# Patient Record
Sex: Male | Born: 1955 | Race: White | Hispanic: No | Marital: Single | Smoking: Former smoker
Health system: Southern US, Community
[De-identification: ages and names within clinical notes are randomized; demographics above are authoritative.]

## PROBLEM LIST (undated history)

## (undated) DIAGNOSIS — E1122 Type 2 diabetes mellitus with diabetic chronic kidney disease: Secondary | ICD-10-CM

## (undated) DIAGNOSIS — C801 Malignant (primary) neoplasm, unspecified: Secondary | ICD-10-CM

## (undated) DIAGNOSIS — C61 Malignant neoplasm of prostate: Secondary | ICD-10-CM

## (undated) DIAGNOSIS — Z993 Dependence on wheelchair: Secondary | ICD-10-CM

## (undated) DIAGNOSIS — G4733 Obstructive sleep apnea (adult) (pediatric): Secondary | ICD-10-CM

## (undated) DIAGNOSIS — I10 Essential (primary) hypertension: Secondary | ICD-10-CM

## (undated) DIAGNOSIS — E039 Hypothyroidism, unspecified: Secondary | ICD-10-CM

## (undated) DIAGNOSIS — N186 End stage renal disease: Secondary | ICD-10-CM

## (undated) DIAGNOSIS — E1140 Type 2 diabetes mellitus with diabetic neuropathy, unspecified: Secondary | ICD-10-CM

## (undated) DIAGNOSIS — I639 Cerebral infarction, unspecified: Secondary | ICD-10-CM

## (undated) DIAGNOSIS — M1990 Unspecified osteoarthritis, unspecified site: Secondary | ICD-10-CM

## (undated) DIAGNOSIS — I499 Cardiac arrhythmia, unspecified: Secondary | ICD-10-CM

## (undated) DIAGNOSIS — E785 Hyperlipidemia, unspecified: Secondary | ICD-10-CM

## (undated) DIAGNOSIS — Z9289 Personal history of other medical treatment: Secondary | ICD-10-CM

## (undated) DIAGNOSIS — D649 Anemia, unspecified: Secondary | ICD-10-CM

## (undated) DIAGNOSIS — K759 Inflammatory liver disease, unspecified: Secondary | ICD-10-CM

## (undated) DIAGNOSIS — I509 Heart failure, unspecified: Secondary | ICD-10-CM

## (undated) DIAGNOSIS — I770 Arteriovenous fistula, acquired: Secondary | ICD-10-CM

## (undated) HISTORY — PX: COLONOSCOPY: SHX174

## (undated) HISTORY — PX: APPENDECTOMY: SHX54

---
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
## 2008-06-02 DIAGNOSIS — I639 Cerebral infarction, unspecified: Secondary | ICD-10-CM

## 2008-06-02 HISTORY — DX: Cerebral infarction, unspecified: I63.9

## 2011-05-19 DIAGNOSIS — Z8673 Personal history of transient ischemic attack (TIA), and cerebral infarction without residual deficits: Secondary | ICD-10-CM | POA: Insufficient documentation

## 2012-06-07 DIAGNOSIS — H532 Diplopia: Secondary | ICD-10-CM | POA: Insufficient documentation

## 2013-04-04 DIAGNOSIS — I6789 Other cerebrovascular disease: Secondary | ICD-10-CM | POA: Insufficient documentation

## 2013-04-04 DIAGNOSIS — G4700 Insomnia, unspecified: Secondary | ICD-10-CM | POA: Insufficient documentation

## 2013-04-04 DIAGNOSIS — E7800 Pure hypercholesterolemia, unspecified: Secondary | ICD-10-CM | POA: Insufficient documentation

## 2013-04-04 DIAGNOSIS — G4761 Periodic limb movement disorder: Secondary | ICD-10-CM | POA: Insufficient documentation

## 2013-04-04 DIAGNOSIS — R0902 Hypoxemia: Secondary | ICD-10-CM | POA: Insufficient documentation

## 2013-04-04 DIAGNOSIS — R42 Dizziness and giddiness: Secondary | ICD-10-CM | POA: Insufficient documentation

## 2013-07-22 DIAGNOSIS — N183 Chronic kidney disease, stage 3 unspecified: Secondary | ICD-10-CM | POA: Insufficient documentation

## 2013-07-22 DIAGNOSIS — Z992 Dependence on renal dialysis: Secondary | ICD-10-CM | POA: Insufficient documentation

## 2013-07-22 DIAGNOSIS — N186 End stage renal disease: Secondary | ICD-10-CM | POA: Insufficient documentation

## 2014-01-09 DIAGNOSIS — E1129 Type 2 diabetes mellitus with other diabetic kidney complication: Secondary | ICD-10-CM | POA: Insufficient documentation

## 2015-04-30 DIAGNOSIS — J40 Bronchitis, not specified as acute or chronic: Secondary | ICD-10-CM | POA: Insufficient documentation

## 2015-04-30 DIAGNOSIS — T7840XA Allergy, unspecified, initial encounter: Secondary | ICD-10-CM | POA: Insufficient documentation

## 2015-04-30 DIAGNOSIS — M1990 Unspecified osteoarthritis, unspecified site: Secondary | ICD-10-CM | POA: Insufficient documentation

## 2015-04-30 DIAGNOSIS — N529 Male erectile dysfunction, unspecified: Secondary | ICD-10-CM | POA: Insufficient documentation

## 2015-04-30 DIAGNOSIS — D171 Benign lipomatous neoplasm of skin and subcutaneous tissue of trunk: Secondary | ICD-10-CM | POA: Insufficient documentation

## 2015-04-30 DIAGNOSIS — R809 Proteinuria, unspecified: Secondary | ICD-10-CM | POA: Insufficient documentation

## 2015-10-10 DIAGNOSIS — E1142 Type 2 diabetes mellitus with diabetic polyneuropathy: Secondary | ICD-10-CM | POA: Insufficient documentation

## 2016-07-14 DIAGNOSIS — Z6841 Body Mass Index (BMI) 40.0 and over, adult: Secondary | ICD-10-CM | POA: Insufficient documentation

## 2017-09-23 DIAGNOSIS — I4891 Unspecified atrial fibrillation: Secondary | ICD-10-CM | POA: Diagnosis not present

## 2017-09-23 DIAGNOSIS — R748 Abnormal levels of other serum enzymes: Secondary | ICD-10-CM | POA: Diagnosis not present

## 2017-09-23 DIAGNOSIS — J189 Pneumonia, unspecified organism: Secondary | ICD-10-CM | POA: Diagnosis not present

## 2017-09-23 DIAGNOSIS — J9601 Acute respiratory failure with hypoxia: Secondary | ICD-10-CM | POA: Diagnosis not present

## 2017-09-23 DIAGNOSIS — N289 Disorder of kidney and ureter, unspecified: Secondary | ICD-10-CM | POA: Diagnosis not present

## 2017-09-24 DIAGNOSIS — N289 Disorder of kidney and ureter, unspecified: Secondary | ICD-10-CM | POA: Diagnosis not present

## 2017-09-24 DIAGNOSIS — R748 Abnormal levels of other serum enzymes: Secondary | ICD-10-CM | POA: Diagnosis not present

## 2017-09-24 DIAGNOSIS — R0602 Shortness of breath: Secondary | ICD-10-CM | POA: Diagnosis not present

## 2017-09-24 DIAGNOSIS — J9601 Acute respiratory failure with hypoxia: Secondary | ICD-10-CM | POA: Diagnosis not present

## 2017-10-07 DIAGNOSIS — Z8679 Personal history of other diseases of the circulatory system: Secondary | ICD-10-CM | POA: Insufficient documentation

## 2017-10-07 DIAGNOSIS — I4891 Unspecified atrial fibrillation: Secondary | ICD-10-CM | POA: Insufficient documentation

## 2017-10-07 DIAGNOSIS — I482 Chronic atrial fibrillation, unspecified: Secondary | ICD-10-CM | POA: Insufficient documentation

## 2017-10-28 DIAGNOSIS — D509 Iron deficiency anemia, unspecified: Secondary | ICD-10-CM | POA: Insufficient documentation

## 2017-11-09 DIAGNOSIS — D631 Anemia in chronic kidney disease: Secondary | ICD-10-CM | POA: Insufficient documentation

## 2017-11-09 DIAGNOSIS — J9611 Chronic respiratory failure with hypoxia: Secondary | ICD-10-CM | POA: Insufficient documentation

## 2017-11-09 DIAGNOSIS — N184 Chronic kidney disease, stage 4 (severe): Secondary | ICD-10-CM | POA: Insufficient documentation

## 2018-02-09 DIAGNOSIS — I5032 Chronic diastolic (congestive) heart failure: Secondary | ICD-10-CM | POA: Insufficient documentation

## 2018-06-07 DIAGNOSIS — N186 End stage renal disease: Secondary | ICD-10-CM | POA: Insufficient documentation

## 2018-08-13 DIAGNOSIS — N186 End stage renal disease: Secondary | ICD-10-CM | POA: Insufficient documentation

## 2019-01-05 ENCOUNTER — Emergency Department (HOSPITAL_COMMUNITY): Payer: Medicare Other

## 2019-01-05 ENCOUNTER — Encounter (HOSPITAL_COMMUNITY): Payer: Self-pay | Admitting: Internal Medicine

## 2019-01-05 ENCOUNTER — Observation Stay (HOSPITAL_COMMUNITY): Payer: Medicare Other

## 2019-01-05 ENCOUNTER — Inpatient Hospital Stay (HOSPITAL_COMMUNITY)
Admission: EM | Admit: 2019-01-05 | Discharge: 2019-01-12 | DRG: 698 | Disposition: A | Discharge status: Home Health | Payer: Medicare Other | Attending: Internal Medicine | Admitting: Internal Medicine

## 2019-01-05 ENCOUNTER — Other Ambulatory Visit: Payer: Self-pay

## 2019-01-05 DIAGNOSIS — D638 Anemia in other chronic diseases classified elsewhere: Secondary | ICD-10-CM | POA: Diagnosis present

## 2019-01-05 DIAGNOSIS — D62 Acute posthemorrhagic anemia: Secondary | ICD-10-CM | POA: Diagnosis not present

## 2019-01-05 DIAGNOSIS — I480 Paroxysmal atrial fibrillation: Secondary | ICD-10-CM | POA: Diagnosis present

## 2019-01-05 DIAGNOSIS — Z79899 Other long term (current) drug therapy: Secondary | ICD-10-CM

## 2019-01-05 DIAGNOSIS — Z9989 Dependence on other enabling machines and devices: Secondary | ICD-10-CM

## 2019-01-05 DIAGNOSIS — Z87891 Personal history of nicotine dependence: Secondary | ICD-10-CM

## 2019-01-05 DIAGNOSIS — G4733 Obstructive sleep apnea (adult) (pediatric): Secondary | ICD-10-CM | POA: Diagnosis present

## 2019-01-05 DIAGNOSIS — S42402A Unspecified fracture of lower end of left humerus, initial encounter for closed fracture: Secondary | ICD-10-CM

## 2019-01-05 DIAGNOSIS — N329 Bladder disorder, unspecified: Secondary | ICD-10-CM | POA: Diagnosis not present

## 2019-01-05 DIAGNOSIS — N029 Recurrent and persistent hematuria with unspecified morphologic changes: Secondary | ICD-10-CM | POA: Diagnosis present

## 2019-01-05 DIAGNOSIS — R319 Hematuria, unspecified: Secondary | ICD-10-CM

## 2019-01-05 DIAGNOSIS — W06XXXA Fall from bed, initial encounter: Secondary | ICD-10-CM | POA: Diagnosis present

## 2019-01-05 DIAGNOSIS — R310 Gross hematuria: Secondary | ICD-10-CM | POA: Diagnosis present

## 2019-01-05 DIAGNOSIS — Z79890 Hormone replacement therapy (postmenopausal): Secondary | ICD-10-CM

## 2019-01-05 DIAGNOSIS — N1330 Unspecified hydronephrosis: Secondary | ICD-10-CM | POA: Diagnosis present

## 2019-01-05 DIAGNOSIS — Z833 Family history of diabetes mellitus: Secondary | ICD-10-CM

## 2019-01-05 DIAGNOSIS — I509 Heart failure, unspecified: Secondary | ICD-10-CM | POA: Diagnosis present

## 2019-01-05 DIAGNOSIS — I10 Essential (primary) hypertension: Secondary | ICD-10-CM | POA: Diagnosis present

## 2019-01-05 DIAGNOSIS — E7800 Pure hypercholesterolemia, unspecified: Secondary | ICD-10-CM | POA: Diagnosis present

## 2019-01-05 DIAGNOSIS — N3289 Other specified disorders of bladder: Secondary | ICD-10-CM | POA: Diagnosis present

## 2019-01-05 DIAGNOSIS — C7951 Secondary malignant neoplasm of bone: Secondary | ICD-10-CM | POA: Diagnosis present

## 2019-01-05 DIAGNOSIS — N400 Enlarged prostate without lower urinary tract symptoms: Secondary | ICD-10-CM | POA: Diagnosis present

## 2019-01-05 DIAGNOSIS — E039 Hypothyroidism, unspecified: Secondary | ICD-10-CM | POA: Diagnosis present

## 2019-01-05 DIAGNOSIS — Z992 Dependence on renal dialysis: Secondary | ICD-10-CM

## 2019-01-05 DIAGNOSIS — D649 Anemia, unspecified: Secondary | ICD-10-CM | POA: Diagnosis not present

## 2019-01-05 DIAGNOSIS — Z09 Encounter for follow-up examination after completed treatment for conditions other than malignant neoplasm: Secondary | ICD-10-CM

## 2019-01-05 DIAGNOSIS — N3944 Nocturnal enuresis: Secondary | ICD-10-CM | POA: Diagnosis present

## 2019-01-05 DIAGNOSIS — E8889 Other specified metabolic disorders: Secondary | ICD-10-CM | POA: Diagnosis present

## 2019-01-05 DIAGNOSIS — S50312A Abrasion of left elbow, initial encounter: Secondary | ICD-10-CM | POA: Diagnosis present

## 2019-01-05 DIAGNOSIS — E1122 Type 2 diabetes mellitus with diabetic chronic kidney disease: Secondary | ICD-10-CM | POA: Diagnosis present

## 2019-01-05 DIAGNOSIS — K5909 Other constipation: Secondary | ICD-10-CM | POA: Diagnosis present

## 2019-01-05 DIAGNOSIS — N186 End stage renal disease: Secondary | ICD-10-CM | POA: Diagnosis present

## 2019-01-05 DIAGNOSIS — Z8673 Personal history of transient ischemic attack (TIA), and cerebral infarction without residual deficits: Secondary | ICD-10-CM

## 2019-01-05 DIAGNOSIS — R42 Dizziness and giddiness: Secondary | ICD-10-CM | POA: Diagnosis not present

## 2019-01-05 DIAGNOSIS — Z20828 Contact with and (suspected) exposure to other viral communicable diseases: Secondary | ICD-10-CM | POA: Diagnosis present

## 2019-01-05 DIAGNOSIS — Z823 Family history of stroke: Secondary | ICD-10-CM

## 2019-01-05 DIAGNOSIS — K921 Melena: Secondary | ICD-10-CM | POA: Diagnosis present

## 2019-01-05 DIAGNOSIS — Z794 Long term (current) use of insulin: Secondary | ICD-10-CM

## 2019-01-05 DIAGNOSIS — Z6836 Body mass index (BMI) 36.0-36.9, adult: Secondary | ICD-10-CM

## 2019-01-05 DIAGNOSIS — K2970 Gastritis, unspecified, without bleeding: Secondary | ICD-10-CM | POA: Diagnosis present

## 2019-01-05 DIAGNOSIS — I959 Hypotension, unspecified: Secondary | ICD-10-CM | POA: Diagnosis present

## 2019-01-05 DIAGNOSIS — R160 Hepatomegaly, not elsewhere classified: Secondary | ICD-10-CM | POA: Diagnosis present

## 2019-01-05 DIAGNOSIS — I132 Hypertensive heart and chronic kidney disease with heart failure and with stage 5 chronic kidney disease, or end stage renal disease: Secondary | ICD-10-CM | POA: Diagnosis present

## 2019-01-05 DIAGNOSIS — N2581 Secondary hyperparathyroidism of renal origin: Secondary | ICD-10-CM | POA: Diagnosis present

## 2019-01-05 DIAGNOSIS — E785 Hyperlipidemia, unspecified: Secondary | ICD-10-CM | POA: Diagnosis present

## 2019-01-05 HISTORY — DX: Hyperlipidemia, unspecified: E78.5

## 2019-01-05 HISTORY — DX: Essential (primary) hypertension: I10

## 2019-01-05 HISTORY — DX: Type 2 diabetes mellitus with diabetic chronic kidney disease: E11.22

## 2019-01-05 HISTORY — DX: Obstructive sleep apnea (adult) (pediatric): G47.33

## 2019-01-05 HISTORY — DX: Hypothyroidism, unspecified: E03.9

## 2019-01-05 HISTORY — DX: End stage renal disease: N18.6

## 2019-01-05 LAB — PREPARE RBC (CROSSMATCH)

## 2019-01-05 LAB — BASIC METABOLIC PANEL
Anion gap: 17 — ABNORMAL HIGH (ref 5–15)
BUN: 43 mg/dL — ABNORMAL HIGH (ref 8–23)
CO2: 18 mmol/L — ABNORMAL LOW (ref 22–32)
Calcium: 8 mg/dL — ABNORMAL LOW (ref 8.9–10.3)
Chloride: 98 mmol/L (ref 98–111)
Creatinine, Ser: 6.98 mg/dL — ABNORMAL HIGH (ref 0.61–1.24)
GFR calc Af Amer: 9 mL/min — ABNORMAL LOW (ref >60)
GFR calc non Af Amer: 8 mL/min — ABNORMAL LOW (ref >60)
Glucose, Bld: 255 mg/dL — ABNORMAL HIGH (ref 70–99)
Potassium: 4.9 mmol/L (ref 3.5–5.1)
Sodium: 133 mmol/L — ABNORMAL LOW (ref 135–145)

## 2019-01-05 LAB — GLUCOSE, CAPILLARY: Glucose-Capillary: 187 mg/dL — ABNORMAL HIGH (ref 70–99)

## 2019-01-05 LAB — CBC WITH DIFFERENTIAL/PLATELET
Abs Immature Granulocytes: 0.24 10*3/uL — ABNORMAL HIGH (ref 0.00–0.07)
Basophils Absolute: 0 10*3/uL (ref 0.0–0.1)
Basophils Relative: 0 %
Eosinophils Absolute: 0 10*3/uL (ref 0.0–0.5)
Eosinophils Relative: 0 %
HCT: 22.3 % — ABNORMAL LOW (ref 39.0–52.0)
Hemoglobin: 6.8 g/dL — CL (ref 13.0–17.0)
Immature Granulocytes: 1 %
Lymphocytes Relative: 6 %
Lymphs Abs: 1 10*3/uL (ref 0.7–4.0)
MCH: 31.1 pg (ref 26.0–34.0)
MCHC: 30.5 g/dL (ref 30.0–36.0)
MCV: 101.8 fL — ABNORMAL HIGH (ref 80.0–100.0)
Monocytes Absolute: 0.9 10*3/uL (ref 0.1–1.0)
Monocytes Relative: 6 %
Neutro Abs: 14.4 10*3/uL — ABNORMAL HIGH (ref 1.7–7.7)
Neutrophils Relative %: 87 %
Platelets: 223 10*3/uL (ref 150–400)
RBC: 2.19 MIL/uL — ABNORMAL LOW (ref 4.22–5.81)
RDW: 15.5 % (ref 11.5–15.5)
WBC: 16.6 10*3/uL — ABNORMAL HIGH (ref 4.0–10.5)
nRBC: 0 % (ref 0.0–0.2)

## 2019-01-05 LAB — TROPONIN I (HIGH SENSITIVITY): Troponin I (High Sensitivity): 22 ng/L — ABNORMAL HIGH (ref <18)

## 2019-01-05 LAB — CBG MONITORING, ED: Glucose-Capillary: 143 mg/dL — ABNORMAL HIGH (ref 70–99)

## 2019-01-05 LAB — POC OCCULT BLOOD, ED: Fecal Occult Bld: POSITIVE — AB

## 2019-01-05 LAB — HEPATIC FUNCTION PANEL
ALT: <5 U/L (ref 0–44)
AST: 25 U/L (ref 15–41)
Albumin: 2.7 g/dL — ABNORMAL LOW (ref 3.5–5.0)
Alkaline Phosphatase: 114 U/L (ref 38–126)
Bilirubin, Direct: 0.4 mg/dL — ABNORMAL HIGH (ref 0.0–0.2)
Indirect Bilirubin: 0.7 mg/dL (ref 0.3–0.9)
Total Bilirubin: 1.1 mg/dL (ref 0.3–1.2)
Total Protein: 5.9 g/dL — ABNORMAL LOW (ref 6.5–8.1)

## 2019-01-05 LAB — SARS CORONAVIRUS 2 BY RT PCR (HOSPITAL ORDER, PERFORMED IN ~~LOC~~ HOSPITAL LAB): SARS Coronavirus 2: NEGATIVE

## 2019-01-05 LAB — ABO/RH: ABO/RH(D): A POS

## 2019-01-05 MED ORDER — INSULIN ASPART 100 UNIT/ML ~~LOC~~ SOLN
0.0000 [IU] | Freq: Three times a day (TID) | SUBCUTANEOUS | Status: DC
  Administered 2019-01-06: 1 [IU] via SUBCUTANEOUS
  Administered 2019-01-06 – 2019-01-08 (×2): 2 [IU] via SUBCUTANEOUS
  Administered 2019-01-09: 1 [IU] via SUBCUTANEOUS
  Administered 2019-01-09: 2 [IU] via SUBCUTANEOUS
  Administered 2019-01-10: 1 [IU] via SUBCUTANEOUS
  Administered 2019-01-10: 3 [IU] via SUBCUTANEOUS
  Administered 2019-01-10 – 2019-01-11 (×2): 1 [IU] via SUBCUTANEOUS
  Administered 2019-01-11: 3 [IU] via SUBCUTANEOUS
  Administered 2019-01-12: 1 [IU] via SUBCUTANEOUS

## 2019-01-05 MED ORDER — HYDROXYZINE HCL 25 MG PO TABS: 25.0000 mg | ORAL_TABLET | Freq: Three times a day (TID) | ORAL | Status: DC | PRN

## 2019-01-05 MED ORDER — SODIUM CHLORIDE 0.9% FLUSH
3.0000 mL | Freq: Two times a day (BID) | INTRAVENOUS | Status: DC
  Administered 2019-01-05 – 2019-01-11 (×9): 3 mL via INTRAVENOUS

## 2019-01-05 MED ORDER — CALCIUM CARBONATE ANTACID 1250 MG/5ML PO SUSP
500.0000 mg | Freq: Four times a day (QID) | ORAL | Status: DC | PRN
  Filled 2019-01-05: qty 5

## 2019-01-05 MED ORDER — ZOLPIDEM TARTRATE 5 MG PO TABS
5.0000 mg | ORAL_TABLET | Freq: Every evening | ORAL | Status: DC | PRN
  Administered 2019-01-07 – 2019-01-12 (×5): 5 mg via ORAL
  Filled 2019-01-05 (×6): qty 1

## 2019-01-05 MED ORDER — SODIUM CHLORIDE 0.9% IV SOLUTION: Freq: Once | INTRAVENOUS | Status: DC

## 2019-01-05 MED ORDER — SORBITOL 70 % SOLN: 30.0000 mL | Status: DC | PRN

## 2019-01-05 MED ORDER — ACETAMINOPHEN 325 MG PO TABS
650.0000 mg | ORAL_TABLET | Freq: Four times a day (QID) | ORAL | Status: DC | PRN
  Administered 2019-01-06 – 2019-01-11 (×5): 650 mg via ORAL
  Filled 2019-01-05 (×4): qty 2

## 2019-01-05 MED ORDER — HYDRALAZINE HCL 25 MG PO TABS
50.0000 mg | ORAL_TABLET | Freq: Three times a day (TID) | ORAL | Status: DC
  Administered 2019-01-05 – 2019-01-12 (×18): 50 mg via ORAL
  Filled 2019-01-05 (×18): qty 2

## 2019-01-05 MED ORDER — ONDANSETRON HCL 4 MG PO TABS: 4.0000 mg | ORAL_TABLET | Freq: Four times a day (QID) | ORAL | Status: DC | PRN

## 2019-01-05 MED ORDER — NEPRO/CARBSTEADY PO LIQD: 237.0000 mL | Freq: Three times a day (TID) | ORAL | Status: DC | PRN

## 2019-01-05 MED ORDER — ISOSORBIDE MONONITRATE ER 60 MG PO TB24
60.0000 mg | ORAL_TABLET | Freq: Every day | ORAL | Status: DC
  Administered 2019-01-06 – 2019-01-12 (×6): 60 mg via ORAL
  Filled 2019-01-05 (×2): qty 1
  Filled 2019-01-05: qty 2
  Filled 2019-01-05 (×4): qty 1

## 2019-01-05 MED ORDER — AMITRIPTYLINE HCL 50 MG PO TABS
100.0000 mg | ORAL_TABLET | Freq: Every day | ORAL | Status: DC
  Administered 2019-01-05 – 2019-01-11 (×7): 100 mg via ORAL
  Filled 2019-01-05 (×2): qty 2
  Filled 2019-01-05 (×2): qty 4
  Filled 2019-01-05 (×3): qty 2

## 2019-01-05 MED ORDER — LEVOTHYROXINE SODIUM 50 MCG PO TABS
50.0000 ug | ORAL_TABLET | Freq: Every day | ORAL | Status: DC
  Administered 2019-01-06 – 2019-01-12 (×5): 50 ug via ORAL
  Filled 2019-01-05 (×6): qty 1

## 2019-01-05 MED ORDER — INSULIN ASPART 100 UNIT/ML ~~LOC~~ SOLN
0.0000 [IU] | Freq: Every day | SUBCUTANEOUS | Status: DC
  Administered 2019-01-06 – 2019-01-08 (×2): 2 [IU] via SUBCUTANEOUS

## 2019-01-05 MED ORDER — ONDANSETRON HCL 4 MG/2ML IJ SOLN: 4.0000 mg | Freq: Four times a day (QID) | INTRAMUSCULAR | Status: DC | PRN

## 2019-01-05 MED ORDER — SODIUM CHLORIDE 0.9 % IV SOLN: 10.0000 mL/h | Freq: Once | INTRAVENOUS | Status: DC

## 2019-01-05 MED ORDER — ACETAMINOPHEN 650 MG RE SUPP: 650.0000 mg | Freq: Four times a day (QID) | RECTAL | Status: DC | PRN

## 2019-01-05 MED ORDER — PANTOPRAZOLE SODIUM 40 MG IV SOLR
40.0000 mg | Freq: Once | INTRAVENOUS | Status: AC
  Administered 2019-01-05: 40 mg via INTRAVENOUS
  Filled 2019-01-05: qty 40

## 2019-01-05 MED ORDER — ATORVASTATIN CALCIUM 40 MG PO TABS
40.0000 mg | ORAL_TABLET | Freq: Every day | ORAL | Status: DC
  Administered 2019-01-06 – 2019-01-12 (×6): 40 mg via ORAL
  Filled 2019-01-05 (×7): qty 1

## 2019-01-05 MED ORDER — DOCUSATE SODIUM 283 MG RE ENEM
1.0000 | ENEMA | RECTAL | Status: DC | PRN
  Filled 2019-01-05: qty 1

## 2019-01-05 MED ORDER — INSULIN GLARGINE 100 UNIT/ML ~~LOC~~ SOLN
20.0000 [IU] | Freq: Every day | SUBCUTANEOUS | Status: DC
  Administered 2019-01-05 – 2019-01-11 (×7): 20 [IU] via SUBCUTANEOUS
  Filled 2019-01-05 (×8): qty 0.2

## 2019-01-05 MED ORDER — CAMPHOR-MENTHOL 0.5-0.5 % EX LOTN
1.0000 "application " | TOPICAL_LOTION | Freq: Three times a day (TID) | CUTANEOUS | Status: DC | PRN
  Filled 2019-01-05: qty 222

## 2019-01-05 MED ORDER — DULOXETINE HCL 30 MG PO CPEP
30.0000 mg | ORAL_CAPSULE | Freq: Every day | ORAL | Status: DC
  Administered 2019-01-06 – 2019-01-12 (×6): 30 mg via ORAL
  Filled 2019-01-05 (×7): qty 1

## 2019-01-05 NOTE — Consult Note (Addendum)
[facility] Consult: 3:21 PM 01/05/2019  LOS: 0 days    Referring Provider: Dr Ronnald Nian in ED  Primary Care Physician:  [name], MD  In [facility] Primary Gastroenterologist:  Althia Forts.   Joylene Draft, PA-C of [facility].  Janus Molder, MD Hematologist:  Dorann Lodge, MD in [place].    Reason for Consultation:  Anemia.  Black, stool.    HPI: Richard Hall is a 63 y.o. male.  Gets his usual care in [place].  ESRD on HD. Anemia, multifactorial including kidney disease/anemia of chronic disease per hematology.  CHF.  Atrial fibrillation.  Hypothyroidism.  Morbid obesity.  DM 2.  OSA Has been getting treatment with parenteral iron through his renal physicians.  Chronic constipation, Linzess trial in notes of 05/16/2018.  10/27/2017 EGD: Minimal gastritis 10/29/2017 colonoscopy normal.  Note this was a repeat studies 2 days after colonoscopy attempt at which time MD was unable to reach the cecum due to an adequate prep. 01/2018 VCE.  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  Hepatitis B surface g, hepatitis B surface Ab, hepatitis B core Ab, HCV Ab were all negative in 06/2018  For at least 2 weeks having issues with urinary retention.  Last Thursday he underwent an ultrasound and "4 L" of urine was noted.  He did not have a Foley catheter placed.  Yesterday after dialysis he was sent to the [facility] ED where Foley catheter was placed.  It filled "6 urinals" full of yellow urine.  Catheter left in place.  Once he got home he started noticing frank blood draining into the ambulatory urinal collection system.  Blood-filled the bag  (unknown volume) 3 times, as it was draining for the fourth time, it stopped draining, seemed like clots were obstructing drainage of the urine.  Today he became dizzy, near syncopal and fell from the bed to the floor with minor abrasion to his left elbow.  Per EMS systolic BPs in the 62M-35D.  Received 500 cc fluid bolus.  Hgb 6.8, it was 11.7 on 08/10/2018 and runs anywhere from 9.5 to 11.5.  MCV is 101.  He gets fairly regular doses of intravenous iron at dialysis and says his stools have been greenish-black since he started dialysis in January.  In care everywhere there are several iron/anemia profiles, the latest was 08/10/2019 all iron parameters were normal and his ferritin was 538.  He has had previous low iron saturations.  Has not needed to use Linzess in over a month, he uses it as needed.  X-ray of his left elbow is showing nondisplaced, small fracture of the coronoid process    His appetite is good.  No nausea, vomiting, dysphagia, indigestion, abd nominal pain.  No EtOH or illicit drugs. Family history of "cancer" in his mother.  Diabetes, stroke, heart disease in siblings.     Prior to Admission medications   Not on File  Patient does not have a list of his meds with him. Copying a list from a [facility] visit of 11/24/2018 as follows  ACCU-CHEK FASTCLIX LANCETS Misc USE 1 TO CHECK BLOOD SUGAR TWICE DAILY  amitriptyline HCl 100 mg tablet Commonly known as: ELAVIL TAKE 1 TABLET BY MOUTH AT BEDTIME  atorvastatin 40 mg tablet Commonly known as: LIPITOR 40 mg, Oral, Daily  BD PEN NEEDLE MINI U/F 31G X 5 MM Misc Generic drug: Insulin Pen Needle USE AS DIRECTED PER DOCTOR TWICE DAILY  DULoxetine HCl 30 mg capsule Commonly known as: CYMBALTA Take 1 capsule by mouth once daily  enalapril maleate 20 mg tablet Commonly known as: VASOTEC 20 mg, Oral, Daily  fluticasone propionate 50 mcg/actuation nasal spray Commonly known as: FLONASE 1 spray, Nasal, Every 24 hours  as needed  gemfibrozil 600 MG tablet Commonly known as: LOPID Take 1 tablet by mouth twice daily  glipiZIDE 10 mg tablet Commonly known as: GLUCOTROL 10 mg, Oral, Daily  glucose blood test strip Commonly known as: ACCU-CHEK AVIVA PLUS USE 1 STRIP TO CHECK GLUCOSE THREE TIMES DAILY AS DIRECTED  hydrALAZINE HCl 100 mg tablet Commonly known as: APRESOLINE TAKE 1/2 (ONE-HALF) TABLET BY MOUTH THREE TIMES DAILY  iron polysaccharides 150 MG capsule Commonly known as: HYTINIC,FERREX,IFEREX,POLY-IRON 150 mg, Oral, Daily  isosorbide mononitrate 60 mg 24 hr tablet Commonly known as: IMDUR Take 1 tablet by mouth once daily  LANTUS SOLOSTAR 100 units/mL pen Generic drug: insulin glargine 40 Units, Subcutaneous, Daily  linaclotide 72 mcg capsule Commonly known as: LINZESS 72 mcg, Oral, Daily  lubiprostone 24 mcg capsule Commonly known as: AMITIZA 24 mcg, Oral, As needed  metolazone 5 mg tablet Commonly known as: ZAROXOLYN 5 mg, Oral, Daily, Takes only five days a week Monday thru Friday only  metoprolol tartrate 25 mg tablet Commonly known as: LOPRESSOR 25 mg, Oral, Daily  NIFEdipine 90 mg 24 hr tablet Commonly known as: ADALAT CC/PROCARDIA XL 90 mg, Oral, 30 minutes before breakfast  OXYGEN 2 L, Inhalation, As needed  Respiratory Therapy Supplies Devi CPAP at bedtime  sodium bicarbonate 650 mg tablet 650 mg, Oral, 2 times a day   Modified Medications  Instructions  darbepoetin 40 mcg/mL injection Commonly known as: ARANESP What changed: when to take this 40 mcg, Subcutaneous, Every 7 days  furosemide 80 mg tablet Commonly known as: LASIX What changed:   how much to take  additional instructions 80 mg, Oral, Daily  levothyroxine sodium 50 mcg tablet Commonly known as: SYNTHROID,LEVOTHROID,LOVOXYL What changed:   how much to take  how to take this  when to take this  additional instructions TAKE 1 TABLET BY MOUTH ONCE DAILY  Scheduled Meds: .  sodium chloride   Intravenous Once  . pantoprazole (PROTONIX) IV  40 mg Intravenous Once   Infusions: . sodium chloride     PRN Meds:    Allergies as of 01/05/2019  . (Not on File)    No family history on file.  Social History   Socioeconomic History  . Marital status: Single    Spouse name: Not on file  . Number of children: Not on file  . Years of education: Not on file  . Highest education level: Not on file  Occupational History  . Not on file  Social Needs  . Financial resource strain: Not on file  . Food insecurity    Worry: Not on file    Inability: Not on file  . Transportation needs    Medical:  Not on file    Non-medical: Not on file  Tobacco Use  . Smoking status: Not on file  Substance and Sexual Activity  . Alcohol use: Not on file  . Drug use: Not on file  . Sexual activity: Not on file  Lifestyle  . Physical activity    Days per week: Not on file    Minutes per session: Not on file  . Stress: Not on file  Relationships  . Social Herbalist on phone: Not on file    Gets together: Not on file    Attends religious service: Not on file    Active member of club or organization: Not on file    Attends meetings of clubs or organizations: Not on file    Relationship status: Not on file  . Intimate partner violence    Fear of current or ex partner: Not on file    Emotionally abused: Not on file    Physically abused: Not on file    Forced sexual activity: Not on file  Other Topics Concern  . Not on file  Social History Narrative  . Not on file    REVIEW OF SYSTEMS: Constitutional: Generally not lacking energy.  Today he feels weak. ENT:  No nose bleeds Pulm: No shortness of breath.  No cough. CV:  No palpitations, no LE edema.  No chest pain. GU: Abundant gross hematuria as per HPI.   GI: See HPI Heme: Other than the urinary bleeding, has not had any unusual or excessive bleeding/bruising. Transfusions: None. Neuro:  No headaches,  no peripheral tingling or numbness.  Syncope as per HPI. Derm:  No itching, no rash or sores.  Endocrine:  No sweats or chills.  No polyuria or dysuria Immunization: Reviewed records from [facility] everywhere notes. Travel:  None beyond local counties in last few months.    PHYSICAL EXAM: Vital signs in last 24 hours: Vitals:   01/05/19 1503 01/05/19 1518  BP: (!) 87/50 (!) 109/54  Pulse: 78 78  Resp: 20 (!) 22  Temp: 97.7 F (36.5 C) 98.1 F (36.7 C)  SpO2:     Wt Readings from Last 3 Encounters:  No data found for Wt    General: Obese, chronically unwell looking pleasant, comfortable, alert. Head: No facial asymmetry or swelling.  No signs of head trauma. Eyes: No conjunctival pallor.  EOMI. Ears: Not hard of hearing Nose: No discharge or congestion Mouth: Mucous membranes moist, pink, clear.  No upper teeth and just a few lower teeth remain.  His full upper denture and lower partial denture are at home. Neck: No JVD, no masses, no thyromegaly. Lungs: Clear bilaterally.  No labored breathing or cough Heart: RRR.  No MRG.  S1, S2 present. Abdomen: Obese.  Soft.  Active bowel sounds.  No HSM, masses, bruits, hernias.  No tenderness..   GU: Grossly bloody urine in Foley catheter bag Rectal: Greenish, black, soft stool.  FOBT negative. Musc/Skeltl: No joint redness, swelling, significant deformities. Extremities: Slight, nonpitting pedal edema. Neurologic: Alert.  Oriented x3.  Is all 4 limbs, strength not tested.  No tremor or gross weakness/deficits. Skin: No rashes, no sores, no telangiectasia. Tattoos: None observed Nodes: No cervical adenopathy. Psych: Pleasant, calm, fluid speech.  Intake/Output from previous day: No intake/output data recorded. Intake/Output this shift: Total I/O In: 315 [Blood:315] Out: -   LAB RESULTS: Recent Labs    01/05/19 1340  WBC 16.6*  HGB 6.8*  HCT 22.3*  PLT 223   BMET Lab Results  Component Value Date   NA 133 (L)  01/05/2019   K 4.9 01/05/2019   CL 98 01/05/2019   CO2 18 (L) 01/05/2019   GLUCOSE 255 (H) 01/05/2019   BUN 43 (H) 01/05/2019   CREATININE 6.98 (H) 01/05/2019   CALCIUM 8.0 (L) 01/05/2019   LFT Recent Labs    01/05/19 1340  PROT 5.9*  ALBUMIN 2.7*  AST 25  ALT <5  ALKPHOS 114  BILITOT 1.1  BILIDIR 0.4*  IBILI 0.7   PT/INR No results found for: INR, PROTIME    RADIOLOGY STUDIES: Dg Chest 2 View  Result Date: 01/05/2019 CLINICAL DATA:  Weakness, pain, fall EXAM: CHEST - 2 VIEW COMPARISON:  Chest radiograph 08/30/2018, chest CT 09/23/2017 FINDINGS: Accounting for body habitus, the lungs are clear. No consolidation, features of edema, pneumothorax, or effusion. Pulmonary vascularity is normally distributed. The cardiomediastinal contours are stable from prior studies. No acute osseous or soft tissue abnormality. Degenerative changes are present in the and imaged spine and shoulders. IMPRESSION: No active cardiopulmonary disease or acute traumatic abnormality. Electronically Signed   By: Lovena Le M.D.   On: 01/05/2019 14:33   Dg Elbow Complete Left  Result Date: 01/05/2019 CLINICAL DATA:  Fall, weakness, left elbow abrasion EXAM: LEFT ELBOW - COMPLETE 3+ VIEW COMPARISON:  None. FINDINGS: Small nondisplaced fracture of the coronoid process. Small left elbow joint effusion is present. There is circumferential soft tissue swelling of the elbow. No additional fracture or traumatic malalignment. Vascular calcium is seen in the soft tissues. IMPRESSION: Small nondisplaced fracture of the coronoid process. Electronically Signed   By: Lovena Le M.D.   On: 01/05/2019 14:37   Dg Knee Complete 4 Views Left  Result Date: 01/05/2019 CLINICAL DATA:  Weakness, pain, fall. Left knee pain. EXAM: LEFT KNEE - COMPLETE 4+ VIEW COMPARISON:  None. FINDINGS: Severe tricompartmental osteoarthrosis most pronounced in the medial femorotibial compartment where there is bone-on-bone contact though  evaluation of the alignment is limited on nonweightbearing views. No acute fracture or traumatic malalignment. The lateral radiograph is suboptimally projected limiting evaluation for small effusion. No large effusion is seen. Minimal soft tissue thickening anterior to the knee. Vascular calcium seen posteriorly. IMPRESSION: Severe tricompartmental osteoarthrosis. No definite acute osseous abnormality. Electronically Signed   By: Lovena Le M.D.   On: 01/05/2019 14:35      IMPRESSION:   *    Acute blood loss anemia.  Suspect the blood loss is not from the stools but from his urologic source.  *    Obstructive uropathy with gross hematuria following initially nonbloody urine output after placement of Foley catheter at [facility] ED yesterday.  Needs urologic consult and I do not think it can wait until his pending appointment next Wednesday.  *     ESRD.  On dialysis since 06/2018.  *    Normal colonoscopy, minor gastritis on EGD, colonoscopy in late May 2019. 1 SB AVM on capsule endoscopy 01/2018.  Has subsequently been evaluated by GI in the office and pursuing enteroscopy never mentioned in the notes Stool is currently greenish, dark and FOBT negative for me on DRE today.  Since the dark stools started when he started dialysis, ? Is he taking Auryxia (ferrous citrate) that may be cause of dark stool?   Given the degree of dark color in the stool, would expect the stool to be very quickly FOBT positive which it is not.   PLAN:     *  No plans for any endoscopic or colonoscopic work-up.  Does not need IV PPI. Given history of gastritis, would add Protonix 40 mg p.o./day  *     Needs urologic input to locate and treat the source of his gross hematuria and urinary obstruction   Azucena Freed  01/05/2019, 3:21 PM Phone [phone]  Addendum: I have taken an interval history, reviewed the chart, and examined the patient. I agree with the Advanced Practitioner's note and impression.  63 yo  with acute anemia due to blood loss. Hx ESRD on HD, CHF, a fib, diabetes.  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   --Had extensive GI workup/endoscopies in May 2019 and Sept 2019. --Stool heme negative when tested today by Azucena Freed, PA-C.  With gross hematuria, recent GI eval and heme neg stools, no plans for repeat endoscopy. Urology has seen patient and will be evaluating and treating the hematuria.  GI consult team will sign off, call with questions.

## 2019-01-05 NOTE — Consult Note (Signed)
Urology Consult  Referring physician: Elpidio Anis Reason for referral: Hematuria  Chief Complaint: Hematuria  History of Present Illness: End stage renal; on dialysis; Er with urology with foley yesterday [place]; no blood thinner but now gross hematuria  Hb 6.8 recently re 10.6; Cr 6.98; has dark stool chronic and GI saw pt  Reports slower flow and incomplete emptying chrnoically and make a lot of urine daily; two week hx of bedwetting; said they got 4 liters with foley  Modifying factors: There are no other modifying factors  Associated signs and symptoms: There are no other associated signs and symptoms Aggravating and relieving factors: There are no other aggravating or relieving factors Severity: Moderate Duration: Persistent      Past Medical History:  Diagnosis Date  . CVA (cerebral vascular accident) ([place]) 2010  . Diabetes mellitus with end-stage renal disease ([place])   . Dyslipidemia   . Essential hypertension   . Hypothyroidism (acquired)   . OSA on CPAP    Past Surgical History:  Procedure Laterality Date  . APPENDECTOMY      Medications: I have reviewed the patient's current medications. Allergies: Not on File  History reviewed. No pertinent family history. Social History:  reports that he quit smoking about 23 years ago. He has never used smokeless tobacco. He reports that he does not drink alcohol or use drugs.  ROS: All systems are reviewed and negative except as noted. Rest negative  Physical Exam:  Vital signs in last 24 hours: Temp:  [97.7 F (36.5 C)-98.1 F (36.7 C)] 98.1 F (36.7 C) (08/05 1518) Pulse Rate:  [73-78] 77 (08/05 1530) Resp:  [18-22] 21 (08/05 1530) BP: (87-109)/(50-67) 106/67 (08/05 1530) SpO2:  [99 %] 99 % (08/05 1530)  Cardiovascular: Skin warm; not flushed Respiratory: Breaths quiet; no shortness of breath Abdomen: No masses Neurological: Normal sensation to touch Musculoskeletal: Normal motor function arms and  legs Lymphatics: No inguinal adenopathy Skin: No rashes Genitourinary:obese; nos/p tender; bag draining bloody urine with few clots  Laboratory Data:  Results for orders placed or performed during the hospital encounter of 01/05/19 (from the past 72 hour(s))  Basic metabolic panel     Status: Abnormal   Collection Time: 01/05/19  1:40 PM  Result Value Ref Range   Sodium 133 (L) 135 - 145 mmol/L   Potassium 4.9 3.5 - 5.1 mmol/L    Comment: SLIGHT HEMOLYSIS   Chloride 98 98 - 111 mmol/L   CO2 18 (L) 22 - 32 mmol/L   Glucose, Bld 255 (H) 70 - 99 mg/dL   BUN 43 (H) 8 - 23 mg/dL   Creatinine, Ser 6.98 (H) 0.61 - 1.24 mg/dL   Calcium 8.0 (L) 8.9 - 10.3 mg/dL   GFR calc non Af Amer 8 (L) >60 mL/min   GFR calc Af Amer 9 (L) >60 mL/min   Anion gap 17 (H) 5 - 15    Comment: Performed at [facility], [address] [place], [place] [address]  CBC WITH DIFFERENTIAL     Status: Abnormal   Collection Time: 01/05/19  1:40 PM  Result Value Ref Range   WBC 16.6 (H) 4.0 - 10.5 K/uL   RBC 2.19 (L) 4.22 - 5.81 MIL/uL   Hemoglobin 6.8 (LL) 13.0 - 17.0 g/dL    Comment: REPEATED TO VERIFY THIS CRITICAL RESULT HAS VERIFIED AND BEEN CALLED TO MIKE PETERS RN BY SHANNON FLEMING ON 08 05 2020 AT 0100, AND HAS BEEN READ BACK.  HCT 22.3 (L) 39.0 - 52.0 %   MCV 101.8 (H) 80.0 - 100.0 fL   MCH 31.1 26.0 - 34.0 pg   MCHC 30.5 30.0 - 36.0 g/dL   RDW 15.5 11.5 - 15.5 %   Platelets 223 150 - 400 K/uL   nRBC 0.0 0.0 - 0.2 %   Neutrophils Relative % 87 %   Neutro Abs 14.4 (H) 1.7 - 7.7 K/uL   Lymphocytes Relative 6 %   Lymphs Abs 1.0 0.7 - 4.0 K/uL   Monocytes Relative 6 %   Monocytes Absolute 0.9 0.1 - 1.0 K/uL   Eosinophils Relative 0 %   Eosinophils Absolute 0.0 0.0 - 0.5 K/uL   Basophils Relative 0 %   Basophils Absolute 0.0 0.0 - 0.1 K/uL   Immature Granulocytes 1 %   Abs Immature Granulocytes 0.24 (H) 0.00 - 0.07 K/uL    Comment: Performed at [facility] [address]  [place], [place] [address]  Type and screen [place]     Status: None (Preliminary result)   Collection Time: 01/05/19  1:40 PM  Result Value Ref Range   ABO/RH(D) A POS    Antibody Screen NEG    Sample Expiration 01/08/2019,2359    Unit Number [id number]    Blood Component Type RED CELLS,LR    Unit division 00    Status of Unit ISSUED    Transfusion Status OK TO TRANSFUSE    Crossmatch Result      Compatible Performed at [facility], [place] [address], [place], [place] [address]   Troponin I (High Sensitivity)     Status: Abnormal   Collection Time: 01/05/19  1:40 PM  Result Value Ref Range   Troponin I (High Sensitivity) 22 (H) <18 ng/L    Comment: (NOTE) Elevated high sensitivity troponin I (hsTnI) values and significant  changes across serial measurements may suggest ACS but many other  chronic and acute conditions are known to elevate hsTnI results.  Refer to the Links section for chest pain algorithms and additional  guidance. Performed at [facility], [place] [address] [place], [place] [address]   Hepatic function panel     Status: Abnormal   Collection Time: 01/05/19  1:40 PM  Result Value Ref Range   Total Protein 5.9 (L) 6.5 - 8.1 g/dL   Albumin 2.7 (L) 3.5 - 5.0 g/dL   AST 25 15 - 41 U/L   ALT <5 0 - 44 U/L   Alkaline Phosphatase 114 38 - 126 U/L   Total Bilirubin 1.1 0.3 - 1.2 mg/dL   Bilirubin, Direct 0.4 (H) 0.0 - 0.2 mg/dL   Indirect Bilirubin 0.7 0.3 - 0.9 mg/dL    Comment: Performed at [facility] [address] [place], [place] [address]  ABO/Rh     Status: None   Collection Time: 01/05/19  1:40 PM  Result Value Ref Range   ABO/RH(D)      A POS Performed at [facility] [address], [place], [place] [address]   Prepare RBC     Status: None   Collection Time: 01/05/19  2:32 PM  Result Value Ref Range   Order Confirmation      ORDER PROCESSED BY BLOOD BANK Performed at [facility], [place]  [address], [place], [place] [address]   POC occult blood, ED     Status: Abnormal   Collection Time: 01/05/19  2:34 PM  Result Value Ref Range   Fecal Occult Bld POSITIVE (A) NEGATIVE  Prepare RBC     Status: None   Collection Time: 01/05/19  3:00 PM  Result Value Ref Range   Order Confirmation      ORDER PROCESSED BY BLOOD BANK Performed at [facility], [place] [address], [place], [place] [address]    No results found for this or any previous visit (from the past 240 hour(s)). Creatinine: Recent Labs    01/05/19 1340  CREATININE 6.98*    Xrays: See report/chart   Impression/Assessment:  Left 16 Fr foley in place since draining and I irrigated bladder with one liter of saline; mild clots; cool aid colored  Plan:  Leave foley in place  Get CT stone protocol to assess kidneys and hydro Send urine for c/s Irrigate catheter PRN with sterile water or saline Will follow Outpatient trial of voiding and cysto Send home with foley  Nery Kalisz A Ohana Birdwell 01/05/2019, 5:06 PM

## 2019-01-05 NOTE — H&P (Signed)
History and Physical    Richard Hall JME:[id number] DOB: Oct 01, 1955 DOA: 01/05/2019  PCP: [name], MD Consultants:  Posey Pronto - endocrinology; Clydene Laming - GI; nephrology Patient coming from:  Home - lives alone; [place]: Son, [phone]  Chief Complaint:  Dizziness  HPI: Richard Hall is a 63 y.o. male with medical history significant of constipation; HLD; depression; DM;afib (patient denies, not on [facility]); hypothyroidism; OSA on CPAP; HTN; and ESRD on TTS HD since January presenting with dizziness.  He went to [facility] yesterday for urinary retention.  They put a foley in and his urine has been increasingly bloody.  He became dizzy today with standing and walking.  He got so dizzy while walking that he collapsed to the floor.  He was too weak to walk but did not lose consciousness.  No SOB.  No fever.  He has not noticed any blood in his stools.  The urine is "all blood", clotting the bag.  He pees daily and was peeing less than usual yesterday.  He was seen yesterday in the [facility] ER.  He c/o difficulty urinating since 7/29.  A prior bladder scan on 7/20 showed 4129 cc of urine in his bladder.  Renal US from 7/30 showed moderate R and mild L hydronephrosis with evidence of chronic medical renal disease.  He "recently" had normal EGD and colonoscopy and video capsule endoscopy showed 1 AVM.  Last visit was in 12/19.   ED Course:   Near syncope today at home with change of position.  Had HD yesterday.  Had urinary retention yesterday and so had foley placed at [facility] yesterday.  Hypotensive with EMS, given 500 cc IVF.  Mentating well despite low BP.  Hgb 6.8, looks pale; prior Hgb 10.  Stool dark black.  Could be GI bleed, ESRD.  Giving 1 unit emergency release blood.  Denies h/o GI bleed.  No NSAIDs.  Suggested to call GI, will give Protonix.  COVID test pending.  Review of Systems: As per HPI; otherwise review of systems reviewed and negative.   Ambulatory Status:  Ambulates with a cane  Past Medical History:   Diagnosis Date  . CVA (cerebral vascular accident) ([place]) 2010  . Diabetes mellitus with end-stage renal disease ([place])   . Dyslipidemia   . Essential hypertension   . Hypothyroidism (acquired)   . OSA on CPAP     Past Surgical History:  Procedure Laterality Date  . APPENDECTOMY      Social History   Socioeconomic History  . Marital status: Single    Spouse name: Not on file  . Number of children: Not on file  . Years of education: Not on file  . Highest education level: Not on file  Occupational History  . Occupation: retired  Scientific laboratory technician  . Financial resource strain: Not on file  . Food insecurity    Worry: Not on file    Inability: Not on file  . Transportation needs    Medical: Not on file    Non-medical: Not on file  Tobacco Use  . Smoking status: Former Smoker    Quit date: 1997    Years since quitting: 23.6  . Smokeless tobacco: Never Used  Substance and Sexual Activity  . Alcohol use: Never    Frequency: Never  . Drug use: Never  . Sexual activity: Not on file  Lifestyle  . Physical activity    Days per week: Not on file    Minutes per session: Not on file  .  Stress: Not on file  Relationships  . Social Herbalist on phone: Not on file    Gets together: Not on file    Attends religious service: Not on file    Active member of club or organization: Not on file    Attends meetings of clubs or organizations: Not on file    Relationship status: Not on file  . Intimate partner violence    Fear of current or ex partner: Not on file    Emotionally abused: Not on file    Physically abused: Not on file    Forced sexual activity: Not on file  Other Topics Concern  . Not on file  Social History Narrative  . Not on file    Not on File  History reviewed. No pertinent family history.  Prior to Admission medications   Not on File    Physical Exam: Vitals:   01/05/19 1345 01/05/19 1503 01/05/19 1518 01/05/19 1530  BP: (!) 91/51 (!) 87/50  (!) 109/54 106/67  Pulse: 73 78 78 77  Resp: 19 20 (!) 22 (!) 21  Temp:  97.7 F (36.5 C) 98.1 F (36.7 C)   TempSrc:  Oral Oral   SpO2: 99%   99%     . General:  Appears calm and comfortable and is NAD . Eyes:  PERRL, EOMI, normal lids, iris . ENT:  grossly normal hearing, lips & tongue, mmm . Neck:  no LAD, masses or thyromegaly . Cardiovascular:  RRR, no m/r/g. No LE edema.  Marland Kitchen Respiratory:   CTA bilaterally with no wheezes/rales/rhonchi.  Normal respiratory effort. . Abdomen:  soft, NT, ND, NABS . Skin:  no rash or induration seen on limited exam . Musculoskeletal:  grossly normal tone BUE/BLE, good ROM, no bony abnormality . Psychiatric:  grossly normal mood and affect, speech fluent and appropriate, AOx3 . Neurologic:  CN 2-12 grossly intact, moves all extremities in coordinated fashion, sensation intact    Radiological Exams on Admission: Dg Chest 2 View  Result Date: 01/05/2019 CLINICAL DATA:  Weakness, pain, fall EXAM: CHEST - 2 VIEW COMPARISON:  Chest radiograph 08/30/2018, chest CT 09/23/2017 FINDINGS: Accounting for body habitus, the lungs are clear. No consolidation, features of edema, pneumothorax, or effusion. Pulmonary vascularity is normally distributed. The cardiomediastinal contours are stable from prior studies. No acute osseous or soft tissue abnormality. Degenerative changes are present in the and imaged spine and shoulders. IMPRESSION: No active cardiopulmonary disease or acute traumatic abnormality. Electronically Signed   By: Lovena Le M.D.   On: 01/05/2019 14:33   Dg Elbow Complete Left  Result Date: 01/05/2019 CLINICAL DATA:  Fall, weakness, left elbow abrasion EXAM: LEFT ELBOW - COMPLETE 3+ VIEW COMPARISON:  None. FINDINGS: Small nondisplaced fracture of the coronoid process. Small left elbow joint effusion is present. There is circumferential soft tissue swelling of the elbow. No additional fracture or traumatic malalignment. Vascular calcium is seen in  the soft tissues. IMPRESSION: Small nondisplaced fracture of the coronoid process. Electronically Signed   By: Lovena Le M.D.   On: 01/05/2019 14:37   Dg Knee Complete 4 Views Left  Result Date: 01/05/2019 CLINICAL DATA:  Weakness, pain, fall. Left knee pain. EXAM: LEFT KNEE - COMPLETE 4+ VIEW COMPARISON:  None. FINDINGS: Severe tricompartmental osteoarthrosis most pronounced in the medial femorotibial compartment where there is bone-on-bone contact though evaluation of the alignment is limited on nonweightbearing views. No acute fracture or traumatic malalignment. The lateral radiograph is suboptimally projected limiting  evaluation for small effusion. No large effusion is seen. Minimal soft tissue thickening anterior to the knee. Vascular calcium seen posteriorly. IMPRESSION: Severe tricompartmental osteoarthrosis. No definite acute osseous abnormality. Electronically Signed   By: Lovena Le M.D.   On: 01/05/2019 14:35    EKG: Independently reviewed.  NSR with rate 75; no evidence of acute ischemia   Labs on Admission: I have personally reviewed the available labs and imaging studies at the time of the admission.  Pertinent labs:   Na++ 133 CO2 18 Glucose 255 BUN 43/Creatinine 6.98/GFR 9 Anion gap 17 Albumin 2.7 HS troponin 22 WBC 16.6 Hgb 6.8; baseline 10-11, last 10.5 on 10/18/18 Heme positive  Assessment/Plan Principal Problem:   Symptomatic anemia Active Problems:   OSA on CPAP   Hypothyroidism (acquired)   Essential hypertension   Dyslipidemia   Diabetes mellitus with end-stage renal disease (HCC)   Gross hematuria   Gastrointestinal hemorrhage with melena   Symptomatic anemia -Patient with frank hematuria and also melanotic stools presenting today with dizziness -Baseline Hgb appears to be 10-11, currently 6.8; it was 11.0 yesterday at [facility] -Dizziness is likely associated with symptomatic anemia -Heme testing was positive in the ER, see below. -Patient also with  gross hematuria and reports clots (see below) -Will observe on telemetry -Transfuse 1 unit PRBC to start and recheck Hgb afterwards.  -Near syncope was likely associated with this issue.    Gross hematuria -Dr. Matilde Sprang was consulted and has seen the patient  -Foley was irrigated and is to remain in place -CT stone protocol ordered; contrast can be given since he is an HD patient  -Urine culture is pending -Will need outpatient f/u with urology -Hgb decrease of 4 grams would indicate a massive amount of urologic blood loss and is likely not exclusively related to this issue  GI bleeding -Prior c-scope, EGD, and capsule endo in 2019 generally unremarkable with 1 AVM appreciated on capsule -Heme positive stools in ER -Protonix added -GI consult for possible further evaluation -While GI blood loss is less obvious, urologic blood loss generally leads to less -He had a 1.8 cm echogenic liver mass on 7/30 renal US and so will ask CT to evaluate this issue, as well  ESRD on HD -Patient on chronic TTS HD, last HD yesterday -Nephrology prn order set utilized -He does not appear to be volume overloaded or otherwise in need of acute HD -I sent a text message to Dr. Johnney Ou that patient will need HD tomorrow  DM -Will check A1c -Hold Glucotrol -Continue Lantus -Cover with sensitive-scale SSI  HTN -Continue hydralazine, Imdur  Hypothyroidism -Continue Synthroid  OSA on CPAP -Continue CPAP  HLD -Continue Lipitor -Hold Lopid while inpatient   Note: This patient has been tested and is pending for the novel coronavirus COVID-19.  DVT prophylaxis:  SCDs Code Status:   Full - confirmed with patient Family Communication: None present; patient declined having me call his son Disposition Plan:  Home once clinically improved Consults called: Urology; GI Admission status: It is my clinical opinion that referral for OBSERVATION is reasonable and necessary in this patient based on the  above information provided. The aforementioned taken together are felt to place the patient at high risk for further clinical deterioration. However it is anticipated that the patient may be medically stable for discharge from the hospital within 24 to 48 hours.    Karmen Bongo MD [facility]   How to contact the [facility] Attending or Consulting provider 7A -  7P or covering provider during after hours [place], for this patient?  1. Check the care team in [facility] and look for a) attending/consulting TRH provider listed and b) the [facility] team listed 2. Log into [email or web address] and use [facility]'s universal password to access. If you do not have the password, please contact the hospital operator. 3. Locate the [facility] provider you are looking for under [facility] and page to a number that you can be directly reached. 4. If you still have difficulty reaching the provider, please page the [facility] (Director on Call) for the Hospitalists listed on amion for assistance.   01/05/2019, 5:30 PM

## 2019-01-05 NOTE — Progress Notes (Signed)
Asked patient about using CPAP while here at the hospital.  He stated that as long as the bed is able to elevate his head, he does not need it.  He stated that he has purchased a sleep number bed at home that is able to elevate his head, so he has not been using at home either.  Patient stated that he cannot sleep with one of those machines.  No distress noted, will continue to monitor.

## 2019-01-05 NOTE — ED Provider Notes (Addendum)
[facility] EMERGENCY DEPARTMENT Provider Note   CSN: [id number] Arrival date & time: 01/05/19  1330    History   Chief Complaint Chief Complaint  Patient presents with   Dizziness    HPI Richard Hall is a 63 y.o. male.     Patient with history of end-stage renal disease on hemodialysis who presents the ED after near syncopal event.  Patient was changing positions in his house and got lightheaded and fell to the floor.  Hit his left elbow and left knee.  Did not lose consciousness.  Patient had dialysis yesterday.  He suffered with some urinary retention and had a Foley placed in the emergency department and ran off yesterday.  He has had some blood-tinged urine since then.  He is not on a blood thinner.  He thinks that Foley may be clotted off.  Patient denies any abdominal pain, chest pain, shortness of breath.  Patient with low blood pressure with EMS and was given 500 cc of fluid.  The history is provided by the patient.  Near Syncope This is a new problem. The current episode started 1 to 2 hours ago. The problem has been resolved. Pertinent negatives include no chest pain, no abdominal pain, no headaches and no shortness of breath. The symptoms are aggravated by standing. The symptoms are relieved by rest. Treatments tried: fluids. The treatment provided mild relief.    No past medical history on file.  There are no active problems to display for this patient.   End-stage renal disease on hemodialysis  High cholesterol  Diabetes, heart failure, hypertension    Home Medications    Prior to Admission medications   Not on File    Family History No family history on file.  Social History Social History   Tobacco Use   Smoking status: Not on file  Substance Use Topics   Alcohol use: Not on file   Drug use: Not on file     Allergies   Patient has no allergy information on record.   Review of Systems Review of Systems   Constitutional: Negative for chills and fever.  HENT: Negative for ear pain and sore throat.   Eyes: Negative for pain and visual disturbance.  Respiratory: Negative for cough and shortness of breath.   Cardiovascular: Positive for near-syncope. Negative for chest pain and palpitations.  Gastrointestinal: Negative for abdominal pain and vomiting.  Genitourinary: Positive for hematuria. Negative for dysuria.  Musculoskeletal: Negative for arthralgias and back pain.  Skin: Negative for color change and rash.  Neurological: Positive for dizziness and light-headedness. Negative for seizures, syncope and headaches.  All other systems reviewed and are negative.    Physical Exam Updated Vital Signs  ED Triage Vitals  Enc Vitals Group     BP 01/05/19 1343 (!) 94/51     Pulse Rate 01/05/19 1343 73     Resp 01/05/19 1343 18     Temp 01/05/19 1343 97.7 F (36.5 C)     Temp Source 01/05/19 1343 Oral     SpO2 01/05/19 1343 99 %     Weight --      Height --      Head Circumference --      Peak Flow --      Pain Score 01/05/19 1335 0     Pain Loc --      Pain Edu? --      Excl. in [place]? --     Physical Exam Vitals signs  and nursing note reviewed.  Constitutional:      General: He is not in acute distress.    Appearance: He is well-developed. He is not ill-appearing.  HENT:     Head: Normocephalic and atraumatic.     Nose: Nose normal.     Mouth/Throat:     Mouth: Mucous membranes are moist.  Eyes:     Extraocular Movements: Extraocular movements intact.     Conjunctiva/sclera: Conjunctivae normal.     Pupils: Pupils are equal, round, and reactive to light.  Neck:     Musculoskeletal: Normal range of motion and neck supple.  Cardiovascular:     Rate and Rhythm: Normal rate and regular rhythm.     Pulses: Normal pulses.     Heart sounds: Normal heart sounds. No murmur.  Pulmonary:     Effort: Pulmonary effort is normal. No respiratory distress.     Breath sounds: Normal  breath sounds.  Abdominal:     General: There is no distension.     Palpations: Abdomen is soft.     Tenderness: There is no abdominal tenderness.  Genitourinary:    Comments: Foley has blood-tinged urine in it Musculoskeletal: Normal range of motion.        General: No tenderness.  Skin:    General: Skin is warm and dry.     Capillary Refill: Capillary refill takes less than 2 seconds.     Comments: Abrasion to left knee and left elbow  Neurological:     General: No focal deficit present.     Mental Status: He is alert and oriented to person, place, and time.     Cranial Nerves: No cranial nerve deficit.     Sensory: No sensory deficit.     Motor: No weakness.     Coordination: Coordination normal.     Comments: 5+ out of 5 strength throughout, normal sensation, no drift, normal finger-to-nose finger      ED Treatments / Results  Labs (all labs ordered are listed, but only abnormal results are displayed) Labs Reviewed  BASIC METABOLIC PANEL - Abnormal; Notable for the following components:      Result Value   Sodium 133 (*)    CO2 18 (*)    Glucose, Bld 255 (*)    BUN 43 (*)    Creatinine, Ser 6.98 (*)    Calcium 8.0 (*)    GFR calc non Af Amer 8 (*)    GFR calc Af Amer 9 (*)    Anion gap 17 (*)    All other components within normal limits  CBC WITH DIFFERENTIAL/PLATELET - Abnormal; Notable for the following components:   WBC 16.6 (*)    RBC 2.19 (*)    Hemoglobin 6.8 (*)    HCT 22.3 (*)    MCV 101.8 (*)    Neutro Abs 14.4 (*)    Abs Immature Granulocytes 0.24 (*)    All other components within normal limits  HEPATIC FUNCTION PANEL - Abnormal; Notable for the following components:   Total Protein 5.9 (*)    Albumin 2.7 (*)    Bilirubin, Direct 0.4 (*)    All other components within normal limits  POC OCCULT BLOOD, ED - Abnormal; Notable for the following components:   Fecal Occult Bld POSITIVE (*)    All other components within normal limits  TROPONIN I  (HIGH SENSITIVITY) - Abnormal; Notable for the following components:   Troponin I (High Sensitivity) 22 (*)    All  other components within normal limits  URINE CULTURE  SARS CORONAVIRUS 2 (HOSPITAL ORDER, [facility])  URINALYSIS, ROUTINE W REFLEX MICROSCOPIC  CBG MONITORING, ED  TYPE AND SCREEN  ABO/RH  PREPARE RBC (CROSSMATCH)  PREPARE RBC (CROSSMATCH)    EKG EKG Interpretation  Date/Time:  Wednesday January 05 2019 14:52:57 EDT Ventricular Rate:  75 PR Interval:    QRS Duration: 92 QT Interval:  409 QTC Calculation: 457 R Axis:   -176 Text Interpretation:  Sinus rhythm Prolonged PR interval Right axis deviation Confirmed by Lennice Sites [phone]) on 01/05/2019 3:12:12 PM   Radiology Dg Chest 2 View  Result Date: 01/05/2019 CLINICAL DATA:  Weakness, pain, fall EXAM: CHEST - 2 VIEW COMPARISON:  Chest radiograph 08/30/2018, chest CT 09/23/2017 FINDINGS: Accounting for body habitus, the lungs are clear. No consolidation, features of edema, pneumothorax, or effusion. Pulmonary vascularity is normally distributed. The cardiomediastinal contours are stable from prior studies. No acute osseous or soft tissue abnormality. Degenerative changes are present in the and imaged spine and shoulders. IMPRESSION: No active cardiopulmonary disease or acute traumatic abnormality. Electronically Signed   By: Lovena Le M.D.   On: 01/05/2019 14:33   Dg Elbow Complete Left  Result Date: 01/05/2019 CLINICAL DATA:  Fall, weakness, left elbow abrasion EXAM: LEFT ELBOW - COMPLETE 3+ VIEW COMPARISON:  None. FINDINGS: Small nondisplaced fracture of the coronoid process. Small left elbow joint effusion is present. There is circumferential soft tissue swelling of the elbow. No additional fracture or traumatic malalignment. Vascular calcium is seen in the soft tissues. IMPRESSION: Small nondisplaced fracture of the coronoid process. Electronically Signed   By: Lovena Le M.D.   On:  01/05/2019 14:37   Dg Knee Complete 4 Views Left  Result Date: 01/05/2019 CLINICAL DATA:  Weakness, pain, fall. Left knee pain. EXAM: LEFT KNEE - COMPLETE 4+ VIEW COMPARISON:  None. FINDINGS: Severe tricompartmental osteoarthrosis most pronounced in the medial femorotibial compartment where there is bone-on-bone contact though evaluation of the alignment is limited on nonweightbearing views. No acute fracture or traumatic malalignment. The lateral radiograph is suboptimally projected limiting evaluation for small effusion. No large effusion is seen. Minimal soft tissue thickening anterior to the knee. Vascular calcium seen posteriorly. IMPRESSION: Severe tricompartmental osteoarthrosis. No definite acute osseous abnormality. Electronically Signed   By: Lovena Le M.D.   On: 01/05/2019 14:35    Procedures .Critical Care Performed by: Lennice Sites, DO Authorized by: Lennice Sites, DO   Critical care provider statement:    Critical care time (minutes):  45   Critical care was necessary to treat or prevent imminent or life-threatening deterioration of the following conditions:  Circulatory failure   Critical care was time spent personally by me on the following activities:  Blood draw for specimens, development of treatment plan with patient or surrogate, discussions with consultants, discussions with primary provider, evaluation of patient's response to treatment, examination of patient, obtaining history from patient or surrogate, ordering and performing treatments and interventions, ordering and review of laboratory studies, ordering and review of radiographic studies, re-evaluation of patient's condition, pulse oximetry and review of old charts   I assumed direction of critical care for this patient from another provider in my specialty: no     (including critical care time)  Medications Ordered in ED Medications  0.9 %  sodium chloride infusion (Manually program via Guardrails IV Fluids)  (has no administration in time range)  0.9 %  sodium chloride infusion (has no administration in  time range)  pantoprazole (PROTONIX) injection 40 mg (has no administration in time range)     Initial Impression / Assessment and Plan / ED Course  I have reviewed the triage vital signs and the nursing notes.  Pertinent labs & imaging results that were available during my care of the patient were reviewed by me and considered in my medical decision making (see chart for details).     Sly Parlee is a 63 year old male with history of end-stage renal disease on hemodialysis, diabetes, high cholesterol, heart failure, hypertension who presents to the ED after near syncopal episode.  Patient with normal vitals upon arrival.  No fever.  Got dizzy today while trying to stand up and change positions.  Had a controlled fall to the floor where he did not lose consciousness but has a skin tear to his left elbow and left knee.  Patient states that he was at [facility] yesterday for urinary retention and had a Foley placed.  He has had blood-tinged urine ever since.  Patient is not on a blood thinner.  Had full dialysis yesterday.  Overall is neurologically intact.  Denies any abdominal discomfort.  Continues to have urine come from his Foley bag.  Denies any chest pain, shortness of breath.  No headache.  Will evaluate with lab work, urinalysis, head CT, x-rays of his left elbow and left knee.  Patient already given 500 cc of fluid by EMS.  Had low blood pressure with EMS initially but has now improved.   Hemoglobin is 6.8.  Blood pressure dropped down to 87/50.  Patient given 1 unit of emergency release blood.  Will type and screen for an additional unit of blood.  Patient states he chronically has dark black stools due to being on iron infusions at dialysis.  Last hemoglobin that I can see is in March and was around 10.  Occult test is positive.  Overall this could be from a GI bleed and will give IV  Protonix.  Will let GI know about patient's admission to medicine service.  He has had some hematuria since Foley placement yesterday and this is less likely as source.  This could be acute on chronic anemia as well.  Troponin is elevated likely in the setting of severe anemia.  Does not have any chest pain.  EKG shows sinus rhythm.  No ischemic changes.  Patient hemodynamically improved following fluids with EMS and blood products.  Admitted to medicine.  Will test for coronavirus in case he needs procedure.  Also patient had nondisplaced fracture of the coronoid of the left elbow.  Hilbert Odor with orthopedics was consulted and he recommends sling for comfort.  We will leave further recommendations as needed.  This chart was dictated using voice recognition software.  Despite best efforts to proofread,  errors can occur which can change the documentation meaning.   Final Clinical Impressions(s) / ED Diagnoses   Final diagnoses:  Symptomatic anemia  Dizziness  Acute blood loss anemia  Closed fracture of left elbow, initial encounter    ED Discharge Orders    None       Lennice Sites, DO 01/05/19 [place], [place], DO 01/05/19 [place], [place], DO 01/05/19 1544

## 2019-01-05 NOTE — ED Notes (Signed)
EDP notified of critical lab 

## 2019-01-05 NOTE — ED Notes (Signed)
283 mL on bladder scan

## 2019-01-05 NOTE — ED Triage Notes (Addendum)
Pt arrive4d via [facility] from home c/o dizziness. Pt states he became dizzy today and had a controlled fall to the floor. Small skin tear noted to left elbow and left knee. Pt was seen at [facility] ED yesterday for urinary retention. Foley placed at RED. Leg bag contents are dark red. Pt not c/o distress at this time. EMS reported systolic pressure to be <829/F. Reported Other to be VSS. 18g IV in right FA, NS infusion started PTA. Pt is a Dialysis pt.

## 2019-01-05 NOTE — ED Notes (Signed)
Dinner tray ordered.

## 2019-01-05 NOTE — Progress Notes (Signed)
Received report

## 2019-01-06 DIAGNOSIS — N3289 Other specified disorders of bladder: Secondary | ICD-10-CM | POA: Diagnosis present

## 2019-01-06 DIAGNOSIS — D649 Anemia, unspecified: Secondary | ICD-10-CM | POA: Diagnosis not present

## 2019-01-06 DIAGNOSIS — I959 Hypotension, unspecified: Secondary | ICD-10-CM | POA: Diagnosis present

## 2019-01-06 DIAGNOSIS — N029 Recurrent and persistent hematuria with unspecified morphologic changes: Secondary | ICD-10-CM | POA: Diagnosis present

## 2019-01-06 DIAGNOSIS — Z6836 Body mass index (BMI) 36.0-36.9, adult: Secondary | ICD-10-CM | POA: Diagnosis not present

## 2019-01-06 DIAGNOSIS — E1122 Type 2 diabetes mellitus with diabetic chronic kidney disease: Secondary | ICD-10-CM

## 2019-01-06 DIAGNOSIS — I132 Hypertensive heart and chronic kidney disease with heart failure and with stage 5 chronic kidney disease, or end stage renal disease: Secondary | ICD-10-CM | POA: Diagnosis present

## 2019-01-06 DIAGNOSIS — D62 Acute posthemorrhagic anemia: Secondary | ICD-10-CM | POA: Diagnosis present

## 2019-01-06 DIAGNOSIS — I10 Essential (primary) hypertension: Secondary | ICD-10-CM

## 2019-01-06 DIAGNOSIS — E8889 Other specified metabolic disorders: Secondary | ICD-10-CM | POA: Diagnosis present

## 2019-01-06 DIAGNOSIS — D638 Anemia in other chronic diseases classified elsewhere: Secondary | ICD-10-CM | POA: Diagnosis present

## 2019-01-06 DIAGNOSIS — I480 Paroxysmal atrial fibrillation: Secondary | ICD-10-CM | POA: Diagnosis present

## 2019-01-06 DIAGNOSIS — N1330 Unspecified hydronephrosis: Secondary | ICD-10-CM | POA: Diagnosis present

## 2019-01-06 DIAGNOSIS — N186 End stage renal disease: Secondary | ICD-10-CM | POA: Diagnosis present

## 2019-01-06 DIAGNOSIS — E785 Hyperlipidemia, unspecified: Secondary | ICD-10-CM

## 2019-01-06 DIAGNOSIS — I509 Heart failure, unspecified: Secondary | ICD-10-CM | POA: Diagnosis present

## 2019-01-06 DIAGNOSIS — S42402A Unspecified fracture of lower end of left humerus, initial encounter for closed fracture: Secondary | ICD-10-CM | POA: Diagnosis present

## 2019-01-06 DIAGNOSIS — G4733 Obstructive sleep apnea (adult) (pediatric): Secondary | ICD-10-CM | POA: Diagnosis present

## 2019-01-06 DIAGNOSIS — Z20828 Contact with and (suspected) exposure to other viral communicable diseases: Secondary | ICD-10-CM | POA: Diagnosis present

## 2019-01-06 DIAGNOSIS — N329 Bladder disorder, unspecified: Secondary | ICD-10-CM | POA: Diagnosis present

## 2019-01-06 DIAGNOSIS — E039 Hypothyroidism, unspecified: Secondary | ICD-10-CM | POA: Diagnosis present

## 2019-01-06 DIAGNOSIS — R42 Dizziness and giddiness: Secondary | ICD-10-CM | POA: Diagnosis present

## 2019-01-06 DIAGNOSIS — C7951 Secondary malignant neoplasm of bone: Secondary | ICD-10-CM | POA: Diagnosis present

## 2019-01-06 DIAGNOSIS — W06XXXA Fall from bed, initial encounter: Secondary | ICD-10-CM | POA: Diagnosis present

## 2019-01-06 DIAGNOSIS — R310 Gross hematuria: Secondary | ICD-10-CM | POA: Diagnosis not present

## 2019-01-06 DIAGNOSIS — K921 Melena: Secondary | ICD-10-CM | POA: Diagnosis present

## 2019-01-06 DIAGNOSIS — R160 Hepatomegaly, not elsewhere classified: Secondary | ICD-10-CM | POA: Diagnosis present

## 2019-01-06 DIAGNOSIS — N2581 Secondary hyperparathyroidism of renal origin: Secondary | ICD-10-CM | POA: Diagnosis present

## 2019-01-06 LAB — URINALYSIS, ROUTINE W REFLEX MICROSCOPIC
Bacteria, UA: NONE SEEN
RBC / HPF: >50 RBC/hpf — ABNORMAL HIGH (ref 0–5)

## 2019-01-06 LAB — CBC
HCT: 20.9 % — ABNORMAL LOW (ref 39.0–52.0)
Hemoglobin: 6.7 g/dL — CL (ref 13.0–17.0)
MCH: 31.3 pg (ref 26.0–34.0)
MCHC: 32.1 g/dL (ref 30.0–36.0)
MCV: 97.7 fL (ref 80.0–100.0)
Platelets: 202 10*3/uL (ref 150–400)
RBC: 2.14 MIL/uL — ABNORMAL LOW (ref 4.22–5.81)
RDW: 15.6 % — ABNORMAL HIGH (ref 11.5–15.5)
WBC: 13.7 10*3/uL — ABNORMAL HIGH (ref 4.0–10.5)
nRBC: 0.1 % (ref 0.0–0.2)

## 2019-01-06 LAB — BASIC METABOLIC PANEL
Anion gap: 15 (ref 5–15)
BUN: 53 mg/dL — ABNORMAL HIGH (ref 8–23)
CO2: 20 mmol/L — ABNORMAL LOW (ref 22–32)
Calcium: 7.2 mg/dL — ABNORMAL LOW (ref 8.9–10.3)
Chloride: 99 mmol/L (ref 98–111)
Creatinine, Ser: 8.8 mg/dL — ABNORMAL HIGH (ref 0.61–1.24)
GFR calc Af Amer: 7 mL/min — ABNORMAL LOW (ref >60)
GFR calc non Af Amer: 6 mL/min — ABNORMAL LOW (ref >60)
Glucose, Bld: 108 mg/dL — ABNORMAL HIGH (ref 70–99)
Potassium: 4.3 mmol/L (ref 3.5–5.1)
Sodium: 134 mmol/L — ABNORMAL LOW (ref 135–145)

## 2019-01-06 LAB — GLUCOSE, CAPILLARY
Glucose-Capillary: 159 mg/dL — ABNORMAL HIGH (ref 70–99)
Glucose-Capillary: 143 mg/dL — ABNORMAL HIGH (ref 70–99)
Glucose-Capillary: 192 mg/dL — ABNORMAL HIGH (ref 70–99)
Glucose-Capillary: 209 mg/dL — ABNORMAL HIGH (ref 70–99)

## 2019-01-06 LAB — PSA: Prostatic Specific Antigen: 79.65 ng/mL — ABNORMAL HIGH (ref 0.00–4.00)

## 2019-01-06 LAB — PREPARE RBC (CROSSMATCH)

## 2019-01-06 LAB — MRSA PCR SCREENING: MRSA by PCR: NEGATIVE

## 2019-01-06 LAB — HIV ANTIBODY (ROUTINE TESTING W REFLEX): HIV Screen 4th Generation wRfx: NONREACTIVE

## 2019-01-06 LAB — HEMOGLOBIN A1C
Hgb A1c MFr Bld: 5.9 % — ABNORMAL HIGH (ref 4.8–5.6)
Mean Plasma Glucose: 122.63 mg/dL

## 2019-01-06 MED ORDER — FERRIC CITRATE 1 GM 210 MG(FE) PO TABS
420.0000 mg | ORAL_TABLET | Freq: Three times a day (TID) | ORAL | Status: DC
  Administered 2019-01-06 – 2019-01-12 (×14): 420 mg via ORAL
  Filled 2019-01-06 (×14): qty 2

## 2019-01-06 MED ORDER — SODIUM CHLORIDE 0.9% IV SOLUTION
Freq: Once | INTRAVENOUS | Status: AC
  Administered 2019-01-06: 15:00:00 via INTRAVENOUS

## 2019-01-06 MED ORDER — CALCITRIOL 0.5 MCG PO CAPS
1.0000 ug | ORAL_CAPSULE | ORAL | Status: DC
  Administered 2019-01-08 – 2019-01-11 (×3): 1 ug via ORAL
  Filled 2019-01-06 (×3): qty 2

## 2019-01-06 MED ORDER — CALCIUM CARBONATE ANTACID 500 MG PO CHEW
800.0000 mg | CHEWABLE_TABLET | Freq: Every day | ORAL | Status: DC
  Administered 2019-01-06 – 2019-01-10 (×4): 800 mg via ORAL
  Filled 2019-01-06 (×5): qty 4

## 2019-01-06 MED ORDER — CHLORHEXIDINE GLUCONATE CLOTH 2 % EX PADS: 6.0000 | MEDICATED_PAD | Freq: Every day | CUTANEOUS | Status: DC

## 2019-01-06 MED ORDER — DARBEPOETIN ALFA 100 MCG/0.5ML IJ SOSY
100.0000 ug | PREFILLED_SYRINGE | INTRAMUSCULAR | Status: DC
  Filled 2019-01-06: qty 0.5

## 2019-01-06 NOTE — Progress Notes (Signed)
HD orders modified by Dr Jonnie Finner for 01/07/2019,  Satira Sark, RN at [phone]

## 2019-01-06 NOTE — H&P (View-Only) (Signed)
Catheter draining dark blood Transfused blood for anemia CT: consistent with huge clot in bladder and ? [place] to T10/11 possibly from [place] Documented 4 liter bladder prior DRE bit limited due to body habitus but firm/harder   Replaced foley with 22FR 3 way Irrigated with 3 liters of saline much clot Now clear CBI running  Irrigate cath prn\ Ordered PSA (could have false pos)  Will follow THE BLADDER COULD BE SOURCE OF ANEMIA DUE TO LARGE CLOT FOUND

## 2019-01-06 NOTE — Plan of Care (Signed)
  Problem: Education: Goal: Knowledge of General Education information will improve Description Including pain rating scale, medication(s)/side effects and non-pharmacologic comfort measures Outcome: Progressing   Problem: Health Behavior/Discharge Planning: Goal: Ability to manage health-related needs will improve Outcome: Progressing   

## 2019-01-06 NOTE — Progress Notes (Signed)
New Admission Note:  Arrival Method: Via stretcher from ED Mental Orientation: Alert & Oriented x4 Telemetry: CCMD verifed Assessment: Completed Skin: Refer to flowsheet IV: Right FA x2 Pain: 0/10 Tubes: Safety Measures: Safety Fall Prevention Plan discussed with patient. Admission: Completed [facility] [place] Orientation: Patient has been orientated to the room, unit and the staff.  Orders have been reviewed and are being implemented. Will continue to monitor the patient. Call light has been placed within reach and bed alarm has been activated.   Vassie Moselle, RN  Phone Number: [phone]

## 2019-01-06 NOTE — Progress Notes (Signed)
Catheter draining dark blood Transfused blood for anemia CT: consistent with huge clot in bladder and ? Mets to T10/11 possibly from [place] Documented 4 liter bladder prior DRE bit limited due to body habitus but firm/harder   Replaced foley with 22FR 3 way Irrigated with 3 liters of saline much clot Now clear CBI running  Irrigate cath prn\ Ordered PSA (could have false pos)  Will follow THE BLADDER COULD BE SOURCE OF ANEMIA DUE TO LARGE CLOT FOUND

## 2019-01-06 NOTE — Progress Notes (Signed)
Patient refused CPAP.

## 2019-01-06 NOTE — Progress Notes (Signed)
PROGRESS NOTE  Richard Hall NLZ:[id number] DOB: 1956/04/21 DOA: 01/05/2019 PCP: [name], MD  HPI/Recap of past 24 hours: HPI from Dr [name] is a 63 y.o. male with medical history significant of constipation; HLD; depression; DM;afib (patient denies, not on [facility]); hypothyroidism; OSA on CPAP; HTN; and ESRD on TTS HD since January presenting with dizziness.  He went to [facility] yesterday for urinary retention.  They put a foley in and his urine has been increasingly bloody.  He became dizzy with standing and walking.  He got so dizzy while walking that he collapsed to the floor.  He was too weak to walk but did not lose consciousness.  No SOB.  No fever.  He has not noticed any blood in his stools.  The urine is "all blood", clotting the bag.  He pees daily and was peeing less than usual yesterday. Pt was seen yesterday in the [facility] ER. A prior bladder scan on 7/20 showed 4129 cc of urine in his bladder.  Renal US from 7/30 showed moderate R and mild L hydronephrosis with evidence of chronic medical renal disease. Pt had normal EGD and colonoscopy and video capsule endoscopy showed 1 AVM.  Last visit was in 12/19. In the ED, pt hypotensive with EMS, received 500 cc IVF.  Mentating well despite low BP.  Hgb 6.8, looks pale; prior Hgb 10.  Stool dark black, received 1 unit emergency release blood.  Denies h/o GI bleed.  No NSAIDs.  Urology consulted.  Patient admitted for further management.    Today, patient denies any new complaints, except for chronic back pain, denies any pain, chest pain, abdominal pain, dysuria, fever/chills.  Foley bag with dark-colored urine.      Assessment/Plan: Principal Problem:   Symptomatic anemia Active Problems:   OSA on CPAP   Hypothyroidism (acquired)   Essential hypertension   Dyslipidemia   Diabetes mellitus with end-stage renal disease (HCC)   Gross hematuria   Gastrointestinal hemorrhage with melena   Acute on chronic symptomatic blood loss  anemia likely 2/2 gross hematuria Still ongoing, Foley bag with dark-colored urine Baseline hemoglobin around 10-11 Received 2 units of P RBC on 8/5, will receive another 2 units of PRBC as hemoglobin is still below 7 UA/UC pending CT stone showed massively distended bladder with a huge mass or clot within the bladder, slight prominence of the renal collecting systems bilaterally including the ureters due to compression of the ureters by the markedly distended urinary bladder.  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 cancer Urology on board, placed Foley, irrigated bladder.  Plan for outpatient trial of voiding and cystoscopy Monitor closely Daily CBC  Leukocytosis Afebrile Resolving, likely reactive Checks x-ray unremarkable UA/UC pending Daily CBC  Near syncope Likely secondary to acute on chronic blood loss as mentioned above CT head unremarkable Monitor closely  Unlikely GI bleeding Had extensive GI work-up in May and September 2019 FOBT repeated by GI negative Dark stools likely due to iron supplementation GI consulted, no further work-up from the standpoint  ESRD on HD T/TH/S Nephrology on board  Diabetes mellitus type 2 A1c 5.9 SSI, Lantus, hypoglycemic protocol, Accu-Cheks  Hypertension Continue hydralazine, imdur  Hypothyroidism Continue Synthroid  Obesity Lifestyle modification advised        Malnutrition Type:      Malnutrition Characteristics:      Nutrition Interventions:       Estimated body mass index  is 39.51 kg/m as calculated from the following:   Height as of this encounter: 6' (1.829 m).   Weight as of this encounter: 132.1 kg.     Code Status: Full  Family Communication: None at bedside  Disposition Plan: Home once significant improvement   Consultants:  Urology  Nephrology  GI  Procedures:  None  Antimicrobials:  None  DVT  prophylaxis: SCDs   Objective: Vitals:   01/05/19 2145 01/05/19 2250 01/06/19 0358 01/06/19 0933  BP:  (!) 150/82 (!) 142/71 121/75  Pulse:  96 93 95  Resp:  20 20 18   Temp:  99.2 F (37.3 C)  98 F (36.7 C)  TempSrc:  Oral  Oral  SpO2:  100% 95% 97%  Weight:      Height: 6' (1.829 m)       Intake/Output Summary (Last 24 hours) at 01/06/2019 1139 Last data filed at 01/06/2019 0906 Gross per 24 hour  Intake 1150 ml  Output 1750 ml  Net -600 ml   Filed Weights   01/05/19 2128  Weight: 132.1 kg    Exam:  General: NAD   Cardiovascular: S1, S2 present  Respiratory: CTAB  Abdomen: Soft, nontender, nondistended, bowel sounds present  GU: Foley draining dark/tea colored urine  Musculoskeletal: No bilateral pedal edema noted  Skin: Normal  Psychiatry: Normal mood    Data Reviewed: CBC: Recent Labs  Lab 01/05/19 1340 01/06/19 0639  WBC 16.6* 13.7*  NEUTROABS 14.4*  --   HGB 6.8* 6.7*  HCT 22.3* 20.9*  MCV 101.8* 97.7  PLT 223 254   Basic Metabolic Panel: Recent Labs  Lab 01/05/19 1340 01/06/19 0639  NA 133* 134*  K 4.9 4.3  CL 98 99  CO2 18* 20*  GLUCOSE 255* 108*  BUN 43* 53*  CREATININE 6.98* 8.80*  CALCIUM 8.0* 7.2*   GFR: Estimated Creatinine Clearance: 12.1 mL/min (A) (by C-G formula based on SCr of 8.8 mg/dL (H)). Liver Function Tests: Recent Labs  Lab 01/05/19 1340  AST 25  ALT <5  ALKPHOS 114  BILITOT 1.1  PROT 5.9*  ALBUMIN 2.7*   No results for input(s): LIPASE, AMYLASE in the last 168 hours. No results for input(s): AMMONIA in the last 168 hours. Coagulation Profile: No results for input(s): INR, PROTIME in the last 168 hours. Cardiac Enzymes: No results for input(s): CKTOTAL, CKMB, CKMBINDEX, TROPONINI in the last 168 hours. BNP (last 3 results) No results for input(s): PROBNP in the last 8760 hours. HbA1C: Recent Labs    01/06/19 0639  HGBA1C 5.9*   CBG: Recent Labs  Lab 01/05/19 1919 01/05/19 2129 01/06/19  0744 01/06/19 1126  GLUCAP 143* 187* 159* 143*   Lipid Profile: No results for input(s): CHOL, HDL, LDLCALC, TRIG, CHOLHDL, LDLDIRECT in the last 72 hours. Thyroid Function Tests: No results for input(s): TSH, T4TOTAL, FREET4, T3FREE, THYROIDAB in the last 72 hours. Anemia Panel: No results for input(s): VITAMINB12, FOLATE, FERRITIN, TIBC, IRON, RETICCTPCT in the last 72 hours. Urine analysis: No results found for: COLORURINE, APPEARANCEUR, LABSPEC, PHURINE, GLUCOSEU, HGBUR, BILIRUBINUR, KETONESUR, PROTEINUR, UROBILINOGEN, NITRITE, LEUKOCYTESUR Sepsis Labs: @LABRCNTIP (procalcitonin:4,lacticidven:4)  ) Recent Results (from the past 240 hour(s))  SARS Coronavirus 2 [facility] order, Performed in [facility] hospital lab) Nasopharyngeal Nasopharyngeal Swab     Status: None   Collection Time: 01/05/19  5:50 PM   Specimen: Nasopharyngeal Swab  Result Value Ref Range Status   SARS Coronavirus 2 NEGATIVE NEGATIVE Final    Comment: (NOTE) If result is NEGATIVE  SARS-CoV-2 target nucleic acids are NOT DETECTED. The SARS-CoV-2 RNA is generally detectable in upper and lower  respiratory specimens during the acute phase of infection. The lowest  concentration of SARS-CoV-2 viral copies this assay can detect is 250  copies / mL. A negative result does not preclude SARS-CoV-2 infection  and should not be used as the sole basis for treatment or other  patient management decisions.  A negative result may occur with  improper specimen collection / handling, submission of specimen other  than nasopharyngeal swab, presence of viral mutation(s) within the  areas targeted by this assay, and inadequate number of viral copies  (<250 copies / mL). A negative result must be combined with clinical  observations, patient history, and epidemiological information. If result is POSITIVE SARS-CoV-2 target nucleic acids are DETECTED. The SARS-CoV-2 RNA is generally detectable in upper and lower  respiratory  specimens dur ing the acute phase of infection.  Positive  results are indicative of active infection with SARS-CoV-2.  Clinical  correlation with patient history and other diagnostic information is  necessary to determine patient infection status.  Positive results do  not rule out bacterial infection or co-infection with other viruses. If result is PRESUMPTIVE POSTIVE SARS-CoV-2 nucleic acids MAY BE PRESENT.   A presumptive positive result was obtained on the submitted specimen  and confirmed on repeat testing.  While 2019 novel coronavirus  (SARS-CoV-2) nucleic acids may be present in the submitted sample  additional confirmatory testing may be necessary for epidemiological  and / or clinical management purposes  to differentiate between  SARS-CoV-2 and other Sarbecovirus currently known to infect humans.  If clinically indicated additional testing with an alternate test  methodology [phone]) is advised. The SARS-CoV-2 RNA is generally  detectable in upper and lower respiratory sp ecimens during the acute  phase of infection. The expected result is Negative. Fact Sheet for Patients:  StrictlyIdeas.no Fact Sheet for Healthcare Providers: BankingDealers.co.za This test is not yet approved or cleared by the [place] FDA and has been authorized for detection and/or diagnosis of SARS-CoV-2 by FDA under an Emergency Use Authorization (EUA).  This EUA will remain in effect (meaning this test can be used) for the duration of the COVID-19 declaration under Section 564(b)(1) of the Act, 21 U.S.C. section 360bbb-3(b)(1), unless the authorization is terminated or revoked sooner. Performed at [facility], [place] [address], [place], [place] [address]   MRSA PCR Screening     Status: None   Collection Time: 01/05/19 10:48 PM   Specimen: Nasopharyngeal  Result Value Ref Range Status   MRSA by PCR NEGATIVE NEGATIVE Final    Comment:         The GeneXpert MRSA Assay (FDA approved for NASAL specimens only), is one component of a comprehensive MRSA colonization surveillance program. It is not intended to diagnose MRSA infection nor to guide or monitor treatment for MRSA infections. Performed at [facility], [place] [address] [place], [place] [address]       Studies: Dg Chest 2 View  Result Date: 01/05/2019 CLINICAL DATA:  Weakness, pain, fall EXAM: CHEST - 2 VIEW COMPARISON:  Chest radiograph 08/30/2018, chest CT 09/23/2017 FINDINGS: Accounting for body habitus, the lungs are clear. No consolidation, features of edema, pneumothorax, or effusion. Pulmonary vascularity is normally distributed. The cardiomediastinal contours are stable from prior studies. No acute osseous or soft tissue abnormality. Degenerative changes are present in the and imaged spine and shoulders. IMPRESSION: No active cardiopulmonary disease  or acute traumatic abnormality. Electronically Signed   By: Lovena Le M.D.   On: 01/05/2019 14:33   Dg Elbow Complete Left  Result Date: 01/05/2019 CLINICAL DATA:  Fall, weakness, left elbow abrasion EXAM: LEFT ELBOW - COMPLETE 3+ VIEW COMPARISON:  None. FINDINGS: Small nondisplaced fracture of the coronoid process. Small left elbow joint effusion is present. There is circumferential soft tissue swelling of the elbow. No additional fracture or traumatic malalignment. Vascular calcium is seen in the soft tissues. IMPRESSION: Small nondisplaced fracture of the coronoid process. Electronically Signed   By: Lovena Le M.D.   On: 01/05/2019 14:37   Ct Head Wo Contrast  Result Date: 01/05/2019 CLINICAL DATA:  Syncope EXAM: CT HEAD WITHOUT CONTRAST TECHNIQUE: Contiguous axial images were obtained from the base of the skull through the vertex without intravenous contrast. COMPARISON:  CT head 08/20/2018 FINDINGS: Brain: Mild atrophy. Negative for acute infarct, hemorrhage, mass. No midline shift. Mild hypodensity in the  periventricular white matter left frontal lobe. 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 Mild mucosal edema paranasal sinuses. Other: None IMPRESSION: No acute intracranial abnormality and no change from the prior study. Electronically Signed   By: Franchot Gallo M.D.   On: 01/05/2019 19:13   Dg Knee Complete 4 Views Left  Result Date: 01/05/2019 CLINICAL DATA:  Weakness, pain, fall. Left knee pain. EXAM: LEFT KNEE - COMPLETE 4+ VIEW COMPARISON:  None. FINDINGS: Severe tricompartmental osteoarthrosis most pronounced in the medial femorotibial compartment where there is bone-on-bone contact though evaluation of the alignment is limited on nonweightbearing views. No acute fracture or traumatic malalignment. The lateral radiograph is suboptimally projected limiting evaluation for small effusion. No large effusion is seen. Minimal soft tissue thickening anterior to the knee. Vascular calcium seen posteriorly. IMPRESSION: Severe tricompartmental osteoarthrosis. No definite acute osseous abnormality. Electronically Signed   By: Lovena Le M.D.   On: 01/05/2019 14:35   Ct Renal Stone Study  Result Date: 01/05/2019 CLINICAL DATA:  Hematuria. EXAM: CT ABDOMEN AND PELVIS WITHOUT CONTRAST TECHNIQUE: Multidetector CT imaging of the abdomen and pelvis was performed following the standard protocol without IV contrast. COMPARISON:  None. FINDINGS: Lower chest: No acute abnormality. Coronary artery calcification. Heart size is normal. No pericardial effusion. Lung bases are clear. Hepatobiliary: There is a vague low-density lesion in the inferior aspect of the right lobe of the liver measuring approximately 2 cm in diameter. This correlates with the hyperechoic lesion on ultrasound and probably represents a benign hemangioma. Liver parenchyma is otherwise normal. Biliary tree is normal. Pancreas: Normal. Spleen: Normal. Adrenals/Urinary  Tract: Adrenal glands are normal. 2.6 cm exophytic cyst on the lateral aspect of the mid left kidney. 3.2 cm exophytic cyst on the lower pole the right kidney. Slight prominence of the renal collecting systems bilaterally including dilatation of the ureters in the pelvis. The bladder is markedly distended with a huge mass or clot within the bladder. The mass measures 16 cm in diameter. Foley catheter is in place at the bladder base. Stomach/Bowel: The bowel is normal. Appendix has been removed. Vascular/Lymphatic: Aortic atherosclerosis. No enlarged abdominal or pelvic lymph nodes. Reproductive: Prostate is unremarkable. Other: No abdominal wall hernia or abnormality. No abdominopelvic ascites. Musculoskeletal: The entire body of the T11 vertebra is sclerotic. There is also sclerosis of the anterior aspect of T10. There are several tiny sclerotic lesions in the pelvic bones. IMPRESSION: 1. Massively distended bladder with a huge mass or clot within the  bladder. 2. Sclerotic lesions in the T11 and T10 vertebral bodies consistent with metastatic disease. Tiny sclerotic lesions in the pelvic bones may also represent metastatic disease. This is most commonly associated with prostate cancer. 3. Slight prominence of the renal collecting systems bilaterally including the ureters in the pelvis this is felt to be due to compression of the ureters by the markedly distended urinary bladder. Aortic Atherosclerosis (ICD10-I70.0). Electronically Signed   By: Lorriane Shire M.D.   On: 01/05/2019 19:22    Scheduled Meds: . sodium chloride   Intravenous Once  . sodium chloride   Intravenous Once  . amitriptyline  100 mg Oral QHS  . atorvastatin  40 mg Oral Daily  . DULoxetine  30 mg Oral Daily  . hydrALAZINE  50 mg Oral TID  . insulin aspart  0-5 Units Subcutaneous QHS  . insulin aspart  0-9 Units Subcutaneous TID WC  . insulin glargine  20 Units Subcutaneous Q2200  . isosorbide mononitrate  60 mg Oral Daily  .  levothyroxine  50 mcg Oral Q0600  . sodium chloride flush  3 mL Intravenous Q12H    Continuous Infusions:   LOS: 0 days     Alma Friendly, MD [facility]  If 7PM-7AM, please contact night-coverage [email or web address] 01/06/2019, 11:39 AM

## 2019-01-06 NOTE — Consult Note (Addendum)
[facility] Renal Consultation Note    Indication for Consultation:  Management of ESRD/hemodialysis, anemia, hypertension/volume, and secondary hyperparathyroidism. PCP:  HPI: Richard Hall is a 63 y.o. male with a history of ESRD on dialysis, DM, HTN, and CVA who presented to the ED yesterday with dizziness. Patient had a recent diagnosis of urinary retention and bladder scan showed 4129 cc of urine on 7/20. A renal ultrasound on 7/30 showed moderate R and mild L hydronephrosis. A foley catheter was placed yesterday and he noticed his urine became increasingly bloody throughout the day. He reported he became very dizzy and weak, and eventually collapsed prompting ED visit. He also reports dark stool, but reports it is always dark since he started dialysis. He does take [place] which contains iron, and had a recent course of IV iron, which he believes is the cause of the dark stool. He does have a history of AVM and was seen by GI, who reports FOBT was not positive. No current plan for endoscopic or colonoscopic work up but did add protonix. Also seen by urology who ordered CT to assess kidneys, urine culture, and catheter irrigation. CT showed massively dilated bladder with huge mass or clot within the bladder, sclerotic lesions in the vertebral bodies consistent with metastatic disease, and slight prominence of renal collecting systems felt to be due to compression of the ureters by bladder distention.  He was hypotensive on admission and given 500 cc IVF by EMS and given 1 unit emergency release blood. Hemglobin is still low today at 6.7 and 2 units of blood have been ordered. BP is stable/moderately elevated today. K+ 4.3, BUN 53, Calcium 7.2, WBC 13.7. Patient did have recent hypocalcemia at dialysis and was taking 4 tums QHS.   Today, patient reports he is feeling weak. He denies SOB, dyspnea, cough, CP, palpitations, abdominal pain, N/V/D. Ongoing blood in urine. He does report that his  back hurts from laying in bed. Denies any recent issues with dialysis.   Past Medical History:  Diagnosis Date  . CVA (cerebral vascular accident) ([place]) 2010  . Diabetes mellitus with end-stage renal disease ([place])   . Dyslipidemia   . Essential hypertension   . Hypothyroidism (acquired)   . OSA on CPAP    Past Surgical History:  Procedure Laterality Date  . APPENDECTOMY     History reviewed. No pertinent family history. Social History:  reports that he quit smoking about 23 years ago. He has never used smokeless tobacco. He reports that he does not drink alcohol or use drugs.  ROS: As per HPI otherwise negative.   Physical Exam: Vitals:   01/05/19 2145 01/05/19 2250 01/06/19 0358 01/06/19 0933  BP:  (!) 150/82 (!) 142/71 121/75  Pulse:  96 93 95  Resp:  20 20 18   Temp:  99.2 F (37.3 C)  98 F (36.7 C)  TempSrc:  Oral  Oral  SpO2:  100% 95% 97%  Weight:      Height: 6' (1.829 m)        General: Well developed, well nourished, in no acute distress.  Head: Normocephalic, atraumatic, sclera non-icteric, mucus membranes are moist. Neck: JVD not elevated. Lungs: Clear bilaterally to auscultation without wheezes, rales, or rhonchi. Breathing is unlabored. Heart: RRR with normal S1, S2. No murmurs, rubs, or gallops appreciated. Abdomen: Soft, non-tender, non-distended with normoactive bowel sounds. No rebound/guarding. No obvious abdominal masses.  Urology: No CVA tenderness. Foley draining dark red urin Musculoskeletal:  Strength and tone appear  normal for age. Lower extremities: No peripheral edema. Neuro: Alert and oriented X 3. Moves all extremities spontaneously. Psych:  Responds to questions appropriately with a normal affect. Dialysis Access: LUE AVF + bruit  Allergies  Allergen Reactions  . Gabapentin Swelling   Prior to Admission medications   Medication Sig Start Date End Date Taking? Authorizing Provider  amitriptyline (ELAVIL) 100 MG tablet Take 100 mg by  mouth at bedtime. 08/22/16  Yes [provider]  atorvastatin (LIPITOR) 40 MG tablet Take 40 mg by mouth daily. 11/02/17  Yes [provider]  Calcium Carbonate Antacid (TUMS PO) Take 4 tablets by mouth at bedtime. chewables   Yes [provider]  Darbepoetin Alfa 40 MCG/ML SOLN Inject 1 mL into the skin every 7 (seven) days. 12/02/17  Yes [provider]  DULoxetine (CYMBALTA) 30 MG capsule Take 30 mg by mouth daily. 12/13/18  Yes [provider]  ferric citrate (AURYXIA) 1 GM 210 MG(Fe) tablet Take 420 mg by mouth 3 (three) times daily with meals. 12/22/18  Yes [provider]  furosemide (LASIX) 80 MG tablet Take 80 mg by mouth 4 (four) times a week. Mondays, wednesdays, fridays, and sundays 12/30/18  Yes [provider]  gemfibrozil (LOPID) 600 MG tablet Take 600 mg by mouth 2 (two) times daily. 01/05/19  Yes [provider]  glipiZIDE (GLUCOTROL) 10 MG tablet Take 10 mg by mouth daily. 11/30/18  Yes [provider]  hydrALAZINE (APRESOLINE) 100 MG tablet Take 50 mg by mouth 3 (three) times daily. 11/15/18  Yes [provider]  Insulin Glargine (LANTUS SOLOSTAR) 100 UNIT/ML Solostar Pen Inject 40 Units into the skin daily as needed. 04/13/14  Yes [provider]  isosorbide mononitrate (IMDUR) 60 MG 24 hr tablet Take 60 mg by mouth daily. 12/13/18  Yes [provider]  levothyroxine (SYNTHROID) 50 MCG tablet Take 50 mcg by mouth daily. 07/10/17  Yes [provider]  lidocaine-prilocaine (EMLA) cream Apply 1 application topically See admin instructions. Apply small amount to access site 1-2 hours before dialysis, cover with occlusive dressing (saran wrap) 12/15/18  Yes [provider]  LINZESS 72 MCG capsule Take 72 mcg by mouth daily. 12/30/18  Yes [provider]  metoprolol tartrate (LOPRESSOR) 25 MG tablet Take 25 mg by mouth daily. 11/25/18  Yes [provider]   NIFEdipine (ADALAT CC) 90 MG 24 hr tablet Take 90 mg by mouth daily before breakfast. 30 minutes before breakfast 10/08/18  Yes [provider]  rosuvastatin (CRESTOR) 40 MG tablet Take 40 mg by mouth daily. 08/22/16  Yes [provider]   Current Facility-Administered Medications  Medication Dose Route Frequency Provider Last Rate Last Dose  . 0.9 %  sodium chloride infusion (Manually program via Guardrails IV Fluids)   Intravenous Once Karmen Bongo, MD      . 0.9 %  sodium chloride infusion (Manually program via Guardrails IV Fluids)   Intravenous Once Alma Friendly, MD      . acetaminophen (TYLENOL) tablet 650 mg  650 mg Oral Q6H PRN Karmen Bongo, MD   650 mg at 01/06/19 1058   Or  . acetaminophen (TYLENOL) suppository 650 mg  650 mg Rectal Q6H PRN Karmen Bongo, MD      . amitriptyline (ELAVIL) tablet 100 mg  100 mg Oral Ivery Quale, MD   100 mg at 01/05/19 2248  . atorvastatin (LIPITOR) tablet 40 mg  40 mg Oral Daily Karmen Bongo, MD  40 mg at 01/06/19 0834  . calcium carbonate (dosed in mg elemental calcium) suspension 500 mg of elemental calcium  500 mg of elemental calcium Oral Q6H PRN Karmen Bongo, MD      . camphor-menthol [facility]) lotion 1 application  1 application Topical D6U PRN Karmen Bongo, MD       And  . hydrOXYzine (ATARAX/VISTARIL) tablet 25 mg  25 mg Oral Q8H PRN Karmen Bongo, MD      . docusate sodium [facility]) enema 283 mg  1 enema Rectal PRN Karmen Bongo, MD      . DULoxetine (CYMBALTA) DR capsule 30 mg  30 mg Oral Daily Karmen Bongo, MD   30 mg at 01/06/19 0835  . feeding supplement (NEPRO CARB STEADY) liquid 237 mL  237 mL Oral TID PRN Karmen Bongo, MD      . hydrALAZINE (APRESOLINE) tablet 50 mg  50 mg Oral TID Karmen Bongo, MD   50 mg at 01/05/19 2248  . insulin aspart (novoLOG) injection 0-5 Units  0-5 Units Subcutaneous QHS Karmen Bongo, MD      . insulin aspart (novoLOG) injection 0-9 Units  0-9  Units Subcutaneous TID WC Karmen Bongo, MD   2 Units at 01/06/19 [phone]  . insulin glargine (LANTUS) injection 20 Units  20 Units Subcutaneous Q2200 Karmen Bongo, MD   20 Units at 01/05/19 2248  . isosorbide mononitrate (IMDUR) 24 hr tablet 60 mg  60 mg Oral Daily Karmen Bongo, MD      . levothyroxine (SYNTHROID) tablet 50 mcg  50 mcg Oral Q0600 Karmen Bongo, MD   50 mcg at 01/06/19 0605  . ondansetron (ZOFRAN) tablet 4 mg  4 mg Oral Q6H PRN Karmen Bongo, MD       Or  . ondansetron [facility]) injection 4 mg  4 mg Intravenous Q6H PRN Karmen Bongo, MD      . sodium chloride flush (NS) 0.9 % injection 3 mL  3 mL Intravenous Q12H Karmen Bongo, MD   3 mL at 01/06/19 0835  . sorbitol 70 % solution 30 mL  30 mL Oral PRN Karmen Bongo, MD      . zolpidem [facility]) tablet 5 mg  5 mg Oral QHS PRN Karmen Bongo, MD       Labs: Basic Metabolic Panel: Recent Labs  Lab 01/05/19 1340 01/06/19 0639  NA 133* 134*  K 4.9 4.3  CL 98 99  CO2 18* 20*  GLUCOSE 255* 108*  BUN 43* 53*  CREATININE 6.98* 8.80*  CALCIUM 8.0* 7.2*   Liver Function Tests: Recent Labs  Lab 01/05/19 1340  AST 25  ALT <5  ALKPHOS 114  BILITOT 1.1  PROT 5.9*  ALBUMIN 2.7*   CBC: Recent Labs  Lab 01/05/19 1340 01/06/19 0639  WBC 16.6* 13.7*  NEUTROABS 14.4*  --   HGB 6.8* 6.7*  HCT 22.3* 20.9*  MCV 101.8* 97.7  PLT 223 202   CBG: Recent Labs  Lab 01/05/19 1919 01/05/19 2129 01/06/19 0744  GLUCAP 143* 187* 159*   Studies/Results: Dg Chest 2 View  Result Date: 01/05/2019 CLINICAL DATA:  Weakness, pain, fall EXAM: CHEST - 2 VIEW COMPARISON:  Chest radiograph 08/30/2018, chest CT 09/23/2017 FINDINGS: Accounting for body habitus, the lungs are clear. No consolidation, features of edema, pneumothorax, or effusion. Pulmonary vascularity is normally distributed. The cardiomediastinal contours are stable from prior studies. No acute osseous or soft tissue abnormality. Degenerative changes are  present in the and imaged spine and shoulders. IMPRESSION: No active  cardiopulmonary disease or acute traumatic abnormality. Electronically Signed   By: Lovena Le M.D.   On: 01/05/2019 14:33   Dg Elbow Complete Left  Result Date: 01/05/2019 CLINICAL DATA:  Fall, weakness, left elbow abrasion EXAM: LEFT ELBOW - COMPLETE 3+ VIEW COMPARISON:  None. FINDINGS: Small nondisplaced fracture of the coronoid process. Small left elbow joint effusion is present. There is circumferential soft tissue swelling of the elbow. No additional fracture or traumatic malalignment. Vascular calcium is seen in the soft tissues. IMPRESSION: Small nondisplaced fracture of the coronoid process. Electronically Signed   By: Lovena Le M.D.   On: 01/05/2019 14:37   Ct Head Wo Contrast  Result Date: 01/05/2019 CLINICAL DATA:  Syncope EXAM: CT HEAD WITHOUT CONTRAST TECHNIQUE: Contiguous axial images were obtained from the base of the skull through the vertex without intravenous contrast. COMPARISON:  CT head 08/20/2018 FINDINGS: Brain: Mild atrophy. Negative for acute infarct, hemorrhage, mass. No midline shift. Mild hypodensity in the periventricular white matter left frontal lobe. 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 Mild mucosal edema paranasal sinuses. Other: None IMPRESSION: No acute intracranial abnormality and no change from the prior study. Electronically Signed   By: Franchot Gallo M.D.   On: 01/05/2019 19:13   Dg Knee Complete 4 Views Left  Result Date: 01/05/2019 CLINICAL DATA:  Weakness, pain, fall. Left knee pain. EXAM: LEFT KNEE - COMPLETE 4+ VIEW COMPARISON:  None. FINDINGS: Severe tricompartmental osteoarthrosis most pronounced in the medial femorotibial compartment where there is bone-on-bone contact though evaluation of the alignment is limited on nonweightbearing views. No acute fracture or traumatic malalignment. The lateral  radiograph is suboptimally projected limiting evaluation for small effusion. No large effusion is seen. Minimal soft tissue thickening anterior to the knee. Vascular calcium seen posteriorly. IMPRESSION: Severe tricompartmental osteoarthrosis. No definite acute osseous abnormality. Electronically Signed   By: Lovena Le M.D.   On: 01/05/2019 14:35   Ct Renal Stone Study  Result Date: 01/05/2019 CLINICAL DATA:  Hematuria. EXAM: CT ABDOMEN AND PELVIS WITHOUT CONTRAST TECHNIQUE: Multidetector CT imaging of the abdomen and pelvis was performed following the standard protocol without IV contrast. COMPARISON:  None. FINDINGS: Lower chest: No acute abnormality. Coronary artery calcification. Heart size is normal. No pericardial effusion. Lung bases are clear. Hepatobiliary: There is a vague low-density lesion in the inferior aspect of the right lobe of the liver measuring approximately 2 cm in diameter. This correlates with the hyperechoic lesion on ultrasound and probably represents a benign hemangioma. Liver parenchyma is otherwise normal. Biliary tree is normal. Pancreas: Normal. Spleen: Normal. Adrenals/Urinary Tract: Adrenal glands are normal. 2.6 cm exophytic cyst on the lateral aspect of the mid left kidney. 3.2 cm exophytic cyst on the lower pole the right kidney. Slight prominence of the renal collecting systems bilaterally including dilatation of the ureters in the pelvis. The bladder is markedly distended with a huge mass or clot within the bladder. The mass measures 16 cm in diameter. Foley catheter is in place at the bladder base. Stomach/Bowel: The bowel is normal. Appendix has been removed. Vascular/Lymphatic: Aortic atherosclerosis. No enlarged abdominal or pelvic lymph nodes. Reproductive: Prostate is unremarkable. Other: No abdominal wall hernia or abnormality. No abdominopelvic ascites. Musculoskeletal: The entire body of the T11 vertebra is sclerotic. There is also sclerosis of the anterior aspect  of T10. There are several tiny sclerotic lesions in the pelvic bones. IMPRESSION: 1. Massively distended bladder with a huge mass or clot  within the bladder. 2. Sclerotic lesions in the T11 and T10 vertebral bodies consistent with metastatic disease. Tiny sclerotic lesions in the pelvic bones may also represent metastatic disease. This is most commonly associated with prostate cancer. 3. Slight prominence of the renal collecting systems bilaterally including the ureters in the pelvis this is felt to be due to compression of the ureters by the markedly distended urinary bladder. Aortic Atherosclerosis (ICD10-I70.0). Electronically Signed   By: Lorriane Shire M.D.   On: 01/05/2019 19:22    Dialysis Orders: Center: [facility]  on TTS. Time:4h 73min; 180NRe; BFR 400/DFR 700; EDW 136kg; 2K/3Ca; heparin 4450 unit bolus Mircera 100 mcg IV q 2 weeks- last 12/14/2018 Calcitriol 6mcg PO TIW Venofer 100mg  IV q HD(last dose 8/4) Tums 400mg  2QAC BID, 1 c snack, 4 QHS Auryxia 210mg  2tabs AC   Assessment/Plan: 1.  Hematuria/Anemia: Urology following. CT showed massively dilated bladder with huge mass or clot within the bladder, sclerotic lesions in the vertebral bodies consistent with metastatic disease. FOBT negative, hematuria as above. Hemoglobin 6.7 this AM. 2 units PRBC already ordered. Due for ESA, will order aranesp 100 mg Q Thursday. Recent course of  venofer at outpatient HD center.  2. Hydronephrosis: CT revealed slight prominence of renal collecting systems felt to be due to compression of the ureters by bladder distention. 3.  ESRD:  TTS- next dialysis today. K+ 4.3. Reports good compliance with dialysis, no uremic symptoms. No heparin with dialysis.  4.  Hypertension/volume: Does not appear volume overloaded on exam, currently 4kg below EDW by weights here. Will plan for HD today per regular schedule. UFG 1-2L, will keep systolic BP >706 due to anemia.  5.  Metabolic bone disease: Calcium  7.2, corrected 8.2. Was running in the 6's at outpatient unit and was taking Tums 4 QHS per most recent note- will order here and continue high Ca bath. Continue auryxia.  6.  Nutrition:  Renal diet with fluid restrictions  Anice Paganini, PA-C 01/06/2019, 11:14 AM  [facility] Pager: [phone]  Pt seen, examined and agree w A/P as above.  Kelly Splinter  MD 01/06/2019, 1:21 PM

## 2019-01-07 ENCOUNTER — Inpatient Hospital Stay (HOSPITAL_COMMUNITY): Payer: Medicare Other | Admitting: Anesthesiology

## 2019-01-07 ENCOUNTER — Encounter (HOSPITAL_COMMUNITY)
Admission: EM | Disposition: A | Discharge status: Home Health | Payer: Self-pay | Source: Home / Self Care | Attending: Internal Medicine

## 2019-01-07 HISTORY — PX: TRANSURETHRAL RESECTION OF BLADDER TUMOR: SHX2575

## 2019-01-07 LAB — POCT I-STAT EG7
Bicarbonate: 24.5 mmol/L (ref 20.0–28.0)
Calcium, Ion: 0.98 mmol/L — ABNORMAL LOW (ref 1.15–1.40)
HCT: 24 % — ABNORMAL LOW (ref 39.0–52.0)
Hemoglobin: 8.2 g/dL — ABNORMAL LOW (ref 13.0–17.0)
O2 Saturation: 81 %
Potassium: 4.6 mmol/L (ref 3.5–5.1)
Sodium: 134 mmol/L — ABNORMAL LOW (ref 135–145)
TCO2: 26 mmol/L (ref 22–32)
pCO2, Ven: 37.5 mmHg — ABNORMAL LOW (ref 44.0–60.0)
pH, Ven: 7.423 (ref 7.250–7.430)
pO2, Ven: 44 mmHg (ref 32.0–45.0)

## 2019-01-07 LAB — CBC WITH DIFFERENTIAL/PLATELET
Abs Immature Granulocytes: 0.35 10*3/uL — ABNORMAL HIGH (ref 0.00–0.07)
Basophils Absolute: 0 10*3/uL (ref 0.0–0.1)
Basophils Relative: 0 %
Eosinophils Absolute: 0 10*3/uL (ref 0.0–0.5)
Eosinophils Relative: 0 %
HCT: 23.6 % — ABNORMAL LOW (ref 39.0–52.0)
Hemoglobin: 7.6 g/dL — ABNORMAL LOW (ref 13.0–17.0)
Immature Granulocytes: 2 %
Lymphocytes Relative: 6 %
Lymphs Abs: 1.1 10*3/uL (ref 0.7–4.0)
MCH: 31 pg (ref 26.0–34.0)
MCHC: 32.2 g/dL (ref 30.0–36.0)
MCV: 96.3 fL (ref 80.0–100.0)
Monocytes Absolute: 1.3 10*3/uL — ABNORMAL HIGH (ref 0.1–1.0)
Monocytes Relative: 8 %
Neutro Abs: 13.8 10*3/uL — ABNORMAL HIGH (ref 1.7–7.7)
Neutrophils Relative %: 84 %
Platelets: 213 10*3/uL (ref 150–400)
RBC: 2.45 MIL/uL — ABNORMAL LOW (ref 4.22–5.81)
RDW: 15.9 % — ABNORMAL HIGH (ref 11.5–15.5)
WBC: 16.5 10*3/uL — ABNORMAL HIGH (ref 4.0–10.5)
nRBC: 0.2 % (ref 0.0–0.2)

## 2019-01-07 LAB — URINE CULTURE: Culture: NO GROWTH

## 2019-01-07 LAB — BASIC METABOLIC PANEL
Anion gap: 17 — ABNORMAL HIGH (ref 5–15)
BUN: 72 mg/dL — ABNORMAL HIGH (ref 8–23)
CO2: 18 mmol/L — ABNORMAL LOW (ref 22–32)
Calcium: 7.3 mg/dL — ABNORMAL LOW (ref 8.9–10.3)
Chloride: 96 mmol/L — ABNORMAL LOW (ref 98–111)
Creatinine, Ser: 10.38 mg/dL — ABNORMAL HIGH (ref 0.61–1.24)
GFR calc Af Amer: 5 mL/min — ABNORMAL LOW (ref >60)
GFR calc non Af Amer: 5 mL/min — ABNORMAL LOW (ref >60)
Glucose, Bld: 182 mg/dL — ABNORMAL HIGH (ref 70–99)
Potassium: 5.2 mmol/L — ABNORMAL HIGH (ref 3.5–5.1)
Sodium: 131 mmol/L — ABNORMAL LOW (ref 135–145)

## 2019-01-07 LAB — GLUCOSE, CAPILLARY
Glucose-Capillary: 179 mg/dL — ABNORMAL HIGH (ref 70–99)
Glucose-Capillary: 133 mg/dL — ABNORMAL HIGH (ref 70–99)
Glucose-Capillary: 140 mg/dL — ABNORMAL HIGH (ref 70–99)
Glucose-Capillary: 152 mg/dL — ABNORMAL HIGH (ref 70–99)
Glucose-Capillary: 146 mg/dL — ABNORMAL HIGH (ref 70–99)

## 2019-01-07 LAB — PREPARE RBC (CROSSMATCH)

## 2019-01-07 SURGERY — TURBT (TRANSURETHRAL RESECTION OF BLADDER TUMOR)
Anesthesia: General | Site: Bladder

## 2019-01-07 MED ORDER — FENTANYL CITRATE (PF) 250 MCG/5ML IJ SOLN
INTRAMUSCULAR | Status: AC
  Filled 2019-01-07: qty 5

## 2019-01-07 MED ORDER — DARBEPOETIN ALFA 200 MCG/0.4ML IJ SOSY: 200.0000 ug | PREFILLED_SYRINGE | INTRAMUSCULAR | Status: DC

## 2019-01-07 MED ORDER — LIDOCAINE HCL URETHRAL/MUCOSAL 2 % EX GEL
CUTANEOUS | Status: AC
  Filled 2019-01-07: qty 5

## 2019-01-07 MED ORDER — DARBEPOETIN ALFA 100 MCG/0.5ML IJ SOSY
PREFILLED_SYRINGE | INTRAMUSCULAR | Status: AC
  Filled 2019-01-07: qty 0.5

## 2019-01-07 MED ORDER — FLEET ENEMA 7-19 GM/118ML RE ENEM
1.0000 | ENEMA | Freq: Once | RECTAL | Status: AC
  Administered 2019-01-07: 1 via RECTAL
  Filled 2019-01-07 (×2): qty 1

## 2019-01-07 MED ORDER — LABETALOL HCL 5 MG/ML IV SOLN: 5.0000 mg | Freq: Once | INTRAVENOUS | Status: DC

## 2019-01-07 MED ORDER — DARBEPOETIN ALFA 100 MCG/0.5ML IJ SOSY
100.0000 ug | PREFILLED_SYRINGE | Freq: Once | INTRAMUSCULAR | Status: AC
  Administered 2019-01-07: 100 ug via INTRAVENOUS
  Filled 2019-01-07: qty 0.5

## 2019-01-07 MED ORDER — DARBEPOETIN ALFA 100 MCG/0.5ML IJ SOSY
100.0000 ug | PREFILLED_SYRINGE | INTRAMUSCULAR | Status: AC
  Administered 2019-01-07: 100 ug via INTRAVENOUS
  Filled 2019-01-07: qty 0.5

## 2019-01-07 MED ORDER — LIDOCAINE HCL URETHRAL/MUCOSAL 2 % EX GEL
CUTANEOUS | Status: DC | PRN
  Administered 2019-01-07: 1 via URETHRAL

## 2019-01-07 MED ORDER — SODIUM CHLORIDE 0.9 % IR SOLN
Status: DC | PRN
  Administered 2019-01-07: 12000 mL via INTRAVESICAL

## 2019-01-07 MED ORDER — LABETALOL HCL 5 MG/ML IV SOLN
5.0000 mg | Freq: Once | INTRAVENOUS | Status: AC
  Administered 2019-01-07: 5 mg via INTRAVENOUS
  Filled 2019-01-07: qty 4

## 2019-01-07 MED ORDER — FENTANYL CITRATE (PF) 100 MCG/2ML IJ SOLN: 25.0000 ug | INTRAMUSCULAR | Status: DC | PRN

## 2019-01-07 MED ORDER — 0.9 % SODIUM CHLORIDE (POUR BTL) OPTIME
TOPICAL | Status: DC | PRN
  Administered 2019-01-07: 1000 mL

## 2019-01-07 MED ORDER — PHENYLEPHRINE HCL (PRESSORS) 10 MG/ML IV SOLN
INTRAVENOUS | Status: DC | PRN
  Administered 2019-01-07: 80 ug via INTRAVENOUS

## 2019-01-07 MED ORDER — DARBEPOETIN ALFA 100 MCG/0.5ML IJ SOSY: 100.0000 ug | PREFILLED_SYRINGE | INTRAMUSCULAR | Status: DC

## 2019-01-07 MED ORDER — ONDANSETRON HCL 4 MG/2ML IJ SOLN: 4.0000 mg | Freq: Once | INTRAMUSCULAR | Status: DC | PRN

## 2019-01-07 MED ORDER — DEXAMETHASONE SODIUM PHOSPHATE 10 MG/ML IJ SOLN
INTRAMUSCULAR | Status: AC
  Filled 2019-01-07: qty 1

## 2019-01-07 MED ORDER — SUCCINYLCHOLINE CHLORIDE 200 MG/10ML IV SOSY
PREFILLED_SYRINGE | INTRAVENOUS | Status: AC
  Filled 2019-01-07: qty 10

## 2019-01-07 MED ORDER — CIPROFLOXACIN IN D5W 400 MG/200ML IV SOLN
400.0000 mg | INTRAVENOUS | Status: AC
  Administered 2019-01-07: 400 mg via INTRAVENOUS
  Filled 2019-01-07: qty 200

## 2019-01-07 MED ORDER — CISATRACURIUM BESYLATE 20 MG/10ML IV SOLN
INTRAVENOUS | Status: AC
  Filled 2019-01-07: qty 10

## 2019-01-07 MED ORDER — SUCCINYLCHOLINE CHLORIDE 20 MG/ML IJ SOLN
INTRAMUSCULAR | Status: DC | PRN
  Administered 2019-01-07: 120 mg via INTRAVENOUS

## 2019-01-07 MED ORDER — PROPOFOL 10 MG/ML IV BOLUS
INTRAVENOUS | Status: DC | PRN
  Administered 2019-01-07: 120 mg via INTRAVENOUS
  Administered 2019-01-07: 30 mg via INTRAVENOUS

## 2019-01-07 MED ORDER — LABETALOL HCL 5 MG/ML IV SOLN
INTRAVENOUS | Status: AC
  Filled 2019-01-07: qty 4

## 2019-01-07 MED ORDER — ACETAMINOPHEN 325 MG PO TABS
650.0000 mg | ORAL_TABLET | Freq: Once | ORAL | Status: AC
  Administered 2019-01-07: 650 mg via ORAL
  Filled 2019-01-07: qty 2

## 2019-01-07 MED ORDER — SODIUM CHLORIDE 0.9 % IV SOLN: 1.0000 g | INTRAVENOUS | Status: DC

## 2019-01-07 MED ORDER — CEFAZOLIN SODIUM-DEXTROSE 2-4 GM/100ML-% IV SOLN: 2.0000 g | INTRAVENOUS | Status: DC

## 2019-01-07 MED ORDER — METOPROLOL TARTRATE 25 MG PO TABS
25.0000 mg | ORAL_TABLET | Freq: Once | ORAL | Status: AC
  Administered 2019-01-07: 25 mg via ORAL
  Filled 2019-01-07: qty 1

## 2019-01-07 MED ORDER — SODIUM CHLORIDE 0.9% IV SOLUTION: Freq: Once | INTRAVENOUS | Status: DC

## 2019-01-07 MED ORDER — DARBEPOETIN ALFA 100 MCG/0.5ML IJ SOSY
PREFILLED_SYRINGE | INTRAMUSCULAR | Status: AC
  Administered 2019-01-07: 100 ug via INTRAVENOUS
  Filled 2019-01-07: qty 0.5

## 2019-01-07 MED ORDER — LIDOCAINE HCL (CARDIAC) PF 100 MG/5ML IV SOSY
PREFILLED_SYRINGE | INTRAVENOUS | Status: DC | PRN
  Administered 2019-01-07: 80 mg via INTRAVENOUS

## 2019-01-07 MED ORDER — ONDANSETRON HCL 4 MG/2ML IJ SOLN
INTRAMUSCULAR | Status: AC
  Filled 2019-01-07: qty 2

## 2019-01-07 MED ORDER — DEXAMETHASONE SODIUM PHOSPHATE 10 MG/ML IJ SOLN
INTRAMUSCULAR | Status: DC | PRN
  Administered 2019-01-07: 10 mg via INTRAVENOUS

## 2019-01-07 MED ORDER — SODIUM CHLORIDE 0.9 % IV SOLN
INTRAVENOUS | Status: DC
  Administered 2019-01-07 – 2019-01-10 (×2): via INTRAVENOUS

## 2019-01-07 MED ORDER — ACETAMINOPHEN 325 MG PO TABS
650.0000 mg | ORAL_TABLET | Freq: Once | ORAL | Status: DC
  Filled 2019-01-07 (×3): qty 2

## 2019-01-07 MED ORDER — FENTANYL CITRATE (PF) 100 MCG/2ML IJ SOLN
INTRAMUSCULAR | Status: DC | PRN
  Administered 2019-01-07 (×2): 50 ug via INTRAVENOUS
  Administered 2019-01-07 (×2): 25 ug via INTRAVENOUS

## 2019-01-07 MED ORDER — MIDAZOLAM HCL 2 MG/2ML IJ SOLN
INTRAMUSCULAR | Status: AC
  Filled 2019-01-07: qty 2

## 2019-01-07 MED ORDER — PROPOFOL 10 MG/ML IV BOLUS
INTRAVENOUS | Status: AC
  Filled 2019-01-07: qty 20

## 2019-01-07 SURGICAL SUPPLY — 21 items
BAG URINE DRAINAGE (UROLOGICAL SUPPLIES) ×1 IMPLANT
BAG URO CATCHER STRL LF (MISCELLANEOUS) ×2 IMPLANT
CATH AINSWORTH 30CC 24FR (CATHETERS) IMPLANT
CATH HEMA 3WAY 30CC 24FR COUDE (CATHETERS) ×1 IMPLANT
COVER WAND RF STERILE (DRAPES) IMPLANT
ELECT REM PT RETURN 15FT ADLT (MISCELLANEOUS) ×1 IMPLANT
EVACUATOR ELLICK (MISCELLANEOUS) ×1 IMPLANT
GLOVE BIOGEL M STRL SZ7.5 (GLOVE) ×2 IMPLANT
GOWN STRL REUS W/TWL XL LVL3 (GOWN DISPOSABLE) ×2 IMPLANT
HOLDER FOLEY CATH W/STRAP (MISCELLANEOUS) ×1 IMPLANT
KIT TURNOVER KIT A (KITS) ×1 IMPLANT
LOOP CUT BIPOLAR 24F LRG (ELECTROSURGICAL) ×1 IMPLANT
MANIFOLD NEPTUNE II (INSTRUMENTS) ×2 IMPLANT
NS IRRIG 1000ML POUR BTL (IV SOLUTION) ×1 IMPLANT
PACK CYSTO (CUSTOM PROCEDURE TRAY) ×2 IMPLANT
SET ASPIRATION TUBING (TUBING) ×1 IMPLANT
SYR 30ML LL (SYRINGE) ×1 IMPLANT
SYRINGE IRR TOOMEY STRL 70CC (SYRINGE) ×2 IMPLANT
TUBING CONNECTING 10 (TUBING) ×2 IMPLANT
TUBING UROLOGY SET (TUBING) ×2 IMPLANT
WATER STERILE IRR 500ML POUR (IV SOLUTION) ×1 IMPLANT

## 2019-01-07 NOTE — Progress Notes (Signed)
Pt refusing CPAP at this time. I told him to call RT if he changes his mind

## 2019-01-07 NOTE — Anesthesia Procedure Notes (Signed)
Procedure Name: Intubation Date/Time: 01/07/2019 3:37 PM Performed by: Glory Buff, CRNA Pre-anesthesia Checklist: Patient identified, Emergency Drugs available, Suction available and Patient being monitored Patient Re-evaluated:Patient Re-evaluated prior to induction Oxygen Delivery Method: Circle system utilized Preoxygenation: Pre-oxygenation with 100% oxygen Induction Type: IV induction Laryngoscope Size: Miller and 3 Grade View: Grade I Tube type: Oral Tube size: 7.5 mm Number of attempts: 1 Airway Equipment and Method: Stylet and Oral airway Placement Confirmation: ETT inserted through vocal cords under direct vision,  positive ETCO2 and breath sounds checked- equal and bilateral Secured at: 21 cm Tube secured with: Tape Dental Injury: Teeth and Oropharynx as per pre-operative assessment

## 2019-01-07 NOTE — Anesthesia Postprocedure Evaluation (Signed)
Anesthesia Post Note  Patient: Richard Hall  Procedure(s) Performed: CYSTOSCOPY/ EVEACUATION CLOT (N/A Bladder)     Patient location during evaluation: PACU Anesthesia Type: General Level of consciousness: awake and alert Pain management: pain level controlled Vital Signs Assessment: post-procedure vital signs reviewed and stable Respiratory status: spontaneous breathing, nonlabored ventilation, respiratory function stable and patient connected to nasal cannula oxygen Cardiovascular status: blood pressure returned to baseline and stable Postop Assessment: no apparent nausea or vomiting Anesthetic complications: no    Last Vitals:  Vitals:   01/07/19 1812 01/07/19 1815  BP: (!) 143/73 (!) 147/68  Pulse: 77 77  Resp: 18 20  Temp: 36.6 C   SpO2: 99% 100%    Last Pain:  Vitals:   01/07/19 1815  TempSrc:   PainSc: 0-No pain                 Barnet Glasgow

## 2019-01-07 NOTE — Progress Notes (Signed)
[facility] at bedside to transport pt. Burman Riis, RN given report on pt's care in PACU.

## 2019-01-07 NOTE — Progress Notes (Signed)
PROGRESS NOTE  Richard Hall SWN:[id number] DOB: Apr 28, 1956 DOA: 01/05/2019 PCP: [name], MD  HPI/Recap of past 24 hours: HPI from Dr Richard Hall is a 63 y.o. male with medical history significant of constipation; HLD; depression; DM;afib (patient denies, not on [facility]); hypothyroidism; OSA on CPAP; HTN; and ESRD on TTS HD since January presenting with dizziness.  He went to [facility] yesterday for urinary retention.  They put a foley in and his urine has been increasingly bloody.  He became dizzy with standing and walking.  He got so dizzy while walking that he collapsed to the floor.  He was too weak to walk but did not lose consciousness.  No SOB.  No fever.  He has not noticed any blood in his stools.  The urine is "all blood", clotting the bag.  He pees daily and was peeing less than usual yesterday. Pt was seen yesterday in the [facility] ER. A prior bladder scan on 7/20 showed 4129 cc of urine in his bladder.  Renal US from 7/30 showed moderate R and mild L hydronephrosis with evidence of chronic medical renal disease. Pt had normal EGD and colonoscopy and video capsule endoscopy showed 1 AVM.  Last visit was in 12/19. In the ED, pt hypotensive with EMS, received 500 cc IVF.  Mentating well despite low BP.  Hgb 6.8, looks pale; prior Hgb 10.  Stool dark black, received 1 unit emergency release blood.  Denies h/o GI bleed.  No NSAIDs.  Urology consulted.  Patient admitted for further management.    Foley stopped draining overnight, noted significant clot burden with significant bladder spasms and leaking around Foley.  Urology exchanged Foley without any success due to large clots.  Patient transferred to [facility] long for cystoscopy, clot evacuation and subsequent replacement of Foley catheter.  Patient to be transferred back to [facility], patient will require dialysis on 01/08/2019.   Assessment/Plan: Principal Problem:   Symptomatic anemia Active Problems:   OSA on CPAP   Hypothyroidism  (acquired)   Essential hypertension   Dyslipidemia   Diabetes mellitus with end-stage renal disease (HCC)   Gross hematuria   Gastrointestinal hemorrhage with melena   Acute on chronic symptomatic blood loss anemia likely 2/2 gross hematuria s/p cystoscopy, clot evacuation on 01/07/2019 Overnight, Foley stopped draining due to significant amount of huge clots Baseline hemoglobin around 10-11 So far has received 4 units of P RBC UA was turbid and significantly red, UC with no growth CT stone showed massively distended bladder with a huge mass or clot within the bladder, slight prominence of the renal collecting systems bilaterally including the ureters due to compression of the ureters by the markedly distended urinary bladder.  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 cancer Urology on board, transferred to [name] long for cystoscopy, clot evacuation and subsequent replacement of Foley catheter in the OR Noted to have intra-operative hemoglobin of 6.2, plan to transfuse 1 units of packed RBC prior to transfer to [name] very closely Daily CBC  Leukocytosis Afebrile, ongoing, likely reactive Checks x-ray unremarkable UC no growth Daily CBC  Near syncope Likely secondary to acute on chronic blood loss as mentioned above CT head unremarkable Monitor closely  Unlikely GI bleeding Had extensive GI work-up in May and September 2019 FOBT repeated by GI negative Dark stools likely due to iron supplementation GI consulted, no further work-up from the standpoint  ESRD on HD  T/TH/S Nephrology on board  Diabetes mellitus type 2 A1c 5.9 SSI, Lantus, hypoglycemic protocol, Accu-Cheks  Hypertension Continue hydralazine, imdur  Hypothyroidism Continue Synthroid  Obesity Lifestyle modification advised        Malnutrition Type:      Malnutrition Characteristics:      Nutrition  Interventions:       Estimated body mass index is 39.53 kg/m as calculated from the following:   Height as of this encounter: 6' (1.829 m).   Weight as of this encounter: 132.2 kg.     Code Status: Full  Family Communication: None at bedside  Disposition Plan: TBD   Consultants:  Urology  Nephrology  GI  Procedures:  Cystoscopy, clot evacuation on 01/07/2019  Antimicrobials:  None  DVT prophylaxis: SCDs   Objective: Vitals:   01/07/19 1100 01/07/19 1115 01/07/19 1318 01/07/19 1648  BP: 128/67 (!) 145/77 (!) 158/82 (!) 145/74  Pulse: 98 96 96 80  Resp: 20 (!) 25 20 (!) 25  Temp:  98.1 F (36.7 C) 97.8 F (36.6 C) 98.3 F (36.8 C)  TempSrc:  Oral Oral   SpO2:  99% 98% 100%  Weight:  132.2 kg    Height:        Intake/Output Summary (Last 24 hours) at 01/07/2019 1709 Last data filed at 01/07/2019 1651 Gross per 24 hour  Intake 4370 ml  Output 18100 ml  Net -13730 ml   Filed Weights   01/07/19 0453 01/07/19 0745 01/07/19 1115  Weight: 133.7 kg 133.7 kg 132.2 kg    Exam:  General: NAD   Cardiovascular: S1, S2 present  Respiratory: CTAB  Abdomen: Soft, nontender, nondistended, bowel sounds present  Musculoskeletal: No bilateral pedal edema noted  Skin: Normal  Psychiatry: Normal mood    Data Reviewed: CBC: Recent Labs  Lab 01/05/19 1340 01/06/19 0639 01/07/19 0327 01/07/19 1423  WBC 16.6* 13.7* 16.5*  --   NEUTROABS 14.4*  --  13.8*  --   HGB 6.8* 6.7* 7.6* 8.2*  HCT 22.3* 20.9* 23.6* 24.0*  MCV 101.8* 97.7 96.3  --   PLT 223 202 213  --    Basic Metabolic Panel: Recent Labs  Lab 01/05/19 1340 01/06/19 0639 01/07/19 0327 01/07/19 1423  NA 133* 134* 131* 134*  K 4.9 4.3 5.2* 4.6  CL 98 99 96*  --   CO2 18* 20* 18*  --   GLUCOSE 255* 108* 182*  --   BUN 43* 53* 72*  --   CREATININE 6.98* 8.80* 10.38*  --   CALCIUM 8.0* 7.2* 7.3*  --    GFR: Estimated Creatinine Clearance: 10.2 mL/min (A) (by C-G formula based on SCr  of 10.38 mg/dL (H)). Liver Function Tests: Recent Labs  Lab 01/05/19 1340  AST 25  ALT <5  ALKPHOS 114  BILITOT 1.1  PROT 5.9*  ALBUMIN 2.7*   No results for input(s): LIPASE, AMYLASE in the last 168 hours. No results for input(s): AMMONIA in the last 168 hours. Coagulation Profile: No results for input(s): INR, PROTIME in the last 168 hours. Cardiac Enzymes: No results for input(s): CKTOTAL, CKMB, CKMBINDEX, TROPONINI in the last 168 hours. BNP (last 3 results) No results for input(s): PROBNP in the last 8760 hours. HbA1C: Recent Labs    01/06/19 0639  HGBA1C 5.9*   CBG: Recent Labs  Lab 01/06/19 2100 01/07/19 0718 01/07/19 1307 01/07/19 1439 01/07/19 1655  GLUCAP 209* 179* 133* 140* 152*   Lipid Profile: No results for input(s): CHOL, HDL, LDLCALC,  TRIG, CHOLHDL, LDLDIRECT in the last 72 hours. Thyroid Function Tests: No results for input(s): TSH, T4TOTAL, FREET4, T3FREE, THYROIDAB in the last 72 hours. Anemia Panel: No results for input(s): VITAMINB12, FOLATE, FERRITIN, TIBC, IRON, RETICCTPCT in the last 72 hours. Urine analysis:    Component Value Date/Time   COLORURINE RED (A) 01/06/2019 1118   APPEARANCEUR TURBID (A) 01/06/2019 1118   LABSPEC  01/06/2019 1118    TEST NOT REPORTED DUE TO COLOR INTERFERENCE OF URINE PIGMENT   PHURINE  01/06/2019 1118    TEST NOT REPORTED DUE TO COLOR INTERFERENCE OF URINE PIGMENT   GLUCOSEU (A) 01/06/2019 1118    TEST NOT REPORTED DUE TO COLOR INTERFERENCE OF URINE PIGMENT   HGBUR (A) 01/06/2019 1118    TEST NOT REPORTED DUE TO COLOR INTERFERENCE OF URINE PIGMENT   BILIRUBINUR (A) 01/06/2019 1118    TEST NOT REPORTED DUE TO COLOR INTERFERENCE OF URINE PIGMENT   KETONESUR (A) 01/06/2019 1118    TEST NOT REPORTED DUE TO COLOR INTERFERENCE OF URINE PIGMENT   PROTEINUR (A) 01/06/2019 1118    TEST NOT REPORTED DUE TO COLOR INTERFERENCE OF URINE PIGMENT   NITRITE (A) 01/06/2019 1118    TEST NOT REPORTED DUE TO COLOR  INTERFERENCE OF URINE PIGMENT   LEUKOCYTESUR (A) 01/06/2019 1118    TEST NOT REPORTED DUE TO COLOR INTERFERENCE OF URINE PIGMENT   Sepsis Labs: @LABRCNTIP (procalcitonin:4,lacticidven:4)  ) Recent Results (from the past 240 hour(s))  SARS Coronavirus 2 [facility] order, Performed in [facility] hospital lab) Nasopharyngeal Nasopharyngeal Swab     Status: None   Collection Time: 01/05/19  5:50 PM   Specimen: Nasopharyngeal Swab  Result Value Ref Range Status   SARS Coronavirus 2 NEGATIVE NEGATIVE Final    Comment: (NOTE) If result is NEGATIVE SARS-CoV-2 target nucleic acids are NOT DETECTED. The SARS-CoV-2 RNA is generally detectable in upper and lower  respiratory specimens during the acute phase of infection. The lowest  concentration of SARS-CoV-2 viral copies this assay can detect is 250  copies / mL. A negative result does not preclude SARS-CoV-2 infection  and should not be used as the sole basis for treatment or other  patient management decisions.  A negative result may occur with  improper specimen collection / handling, submission of specimen other  than nasopharyngeal swab, presence of viral mutation(s) within the  areas targeted by this assay, and inadequate number of viral copies  (<250 copies / mL). A negative result must be combined with clinical  observations, patient history, and epidemiological information. If result is POSITIVE SARS-CoV-2 target nucleic acids are DETECTED. The SARS-CoV-2 RNA is generally detectable in upper and lower  respiratory specimens dur ing the acute phase of infection.  Positive  results are indicative of active infection with SARS-CoV-2.  Clinical  correlation with patient history and other diagnostic information is  necessary to determine patient infection status.  Positive results do  not rule out bacterial infection or co-infection with other viruses. If result is PRESUMPTIVE POSTIVE SARS-CoV-2 nucleic acids MAY BE PRESENT.   A  presumptive positive result was obtained on the submitted specimen  and confirmed on repeat testing.  While 2019 novel coronavirus  (SARS-CoV-2) nucleic acids may be present in the submitted sample  additional confirmatory testing may be necessary for epidemiological  and / or clinical management purposes  to differentiate between  SARS-CoV-2 and other Sarbecovirus currently known to infect humans.  If clinically indicated additional testing with an alternate test  methodology [phone])  is advised. The SARS-CoV-2 RNA is generally  detectable in upper and lower respiratory sp ecimens during the acute  phase of infection. The expected result is Negative. Fact Sheet for Patients:  StrictlyIdeas.no Fact Sheet for Healthcare Providers: BankingDealers.co.za This test is not yet approved or cleared by the [place] FDA and has been authorized for detection and/or diagnosis of SARS-CoV-2 by FDA under an Emergency Use Authorization (EUA).  This EUA will remain in effect (meaning this test can be used) for the duration of the COVID-19 declaration under Section 564(b)(1) of the Act, 21 U.S.C. section 360bbb-3(b)(1), unless the authorization is terminated or revoked sooner. Performed at [facility], [place] [address], [place], [place] [address]   MRSA PCR Screening     Status: None   Collection Time: 01/05/19 10:48 PM   Specimen: Nasopharyngeal  Result Value Ref Range Status   MRSA by PCR NEGATIVE NEGATIVE Final    Comment:        The GeneXpert MRSA Assay (FDA approved for NASAL specimens only), is one component of a comprehensive MRSA colonization surveillance program. It is not intended to diagnose MRSA infection nor to guide or monitor treatment for MRSA infections. Performed at [facility], [place] [address] [place], [place] [address]   Urine culture     Status: None   Collection Time: 01/06/19 11:34 AM   Specimen: Urine,  Random  Result Value Ref Range Status   Specimen Description URINE, RANDOM  Final   Special Requests NONE  Final   Culture   Final    NO GROWTH Performed at [facility], [place] [address], [place], [place] [address]    Report Status 01/07/2019 FINAL  Final      Studies: No results found.  Scheduled Meds: . [MAR Hold] sodium chloride   Intravenous Once  . sodium chloride   Intravenous Once  . acetaminophen  650 mg Oral Once  . [MAR Hold] amitriptyline  100 mg Oral QHS  . [MAR Hold] atorvastatin  40 mg Oral Daily  . [MAR Hold] calcitRIOL  1 mcg Oral Q T,Th,Sa-HD  . [MAR Hold] calcium carbonate  800 mg of elemental calcium Oral Daily  . [MAR Hold] Chlorhexidine Gluconate Cloth  6 each Topical Q0600  . Darbepoetin Alfa      . [MAR Hold] darbepoetin (ARANESP) injection - DIALYSIS  200 mcg Intravenous Q Sat-HD  . [MAR Hold] DULoxetine  30 mg Oral Daily  . [MAR Hold] ferric citrate  420 mg Oral TID WC  . [MAR Hold] hydrALAZINE  50 mg Oral TID  . [MAR Hold] insulin aspart  0-5 Units Subcutaneous QHS  . [MAR Hold] insulin aspart  0-9 Units Subcutaneous TID WC  . [MAR Hold] insulin glargine  20 Units Subcutaneous Q2200  . [MAR Hold] isosorbide mononitrate  60 mg Oral Daily  . [MAR Hold] levothyroxine  50 mcg Oral Q0600  . [MAR Hold] sodium chloride flush  3 mL Intravenous Q12H    Continuous Infusions: . sodium chloride 10 mL/hr at 01/07/19 1321     LOS: 1 day     Alma Friendly, MD [facility]  If 7PM-7AM, please contact night-coverage [email or web address] 01/07/2019, 5:09 PM

## 2019-01-07 NOTE — Interval H&P Note (Signed)
History and Physical Interval Note:  01/07/2019 3:14 PM  Richard Hall  has presented today for surgery, with the diagnosis of hematoma bladder.  The various methods of treatment have been discussed with the patient and family. After consideration of risks, benefits and other options for treatment, the patient has consented to  Procedure(s): EVEACUATION CLOT, POSSIBLE TRANSURETHRAL RESECTION OF BLADDER TUMOR (TURBT) (N/A) PROSTATE BIOPSY as a surgical intervention.  The patient's history has been reviewed, patient examined, no change in status, stable for surgery.  I have reviewed the patient's chart and labs.  Questions were answered to the patient's satisfaction.     [name] A Cobin Cadavid

## 2019-01-07 NOTE — Transfer of Care (Signed)
Immediate Anesthesia Transfer of Care Note  Patient: Richard Hall  Procedure(s) Performed: CYSTOSCOPY/ EVEACUATION CLOT (N/A Bladder)  Patient Location: PACU  Anesthesia Type:General  Level of Consciousness: drowsy  Airway & Oxygen Therapy: Patient Spontanous Breathing and Patient connected to face mask oxygen  Post-op Assessment: Report given to RN and Post -op Vital signs reviewed and stable  Post vital signs: Reviewed and stable  Last Vitals:  Vitals Value Taken Time  BP 145/74 01/07/19 1648  Temp    Pulse 80 01/07/19 1650  Resp 22 01/07/19 1650  SpO2 100 % 01/07/19 1650  Vitals shown include unvalidated device data.  Last Pain:  Vitals:   01/07/19 1318  TempSrc: Oral  PainSc:          Complications: No apparent anesthesia complications

## 2019-01-07 NOTE — Progress Notes (Addendum)
[facility] Progress Note   Subjective: Very pleasant male seen on HD. Has 3-way foley with gross hematuria present. No C/Os, tolerating HD well.     Objective Vitals:   01/07/19 1000 01/07/19 1015 01/07/19 1030 01/07/19 1045  BP: 132/64 133/74 132/78 (!) 170/95  Pulse: 94 90 96 96  Resp: 18 (!) 22 20 (!) 22  Temp:      TempSrc:      SpO2:      Weight:      Height:       Physical Exam General: Obese male in NAD Heart: HS distant, S1,S2 RRR Lungs: CTAB Abdomen: Abdomin distended, active BS. Foley patent with gross hematuria present.  Extremities: No LE edema Dialysis Access: L AVF cannulated at present   Additional Objective Labs: Basic Metabolic Panel: Recent Labs  Lab 01/05/19 1340 01/06/19 0639 01/07/19 0327  NA 133* 134* 131*  K 4.9 4.3 5.2*  CL 98 99 96*  CO2 18* 20* 18*  GLUCOSE 255* 108* 182*  BUN 43* 53* 72*  CREATININE 6.98* 8.80* 10.38*  CALCIUM 8.0* 7.2* 7.3*   Liver Function Tests: Recent Labs  Lab 01/05/19 1340  AST 25  ALT <5  ALKPHOS 114  BILITOT 1.1  PROT 5.9*  ALBUMIN 2.7*   No results for input(s): LIPASE, AMYLASE in the last 168 hours. CBC: Recent Labs  Lab 01/05/19 1340 01/06/19 0639 01/07/19 0327  WBC 16.6* 13.7* 16.5*  NEUTROABS 14.4*  --  13.8*  HGB 6.8* 6.7* 7.6*  HCT 22.3* 20.9* 23.6*  MCV 101.8* 97.7 96.3  PLT 223 202 213   Blood Culture    Component Value Date/Time   SDES URINE, RANDOM 01/06/2019 1134   SPECREQUEST NONE 01/06/2019 1134   CULT  01/06/2019 1134    NO GROWTH Performed at [facility], [place] [address], [place], [place] [address]    REPTSTATUS 01/07/2019 FINAL 01/06/2019 1134    Cardiac Enzymes: No results for input(s): CKTOTAL, CKMB, CKMBINDEX, TROPONINI in the last 168 hours. CBG: Recent Labs  Lab 01/06/19 0744 01/06/19 1126 01/06/19 1621 01/06/19 2100 01/07/19 0718  GLUCAP 159* 143* 192* 209* 179*   Iron Studies: No results for input(s): IRON, TIBC, TRANSFERRIN,  FERRITIN in the last 72 hours. @lablastinr3 @ Studies/Results: Dg Chest 2 View  Result Date: 01/05/2019 CLINICAL DATA:  Weakness, pain, fall EXAM: CHEST - 2 VIEW COMPARISON:  Chest radiograph 08/30/2018, chest CT 09/23/2017 FINDINGS: Accounting for body habitus, the lungs are clear. No consolidation, features of edema, pneumothorax, or effusion. Pulmonary vascularity is normally distributed. The cardiomediastinal contours are stable from prior studies. No acute osseous or soft tissue abnormality. Degenerative changes are present in the and imaged spine and shoulders. IMPRESSION: No active cardiopulmonary disease or acute traumatic abnormality. Electronically Signed   By: Lovena Le M.D.   On: 01/05/2019 14:33   Dg Elbow Complete Left  Result Date: 01/05/2019 CLINICAL DATA:  Fall, weakness, left elbow abrasion EXAM: LEFT ELBOW - COMPLETE 3+ VIEW COMPARISON:  None. FINDINGS: Small nondisplaced fracture of the coronoid process. Small left elbow joint effusion is present. There is circumferential soft tissue swelling of the elbow. No additional fracture or traumatic malalignment. Vascular calcium is seen in the soft tissues. IMPRESSION: Small nondisplaced fracture of the coronoid process. Electronically Signed   By: Lovena Le M.D.   On: 01/05/2019 14:37   Ct Head Wo Contrast  Result Date: 01/05/2019 CLINICAL DATA:  Syncope EXAM: CT HEAD WITHOUT CONTRAST TECHNIQUE: Contiguous axial images were  obtained from the base of the skull through the vertex without intravenous contrast. COMPARISON:  CT head 08/20/2018 FINDINGS: Brain: Mild atrophy. Negative for acute infarct, hemorrhage, mass. No midline shift. Mild hypodensity in the periventricular white matter left frontal lobe. 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 Mild mucosal edema paranasal sinuses. Other: None IMPRESSION: No acute intracranial abnormality and no change from  the prior study. Electronically Signed   By: Franchot Gallo M.D.   On: 01/05/2019 19:13   Dg Knee Complete 4 Views Left  Result Date: 01/05/2019 CLINICAL DATA:  Weakness, pain, fall. Left knee pain. EXAM: LEFT KNEE - COMPLETE 4+ VIEW COMPARISON:  None. FINDINGS: Severe tricompartmental osteoarthrosis most pronounced in the medial femorotibial compartment where there is bone-on-bone contact though evaluation of the alignment is limited on nonweightbearing views. No acute fracture or traumatic malalignment. The lateral radiograph is suboptimally projected limiting evaluation for small effusion. No large effusion is seen. Minimal soft tissue thickening anterior to the knee. Vascular calcium seen posteriorly. IMPRESSION: Severe tricompartmental osteoarthrosis. No definite acute osseous abnormality. Electronically Signed   By: Lovena Le M.D.   On: 01/05/2019 14:35   Ct Renal Stone Study  Result Date: 01/05/2019 CLINICAL DATA:  Hematuria. EXAM: CT ABDOMEN AND PELVIS WITHOUT CONTRAST TECHNIQUE: Multidetector CT imaging of the abdomen and pelvis was performed following the standard protocol without IV contrast. COMPARISON:  None. FINDINGS: Lower chest: No acute abnormality. Coronary artery calcification. Heart size is normal. No pericardial effusion. Lung bases are clear. Hepatobiliary: There is a vague low-density lesion in the inferior aspect of the right lobe of the liver measuring approximately 2 cm in diameter. This correlates with the hyperechoic lesion on ultrasound and probably represents a benign hemangioma. Liver parenchyma is otherwise normal. Biliary tree is normal. Pancreas: Normal. Spleen: Normal. Adrenals/Urinary Tract: Adrenal glands are normal. 2.6 cm exophytic cyst on the lateral aspect of the mid left kidney. 3.2 cm exophytic cyst on the lower pole the right kidney. Slight prominence of the renal collecting systems bilaterally including dilatation of the ureters in the pelvis. The bladder is  markedly distended with a huge mass or clot within the bladder. The mass measures 16 cm in diameter. Foley catheter is in place at the bladder base. Stomach/Bowel: The bowel is normal. Appendix has been removed. Vascular/Lymphatic: Aortic atherosclerosis. No enlarged abdominal or pelvic lymph nodes. Reproductive: Prostate is unremarkable. Other: No abdominal wall hernia or abnormality. No abdominopelvic ascites. Musculoskeletal: The entire body of the T11 vertebra is sclerotic. There is also sclerosis of the anterior aspect of T10. There are several tiny sclerotic lesions in the pelvic bones. IMPRESSION: 1. Massively distended bladder with a huge mass or clot within the bladder. 2. Sclerotic lesions in the T11 and T10 vertebral bodies consistent with metastatic disease. Tiny sclerotic lesions in the pelvic bones may also represent metastatic disease. This is most commonly associated with prostate cancer. 3. Slight prominence of the renal collecting systems bilaterally including the ureters in the pelvis this is felt to be due to compression of the ureters by the markedly distended urinary bladder. Aortic Atherosclerosis (ICD10-I70.0). Electronically Signed   By: Lorriane Shire M.D.   On: 01/05/2019 19:22   Medications: .  ceFAZolin (ANCEF) IV     . sodium chloride   Intravenous Once  . amitriptyline  100 mg Oral QHS  . atorvastatin  40 mg Oral Daily  . calcitRIOL  1 mcg Oral Q T,Th,Sa-HD  .  calcium carbonate  800 mg of elemental calcium Oral Daily  . Chlorhexidine Gluconate Cloth  6 each Topical Q0600  . darbepoetin (ARANESP) injection - DIALYSIS  100 mcg Intravenous Q Fri-HD  . [START ON 01/15/2019] darbepoetin (ARANESP) injection - DIALYSIS  100 mcg Intravenous Q Sat-HD  . DULoxetine  30 mg Oral Daily  . ferric citrate  420 mg Oral TID WC  . hydrALAZINE  50 mg Oral TID  . insulin aspart  0-5 Units Subcutaneous QHS  . insulin aspart  0-9 Units Subcutaneous TID WC  . insulin glargine  20 Units  Subcutaneous Q2200  . isosorbide mononitrate  60 mg Oral Daily  . levothyroxine  50 mcg Oral Q0600  . sodium chloride flush  3 mL Intravenous Q12H     Dialysis Orders: Center: [facility]  on TTS. Time:4h 84min; 180NRe; BFR 400/DFR 700; EDW 136kg; 2K/3Ca; heparin 4450 unit bolus Mircera 100 mcg IV q 2 weeks- last 12/14/2018 Calcitriol 71mcg PO TIW Venofer 100mg  IV q HD(last dose 8/4) Tums 400mg  2QAC BID, 1 c snack, 4 QHS Auryxia 210mg  2tabs AC   Assessment/Plan: 1.  Hematuria/Anemia: Urology following. CT showed massively dilated bladder with huge mass or clot within the bladder, sclerotic lesions in the vertebral bodies consistent with metastatic disease. FOBT negative, hematuria as above. Hemoglobin 7.6 this AM S/P 2 units PRBC . Give Aranesp 200 mcg IV today. Follow HGB. 2. Hydronephrosis: CT revealed slight prominence of renal collecting systems felt to be due to compression of the ureters by bladder distention. 3.  ESRD:  TTS- HD today off schedule D/T scheduling.  K+ 4.3 No heparin with dialysis. DC linear sodium. HD 01/08/19 to get back on TTS schedule. Morbidly obese, Needs at least 4 hour treatment.  4.  Hypertension/volume: Does not appear volume overloaded on exam or CXR. Pre wt 133.7 Attempting UFG 2.5. Lower EDW on DC.  5.  Metabolic bone disease: Calcium 7.3, corrected 8.2. Was running in the 6's at outpatient unit and was taking Tums 4 QHS per most recent note- will order here and continue high Ca bath. Continue auryxia. RFP with tomorrows labs.  6.  Nutrition:  Renal diet with fluid restrictions  Rita H. Brown NP-C 01/07/2019, 10:48 AM  [facility] [phone]  Pt seen, examined and agree w A/P as above.  Kelly Splinter  MD 01/07/2019, 2:38 PM

## 2019-01-07 NOTE — Op Note (Signed)
Preoperative diagnosis: 1. Hematuria 2. Urinary retention  Postoperative diagnosis:  1. Same  Procedure:  1. Cystoscopy 2. Clot evacuation 3. Foley catheter placement  Surgeon: Dr. Matilde Sprang, Dr. Anthony Sar   Anesthesia: General  Complications: None  Intraoperative findings:  1. Normal urethroscopy. Long distance from meatus to bladder neck requiring hubbing of cystoscope to get into bladder 2. High riding bladder neck with enlarged prostate 3. Capacious bladder with approximately 1.5 L of old clot.  Completley evacuated and able to visualize normal bladder mucosa.  4. No specific source of bleeding was found and no fulguration was performed. 5. 24 French three-way hematuria catheter with 30 cc in the balloon 6. Unable to complete TRUS (no equipment available)   EBL: Unable to determine, blood removed seemed old/not active   Specimens: 1. None  Disposition of specimens: Pathology  Indication: Richard Hall is a patient who was found to have a a large clot burden and distended bladder on CT.  Failed bedside irrigation and decision was made to bring the patient to the operating room for clot evacuation.  Risk and benefits of the procedure were discussed and had like to proceed.  Informed consent was obtained.   Description of procedure:  The patient was taken to the operating room and general anesthesia was induced.  The patient was placed in the dorsal lithotomy position, prepped and draped in the usual sterile fashion, and preoperative antibiotics were administered. A preoperative time-out was performed.   Cystourethroscopy was performed.  The patient's urethra was examined and was normal/ demonstrated bilobar prostatic hypertrophy with a high riding bladder neck.  Distance between meatus and bladder neck was longer than usual and required cystoscope to be hubbed in order to enter the bladder.  Upon entry into the bladder there was a large amount of old coagulated clot.  42  French cystoscope was switched for 26 [place] resectoscope with visual obturator.  After entering the bladder under direct visualization a [place] syringe was used to irrigate and evacuate old clot.  Approximately 1.5 liters of old clot was removed along with a significant amount of prior irrigation/blood that was stuck inside the bladder.  After evacuating all the clots we able to clearly visualize the bladder mucosa.  This demonstrated no discrete source of bleeding.  A 24 French hematuria catheter was placed with 30 cc of sterile water within the balloon.  Catheter was placed on traction and CBI was initiated.  Patient was awoken from anesthesia and taken to the recovery area.  Plan: -Intraoperative hemoglobin 6.2.  Will receive 1 unit of PRBC prior to returning to [name] on catheter traction overnight -Keep patient on CBI overnight, can titrate down in flow rate to light pink in the tubing -Urologic follow-up:  Clinic visit to discuss hematuria, foley management, and prostate biopsy -Call patient's family, no answer, left voicemail -Plan communicated with hospitalist -Plan communicated with [facility] revascularization back to [facility] after completion of transfusion -Urology will continue to follow along.

## 2019-01-07 NOTE — Anesthesia Preprocedure Evaluation (Addendum)
Anesthesia Evaluation  Patient identified by MRN, date of birth, ID band Patient awake    Reviewed: Allergy & Precautions, NPO status , Patient's Chart, lab work & pertinent test results  Airway Mallampati: III  TM Distance: >3 FB Neck ROM: Full    Dental  (+) Dental Advisory Given, Upper Dentures, Partial Lower   Pulmonary sleep apnea and Continuous Positive Airway Pressure Ventilation , former smoker,    Pulmonary exam normal breath sounds clear to auscultation       Cardiovascular hypertension, Pt. on medications and Pt. 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  Reproductive/Obstetrics                          Anesthesia Physical Anesthesia Plan  ASA: IV  Anesthesia Plan: General   Post-op Pain Management:    Induction: Intravenous  PONV Risk Score and Plan: 3 and Ondansetron, Dexamethasone and Treatment may vary due to age or medical condition  Airway Management Planned: Oral ETT  Additional Equipment:   Intra-op Plan:   Post-operative Plan: Extubation in OR  Informed Consent: I have reviewed the patients History and Physical, chart, labs and discussed the procedure including the risks, benefits and alternatives for the proposed anesthesia with the patient or authorized representative who has indicated his/her  understanding and acceptance.     Dental advisory given  Plan Discussed with: CRNA  Anesthesia Plan Comments:         Anesthesia Quick Evaluation

## 2019-01-07 NOTE — Progress Notes (Signed)
Urology Progress Note   63 y.o. male with ESRD on dialysis who developed gross hematuria after foley placement.  Irrigated with copious clot removed through 88fr foley on 8/6. CT shows significant clot burden after irrigation  Interval 8/7: Foley stopped draining overnight. Nursing able to irrigate some clots out but patient having significant bladder spasms and leaking around foley.  Urology exchanged foley for 2fr hematuria catheter, still unable to flush and get clot out. Clots are too big to come through foley.   Objective: Vital signs in last 24 hours: Temp:  [98 F (36.7 C)-98.7 F (37.1 C)] 98 F (36.7 C) (08/07 0453) Pulse Rate:  [73-103] 73 (08/07 0453) Resp:  [18-22] 18 (08/07 0453) BP: (121-154)/(69-82) 154/82 (08/07 0453) SpO2:  [96 %-100 %] 96 % (08/07 0453) Weight:  [133.7 kg] 133.7 kg (08/07 0453)  Intake/Output from previous day: 08/06 0701 - 08/07 0700 In: 4550 [P.O.:940; I.V.:305; Blood:305] Out: 46503 [Urine:13825] Intake/Output this shift: Total I/O In: 360 [P.O.:360] Out: 10900 [Urine:10900]  Physical Exam:  General: Obese male, uncomfortable but not in distress GU: 24 fr foley, 20cc in balloon, CBI inflow stopped, catheter not draining.   Lab Results: Recent Labs    01/05/19 1340 01/06/19 0639 01/07/19 0327  HGB 6.8* 6.7* 7.6*  HCT 22.3* 20.9* 23.6*   BMET Recent Labs    01/06/19 0639 01/07/19 0327  NA 134* 131*  K 4.3 5.2*  CL 99 96*  CO2 20* 18*  GLUCOSE 108* 182*  BUN 53* 72*  CREATININE 8.80* 10.38*  CALCIUM 7.2* 7.3*     Studies/Results: Dg Chest 2 View  Result Date: 01/05/2019 CLINICAL DATA:  Weakness, pain, fall EXAM: CHEST - 2 VIEW COMPARISON:  Chest radiograph 08/30/2018, chest CT 09/23/2017 FINDINGS: Accounting for body habitus, the lungs are clear. No consolidation, features of edema, pneumothorax, or effusion. Pulmonary vascularity is normally distributed. The cardiomediastinal contours are stable from prior studies. No  acute osseous or soft tissue abnormality. Degenerative changes are present in the and imaged spine and shoulders. IMPRESSION: No active cardiopulmonary disease or acute traumatic abnormality. Electronically Signed   By: Lovena Le M.D.   On: 01/05/2019 14:33   Dg Elbow Complete Left  Result Date: 01/05/2019 CLINICAL DATA:  Fall, weakness, left elbow abrasion EXAM: LEFT ELBOW - COMPLETE 3+ VIEW COMPARISON:  None. FINDINGS: Small nondisplaced fracture of the coronoid process. Small left elbow joint effusion is present. There is circumferential soft tissue swelling of the elbow. No additional fracture or traumatic malalignment. Vascular calcium is seen in the soft tissues. IMPRESSION: Small nondisplaced fracture of the coronoid process. Electronically Signed   By: Lovena Le M.D.   On: 01/05/2019 14:37   Ct Head Wo Contrast  Result Date: 01/05/2019 CLINICAL DATA:  Syncope EXAM: CT HEAD WITHOUT CONTRAST TECHNIQUE: Contiguous axial images were obtained from the base of the skull through the vertex without intravenous contrast. COMPARISON:  CT head 08/20/2018 FINDINGS: Brain: Mild atrophy. Negative for acute infarct, hemorrhage, mass. No midline shift. Mild hypodensity in the periventricular white matter left frontal lobe. 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 Mild mucosal edema paranasal sinuses. Other: None IMPRESSION: No acute intracranial abnormality and no change from the prior study. Electronically Signed   By: Franchot Gallo M.D.   On: 01/05/2019 19:13   Dg Knee Complete 4 Views Left  Result Date: 01/05/2019 CLINICAL DATA:  Weakness, pain, fall. Left knee pain. EXAM: LEFT KNEE - COMPLETE 4+ VIEW  COMPARISON:  None. FINDINGS: Severe tricompartmental osteoarthrosis most pronounced in the medial femorotibial compartment where there is bone-on-bone contact though evaluation of the alignment is limited on nonweightbearing  views. No acute fracture or traumatic malalignment. The lateral radiograph is suboptimally projected limiting evaluation for small effusion. No large effusion is seen. Minimal soft tissue thickening anterior to the knee. Vascular calcium seen posteriorly. IMPRESSION: Severe tricompartmental osteoarthrosis. No definite acute osseous abnormality. Electronically Signed   By: Lovena Le M.D.   On: 01/05/2019 14:35   Ct Renal Stone Study  Result Date: 01/05/2019 CLINICAL DATA:  Hematuria. EXAM: CT ABDOMEN AND PELVIS WITHOUT CONTRAST TECHNIQUE: Multidetector CT imaging of the abdomen and pelvis was performed following the standard protocol without IV contrast. COMPARISON:  None. FINDINGS: Lower chest: No acute abnormality. Coronary artery calcification. Heart size is normal. No pericardial effusion. Lung bases are clear. Hepatobiliary: There is a vague low-density lesion in the inferior aspect of the right lobe of the liver measuring approximately 2 cm in diameter. This correlates with the hyperechoic lesion on ultrasound and probably represents a benign hemangioma. Liver parenchyma is otherwise normal. Biliary tree is normal. Pancreas: Normal. Spleen: Normal. Adrenals/Urinary Tract: Adrenal glands are normal. 2.6 cm exophytic cyst on the lateral aspect of the mid left kidney. 3.2 cm exophytic cyst on the lower pole the right kidney. Slight prominence of the renal collecting systems bilaterally including dilatation of the ureters in the pelvis. The bladder is markedly distended with a huge mass or clot within the bladder. The mass measures 16 cm in diameter. Foley catheter is in place at the bladder base. Stomach/Bowel: The bowel is normal. Appendix has been removed. Vascular/Lymphatic: Aortic atherosclerosis. No enlarged abdominal or pelvic lymph nodes. Reproductive: Prostate is unremarkable. Other: No abdominal wall hernia or abnormality. No abdominopelvic ascites. Musculoskeletal: The entire body of the T11  vertebra is sclerotic. There is also sclerosis of the anterior aspect of T10. There are several tiny sclerotic lesions in the pelvic bones. IMPRESSION: 1. Massively distended bladder with a huge mass or clot within the bladder. 2. Sclerotic lesions in the T11 and T10 vertebral bodies consistent with metastatic disease. Tiny sclerotic lesions in the pelvic bones may also represent metastatic disease. This is most commonly associated with prostate cancer. 3. Slight prominence of the renal collecting systems bilaterally including the ureters in the pelvis this is felt to be due to compression of the ureters by the markedly distended urinary bladder. Aortic Atherosclerosis (ICD10-I70.0). Electronically Signed   By: Lorriane Shire M.D.   On: 01/05/2019 19:22   Assessment/Plan:  63 y.o. male with large clot burden in bladder despite maximum size hematuria catheter.   - Keep NPO - Tentative plan for clot evacuation in the OR this morning. We will work on logisitics      LOS: 1 day   Tharon Aquas 01/07/2019, 6:58 AM

## 2019-01-08 ENCOUNTER — Encounter (HOSPITAL_COMMUNITY): Payer: Self-pay | Admitting: Urology

## 2019-01-08 LAB — TYPE AND SCREEN
ABO/RH(D): A POS
ABO/RH(D): A POS
Antibody Screen: NEGATIVE
Antibody Screen: NEGATIVE
Unit division: 0
Unit division: 0
Unit division: 0
Unit division: 0
Unit division: 0
Unit division: 0
Unit division: 0

## 2019-01-08 LAB — BPAM RBC
Blood Product Expiration Date: 202008242359
Blood Product Expiration Date: 202008242359
Blood Product Expiration Date: 202008272359
Blood Product Expiration Date: 202008282359
Blood Product Expiration Date: 202009042359
Blood Product Expiration Date: 202009042359
Blood Product Expiration Date: 202008272359
ISSUE DATE / TIME: 202008051447
ISSUE DATE / TIME: 202008051943
ISSUE DATE / TIME: 202008061454
ISSUE DATE / TIME: 202008062214
ISSUE DATE / TIME: 202008081554
ISSUE DATE / TIME: 202008081554
ISSUE DATE / TIME: 202008071743
Unit Type and Rh: 6200
Unit Type and Rh: 6200
Unit Type and Rh: 6200
Unit Type and Rh: 6200
Unit Type and Rh: 6200
Unit Type and Rh: 6200
Unit Type and Rh: 6200

## 2019-01-08 LAB — RENAL FUNCTION PANEL
Albumin: 2.5 g/dL — ABNORMAL LOW (ref 3.5–5.0)
Anion gap: 16 — ABNORMAL HIGH (ref 5–15)
BUN: 68 mg/dL — ABNORMAL HIGH (ref 8–23)
CO2: 20 mmol/L — ABNORMAL LOW (ref 22–32)
Calcium: 6.6 mg/dL — ABNORMAL LOW (ref 8.9–10.3)
Chloride: 95 mmol/L — ABNORMAL LOW (ref 98–111)
Creatinine, Ser: 9.1 mg/dL — ABNORMAL HIGH (ref 0.61–1.24)
GFR calc Af Amer: 6 mL/min — ABNORMAL LOW (ref >60)
GFR calc non Af Amer: 6 mL/min — ABNORMAL LOW (ref >60)
Glucose, Bld: 233 mg/dL — ABNORMAL HIGH (ref 70–99)
Phosphorus: 9.8 mg/dL — ABNORMAL HIGH (ref 2.5–4.6)
Potassium: 5.3 mmol/L — ABNORMAL HIGH (ref 3.5–5.1)
Sodium: 131 mmol/L — ABNORMAL LOW (ref 135–145)

## 2019-01-08 LAB — ABO/RH: ABO/RH(D): A POS

## 2019-01-08 LAB — CBC
HCT: 18.9 % — ABNORMAL LOW (ref 39.0–52.0)
Hemoglobin: 6 g/dL — CL (ref 13.0–17.0)
MCH: 30.9 pg (ref 26.0–34.0)
MCHC: 31.7 g/dL (ref 30.0–36.0)
MCV: 97.4 fL (ref 80.0–100.0)
Platelets: 215 10*3/uL (ref 150–400)
RBC: 1.94 MIL/uL — ABNORMAL LOW (ref 4.22–5.81)
RDW: 17.2 % — ABNORMAL HIGH (ref 11.5–15.5)
WBC: 15.4 10*3/uL — ABNORMAL HIGH (ref 4.0–10.5)
nRBC: 1.5 % — ABNORMAL HIGH (ref 0.0–0.2)

## 2019-01-08 LAB — GLUCOSE, CAPILLARY
Glucose-Capillary: 112 mg/dL — ABNORMAL HIGH (ref 70–99)
Glucose-Capillary: 177 mg/dL — ABNORMAL HIGH (ref 70–99)
Glucose-Capillary: 151 mg/dL — ABNORMAL HIGH (ref 70–99)
Glucose-Capillary: 246 mg/dL — ABNORMAL HIGH (ref 70–99)

## 2019-01-08 LAB — PREPARE RBC (CROSSMATCH)

## 2019-01-08 MED ORDER — LIDOCAINE HCL (PF) 1 % IJ SOLN: 5.0000 mL | INTRAMUSCULAR | Status: DC | PRN

## 2019-01-08 MED ORDER — HYDROCORTISONE (PERIANAL) 2.5 % EX CREA
TOPICAL_CREAM | Freq: Two times a day (BID) | CUTANEOUS | Status: DC
  Administered 2019-01-08 – 2019-01-11 (×6): via TOPICAL
  Filled 2019-01-08: qty 28.35

## 2019-01-08 MED ORDER — PENTAFLUOROPROP-TETRAFLUOROETH EX AERO: 1.0000 "application " | INHALATION_SPRAY | CUTANEOUS | Status: DC | PRN

## 2019-01-08 MED ORDER — SODIUM CHLORIDE 0.9 % IV SOLN: 100.0000 mL | INTRAVENOUS | Status: DC | PRN

## 2019-01-08 MED ORDER — SODIUM CHLORIDE 0.9% IV SOLUTION: Freq: Once | INTRAVENOUS | Status: DC

## 2019-01-08 MED ORDER — LIDOCAINE-PRILOCAINE 2.5-2.5 % EX CREA: 1.0000 "application " | TOPICAL_CREAM | CUTANEOUS | Status: DC | PRN

## 2019-01-08 NOTE — Progress Notes (Addendum)
[facility] Progress Note   Subjective:   Patient seen in room. Underwent cystoscopy, clot evacuation and subsequent replacement of Foley catheter. Still blood in catheter. Denies SOB, dyspnea, CP, palpitations, abdominal pain, N/V/D.   Objective Vitals:   01/07/19 2351 01/08/19 0212 01/08/19 0343 01/08/19 0903  BP:   132/69 137/72  Pulse: (!) 111 80 89 88  Resp:   16 18  Temp:   98.1 F (36.7 C) 98.2 F (36.8 C)  TempSrc:   Oral Oral  SpO2:   99% 98%  Weight:   132.2 kg   Height:       Physical Exam General: Well developed, obese male in NAD Heart: RRR, no murmurs, rubs or gallops Lungs: CTA bilaterally without wheezing, rhonchi or rales Abdomen: Soft, non-distended, non-tender, + BS Extremities: No peripheral edema Dialysis Access: LUE AVF + thrill  Additional Objective Labs: Basic Metabolic Panel: Recent Labs  Lab 01/05/19 1340 01/06/19 0639 01/07/19 0327 01/07/19 1423  NA 133* 134* 131* 134*  K 4.9 4.3 5.2* 4.6  CL 98 99 96*  --   CO2 18* 20* 18*  --   GLUCOSE 255* 108* 182*  --   BUN 43* 53* 72*  --   CREATININE 6.98* 8.80* 10.38*  --   CALCIUM 8.0* 7.2* 7.3*  --    Liver Function Tests: Recent Labs  Lab 01/05/19 1340  AST 25  ALT <5  ALKPHOS 114  BILITOT 1.1  PROT 5.9*  ALBUMIN 2.7*   No results for input(s): LIPASE, AMYLASE in the last 168 hours. CBC: Recent Labs  Lab 01/05/19 1340 01/06/19 0639 01/07/19 0327 01/07/19 1423  WBC 16.6* 13.7* 16.5*  --   NEUTROABS 14.4*  --  13.8*  --   HGB 6.8* 6.7* 7.6* 8.2*  HCT 22.3* 20.9* 23.6* 24.0*  MCV 101.8* 97.7 96.3  --   PLT 223 202 213  --    Blood Culture    Component Value Date/Time   SDES URINE, RANDOM 01/06/2019 1134   SPECREQUEST NONE 01/06/2019 1134   CULT  01/06/2019 1134    NO GROWTH Performed at [facility], [place] [address], [place], [place] [address]    REPTSTATUS 01/07/2019 FINAL 01/06/2019 1134    Cardiac Enzymes: No results for input(s): CKTOTAL,  CKMB, CKMBINDEX, TROPONINI in the last 168 hours. CBG: Recent Labs  Lab 01/07/19 1307 01/07/19 1439 01/07/19 1655 01/07/19 2047 01/08/19 0658  GLUCAP 133* 140* 152* 146* 112*   Iron Studies: No results for input(s): IRON, TIBC, TRANSFERRIN, FERRITIN in the last 72 hours. @lablastinr3 @ Studies/Results: No results found. Medications: . sodium chloride 10 mL/hr at 01/07/19 1321   . sodium chloride   Intravenous Once  . sodium chloride   Intravenous Once  . acetaminophen  650 mg Oral Once  . amitriptyline  100 mg Oral QHS  . atorvastatin  40 mg Oral Daily  . calcitRIOL  1 mcg Oral Q T,Th,Sa-HD  . calcium carbonate  800 mg of elemental calcium Oral Daily  . Chlorhexidine Gluconate Cloth  6 each Topical Q0600  . [START ON 01/15/2019] darbepoetin (ARANESP) injection - DIALYSIS  200 mcg Intravenous Q Sat-HD  . DULoxetine  30 mg Oral Daily  . ferric citrate  420 mg Oral TID WC  . hydrALAZINE  50 mg Oral TID  . insulin aspart  0-5 Units Subcutaneous QHS  . insulin aspart  0-9 Units Subcutaneous TID WC  . insulin glargine  20 Units Subcutaneous Q2200  . isosorbide  mononitrate  60 mg Oral Daily  . levothyroxine  50 mcg Oral Q0600  . sodium chloride flush  3 mL Intravenous Q12H    Dialysis Orders: [facility] Time:4h 55min; 180NRe; BFR 400/DFR 700; EDW 136kg; 2K/3Ca; heparin 4450 unit bolus Mircera 100 mcg IV q 2 weeks- last 12/14/2018 Calcitriol 27mcg PO TIW Venofer 100mg  IV q HD(last dose 8/4) Tums 400mg  2QAC BID, 1 c snack, 4 QHS Auryxia 210mg  2tabs AC   Assessment/Plan: 1. Hematuria/Anemia:Urology following. CT showed massively dilated bladder with huge mass or clot within the bladder, sclerotic lesions in the vertebral bodies consistent with metastatic disease. FOBT negative, hematuria as above. Hemoglobin 8.2 this AM S/P 3 units PRBC . Given Aranesp 200 mcg IV yesterday. Follow HGB. 2. Hydronephrosis:CT revealed slight prominence of renal  collecting systems felt to be due to compression of the ureters by bladder distention. 3. ESRD:TTS- HD today off schedule D/T scheduling.  K+ 4.6 No heparin with dialysis.DC linear sodium. HD 01/08/19 to get back on TTS schedule. Morbidly obese, Needs at least 4 hour treatment.  4. Hypertension/volume:Does not appear volume overloaded on exam or CXR. BP slightly elevated today. HD today to get back on schedule, UFG 1-2L. Will need lower EDW at discharge.  5. Metabolic bone disease:Calcium 7.3, corrected 8.2. Was running in the 6's at outpatient unit and was taking Tums 4 QHS per most recent note- will order here and continue high Ca bath. Continue auryxia. 6. Nutrition:Renal diet with fluid restrictions  Anice Paganini, PA-C 01/08/2019, 10:45 AM  [facility] Pager: [phone]  Pt seen, examined and agree w A/P as above.  Kelly Splinter  MD 01/08/2019, 1:44 PM

## 2019-01-08 NOTE — Progress Notes (Addendum)
Urology Progress Note   63 y.o. male with ESRD on dialysis who developed gross hematuria after foley placement.  Due to extensive clot burden taken for cystoscopy with clot EVAC under anesthesia on 8/7.  Intraoperatively 1.5 L of clot removed but no source of bleeding was found.  24 French three-way catheter was placed.    Interval 8/8: Received 1 unit PRBC postop.  Catheter need to be irrigated by nursing upon arrival back to [facility].  Remained bloody overnight with fast drip CBI.  Hemoglobin this morning 6.0, urine dark red on moderate rate CBI  Objective: Vital signs in last 24 hours: Temp:  [97.8 F (36.6 C)-99 F (37.2 C)] 98.2 F (36.8 C) (08/08 0903) Pulse Rate:  [75-132] 88 (08/08 0903) Resp:  [16-25] 18 (08/08 0903) BP: (119-171)/(53-93) 137/72 (08/08 0903) SpO2:  [91 %-100 %] 98 % (08/08 0903) Weight:  [132.2 kg] 132.2 kg (08/08 0343)  Intake/Output from previous day: 08/07 0701 - 08/08 0700 In: 4541.7 [I.V.:341.7; Blood:1000; IV Piggyback:200] Out: 62831 [Urine:33350] Intake/Output this shift: Total I/O In: 3240 [P.O.:240; Other:3000] Out: 15000 [Urine:15000]  Physical Exam:  General: Obese male, uncomfortable but not in distress GU: 24 fr foley, 30cc in balloon, CBI moderate rate, dark red urine  Lab Results: Recent Labs    01/06/19 0639 01/07/19 0327 01/07/19 1423  HGB 6.7* 7.6* 8.2*  HCT 20.9* 23.6* 24.0*   BMET Recent Labs    01/06/19 0639 01/07/19 0327 01/07/19 1423  NA 134* 131* 134*  K 4.3 5.2* 4.6  CL 99 96*  --   CO2 20* 18*  --   GLUCOSE 108* 182*  --   BUN 53* 72*  --   CREATININE 8.80* 10.38*  --   CALCIUM 7.2* 7.3*  --      Studies/Results: No results found. Assessment/Plan:  63 y.o. male with large clot burden in bladder despite maximum size hematuria catheter.   -NPO after midnight in case further intervention needed - Consider transfusion for hemoglobin less than 7 -Continue CBI, titrate to light pink. -Flush catheter  PRN if becomes obstructed -Urology will continue to follow along.   - Follow-up will be [facility] in [facility] for trial of void as well as discussion of elevated PSA and possible prostate biopsy given sclerotic lesions found on CT     LOS: 2 days   Tharon Aquas 01/08/2019, 1:32 PM

## 2019-01-08 NOTE — Progress Notes (Signed)
Patient in Hemodialysis.  Checked on his continuous bladder irrigation.  Only 525 in foley bag, minimal drainage from foley.  Flushed with 50 cc sterile water.  Minimal change in output from foley. Slightly increased CBI fluid rate.  After about 45 min 3L in Foley bag.  CBI rate slowed again.  RN to call if assistance needed.

## 2019-01-08 NOTE — Progress Notes (Signed)
PT Cancellation Note  Patient Details Name: Richard Hall MRN: [id number] DOB: Sep 04, 1955   Cancelled Treatment:    Reason Eval/Treat Not Completed: Patient at procedure or test/unavailable (HD). PT will continue to follow up with pt acutely as available.    [place] 01/08/2019, 3:36 PM

## 2019-01-08 NOTE — Progress Notes (Signed)
Patient refused use of CPAP for the evening. Will continue to monitor patient.  

## 2019-01-08 NOTE — Progress Notes (Signed)
PROGRESS NOTE  Richard Hall BUL:[id number] DOB: 1956/04/15 DOA: 01/05/2019 PCP: Richard Mc, MD  HPI/Recap of past 24 hours: HPI from Dr Richard Hall is a 63 y.o. male with medical history significant of constipation; HLD; depression; DM;afib (patient denies, not on [facility]); hypothyroidism; OSA on CPAP; HTN; and ESRD on TTS HD since January presenting with dizziness.  He went to [facility] yesterday for urinary retention.  They put a foley in and his urine has been increasingly bloody.  He became dizzy with standing and walking.  He got so dizzy while walking that he collapsed to the floor.  He was too weak to walk but did not lose consciousness.  No SOB.  No fever.  He has not noticed any blood in his stools.  The urine is "all blood", clotting the bag.  He pees daily and was peeing less than usual yesterday. Pt was seen yesterday in the [facility] ER. A prior bladder scan on 7/20 showed 4129 cc of urine in his bladder.  Renal US from 7/30 showed moderate R and mild L hydronephrosis with evidence of chronic medical renal disease. Pt had normal EGD and colonoscopy and video capsule endoscopy showed 1 AVM.  Last visit was in 12/19. In the ED, pt hypotensive with EMS, received 500 cc IVF.  Mentating well despite low BP.  Hgb 6.8, looks pale; prior Hgb 10.  Stool dark black, received 1 unit emergency release blood.  Denies h/o GI bleed.  No NSAIDs.  Urology consulted.  Patient admitted for further management.    Today, patient denies any new complaints.  Denies any significant abdominal pain, chest pain, shortness of breath, fever/chills.  Foley bag noted to still have significant hematuria.  Awaiting lab draw during hemodialysis   Assessment/Plan: Principal Problem:   Symptomatic anemia Active Problems:   OSA on CPAP   Hypothyroidism (acquired)   Essential hypertension   Dyslipidemia   Diabetes mellitus with end-stage renal disease (HCC)   Gross hematuria   Gastrointestinal hemorrhage with melena    Acute on chronic symptomatic blood loss anemia likely 2/2 gross hematuria s/p cystoscopy, clot evacuation on 01/07/2019 Foley still draining significant hematuria Baseline hemoglobin around 10-11 So far has received 5 units of P RBC, last on 01/07/2019 UA was turbid and significantly red, UC with no growth CT stone showed massively distended bladder with a huge mass or clot within the bladder, slight prominence of the renal collecting systems bilaterally including the ureters due to compression of the ureters by the markedly distended urinary bladder.  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 cancer Status post cystoscopy, clot evacuation on 01/07/2019 Monitor very closely Daily CBC  Leukocytosis Afebrile Chest x-ray unremarkable UC no growth Daily CBC  Near syncope Likely secondary to acute on chronic blood loss as mentioned above CT head unremarkable Monitor closely  Unlikely GI bleeding Had extensive GI work-up in May and September 2019 FOBT repeated by GI negative Dark stools likely due to iron supplementation GI consulted, no further work-up from the standpoint  ESRD on HD T/TH/S Nephrology on board  Diabetes mellitus type 2 A1c 5.9 SSI, Lantus, hypoglycemic protocol, Accu-Cheks  Hypertension Continue hydralazine, imdur  Hypothyroidism Continue Synthroid  Obesity Lifestyle modification advised        Malnutrition Type:      Malnutrition Characteristics:      Nutrition Interventions:       Estimated body mass index is 39.53 kg/m  as calculated from the following:   Height as of this encounter: 6' (1.829 m).   Weight as of this encounter: 132.2 kg.     Code Status: Full  Family Communication: None at bedside  Disposition Plan: TBD   Consultants:  Urology  Nephrology  GI  Procedures:  Cystoscopy, clot evacuation on 01/07/2019  Antimicrobials:  None  DVT  prophylaxis: SCDs   Objective: Vitals:   01/07/19 2351 01/08/19 0212 01/08/19 0343 01/08/19 0903  BP:   132/69 137/72  Pulse: (!) 111 80 89 88  Resp:   16 18  Temp:   98.1 F (36.7 C) 98.2 F (36.8 C)  TempSrc:   Oral Oral  SpO2:   99% 98%  Weight:   132.2 kg   Height:        Intake/Output Summary (Last 24 hours) at 01/08/2019 1223 Last data filed at 01/08/2019 1146 Gross per 24 hour  Intake 7781.67 ml  Output 46350 ml  Net -38568.33 ml   Filed Weights   01/07/19 0745 01/07/19 1115 01/08/19 0343  Weight: 133.7 kg 132.2 kg 132.2 kg    Exam:  General: NAD   Cardiovascular: S1, S2 present  Respiratory: CTAB  Abdomen: Soft, nontender, nondistended, bowel sounds present  Musculoskeletal: No bilateral pedal edema noted  Skin: Normal  Psychiatry: Normal mood    Data Reviewed: CBC: Recent Labs  Lab 01/05/19 1340 01/06/19 0639 01/07/19 0327 01/07/19 1423  WBC 16.6* 13.7* 16.5*  --   NEUTROABS 14.4*  --  13.8*  --   HGB 6.8* 6.7* 7.6* 8.2*  HCT 22.3* 20.9* 23.6* 24.0*  MCV 101.8* 97.7 96.3  --   PLT 223 202 213  --    Basic Metabolic Panel: Recent Labs  Lab 01/05/19 1340 01/06/19 0639 01/07/19 0327 01/07/19 1423  NA 133* 134* 131* 134*  K 4.9 4.3 5.2* 4.6  CL 98 99 96*  --   CO2 18* 20* 18*  --   GLUCOSE 255* 108* 182*  --   BUN 43* 53* 72*  --   CREATININE 6.98* 8.80* 10.38*  --   CALCIUM 8.0* 7.2* 7.3*  --    GFR: Estimated Creatinine Clearance: 10.2 mL/min (A) (by C-G formula based on SCr of 10.38 mg/dL (H)). Liver Function Tests: Recent Labs  Lab 01/05/19 1340  AST 25  ALT <5  ALKPHOS 114  BILITOT 1.1  PROT 5.9*  ALBUMIN 2.7*   No results for input(s): LIPASE, AMYLASE in the last 168 hours. No results for input(s): AMMONIA in the last 168 hours. Coagulation Profile: No results for input(s): INR, PROTIME in the last 168 hours. Cardiac Enzymes: No results for input(s): CKTOTAL, CKMB, CKMBINDEX, TROPONINI in the last 168 hours. BNP  (last 3 results) No results for input(s): PROBNP in the last 8760 hours. HbA1C: Recent Labs    01/06/19 0639  HGBA1C 5.9*   CBG: Recent Labs  Lab 01/07/19 1439 01/07/19 1655 01/07/19 2047 01/08/19 0658 01/08/19 1143  GLUCAP 140* 152* 146* 112* 177*   Lipid Profile: No results for input(s): CHOL, HDL, LDLCALC, TRIG, CHOLHDL, LDLDIRECT in the last 72 hours. Thyroid Function Tests: No results for input(s): TSH, T4TOTAL, FREET4, T3FREE, THYROIDAB in the last 72 hours. Anemia Panel: No results for input(s): VITAMINB12, FOLATE, FERRITIN, TIBC, IRON, RETICCTPCT in the last 72 hours. Urine analysis:    Component Value Date/Time   COLORURINE RED (A) 01/06/2019 1118   APPEARANCEUR TURBID (A) 01/06/2019 1118   LABSPEC  01/06/2019 1118  TEST NOT REPORTED DUE TO COLOR INTERFERENCE OF URINE PIGMENT   PHURINE  01/06/2019 1118    TEST NOT REPORTED DUE TO COLOR INTERFERENCE OF URINE PIGMENT   GLUCOSEU (A) 01/06/2019 1118    TEST NOT REPORTED DUE TO COLOR INTERFERENCE OF URINE PIGMENT   HGBUR (A) 01/06/2019 1118    TEST NOT REPORTED DUE TO COLOR INTERFERENCE OF URINE PIGMENT   BILIRUBINUR (A) 01/06/2019 1118    TEST NOT REPORTED DUE TO COLOR INTERFERENCE OF URINE PIGMENT   KETONESUR (A) 01/06/2019 1118    TEST NOT REPORTED DUE TO COLOR INTERFERENCE OF URINE PIGMENT   PROTEINUR (A) 01/06/2019 1118    TEST NOT REPORTED DUE TO COLOR INTERFERENCE OF URINE PIGMENT   NITRITE (A) 01/06/2019 1118    TEST NOT REPORTED DUE TO COLOR INTERFERENCE OF URINE PIGMENT   LEUKOCYTESUR (A) 01/06/2019 1118    TEST NOT REPORTED DUE TO COLOR INTERFERENCE OF URINE PIGMENT   Sepsis Labs: @LABRCNTIP (procalcitonin:4,lacticidven:4)  ) Recent Results (from the past 240 hour(s))  SARS Coronavirus 2 [facility] order, Performed in [facility] hospital lab) Nasopharyngeal Nasopharyngeal Swab     Status: None   Collection Time: 01/05/19  5:50 PM   Specimen: Nasopharyngeal Swab  Result Value Ref Range Status    SARS Coronavirus 2 NEGATIVE NEGATIVE Final    Comment: (NOTE) If result is NEGATIVE SARS-CoV-2 target nucleic acids are NOT DETECTED. The SARS-CoV-2 RNA is generally detectable in upper and lower  respiratory specimens during the acute phase of infection. The lowest  concentration of SARS-CoV-2 viral copies this assay can detect is 250  copies / mL. A negative result does not preclude SARS-CoV-2 infection  and should not be used as the sole basis for treatment or other  patient management decisions.  A negative result may occur with  improper specimen collection / handling, submission of specimen other  than nasopharyngeal swab, presence of viral mutation(s) within the  areas targeted by this assay, and inadequate number of viral copies  (<250 copies / mL). A negative result must be combined with clinical  observations, patient history, and epidemiological information. If result is POSITIVE SARS-CoV-2 target nucleic acids are DETECTED. The SARS-CoV-2 RNA is generally detectable in upper and lower  respiratory specimens dur ing the acute phase of infection.  Positive  results are indicative of active infection with SARS-CoV-2.  Clinical  correlation with patient history and other diagnostic information is  necessary to determine patient infection status.  Positive results do  not rule out bacterial infection or co-infection with other viruses. If result is PRESUMPTIVE POSTIVE SARS-CoV-2 nucleic acids MAY BE PRESENT.   A presumptive positive result was obtained on the submitted specimen  and confirmed on repeat testing.  While 2019 novel coronavirus  (SARS-CoV-2) nucleic acids may be present in the submitted sample  additional confirmatory testing may be necessary for epidemiological  and / or clinical management purposes  to differentiate between  SARS-CoV-2 and other Sarbecovirus currently known to infect humans.  If clinically indicated additional testing with an alternate test   methodology [phone]) is advised. The SARS-CoV-2 RNA is generally  detectable in upper and lower respiratory sp ecimens during the acute  phase of infection. The expected result is Negative. Fact Sheet for Patients:  StrictlyIdeas.no Fact Sheet for Healthcare Providers: BankingDealers.co.za This test is not yet approved or cleared by the [place] FDA and has been authorized for detection and/or diagnosis of SARS-CoV-2 by FDA under an Emergency Use Authorization (EUA).  This  EUA will remain in effect (meaning this test can be used) for the duration of the COVID-19 declaration under Section 564(b)(1) of the Act, 21 U.S.C. section 360bbb-3(b)(1), unless the authorization is terminated or revoked sooner. Performed at [facility], [place] [address] [place], [place] [address]   MRSA PCR Screening     Status: None   Collection Time: 01/05/19 10:48 PM   Specimen: Nasopharyngeal  Result Value Ref Range Status   MRSA by PCR NEGATIVE NEGATIVE Final    Comment:        The GeneXpert MRSA Assay (FDA approved for NASAL specimens only), is one component of a comprehensive MRSA colonization surveillance program. It is not intended to diagnose MRSA infection nor to guide or monitor treatment for MRSA infections. Performed at [facility], [place] [address] [place], [place] [address]   Urine culture     Status: None   Collection Time: 01/06/19 11:34 AM   Specimen: Urine, Random  Result Value Ref Range Status   Specimen Description URINE, RANDOM  Final   Special Requests NONE  Final   Culture   Final    NO GROWTH Performed at [facility], [place] [address] [place], [place] [address]    Report Status 01/07/2019 FINAL  Final      Studies: No results found.  Scheduled Meds: . sodium chloride   Intravenous Once  . sodium chloride   Intravenous Once  . acetaminophen  650 mg Oral Once  . amitriptyline  100 mg Oral QHS  .  atorvastatin  40 mg Oral Daily  . calcitRIOL  1 mcg Oral Q T,Th,Sa-HD  . calcium carbonate  800 mg of elemental calcium Oral Daily  . Chlorhexidine Gluconate Cloth  6 each Topical Q0600  . [START ON 01/15/2019] darbepoetin (ARANESP) injection - DIALYSIS  200 mcg Intravenous Q Sat-HD  . DULoxetine  30 mg Oral Daily  . ferric citrate  420 mg Oral TID WC  . hydrALAZINE  50 mg Oral TID  . insulin aspart  0-5 Units Subcutaneous QHS  . insulin aspart  0-9 Units Subcutaneous TID WC  . insulin glargine  20 Units Subcutaneous Q2200  . isosorbide mononitrate  60 mg Oral Daily  . levothyroxine  50 mcg Oral Q0600  . sodium chloride flush  3 mL Intravenous Q12H    Continuous Infusions: . sodium chloride    . sodium chloride    . sodium chloride 10 mL/hr at 01/07/19 1321     LOS: 2 days     Alma Friendly, MD [facility]  If 7PM-7AM, please contact night-coverage [email or web address] 01/08/2019, 12:23 PM

## 2019-01-09 ENCOUNTER — Inpatient Hospital Stay (HOSPITAL_COMMUNITY): Payer: Medicare Other

## 2019-01-09 LAB — CBC WITH DIFFERENTIAL/PLATELET
Abs Immature Granulocytes: 0.33 10*3/uL — ABNORMAL HIGH (ref 0.00–0.07)
Basophils Absolute: 0 10*3/uL (ref 0.0–0.1)
Basophils Relative: 0 %
Eosinophils Absolute: 0 10*3/uL (ref 0.0–0.5)
Eosinophils Relative: 0 %
HCT: 21.1 % — ABNORMAL LOW (ref 39.0–52.0)
Hemoglobin: 6.9 g/dL — CL (ref 13.0–17.0)
Immature Granulocytes: 3 %
Lymphocytes Relative: 9 %
Lymphs Abs: 1.2 10*3/uL (ref 0.7–4.0)
MCH: 32.1 pg (ref 26.0–34.0)
MCHC: 32.7 g/dL (ref 30.0–36.0)
MCV: 98.1 fL (ref 80.0–100.0)
Monocytes Absolute: 1.3 10*3/uL — ABNORMAL HIGH (ref 0.1–1.0)
Monocytes Relative: 10 %
Neutro Abs: 9.8 10*3/uL — ABNORMAL HIGH (ref 1.7–7.7)
Neutrophils Relative %: 78 %
Platelets: 230 10*3/uL (ref 150–400)
RBC: 2.15 MIL/uL — ABNORMAL LOW (ref 4.22–5.81)
RDW: 17 % — ABNORMAL HIGH (ref 11.5–15.5)
WBC: 12.6 10*3/uL — ABNORMAL HIGH (ref 4.0–10.5)
nRBC: 2.7 % — ABNORMAL HIGH (ref 0.0–0.2)

## 2019-01-09 LAB — GLUCOSE, CAPILLARY
Glucose-Capillary: 121 mg/dL — ABNORMAL HIGH (ref 70–99)
Glucose-Capillary: 95 mg/dL (ref 70–99)
Glucose-Capillary: 189 mg/dL — ABNORMAL HIGH (ref 70–99)
Glucose-Capillary: 153 mg/dL — ABNORMAL HIGH (ref 70–99)

## 2019-01-09 LAB — BASIC METABOLIC PANEL
Anion gap: 14 (ref 5–15)
BUN: 47 mg/dL — ABNORMAL HIGH (ref 8–23)
CO2: 24 mmol/L (ref 22–32)
Calcium: 7.7 mg/dL — ABNORMAL LOW (ref 8.9–10.3)
Chloride: 98 mmol/L (ref 98–111)
Creatinine, Ser: 6.68 mg/dL — ABNORMAL HIGH (ref 0.61–1.24)
GFR calc Af Amer: 9 mL/min — ABNORMAL LOW (ref >60)
GFR calc non Af Amer: 8 mL/min — ABNORMAL LOW (ref >60)
Glucose, Bld: 141 mg/dL — ABNORMAL HIGH (ref 70–99)
Potassium: 4.7 mmol/L (ref 3.5–5.1)
Sodium: 136 mmol/L (ref 135–145)

## 2019-01-09 LAB — HEMOGLOBIN AND HEMATOCRIT, BLOOD
HCT: 22.3 % — ABNORMAL LOW (ref 39.0–52.0)
Hemoglobin: 7.4 g/dL — ABNORMAL LOW (ref 13.0–17.0)

## 2019-01-09 LAB — PREPARE RBC (CROSSMATCH)

## 2019-01-09 MED ORDER — SODIUM CHLORIDE 0.9% IV SOLUTION
Freq: Once | INTRAVENOUS | Status: AC
  Administered 2019-01-09: 09:00:00 via INTRAVENOUS

## 2019-01-09 NOTE — Progress Notes (Signed)
PROGRESS NOTE  Richard Hall LDJ:[id number] DOB: Nov 14, 1955 DOA: 01/05/2019 PCP: [name], MD  HPI/Recap of past 24 hours: HPI from Dr [name] is a 63 y.o. male with medical history significant of constipation; HLD; depression; DM;afib (patient denies, not on [facility]); hypothyroidism; OSA on CPAP; HTN; and ESRD on TTS HD since January presenting with dizziness.  He went to [facility] yesterday for urinary retention.  They put a foley in and his urine has been increasingly bloody.  He became dizzy with standing and walking.  He got so dizzy while walking that he collapsed to the floor.  He was too weak to walk but did not lose consciousness.  No SOB.  No fever.  He has not noticed any blood in his stools.  The urine is "all blood", clotting the bag.  He pees daily and was peeing less than usual yesterday. Pt was seen yesterday in the [facility] ER. A prior bladder scan on 7/20 showed 4129 cc of urine in his bladder.  Renal US from 7/30 showed moderate R and mild L hydronephrosis with evidence of chronic medical renal disease. Pt had normal EGD and colonoscopy and video capsule endoscopy showed 1 AVM.  Last visit was in 12/19. In the ED, pt hypotensive with EMS, received 500 cc IVF.  Mentating well despite low BP.  Hgb 6.8, looks pale; prior Hgb 10.  Stool dark black, received 1 unit emergency release blood.  Denies h/o GI bleed.  No NSAIDs.  Urology consulted.  Patient admitted for further management.    Today, patient denies any new complaints, denies any chest pain, shortness of breath, abdominal pain, nausea/vomiting, fever/chills.   Assessment/Plan: Principal Problem:   Symptomatic anemia Active Problems:   OSA on CPAP   Hypothyroidism (acquired)   Essential hypertension   Dyslipidemia   Diabetes mellitus with end-stage renal disease (HCC)   Gross hematuria   Gastrointestinal hemorrhage with melena   Acute on chronic symptomatic blood loss anemia likely 2/2 gross hematuria s/p cystoscopy,  clot evacuation on 01/07/2019 Foley currently draining clear urine Baseline hemoglobin around 10-11 So far has received 5 units of PRBC, last on 01/07/2019, plan to transfuse another 2 units of PRBC on 01/09/2019 UA was turbid and significantly red, UC with no growth CT stone showed massively distended bladder with a huge mass or clot within the bladder, slight prominence of the renal collecting systems bilaterally including the ureters due to compression of the ureters by the markedly distended urinary bladder.  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 cancer Status post cystoscopy, clot evacuation on 01/07/2019 Repeat ultrasound renal pending to reassess hydronephrosis Monitor very closely, for signs of significant hematuria Daily CBC  Leukocytosis Afebrile Chest x-ray unremarkable UC no growth Daily CBC  Near syncope Likely secondary to acute on chronic blood loss as mentioned above CT head unremarkable Monitor closely  Unlikely GI bleeding Had extensive GI work-up in May and September 2019 FOBT repeated by GI negative Dark stools likely due to iron supplementation GI consulted, no further work-up from the standpoint  ESRD on HD T/TH/S Nephrology on board  Diabetes mellitus type 2 A1c 5.9 SSI, Lantus, hypoglycemic protocol, Accu-Cheks  Hypertension Continue hydralazine, imdur  Hypothyroidism Continue Synthroid  Obesity Lifestyle modification advised        Malnutrition Type:      Malnutrition Characteristics:      Nutrition Interventions:       Estimated body  mass index is 40.16 kg/m as calculated from the following:   Height as of this encounter: 6' (1.829 m).   Weight as of this encounter: 134.3 kg.     Code Status: Full  Family Communication: None at bedside  Disposition Plan: Likely home once work-up complete   Consultants:  Urology  Nephrology  GI   Procedures:  Cystoscopy, clot evacuation on 01/07/2019  Antimicrobials:  None  DVT prophylaxis: SCDs   Objective: Vitals:   01/09/19 0933 01/09/19 1230 01/09/19 1254 01/09/19 1305  BP: 136/69 136/89 136/89 140/75  Pulse: 85 88 88 90  Resp: 18 18 18 18   Temp: 98 F (36.7 C) 98 F (36.7 C) 98 F (36.7 C) 98.1 F (36.7 C)  TempSrc: Oral Oral Oral Oral  SpO2: 100% 96% 96% 100%  Weight:      Height:        Intake/Output Summary (Last 24 hours) at 01/09/2019 1540 Last data filed at 01/09/2019 1500 Gross per 24 hour  Intake 36872 ml  Output 44125 ml  Net -7253 ml   Filed Weights   01/08/19 1300 01/08/19 1715 01/08/19 2204  Weight: 136 kg 135.5 kg 134.3 kg    Exam:  General: NAD, obese  Cardiovascular: S1, S2 present  Respiratory: CTAB  Abdomen: Soft, nontender, nondistended, bowel sounds present  Musculoskeletal: No bilateral pedal edema noted  Skin: Normal  Psychiatry: Normal mood    Data Reviewed: CBC: Recent Labs  Lab 01/05/19 1340 01/06/19 0639 01/07/19 0327 01/07/19 1423 01/08/19 1325 01/09/19 0530  WBC 16.6* 13.7* 16.5*  --  15.4* 12.6*  NEUTROABS 14.4*  --  13.8*  --   --  9.8*  HGB 6.8* 6.7* 7.6* 8.2* 6.0* 6.9*  HCT 22.3* 20.9* 23.6* 24.0* 18.9* 21.1*  MCV 101.8* 97.7 96.3  --  97.4 98.1  PLT 223 202 213  --  215 127   Basic Metabolic Panel: Recent Labs  Lab 01/05/19 1340 01/06/19 0639 01/07/19 0327 01/07/19 1423 01/08/19 1325 01/09/19 0530  NA 133* 134* 131* 134* 131* 136  K 4.9 4.3 5.2* 4.6 5.3* 4.7  CL 98 99 96*  --  95* 98  CO2 18* 20* 18*  --  20* 24  GLUCOSE 255* 108* 182*  --  233* 141*  BUN 43* 53* 72*  --  68* 47*  CREATININE 6.98* 8.80* 10.38*  --  9.10* 6.68*  CALCIUM 8.0* 7.2* 7.3*  --  6.6* 7.7*  PHOS  --   --   --   --  9.8*  --    GFR: Estimated Creatinine Clearance: 16.1 mL/min (A) (by C-G formula based on SCr of 6.68 mg/dL (H)). Liver Function Tests: Recent Labs  Lab 01/05/19 1340 01/08/19 1325  AST 25   --   ALT <5  --   ALKPHOS 114  --   BILITOT 1.1  --   PROT 5.9*  --   ALBUMIN 2.7* 2.5*   No results for input(s): LIPASE, AMYLASE in the last 168 hours. No results for input(s): AMMONIA in the last 168 hours. Coagulation Profile: No results for input(s): INR, PROTIME in the last 168 hours. Cardiac Enzymes: No results for input(s): CKTOTAL, CKMB, CKMBINDEX, TROPONINI in the last 168 hours. BNP (last 3 results) No results for input(s): PROBNP in the last 8760 hours. HbA1C: No results for input(s): HGBA1C in the last 72 hours. CBG: Recent Labs  Lab 01/08/19 1143 01/08/19 1808 01/08/19 2122 01/09/19 0718 01/09/19 1114  GLUCAP 177* 151* 246* 121*  95   Lipid Profile: No results for input(s): CHOL, HDL, LDLCALC, TRIG, CHOLHDL, LDLDIRECT in the last 72 hours. Thyroid Function Tests: No results for input(s): TSH, T4TOTAL, FREET4, T3FREE, THYROIDAB in the last 72 hours. Anemia Panel: No results for input(s): VITAMINB12, FOLATE, FERRITIN, TIBC, IRON, RETICCTPCT in the last 72 hours. Urine analysis:    Component Value Date/Time   COLORURINE RED (A) 01/06/2019 1118   APPEARANCEUR TURBID (A) 01/06/2019 1118   LABSPEC  01/06/2019 1118    TEST NOT REPORTED DUE TO COLOR INTERFERENCE OF URINE PIGMENT   PHURINE  01/06/2019 1118    TEST NOT REPORTED DUE TO COLOR INTERFERENCE OF URINE PIGMENT   GLUCOSEU (A) 01/06/2019 1118    TEST NOT REPORTED DUE TO COLOR INTERFERENCE OF URINE PIGMENT   HGBUR (A) 01/06/2019 1118    TEST NOT REPORTED DUE TO COLOR INTERFERENCE OF URINE PIGMENT   BILIRUBINUR (A) 01/06/2019 1118    TEST NOT REPORTED DUE TO COLOR INTERFERENCE OF URINE PIGMENT   KETONESUR (A) 01/06/2019 1118    TEST NOT REPORTED DUE TO COLOR INTERFERENCE OF URINE PIGMENT   PROTEINUR (A) 01/06/2019 1118    TEST NOT REPORTED DUE TO COLOR INTERFERENCE OF URINE PIGMENT   NITRITE (A) 01/06/2019 1118    TEST NOT REPORTED DUE TO COLOR INTERFERENCE OF URINE PIGMENT   LEUKOCYTESUR (A) 01/06/2019  1118    TEST NOT REPORTED DUE TO COLOR INTERFERENCE OF URINE PIGMENT   Sepsis Labs: @LABRCNTIP (procalcitonin:4,lacticidven:4)  ) Recent Results (from the past 240 hour(s))  SARS Coronavirus 2 Platea order, Performed in [facility] hospital lab) Nasopharyngeal Nasopharyngeal Swab     Status: None   Collection Time: 01/05/19  5:50 PM   Specimen: Nasopharyngeal Swab  Result Value Ref Range Status   SARS Coronavirus 2 NEGATIVE NEGATIVE Final    Comment: (NOTE) If result is NEGATIVE SARS-CoV-2 target nucleic acids are NOT DETECTED. The SARS-CoV-2 RNA is generally detectable in upper and lower  respiratory specimens during the acute phase of infection. The lowest  concentration of SARS-CoV-2 viral copies this assay can detect is 250  copies / mL. A negative result does not preclude SARS-CoV-2 infection  and should not be used as the sole basis for treatment or other  patient management decisions.  A negative result may occur with  improper specimen collection / handling, submission of specimen other  than nasopharyngeal swab, presence of viral mutation(s) within the  areas targeted by this assay, and inadequate number of viral copies  (<250 copies / mL). A negative result must be combined with clinical  observations, patient history, and epidemiological information. If result is POSITIVE SARS-CoV-2 target nucleic acids are DETECTED. The SARS-CoV-2 RNA is generally detectable in upper and lower  respiratory specimens dur ing the acute phase of infection.  Positive  results are indicative of active infection with SARS-CoV-2.  Clinical  correlation with patient history and other diagnostic information is  necessary to determine patient infection status.  Positive results do  not rule out bacterial infection or co-infection with other viruses. If result is PRESUMPTIVE POSTIVE SARS-CoV-2 nucleic acids MAY BE PRESENT.   A presumptive positive result was obtained on the submitted specimen   and confirmed on repeat testing.  While 2019 novel coronavirus  (SARS-CoV-2) nucleic acids may be present in the submitted sample  additional confirmatory testing may be necessary for epidemiological  and / or clinical management purposes  to differentiate between  SARS-CoV-2 and other Sarbecovirus currently known to infect humans.  If clinically indicated additional testing with an alternate test  methodology [phone]) is advised. The SARS-CoV-2 RNA is generally  detectable in upper and lower respiratory sp ecimens during the acute  phase of infection. The expected result is Negative. Fact Sheet for Patients:  StrictlyIdeas.no Fact Sheet for Healthcare Providers: BankingDealers.co.za This test is not yet approved or cleared by the [place] FDA and has been authorized for detection and/or diagnosis of SARS-CoV-2 by FDA under an Emergency Use Authorization (EUA).  This EUA will remain in effect (meaning this test can be used) for the duration of the COVID-19 declaration under Section 564(b)(1) of the Act, 21 U.S.C. section 360bbb-3(b)(1), unless the authorization is terminated or revoked sooner. Performed at [facility], [place] [address], [place], [place] [address]   MRSA PCR Screening     Status: None   Collection Time: 01/05/19 10:48 PM   Specimen: Nasopharyngeal  Result Value Ref Range Status   MRSA by PCR NEGATIVE NEGATIVE Final    Comment:        The GeneXpert MRSA Assay (FDA approved for NASAL specimens only), is one component of a comprehensive MRSA colonization surveillance program. It is not intended to diagnose MRSA infection nor to guide or monitor treatment for MRSA infections. Performed at [facility], [place] [address] [place], [place] [address]   Urine culture     Status: None   Collection Time: 01/06/19 11:34 AM   Specimen: Urine, Random  Result Value Ref Range Status   Specimen Description  URINE, RANDOM  Final   Special Requests NONE  Final   Culture   Final    NO GROWTH Performed at [facility], [place] [address], [place], [place] [address]    Report Status 01/07/2019 FINAL  Final      Studies: No results found.  Scheduled Meds: . sodium chloride   Intravenous Once  . sodium chloride   Intravenous Once  . sodium chloride   Intravenous Once  . acetaminophen  650 mg Oral Once  . amitriptyline  100 mg Oral QHS  . atorvastatin  40 mg Oral Daily  . calcitRIOL  1 mcg Oral Q T,Th,Sa-HD  . calcium carbonate  800 mg of elemental calcium Oral Daily  . Chlorhexidine Gluconate Cloth  6 each Topical Q0600  . [START ON 01/15/2019] darbepoetin (ARANESP) injection - DIALYSIS  200 mcg Intravenous Q Sat-HD  . DULoxetine  30 mg Oral Daily  . ferric citrate  420 mg Oral TID WC  . hydrALAZINE  50 mg Oral TID  . hydrocortisone   Topical BID  . insulin aspart  0-5 Units Subcutaneous QHS  . insulin aspart  0-9 Units Subcutaneous TID WC  . insulin glargine  20 Units Subcutaneous Q2200  . isosorbide mononitrate  60 mg Oral Daily  . levothyroxine  50 mcg Oral Q0600  . sodium chloride flush  3 mL Intravenous Q12H    Continuous Infusions: . sodium chloride 10 mL/hr at 01/07/19 1321     LOS: 3 days     Alma Friendly, MD [facility]  If 7PM-7AM, please contact night-coverage [email or web address] 01/09/2019, 3:40 PM

## 2019-01-09 NOTE — Progress Notes (Addendum)
Urology Progress Note   63 y.o. male with ESRD on dialysis who developed gross hematuria after foley placement.  Due to extensive clot burden taken for cystoscopy with clot EVAC under anesthesia on 8/7.  Intraoperatively 1.5 L of clot removed but no source of bleeding was found.  24 French three-way catheter was placed.    Interval 8/9: Hemoglobin 6.9 this morning.  Catheter did not clogged overnight and urine was crystal clear on [place] CBI this morning.  Urine down CBI to slow drip.  Hopefully can wean off in the next 24 hours.    No plan for OR today.  Regular diet, n.p.o. at midnight in case bleeding returns   Objective: Vital signs in last 24 hours: Temp:  [98 F (36.7 C)-98.7 F (37.1 C)] 98 F (36.7 C) (08/09 0933) Pulse Rate:  [80-121] 85 (08/09 0933) Resp:  [17-20] 18 (08/09 0933) BP: (97-159)/(54-103) 136/69 (08/09 0933) SpO2:  [93 %-100 %] 100 % (08/09 0933) Weight:  [134.3 kg-136 kg] 134.3 kg (08/08 2204)  Intake/Output from previous day: 08/08 0701 - 08/09 0700 In: Jay [P.O.:720; Blood:632] Out: 03159 [Urine:50075] Intake/Output this shift: Total I/O In: 0  Out: 3550 [Urine:3550]  Physical Exam:  General: Obese male, uncomfortable but not in distress GU: 24 fr foley, 30cc in balloon, CBI fast rate, crystal-clear  Lab Results: Recent Labs    01/07/19 1423 01/08/19 1325 01/09/19 0530  HGB 8.2* 6.0* 6.9*  HCT 24.0* 18.9* 21.1*   BMET Recent Labs    01/08/19 1325 01/09/19 0530  NA 131* 136  K 5.3* 4.7  CL 95* 98  CO2 20* 24  GLUCOSE 233* 141*  BUN 68* 47*  CREATININE 9.10* 6.68*  CALCIUM 6.6* 7.7*     Studies/Results: No results found. Assessment/Plan:  63 y.o. male with large clot burden in bladder despite maximum size hematuria catheter.   -CBI titrate to light pink -N.p.o. at midnight in case bleeding returns - Appreciate hospital assistance - Urology will continue to follow along - Follow-up will be requested in [facility]  for trial of void as well as discussion of elevated PSA and possible prostate biopsy given sclerotic lesions found on CT     LOS: 3 days   Tharon Aquas 01/09/2019, 10:16 AM

## 2019-01-09 NOTE — Evaluation (Signed)
Physical Therapy Evaluation Patient Details Name: Richard Hall MRN: [id number] DOB: [birth year] Today's Date: 01/09/2019   History of Present Illness  Richard Hall is a 63 y.o. male with medical history significant of constipation; HLD; depression; DM;afib (patient denies, not on [facility]); hypothyroidism; OSA on CPAP; HTN; and ESRD on TTS HD since January presenting with dizziness.  He went to [facility] yesterday for urinary retention.  They put a foley in and his urine has been increasingly bloody.    Clinical Impression  Pt admitted with above diagnosis. Pt currently with functional limitations due to the deficits listed below (see PT Problem List). PTA, pt independent at home. Today, poor balance and strength requiring hands on assistance for side stepping along bed. Will progress OOB mobility likely to HHPT, if slow progress will update recs to SNF.  Pt will benefit from skilled PT to increase their independence and safety with mobility to allow discharge to the venue listed below.       Follow Up Recommendations Home health PT;Supervision for mobility/OOB(pending progress- otherwise SNF)    Equipment Recommendations    RW   Recommendations for Other Services OT consult     Precautions / Restrictions Precautions Precautions: Fall Precaution Comments: watch Hgb Restrictions Weight Bearing Restrictions: No      Mobility  Bed Mobility Overal bed mobility: Modified Independent                Transfers Overall transfer level: Needs assistance Equipment used: None             General transfer comment: standing with imbalance, side stepping along bed with premature sit. reports he feels weak(min A at times for stability)  Ambulation/Gait                Stairs            Wheelchair Mobility    Modified Rankin (Stroke Patients Only)       Balance Overall balance assessment: Needs assistance   Sitting balance-Leahy Scale: Fair       Standing balance-Leahy  Scale: Poor                               Pertinent Vitals/Pain Pain Assessment: No/denies pain    Home Living Family/patient expects to be discharged to:: Private residence Living Arrangements: Alone Available Help at Discharge: Available PRN/intermittently Type of Home: House Home Access: Level entry     Home Layout: One level Home Equipment: None      Prior Function Level of Independence: Independent         Comments: sometimes walks with SPC     Hand Dominance   Dominant Hand: Right    Extremity/Trunk Assessment   Upper Extremity Assessment Upper Extremity Assessment: Overall WFL for tasks assessed    Lower Extremity Assessment Lower Extremity Assessment: Overall WFL for tasks assessed       Communication   Communication: No difficulties  Cognition Arousal/Alertness: Awake/alert Behavior During Therapy: WFL for tasks assessed/performed                                          General Comments      Exercises     Assessment/Plan    PT Assessment Patient needs continued PT services  PT Problem List Decreased strength       PT  Treatment Interventions DME instruction;Gait training;Stair training;Therapeutic activities;Functional mobility training;Therapeutic exercise;Balance training    PT Goals (Current goals can be found in the Care Plan section)  Acute Rehab PT Goals PT Goal Formulation: With patient Time For Goal Achievement: 01/23/19 Potential to Achieve Goals: Good    Frequency Min 3X/week   Barriers to discharge Decreased caregiver support      Co-evaluation               AM-PAC PT "6 Clicks" Mobility  Outcome Measure Help needed turning from your back to your side while in a flat bed without using bedrails?: None Help needed moving from lying on your back to sitting on the side of a flat bed without using bedrails?: None Help needed moving to and from a bed to a chair (including a  wheelchair)?: A Little Help needed standing up from a chair using your arms (e.g., wheelchair or bedside chair)?: A Little Help needed to walk in hospital room?: A Lot Help needed climbing 3-5 steps with a railing? : A Lot 6 Click Score: 18    End of Session Equipment Utilized During Treatment: Gait belt Activity Tolerance: Patient tolerated treatment well Patient left: in bed Nurse Communication: Mobility status PT Visit Diagnosis: Unsteadiness on feet (R26.81)    Time: 2574-9355 PT Time Calculation (min) (ACUTE ONLY): 25 min   Charges:   PT Evaluation $PT Eval Low Complexity: 1 Low PT Treatments $Therapeutic Activity: 8-22 mins        Reinaldo Berber, PT, DPT Acute Rehabilitation Services Pager: [phone] Office: [phone]    Reinaldo Berber 01/09/2019, 12:48 PM

## 2019-01-09 NOTE — Progress Notes (Addendum)
[facility] Progress Note   Subjective:   Patient seen in room. Sitting up, eating breakfast. Receiving 2 units PRBC this AM. Reports his catheter has been working much better this AM and appears lighter in color. Denies SOB, dyspnea, CP, palpitations, abdominal pain, N/V/D.  Objective Vitals:   01/09/19 0337 01/09/19 0356 01/09/19 0845 01/09/19 0933  BP: (!) 142/103 135/68  136/69  Pulse: (!) 121  80 85  Resp: 20   18  Temp: 98.4 F (36.9 C)  98 F (36.7 C) 98 F (36.7 C)  TempSrc: Oral  Oral Oral  SpO2: 93%  100% 100%  Weight:      Height:       Physical Exam General: Well developed, obese male in NAD Heart: RRR, no murmurs, rubs or gallops Lungs: CTA bilaterally without wheezing, rhonchi or rales Abdomen: Soft, non-distended, non-tender, + BS GU: Foley cath draining pale pink urine Extremities: No peripheral edema Dialysis Access: LUE AVF + thrill  Additional Objective Labs: Basic Metabolic Panel: Recent Labs  Lab 01/07/19 0327 01/07/19 1423 01/08/19 1325 01/09/19 0530  NA 131* 134* 131* 136  K 5.2* 4.6 5.3* 4.7  CL 96*  --  95* 98  CO2 18*  --  20* 24  GLUCOSE 182*  --  233* 141*  BUN 72*  --  68* 47*  CREATININE 10.38*  --  9.10* 6.68*  CALCIUM 7.3*  --  6.6* 7.7*  PHOS  --   --  9.8*  --    Liver Function Tests: Recent Labs  Lab 01/05/19 1340 01/08/19 1325  AST 25  --   ALT <5  --   ALKPHOS 114  --   BILITOT 1.1  --   PROT 5.9*  --   ALBUMIN 2.7* 2.5*   CBC: Recent Labs  Lab 01/05/19 1340 01/06/19 0639 01/07/19 0327 01/07/19 1423 01/08/19 1325 01/09/19 0530  WBC 16.6* 13.7* 16.5*  --  15.4* 12.6*  NEUTROABS 14.4*  --  13.8*  --   --  9.8*  HGB 6.8* 6.7* 7.6* 8.2* 6.0* 6.9*  HCT 22.3* 20.9* 23.6* 24.0* 18.9* 21.1*  MCV 101.8* 97.7 96.3  --  97.4 98.1  PLT 223 202 213  --  215 230   Blood Culture    Component Value Date/Time   SDES URINE, RANDOM 01/06/2019 1134   SPECREQUEST NONE 01/06/2019 1134   CULT  01/06/2019  1134    NO GROWTH Performed at [facility], [place] [address], [place], [place] [address]    REPTSTATUS 01/07/2019 FINAL 01/06/2019 1134   CBG: Recent Labs  Lab 01/08/19 0658 01/08/19 1143 01/08/19 1808 01/08/19 2122 01/09/19 0718  GLUCAP 112* 177* 151* 246* 121*   Medications: . sodium chloride 10 mL/hr at 01/07/19 1321   . sodium chloride   Intravenous Once  . sodium chloride   Intravenous Once  . sodium chloride   Intravenous Once  . acetaminophen  650 mg Oral Once  . amitriptyline  100 mg Oral QHS  . atorvastatin  40 mg Oral Daily  . calcitRIOL  1 mcg Oral Q T,Th,Sa-HD  . calcium carbonate  800 mg of elemental calcium Oral Daily  . Chlorhexidine Gluconate Cloth  6 each Topical Q0600  . [START ON 01/15/2019] darbepoetin (ARANESP) injection - DIALYSIS  200 mcg Intravenous Q Sat-HD  . DULoxetine  30 mg Oral Daily  . ferric citrate  420 mg Oral TID WC  . hydrALAZINE  50 mg Oral TID  .  hydrocortisone   Topical BID  . insulin aspart  0-5 Units Subcutaneous QHS  . insulin aspart  0-9 Units Subcutaneous TID WC  . insulin glargine  20 Units Subcutaneous Q2200  . isosorbide mononitrate  60 mg Oral Daily  . levothyroxine  50 mcg Oral Q0600  . sodium chloride flush  3 mL Intravenous Q12H    [facility] Time:4h 36min; 180NRe; BFR 400/DFR 700; EDW 136kg; 2K/3Ca; heparin 4450 unit bolus Mircera 100 mcg IV q 2 weeks- last 12/14/2018 Calcitriol 44mcg PO TIW Venofer 100mg  IV q HD(last dose 8/4) Tums 400mg  2QAC BID, 1 c snack, 4 QHS Auryxia 210mg  2tabs AC  Assessment/Plan: 1. Hematuria/Anemia:Urology following. CT showed massively dilated bladder with huge mass or clot within the bladder, sclerotic lesions in the vertebral bodies consistent with metastatic disease. FOBT negative, hematuria as above.SP cystoscopy by urology on 8/7 which showed large hematoma which was completely evacuated and no masses seen post-evacuation. Pt Given  Aranesp 200 mcg 01/08/2019. Receiving 2 units PRBC today- has received 7 total to date.Follow HGB.  2. Hydronephrosis:CT revealed slight prominence of renal collecting systems felt to be due to compression of the ureters by bladder distention. Will do [place] tomorrow to f/u bilat hydro after evac of bladder hematoma.  3. ESRD:TTS-next dialysis 01/11/2019.K+ 4.7 No heparin with dialysis.DC linear sodium. Needs at least 4 hour treatment. 4. Hypertension/volume:Does not appear volume overloaded on examor CXR. BP slightly elevated today. Will need lower EDW at discharge.  5. Metabolic bone disease:Calcium 7.7. Was running in the 6's at outpatient unit and was taking Tums 4 QHS per most recent note. Continue high Ca bath. Continue auryxia. 6. Nutrition:Renal diet with fluid restrictions  Anice Paganini, PA-C 01/09/2019, 11:01 AM  [facility] Pager: [phone]  Pt seen, examined and agree w assess/plan as above with additions as indicated.  [facility] 01/09/2019, 3:37 PM

## 2019-01-09 NOTE — Progress Notes (Signed)
Pt continues to refuse cpap for > 3 days.  Order si now dc'd per protocol. RT will continue to monitor.

## 2019-01-10 ENCOUNTER — Encounter (HOSPITAL_COMMUNITY): Payer: Self-pay | Admitting: Anesthesiology

## 2019-01-10 LAB — CBC WITH DIFFERENTIAL/PLATELET
Abs Immature Granulocytes: 0.34 10*3/uL — ABNORMAL HIGH (ref 0.00–0.07)
Basophils Absolute: 0 10*3/uL (ref 0.0–0.1)
Basophils Relative: 0 %
Eosinophils Absolute: 0.2 10*3/uL (ref 0.0–0.5)
Eosinophils Relative: 1 %
HCT: 22.3 % — ABNORMAL LOW (ref 39.0–52.0)
Hemoglobin: 7.2 g/dL — ABNORMAL LOW (ref 13.0–17.0)
Immature Granulocytes: 3 %
Lymphocytes Relative: 13 %
Lymphs Abs: 1.5 10*3/uL (ref 0.7–4.0)
MCH: 31.4 pg (ref 26.0–34.0)
MCHC: 32.3 g/dL (ref 30.0–36.0)
MCV: 97.4 fL (ref 80.0–100.0)
Monocytes Absolute: 1 10*3/uL (ref 0.1–1.0)
Monocytes Relative: 9 %
Neutro Abs: 8.8 10*3/uL — ABNORMAL HIGH (ref 1.7–7.7)
Neutrophils Relative %: 74 %
Platelets: 232 10*3/uL (ref 150–400)
RBC: 2.29 MIL/uL — ABNORMAL LOW (ref 4.22–5.81)
RDW: 16.5 % — ABNORMAL HIGH (ref 11.5–15.5)
WBC: 11.9 10*3/uL — ABNORMAL HIGH (ref 4.0–10.5)
nRBC: 2.9 % — ABNORMAL HIGH (ref 0.0–0.2)

## 2019-01-10 LAB — POCT I-STAT EG7
Acid-base deficit: 2 mmol/L (ref 0.0–2.0)
Bicarbonate: 24.7 mmol/L (ref 20.0–28.0)
Calcium, Ion: 0.96 mmol/L — ABNORMAL LOW (ref 1.15–1.40)
HCT: 20 % — ABNORMAL LOW (ref 39.0–52.0)
Hemoglobin: 6.8 g/dL — CL (ref 13.0–17.0)
O2 Saturation: 83 %
Patient temperature: 37.3
Potassium: 5 mmol/L (ref 3.5–5.1)
Sodium: 131 mmol/L — ABNORMAL LOW (ref 135–145)
TCO2: 26 mmol/L (ref 22–32)
pCO2, Ven: 50.1 mmHg (ref 44.0–60.0)
pH, Ven: 7.302 (ref 7.250–7.430)
pO2, Ven: 54 mmHg — ABNORMAL HIGH (ref 32.0–45.0)

## 2019-01-10 LAB — RENAL FUNCTION PANEL
Albumin: 2.5 g/dL — ABNORMAL LOW (ref 3.5–5.0)
Anion gap: 14 (ref 5–15)
BUN: 64 mg/dL — ABNORMAL HIGH (ref 8–23)
CO2: 25 mmol/L (ref 22–32)
Calcium: 7.2 mg/dL — ABNORMAL LOW (ref 8.9–10.3)
Chloride: 97 mmol/L — ABNORMAL LOW (ref 98–111)
Creatinine, Ser: 8.61 mg/dL — ABNORMAL HIGH (ref 0.61–1.24)
GFR calc Af Amer: 7 mL/min — ABNORMAL LOW (ref >60)
GFR calc non Af Amer: 6 mL/min — ABNORMAL LOW (ref >60)
Glucose, Bld: 140 mg/dL — ABNORMAL HIGH (ref 70–99)
Phosphorus: 5.1 mg/dL — ABNORMAL HIGH (ref 2.5–4.6)
Potassium: 4 mmol/L (ref 3.5–5.1)
Sodium: 136 mmol/L (ref 135–145)

## 2019-01-10 LAB — GLUCOSE, CAPILLARY
Glucose-Capillary: 130 mg/dL — ABNORMAL HIGH (ref 70–99)
Glucose-Capillary: 239 mg/dL — ABNORMAL HIGH (ref 70–99)
Glucose-Capillary: 129 mg/dL — ABNORMAL HIGH (ref 70–99)
Glucose-Capillary: 200 mg/dL — ABNORMAL HIGH (ref 70–99)

## 2019-01-10 LAB — BASIC METABOLIC PANEL
Anion gap: 14 (ref 5–15)
BUN: 60 mg/dL — ABNORMAL HIGH (ref 8–23)
CO2: 22 mmol/L (ref 22–32)
Calcium: 6.8 mg/dL — ABNORMAL LOW (ref 8.9–10.3)
Chloride: 98 mmol/L (ref 98–111)
Creatinine, Ser: 8.41 mg/dL — ABNORMAL HIGH (ref 0.61–1.24)
GFR calc Af Amer: 7 mL/min — ABNORMAL LOW (ref >60)
GFR calc non Af Amer: 6 mL/min — ABNORMAL LOW (ref >60)
Glucose, Bld: 120 mg/dL — ABNORMAL HIGH (ref 70–99)
Potassium: 4 mmol/L (ref 3.5–5.1)
Sodium: 134 mmol/L — ABNORMAL LOW (ref 135–145)

## 2019-01-10 LAB — CBC
HCT: 25.6 % — ABNORMAL LOW (ref 39.0–52.0)
Hemoglobin: 8.5 g/dL — ABNORMAL LOW (ref 13.0–17.0)
MCH: 31.8 pg (ref 26.0–34.0)
MCHC: 33.2 g/dL (ref 30.0–36.0)
MCV: 95.9 fL (ref 80.0–100.0)
Platelets: 255 10*3/uL (ref 150–400)
RBC: 2.67 MIL/uL — ABNORMAL LOW (ref 4.22–5.81)
RDW: 16.4 % — ABNORMAL HIGH (ref 11.5–15.5)
WBC: 12.9 10*3/uL — ABNORMAL HIGH (ref 4.0–10.5)
nRBC: 3.6 % — ABNORMAL HIGH (ref 0.0–0.2)

## 2019-01-10 LAB — APTT: aPTT: 31 seconds (ref 24–36)

## 2019-01-10 LAB — PREPARE RBC (CROSSMATCH)

## 2019-01-10 LAB — PROTIME-INR
INR: 1.2 (ref 0.8–1.2)
Prothrombin Time: 14.7 seconds (ref 11.4–15.2)

## 2019-01-10 LAB — PLATELET FUNCTION ASSAY: Collagen / Epinephrine: 81 seconds (ref 0–193)

## 2019-01-10 LAB — FIBRINOGEN: Fibrinogen: 547 mg/dL — ABNORMAL HIGH (ref 210–475)

## 2019-01-10 MED ORDER — SODIUM CHLORIDE 0.9% IV SOLUTION
Freq: Once | INTRAVENOUS | Status: AC
  Administered 2019-01-10: 09:00:00 via INTRAVENOUS

## 2019-01-10 MED ORDER — CHLORHEXIDINE GLUCONATE CLOTH 2 % EX PADS: 6.0000 | MEDICATED_PAD | Freq: Every day | CUTANEOUS | Status: DC

## 2019-01-10 MED ORDER — SODIUM CHLORIDE 0.9 % IV SOLN
1.0000 g | INTRAVENOUS | Status: DC
  Administered 2019-01-10 – 2019-01-11 (×2): 1 g via INTRAVENOUS
  Filled 2019-01-10 (×2): qty 1
  Filled 2019-01-10: qty 10

## 2019-01-10 NOTE — Progress Notes (Signed)
Physical Therapy Treatment Patient Details Name: Richard Hall MRN: [id number] DOB: April 08, 1956 Today's Date: 01/10/2019    History of Present Illness Richard Hall is a 63 y.o. male with medical history significant of constipation; HLD; depression; DM;afib (patient denies, not on [facility]); hypothyroidism; OSA on CPAP; HTN; and ESRD on TTS HD since January presenting with dizziness.  He went to [facility] yesterday for urinary retention.  They put a foley in and his urine has been increasingly bloody.    PT Comments    Patient progressing with mobility today, now supervision with gait short distance utilizing RW. Pt reports weaker than baseline but feels confident returning home. rec HHPT.    Follow Up Recommendations  Home health PT;Supervision for mobility/OOB     Equipment Recommendations       Recommendations for Other Services OT consult     Precautions / Restrictions Precautions Precautions: Fall Precaution Comments: watch Hgb Restrictions Weight Bearing Restrictions: No    Mobility  Bed Mobility Overal bed mobility: Modified Independent                Transfers Overall transfer level: Modified independent               General transfer comment: (min A at times for stability)  Ambulation/Gait Ambulation/Gait assistance: Modified independent (Device/Increase time) Gait Distance (Feet): 30 Feet Assistive device: Rolling walker (2 wheeled) Gait Pattern/deviations: Step-to pattern Gait velocity: decreased   General Gait Details: slow but careful and steady with RW, no LOB.   Hall             Wheelchair Mobility    Modified Rankin (Stroke Patients Only)       Balance Overall balance assessment: Needs assistance   Sitting balance-Leahy Scale: Fair       Standing balance-Leahy Scale: Fair                              Cognition Arousal/Alertness: Awake/alert Behavior During Therapy: WFL for tasks assessed/performed                                          Exercises      General Comments        Pertinent Vitals/Pain      Home Living                      Prior Function            PT Goals (current goals can now be found in the care plan section) Acute Rehab PT Goals PT Goal Formulation: With patient Time For Goal Achievement: 01/23/19 Potential to Achieve Goals: Good Progress towards PT goals: Progressing toward goals    Frequency    Min 3X/week      PT Plan      Co-evaluation              AM-PAC PT "6 Clicks" Mobility   Outcome Measure  Help needed turning from your back to your side while in a flat bed without using bedrails?: None Help needed moving from lying on your back to sitting on the side of a flat bed without using bedrails?: None Help needed moving to and from a bed to a chair (including a wheelchair)?: A Little Help needed standing up from a chair using your arms (e.g., wheelchair  or bedside chair)?: A Little Help needed to walk in hospital room?: A Lot Help needed climbing 3-5 steps with a railing? : A Lot 6 Click Score: 18    End of Session Equipment Utilized During Treatment: Gait belt Activity Tolerance: Patient tolerated treatment well Patient left: in bed Nurse Communication: Mobility status PT Visit Diagnosis: Unsteadiness on feet (R26.81)     Time: 1610-9604 PT Time Calculation (min) (ACUTE ONLY): 15 min  Charges:  $Gait Training: 8-22 mins                    Reinaldo Berber, PT, DPT Acute Rehabilitation Services Pager: [phone] Office: [phone]    Reinaldo Berber 01/10/2019, 5:32 PM

## 2019-01-10 NOTE — Progress Notes (Signed)
Urology Progress Note:  Renal ultrasound from 01/09/2019 showed large clot within the bladder.  CBI was off for 8/10 but urine turned dark wine colored and was thick with clots.  Hand irrigated the patient's bladder this afternoon at 5 PM with return of 500 cc of poorly coagulated clot.   Required 1 transfusion today.  Hemoglobin responded appropriately 8.5 from 7.2  Plan: - Due to persistent hematuria patient needs another clot evacuation and fulguration.  We will have to be more aggressive with this fulguration as there was no clear source of bleeding during the last procedure.   - NPO at midnight  - OR time tomorrow TBD. Will check with schedule in AM and coordinate transfer to [name] with [facility]  -Please obtain urine culture (ordered) and then start preoperative antibiotics (Cipro would be appropriate if can be safely renally dosed)

## 2019-01-10 NOTE — Care Management Important Message (Signed)
Important Message  Patient Details  Name: Richard Hall MRN: [id number] Date of Birth: 1955/07/22   Medicare Important Message Given:  Yes     Orbie Pyo 01/10/2019, 4:01 PM

## 2019-01-10 NOTE — Progress Notes (Signed)
PROGRESS NOTE  Rynell Ciotti YBW:[id number] DOB: [birth year] DOA: 01/05/2019 PCP: [name], MD  HPI/Recap of past 24 hours: HPI from Dr [name] is a 63 y.o. male with medical history significant of constipation; HLD; depression; DM;afib (patient denies, not on [facility]); hypothyroidism; OSA on CPAP; HTN; and ESRD on TTS HD since January presenting with dizziness.  He went to [facility] yesterday for urinary retention.  They put a foley in and his urine has been increasingly bloody.  He became dizzy with standing and walking.  He got so dizzy while walking that he collapsed to the floor.  He was too weak to walk but did not lose consciousness.  No SOB.  No fever.  He has not noticed any blood in his stools.  The urine is "all blood", clotting the bag.  He pees daily and was peeing less than usual yesterday. Pt was seen yesterday in the [facility] ER. A prior bladder scan on 7/20 showed 4129 cc of urine in his bladder.  Renal US from 7/30 showed moderate R and mild L hydronephrosis with evidence of chronic medical renal disease. Pt had normal EGD and colonoscopy and video capsule endoscopy showed 1 AVM.  Last visit was in 12/19. In the ED, pt hypotensive with EMS, received 500 cc IVF.  Mentating well despite low BP.  Hgb 6.8, looks pale; prior Hgb 10.  Stool dark black, received 1 unit emergency release blood.  Denies h/o GI bleed.  No NSAIDs.  Urology consulted.  Patient admitted for further management.   Today, patient denies any new complaints   Assessment/Plan: Principal Problem:   Symptomatic anemia Active Problems:   OSA on CPAP   Hypothyroidism (acquired)   Essential hypertension   Dyslipidemia   Diabetes mellitus with end-stage renal disease (HCC)   Gross hematuria   Gastrointestinal hemorrhage with melena   Acute on chronic symptomatic blood loss anemia likely 2/2 gross hematuria s/p cystoscopy, clot evacuation on 01/07/2019 Foley currently draining dark red urine Baseline hemoglobin around  10-11 So far has received 7 units of PRBC, last on 01/09/2019, plan to transfuse another 1 unit of PRBC on 01/10/2019 UA was turbid and significantly red, UC with no growth CT stone showed massively distended bladder with a huge mass or clot within the bladder, slight prominence of the renal collecting systems bilaterally including the ureters due to compression of the ureters by the markedly distended urinary bladder.  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 cancer Status post cystoscopy, clot evacuation on 01/07/2019 Repeat ultrasound renal showing persistent mild right-sided hydronephrosis, with large echogenic lesion measuring up to 11.9 cm within the bladder, could reflect mass or hemorrhage Monitor very closely, for signs of significant hematuria Daily CBC  Leukocytosis Afebrile Chest x-ray unremarkable UC no growth Daily CBC  Near syncope Likely secondary to acute on chronic blood loss as mentioned above CT head unremarkable Monitor closely  Unlikely GI bleeding Had extensive GI work-up in May and September 2019 FOBT repeated by GI negative Dark stools likely due to iron supplementation GI consulted, no further work-up from the standpoint  ESRD on HD T/TH/S Nephrology on board  Diabetes mellitus type 2 A1c 5.9 SSI, Lantus, hypoglycemic protocol, Accu-Cheks  Hypertension Continue hydralazine, imdur  Hypothyroidism Continue Synthroid  Obesity Lifestyle modification advised        Malnutrition Type:      Malnutrition Characteristics:      Nutrition Interventions:  Estimated body mass index is 40.16 kg/m as calculated from the following:   Height as of this encounter: 6' (1.829 m).   Weight as of this encounter: 134.3 kg.     Code Status: Full  Family Communication: None at bedside  Disposition Plan: To be  determined   Consultants:  Urology  Nephrology  GI  Procedures:  Cystoscopy, clot evacuation on 01/07/2019  Antimicrobials:  None  DVT prophylaxis: SCDs   Objective: Vitals:   01/10/19 0903 01/10/19 0930 01/10/19 0955 01/10/19 1304  BP: 116/75 (!) 152/65 (!) 151/83 (!) 143/76  Pulse: (!) 104 97 97 93  Resp: 18 18 18 18   Temp: 98 F (36.7 C) 97.8 F (36.6 C) 98 F (36.7 C) 98.2 F (36.8 C)  TempSrc: Oral Oral Oral Oral  SpO2: 97% 97% 100% 96%  Weight:      Height:        Intake/Output Summary (Last 24 hours) at 01/10/2019 1601 Last data filed at 01/10/2019 1517 Gross per 24 hour  Intake 4162 ml  Output 12325 ml  Net -8163 ml   Filed Weights   01/08/19 1715 01/08/19 2204 01/09/19 2027  Weight: 135.5 kg 134.3 kg 134.3 kg    Exam:  General: NAD, obese  Cardiovascular: S1, S2 present  Respiratory: CTAB  Abdomen: Soft, nontender, nondistended, bowel sounds present  Musculoskeletal: No bilateral pedal edema noted  Skin: Normal  Psychiatry: Normal mood    Data Reviewed: CBC: Recent Labs  Lab 01/05/19 1340  01/07/19 0327  01/08/19 1325 01/09/19 0530 01/09/19 2017 01/10/19 0440 01/10/19 1505  WBC 16.6*   < > 16.5*  --  15.4* 12.6*  --  11.9* PENDING  NEUTROABS 14.4*  --  13.8*  --   --  9.8*  --  8.8*  --   HGB 6.8*   < > 7.6*   < > 6.0* 6.9* 7.4* 7.2* 8.5*  HCT 22.3*   < > 23.6*   < > 18.9* 21.1* 22.3* 22.3* 25.6*  MCV 101.8*   < > 96.3  --  97.4 98.1  --  97.4 95.9  PLT 223   < > 213  --  215 230  --  232 255   < > = values in this interval not displayed.   Basic Metabolic Panel: Recent Labs  Lab 01/07/19 0327  01/07/19 1618 01/08/19 1325 01/09/19 0530 01/10/19 0440 01/10/19 1505  NA 131*   < > 131* 131* 136 134* 136  K 5.2*   < > 5.0 5.3* 4.7 4.0 4.0  CL 96*  --   --  95* 98 98 97*  CO2 18*  --   --  20* 24 22 25   GLUCOSE 182*  --   --  233* 141* 120* 140*  BUN 72*  --   --  68* 47* 60* 64*  CREATININE 10.38*  --   --  9.10*  6.68* 8.41* 8.61*  CALCIUM 7.3*  --   --  6.6* 7.7* 6.8* 7.2*  PHOS  --   --   --  9.8*  --   --  5.1*   < > = values in this interval not displayed.   GFR: Estimated Creatinine Clearance: 12.5 mL/min (A) (by C-G formula based on SCr of 8.61 mg/dL (H)). Liver Function Tests: Recent Labs  Lab 01/05/19 1340 01/08/19 1325 01/10/19 1505  AST 25  --   --   ALT <5  --   --   ALKPHOS 114  --   --  BILITOT 1.1  --   --   PROT 5.9*  --   --   ALBUMIN 2.7* 2.5* 2.5*   No results for input(s): LIPASE, AMYLASE in the last 168 hours. No results for input(s): AMMONIA in the last 168 hours. Coagulation Profile: No results for input(s): INR, PROTIME in the last 168 hours. Cardiac Enzymes: No results for input(s): CKTOTAL, CKMB, CKMBINDEX, TROPONINI in the last 168 hours. BNP (last 3 results) No results for input(s): PROBNP in the last 8760 hours. HbA1C: No results for input(s): HGBA1C in the last 72 hours. CBG: Recent Labs  Lab 01/09/19 1114 01/09/19 1723 01/09/19 2027 01/10/19 0657 01/10/19 1110  GLUCAP 95 189* 153* 130* 239*   Lipid Profile: No results for input(s): CHOL, HDL, LDLCALC, TRIG, CHOLHDL, LDLDIRECT in the last 72 hours. Thyroid Function Tests: No results for input(s): TSH, T4TOTAL, FREET4, T3FREE, THYROIDAB in the last 72 hours. Anemia Panel: No results for input(s): VITAMINB12, FOLATE, FERRITIN, TIBC, IRON, RETICCTPCT in the last 72 hours. Urine analysis:    Component Value Date/Time   COLORURINE RED (A) 01/06/2019 1118   APPEARANCEUR TURBID (A) 01/06/2019 1118   LABSPEC  01/06/2019 1118    TEST NOT REPORTED DUE TO COLOR INTERFERENCE OF URINE PIGMENT   PHURINE  01/06/2019 1118    TEST NOT REPORTED DUE TO COLOR INTERFERENCE OF URINE PIGMENT   GLUCOSEU (A) 01/06/2019 1118    TEST NOT REPORTED DUE TO COLOR INTERFERENCE OF URINE PIGMENT   HGBUR (A) 01/06/2019 1118    TEST NOT REPORTED DUE TO COLOR INTERFERENCE OF URINE PIGMENT   BILIRUBINUR (A) 01/06/2019 1118     TEST NOT REPORTED DUE TO COLOR INTERFERENCE OF URINE PIGMENT   KETONESUR (A) 01/06/2019 1118    TEST NOT REPORTED DUE TO COLOR INTERFERENCE OF URINE PIGMENT   PROTEINUR (A) 01/06/2019 1118    TEST NOT REPORTED DUE TO COLOR INTERFERENCE OF URINE PIGMENT   NITRITE (A) 01/06/2019 1118    TEST NOT REPORTED DUE TO COLOR INTERFERENCE OF URINE PIGMENT   LEUKOCYTESUR (A) 01/06/2019 1118    TEST NOT REPORTED DUE TO COLOR INTERFERENCE OF URINE PIGMENT   Sepsis Labs: @LABRCNTIP (procalcitonin:4,lacticidven:4)  ) Recent Results (from the past 240 hour(s))  SARS Coronavirus 2 [facility] order, Performed in [facility] hospital lab) Nasopharyngeal Nasopharyngeal Swab     Status: None   Collection Time: 01/05/19  5:50 PM   Specimen: Nasopharyngeal Swab  Result Value Ref Range Status   SARS Coronavirus 2 NEGATIVE NEGATIVE Final    Comment: (NOTE) If result is NEGATIVE SARS-CoV-2 target nucleic acids are NOT DETECTED. The SARS-CoV-2 RNA is generally detectable in upper and lower  respiratory specimens during the acute phase of infection. The lowest  concentration of SARS-CoV-2 viral copies this assay can detect is 250  copies / mL. A negative result does not preclude SARS-CoV-2 infection  and should not be used as the sole basis for treatment or other  patient management decisions.  A negative result may occur with  improper specimen collection / handling, submission of specimen other  than nasopharyngeal swab, presence of viral mutation(s) within the  areas targeted by this assay, and inadequate number of viral copies  (<250 copies / mL). A negative result must be combined with clinical  observations, patient history, and epidemiological information. If result is POSITIVE SARS-CoV-2 target nucleic acids are DETECTED. The SARS-CoV-2 RNA is generally detectable in upper and lower  respiratory specimens dur ing the acute phase of infection.  Positive  results are indicative of active infection  with SARS-CoV-2.  Clinical  correlation with patient history and other diagnostic information is  necessary to determine patient infection status.  Positive results do  not rule out bacterial infection or co-infection with other viruses. If result is PRESUMPTIVE POSTIVE SARS-CoV-2 nucleic acids MAY BE PRESENT.   A presumptive positive result was obtained on the submitted specimen  and confirmed on repeat testing.  While 2019 novel coronavirus  (SARS-CoV-2) nucleic acids may be present in the submitted sample  additional confirmatory testing may be necessary for epidemiological  and / or clinical management purposes  to differentiate between  SARS-CoV-2 and other Sarbecovirus currently known to infect humans.  If clinically indicated additional testing with an alternate test  methodology [phone]) is advised. The SARS-CoV-2 RNA is generally  detectable in upper and lower respiratory sp ecimens during the acute  phase of infection. The expected result is Negative. Fact Sheet for Patients:  StrictlyIdeas.no Fact Sheet for Healthcare Providers: BankingDealers.co.za This test is not yet approved or cleared by the [place] FDA and has been authorized for detection and/or diagnosis of SARS-CoV-2 by FDA under an Emergency Use Authorization (EUA).  This EUA will remain in effect (meaning this test can be used) for the duration of the COVID-19 declaration under Section 564(b)(1) of the Act, 21 U.S.C. section 360bbb-3(b)(1), unless the authorization is terminated or revoked sooner. Performed at [facility], [place] [address], [place], [place] [address]   MRSA PCR Screening     Status: None   Collection Time: 01/05/19 10:48 PM   Specimen: Nasopharyngeal  Result Value Ref Range Status   MRSA by PCR NEGATIVE NEGATIVE Final    Comment:        The GeneXpert MRSA Assay (FDA approved for NASAL specimens only), is one component of  a comprehensive MRSA colonization surveillance program. It is not intended to diagnose MRSA infection nor to guide or monitor treatment for MRSA infections. Performed at [facility], [place] [address] [place], [place] [address]   Urine culture     Status: None   Collection Time: 01/06/19 11:34 AM   Specimen: Urine, Random  Result Value Ref Range Status   Specimen Description URINE, RANDOM  Final   Special Requests NONE  Final   Culture   Final    NO GROWTH Performed at [facility], [place] [address], [place], [place] [address]    Report Status 01/07/2019 FINAL  Final      Studies: US Renal  Result Date: 01/09/2019 CLINICAL DATA:  Initial evaluation for hydronephrosis. EXAM: RENAL / URINARY TRACT ULTRASOUND COMPLETE COMPARISON:  Prior CT from 02/05/2019. FINDINGS: Right Kidney: Renal measurements: 13.1 x 6.0 x 6.6 cm = volume: 272.3 mL. Increased echogenicity within the renal parenchyma. Underlying mild hydronephrosis. 2.4 x 2.2 x 2.7 cm exophytic cyst extends from the interpolar region. Left Kidney: Renal measurements: 16.3 x 6.6 x 5.3 cm = volume: 296.9 mL. Mildly increased echogenicity within the renal parenchyma, suggesting medical renal disease. No visible hydronephrosis. 1.7 x 2.0 x 1.5 cm simple exophytic cyst extends from the interpolar region. Bladder: Foley catheter partially visualized, tip projecting within an echogenic lesion. Large echogenic mass-like opacity measuring up to 11.9 cm, concerning for mass and/or hemorrhage, similar to prior CT. Overall bladder is moderately distended. IMPRESSION: 1. Persistent mild right-sided hydronephrosis. No significant left-sided obstructive uropathy. 2. Increased echogenicity within the renal parenchyma, suggesting a degree of underlying medical renal disease. 3. Large  echogenic lesion measuring up to 11.9 cm within the bladder, which could reflect mass and/or hemorrhage. Foley catheter in place with tip projecting within this  lesion. Persistent moderate distension of the urinary bladder. Bedside check to ensure Foley catheter is properly functioning suggested. 4. Simple bilateral renal cysts as above. Electronically Signed   By: Jeannine Boga M.D.   On: 01/09/2019 19:51    Scheduled Meds:  sodium chloride   Intravenous Once   sodium chloride   Intravenous Once   sodium chloride   Intravenous Once   acetaminophen  650 mg Oral Once   amitriptyline  100 mg Oral QHS   atorvastatin  40 mg Oral Daily   calcitRIOL  1 mcg Oral Q T,Th,Sa-HD   calcium carbonate  800 mg of elemental calcium Oral Daily   Chlorhexidine Gluconate Cloth  6 each Topical Q0600   Chlorhexidine Gluconate Cloth  6 each Topical Q0600   DULoxetine  30 mg Oral Daily   ferric citrate  420 mg Oral TID WC   hydrALAZINE  50 mg Oral TID   hydrocortisone   Topical BID   insulin aspart  0-5 Units Subcutaneous QHS   insulin aspart  0-9 Units Subcutaneous TID WC   insulin glargine  20 Units Subcutaneous Q2200   isosorbide mononitrate  60 mg Oral Daily   levothyroxine  50 mcg Oral Q0600   sodium chloride flush  3 mL Intravenous Q12H    Continuous Infusions:  sodium chloride 10 mL/hr at 01/07/19 1321     LOS: 4 days     Alma Friendly, MD [facility]  If 7PM-7AM, please contact night-coverage [email or web address] 01/10/2019, 4:01 PM

## 2019-01-10 NOTE — Anesthesia Preprocedure Evaluation (Deleted)
Anesthesia Evaluation    Airway        Dental   Pulmonary former smoker,           Cardiovascular hypertension,      Neuro/Psych    GI/Hepatic   Endo/Other  diabetes  Renal/GU      Musculoskeletal   Abdominal   Peds  Hematology   Anesthesia Other Findings   Reproductive/Obstetrics                             Anesthesia Physical Anesthesia Plan Anesthesia Quick Evaluation  

## 2019-01-10 NOTE — Plan of Care (Signed)
  Problem: Health Behavior/Discharge Planning: Goal: Ability to manage health-related needs will improve Outcome: Progressing   

## 2019-01-10 NOTE — Care Management Important Message (Signed)
Important Message  Patient Details  Name: Richard Hall MRN: [id number] Date of Birth: 1955/08/05   Medicare Important Message Given:  Yes     Orbie Pyo 01/10/2019, 4:02 PM

## 2019-01-10 NOTE — Progress Notes (Signed)
[facility] Progress Note   Subjective:   Urine starting to clear - fruit punch color this AM.  Urology cancelled plans for OR this AM.  1u pRBC this AM.   Objective Vitals:   01/09/19 2027 01/10/19 0441 01/10/19 0903 01/10/19 0903  BP: (!) 155/58 (!) 147/76  116/75  Pulse: 86 82 (!) 104 (!) 104  Resp: 19 20  18   Temp: 98.1 F (36.7 C) 98 F (36.7 C)  98 F (36.7 C)  TempSrc: Oral Oral  Oral  SpO2: 94% 98%  97%  Weight: 134.3 kg     Height:       Physical Exam General: Well developed, obese male in NAD Heart: RRR, no murmurs, rubs or gallops Lungs: CTA bilaterally without wheezing, rhonchi or rales Abdomen: Soft, non-distended, non-tender, + BS GU: Foley cath draining red urine without clots Extremities: No peripheral edema Dialysis Access: LUE AVF + thrill  Additional Objective Labs: Basic Metabolic Panel: Recent Labs  Lab 01/08/19 1325 01/09/19 0530 01/10/19 0440  NA 131* 136 134*  K 5.3* 4.7 4.0  CL 95* 98 98  CO2 20* 24 22  GLUCOSE 233* 141* 120*  BUN 68* 47* 60*  CREATININE 9.10* 6.68* 8.41*  CALCIUM 6.6* 7.7* 6.8*  PHOS 9.8*  --   --    Liver Function Tests: Recent Labs  Lab 01/05/19 1340 01/08/19 1325  AST 25  --   ALT <5  --   ALKPHOS 114  --   BILITOT 1.1  --   PROT 5.9*  --   ALBUMIN 2.7* 2.5*   CBC: Recent Labs  Lab 01/06/19 0639 01/07/19 0327  01/08/19 1325 01/09/19 0530 01/09/19 2017 01/10/19 0440  WBC 13.7* 16.5*  --  15.4* 12.6*  --  11.9*  NEUTROABS  --  13.8*  --   --  9.8*  --  8.8*  HGB 6.7* 7.6*   < > 6.0* 6.9* 7.4* 7.2*  HCT 20.9* 23.6*   < > 18.9* 21.1* 22.3* 22.3*  MCV 97.7 96.3  --  97.4 98.1  --  97.4  PLT 202 213  --  215 230  --  232   < > = values in this interval not displayed.   Blood Culture    Component Value Date/Time   SDES URINE, RANDOM 01/06/2019 1134   SPECREQUEST NONE 01/06/2019 1134   CULT  01/06/2019 1134    NO GROWTH Performed at [facility], [place] [address]  [place], [place] [address]    REPTSTATUS 01/07/2019 FINAL 01/06/2019 1134   CBG: Recent Labs  Lab 01/09/19 0718 01/09/19 1114 01/09/19 1723 01/09/19 2027 01/10/19 0657  GLUCAP 121* 95 189* 153* 130*   Medications: . sodium chloride 10 mL/hr at 01/07/19 1321   . sodium chloride   Intravenous Once  . sodium chloride   Intravenous Once  . sodium chloride   Intravenous Once  . sodium chloride   Intravenous Once  . acetaminophen  650 mg Oral Once  . amitriptyline  100 mg Oral QHS  . atorvastatin  40 mg Oral Daily  . calcitRIOL  1 mcg Oral Q T,Th,Sa-HD  . calcium carbonate  800 mg of elemental calcium Oral Daily  . Chlorhexidine Gluconate Cloth  6 each Topical Q0600  . [START ON 01/15/2019] darbepoetin (ARANESP) injection - DIALYSIS  200 mcg Intravenous Q Sat-HD  . DULoxetine  30 mg Oral Daily  . ferric citrate  420 mg Oral TID WC  . hydrALAZINE  50 mg Oral TID  . hydrocortisone   Topical BID  . insulin aspart  0-5 Units Subcutaneous QHS  . insulin aspart  0-9 Units Subcutaneous TID WC  . insulin glargine  20 Units Subcutaneous Q2200  . isosorbide mononitrate  60 mg Oral Daily  . levothyroxine  50 mcg Oral Q0600  . sodium chloride flush  3 mL Intravenous Q12H    Dialysis Orders: [facility] Time:4h 58min; 180NRe; BFR 400/DFR 700; EDW 136kg; 2K/3Ca; heparin 4450 unit bolus Mircera 100 mcg IV q 2 weeks- last 12/14/2018 Calcitriol 75mcg PO TIW Venofer 100mg  IV q HD(last dose 8/4) Tums 400mg  2QAC BID, 1 c snack, 4 QHS Auryxia 210mg  2tabs AC  Assessment/Plan: 1. Hematuria/Anemia:Urology following. CT showed massively dilated bladder with huge mass or clot within the bladder, sclerotic lesions in the vertebral bodies consistent with metastatic disease. PSA ~80. FOBT negative, hematuria as above.SP cystoscopy by urology on 8/7 which showed large hematoma which was completely evacuated and no masses seen post-evacuation. Pt Given Aranesp 200 mcg 01/08/2019.  Received 2 units PRBC yest, 1 uPRBC today- has received 8 total to date.Follow HGB.  Hold on further ESA in setting of concern for malignancy but will review risk:benefit with urology.  2. Hydronephrosis:CT revealed slight prominence of renal collecting systems felt to be due to compression of the ureters by bladder distention.  Renal US repeated yesterday PM - still with mild R hydro, large bladder mass - mass v hemorrhage.  Defer to urology re: management of persistent hydro.    3. ESRD:TTS-next dialysis 01/11/2019. No heparin with dialysis. 4.  Hypertension/volume:Does not appear volume overloaded on examor CXR.  Will need lower EDW at discharge.  5. Metabolic bone disease:Calcium 7.7. Was running in the 6's at outpatient unit and was taking Tums 4 QHS per most recent note. Continue high Ca bath. Continue auryxia. 6. Nutrition:Renal diet with fluid restrictions  Jannifer Hick MD [facility] Pager [phone]

## 2019-01-10 NOTE — Progress Notes (Signed)
Urology Progress Note   63 y.o. male with ESRD on dialysis who developed gross hematuria after foley placement.  Due to extensive clot burden taken for cystoscopy with clot EVAC under anesthesia on 8/7.  Intraoperatively 1.5 L of clot removed but no source of bleeding was found.  24 French three-way catheter was placed.    Interval 8/9: CBI on slow drip for the last 24 hours with no need for flushing due to catheter obstruction.  Urine fruit punch colored without clot this morning on slow drip CBI.  Hemoglobin has remained stable over the last 24 hours without need for further transfusion.    No plan for operating room today.  We will try to discontinue CBI so that the patient can go home with a catheter if his urine continues to drain without need for flushing    Objective: Vital signs in last 24 hours: Temp:  [98 F (36.7 C)-98.1 F (36.7 C)] 98 F (36.7 C) (08/10 0441) Pulse Rate:  [80-92] 82 (08/10 0441) Resp:  [18-20] 20 (08/10 0441) BP: (136-155)/(58-89) 147/76 (08/10 0441) SpO2:  [94 %-100 %] 98 % (08/10 0441) Weight:  [134.3 kg] 134.3 kg (08/09 2027)  Intake/Output from previous day: 08/09 0701 - 08/10 0700 In: 6420 [P.O.:920] Out: 85462 [Urine:14275] Intake/Output this shift: No intake/output data recorded.  Physical Exam:  General: Obese male, uncomfortable but not in distress GU: 24 fr foley, 30cc in balloon, CBI slow, fruit punch, no clots  Lab Results: Recent Labs    01/09/19 0530 01/09/19 2017 01/10/19 0440  HGB 6.9* 7.4* 7.2*  HCT 21.1* 22.3* 22.3*   BMET Recent Labs    01/09/19 0530 01/10/19 0440  NA 136 134*  K 4.7 4.0  CL 98 98  CO2 24 22  GLUCOSE 141* 120*  BUN 47* 60*  CREATININE 6.68* 8.41*  CALCIUM 7.7* 6.8*     Studies/Results: US Renal  Result Date: 01/09/2019 CLINICAL DATA:  Initial evaluation for hydronephrosis. EXAM: RENAL / URINARY TRACT ULTRASOUND COMPLETE COMPARISON:  Prior CT from 02/05/2019. FINDINGS: Right Kidney: Renal  measurements: 13.1 x 6.0 x 6.6 cm = volume: 272.3 mL. Increased echogenicity within the renal parenchyma. Underlying mild hydronephrosis. 2.4 x 2.2 x 2.7 cm exophytic cyst extends from the interpolar region. Left Kidney: Renal measurements: 16.3 x 6.6 x 5.3 cm = volume: 296.9 mL. Mildly increased echogenicity within the renal parenchyma, suggesting medical renal disease. No visible hydronephrosis. 1.7 x 2.0 x 1.5 cm simple exophytic cyst extends from the interpolar region. Bladder: Foley catheter partially visualized, tip projecting within an echogenic lesion. Large echogenic mass-like opacity measuring up to 11.9 cm, concerning for mass and/or hemorrhage, similar to prior CT. Overall bladder is moderately distended. IMPRESSION: 1. Persistent mild right-sided hydronephrosis. No significant left-sided obstructive uropathy. 2. Increased echogenicity within the renal parenchyma, suggesting a degree of underlying medical renal disease. 3. Large echogenic lesion measuring up to 11.9 cm within the bladder, which could reflect mass and/or hemorrhage. Foley catheter in place with tip projecting within this lesion. Persistent moderate distension of the urinary bladder. Bedside check to ensure Foley catheter is properly functioning suggested. 4. Simple bilateral renal cysts as above. Electronically Signed   By: Jeannine Boga M.D.   On: 01/09/2019 19:51   Assessment/Plan:  63 y.o. male with large clot burden in bladder despite maximum size hematuria catheter.   -Discontinue CBI inflow -Keep catheter drainage -Flush catheter as needed.  Very beneficial teach the patient how to flush the catheter on his own  should become obstructed at home -No OR today, okay for renal diet - Follow-up has already been  requested at [facility] urology for trial of void as well as discussion of elevated PSA and possible prostate biopsy given sclerotic lesions found on CT     LOS: 4 days   Tharon Aquas 01/10/2019, 7:22 AM

## 2019-01-10 NOTE — Plan of Care (Signed)
  Problem: Education: Goal: Knowledge of General Education information will improve Description Including pain rating scale, medication(s)/side effects and non-pharmacologic comfort measures Outcome: Progressing   

## 2019-01-11 ENCOUNTER — Inpatient Hospital Stay (HOSPITAL_COMMUNITY): Payer: Medicare Other

## 2019-01-11 ENCOUNTER — Encounter (HOSPITAL_COMMUNITY)
Admission: EM | Disposition: A | Discharge status: Home Health | Payer: Self-pay | Source: Home / Self Care | Attending: Internal Medicine

## 2019-01-11 LAB — TYPE AND SCREEN
ABO/RH(D): A POS
ABO/RH(D): A POS
Antibody Screen: NEGATIVE
Antibody Screen: NEGATIVE
Unit division: 0
Unit division: 0
Unit division: 0
Unit division: 0
Unit division: 0

## 2019-01-11 LAB — URINE CULTURE: Culture: NO GROWTH

## 2019-01-11 LAB — BASIC METABOLIC PANEL
Anion gap: 16 — ABNORMAL HIGH (ref 5–15)
BUN: 64 mg/dL — ABNORMAL HIGH (ref 8–23)
CO2: 21 mmol/L — ABNORMAL LOW (ref 22–32)
Calcium: 7.1 mg/dL — ABNORMAL LOW (ref 8.9–10.3)
Chloride: 102 mmol/L (ref 98–111)
Creatinine, Ser: 8.84 mg/dL — ABNORMAL HIGH (ref 0.61–1.24)
GFR calc Af Amer: 7 mL/min — ABNORMAL LOW (ref >60)
GFR calc non Af Amer: 6 mL/min — ABNORMAL LOW (ref >60)
Glucose, Bld: 108 mg/dL — ABNORMAL HIGH (ref 70–99)
Potassium: 3.6 mmol/L (ref 3.5–5.1)
Sodium: 139 mmol/L (ref 135–145)

## 2019-01-11 LAB — CBC WITH DIFFERENTIAL/PLATELET
Abs Immature Granulocytes: 0.4 10*3/uL — ABNORMAL HIGH (ref 0.00–0.07)
Basophils Absolute: 0 10*3/uL (ref 0.0–0.1)
Basophils Relative: 0 %
Eosinophils Absolute: 0.2 10*3/uL (ref 0.0–0.5)
Eosinophils Relative: 2 %
HCT: 25.4 % — ABNORMAL LOW (ref 39.0–52.0)
Hemoglobin: 8.2 g/dL — ABNORMAL LOW (ref 13.0–17.0)
Immature Granulocytes: 4 %
Lymphocytes Relative: 15 %
Lymphs Abs: 1.6 10*3/uL (ref 0.7–4.0)
MCH: 31.5 pg (ref 26.0–34.0)
MCHC: 32.3 g/dL (ref 30.0–36.0)
MCV: 97.7 fL (ref 80.0–100.0)
Monocytes Absolute: 0.9 10*3/uL (ref 0.1–1.0)
Monocytes Relative: 8 %
Neutro Abs: 7.7 10*3/uL (ref 1.7–7.7)
Neutrophils Relative %: 71 %
Platelets: 271 10*3/uL (ref 150–400)
RBC: 2.6 MIL/uL — ABNORMAL LOW (ref 4.22–5.81)
RDW: 16.8 % — ABNORMAL HIGH (ref 11.5–15.5)
WBC: 10.9 10*3/uL — ABNORMAL HIGH (ref 4.0–10.5)
nRBC: 2.7 % — ABNORMAL HIGH (ref 0.0–0.2)

## 2019-01-11 LAB — BPAM RBC
Blood Product Expiration Date: 202008222359
Blood Product Expiration Date: 202008272359
Blood Product Expiration Date: 202008292359
Blood Product Expiration Date: 202009042359
Blood Product Expiration Date: 202009042359
ISSUE DATE / TIME: 202008081835
ISSUE DATE / TIME: 202008090045
ISSUE DATE / TIME: 202008090909
ISSUE DATE / TIME: 202008091248
ISSUE DATE / TIME: 202008100936
Unit Type and Rh: 6200
Unit Type and Rh: 6200
Unit Type and Rh: 6200
Unit Type and Rh: 6200
Unit Type and Rh: 6200

## 2019-01-11 LAB — PATHOLOGIST SMEAR REVIEW

## 2019-01-11 LAB — GLUCOSE, CAPILLARY
Glucose-Capillary: 99 mg/dL (ref 70–99)
Glucose-Capillary: 133 mg/dL — ABNORMAL HIGH (ref 70–99)
Glucose-Capillary: 217 mg/dL — ABNORMAL HIGH (ref 70–99)
Glucose-Capillary: 168 mg/dL — ABNORMAL HIGH (ref 70–99)

## 2019-01-11 SURGERY — CYSTOSCOPY, WITH BLADDER FULGURATION
Anesthesia: General

## 2019-01-11 MED ORDER — NIFEDIPINE ER OSMOTIC RELEASE 90 MG PO TB24
90.0000 mg | ORAL_TABLET | Freq: Every day | ORAL | Status: DC
  Administered 2019-01-12: 90 mg via ORAL
  Filled 2019-01-11: qty 1

## 2019-01-11 MED ORDER — METOPROLOL TARTRATE 25 MG PO TABS
25.0000 mg | ORAL_TABLET | Freq: Every day | ORAL | Status: DC
  Administered 2019-01-11 – 2019-01-12 (×2): 25 mg via ORAL
  Filled 2019-01-11 (×2): qty 1

## 2019-01-11 MED ORDER — CALCITRIOL 0.5 MCG PO CAPS
ORAL_CAPSULE | ORAL | Status: AC
  Filled 2019-01-11: qty 2

## 2019-01-11 MED ORDER — DILTIAZEM HCL 25 MG/5ML IV SOLN
10.0000 mg | Freq: Once | INTRAVENOUS | Status: AC
  Administered 2019-01-11: 10 mg via INTRAVENOUS
  Filled 2019-01-11 (×2): qty 5

## 2019-01-11 MED ORDER — HEPARIN SODIUM (PORCINE) 1000 UNIT/ML DIALYSIS: 1000.0000 [IU] | INTRAMUSCULAR | Status: DC | PRN

## 2019-01-11 MED ORDER — LIDOCAINE HCL (PF) 1 % IJ SOLN: 5.0000 mL | INTRAMUSCULAR | Status: DC | PRN

## 2019-01-11 MED ORDER — SODIUM CHLORIDE 0.9 % IV SOLN: 100.0000 mL | INTRAVENOUS | Status: DC | PRN

## 2019-01-11 MED ORDER — CALCIUM CARBONATE ANTACID 500 MG PO CHEW
800.0000 mg | CHEWABLE_TABLET | Freq: Two times a day (BID) | ORAL | Status: DC
  Administered 2019-01-11 – 2019-01-12 (×3): 800 mg via ORAL
  Filled 2019-01-11 (×3): qty 4

## 2019-01-11 MED ORDER — PENTAFLUOROPROP-TETRAFLUOROETH EX AERO: 1.0000 "application " | INHALATION_SPRAY | CUTANEOUS | Status: DC | PRN

## 2019-01-11 MED ORDER — LIDOCAINE-PRILOCAINE 2.5-2.5 % EX CREA: 1.0000 "application " | TOPICAL_CREAM | CUTANEOUS | Status: DC | PRN

## 2019-01-11 MED ORDER — ALTEPLASE 2 MG IJ SOLR: 2.0000 mg | Freq: Once | INTRAMUSCULAR | Status: DC | PRN

## 2019-01-11 NOTE — Plan of Care (Signed)
  Problem: Education: Goal: Knowledge of General Education information will improve Description Including pain rating scale, medication(s)/side effects and non-pharmacologic comfort measures Outcome: Progressing   

## 2019-01-11 NOTE — Progress Notes (Addendum)
Urology Progress Note:  CBI had run out around 5:00AM this morning. I evaluated the patient at 6:30 and the urine was light pink in tubing. Flushed with 500cc of saline, virtually no clot.  Also, hemoglobin has remained stable, 8.2 from 8.5 over last 24hrs, did get 1 unit on 01/10/19  Plan: - Catheter on tension (rubber band to SCD)  - Dialysis this AM  - Ceftriaxone for abx  - Keep NPO  - Connect new bag of saline in case CBI is needed, but do NOT start inflow unless urine is dark wine colored.  - Pelvic US to evaluate for residual clot while in or immediatly after dialysis. The results of this ultrasound will determine if patient needs to go to the OR  Call [phone] with questions from 6:30AM to 5:00PM  -Tharon Aquas Urology Resident

## 2019-01-11 NOTE — Progress Notes (Signed)
Urology Progress Note:  Patient has made 1.6L of urine today with CBI off. Urine is fruit punch colored and thin as of 4:00PM today when we examined him. Still on traction.  Normal INR and APTT. Slight fibrinogen elevation. Urine culture No growth  Plan: - Continue antibiotics to decrease inflammation in bladder. Keflex of Cftx ok - Clears at midnight (in case procedure needed tomorrow) - Keep catheter on traction tonight. Patient should keep right leg straight when possible - Tomorrow if urine looks ok and blood counts remain stable will take off of traction and observe to ensure catheter is flowing well.  Contact in PM: Urology consult pager Contact 7AM -5PM: [phone]

## 2019-01-11 NOTE — Progress Notes (Signed)
Physical Therapy Treatment Patient Details Name: Richard Hall MRN: [id number] DOB: [birth year] Today's Date: 01/11/2019    History of Present Illness Richard Hall is a 63 y.o. male with medical history significant of constipation; HLD; depression; DM;afib (patient denies, not on [facility]); hypothyroidism; OSA on CPAP; HTN; and ESRD on TTS HD since January presenting with dizziness.  He went to [facility] yesterday for urinary retention.  They put a foley in and his urine has been increasingly bloody.    PT Comments    Patient ambulating short distance without difficulty, use of RW for safety. HR 95 with light acitivty. Cont to rec HHPT.   Follow Up Recommendations  Home health PT;Supervision for mobility/OOB(pending progress- otherwise SNF)     Equipment Recommendations       Recommendations for Other Services OT consult     Precautions / Restrictions Precautions Precautions: Fall Precaution Comments: watch Hgb Restrictions Weight Bearing Restrictions: No    Mobility  Bed Mobility Overal bed mobility: Modified Independent                Transfers Overall transfer level: Modified independent               General transfer comment: (min A at times for stability)  Ambulation/Gait Ambulation/Gait assistance: Modified independent (Device/Increase time) Gait Distance (Feet): 30 Feet Assistive device: Rolling walker (2 wheeled) Gait Pattern/deviations: Step-to pattern Gait velocity: decreassed   General Gait Details: pt with pain standing from cath, no overt LOB with RW aroud room HR 95   Stairs             Wheelchair Mobility    Modified Rankin (Stroke Patients Only)       Balance Overall balance assessment: Needs assistance   Sitting balance-Leahy Scale: Fair       Standing balance-Leahy Scale: Fair                              Cognition Arousal/Alertness: Awake/alert Behavior During Therapy: WFL for tasks assessed/performed                                          Exercises      General Comments        Pertinent Vitals/Pain      Home Living                      Prior Function            PT Goals (current goals can now be found in the care plan section) Acute Rehab PT Goals PT Goal Formulation: With patient Time For Goal Achievement: 01/23/19 Potential to Achieve Goals: Good Progress towards PT goals: Progressing toward goals    Frequency    Min 3X/week      PT Plan      Co-evaluation              AM-PAC PT "6 Clicks" Mobility   Outcome Measure  Help needed turning from your back to your side while in a flat bed without using bedrails?: None Help needed moving from lying on your back to sitting on the side of a flat bed without using bedrails?: None Help needed moving to and from a bed to a chair (including a wheelchair)?: A Little Help needed standing up from a chair using your  arms (e.g., wheelchair or bedside chair)?: A Little Help needed to walk in hospital room?: A Lot Help needed climbing 3-5 steps with a railing? : A Lot 6 Click Score: 18    End of Session Equipment Utilized During Treatment: Gait belt Activity Tolerance: Patient tolerated treatment well Patient left: in bed Nurse Communication: Mobility status PT Visit Diagnosis: Unsteadiness on feet (R26.81)     Time: 2122-4825 PT Time Calculation (min) (ACUTE ONLY): 15 min  Charges:  $Gait Training: 8-22 mins                    Richard Hall, PT, DPT Acute Rehabilitation Services Pager: [phone] Office: [place] 01/11/2019, 5:22 PM

## 2019-01-11 NOTE — Progress Notes (Signed)
Urology Progress Note:  Bladder ultrasound on 8/11 miraculously shows resolution of 12cm clot seen on last [place]. Patient has been OFF CBI and urine according to nurse as of 9:30AM is light pink lemonade color. No flushing required.  At this point we should CANCEL OR as it is not indicated with stable Hgb, no clot, improving hematuria.  [facility], nursing updated. Will send message to Hospitalist as well.   Plan: - [place] to eat 8/11 - NPO with clear midnight   Contact 7AM -5PM: [phone]

## 2019-01-11 NOTE — Progress Notes (Addendum)
PROGRESS NOTE  Richard Hall MOQ:[id number] DOB: 01-06-1956 DOA: 01/05/2019 PCP: [name], MD  HPI/Recap of past 24 hours: HPI from Dr [name] is a 63 y.o. male with medical history significant of constipation; HLD; depression; DM;afib (patient denies, not on [facility]); hypothyroidism; OSA on CPAP; HTN; and ESRD on TTS HD since January presenting with dizziness.  He went to [facility] yesterday for urinary retention.  They put a foley in and his urine has been increasingly bloody.  He became dizzy with standing and walking.  He got so dizzy while walking that he collapsed to the floor.  He was too weak to walk but did not lose consciousness.  No SOB.  No fever.  He has not noticed any blood in his stools.  The urine is "all blood", clotting the bag.  He pees daily and was peeing less than usual yesterday. Pt was seen yesterday in the [facility] ER. A prior bladder scan on 7/20 showed 4129 cc of urine in his bladder.  Renal US from 7/30 showed moderate R and mild L hydronephrosis with evidence of chronic medical renal disease. Pt had normal EGD and colonoscopy and video capsule endoscopy showed 1 AVM.  Last visit was in 12/19. In the ED, pt hypotensive with EMS, received 500 cc IVF.  Mentating well despite low BP.  Hgb 6.8, looks pale; prior Hgb 10.  Stool dark black, received 1 unit emergency release blood.  Denies h/o GI bleed.  No NSAIDs.  Urology consulted.  Patient admitted for further management.   Today, patient denies any new complaints.   Assessment/Plan: Principal Problem:   Symptomatic anemia Active Problems:   OSA on CPAP   Hypothyroidism (acquired)   Essential hypertension   Dyslipidemia   Diabetes mellitus with end-stage renal disease (HCC)   Gross hematuria   Gastrointestinal hemorrhage with melena   Acute on chronic symptomatic blood loss anemia likely 2/2 gross hematuria s/p cystoscopy, clot evacuation on 01/07/2019 Foley currently draining light pink urine Baseline hemoglobin  around 10-11 So far has received 8 units of PRBC, last on 01/10/2019 UA was turbid and significantly red, UC with no growth Repeat UC pending, started IV ceftriaxone empirically due to frequent urologic instrumentation CT stone showed massively distended bladder with a huge mass or clot within the bladder, slight prominence of the renal collecting systems bilaterally including the ureters due to compression of the ureters by the markedly distended urinary bladder.  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 cancer Status post cystoscopy, clot evacuation on 01/07/2019 Repeat ultrasound renal on 01/09/19 showed persistent mild right-sided hydronephrosis, with large echogenic lesion measuring up to 11.9 cm within the bladder, could reflect mass or hemorrhage Another repeat renal ultrasound on 01/11/2019 showed no residual mass demonstrated within the bladder Monitor very closely, for signs of significant hematuria Daily CBC  Leukocytosis Afebrile Chest x-ray unremarkable UC no growth, repeat pending Daily CBC  Near syncope Likely secondary to acute on chronic blood loss as mentioned above CT head unremarkable Monitor closely  Unlikely GI bleeding Had extensive GI work-up in May and September 2019 FOBT repeated by GI negative Dark stools likely due to iron supplementation GI consulted, no further work-up from the standpoint  Paroxysmal A. Fib Patient had RVR on 01/11/19, after patient came from dialysis complaining of rectal pain from hemorrhoids We will give 1 dose of IV diltiazem 10 mg Restart home nifedipine, metoprolol Not on any home  AC, will not also be given due to significant bleeding this admission  ESRD on HD T/TH/S Nephrology on board  Diabetes mellitus type 2 A1c 5.9 SSI, Lantus, hypoglycemic protocol, Accu-Cheks  Hypertension Continue hydralazine, imdur  Hypothyroidism Continue Synthroid   Obesity Lifestyle modification advised        Malnutrition Type:      Malnutrition Characteristics:      Nutrition Interventions:       Estimated body mass index is 37.2 kg/m as calculated from the following:   Height as of this encounter: 6' (1.829 m).   Weight as of this encounter: 124.4 kg.     Code Status: Full  Family Communication: None at bedside  Disposition Plan: To be determined   Consultants:  Urology  Nephrology  GI  Procedures:  Cystoscopy, clot evacuation on 01/07/2019  Antimicrobials:  Ceftriaxone  DVT prophylaxis: SCDs   Objective: Vitals:   01/11/19 1000 01/11/19 1030 01/11/19 1045 01/11/19 1100  BP: 109/80 (!) 146/96 (!) 124/94 (!) 125/58  Pulse: 96 100 97 90  Resp:  14    Temp:      TempSrc:      SpO2:  100%    Weight:      Height:        Intake/Output Summary (Last 24 hours) at 01/11/2019 1135 Last data filed at 01/11/2019 0900 Gross per 24 hour  Intake 6722.62 ml  Output 9220 ml  Net -2497.38 ml   Filed Weights   01/09/19 2027 01/10/19 2120 01/11/19 0720  Weight: 134.3 kg 130.3 kg 124.4 kg    Exam:  General: NAD, obese  Cardiovascular: S1, S2 present  Respiratory: CTAB  Abdomen: Soft, nontender, nondistended, bowel sounds present  Musculoskeletal: No bilateral pedal edema noted  Skin: Normal  Psychiatry: Normal mood    Data Reviewed: CBC: Recent Labs  Lab 01/05/19 1340  01/07/19 0327  01/08/19 1325 01/09/19 0530 01/09/19 2017 01/10/19 0440 01/10/19 1505 01/11/19 0527  WBC 16.6*   < > 16.5*  --  15.4* 12.6*  --  11.9* 12.9* 10.9*  NEUTROABS 14.4*  --  13.8*  --   --  9.8*  --  8.8*  --  7.7  HGB 6.8*   < > 7.6*   < > 6.0* 6.9* 7.4* 7.2* 8.5* 8.2*  HCT 22.3*   < > 23.6*   < > 18.9* 21.1* 22.3* 22.3* 25.6* 25.4*  MCV 101.8*   < > 96.3  --  97.4 98.1  --  97.4 95.9 97.7  PLT 223   < > 213  --  215 230  --  232 255 271   < > = values in this interval not displayed.   Basic Metabolic  Panel: Recent Labs  Lab 01/08/19 1325 01/09/19 0530 01/10/19 0440 01/10/19 1505 01/11/19 0527  NA 131* 136 134* 136 139  K 5.3* 4.7 4.0 4.0 3.6  CL 95* 98 98 97* 102  CO2 20* 24 22 25  21*  GLUCOSE 233* 141* 120* 140* 108*  BUN 68* 47* 60* 64* 64*  CREATININE 9.10* 6.68* 8.41* 8.61* 8.84*  CALCIUM 6.6* 7.7* 6.8* 7.2* 7.1*  PHOS 9.8*  --   --  5.1*  --    GFR: Estimated Creatinine Clearance: 11.7 mL/min (A) (by C-G formula based on SCr of 8.84 mg/dL (H)). Liver Function Tests: Recent Labs  Lab 01/05/19 1340 01/08/19 1325 01/10/19 1505  AST 25  --   --   ALT <5  --   --  ALKPHOS 114  --   --   BILITOT 1.1  --   --   PROT 5.9*  --   --   ALBUMIN 2.7* 2.5* 2.5*   No results for input(s): LIPASE, AMYLASE in the last 168 hours. No results for input(s): AMMONIA in the last 168 hours. Coagulation Profile: Recent Labs  Lab 01/10/19 1856  INR 1.2   Cardiac Enzymes: No results for input(s): CKTOTAL, CKMB, CKMBINDEX, TROPONINI in the last 168 hours. BNP (last 3 results) No results for input(s): PROBNP in the last 8760 hours. HbA1C: No results for input(s): HGBA1C in the last 72 hours. CBG: Recent Labs  Lab 01/10/19 0657 01/10/19 1110 01/10/19 1629 01/10/19 2121 01/11/19 0651  GLUCAP 130* 239* 129* 200* 99   Lipid Profile: No results for input(s): CHOL, HDL, LDLCALC, TRIG, CHOLHDL, LDLDIRECT in the last 72 hours. Thyroid Function Tests: No results for input(s): TSH, T4TOTAL, FREET4, T3FREE, THYROIDAB in the last 72 hours. Anemia Panel: No results for input(s): VITAMINB12, FOLATE, FERRITIN, TIBC, IRON, RETICCTPCT in the last 72 hours. Urine analysis:    Component Value Date/Time   COLORURINE RED (A) 01/06/2019 1118   APPEARANCEUR TURBID (A) 01/06/2019 1118   LABSPEC  01/06/2019 1118    TEST NOT REPORTED DUE TO COLOR INTERFERENCE OF URINE PIGMENT   PHURINE  01/06/2019 1118    TEST NOT REPORTED DUE TO COLOR INTERFERENCE OF URINE PIGMENT   GLUCOSEU (A)  01/06/2019 1118    TEST NOT REPORTED DUE TO COLOR INTERFERENCE OF URINE PIGMENT   HGBUR (A) 01/06/2019 1118    TEST NOT REPORTED DUE TO COLOR INTERFERENCE OF URINE PIGMENT   BILIRUBINUR (A) 01/06/2019 1118    TEST NOT REPORTED DUE TO COLOR INTERFERENCE OF URINE PIGMENT   KETONESUR (A) 01/06/2019 1118    TEST NOT REPORTED DUE TO COLOR INTERFERENCE OF URINE PIGMENT   PROTEINUR (A) 01/06/2019 1118    TEST NOT REPORTED DUE TO COLOR INTERFERENCE OF URINE PIGMENT   NITRITE (A) 01/06/2019 1118    TEST NOT REPORTED DUE TO COLOR INTERFERENCE OF URINE PIGMENT   LEUKOCYTESUR (A) 01/06/2019 1118    TEST NOT REPORTED DUE TO COLOR INTERFERENCE OF URINE PIGMENT   Sepsis Labs: @LABRCNTIP (procalcitonin:4,lacticidven:4)  ) Recent Results (from the past 240 hour(s))  SARS Coronavirus 2 [facility] order, Performed in [facility] hospital lab) Nasopharyngeal Nasopharyngeal Swab     Status: None   Collection Time: 01/05/19  5:50 PM   Specimen: Nasopharyngeal Swab  Result Value Ref Range Status   SARS Coronavirus 2 NEGATIVE NEGATIVE Final    Comment: (NOTE) If result is NEGATIVE SARS-CoV-2 target nucleic acids are NOT DETECTED. The SARS-CoV-2 RNA is generally detectable in upper and lower  respiratory specimens during the acute phase of infection. The lowest  concentration of SARS-CoV-2 viral copies this assay can detect is 250  copies / mL. A negative result does not preclude SARS-CoV-2 infection  and should not be used as the sole basis for treatment or other  patient management decisions.  A negative result may occur with  improper specimen collection / handling, submission of specimen other  than nasopharyngeal swab, presence of viral mutation(s) within the  areas targeted by this assay, and inadequate number of viral copies  (<250 copies / mL). A negative result must be combined with clinical  observations, patient history, and epidemiological information. If result is POSITIVE SARS-CoV-2 target  nucleic acids are DETECTED. The SARS-CoV-2 RNA is generally detectable in upper and lower  respiratory specimens  dur ing the acute phase of infection.  Positive  results are indicative of active infection with SARS-CoV-2.  Clinical  correlation with patient history and other diagnostic information is  necessary to determine patient infection status.  Positive results do  not rule out bacterial infection or co-infection with other viruses. If result is PRESUMPTIVE POSTIVE SARS-CoV-2 nucleic acids MAY BE PRESENT.   A presumptive positive result was obtained on the submitted specimen  and confirmed on repeat testing.  While 2019 novel coronavirus  (SARS-CoV-2) nucleic acids may be present in the submitted sample  additional confirmatory testing may be necessary for epidemiological  and / or clinical management purposes  to differentiate between  SARS-CoV-2 and other Sarbecovirus currently known to infect humans.  If clinically indicated additional testing with an alternate test  methodology [phone]) is advised. The SARS-CoV-2 RNA is generally  detectable in upper and lower respiratory sp ecimens during the acute  phase of infection. The expected result is Negative. Fact Sheet for Patients:  StrictlyIdeas.no Fact Sheet for Healthcare Providers: BankingDealers.co.za This test is not yet approved or cleared by the [place] FDA and has been authorized for detection and/or diagnosis of SARS-CoV-2 by FDA under an Emergency Use Authorization (EUA).  This EUA will remain in effect (meaning this test can be used) for the duration of the COVID-19 declaration under Section 564(b)(1) of the Act, 21 U.S.C. section 360bbb-3(b)(1), unless the authorization is terminated or revoked sooner. Performed at [facility], [place] [address] [place], [place] [address]   MRSA PCR Screening     Status: None   Collection Time: 01/05/19 10:48 PM    Specimen: Nasopharyngeal  Result Value Ref Range Status   MRSA by PCR NEGATIVE NEGATIVE Final    Comment:        The GeneXpert MRSA Assay (FDA approved for NASAL specimens only), is one component of a comprehensive MRSA colonization surveillance program. It is not intended to diagnose MRSA infection nor to guide or monitor treatment for MRSA infections. Performed at [facility], [place] [address] [place], [place] [address]   Urine culture     Status: None   Collection Time: 01/06/19 11:34 AM   Specimen: Urine, Random  Result Value Ref Range Status   Specimen Description URINE, RANDOM  Final   Special Requests NONE  Final   Culture   Final    NO GROWTH Performed at [facility], [place] [address], [place], [place] [address]    Report Status 01/07/2019 FINAL  Final      Studies: US Pelvis Limited (transabdominal Only)  Result Date: 01/11/2019 CLINICAL DATA:  Inpatient. Recent TURBT. Status post manual evacuation and three-way bladder irrigation for hematuria with large bladder clots. EXAM: LIMITED ULTRASOUND OF PELVIS TECHNIQUE: Limited transabdominal ultrasound examination of the pelvis was performed. COMPARISON:  01/09/2019 renal sonogram. FINDINGS: Bladder is completely decompressed by indwelling Foley catheter, limiting evaluation. No residual mass is demonstrated within the bladder. IMPRESSION: Bladder completely decompressed by indwelling Foley catheter, limiting evaluation. No residual mass demonstrated within the bladder. These results were called by telephone at the time of interpretation on 01/11/2019 at 9:18 am to Dr. Anthony Sar, who verbally acknowledged these results. Electronically Signed   By: Ilona Sorrel M.D.   On: 01/11/2019 09:20    Scheduled Meds: . acetaminophen  650 mg Oral Once  . amitriptyline  100 mg Oral QHS  . atorvastatin  40 mg Oral Daily  . calcitRIOL  1 mcg Oral  Q T,Th,Sa-HD  . calcium carbonate  800 mg of elemental calcium Oral BID  .  Chlorhexidine Gluconate Cloth  6 each Topical Q0600  . DULoxetine  30 mg Oral Daily  . ferric citrate  420 mg Oral TID WC  . hydrALAZINE  50 mg Oral TID  . hydrocortisone   Topical BID  . insulin aspart  0-5 Units Subcutaneous QHS  . insulin aspart  0-9 Units Subcutaneous TID WC  . insulin glargine  20 Units Subcutaneous Q2200  . isosorbide mononitrate  60 mg Oral Daily  . levothyroxine  50 mcg Oral Q0600  . sodium chloride flush  3 mL Intravenous Q12H    Continuous Infusions: . sodium chloride Stopped (01/10/19 1823)  . sodium chloride    . sodium chloride    . cefTRIAXone (ROCEPHIN)  IV 1 g (01/10/19 1823)     LOS: 5 days     Alma Friendly, MD [facility]  If 7PM-7AM, please contact night-coverage [email or web address] 01/11/2019, 11:35 AM

## 2019-01-11 NOTE — Plan of Care (Signed)
  Problem: Health Behavior/Discharge Planning: Goal: Ability to manage health-related needs will improve Outcome: Progressing   Problem: Clinical Measurements: Goal: Diagnostic test results will improve Outcome: Progressing   Problem: Clinical Measurements: Goal: Cardiovascular complication will be avoided Outcome: Progressing   

## 2019-01-11 NOTE — Progress Notes (Signed)
[facility] Progress Note   Subjective:   No complaints this morning.  Urology obtaining pelvic US and based on results may need OR today.  NPO currently.  No orthopnea, edema.  AVF no issues.   Objective Vitals:   01/11/19 0440 01/11/19 0720 01/11/19 0726 01/11/19 0730  BP: (!) 158/87 (!) 171/84 (!) 141/82 138/82  Pulse: (!) 106 95 92 93  Resp: (!) 21 18  18   Temp: 97.9 F (36.6 C) 97.8 F (36.6 C)    TempSrc: Oral Oral    SpO2: 95% 97%    Weight:  124.4 kg    Height:       Physical Exam General: Well developed, obese male in NAD Heart: RRR, no murmurs, rubs or gallops Lungs: CTA bilaterally without wheezing, rhonchi or rales Abdomen: Soft, non-distended, non-tender, + BS GU: Foley cath draining pink urine without clots Extremities: No peripheral edema Dialysis Access: LUE AVF + thrill  Additional Objective Labs: Basic Metabolic Panel: Recent Labs  Lab 01/08/19 1325  01/10/19 0440 01/10/19 1505 01/11/19 0527  NA 131*   < > 134* 136 139  K 5.3*   < > 4.0 4.0 3.6  CL 95*   < > 98 97* 102  CO2 20*   < > 22 25 21*  GLUCOSE 233*   < > 120* 140* 108*  BUN 68*   < > 60* 64* 64*  CREATININE 9.10*   < > 8.41* 8.61* 8.84*  CALCIUM 6.6*   < > 6.8* 7.2* 7.1*  PHOS 9.8*  --   --  5.1*  --    < > = values in this interval not displayed.   Liver Function Tests: Recent Labs  Lab 01/05/19 1340 01/08/19 1325 01/10/19 1505  AST 25  --   --   ALT <5  --   --   ALKPHOS 114  --   --   BILITOT 1.1  --   --   PROT 5.9*  --   --   ALBUMIN 2.7* 2.5* 2.5*   CBC: Recent Labs  Lab 01/08/19 1325 01/09/19 0530  01/10/19 0440 01/10/19 1505 01/11/19 0527  WBC 15.4* 12.6*  --  11.9* 12.9* 10.9*  NEUTROABS  --  9.8*  --  8.8*  --  7.7  HGB 6.0* 6.9*   < > 7.2* 8.5* 8.2*  HCT 18.9* 21.1*   < > 22.3* 25.6* 25.4*  MCV 97.4 98.1  --  97.4 95.9 97.7  PLT 215 230  --  232 255 271   < > = values in this interval not displayed.   Blood Culture    Component Value  Date/Time   SDES URINE, RANDOM 01/06/2019 1134   SPECREQUEST NONE 01/06/2019 1134   CULT  01/06/2019 1134    NO GROWTH Performed at [facility], [place] [address] [place], [place] [address]    REPTSTATUS 01/07/2019 FINAL 01/06/2019 1134   CBG: Recent Labs  Lab 01/10/19 0657 01/10/19 1110 01/10/19 1629 01/10/19 2121 01/11/19 0651  GLUCAP 130* 239* 129* 200* 99   Medications: . sodium chloride Stopped (01/10/19 1823)  . cefTRIAXone (ROCEPHIN)  IV 1 g (01/10/19 1823)   . sodium chloride   Intravenous Once  . sodium chloride   Intravenous Once  . sodium chloride   Intravenous Once  . acetaminophen  650 mg Oral Once  . amitriptyline  100 mg Oral QHS  . atorvastatin  40 mg Oral Daily  . calcitRIOL  1 mcg  Oral Q T,Th,Sa-HD  . calcium carbonate  800 mg of elemental calcium Oral Daily  . Chlorhexidine Gluconate Cloth  6 each Topical Q0600  . Chlorhexidine Gluconate Cloth  6 each Topical Q0600  . DULoxetine  30 mg Oral Daily  . ferric citrate  420 mg Oral TID WC  . hydrALAZINE  50 mg Oral TID  . hydrocortisone   Topical BID  . insulin aspart  0-5 Units Subcutaneous QHS  . insulin aspart  0-9 Units Subcutaneous TID WC  . insulin glargine  20 Units Subcutaneous Q2200  . isosorbide mononitrate  60 mg Oral Daily  . levothyroxine  50 mcg Oral Q0600  . sodium chloride flush  3 mL Intravenous Q12H    Dialysis Orders: [facility] Time:4h 79min; 180NRe; BFR 400/DFR 700; EDW 136kg; 2K/3Ca; heparin 4450 unit bolus Mircera 100 mcg IV q 2 weeks- last 12/14/2018 Calcitriol 65mcg PO TIW Venofer 100mg  IV q HD(last dose 8/4) Tums 400mg  2QAC BID, 1 c snack, 4 QHS Auryxia 210mg  2tabs AC  Assessment/Plan: 1. Hematuria/Anemia:Urology following. CT showed massively dilated bladder with huge mass or clot within the bladder, sclerotic lesions in the vertebral bodies consistent with metastatic disease. PSA ~80. FOBT negative, hematuria as above.SP cystoscopy by  urology on 8/7 which showed large hematoma which was completely evacuated and no masses seen post-evacuation. Pt Given Aranesp 200 mcg 01/08/2019. Received 2 units PRBC yest, 1 uPRBC yesterday - has received 8 total to date.Follow HGB which was stable this morning.  Hold on further ESA in setting of concern for malignancy but will review risk:benefit with urology.  2. Hydronephrosis:CT revealed slight prominence of renal collecting systems felt to be due to compression of the ureters by bladder distention.  Renal US repeated yesterday PM - still with mild R hydro, large bladder mass - mass v hemorrhage.  Defer to urology re: management of persistent hydro.  May go to OR today pending repeat US today.   3. ESRD:TTS- HD this AM. No heparin with dialysis. 4.  Hypertension/volume:Does not appear volume overloaded on examor CXR.  Will need lower EDW at discharge.  5. Metabolic bone disease:Calcium 7.1. Was running in the 6's at outpatient unit and was taking Tums 4 QHS per most recent note. Continue high Ca bath and oral ca supplement to BID.  Continue auryxia. 6. Nutrition:Renal diet with fluid restrictions  Jannifer Hick MD [facility] Pager [phone]

## 2019-01-12 DIAGNOSIS — Z9989 Dependence on other enabling machines and devices: Secondary | ICD-10-CM

## 2019-01-12 DIAGNOSIS — K921 Melena: Secondary | ICD-10-CM

## 2019-01-12 DIAGNOSIS — G4733 Obstructive sleep apnea (adult) (pediatric): Secondary | ICD-10-CM

## 2019-01-12 LAB — CBC WITH DIFFERENTIAL/PLATELET
Abs Immature Granulocytes: 0.52 10*3/uL — ABNORMAL HIGH (ref 0.00–0.07)
Basophils Absolute: 0 10*3/uL (ref 0.0–0.1)
Basophils Relative: 0 %
Eosinophils Absolute: 0.2 10*3/uL (ref 0.0–0.5)
Eosinophils Relative: 1 %
HCT: 26.5 % — ABNORMAL LOW (ref 39.0–52.0)
Hemoglobin: 8.4 g/dL — ABNORMAL LOW (ref 13.0–17.0)
Immature Granulocytes: 4 %
Lymphocytes Relative: 12 %
Lymphs Abs: 1.6 10*3/uL (ref 0.7–4.0)
MCH: 31.6 pg (ref 26.0–34.0)
MCHC: 31.7 g/dL (ref 30.0–36.0)
MCV: 99.6 fL (ref 80.0–100.0)
Monocytes Absolute: 1.1 10*3/uL — ABNORMAL HIGH (ref 0.1–1.0)
Monocytes Relative: 8 %
Neutro Abs: 10.4 10*3/uL — ABNORMAL HIGH (ref 1.7–7.7)
Neutrophils Relative %: 75 %
Platelets: 307 10*3/uL (ref 150–400)
RBC: 2.66 MIL/uL — ABNORMAL LOW (ref 4.22–5.81)
RDW: 17.2 % — ABNORMAL HIGH (ref 11.5–15.5)
WBC: 13.9 10*3/uL — ABNORMAL HIGH (ref 4.0–10.5)
nRBC: 1.1 % — ABNORMAL HIGH (ref 0.0–0.2)

## 2019-01-12 LAB — BASIC METABOLIC PANEL
Anion gap: 13 (ref 5–15)
BUN: 35 mg/dL — ABNORMAL HIGH (ref 8–23)
CO2: 25 mmol/L (ref 22–32)
Calcium: 8 mg/dL — ABNORMAL LOW (ref 8.9–10.3)
Chloride: 97 mmol/L — ABNORMAL LOW (ref 98–111)
Creatinine, Ser: 6.38 mg/dL — ABNORMAL HIGH (ref 0.61–1.24)
GFR calc Af Amer: 10 mL/min — ABNORMAL LOW (ref >60)
GFR calc non Af Amer: 8 mL/min — ABNORMAL LOW (ref >60)
Glucose, Bld: 138 mg/dL — ABNORMAL HIGH (ref 70–99)
Potassium: 3.8 mmol/L (ref 3.5–5.1)
Sodium: 135 mmol/L (ref 135–145)

## 2019-01-12 LAB — VON WILLEBRAND PANEL
Coagulation Factor VIII: 373 % — ABNORMAL HIGH (ref 56–140)
Ristocetin Co-factor, Plasma: 563 % — ABNORMAL HIGH (ref 50–200)
Von Willebrand Antigen, Plasma: 458 % — ABNORMAL HIGH (ref 50–200)

## 2019-01-12 LAB — GLUCOSE, CAPILLARY
Glucose-Capillary: 132 mg/dL — ABNORMAL HIGH (ref 70–99)
Glucose-Capillary: 132 mg/dL — ABNORMAL HIGH (ref 70–99)

## 2019-01-12 LAB — COAG STUDIES INTERP REPORT

## 2019-01-12 MED ORDER — CEPHALEXIN 250 MG PO CAPS: 250.0000 mg | ORAL_CAPSULE | Freq: Four times a day (QID) | ORAL | Status: DC

## 2019-01-12 MED ORDER — CEPHALEXIN 250 MG PO CAPS: 250.0000 mg | ORAL_CAPSULE | Freq: Two times a day (BID) | ORAL | 0 refills | Status: AC

## 2019-01-12 MED ORDER — CEPHALEXIN 250 MG PO CAPS
250.0000 mg | ORAL_CAPSULE | Freq: Two times a day (BID) | ORAL | Status: DC
  Administered 2019-01-12: 250 mg via ORAL
  Filled 2019-01-12 (×2): qty 1

## 2019-01-12 NOTE — Progress Notes (Signed)
Renal Navigator notified OP HD clinic/[place] of plan for discharge today with [facility] services in order to provide continuity of care.  Alphonzo Cruise, [place] Renal Navigator [phone]

## 2019-01-12 NOTE — Progress Notes (Signed)
DISCHARGE NOTE HOME Kelton Bultman to be discharged home per MD order. Discussed prescriptions and follow up appointments with the patient. Prescriptions given to patient; medication list explained in detail.  Leg bag and urine collection bag provided to patient along with instructions on how to change over. Patient verbalized understanding.  Skin clean, dry and intact without evidence of skin break down, no evidence of skin tears noted. IV catheter discontinued intact. Site without signs and symptoms of complications. Dressing and pressure applied. Pt denies pain at the site currently. No complaints noted.  Patient free of lines, drains, and wounds.   An After Visit Summary (AVS) was printed and given to the patient. Patient escorted via wheelchair, and discharged home via private auto.  Stephan Minister, RN

## 2019-01-12 NOTE — Progress Notes (Signed)
Urology Progress Note   63 y.o. male with ESRD on dialysis who developed gross hematuria after foley placement.  Due to extensive clot burden taken for cystoscopy with clot EVAC under anesthesia on 8/7.  Intraoperatively 1.5 L of clot removed but no source of bleeding was found.  24 French three-way catheter was placed.    Interval 8/12: Initially had continued hematuria and clot postoperatively.  After irrigating the catheter and placing on traction this cleared by 01/11/2019 and by the morning of 01/12/2019 his urine was crystal clear off CBI for greater than 24 hours.  Hemoglobin remained stable  Objective: Vital signs in last 24 hours: Temp:  [97.7 F (36.5 C)-98.4 F (36.9 C)] 98.2 F (36.8 C) (08/12 0509) Pulse Rate:  [85-147] 85 (08/12 0509) Resp:  [14-20] 18 (08/12 0509) BP: (109-155)/(58-96) 121/67 (08/12 0509) SpO2:  [96 %-100 %] 96 % (08/12 0509) Weight:  [121.1 kg] 121.1 kg (08/11 2031)  Intake/Output from previous day: 08/11 0701 - 08/12 0700 In: 628.2 [P.O.:480; I.V.:48.2; IV Piggyback:100] Out: 9381 [Urine:2950] Intake/Output this shift: No intake/output data recorded.  Physical Exam:  General: Obese male, uncomfortable but not in distress GU: 24 fr foley, 30cc in balloon, OFF CBI, crystal clear yellow urine  Lab Results: Recent Labs    01/10/19 1505 01/11/19 0527 01/12/19 0424  HGB 8.5* 8.2* 8.4*  HCT 25.6* 25.4* 26.5*   BMET Recent Labs    01/11/19 0527 01/12/19 0424  NA 139 135  K 3.6 3.8  CL 102 97*  CO2 21* 25  GLUCOSE 108* 138*  BUN 64* 35*  CREATININE 8.84* 6.38*  CALCIUM 7.1* 8.0*     Studies/Results: US Pelvis Limited (transabdominal Only)  Result Date: 01/11/2019 CLINICAL DATA:  Inpatient. Recent TURBT. Status post manual evacuation and three-way bladder irrigation for hematuria with large bladder clots. EXAM: LIMITED ULTRASOUND OF PELVIS TECHNIQUE: Limited transabdominal ultrasound examination of the pelvis was performed. COMPARISON:   01/09/2019 renal sonogram. FINDINGS: Bladder is completely decompressed by indwelling Foley catheter, limiting evaluation. No residual mass is demonstrated within the bladder. IMPRESSION: Bladder completely decompressed by indwelling Foley catheter, limiting evaluation. No residual mass demonstrated within the bladder. These results were called by telephone at the time of interpretation on 01/11/2019 at 9:18 am to Dr. Anthony Sar, who verbally acknowledged these results. Electronically Signed   By: Ilona Sorrel M.D.   On: 01/11/2019 09:20   Assessment/Plan:  63 y.o. male with hematuria and urinary retention.  Hematuria resolved and making adequate urine despite being dialysis dependent.  60 [place] three-way with CBI until capped.  - Keep 24 French catheter to drainage, NO CBI needed  - Okay for discharge from urology perspective  -To decrease bladder wall inflammation recommend continuing antibiotics to complete 14-day course (despite negative culture)  -We will have the patient follow-up next week for Foley downsizing, teaching of self-catheterization as he is unlikely to pass a trial of void  -We will also address elevated PSA and sclerotic lesions on CT at this follow-up appointment  -Patient will be called with an appointment date and time.  Please contact 6-year [phone] with questions or concerns  -Tharon Aquas, urology resident   LOS: 6 days   Tharon Aquas 01/12/2019, 7:30 AM

## 2019-01-12 NOTE — Progress Notes (Signed)
[facility] Progress Note   Subjective:   Patient seen in room. Urine clear this AM, Hgb stable at 8.4. Denies SOB, dyspnea, CP, dizziness, abdominal pain, N/V/D, bladder pain, edema. He is hoping to discharge soon.  Objective Vitals:   01/11/19 1649 01/11/19 2031 01/12/19 0509 01/12/19 0850  BP: 128/63 116/69 121/67 (!) 151/77  Pulse: 90 93 85 83  Resp: 18 18 18 18   Temp: 98.3 F (36.8 C) 98.4 F (36.9 C) 98.2 F (36.8 C) 98 F (36.7 C)  TempSrc: Oral Oral Oral Oral  SpO2: 97% 98% 96% 96%  Weight:  121.1 kg    Height:       Physical Exam General:Well developed, obese male in NAD Heart:RRR, no murmurs, rubs or gallops Lungs:CTA bilaterally without wheezing, rhonchi or rales Abdomen:Soft, non-distended, non-tender, + BS GU: Foley cath clear Extremities:No peripheral edema Dialysis Access:LUE AVF + thrill  Additional Objective Labs: Basic Metabolic Panel: Recent Labs  Lab 01/08/19 1325  01/10/19 1505 01/11/19 0527 01/12/19 0424  NA 131*   < > 136 139 135  K 5.3*   < > 4.0 3.6 3.8  CL 95*   < > 97* 102 97*  CO2 20*   < > 25 21* 25  GLUCOSE 233*   < > 140* 108* 138*  BUN 68*   < > 64* 64* 35*  CREATININE 9.10*   < > 8.61* 8.84* 6.38*  CALCIUM 6.6*   < > 7.2* 7.1* 8.0*  PHOS 9.8*  --  5.1*  --   --    < > = values in this interval not displayed.   Liver Function Tests: Recent Labs  Lab 01/05/19 1340 01/08/19 1325 01/10/19 1505  AST 25  --   --   ALT <5  --   --   ALKPHOS 114  --   --   BILITOT 1.1  --   --   PROT 5.9*  --   --   ALBUMIN 2.7* 2.5* 2.5*   CBC: Recent Labs  Lab 01/09/19 0530  01/10/19 0440 01/10/19 1505 01/11/19 0527 01/12/19 0424  WBC 12.6*  --  11.9* 12.9* 10.9* 13.9*  NEUTROABS 9.8*  --  8.8*  --  7.7 10.4*  HGB 6.9*   < > 7.2* 8.5* 8.2* 8.4*  HCT 21.1*   < > 22.3* 25.6* 25.4* 26.5*  MCV 98.1  --  97.4 95.9 97.7 99.6  PLT 230  --  232 255 271 307   < > = values in this interval not displayed.   Blood  Culture    Component Value Date/Time   SDES URINE, CATHETERIZED 01/10/2019 1806   SPECREQUEST NONE 01/10/2019 1806   CULT  01/10/2019 1806    NO GROWTH Performed at [facility], [place] [address] [place], [place] [address]    REPTSTATUS 01/11/2019 FINAL 01/10/2019 1806    CBG: Recent Labs  Lab 01/11/19 0651 01/11/19 1200 01/11/19 1625 01/11/19 2033 01/12/19 0701  GLUCAP 99 133* 217* 168* 132*    Studies/Results: US Pelvis Limited (transabdominal Only)  Result Date: 01/11/2019 CLINICAL DATA:  Inpatient. Recent TURBT. Status post manual evacuation and three-way bladder irrigation for hematuria with large bladder clots. EXAM: LIMITED ULTRASOUND OF PELVIS TECHNIQUE: Limited transabdominal ultrasound examination of the pelvis was performed. COMPARISON:  01/09/2019 renal sonogram. FINDINGS: Bladder is completely decompressed by indwelling Foley catheter, limiting evaluation. No residual mass is demonstrated within the bladder. IMPRESSION: Bladder completely decompressed by indwelling Foley catheter, limiting evaluation. No  residual mass demonstrated within the bladder. These results were called by telephone at the time of interpretation on 01/11/2019 at 9:18 am to Dr. Anthony Sar, who verbally acknowledged these results. Electronically Signed   By: Ilona Sorrel M.D.   On: 01/11/2019 09:20   Medications: . sodium chloride Stopped (01/11/19 1837)  . cefTRIAXone (ROCEPHIN)  IV 1 g (01/11/19 1837)   . acetaminophen  650 mg Oral Once  . amitriptyline  100 mg Oral QHS  . atorvastatin  40 mg Oral Daily  . calcitRIOL  1 mcg Oral Q T,Th,Sa-HD  . calcium carbonate  800 mg of elemental calcium Oral BID  . Chlorhexidine Gluconate Cloth  6 each Topical Q0600  . DULoxetine  30 mg Oral Daily  . ferric citrate  420 mg Oral TID WC  . hydrALAZINE  50 mg Oral TID  . hydrocortisone   Topical BID  . insulin aspart  0-5 Units Subcutaneous QHS  . insulin aspart  0-9 Units Subcutaneous TID WC  . insulin  glargine  20 Units Subcutaneous Q2200  . isosorbide mononitrate  60 mg Oral Daily  . levothyroxine  50 mcg Oral Q0600  . metoprolol tartrate  25 mg Oral Daily  . NIFEdipine  90 mg Oral QAC breakfast  . sodium chloride flush  3 mL Intravenous Q12H    Dialysis Orders: [facility]. Time:4h 48min; 180NRe; BFR 400/DFR 700; EDW 136kg; 2K/3Ca; heparin 4450 unit bolus Mircera 100 mcg IV q 2 weeks- last 12/14/2018 Calcitriol 24mcg PO TIW Venofer 100mg  IV q HD(last dose 8/4) Tums 400mg  2QAC BID, 1 c snack, 4 QHS Auryxia 210mg  2tabs AC   Assessment/Plan: 1. Hematuria/Anemia:Urology following. CT showed massively dilated bladder with huge mass or clot within the bladder, sclerotic lesions in the vertebral bodies consistent with metastatic disease. PSA ~80. FOBT negative, hematuria as above.SP cystoscopy by urology on 8/7 which showed large hematoma which was completely evacuated and no masses seen post-evacuation. Pt GivenAranesp 200 mcg 01/08/2019. Received 8 units PRBC total to date.Follow HGB which was stable this morning.  Hold on further ESA in setting of concern for malignancy. Bladder ultrasound on 8/11 shows resolution of 12cm clot seen on last [place]. Per urology notes, ok to discharge with outpatient follow up. 2. Hydronephrosis:CT revealed slight prominence of renal collecting systems felt to be due to compression of the ureters by bladder distention.  Renal US repeated yesterday PM - still with mild R hydro, large bladder mass - mass v hemorrhage.  Defer to urology re: management of persistent hydro.  3. ESRD:TTS- next HD tomorrow if still admitted. May go to outpatient unit if discharged. No heparin.  4.  Hypertension/volume:Does not appear volume overloaded on examor CXR. Will need lower EDW at discharge. 5. Metabolic bone disease:Calcium 8.0. Was running in the 6's at outpatient unit and was taking Tums 4 QHS per most recent note. Continue high Ca bath and  oral ca supplement to BID.  Continue auryxia. 6. Nutrition:Renal diet with fluid restrictions  Anice Paganini, PA-C 01/12/2019, 9:11 AM  [facility] Pager: [phone]

## 2019-01-12 NOTE — TOC Initial Note (Signed)
Transition of Care [facility]) - Initial/Assessment Note    Patient Details  Name: Richard Hall MRN: [id number] Date of Birth: November 11, 1955  [facility] CM/SW Contact:    Carles Collet, RN Phone Number: 01/12/2019, 11:16 AM  Clinical Narrative:                [name] w patient at bedside. He plans to return home at discharge. He lives at home by himself. Discussed [place] providers, ratings. He would like to use [facility]. Referral accepted.   MD please place [facility] order for RN PT OT aid SW.  Thank you.    Expected Discharge Plan: [place] Barriers to Discharge: Continued Medical Work up   Patient Goals and CMS Choice Patient states their goals for this hospitalization and ongoing recovery are:: to return home CMS Medicare.gov Compare Post Acute Care list provided to:: Patient Choice offered to / list presented to : Patient  Expected Discharge Plan and Services Expected Discharge Plan: [place]   Discharge Planning Services: CM Consult Post Acute Care Choice: Home Health   Expected Discharge Date: 01/12/19                         HH Arranged: RN, PT, OT, Nurse's Aide, Social Work [employer] Agency: [place] Date [facility] Agency Contacted: 01/12/19 Time HH Agency Contacted: 34 Representative spoke with at [place]: cory  Prior Living Arrangements/Services   Lives with:: Self Patient language and need for interpreter reviewed:: Yes Do you feel safe going back to the place where you live?: Yes            Criminal Activity/Legal Involvement Pertinent to Current Situation/Hospitalization: No - Comment as needed  Activities of Daily Living Home Assistive Devices/Equipment: None ADL Screening (condition at time of admission) Patient's cognitive ability adequate to safely complete daily activities?: Yes Is the patient deaf or have difficulty hearing?: No Does the patient have difficulty seeing, even when wearing glasses/contacts?:  No Does the patient have difficulty concentrating, remembering, or making decisions?: No Patient able to express need for assistance with ADLs?: Yes Does the patient have difficulty dressing or bathing?: No Independently performs ADLs?: Yes (appropriate for developmental age) Does the patient have difficulty walking or climbing stairs?: No Weakness of Legs: Both Weakness of Arms/Hands: Both  Permission Sought/Granted                  Emotional Assessment              Admission diagnosis:  Acute blood loss anemia [D62] Dizziness [R42] Symptomatic anemia [D64.9] Closed fracture of left elbow, initial encounter [S42.402A] Patient Active Problem List   Diagnosis Date Noted  . Symptomatic anemia 01/05/2019  . Gross hematuria 01/05/2019  . Gastrointestinal hemorrhage with melena 01/05/2019  . OSA on CPAP   . Hypothyroidism (acquired)   . Essential hypertension   . Dyslipidemia   . Diabetes mellitus with end-stage renal disease ([place])   . Acute blood loss anemia    PCP:  [name], MD Pharmacy:   [facility] [address] [place] [address] Phone: [phone] Fax: [phone]     Social Determinants of Health (SDOH) Interventions    Readmission Risk Interventions No flowsheet data found.

## 2019-01-12 NOTE — Discharge Summary (Signed)
Physician Discharge Summary  Richard Hall DPO:[id number] DOB: 13-Jun-1955 DOA: 01/05/2019  PCP: [name], MD  Admit date: 01/05/2019 Discharge date: 01/12/2019  Admitted From: Home  Disposition:  Home  Recommendations for Outpatient Follow-up and new medication changes:  1. Follow up with Dr. [name] in 7 days.  2. Patient will keep 24 F catheter in place to drainage, follow up with urology next week for foley downsizing, teaching of self-catheterization. 3. Follow up on elevated PSA and sclerotic lesions on CT in the urology clinic.  4. Continue antibiotic therapy for 12 more days with Cephalexin.   Home Health:  Yes  Equipment/Devices: 82 F bladder catheter, walker.    Discharge Condition: stable CODE STATUS: full   Diet recommendation: renal and heart healthy   Brief/Interim Summary: 63 year old male who presented with dizziness.  He does have significant past medical history for dyslipidemia, type 2 diabetes mellitus, atrial fibrillation, hypothyroidism, hypertension, end-stage renal disease on hemodialysis, TTS, and obstructive sleep apnea.  One day prior to hospitalization, he was seen at [facility] emergency department with a complaint of both 6 days with difficulty urinating.  He was diagnosed with urinary retention and an indwelling Foley catheter was placed. He was discharged home.  At home patient reported persistent dizziness worse with exertion, associated with bloody urine with clots.  On his initial physical examination his blood pressure was 91/51, heart rate 78, respiratory rate 22, temperature 97.1, oxygen saturation 9%.  His lungs were clear to auscultation bilaterally, heart S1-S2 present and rhythmic, abdomen was soft, no lower extremity edema.  Sodium 133, potassium 4.9, chloride 98, bicarb 18, glucose 255, BUN 43, creatinine 6.9, white count 16.6, hemoglobin 6.8, hematocrit 22.3, platelets 223.  SARS COVID-19 was negative.  Urinalysis 11-20 white cells, more than  50 red cells.  Chest x-ray negative for infiltrates.  EKG 75 bpm, normal axis, normal intervals, sinus rhythm, no ST segment or T wave changes.  Patient was admitted to the medical telemetry ward with a working diagnosis of symptomatic anemia due to acute blood loss anemia due to hematuria.  Patient received packed red blood cell transfusion, he required 8 units, last given August 10.  Further work-up with CT showed massive distended bladder with a huge mass/clot within the bladder, slight prominence of the renal collecting system bilaterally including ureters.  Patient underwent cystoscopy August 2020 for clot evacuation.  1.  Acute on chronic symptomatic anemia due to acute blood loss secondary to hematuria, status post cystoscopy and clot evacuation 08/7/ 2020.  Patient was admitted to the medical telemetry ward, he received packed red blood cell transfusions with good toleration.  He did required 8 units PRBC.  His hemoglobin and hematocrit now have been stable at 8.4 with hematocrit 26.5.  Further work-up with CT renal study showed massively distende bladder with a huge mass or clot within the bladder.  Sclerotic lesions in the T11 T10 vertebral bodies consistent with possible metastatic disease.  Slight prominence of the renal collecting system bilaterally including the ureters and pelvis. Patient underwent cystoscopy, clot evacuation and Foley catheter placement August 7.  Patient was started on ceftriaxone, for antibiotic coverage, for acute cystitis.  Patient had serial imaging, with ultrasonography.  Last study from August 11 show no residual mass within the bladder.   2.  Paroxysmal atrial fibrillation with rapid ventricular response.  Patient responded well to AV blockade, patient will continue metoprolol at discharge.  Currently not on anticoagulation due to acute blood loss anemia.  3.  End-stage renal disease on hemodialysis.  Patient underwent dialysis with no major complications while  hospitalized.  4.  Type 2 diabetes mellitus.  Patient was placed on insulin sliding scale for glucose coverage and monitoring/along with basal insulin. His glucose remained stable. At home patient will resume glipizide.   5.  Hypertension.  Continue metoprolol, nifedipine, hydralazine and isosorbide.  6.  Hypothyroidism.  Continue levothyroxine.  7.  Obesity with dyslipidemia. Calculated BMI is 36,2. Continue atorvastatin and gemfibrozil.    Discharge Diagnoses:  Principal Problem:   Symptomatic anemia Active Problems:   OSA on CPAP   Hypothyroidism (acquired)   Essential hypertension   Dyslipidemia   Diabetes mellitus with end-stage renal disease (HCC)   Gross hematuria   Gastrointestinal hemorrhage with melena    Discharge Instructions   Allergies as of 01/12/2019      Reactions   Gabapentin Swelling      Medication List    STOP taking these medications   rosuvastatin 40 MG tablet Commonly known as: CRESTOR     TAKE these medications   amitriptyline 100 MG tablet Commonly known as: ELAVIL Take 100 mg by mouth at bedtime.   atorvastatin 40 MG tablet Commonly known as: LIPITOR Take 40 mg by mouth daily.   Auryxia 1 GM 210 MG(Fe) tablet Generic drug: ferric citrate Take 420 mg by mouth 3 (three) times daily with meals.   cephALEXin 250 MG capsule Commonly known as: KEFLEX Take 1 capsule (250 mg total) by mouth every 12 (twelve) hours for 12 days.   Darbepoetin Alfa 40 MCG/ML Soln Inject 1 mL into the skin every 7 (seven) days.   DULoxetine 30 MG capsule Commonly known as: CYMBALTA Take 30 mg by mouth daily.   furosemide 80 MG tablet Commonly known as: LASIX Take 80 mg by mouth 4 (four) times a week. Mondays, wednesdays, fridays, and sundays   gemfibrozil 600 MG tablet Commonly known as: LOPID Take 600 mg by mouth 2 (two) times daily.   glipiZIDE 10 MG tablet Commonly known as: GLUCOTROL Take 10 mg by mouth daily.   hydrALAZINE 100 MG  tablet Commonly known as: APRESOLINE Take 50 mg by mouth 3 (three) times daily.   isosorbide mononitrate 60 MG 24 hr tablet Commonly known as: IMDUR Take 60 mg by mouth daily.   Lantus SoloStar 100 UNIT/ML Solostar Pen Generic drug: Insulin Glargine Inject 40 Units into the skin daily as needed.   levothyroxine 50 MCG tablet Commonly known as: SYNTHROID Take 50 mcg by mouth daily.   lidocaine-prilocaine cream Commonly known as: EMLA Apply 1 application topically See admin instructions. Apply small amount to access site 1-2 hours before dialysis, cover with occlusive dressing (saran wrap)   Linzess 72 MCG capsule Generic drug: linaclotide Take 72 mcg by mouth daily.   metoprolol tartrate 25 MG tablet Commonly known as: LOPRESSOR Take 25 mg by mouth daily.   NIFEdipine 90 MG 24 hr tablet Commonly known as: ADALAT CC Take 90 mg by mouth daily before breakfast. 30 minutes before breakfast   TUMS PO Take 4 tablets by mouth at bedtime. chewables      Follow-up Information    Leandrew Koyanagi, MD Follow up.   Specialty: Orthopedic Surgery Contact information: [place] [address] [phone]          Allergies  Allergen Reactions  . Gabapentin Swelling    Consultations:  Urology  Nephrology   Procedures/Studies: Dg Chest 2 View  Result Date: 01/05/2019  CLINICAL DATA:  Weakness, pain, fall EXAM: CHEST - 2 VIEW COMPARISON:  Chest radiograph 08/30/2018, chest CT 09/23/2017 FINDINGS: Accounting for body habitus, the lungs are clear. No consolidation, features of edema, pneumothorax, or effusion. Pulmonary vascularity is normally distributed. The cardiomediastinal contours are stable from prior studies. No acute osseous or soft tissue abnormality. Degenerative changes are present in the and imaged spine and shoulders. IMPRESSION: No active cardiopulmonary disease or acute traumatic abnormality. Electronically Signed   By: Lovena Le  M.D.   On: 01/05/2019 14:33   Dg Elbow Complete Left  Result Date: 01/05/2019 CLINICAL DATA:  Fall, weakness, left elbow abrasion EXAM: LEFT ELBOW - COMPLETE 3+ VIEW COMPARISON:  None. FINDINGS: Small nondisplaced fracture of the coronoid process. Small left elbow joint effusion is present. There is circumferential soft tissue swelling of the elbow. No additional fracture or traumatic malalignment. Vascular calcium is seen in the soft tissues. IMPRESSION: Small nondisplaced fracture of the coronoid process. Electronically Signed   By: Lovena Le M.D.   On: 01/05/2019 14:37   Ct Head Wo Contrast  Result Date: 01/05/2019 CLINICAL DATA:  Syncope EXAM: CT HEAD WITHOUT CONTRAST TECHNIQUE: Contiguous axial images were obtained from the base of the skull through the vertex without intravenous contrast. COMPARISON:  CT head 08/20/2018 FINDINGS: Brain: Mild atrophy. Negative for acute infarct, hemorrhage, mass. No midline shift. Mild hypodensity in the periventricular white matter left frontal lobe. 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 Mild mucosal edema paranasal sinuses. Other: None IMPRESSION: No acute intracranial abnormality and no change from the prior study. Electronically Signed   By: Franchot Gallo M.D.   On: 01/05/2019 19:13   US Pelvis Limited (transabdominal Only)  Result Date: 01/11/2019 CLINICAL DATA:  Inpatient. Recent TURBT. Status post manual evacuation and three-way bladder irrigation for hematuria with large bladder clots. EXAM: LIMITED ULTRASOUND OF PELVIS TECHNIQUE: Limited transabdominal ultrasound examination of the pelvis was performed. COMPARISON:  01/09/2019 renal sonogram. FINDINGS: Bladder is completely decompressed by indwelling Foley catheter, limiting evaluation. No residual mass is demonstrated within the bladder. IMPRESSION: Bladder completely decompressed by indwelling Foley catheter, limiting  evaluation. No residual mass demonstrated within the bladder. These results were called by telephone at the time of interpretation on 01/11/2019 at 9:18 am to Dr. Anthony Sar, who verbally acknowledged these results. Electronically Signed   By: Ilona Sorrel M.D.   On: 01/11/2019 09:20   US Renal  Result Date: 01/09/2019 CLINICAL DATA:  Initial evaluation for hydronephrosis. EXAM: RENAL / URINARY TRACT ULTRASOUND COMPLETE COMPARISON:  Prior CT from 02/05/2019. FINDINGS: Right Kidney: Renal measurements: 13.1 x 6.0 x 6.6 cm = volume: 272.3 mL. Increased echogenicity within the renal parenchyma. Underlying mild hydronephrosis. 2.4 x 2.2 x 2.7 cm exophytic cyst extends from the interpolar region. Left Kidney: Renal measurements: 16.3 x 6.6 x 5.3 cm = volume: 296.9 mL. Mildly increased echogenicity within the renal parenchyma, suggesting medical renal disease. No visible hydronephrosis. 1.7 x 2.0 x 1.5 cm simple exophytic cyst extends from the interpolar region. Bladder: Foley catheter partially visualized, tip projecting within an echogenic lesion. Large echogenic mass-like opacity measuring up to 11.9 cm, concerning for mass and/or hemorrhage, similar to prior CT. Overall bladder is moderately distended. IMPRESSION: 1. Persistent mild right-sided hydronephrosis. No significant left-sided obstructive uropathy. 2. Increased echogenicity within the renal parenchyma, suggesting a degree of underlying medical renal disease. 3. Large echogenic lesion measuring up to 11.9 cm within the bladder, which could reflect  mass and/or hemorrhage. Foley catheter in place with tip projecting within this lesion. Persistent moderate distension of the urinary bladder. Bedside check to ensure Foley catheter is properly functioning suggested. 4. Simple bilateral renal cysts as above. Electronically Signed   By: Jeannine Boga M.D.   On: 01/09/2019 19:51   Dg Knee Complete 4 Views Left  Result Date: 01/05/2019 CLINICAL DATA:  Weakness,  pain, fall. Left knee pain. EXAM: LEFT KNEE - COMPLETE 4+ VIEW COMPARISON:  None. FINDINGS: Severe tricompartmental osteoarthrosis most pronounced in the medial femorotibial compartment where there is bone-on-bone contact though evaluation of the alignment is limited on nonweightbearing views. No acute fracture or traumatic malalignment. The lateral radiograph is suboptimally projected limiting evaluation for small effusion. No large effusion is seen. Minimal soft tissue thickening anterior to the knee. Vascular calcium seen posteriorly. IMPRESSION: Severe tricompartmental osteoarthrosis. No definite acute osseous abnormality. Electronically Signed   By: Lovena Le M.D.   On: 01/05/2019 14:35   Ct Renal Stone Study  Result Date: 01/05/2019 CLINICAL DATA:  Hematuria. EXAM: CT ABDOMEN AND PELVIS WITHOUT CONTRAST TECHNIQUE: Multidetector CT imaging of the abdomen and pelvis was performed following the standard protocol without IV contrast. COMPARISON:  None. FINDINGS: Lower chest: No acute abnormality. Coronary artery calcification. Heart size is normal. No pericardial effusion. Lung bases are clear. Hepatobiliary: There is a vague low-density lesion in the inferior aspect of the right lobe of the liver measuring approximately 2 cm in diameter. This correlates with the hyperechoic lesion on ultrasound and probably represents a benign hemangioma. Liver parenchyma is otherwise normal. Biliary tree is normal. Pancreas: Normal. Spleen: Normal. Adrenals/Urinary Tract: Adrenal glands are normal. 2.6 cm exophytic cyst on the lateral aspect of the mid left kidney. 3.2 cm exophytic cyst on the lower pole the right kidney. Slight prominence of the renal collecting systems bilaterally including dilatation of the ureters in the pelvis. The bladder is markedly distended with a huge mass or clot within the bladder. The mass measures 16 cm in diameter. Foley catheter is in place at the bladder base. Stomach/Bowel: The bowel is  normal. Appendix has been removed. Vascular/Lymphatic: Aortic atherosclerosis. No enlarged abdominal or pelvic lymph nodes. Reproductive: Prostate is unremarkable. Other: No abdominal wall hernia or abnormality. No abdominopelvic ascites. Musculoskeletal: The entire body of the T11 vertebra is sclerotic. There is also sclerosis of the anterior aspect of T10. There are several tiny sclerotic lesions in the pelvic bones. IMPRESSION: 1. Massively distended bladder with a huge mass or clot within the bladder. 2. Sclerotic lesions in the T11 and T10 vertebral bodies consistent with metastatic disease. Tiny sclerotic lesions in the pelvic bones may also represent metastatic disease. This is most commonly associated with prostate cancer. 3. Slight prominence of the renal collecting systems bilaterally including the ureters in the pelvis this is felt to be due to compression of the ureters by the markedly distended urinary bladder. Aortic Atherosclerosis (ICD10-I70.0). Electronically Signed   By: Lorriane Shire M.D.   On: 01/05/2019 19:22      Procedures: Cystoscopy with clot evacuation and bladder catheter placement.   Subjective: Patient is feeling better, no nausea or vomiting, no further hematuria, no dyspnea or dizziness, no chest pain. Patient is tolerating po well.   Discharge Exam: Vitals:   01/12/19 0509 01/12/19 0850  BP: 121/67 (!) 151/77  Pulse: 85 83  Resp: 18 18  Temp: 98.2 F (36.8 C) 98 F (36.7 C)  SpO2: 96% 96%   Vitals:   01/11/19  1649 01/11/19 2031 01/12/19 0509 01/12/19 0850  BP: 128/63 116/69 121/67 (!) 151/77  Pulse: 90 93 85 83  Resp: 18 18 18 18   Temp: 98.3 F (36.8 C) 98.4 F (36.9 C) 98.2 F (36.8 C) 98 F (36.7 C)  TempSrc: Oral Oral Oral Oral  SpO2: 97% 98% 96% 96%  Weight:  121.1 kg    Height:        General: not in pain or dyspnea.  Neurology: Awake and alert, non focal  E ENT: mild pallor, no icterus, oral mucosa moist Cardiovascular: No JVD. S1-S2  present, rhythmic, no gallops, rubs, or murmurs. No lower extremity edema. Pulmonary: positive breath sounds bilaterally, adequate air movement, no wheezing, rhonchi or rales. Gastrointestinal. Abdomen with no organomegaly, non tender, no rebound or guarding Skin. No rashes Musculoskeletal: no joint deformities Positive foley catheter. Left arm fistula.   The results of significant diagnostics from this hospitalization (including imaging, microbiology, ancillary and laboratory) are listed below for reference.     Microbiology: Recent Results (from the past 240 hour(s))  SARS Coronavirus 2 [facility] order, Performed in [facility] hospital lab) Nasopharyngeal Nasopharyngeal Swab     Status: None   Collection Time: 01/05/19  5:50 PM   Specimen: Nasopharyngeal Swab  Result Value Ref Range Status   SARS Coronavirus 2 NEGATIVE NEGATIVE Final    Comment: (NOTE) If result is NEGATIVE SARS-CoV-2 target nucleic acids are NOT DETECTED. The SARS-CoV-2 RNA is generally detectable in upper and lower  respiratory specimens during the acute phase of infection. The lowest  concentration of SARS-CoV-2 viral copies this assay can detect is 250  copies / mL. A negative result does not preclude SARS-CoV-2 infection  and should not be used as the sole basis for treatment or other  patient management decisions.  A negative result may occur with  improper specimen collection / handling, submission of specimen other  than nasopharyngeal swab, presence of viral mutation(s) within the  areas targeted by this assay, and inadequate number of viral copies  (<250 copies / mL). A negative result must be combined with clinical  observations, patient history, and epidemiological information. If result is POSITIVE SARS-CoV-2 target nucleic acids are DETECTED. The SARS-CoV-2 RNA is generally detectable in upper and lower  respiratory specimens dur ing the acute phase of infection.  Positive  results are indicative  of active infection with SARS-CoV-2.  Clinical  correlation with patient history and other diagnostic information is  necessary to determine patient infection status.  Positive results do  not rule out bacterial infection or co-infection with other viruses. If result is PRESUMPTIVE POSTIVE SARS-CoV-2 nucleic acids MAY BE PRESENT.   A presumptive positive result was obtained on the submitted specimen  and confirmed on repeat testing.  While 2019 novel coronavirus  (SARS-CoV-2) nucleic acids may be present in the submitted sample  additional confirmatory testing may be necessary for epidemiological  and / or clinical management purposes  to differentiate between  SARS-CoV-2 and other Sarbecovirus currently known to infect humans.  If clinically indicated additional testing with an alternate test  methodology [phone]) is advised. The SARS-CoV-2 RNA is generally  detectable in upper and lower respiratory sp ecimens during the acute  phase of infection. The expected result is Negative. Fact Sheet for Patients:  StrictlyIdeas.no Fact Sheet for Healthcare Providers: BankingDealers.co.za This test is not yet approved or cleared by the [place] FDA and has been authorized for detection and/or diagnosis of SARS-CoV-2 by FDA under an Emergency Use  Authorization (EUA).  This EUA will remain in effect (meaning this test can be used) for the duration of the COVID-19 declaration under Section 564(b)(1) of the Act, 21 U.S.C. section 360bbb-3(b)(1), unless the authorization is terminated or revoked sooner. Performed at [facility], [place] [address] [place], [place] [address]   MRSA PCR Screening     Status: None   Collection Time: 01/05/19 10:48 PM   Specimen: Nasopharyngeal  Result Value Ref Range Status   MRSA by PCR NEGATIVE NEGATIVE Final    Comment:        The GeneXpert MRSA Assay (FDA approved for NASAL specimens only), is one  component of a comprehensive MRSA colonization surveillance program. It is not intended to diagnose MRSA infection nor to guide or monitor treatment for MRSA infections. Performed at [facility], [place] [address], [place], [place] [address]   Urine culture     Status: None   Collection Time: 01/06/19 11:34 AM   Specimen: Urine, Random  Result Value Ref Range Status   Specimen Description URINE, RANDOM  Final   Special Requests NONE  Final   Culture   Final    NO GROWTH Performed at [facility], [place] [address] [place], [place] [address]    Report Status 01/07/2019 FINAL  Final  Culture, Urine     Status: None   Collection Time: 01/10/19  6:06 PM   Specimen: Urine, Catheterized  Result Value Ref Range Status   Specimen Description URINE, CATHETERIZED  Final   Special Requests NONE  Final   Culture   Final    NO GROWTH Performed at [facility], [address], [place], [place] [address]    Report Status 01/11/2019 FINAL  Final     Labs: BNP (last 3 results) No results for input(s): BNP in the last 8760 hours. Basic Metabolic Panel: Recent Labs  Lab 01/08/19 1325 01/09/19 0530 01/10/19 0440 01/10/19 1505 01/11/19 0527 01/12/19 0424  NA 131* 136 134* 136 139 135  K 5.3* 4.7 4.0 4.0 3.6 3.8  CL 95* 98 98 97* 102 97*  CO2 20* 24 22 25  21* 25  GLUCOSE 233* 141* 120* 140* 108* 138*  BUN 68* 47* 60* 64* 64* 35*  CREATININE 9.10* 6.68* 8.41* 8.61* 8.84* 6.38*  CALCIUM 6.6* 7.7* 6.8* 7.2* 7.1* 8.0*  PHOS 9.8*  --   --  5.1*  --   --    Liver Function Tests: Recent Labs  Lab 01/05/19 1340 01/08/19 1325 01/10/19 1505  AST 25  --   --   ALT <5  --   --   ALKPHOS 114  --   --   BILITOT 1.1  --   --   PROT 5.9*  --   --   ALBUMIN 2.7* 2.5* 2.5*   No results for input(s): LIPASE, AMYLASE in the last 168 hours. No results for input(s): AMMONIA in the last 168 hours. CBC: Recent Labs  Lab 01/07/19 0327  01/09/19 0530 01/09/19 2017  01/10/19 0440 01/10/19 1505 01/11/19 0527 01/12/19 0424  WBC 16.5*   < > 12.6*  --  11.9* 12.9* 10.9* 13.9*  NEUTROABS 13.8*  --  9.8*  --  8.8*  --  7.7 10.4*  HGB 7.6*   < > 6.9* 7.4* 7.2* 8.5* 8.2* 8.4*  HCT 23.6*   < > 21.1* 22.3* 22.3* 25.6* 25.4* 26.5*  MCV 96.3   < > 98.1  --  97.4 95.9  97.7 99.6  PLT 213   < > 230  --  232 255 271 307   < > = values in this interval not displayed.   Cardiac Enzymes: No results for input(s): CKTOTAL, CKMB, CKMBINDEX, TROPONINI in the last 168 hours. BNP: Invalid input(s): POCBNP CBG: Recent Labs  Lab 01/11/19 0651 01/11/19 1200 01/11/19 1625 01/11/19 2033 01/12/19 0701  GLUCAP 99 133* 217* 168* 132*   D-Dimer No results for input(s): DDIMER in the last 72 hours. Hgb A1c No results for input(s): HGBA1C in the last 72 hours. Lipid Profile No results for input(s): CHOL, HDL, LDLCALC, TRIG, CHOLHDL, LDLDIRECT in the last 72 hours. Thyroid function studies No results for input(s): TSH, T4TOTAL, T3FREE, THYROIDAB in the last 72 hours.  Invalid input(s): FREET3 Anemia work up No results for input(s): VITAMINB12, FOLATE, FERRITIN, TIBC, IRON, RETICCTPCT in the last 72 hours. Urinalysis    Component Value Date/Time   COLORURINE RED (A) 01/06/2019 1118   APPEARANCEUR TURBID (A) 01/06/2019 1118   LABSPEC  01/06/2019 1118    TEST NOT REPORTED DUE TO COLOR INTERFERENCE OF URINE PIGMENT   PHURINE  01/06/2019 1118    TEST NOT REPORTED DUE TO COLOR INTERFERENCE OF URINE PIGMENT   GLUCOSEU (A) 01/06/2019 1118    TEST NOT REPORTED DUE TO COLOR INTERFERENCE OF URINE PIGMENT   HGBUR (A) 01/06/2019 1118    TEST NOT REPORTED DUE TO COLOR INTERFERENCE OF URINE PIGMENT   BILIRUBINUR (A) 01/06/2019 1118    TEST NOT REPORTED DUE TO COLOR INTERFERENCE OF URINE PIGMENT   KETONESUR (A) 01/06/2019 1118    TEST NOT REPORTED DUE TO COLOR INTERFERENCE OF URINE PIGMENT   PROTEINUR (A) 01/06/2019 1118    TEST NOT REPORTED DUE TO COLOR INTERFERENCE OF  URINE PIGMENT   NITRITE (A) 01/06/2019 1118    TEST NOT REPORTED DUE TO COLOR INTERFERENCE OF URINE PIGMENT   LEUKOCYTESUR (A) 01/06/2019 1118    TEST NOT REPORTED DUE TO COLOR INTERFERENCE OF URINE PIGMENT   Sepsis Labs Invalid input(s): PROCALCITONIN,  WBC,  LACTICIDVEN Microbiology Recent Results (from the past 240 hour(s))  SARS Coronavirus 2 [facility] order, Performed in [facility] hospital lab) Nasopharyngeal Nasopharyngeal Swab     Status: None   Collection Time: 01/05/19  5:50 PM   Specimen: Nasopharyngeal Swab  Result Value Ref Range Status   SARS Coronavirus 2 NEGATIVE NEGATIVE Final    Comment: (NOTE) If result is NEGATIVE SARS-CoV-2 target nucleic acids are NOT DETECTED. The SARS-CoV-2 RNA is generally detectable in upper and lower  respiratory specimens during the acute phase of infection. The lowest  concentration of SARS-CoV-2 viral copies this assay can detect is 250  copies / mL. A negative result does not preclude SARS-CoV-2 infection  and should not be used as the sole basis for treatment or other  patient management decisions.  A negative result may occur with  improper specimen collection / handling, submission of specimen other  than nasopharyngeal swab, presence of viral mutation(s) within the  areas targeted by this assay, and inadequate number of viral copies  (<250 copies / mL). A negative result must be combined with clinical  observations, patient history, and epidemiological information. If result is POSITIVE SARS-CoV-2 target nucleic acids are DETECTED. The SARS-CoV-2 RNA is generally detectable in upper and lower  respiratory specimens dur ing the acute phase of infection.  Positive  results are indicative of active infection with SARS-CoV-2.  Clinical  correlation with patient history and other diagnostic  information is  necessary to determine patient infection status.  Positive results do  not rule out bacterial infection or co-infection with  other viruses. If result is PRESUMPTIVE POSTIVE SARS-CoV-2 nucleic acids MAY BE PRESENT.   A presumptive positive result was obtained on the submitted specimen  and confirmed on repeat testing.  While 2019 novel coronavirus  (SARS-CoV-2) nucleic acids may be present in the submitted sample  additional confirmatory testing may be necessary for epidemiological  and / or clinical management purposes  to differentiate between  SARS-CoV-2 and other Sarbecovirus currently known to infect humans.  If clinically indicated additional testing with an alternate test  methodology [phone]) is advised. The SARS-CoV-2 RNA is generally  detectable in upper and lower respiratory sp ecimens during the acute  phase of infection. The expected result is Negative. Fact Sheet for Patients:  StrictlyIdeas.no Fact Sheet for Healthcare Providers: BankingDealers.co.za This test is not yet approved or cleared by the [place] FDA and has been authorized for detection and/or diagnosis of SARS-CoV-2 by FDA under an Emergency Use Authorization (EUA).  This EUA will remain in effect (meaning this test can be used) for the duration of the COVID-19 declaration under Section 564(b)(1) of the Act, 21 U.S.C. section 360bbb-3(b)(1), unless the authorization is terminated or revoked sooner. Performed at [facility], [place] [address], [place], [place] [address]   MRSA PCR Screening     Status: None   Collection Time: 01/05/19 10:48 PM   Specimen: Nasopharyngeal  Result Value Ref Range Status   MRSA by PCR NEGATIVE NEGATIVE Final    Comment:        The GeneXpert MRSA Assay (FDA approved for NASAL specimens only), is one component of a comprehensive MRSA colonization surveillance program. It is not intended to diagnose MRSA infection nor to guide or monitor treatment for MRSA infections. Performed at [facility], [place] [address], [place], [place]  [address]   Urine culture     Status: None   Collection Time: 01/06/19 11:34 AM   Specimen: Urine, Random  Result Value Ref Range Status   Specimen Description URINE, RANDOM  Final   Special Requests NONE  Final   Culture   Final    NO GROWTH Performed at [facility], [place] [address], [place], [place] [address]    Report Status 01/07/2019 FINAL  Final  Culture, Urine     Status: None   Collection Time: 01/10/19  6:06 PM   Specimen: Urine, Catheterized  Result Value Ref Range Status   Specimen Description URINE, CATHETERIZED  Final   Special Requests NONE  Final   Culture   Final    NO GROWTH Performed at [facility], [address], [place], [place] [address]    Report Status 01/11/2019 FINAL  Final     Time coordinating discharge: 45 minutes  SIGNED:   Tawni Millers, MD  [facility] 01/12/2019, 10:40 AM

## 2019-01-28 DIAGNOSIS — R600 Localized edema: Secondary | ICD-10-CM | POA: Insufficient documentation

## 2019-01-31 ENCOUNTER — Encounter: Payer: Self-pay | Admitting: Podiatry

## 2019-01-31 ENCOUNTER — Ambulatory Visit (INDEPENDENT_AMBULATORY_CARE_PROVIDER_SITE_OTHER): Payer: Medicare Other | Admitting: Podiatry

## 2019-01-31 ENCOUNTER — Other Ambulatory Visit: Payer: Self-pay

## 2019-01-31 VITALS — Temp 98.4°F | Resp 16

## 2019-01-31 DIAGNOSIS — B351 Tinea unguium: Secondary | ICD-10-CM | POA: Diagnosis not present

## 2019-01-31 DIAGNOSIS — M2042 Other hammer toe(s) (acquired), left foot: Secondary | ICD-10-CM

## 2019-01-31 DIAGNOSIS — E119 Type 2 diabetes mellitus without complications: Secondary | ICD-10-CM

## 2019-01-31 DIAGNOSIS — M2142 Flat foot [pes planus] (acquired), left foot: Secondary | ICD-10-CM

## 2019-01-31 DIAGNOSIS — M2141 Flat foot [pes planus] (acquired), right foot: Secondary | ICD-10-CM | POA: Diagnosis not present

## 2019-01-31 DIAGNOSIS — E1142 Type 2 diabetes mellitus with diabetic polyneuropathy: Secondary | ICD-10-CM | POA: Diagnosis not present

## 2019-01-31 DIAGNOSIS — M2041 Other hammer toe(s) (acquired), right foot: Secondary | ICD-10-CM

## 2019-01-31 NOTE — Progress Notes (Signed)
Subjective:  Patient ID: Richard Hall, male    DOB: 1955/08/04,  MRN: [id number]  Chief Complaint  Patient presents with  . debride    DIabetic foot exam and trimming   . Diabetes    FBS: 230 a1C: 5.6 PCP: Ramos x 6 wks    63 y.o. male presents  for diabetic foot care. Last AMBS was 230. Denies numbness and tingling in their feet. Denies cramping in legs and thighs.  Review of Systems: Negative except as noted in the HPI. Denies N/V/F/Ch.  Past Medical History:  Diagnosis Date  . CVA (cerebral vascular accident) ([place]) 2010  . Diabetes mellitus with end-stage renal disease ([place])   . Dyslipidemia   . Essential hypertension   . Hypothyroidism (acquired)   . OSA on CPAP     Current Outpatient Medications:  .  amitriptyline (ELAVIL) 100 MG tablet, Take 100 mg by mouth at bedtime., Disp: , Rfl:  .  atorvastatin (LIPITOR) 40 MG tablet, Take 40 mg by mouth daily., Disp: , Rfl:  .  DULoxetine (CYMBALTA) 30 MG capsule, Take 30 mg by mouth daily., Disp: , Rfl:  .  ferric citrate (AURYXIA) 1 GM 210 MG(Fe) tablet, Take 420 mg by mouth 3 (three) times daily with meals., Disp: , Rfl:  .  furosemide (LASIX) 80 MG tablet, Take 80 mg by mouth 4 (four) times a week. Mondays, wednesdays, fridays, and sundays, Disp: , Rfl:  .  gemfibrozil (LOPID) 600 MG tablet, Take 600 mg by mouth 2 (two) times daily., Disp: , Rfl:  .  glipiZIDE (GLUCOTROL) 10 MG tablet, Take 10 mg by mouth daily., Disp: , Rfl:  .  hydrALAZINE (APRESOLINE) 100 MG tablet, Take 50 mg by mouth 3 (three) times daily., Disp: , Rfl:  .  Insulin Glargine (LANTUS SOLOSTAR) 100 UNIT/ML Solostar Pen, Inject 40 Units into the skin daily as needed., Disp: , Rfl:  .  isosorbide mononitrate (IMDUR) 60 MG 24 hr tablet, Take 60 mg by mouth daily., Disp: , Rfl:  .  levothyroxine (SYNTHROID) 50 MCG tablet, Take 50 mcg by mouth daily., Disp: , Rfl:  .  lidocaine-prilocaine (EMLA) cream, Apply 1 application topically See admin instructions. Apply  small amount to access site 1-2 hours before dialysis, cover with occlusive dressing (saran wrap), Disp: , Rfl:  .  LINZESS 72 MCG capsule, Take 72 mcg by mouth daily., Disp: , Rfl:  .  metoprolol tartrate (LOPRESSOR) 25 MG tablet, Take 25 mg by mouth daily., Disp: , Rfl:  .  NIFEdipine (ADALAT CC) 90 MG 24 hr tablet, Take 90 mg by mouth daily before breakfast. 30 minutes before breakfast, Disp: , Rfl:   Social History   Tobacco Use  Smoking Status Former Smoker  . Quit date: 27  . Years since quitting: 23.6  Smokeless Tobacco Never Used    Allergies  Allergen Reactions  . Gabapentin Swelling   Objective:   Vitals:   01/31/19 1410  Resp: 16  Temp: 98.4 F (36.9 C)   There is no height or weight on file to calculate BMI. Constitutional Well developed. Well nourished.  Vascular Dorsalis pedis pulses present 1+ bilaterally  Posterior tibial pulses present 1+ bilaterally  Pedal hair growth absent. Capillary refill normal to all digits.  No cyanosis or clubbing noted.  Neurologic Normal speech. Oriented to person, place, and time. Epicritic sensation to light touch grossly present bilaterally. Protective sensation with 5.07 monofilament  absent bilaterally.  Dermatologic Nails elongated, thickened, dystrophic. No open wounds. No  skin lesions.  Orthopedic: Normal joint ROM without pain or crepitus bilaterally. Pes planus bilat, hammertoes bilat. No bony tenderness.   Assessment:   1. DM type 2 with diabetic peripheral neuropathy ([place])   2. Encounter for diabetic foot exam ([place])   3. Hammertoes of both feet   4. Pes planus of both feet   5. Onychomycosis    Plan:  Patient was evaluated and treated and all questions answered.  Diabetes with DPN, Onychomycosis -Educated on diabetic footcare. Diabetic risk level 1 -Nails x10 debrided sharply and manually with large nail nipper and rotary burr.  -Will make appt for DM shoes.  Procedure: Nail Debridement Rationale:  Patient meets criteria for routine foot care due to DPN Type of Debridement: manual, sharp debridement. Instrumentation: Nail nipper, rotary burr. Number of Nails: 10   Return in about 3 months (around 05/02/2019).

## 2019-02-09 ENCOUNTER — Ambulatory Visit: Payer: Medicare Other | Admitting: Orthotics

## 2019-02-09 ENCOUNTER — Other Ambulatory Visit: Payer: Self-pay

## 2019-02-09 DIAGNOSIS — E1142 Type 2 diabetes mellitus with diabetic polyneuropathy: Secondary | ICD-10-CM

## 2019-02-09 DIAGNOSIS — M2041 Other hammer toe(s) (acquired), right foot: Secondary | ICD-10-CM

## 2019-02-09 DIAGNOSIS — E119 Type 2 diabetes mellitus without complications: Secondary | ICD-10-CM

## 2019-02-09 DIAGNOSIS — M2042 Other hammer toe(s) (acquired), left foot: Secondary | ICD-10-CM

## 2019-02-09 NOTE — Progress Notes (Signed)

## 2019-03-16 ENCOUNTER — Ambulatory Visit: Payer: Medicare Other | Admitting: Orthotics

## 2019-03-16 ENCOUNTER — Other Ambulatory Visit: Payer: Self-pay

## 2019-03-16 DIAGNOSIS — E119 Type 2 diabetes mellitus without complications: Secondary | ICD-10-CM

## 2019-03-16 DIAGNOSIS — M2142 Flat foot [pes planus] (acquired), left foot: Secondary | ICD-10-CM

## 2019-03-16 DIAGNOSIS — M2041 Other hammer toe(s) (acquired), right foot: Secondary | ICD-10-CM

## 2019-03-16 DIAGNOSIS — E1142 Type 2 diabetes mellitus with diabetic polyneuropathy: Secondary | ICD-10-CM

## 2019-03-16 DIAGNOSIS — M2141 Flat foot [pes planus] (acquired), right foot: Secondary | ICD-10-CM

## 2019-03-16 NOTE — Progress Notes (Signed)

## 2019-04-04 ENCOUNTER — Other Ambulatory Visit (HOSPITAL_COMMUNITY): Payer: Self-pay | Admitting: Urology

## 2019-04-04 DIAGNOSIS — C61 Malignant neoplasm of prostate: Secondary | ICD-10-CM

## 2019-04-18 ENCOUNTER — Encounter (HOSPITAL_COMMUNITY)
Admission: RE | Admit: 2019-04-18 | Discharge: 2019-04-18 | Disposition: A | Discharge status: Home / Self Care | Payer: Medicare Other | Source: Ambulatory Visit | Attending: Urology | Admitting: Urology

## 2019-04-18 ENCOUNTER — Other Ambulatory Visit: Payer: Self-pay

## 2019-04-18 DIAGNOSIS — C61 Malignant neoplasm of prostate: Secondary | ICD-10-CM | POA: Diagnosis not present

## 2019-04-18 MED ORDER — TECHNETIUM TC 99M MEDRONATE IV KIT
20.0000 | PACK | Freq: Once | INTRAVENOUS | Status: AC | PRN
  Administered 2019-04-18: 21.8 via INTRAVENOUS

## 2019-05-02 ENCOUNTER — Ambulatory Visit: Payer: Medicare Other | Admitting: Podiatry

## 2019-05-14 ENCOUNTER — Inpatient Hospital Stay (HOSPITAL_COMMUNITY)
Admission: EM | Admit: 2019-05-14 | Discharge: 2019-05-17 | DRG: 480 | Disposition: A | Discharge status: Home / Self Care | Payer: Medicare Other | Attending: Student in an Organized Health Care Education/Training Program | Admitting: Student in an Organized Health Care Education/Training Program

## 2019-05-14 ENCOUNTER — Other Ambulatory Visit: Payer: Self-pay

## 2019-05-14 ENCOUNTER — Encounter (HOSPITAL_COMMUNITY): Payer: Self-pay | Admitting: Nephrology

## 2019-05-14 DIAGNOSIS — R42 Dizziness and giddiness: Secondary | ICD-10-CM | POA: Diagnosis present

## 2019-05-14 DIAGNOSIS — Z79890 Hormone replacement therapy (postmenopausal): Secondary | ICD-10-CM

## 2019-05-14 DIAGNOSIS — Z87891 Personal history of nicotine dependence: Secondary | ICD-10-CM

## 2019-05-14 DIAGNOSIS — I4891 Unspecified atrial fibrillation: Secondary | ICD-10-CM | POA: Diagnosis present

## 2019-05-14 DIAGNOSIS — G4733 Obstructive sleep apnea (adult) (pediatric): Secondary | ICD-10-CM | POA: Diagnosis present

## 2019-05-14 DIAGNOSIS — E1122 Type 2 diabetes mellitus with diabetic chronic kidney disease: Secondary | ICD-10-CM | POA: Diagnosis present

## 2019-05-14 DIAGNOSIS — E039 Hypothyroidism, unspecified: Secondary | ICD-10-CM | POA: Diagnosis present

## 2019-05-14 DIAGNOSIS — D631 Anemia in chronic kidney disease: Secondary | ICD-10-CM | POA: Diagnosis present

## 2019-05-14 DIAGNOSIS — R531 Weakness: Secondary | ICD-10-CM

## 2019-05-14 DIAGNOSIS — I480 Paroxysmal atrial fibrillation: Secondary | ICD-10-CM | POA: Diagnosis present

## 2019-05-14 DIAGNOSIS — C7951 Secondary malignant neoplasm of bone: Secondary | ICD-10-CM | POA: Diagnosis present

## 2019-05-14 DIAGNOSIS — M84552A Pathological fracture in neoplastic disease, left femur, initial encounter for fracture: Secondary | ICD-10-CM | POA: Diagnosis present

## 2019-05-14 DIAGNOSIS — Z79899 Other long term (current) drug therapy: Secondary | ICD-10-CM

## 2019-05-14 DIAGNOSIS — E785 Hyperlipidemia, unspecified: Secondary | ICD-10-CM | POA: Diagnosis present

## 2019-05-14 DIAGNOSIS — S72452A Displaced supracondylar fracture without intracondylar extension of lower end of left femur, initial encounter for closed fracture: Secondary | ICD-10-CM | POA: Diagnosis not present

## 2019-05-14 DIAGNOSIS — C61 Malignant neoplasm of prostate: Secondary | ICD-10-CM | POA: Diagnosis present

## 2019-05-14 DIAGNOSIS — E872 Acidosis: Secondary | ICD-10-CM | POA: Diagnosis present

## 2019-05-14 DIAGNOSIS — Z794 Long term (current) use of insulin: Secondary | ICD-10-CM

## 2019-05-14 DIAGNOSIS — Z992 Dependence on renal dialysis: Secondary | ICD-10-CM

## 2019-05-14 DIAGNOSIS — N186 End stage renal disease: Secondary | ICD-10-CM | POA: Diagnosis present

## 2019-05-14 DIAGNOSIS — I120 Hypertensive chronic kidney disease with stage 5 chronic kidney disease or end stage renal disease: Secondary | ICD-10-CM | POA: Diagnosis present

## 2019-05-14 DIAGNOSIS — Z888 Allergy status to other drugs, medicaments and biological substances status: Secondary | ICD-10-CM

## 2019-05-14 DIAGNOSIS — E875 Hyperkalemia: Secondary | ICD-10-CM

## 2019-05-14 DIAGNOSIS — Z8673 Personal history of transient ischemic attack (TIA), and cerebral infarction without residual deficits: Secondary | ICD-10-CM

## 2019-05-14 DIAGNOSIS — Z8546 Personal history of malignant neoplasm of prostate: Secondary | ICD-10-CM

## 2019-05-14 DIAGNOSIS — I482 Chronic atrial fibrillation, unspecified: Secondary | ICD-10-CM | POA: Diagnosis present

## 2019-05-14 DIAGNOSIS — F329 Major depressive disorder, single episode, unspecified: Secondary | ICD-10-CM | POA: Diagnosis present

## 2019-05-14 DIAGNOSIS — W010XXA Fall on same level from slipping, tripping and stumbling without subsequent striking against object, initial encounter: Secondary | ICD-10-CM | POA: Diagnosis present

## 2019-05-14 DIAGNOSIS — Z20828 Contact with and (suspected) exposure to other viral communicable diseases: Secondary | ICD-10-CM | POA: Diagnosis present

## 2019-05-14 DIAGNOSIS — R338 Other retention of urine: Secondary | ICD-10-CM | POA: Diagnosis present

## 2019-05-14 LAB — BASIC METABOLIC PANEL
Anion gap: 14 (ref 5–15)
BUN: 72 mg/dL — ABNORMAL HIGH (ref 8–23)
CO2: 19 mmol/L — ABNORMAL LOW (ref 22–32)
Calcium: 5 mg/dL — CL (ref 8.9–10.3)
Chloride: 102 mmol/L (ref 98–111)
Creatinine, Ser: 5.15 mg/dL — ABNORMAL HIGH (ref 0.61–1.24)
GFR calc Af Amer: 13 mL/min — ABNORMAL LOW (ref >60)
GFR calc non Af Amer: 11 mL/min — ABNORMAL LOW (ref >60)
Glucose, Bld: 226 mg/dL — ABNORMAL HIGH (ref 70–99)
Potassium: 5.5 mmol/L — ABNORMAL HIGH (ref 3.5–5.1)
Sodium: 135 mmol/L (ref 135–145)

## 2019-05-14 LAB — HEPATIC FUNCTION PANEL
ALT: 15 U/L (ref 0–44)
AST: 18 U/L (ref 15–41)
Albumin: 2.7 g/dL — ABNORMAL LOW (ref 3.5–5.0)
Alkaline Phosphatase: 1167 U/L — ABNORMAL HIGH (ref 38–126)
Bilirubin, Direct: 0.1 mg/dL (ref 0.0–0.2)
Indirect Bilirubin: 0.8 mg/dL (ref 0.3–0.9)
Total Bilirubin: 0.9 mg/dL (ref 0.3–1.2)
Total Protein: 7 g/dL (ref 6.5–8.1)

## 2019-05-14 LAB — HEMOGLOBIN A1C
Hgb A1c MFr Bld: 7.8 % — ABNORMAL HIGH (ref 4.8–5.6)
Mean Plasma Glucose: 177.16 mg/dL

## 2019-05-14 LAB — GLUCOSE, CAPILLARY
Glucose-Capillary: 143 mg/dL — ABNORMAL HIGH (ref 70–99)
Glucose-Capillary: 221 mg/dL — ABNORMAL HIGH (ref 70–99)

## 2019-05-14 LAB — VITAMIN D 25 HYDROXY (VIT D DEFICIENCY, FRACTURES): Vit D, 25-Hydroxy: 12.71 ng/mL — ABNORMAL LOW (ref 30–100)

## 2019-05-14 LAB — CBC
HCT: 26.1 % — ABNORMAL LOW (ref 39.0–52.0)
Hemoglobin: 8 g/dL — ABNORMAL LOW (ref 13.0–17.0)
MCH: 29 pg (ref 26.0–34.0)
MCHC: 30.7 g/dL (ref 30.0–36.0)
MCV: 94.6 fL (ref 80.0–100.0)
Platelets: 208 10*3/uL (ref 150–400)
RBC: 2.76 MIL/uL — ABNORMAL LOW (ref 4.22–5.81)
RDW: 16.7 % — ABNORMAL HIGH (ref 11.5–15.5)
WBC: 7.9 10*3/uL (ref 4.0–10.5)
nRBC: 0.8 % — ABNORMAL HIGH (ref 0.0–0.2)

## 2019-05-14 LAB — RESPIRATORY PANEL BY RT PCR (FLU A&B, COVID)
Influenza A by PCR: NEGATIVE
Influenza B by PCR: NEGATIVE
SARS Coronavirus 2 by RT PCR: NEGATIVE

## 2019-05-14 LAB — MAGNESIUM: Magnesium: 1.7 mg/dL (ref 1.7–2.4)

## 2019-05-14 MED ORDER — FUROSEMIDE 80 MG PO TABS
80.0000 mg | ORAL_TABLET | ORAL | Status: DC
  Administered 2019-05-14 – 2019-05-17 (×3): 80 mg via ORAL
  Filled 2019-05-14: qty 4
  Filled 2019-05-14 (×2): qty 1

## 2019-05-14 MED ORDER — GEMFIBROZIL 600 MG PO TABS
600.0000 mg | ORAL_TABLET | Freq: Two times a day (BID) | ORAL | Status: DC
  Administered 2019-05-14 – 2019-05-16 (×3): 600 mg via ORAL
  Filled 2019-05-14 (×5): qty 1

## 2019-05-14 MED ORDER — METOPROLOL TARTRATE 25 MG PO TABS
25.0000 mg | ORAL_TABLET | Freq: Every day | ORAL | Status: DC
  Administered 2019-05-15 – 2019-05-17 (×3): 25 mg via ORAL
  Filled 2019-05-14 (×3): qty 1

## 2019-05-14 MED ORDER — CHLORHEXIDINE GLUCONATE CLOTH 2 % EX PADS
6.0000 | MEDICATED_PAD | Freq: Every day | CUTANEOUS | Status: DC
  Administered 2019-05-15 – 2019-05-16 (×2): 6 via TOPICAL

## 2019-05-14 MED ORDER — SODIUM CHLORIDE 0.9 % IV SOLN: 1.0000 g | Freq: Once | INTRAVENOUS | Status: DC

## 2019-05-14 MED ORDER — INSULIN ASPART 100 UNIT/ML ~~LOC~~ SOLN
0.0000 [IU] | Freq: Three times a day (TID) | SUBCUTANEOUS | Status: DC
  Administered 2019-05-14: 2 [IU] via SUBCUTANEOUS
  Administered 2019-05-15: 8 [IU] via SUBCUTANEOUS
  Administered 2019-05-16 (×2): 2 [IU] via SUBCUTANEOUS
  Administered 2019-05-17: 13:00:00 3 [IU] via SUBCUTANEOUS
  Administered 2019-05-17: 2 [IU] via SUBCUTANEOUS

## 2019-05-14 MED ORDER — APIXABAN 2.5 MG PO TABS
2.5000 mg | ORAL_TABLET | Freq: Two times a day (BID) | ORAL | Status: DC
  Administered 2019-05-14 – 2019-05-15 (×2): 2.5 mg via ORAL
  Filled 2019-05-14 (×2): qty 1

## 2019-05-14 MED ORDER — AMITRIPTYLINE HCL 50 MG PO TABS
100.0000 mg | ORAL_TABLET | Freq: Every day | ORAL | Status: DC
  Administered 2019-05-14 – 2019-05-16 (×3): 100 mg via ORAL
  Filled 2019-05-14: qty 2
  Filled 2019-05-14: qty 1
  Filled 2019-05-14: qty 2
  Filled 2019-05-14: qty 4
  Filled 2019-05-14: qty 2
  Filled 2019-05-14: qty 1

## 2019-05-14 MED ORDER — ACETAMINOPHEN 650 MG RE SUPP: 650.0000 mg | Freq: Four times a day (QID) | RECTAL | Status: DC | PRN

## 2019-05-14 MED ORDER — HYDRALAZINE HCL 50 MG PO TABS
50.0000 mg | ORAL_TABLET | Freq: Three times a day (TID) | ORAL | Status: DC
  Administered 2019-05-15 – 2019-05-17 (×6): 50 mg via ORAL
  Filled 2019-05-14 (×6): qty 1

## 2019-05-14 MED ORDER — CALCIUM CARBONATE ANTACID 500 MG PO CHEW
2.0000 | CHEWABLE_TABLET | Freq: Three times a day (TID) | ORAL | Status: DC
  Administered 2019-05-14 – 2019-05-17 (×9): 400 mg via ORAL
  Filled 2019-05-14 (×10): qty 2

## 2019-05-14 MED ORDER — HEPARIN SODIUM (PORCINE) 5000 UNIT/ML IJ SOLN
5000.0000 [IU] | Freq: Three times a day (TID) | INTRAMUSCULAR | Status: DC
  Administered 2019-05-14: 5000 [IU] via SUBCUTANEOUS
  Filled 2019-05-14: qty 1

## 2019-05-14 MED ORDER — LEVOTHYROXINE SODIUM 75 MCG PO TABS
75.0000 ug | ORAL_TABLET | Freq: Every morning | ORAL | Status: DC
  Administered 2019-05-15 – 2019-05-16 (×2): 75 ug via ORAL
  Filled 2019-05-14 (×3): qty 1

## 2019-05-14 MED ORDER — SODIUM CHLORIDE 0.9% FLUSH
3.0000 mL | Freq: Two times a day (BID) | INTRAVENOUS | Status: DC
  Administered 2019-05-14 – 2019-05-17 (×7): 3 mL via INTRAVENOUS

## 2019-05-14 MED ORDER — BICALUTAMIDE 50 MG PO TABS
50.0000 mg | ORAL_TABLET | Freq: Every day | ORAL | Status: DC
  Administered 2019-05-15 – 2019-05-17 (×3): 50 mg via ORAL
  Filled 2019-05-14 (×3): qty 1

## 2019-05-14 MED ORDER — ISOSORBIDE MONONITRATE ER 60 MG PO TB24
60.0000 mg | ORAL_TABLET | Freq: Every day | ORAL | Status: DC
  Administered 2019-05-15 – 2019-05-17 (×3): 60 mg via ORAL
  Filled 2019-05-14 (×3): qty 1

## 2019-05-14 MED ORDER — FERRIC CITRATE 1 GM 210 MG(FE) PO TABS
420.0000 mg | ORAL_TABLET | Freq: Three times a day (TID) | ORAL | Status: DC
  Administered 2019-05-14 – 2019-05-17 (×7): 420 mg via ORAL
  Filled 2019-05-14 (×10): qty 2

## 2019-05-14 MED ORDER — CALCITRIOL 0.5 MCG PO CAPS
4.0000 ug | ORAL_CAPSULE | ORAL | Status: DC
  Administered 2019-05-14 – 2019-05-17 (×2): 4 ug via ORAL
  Filled 2019-05-14: qty 8

## 2019-05-14 MED ORDER — DULOXETINE HCL 30 MG PO CPEP
30.0000 mg | ORAL_CAPSULE | Freq: Every day | ORAL | Status: DC
  Administered 2019-05-14 – 2019-05-17 (×4): 30 mg via ORAL
  Filled 2019-05-14 (×4): qty 1

## 2019-05-14 MED ORDER — CALCIUM GLUCONATE-NACL 1-0.675 GM/50ML-% IV SOLN
1.0000 g | Freq: Once | INTRAVENOUS | Status: AC
  Administered 2019-05-14: 1000 mg via INTRAVENOUS
  Filled 2019-05-14: qty 50

## 2019-05-14 MED ORDER — SODIUM BICARBONATE 8.4 % IV SOLN
50.0000 meq | Freq: Once | INTRAVENOUS | Status: AC
  Administered 2019-05-14: 50 meq via INTRAVENOUS
  Filled 2019-05-14: qty 50

## 2019-05-14 MED ORDER — ACETAMINOPHEN 325 MG PO TABS: 650.0000 mg | ORAL_TABLET | Freq: Four times a day (QID) | ORAL | Status: DC | PRN

## 2019-05-14 NOTE — H&P (Addendum)
Date: 05/14/2019               Patient Name:  Richard Hall MRN: [id number]  DOB: Jan 31, 1956 Age / Sex: 63 y.o., male   PCP: [name], MD              Medical Service: Internal Medicine Teaching Service              Attending Physician: Dr. Evette Doffing, Mallie Mussel, *    First Contact: Laverna Peace, MS 3 Pager: [phone]  Second Contact: Dr. Ladona Horns Pager: [phone]  Third Contact Dr. Neva Seat Pager: [phone]       After Hours (After 5p/  First Contact Pager: [phone]  weekends / holidays): Second Contact Pager: [phone]   Chief Complaint: dizziness  History of Present Illness: Richard Hall is a 63 yo male with a history of ESRD on HD, atrial fibrillation, hypertension, CVA, prostate cancer with chronic Foley, and hypothyroidism who presents to the emergency room after an episode of dizziness this morning at dialysis.  Pt reported to his dialysis center this morning when he noticed he felt lightheaded. When his vitals were assessed, his blood pressure was low and his heart rhythm was irregular. He was brought to the ED prior to receiving any dialysis treatment. Pt felt improved on the way to the ED and has not had any additional episodes of dizziness since.  Endorses brief episodes of dizziness "every now and then," and thinks that is "when his heart beats funny." He doesn't know how frequently it occurs, but guesses about once a week and does not last more than a few minutes. Denies any syncopal events.  Of note, pt has a history of paroxysmal atrial fibrillation during his hospitalization in August. He is not on anticoagulation due to an episode of significant bleeding in the bladder during that same admission which is when his prostate cancer was found. Pt has dialysis scheduled Tues-Thurs-Saturday, but reports that he does sometimes miss dialysis once a week due to back pain from his metastatic bone lesions.  Pt also has had a productive cough of clear sputum for 1  week. He says it occurs randomly throughout the day and does not bother him significantly. Pt denies any chest pain, dyspnea, palpitations, sore throat, fever, sinus pressure, diaphoresis, sleep changes, changes in appetite, vision changes, or headache. Pt denies frequent use of caffeine, changes in diet, any smoking, alcohol, or illicit drug use.  In the ED, pt was afebrile, non-tachycardic with HR 86, normotensive with BP 116/82, and RR 19 with O2 saturation 99% on room air. Lab work significant for Na 135, K 5.5, bicarb 19, BUN 72, Cr 5.15, BUN 72, Glc 226, and Ca of 5.0. Ca corrected to 6.0 with albumin of 2.7. Alk phos significant elevated to 1167. AST and ALT 18 and 15 respectively, and T bili 0.9. EKG remarkable for atrial fibrillation.   Meds:  No current facility-administered medications on file prior to encounter.   Current Outpatient Medications on File Prior to Encounter  Medication Sig Dispense Refill  . amitriptyline (ELAVIL) 100 MG tablet Take 100 mg by mouth at bedtime.    . bicalutamide (CASODEX) 50 MG tablet Take 50 mg by mouth daily.    . DULoxetine (CYMBALTA) 30 MG capsule Take 30 mg by mouth daily.    . EUTHYROX 75 MCG tablet Take 75 mcg by mouth every morning.    . ferric citrate (AURYXIA) 1 GM 210 MG(Fe) tablet Take 420 mg by  mouth 3 (three) times daily with meals.    . fluticasone (FLONASE ALLERGY RELIEF) 50 MCG/ACT nasal spray Place 1 spray into the nose as needed.    . furosemide (LASIX) 80 MG tablet Take 80 mg by mouth 4 (four) times a week. Mondays, wednesdays, fridays, and sundays    . gemfibrozil (LOPID) 600 MG tablet Take 600 mg by mouth 2 (two) times daily.    Marland Kitchen glipiZIDE (GLUCOTROL) 10 MG tablet Take 10 mg by mouth daily.    Marland Kitchen glipiZIDE (GLUCOTROL) 5 MG tablet Take 5 mg by mouth daily.    . hydrALAZINE (APRESOLINE) 100 MG tablet Take 50 mg by mouth 3 (three) times daily.    . isosorbide mononitrate (IMDUR) 60 MG 24 hr tablet Take 60 mg by mouth daily.    Marland Kitchen  LEUPROLIDE ACETATE, 6 MONTH, 45 MG injection Inject 45 mg into the skin every 6 (six) months.    . lidocaine-prilocaine (EMLA) cream Apply 1 application topically See admin instructions. Apply small amount to access site 1-2 hours before dialysis, cover with occlusive dressing (saran wrap)    . LINZESS 72 MCG capsule Take 72 mcg by mouth as needed.     . metoprolol tartrate (LOPRESSOR) 25 MG tablet Take 25 mg by mouth daily.    Marland Kitchen NIFEdipine (ADALAT CC) 90 MG 24 hr tablet Take 90 mg by mouth daily before breakfast. 30 minutes before breakfast    . PERCOCET 5-325 MG tablet Take 1 tablet by mouth every 6 (six) hours as needed for pain.    . TUMS ULTRA 1000 1000 MG chewable tablet Chew 4 tablets by mouth 2 (two) times daily.      Allergies: Allergies as of 05/14/2019 - Review Complete 05/14/2019  Allergen Reaction Noted  . Gabapentin Swelling 02/21/2011   Past Medical History:  Diagnosis Date  . CVA (cerebral vascular accident) ([place]) 2010  . Diabetes mellitus with end-stage renal disease ([place])   . Dyslipidemia   . Essential hypertension   . Hypothyroidism (acquired)   . OSA on CPAP    Social History: Pt lives alone in an apartment in [place]. He has a son that lives near by in [place] and who visits him. Pt reports he takes care of his house and activities of daily living independently. Pt reports he has no issues being able to afford medication since having Medicare and Medicaid. Prior history of tobacco use 23 years ago. Denies any recent EtOH, marijuana, or substance use.  Review of Systems: A complete ROS was negative except as per HPI.  Physical Exam: Blood pressure 116/82, pulse 86, temperature 97.6 F (36.4 C), temperature source Oral, resp. rate 19, height 6' (1.829 m), weight 114 kg, SpO2 99 %. Physical Exam Constitutional:      General: He is not in acute distress.    Appearance: He is obese. He is not toxic-appearing.  HENT:     Head: Normocephalic and atraumatic.    Cardiovascular:     Rate and Rhythm: Normal rate. Rhythm irregular.     Pulses: Normal pulses.     Heart sounds: Normal heart sounds. No murmur.  Pulmonary:     Effort: Pulmonary effort is normal. No respiratory distress.     Breath sounds: Normal breath sounds. No wheezing, rhonchi or rales.  Abdominal:     General: Bowel sounds are normal. There is no distension.     Palpations: Abdomen is soft.     Tenderness: There is no abdominal tenderness. There is  no guarding.  Musculoskeletal:     Cervical back: Normal range of motion and neck supple.     Right lower leg: Edema present.     Left lower leg: Edema present.     Comments: +1 pitting edema bilaterally  Skin:    General: Skin is warm and dry.     Coloration: Skin is not jaundiced.  Neurological:     General: No focal deficit present.     Mental Status: He is alert.  Psychiatric:        Mood and Affect: Mood normal.        Behavior: Behavior normal.    Labs: K: 5.5, Ca: 5.0, Albumin 2.7 Alk Phos: 1167 Hgb A1c: 7.8  EKG: Irregular rhythm, normal rate, right axis deviation  Assessment & Plan by Problem: Active Problems:   ESRD on hemodialysis (HCC)   History of atrial fibrillation   Hypocalcemia  Mr. Minella is a 63 yo male with a history of atrial fibrillation, CVA, hypertension, and ESRD on dialysis presenting for an episode of dizziness and hypotension who was found to be in atrial fibrillation with lab work significant for mild hyperkalemia and severe hypocalcemia.   Dizziness Atrial Fibrillation: EKG on admission with irregular heart beat and without P waved indicative of atrial fibrillation. Pt with dizziness and generalized weakness associated with this episode. Question if dizziness related to symptomatic a fib vs electrolyte abnormalities vs ?brain mets. Pt is hemodynamically stable at this time and not in RVR. During pt's prior admission in August, he had an episode of a fib with RVR and a flutter. He was rate  controlled with metoprolol and not started on anticoagulation secondary to an acute bleed in the patient's bladder during the same admission.  - rates under control with '25mg'$  metoprolol daily - CHADs2-VASc score of 3 with HAS-BLED score of 3 - readdress risk/benefits to anticoagulation with pt as he is recovered from his prior bleeding episode and would benefit from anticoagulation due to hypercoagulability associated with his malignancy - will continue to monitor for recurrent symptoms - could consider brain imaging if symptoms persist or recur to evaluate for metastasis    Hypocalcemia ESRD on hemodialysis: Pt missed dialysis today due to emergency room visit. Multiple associated electrolyte abnormalities, K 5.5, BUN 72, Cr 5.15, and corrected Ca 6.0.   - dialysis tonight or tomorrow Nephrology consulted, greatly appreciate recs - uncertain etiology for hypocalcemia - check PTH and vit D - continue Tums with '400mg'$  elemental Ca TID + qhs - completed '1000mg'$  calcium gluconate IV in the ED   Metastatic prostate cancer Pt with prostate cancer diagnosed in August of this year. Whole body bone scan on 04/18/2019 significant for diffuse scatted uptake throughout the axial skeleton associated with osseous metastases, with the most intense uptake at the R proximal humerus and L > R proximal femur. Per chart review, pt does not appear to have had any recent brain/head imaging to evaluate for metastases that could account for his episodes of generalized weakness and lightheadedness. - suspect elevated alk phos (to 1,167) associated with lytic bone lesions - continue treatment with bicalutamide '50mg'$  daily   HTN Initial BP 116/82 though repeat 155/72. Suspect secondary to volume overload due to missing dialysis today. - continue home hydralazine '50mg'$  TID, Imdur '60mg'$  daily, and metoprolol '25mg'$  daily - holding home nifedipine '90mg'$  daily   Diet - renal diet with 1200cc fluid restriction Fluids -  none DVT ppx - heparin 5,000 units subQ q8h CODE  STATUS - FULL CODE   Dispo: Admit patient to Observation with expected length of stay less than 2 midnights.  Signed: Carin Primrose, Medical Student 05/14/2019, 4:06 PM     Attestation for Student Documentation:  I personally was present and performed or re-performed the history, physical exam and medical decision-making activities of this service and have verified that the service and findings are accurately documented in the student's note.  Ladona Horns, MD 05/14/2019, 4:32 PM

## 2019-05-14 NOTE — Progress Notes (Signed)
   NAME:  Richard Hall, MRN:  [id number], DOB:  Oct 10, 1955, LOS: 0 ADMISSION DATE:  05/14/2019, Primary: [name], MD  CHIEF COMPLAINT:  dizziness   Brief History  63 yo male with PMH of HD dependent ESRD, atrial fibrillation, hypertension, CVA, prostate cancer with bone mets requiring chronic foley catheter and hypothyroidism who presented to [facility] ED on 12/12 for evaluation of dizziness that occurred during dialysis on morning of admission.  Subjective  No overnight events. No complaints. Notes he has been taking bicalutamide since the middle of November.  Objective   Blood pressure (!) 152/87, pulse 76, temperature 97.6 F (36.4 C), temperature source Oral, resp. rate 19, height 6' (1.829 m), weight 118.8 kg, SpO2 95 %.    Examination: GENERAL: in no acute distress PULMONARY: no apparent resp distress GU: foley cath present NEURO: alert and oriented SKIN: no rash or lesions on limited exam   Consults:  nephrology  Labs    BMP Latest Ref Rng & Units 05/14/2019 01/12/2019 01/11/2019  Glucose 70 - 99 mg/dL 226(H) 138(H) 108(H)  BUN 8 - 23 mg/dL 72(H) 35(H) 64(H)  Creatinine 0.61 - 1.24 mg/dL 5.15(H) 6.38(H) 8.84(H)  Sodium 135 - 145 mmol/L 135 135 139  Potassium 3.5 - 5.1 mmol/L 5.5(H) 3.8 3.6  Chloride 98 - 111 mmol/L 102 97(L) 102  CO2 22 - 32 mmol/L 19(L) 25 21(L)  Calcium 8.9 - 10.3 mg/dL 5.0(LL) 8.0(L) 7.1(L)    Summary  63 yo male with PMH of HD dependent ESRD, atrial fibrillation, hypertension, CVA, metastatic prostate cancer requiring chronic foley who was admitted to the [facility] for an episode of dizziness that occurred on morning of admission.  Assessment & Plan:  Active Problems:   ESRD on hemodialysis ([place])   History of atrial fibrillation   Hypocalcemia  RESOLVED Dizziness. Likely multifactorial including symptomatic afib  Atrial fibrillation. Rate controlled on metoprolol. Not currently anticoagulated due to acute bladder bleed in august. CHADsVASC  3. HASBLED 3.  Plan Continue metoprolol for afib rate control.  Eliquis 5mg  BID Tele monitoring  Prostate cancer. Recently diagnosed in August 2020. Bone scan in November suggestive of diffuse mets to the bone. Patient notes he was started on bicalutamide last month. Plan Continue bicalutamide for prostate cancer  ESRD on HD THS. Management per nephrology. Took off 3L at HD today. Hypocalcemia. Corrected calcium 6.0 this am. PTH elevated. Given his metastatic prostate cancer, possible he would have received denosumab from his outpatient oncologist however those records are not available today. Denosumab has potential for severe hypocalcemia in the setting of severe Vit D deficiency. Vitamin D deficiency. AGMA. AG 18. Bicarb 19.  Plan Continue calcitriol, calcium carbonate, and high dose Vit D. 4g IV Ca today. Will look into the denosumab tomorrow once clinic is open. Repeat BMP this afternoon. Will continue to monitor.  T2DM. Continue SSI Hypertension. Stable. Continue hydralazine, imdur and metoprolol. Nifedipine held on admission.  Best practice:  CODE STATUS: full Diet: renal. 1278mL fluid restriction DVT for prophylaxis: eliquis Dispo: likely discharge in 0-1d   Mitzi Hansen, MD [place] PGY-1 PAGER #: [phone] @TODAY @ 9:05 PM

## 2019-05-14 NOTE — ED Notes (Signed)
ED TO INPATIENT HANDOFF REPORT  ED Nurse Name and Phone #: Gavin Faivre  S Name/Age/Gender Richard Hall 63 y.o. male Room/Bed: 043C/043C  Code Status   Code Status: Full Code  Home/SNF/Other Home Patient oriented to: self, place, time and situation Is this baseline? Yes   Triage Complete: Triage complete  Chief Complaint Hypocalcemia [E83.51]  Triage Note Pt brought in by EMS from dialysis, pt schedule Tues/Thurs/Sat. Per pt, staff noticed pt had an irregular heart rate and wanted him evaluated. Per EMS pt c/o generalized weakness and was sent out prior to receiving his tx today. Pt denies CP, SOB, fevers, NVD.     Allergies Allergies  Allergen Reactions  . Gabapentin Swelling    Level of Care/Admitting Diagnosis ED Disposition    ED Disposition Condition [facility] Area: [place] [[id number]]  Level of Care: Telemetry Medical [104]  Covid Evaluation: Asymptomatic Screening Protocol (No Symptoms)  Diagnosis: Hypocalcemia [275.41.ICD-9-CM]  Admitting Physician: Axel Filler [[id number]]  Attending Physician: Axel Filler [phone]  PT Class (Do Not Modify): Observation [104]  PT Acc Code (Do Not Modify): Observation [10022]       B Medical/Surgery History Past Medical History:  Diagnosis Date  . CVA (cerebral vascular accident) ([place]) 2010  . Diabetes mellitus with end-stage renal disease ([place])   . Dyslipidemia   . Essential hypertension   . Hypothyroidism (acquired)   . OSA on CPAP    Past Surgical History:  Procedure Laterality Date  . APPENDECTOMY    . TRANSURETHRAL RESECTION OF BLADDER TUMOR N/A 01/07/2019   Procedure: CYSTOSCOPY/ EVEACUATION CLOT;  Surgeon: Bjorn Loser, MD;  Location: [facility];  Service: Urology;  Laterality: N/A;     A IV Location/Drains/Wounds Patient Lines/Drains/Airways Status   Active Line/Drains/Airways    Name:   Placement date:   Placement time:   Site:   Days:   Peripheral  IV 05/14/19 Right Forearm   05/14/19    --    Forearm   less than 1   Fistula / Graft Left Forearm Arteriovenous vein graft   --    --    Forearm      Urethral Catheter Lyda Kalata, MD Latex;Triple-lumen 22 Fr.   01/07/19    1625    Latex;Triple-lumen   127          Intake/Output Last 24 hours No intake or output data in the 24 hours ending 05/14/19 1608  Labs/Imaging Results for orders placed or performed during the hospital encounter of 05/14/19 (from the past 48 hour(s))  CBC     Status: Abnormal   Collection Time: 05/14/19 12:57 PM  Result Value Ref Range   WBC 7.9 4.0 - 10.5 K/uL   RBC 2.76 (L) 4.22 - 5.81 MIL/uL   Hemoglobin 8.0 (L) 13.0 - 17.0 g/dL   HCT 26.1 (L) 39.0 - 52.0 %   MCV 94.6 80.0 - 100.0 fL   MCH 29.0 26.0 - 34.0 pg   MCHC 30.7 30.0 - 36.0 g/dL   RDW 16.7 (H) 11.5 - 15.5 %   Platelets 208 150 - 400 K/uL   nRBC 0.8 (H) 0.0 - 0.2 %    Comment: Performed at [facility], [address], [place], [place] [address]  Basic metabolic panel     Status: Abnormal   Collection Time: 05/14/19 12:57 PM  Result Value Ref Range   Sodium 135 135 - 145 mmol/L   Potassium 5.5 (H) 3.5 -  5.1 mmol/L   Chloride 102 98 - 111 mmol/L   CO2 19 (L) 22 - 32 mmol/L   Glucose, Bld 226 (H) 70 - 99 mg/dL   BUN 72 (H) 8 - 23 mg/dL   Creatinine, Ser 5.15 (H) 0.61 - 1.24 mg/dL   Calcium 5.0 (LL) 8.9 - 10.3 mg/dL    Comment: CRITICAL RESULT CALLED TO, READ BACK BY AND VERIFIED WITH: K.SPRINGER RN @ 1353 05/14/2019 BY C.EDENS    GFR calc non Af Amer 11 (L) >60 mL/min   GFR calc Af Amer 13 (L) >60 mL/min   Anion gap 14 5 - 15    Comment: Performed at [facility], [place] [address], [place], [place] [address]  Hemoglobin A1c     Status: Abnormal   Collection Time: 05/14/19 12:57 PM  Result Value Ref Range   Hgb A1c MFr Bld 7.8 (H) 4.8 - 5.6 %    Comment: (NOTE) Pre diabetes:          5.7%-6.4% Diabetes:              >6.4% Glycemic control for   <7.0% adults with  diabetes    Mean Plasma Glucose 177.16 mg/dL    Comment: Performed at [facility], [place] [address], [place], [place] [address]  Hepatic function panel     Status: Abnormal   Collection Time: 05/14/19  2:06 PM  Result Value Ref Range   Total Protein 7.0 6.5 - 8.1 g/dL   Albumin 2.7 (L) 3.5 - 5.0 g/dL   AST 18 15 - 41 U/L   ALT 15 0 - 44 U/L   Alkaline Phosphatase 1,167 (H) 38 - 126 U/L   Total Bilirubin 0.9 0.3 - 1.2 mg/dL   Bilirubin, Direct 0.1 0.0 - 0.2 mg/dL   Indirect Bilirubin 0.8 0.3 - 0.9 mg/dL    Comment: Performed at [facility] [address], [place], [place] [address]  Magnesium     Status: None   Collection Time: 05/14/19  2:06 PM  Result Value Ref Range   Magnesium 1.7 1.7 - 2.4 mg/dL    Comment: Performed at [facility], [place] [address] [place], [place] [address]  Respiratory Panel by RT PCR (Flu A&B, Covid) - Nasopharyngeal Swab     Status: None   Collection Time: 05/14/19  2:45 PM   Specimen: Nasopharyngeal Swab  Result Value Ref Range   SARS Coronavirus 2 by RT PCR NEGATIVE NEGATIVE    Comment: (NOTE) SARS-CoV-2 target nucleic acids are NOT DETECTED. The SARS-CoV-2 RNA is generally detectable in upper respiratoy specimens during the acute phase of infection. The lowest concentration of SARS-CoV-2 viral copies this assay can detect is 131 copies/mL. A negative result does not preclude SARS-Cov-2 infection and should not be used as the sole basis for treatment or other patient management decisions. A negative result may occur with  improper specimen collection/handling, submission of specimen other than nasopharyngeal swab, presence of viral mutation(s) within the areas targeted by this assay, and inadequate number of viral copies (<131 copies/mL). A negative result must be combined with clinical observations, patient history, and epidemiological information. The expected result is Negative. Fact Sheet for Patients:   PinkCheek.be Fact Sheet for Healthcare Providers:  GravelBags.it This test is not yet ap proved or cleared by the [place] FDA and  has been authorized for detection and/or diagnosis of SARS-CoV-2 by FDA under an Emergency Use Authorization (EUA). This EUA will remain  in effect (meaning this test can be used) for the duration of the COVID-19 declaration under Section 564(b)(1) of the Act, 21 U.S.C. section 360bbb-3(b)(1), unless the authorization is terminated or revoked sooner.    Influenza A by PCR NEGATIVE NEGATIVE   Influenza B by PCR NEGATIVE NEGATIVE    Comment: (NOTE) The Xpert Xpress SARS-CoV-2/FLU/RSV assay is intended as an aid in  the diagnosis of influenza from Nasopharyngeal swab specimens and  should not be used as a sole basis for treatment. Nasal washings and  aspirates are unacceptable for Xpert Xpress SARS-CoV-2/FLU/RSV  testing. Fact Sheet for Patients: PinkCheek.be Fact Sheet for Healthcare Providers: GravelBags.it This test is not yet approved or cleared by the [place] FDA and  has been authorized for detection and/or diagnosis of SARS-CoV-2 by  FDA under an Emergency Use Authorization (EUA). This EUA will remain  in effect (meaning this test can be used) for the duration of the  Covid-19 declaration under Section 564(b)(1) of the Act, 21  U.S.C. section 360bbb-3(b)(1), unless the authorization is  terminated or revoked. Performed at [facility], [place] [address], [place], [place] [address]    No results found.  Pending Labs Unresulted Labs (From admission, onward)    Start     Ordered   05/15/19 0500  Renal function panel  Tomorrow morning,   R     05/14/19 1503   05/15/19 0500  Magnesium  Tomorrow morning,   R     05/14/19 1503          Vitals/Pain Today's Vitals   05/14/19 1300 05/14/19 1330 05/14/19 1400  05/14/19 1445  BP: 103/75 116/63 129/80 116/82  Pulse: (!) 59 (!) 39  86  Resp: (!) 21 15 (!) 21 19  Temp:      TempSrc:      SpO2: 97% 97%  99%  Weight:      Height:      PainSc:        Isolation Precautions No active isolations  Medications Medications  heparin injection 5,000 Units (5,000 Units Subcutaneous Given 05/14/19 1604)  sodium chloride flush (NS) 0.9 % injection 3 mL (has no administration in time range)  acetaminophen (TYLENOL) tablet 650 mg (has no administration in time range)    Or  acetaminophen (TYLENOL) suppository 650 mg (has no administration in time range)  bicalutamide (CASODEX) tablet 50 mg (has no administration in time range)  furosemide (LASIX) tablet 80 mg (has no administration in time range)  gemfibrozil (LOPID) tablet 600 mg (has no administration in time range)  hydrALAZINE (APRESOLINE) tablet 50 mg (has no administration in time range)  isosorbide mononitrate (IMDUR) 24 hr tablet 60 mg (has no administration in time range)  metoprolol tartrate (LOPRESSOR) tablet 25 mg (has no administration in time range)  amitriptyline (ELAVIL) tablet 100 mg (has no administration in time range)  DULoxetine (CYMBALTA) DR capsule 30 mg (has no administration in time range)  levothyroxine (SYNTHROID) tablet 75 mcg (has no administration in time range)  ferric citrate (AURYXIA) tablet 420 mg (has no administration in time range)  insulin aspart (novoLOG) injection 0-15 Units (has no administration in time range)  calcium gluconate 1 g/ 50 mL sodium chloride IVPB (0 mg Intravenous Stopped 05/14/19 1524)  sodium bicarbonate injection 50 mEq (50 mEq Intravenous Given 05/14/19 1446)    Mobility walks with device High fall risk   Focused Assessments Cardiac Assessment Handoff:  Cardiac Rhythm: Atrial fibrillation No results found for: CKTOTAL, CKMB,  CKMBINDEX, TROPONINI No results found for: DDIMER Does the Patient currently have chest pain? No       R Recommendations: See Admitting Provider Note  Report given to:   Additional Notes:

## 2019-05-14 NOTE — ED Triage Notes (Signed)
Pt brought in by EMS from dialysis, pt schedule Tues/Thurs/Sat. Per pt, staff noticed pt had an irregular heart rate and wanted him evaluated. Per EMS pt c/o generalized weakness and was sent out prior to receiving his tx today. Pt denies CP, SOB, fevers, NVD.

## 2019-05-14 NOTE — Consult Note (Signed)
Renal Service Consult Note [facility]  Lynwood Kubisiak 05/14/2019 Sol Blazing Requesting Physician:  Dr Kathrynn Humble   Reason for Consult:  ESRD pt w/ gen weakness, low Ca and irregular heartbeat  HPI: The patient is a 63 y.o. year-old with hx of HTN, HL, prostate ca, DM2 on insulin , CVA and ESRD on HD TTS in . Pt went to HD this am and was c/o generalized weakness and didn't feel good so was sent to the ED.  In ED eval shows very low Ca++ of 5.0, K 5.5 and stable BP.  EKG showed afib w/ VR 80's.  Pt is being admitted and getting IV Ca in ED for symptomatic hypocalcemia. Asked to see for dialysis.    Patient is in ED room, no c/o's at this time.  Main issue has been fatigue/ gen'd weakness for last 1-2 days.  No fevers, cough, SOB or CP, no abd pain, no n/v/d. He is on Tum's at home started about 1-2 mos ago, 2 between meals and Tum's w/ meals. Says he has been taking these.  No hx of any thyroid or parathyroid surgery that he knows of.  Last PTH in October at OP HD unit was 450 range.     ROS  denies CP  no joint pain   no HA  no blurry vision  no rash  no diarrhea  no nausea/ vomiting   Past Medical History  Past Medical History:  Diagnosis Date  . CVA (cerebral vascular accident) ([place]) 2010  . Diabetes mellitus with end-stage renal disease ([place])   . Dyslipidemia   . Essential hypertension   . Hypothyroidism (acquired)   . OSA on CPAP    Past Surgical History  Past Surgical History:  Procedure Laterality Date  . APPENDECTOMY    . TRANSURETHRAL RESECTION OF BLADDER TUMOR N/A 01/07/2019   Procedure: CYSTOSCOPY/ EVEACUATION CLOT;  Surgeon: Bjorn Loser, MD;  Location: [facility];  Service: Urology;  Laterality: N/A;   Family History No family history on file. Social History  reports that he quit smoking about 23 years ago. He has never used smokeless tobacco. He reports that he does not drink alcohol or use drugs. Allergies  Allergies  Allergen  Reactions  . Gabapentin Swelling   Home medications Prior to Admission medications   Medication Sig Start Date End Date Taking? Authorizing Provider  amitriptyline (ELAVIL) 100 MG tablet Take 100 mg by mouth at bedtime. 08/22/16   [provider]  atorvastatin (LIPITOR) 40 MG tablet Take 40 mg by mouth daily. 11/02/17   [provider]  DULoxetine (CYMBALTA) 30 MG capsule Take 30 mg by mouth daily. 12/13/18   [provider]  ferric citrate (AURYXIA) 1 GM 210 MG(Fe) tablet Take 420 mg by mouth 3 (three) times daily with meals. 12/22/18   [provider]  furosemide (LASIX) 80 MG tablet Take 80 mg by mouth 4 (four) times a week. Mondays, wednesdays, fridays, and sundays 12/30/18   [provider]  gemfibrozil (LOPID) 600 MG tablet Take 600 mg by mouth 2 (two) times daily. 01/05/19   [provider]  glipiZIDE (GLUCOTROL) 10 MG tablet Take 10 mg by mouth daily. 11/30/18   [provider]  hydrALAZINE (APRESOLINE) 100 MG tablet Take 50 mg by mouth 3 (three) times daily. 11/15/18   [provider]  Insulin Glargine (LANTUS SOLOSTAR) 100 UNIT/ML Solostar Pen Inject 40 Units into the skin daily as needed. 04/13/14   [provider]  isosorbide mononitrate (IMDUR) 60 MG 24 hr tablet Take 60 mg by mouth daily. 12/13/18   [provider]  levothyroxine (SYNTHROID) 50 MCG tablet Take 50 mcg by mouth daily. 07/10/17   [provider]  lidocaine-prilocaine (EMLA) cream Apply 1 application topically See admin instructions. Apply small amount to access site 1-2 hours before dialysis, cover with occlusive dressing (saran wrap) 12/15/18   [provider]  LINZESS 72 MCG capsule Take 72 mcg by mouth daily. 12/30/18   [provider]  metoprolol tartrate (LOPRESSOR) 25 MG tablet Take 25 mg by mouth daily. 11/25/18   [provider]  NIFEdipine (ADALAT CC) 90 MG 24 hr tablet Take 90 mg by mouth daily before  breakfast. 30 minutes before breakfast 10/08/18   [provider]   Liver Function Tests No results for input(s): AST, ALT, ALKPHOS, BILITOT, PROT, ALBUMIN in the last 168 hours. No results for input(s): LIPASE, AMYLASE in the last 168 hours. CBC Recent Labs  Lab 05/14/19 1257  WBC 7.9  HGB 8.0*  HCT 26.1*  MCV 94.6  PLT 916   Basic Metabolic Panel Recent Labs  Lab 05/14/19 1257  NA 135  K 5.5*  CL 102  CO2 19*  GLUCOSE 226*  BUN 72*  CREATININE 5.15*  CALCIUM 5.0*   Iron/TIBC/Ferritin/ %Sat No results found for: IRON, TIBC, FERRITIN, IRONPCTSAT  Vitals:   05/14/19 1247 05/14/19 1300 05/14/19 1330 05/14/19 1400  BP:  103/75 116/63 129/80  Pulse:  (!) 59 (!) 39   Resp:  (!) 21 15 (!) 21  Temp:      TempSrc:      SpO2:  97% 97%   Weight: 114 kg     Height: 6' (1.829 m)       Exam Gen pleasant obese WM not in distress, calm, no jerking No rash, cyanosis or gangrene Sclera anicteric, throat clear  No jvd or bruits Chest clear bilat to bases, no rales/ wheezing RRR no MRG Abd soft ntnd no mass or ascites +bs obese GU normal male MS no joint effusions or deformity Ext 1+ pretib LE edema, no wounds or ulcers Neuro is alert, Ox 3 , nf, no cramping/ twitching or fasciculations    Home meds:  - furosemide 80 4d per wk/ metoprolol 25 / nifedipine 90 / hydralazine 50 tid  - amitriptyline 100/ duloxetine 30/ percocet prn qid  - insulin glargine 40/ glipizide 5- 10 qd  - ferric citrate ac/ euthyrox 75 qd  - atorvastatin 40 / gemfibrozil 600 bid/ isosorbide dinitrate 60  - leuprolice 45mg  q 49mo/ bicalutamide 50 qd    Outpt HD: TTS [facility]  4h 12min  400/700  115kg  2K/3.5Ca bath  Heparin none   LFA AVF  - vit D 4ug tiw  - venofer 100 / hd     Assessment/ Plan: 1. Hypocalcemia - check PTH and vit D level. Phos is 6-7 range at OP HD. Not sure cause, no hx thyroid/ parathyroid surgery, recent onset 1-2 mos ago per the patient. Is on high doses or po vit D,  high ca bath and tums 1gm qid at home, supposed to be taking all of that. Getting IV Ca++ per ED which is appropriate.  Resume po tums here, f/u level in am.  Cont high Ca bath and vit D po.  2. ESRD - on HD TTS. Has not missed dialysis. Will plan HD tonight or tomorrow. [employer] for [employer] 3. HTN/ vol - is up 3-4 kg  by wts, BP's are okay.  4. DM2 on insulin 5. Prostate cancer - f/b Dr Tresa Moore in [place] 6. HL 7. Depression      Kelly Splinter  MD 05/14/2019, 2:35 PM

## 2019-05-14 NOTE — ED Provider Notes (Addendum)
[facility] EMERGENCY DEPARTMENT Provider Note   CSN: [id number] Arrival date & time: 05/14/19  1209     History Chief Complaint  Patient presents with  . Weakness    Richard Hall is a 63 y.o. male.  Richard Hall is a 63 y.o. male with a history of hypertension, hyperlipidemia, diabetes, CVA, ESRD on hemodialysis, A. fib, hypothyroidism, prostate cancer with chronic indwelling Foley catheter, who presents to the emergency department today via EMS from dialysis for evaluation of lightheadedness and generalized weakness.  Patient states that when he woke up this morning he felt a bit lightheaded but did not have any syncopal episodes, he went to dialysis this morning on his usual schedule and told him that he was feeling a bit lightheaded so they checked his blood pressure and noted that it was 90 systolic, they also noted that he had an irregular heart rate, patient was unsure of history of A. fib but it is previously documented in his chart and noted on previous EKGs here.  He denies any chest pain or shortness of breath.  No abdominal pain, nausea or vomiting and states he has had normal p.o. intake recently.  States his dry weight for dialysis is 114 kg.  He has a chronic indwelling Foley catheter that has been in place for 3 months due to prostate cancer and he denies any pain or change in urinary output recently, no hematuria. He denies any blood in the stool or dark black stools.  Did have near syncopal episode back in August and was noted to have symptomatic anemia related to hematuria, received blood transfusions and cystoscopy with evaluation from urology.  Patient is not on any blood thinners.        Past Medical History:  Diagnosis Date  . CVA (cerebral vascular accident) ([place]) 2010  . Diabetes mellitus with end-stage renal disease ([place])   . Dyslipidemia   . Essential hypertension   . Hypothyroidism (acquired)   . OSA on CPAP     Patient Active Problem  List   Diagnosis Date Noted  . Bilateral leg edema 01/28/2019  . Symptomatic anemia 01/05/2019  . Gross hematuria 01/05/2019  . Gastrointestinal hemorrhage with melena 01/05/2019  . OSA on CPAP   . Hypothyroidism (acquired)   . Essential hypertension   . Dyslipidemia   . Diabetes mellitus with end-stage renal disease ([place])   . Acute blood loss anemia   . ESRD on hemodialysis ([place]) 08/13/2018  . ESRF (end stage renal failure) ([place]) 06/07/2018  . Chronic diastolic congestive heart failure ([place]) 02/09/2018  . Anemia, chronic renal failure, stage 4 (severe) ([place]) 11/09/2017  . Chronic respiratory failure with hypoxia and hypercapnia ([place]) 11/09/2017  . Iron deficiency anemia 10/28/2017  . History of atrial fibrillation 10/07/2017  . Morbid obesity with BMI of 40.0-44.9, adult ([place]) 07/14/2016  . Diabetic peripheral neuropathy associated with type 2 diabetes mellitus ([place]) 10/10/2015  . Allergy 04/30/2015  . Bronchitis 04/30/2015  . ED (erectile dysfunction) of organic origin 04/30/2015  . Lipoma of back 04/30/2015  . Microalbuminuria 04/30/2015  . Osteoarthritis 04/30/2015  . Microalbuminuria due to type 2 diabetes mellitus ([place]) 01/09/2014  . Chronic kidney disease (CKD), stage III (moderate) 07/22/2013  . Acute ill-defined cerebrovascular disease 04/04/2013  . Dizziness 04/04/2013  . Hypercholesterolemia 04/04/2013  . Hypoxemia 04/04/2013  . Insomnia 04/04/2013  . Periodic limb movement disorder 04/04/2013  . Diplopia 06/07/2012  . Personal history of transient ischemic attack (TIA), and cerebral infarction without  residual deficits 05/19/2011    Past Surgical History:  Procedure Laterality Date  . APPENDECTOMY    . TRANSURETHRAL RESECTION OF BLADDER TUMOR N/A 01/07/2019   Procedure: CYSTOSCOPY/ EVEACUATION CLOT;  Surgeon: Bjorn Loser, MD;  Location: [facility] ORS;  Service: Urology;  Laterality: N/A;       No family history on file.  Social History   Tobacco Use  .  Smoking status: Former Smoker    Quit date: 1997    Years since quitting: 23.9  . Smokeless tobacco: Never Used  Substance Use Topics  . Alcohol use: Never  . Drug use: Never    Home Medications Prior to Admission medications   Medication Sig Start Date End Date Taking? Authorizing Provider  amitriptyline (ELAVIL) 100 MG tablet Take 100 mg by mouth at bedtime. 08/22/16   [provider]  atorvastatin (LIPITOR) 40 MG tablet Take 40 mg by mouth daily. 11/02/17   [provider]  DULoxetine (CYMBALTA) 30 MG capsule Take 30 mg by mouth daily. 12/13/18   [provider]  ferric citrate (AURYXIA) 1 GM 210 MG(Fe) tablet Take 420 mg by mouth 3 (three) times daily with meals. 12/22/18   [provider]  furosemide (LASIX) 80 MG tablet Take 80 mg by mouth 4 (four) times a week. Mondays, wednesdays, fridays, and sundays 12/30/18   [provider]  gemfibrozil (LOPID) 600 MG tablet Take 600 mg by mouth 2 (two) times daily. 01/05/19   [provider]  glipiZIDE (GLUCOTROL) 10 MG tablet Take 10 mg by mouth daily. 11/30/18   [provider]  hydrALAZINE (APRESOLINE) 100 MG tablet Take 50 mg by mouth 3 (three) times daily. 11/15/18   [provider]  Insulin Glargine (LANTUS SOLOSTAR) 100 UNIT/ML Solostar Pen Inject 40 Units into the skin daily as needed. 04/13/14   [provider]  isosorbide mononitrate (IMDUR) 60 MG 24 hr tablet Take 60 mg by mouth daily. 12/13/18   [provider]  levothyroxine (SYNTHROID) 50 MCG tablet Take 50 mcg by mouth daily. 07/10/17   [provider]  lidocaine-prilocaine (EMLA) cream Apply 1 application topically See admin instructions. Apply small amount to access site 1-2 hours before dialysis, cover with occlusive dressing (saran wrap) 12/15/18   [provider]  LINZESS 72 MCG capsule Take 72 mcg by mouth daily. 12/30/18   [provider]  metoprolol tartrate (LOPRESSOR) 25  MG tablet Take 25 mg by mouth daily. 11/25/18   [provider]  NIFEdipine (ADALAT CC) 90 MG 24 hr tablet Take 90 mg by mouth daily before breakfast. 30 minutes before breakfast 10/08/18   [provider]    Allergies    Gabapentin  Review of Systems   Review of Systems  Constitutional: Positive for fatigue. Negative for chills and fever.  HENT: Negative.   Eyes: Negative for visual disturbance.  Respiratory: Negative for cough and shortness of breath.   Cardiovascular: Negative for chest pain and leg swelling.  Gastrointestinal: Negative for abdominal pain, nausea and vomiting.  Genitourinary: Negative for dysuria, flank pain and hematuria.  Musculoskeletal: Negative for arthralgias and myalgias.  Skin: Negative for color change and rash.  Neurological: Positive for weakness (Generalized) and light-headedness. Negative for dizziness, numbness and headaches.    Physical Exam Updated Vital Signs BP 111/72   Pulse 83   Temp 97.6 F (36.4 C) (Oral)   Resp 16   Ht 6' (1.829 m)   Wt 117.9 kg   SpO2 97%  BMI 35.26 kg/m   Physical Exam Vitals and nursing note reviewed.  Constitutional:      General: He is not in acute distress.    Appearance: Normal appearance. He is well-developed. He is not ill-appearing or diaphoretic.     Comments: Well-appearing and in no distress  HENT:     Head: Normocephalic and atraumatic.     Mouth/Throat:     Mouth: Mucous membranes are moist.     Pharynx: Oropharynx is clear.  Eyes:     General:        Right eye: No discharge.        Left eye: No discharge.     Pupils: Pupils are equal, round, and reactive to light.  Cardiovascular:     Rate and Rhythm: Normal rate. Rhythm irregular.     Heart sounds: Normal heart sounds. No murmur. No friction rub. No gallop.      Comments: Irregular rhythm but rate controlled Pulmonary:     Effort: Pulmonary effort is normal. No respiratory distress.     Breath sounds: Normal breath  sounds. No wheezing or rales.     Comments: Respirations equal and unlabored, patient able to speak in full sentences, lungs clear to auscultation bilaterally Abdominal:     General: Bowel sounds are normal. There is no distension.     Palpations: Abdomen is soft. There is no mass.     Tenderness: There is no abdominal tenderness. There is no guarding.     Comments: Abdomen soft, nondistended, nontender to palpation in all quadrants without guarding or peritoneal signs  Musculoskeletal:        General: No deformity.     Cervical back: Neck supple.     Comments: Bilateral lower extremities without significant edema  Skin:    General: Skin is warm and dry.     Capillary Refill: Capillary refill takes less than 2 seconds.  Neurological:     Mental Status: He is alert.     Coordination: Coordination normal.     Comments: Speech is clear, able to follow commands CN III-XII intact Normal strength in upper and lower extremities bilaterally including dorsiflexion and plantar flexion, strong and equal grip strength Sensation normal to light and sharp touch Moves extremities without ataxia, coordination intact  Psychiatric:        Mood and Affect: Mood normal.        Behavior: Behavior normal.     ED Results / Procedures / Treatments   Labs (all labs ordered are listed, but only abnormal results are displayed) Labs Reviewed  CBC - Abnormal; Notable for the following components:      Result Value   RBC 2.76 (*)    Hemoglobin 8.0 (*)    HCT 26.1 (*)    RDW 16.7 (*)    nRBC 0.8 (*)    All other components within normal limits  BASIC METABOLIC PANEL - Abnormal; Notable for the following components:   Potassium 5.5 (*)    CO2 19 (*)    Glucose, Bld 226 (*)    BUN 72 (*)    Creatinine, Ser 5.15 (*)    Calcium 5.0 (*)    GFR calc non Af Amer 11 (*)    GFR calc Af Amer 13 (*)    All other components within normal limits  RESPIRATORY PANEL BY RT PCR (FLU A&B, COVID)  HEPATIC FUNCTION  PANEL  MAGNESIUM    EKG EKG Interpretation  Date/Time:  Saturday May 14 2019  12:32:54 EST Ventricular Rate:  86 PR Interval:    QRS Duration: 125 QT Interval:  487 QTC Calculation: 562 R Axis:   160 Text Interpretation: Atrial fibrillation Prolonged PR interval Right atrial enlargement IVCD, consider atypical RBBB No acute changes Confirmed by Varney Biles [phone]) on 05/14/2019 12:37:06 PM   Radiology No results found.  Procedures .Critical Care Performed by: Jacqlyn Larsen, PA-C Authorized by: Jacqlyn Larsen, PA-C   Critical care provider statement:    Critical care time (minutes):  45   Critical care was necessary to treat or prevent imminent or life-threatening deterioration of the following conditions:  Metabolic crisis (Hyperkalemia and severe hypocalcemia)   Critical care was time spent personally by me on the following activities:  Discussions with consultants, evaluation of patient's response to treatment, examination of patient, ordering and performing treatments and interventions, ordering and review of laboratory studies, ordering and review of radiographic studies, pulse oximetry, re-evaluation of patient's condition, obtaining history from patient or surrogate and review of old charts   (including critical care time)  Medications Ordered in ED Medications  calcium gluconate 1 g/ 50 mL sodium chloride IVPB (1,000 mg Intravenous New Bag/Given 05/14/19 1414)  sodium bicarbonate injection 50 mEq (has no administration in time range)    ED Course  I have reviewed the triage vital signs and the nursing notes.  Pertinent labs & imaging results that were available during my care of the patient were reviewed by me and considered in my medical decision making (see chart for details).  Clinical Course as of May 13 1445  Sat May 13, 2657  5565 63 year old male arrives from dialysis reporting generalized weakness and lightheadedness this morning, dialysis noted  systolic blood pressure of 90 and irregular heart rate, but patient has history of A. fib noted in chart, rate controlled on arrival.   [KF]  1315 Hemoglobin is at baseline, doubt anemia as cause for patient's weakness and lightheadedness today, he has no leukocytosis to suggest infectious etiology.  CBC(!) [KF]  1400 Significant electrolyte derangements, calcium of 5.0, potassium of 5.5, CO2 of 19.  Creatinine of 5.15 with BUN of 72. Given significant hypocalcemia and hyperkalemia calcium gluconate and sodium bicarb given especially in the setting of patient's A. fib with intraventricular conduction delay noted on EKG today and some bradycardia.  Will consult nephrology, patient will require admission given significant electrolyte derangements.  Basic metabolic panel(!!) [KF]  9476 CONSULT: Case discussed with Dr. Jonnie Finner with nephrology who will plan to see patient in consult, will likely plan for dialysis later today, request rapid Covid test   [KF]  1440 ADMIT: Case discussed with internal medicine teaching service who will see and admit the patient.   [KF]    Clinical Course User Index [KF] Janet Berlin   MDM Rules/Calculators/A&P  63 year old male presents with generalized weakness and lightheadedness, noted to be in A. fib rate controlled on arrival, dialysis had reported systolic blood pressure of 90 but BPs have been stable here in the ED.  Lab work showed significant hypocalcemia and hyperkalemia which may be contributing to patient's generalized weakness and lightheadedness, patient has had some bradycardia with intraventricular conduction delay noted on EKG which could be due to electrolyte derangements.  Treated with calcium gluconate and sodium bicarb, nephrology consulted and will see patient and set up dialysis.  Patient will be admitted to internal medicine teaching service for treatment of electrolyte derangements.  Patient discussed with Dr. Kathrynn Humble, who saw patient as  well and agrees with plan.   Final Clinical Impression(s) / ED Diagnoses Final diagnoses:  Hypocalcemia  Hyperkalemia  General weakness  Atrial fibrillation, unspecified type ([place])  ESRD on hemodialysis [facility])    Rx / DC Orders ED Discharge Orders    None       Jacqlyn Larsen, PA-C 05/14/19 1452    Jacqlyn Larsen, PA-C 05/14/19 [place], Ankit, MD 05/14/19 2105

## 2019-05-15 DIAGNOSIS — E039 Hypothyroidism, unspecified: Secondary | ICD-10-CM | POA: Diagnosis present

## 2019-05-15 DIAGNOSIS — E559 Vitamin D deficiency, unspecified: Secondary | ICD-10-CM

## 2019-05-15 DIAGNOSIS — Z9889 Other specified postprocedural states: Secondary | ICD-10-CM | POA: Diagnosis not present

## 2019-05-15 DIAGNOSIS — Z992 Dependence on renal dialysis: Secondary | ICD-10-CM

## 2019-05-15 DIAGNOSIS — Z87891 Personal history of nicotine dependence: Secondary | ICD-10-CM | POA: Diagnosis not present

## 2019-05-15 DIAGNOSIS — C61 Malignant neoplasm of prostate: Secondary | ICD-10-CM

## 2019-05-15 DIAGNOSIS — C7951 Secondary malignant neoplasm of bone: Secondary | ICD-10-CM | POA: Diagnosis present

## 2019-05-15 DIAGNOSIS — E875 Hyperkalemia: Secondary | ICD-10-CM | POA: Diagnosis present

## 2019-05-15 DIAGNOSIS — G893 Neoplasm related pain (acute) (chronic): Secondary | ICD-10-CM | POA: Diagnosis not present

## 2019-05-15 DIAGNOSIS — Z20828 Contact with and (suspected) exposure to other viral communicable diseases: Secondary | ICD-10-CM | POA: Diagnosis present

## 2019-05-15 DIAGNOSIS — Z79899 Other long term (current) drug therapy: Secondary | ICD-10-CM

## 2019-05-15 DIAGNOSIS — I480 Paroxysmal atrial fibrillation: Secondary | ICD-10-CM | POA: Diagnosis present

## 2019-05-15 DIAGNOSIS — R739 Hyperglycemia, unspecified: Secondary | ICD-10-CM | POA: Diagnosis not present

## 2019-05-15 DIAGNOSIS — D649 Anemia, unspecified: Secondary | ICD-10-CM | POA: Diagnosis not present

## 2019-05-15 DIAGNOSIS — Z8673 Personal history of transient ischemic attack (TIA), and cerebral infarction without residual deficits: Secondary | ICD-10-CM

## 2019-05-15 DIAGNOSIS — R42 Dizziness and giddiness: Secondary | ICD-10-CM

## 2019-05-15 DIAGNOSIS — N186 End stage renal disease: Secondary | ICD-10-CM

## 2019-05-15 DIAGNOSIS — W19XXXA Unspecified fall, initial encounter: Secondary | ICD-10-CM | POA: Diagnosis not present

## 2019-05-15 DIAGNOSIS — Z8546 Personal history of malignant neoplasm of prostate: Secondary | ICD-10-CM | POA: Diagnosis not present

## 2019-05-15 DIAGNOSIS — G4733 Obstructive sleep apnea (adult) (pediatric): Secondary | ICD-10-CM | POA: Diagnosis present

## 2019-05-15 DIAGNOSIS — Z960 Presence of urogenital implants: Secondary | ICD-10-CM

## 2019-05-15 DIAGNOSIS — W010XXA Fall on same level from slipping, tripping and stumbling without subsequent striking against object, initial encounter: Secondary | ICD-10-CM | POA: Diagnosis present

## 2019-05-15 DIAGNOSIS — F329 Major depressive disorder, single episode, unspecified: Secondary | ICD-10-CM | POA: Diagnosis present

## 2019-05-15 DIAGNOSIS — W1830XA Fall on same level, unspecified, initial encounter: Secondary | ICD-10-CM | POA: Diagnosis not present

## 2019-05-15 DIAGNOSIS — I4891 Unspecified atrial fibrillation: Secondary | ICD-10-CM | POA: Diagnosis not present

## 2019-05-15 DIAGNOSIS — D62 Acute posthemorrhagic anemia: Secondary | ICD-10-CM | POA: Diagnosis not present

## 2019-05-15 DIAGNOSIS — E1122 Type 2 diabetes mellitus with diabetic chronic kidney disease: Secondary | ICD-10-CM

## 2019-05-15 DIAGNOSIS — S72452A Displaced supracondylar fracture without intracondylar extension of lower end of left femur, initial encounter for closed fracture: Secondary | ICD-10-CM | POA: Diagnosis present

## 2019-05-15 DIAGNOSIS — D631 Anemia in chronic kidney disease: Secondary | ICD-10-CM | POA: Diagnosis present

## 2019-05-15 DIAGNOSIS — R338 Other retention of urine: Secondary | ICD-10-CM | POA: Diagnosis present

## 2019-05-15 DIAGNOSIS — E872 Acidosis: Secondary | ICD-10-CM

## 2019-05-15 DIAGNOSIS — Z7901 Long term (current) use of anticoagulants: Secondary | ICD-10-CM | POA: Diagnosis not present

## 2019-05-15 DIAGNOSIS — I120 Hypertensive chronic kidney disease with stage 5 chronic kidney disease or end stage renal disease: Secondary | ICD-10-CM | POA: Diagnosis present

## 2019-05-15 DIAGNOSIS — M84552A Pathological fracture in neoplastic disease, left femur, initial encounter for fracture: Secondary | ICD-10-CM | POA: Diagnosis present

## 2019-05-15 DIAGNOSIS — S72402A Unspecified fracture of lower end of left femur, initial encounter for closed fracture: Secondary | ICD-10-CM | POA: Diagnosis not present

## 2019-05-15 DIAGNOSIS — D638 Anemia in other chronic diseases classified elsewhere: Secondary | ICD-10-CM | POA: Diagnosis not present

## 2019-05-15 DIAGNOSIS — E785 Hyperlipidemia, unspecified: Secondary | ICD-10-CM | POA: Diagnosis present

## 2019-05-15 LAB — COMPREHENSIVE METABOLIC PANEL
ALT: 16 U/L (ref 0–44)
AST: 21 U/L (ref 15–41)
Albumin: 2.6 g/dL — ABNORMAL LOW (ref 3.5–5.0)
Alkaline Phosphatase: 1274 U/L — ABNORMAL HIGH (ref 38–126)
Anion gap: 14 (ref 5–15)
BUN: 36 mg/dL — ABNORMAL HIGH (ref 8–23)
CO2: 24 mmol/L (ref 22–32)
Calcium: 6.6 mg/dL — ABNORMAL LOW (ref 8.9–10.3)
Chloride: 95 mmol/L — ABNORMAL LOW (ref 98–111)
Creatinine, Ser: 3.38 mg/dL — ABNORMAL HIGH (ref 0.61–1.24)
GFR calc Af Amer: 21 mL/min — ABNORMAL LOW (ref >60)
GFR calc non Af Amer: 18 mL/min — ABNORMAL LOW (ref >60)
Glucose, Bld: 295 mg/dL — ABNORMAL HIGH (ref 70–99)
Potassium: 4 mmol/L (ref 3.5–5.1)
Sodium: 133 mmol/L — ABNORMAL LOW (ref 135–145)
Total Bilirubin: 0.8 mg/dL (ref 0.3–1.2)
Total Protein: 7.1 g/dL (ref 6.5–8.1)

## 2019-05-15 LAB — GLUCOSE, CAPILLARY
Glucose-Capillary: 70 mg/dL (ref 70–99)
Glucose-Capillary: 82 mg/dL (ref 70–99)
Glucose-Capillary: 116 mg/dL — ABNORMAL HIGH (ref 70–99)
Glucose-Capillary: 283 mg/dL — ABNORMAL HIGH (ref 70–99)
Glucose-Capillary: 105 mg/dL — ABNORMAL HIGH (ref 70–99)

## 2019-05-15 LAB — RENAL FUNCTION PANEL
Albumin: 2.6 g/dL — ABNORMAL LOW (ref 3.5–5.0)
Anion gap: 18 — ABNORMAL HIGH (ref 5–15)
BUN: 70 mg/dL — ABNORMAL HIGH (ref 8–23)
CO2: 19 mmol/L — ABNORMAL LOW (ref 22–32)
Calcium: 4.9 mg/dL — CL (ref 8.9–10.3)
Chloride: 104 mmol/L (ref 98–111)
Creatinine, Ser: 5 mg/dL — ABNORMAL HIGH (ref 0.61–1.24)
GFR calc Af Amer: 13 mL/min — ABNORMAL LOW (ref >60)
GFR calc non Af Amer: 11 mL/min — ABNORMAL LOW (ref >60)
Glucose, Bld: 67 mg/dL — ABNORMAL LOW (ref 70–99)
Phosphorus: 7.8 mg/dL — ABNORMAL HIGH (ref 2.5–4.6)
Potassium: 4.1 mmol/L (ref 3.5–5.1)
Sodium: 141 mmol/L (ref 135–145)

## 2019-05-15 LAB — PARATHYROID HORMONE, INTACT (NO CA): PTH: 152 pg/mL — ABNORMAL HIGH (ref 15–65)

## 2019-05-15 LAB — MAGNESIUM: Magnesium: 1.8 mg/dL (ref 1.7–2.4)

## 2019-05-15 MED ORDER — APIXABAN 5 MG PO TABS
5.0000 mg | ORAL_TABLET | Freq: Two times a day (BID) | ORAL | Status: DC
  Administered 2019-05-15 – 2019-05-17 (×4): 5 mg via ORAL
  Filled 2019-05-15 (×4): qty 1

## 2019-05-15 MED ORDER — HYDROMORPHONE HCL 1 MG/ML IJ SOLN: 0.5000 mg | INTRAMUSCULAR | Status: DC | PRN

## 2019-05-15 MED ORDER — CALCIUM GLUCONATE-NACL 2-0.675 GM/100ML-% IV SOLN
2.0000 g | Freq: Once | INTRAVENOUS | Status: AC
  Administered 2019-05-15: 2000 mg via INTRAVENOUS
  Filled 2019-05-15: qty 100

## 2019-05-15 NOTE — Evaluation (Signed)
Physical Therapy Evaluation Patient Details Name: Richard Hall MRN: [id number] DOB: 02/20/1956 Today's Date: 05/15/2019   History of Present Illness  Patient is a 63 year old male admitted with general weakness, dizziness. PMH to include: Metastatic prostate Ca, OSA, DM, ESRD on HD, hypocalcemia, CVA, HTN  Clinical Impression  Patient received sleeping, easily roused. Agrees to PT assessment. Reports no longer having dizziness. He is independent with bed mobility, requires supervision for sit to stand. Ambulated 200 feet with SPC and min guard. Slight unsteadiness with ambulation, reaching for walls, rails when out in hall. Decreased safety awareness. Reports he feels at his baseline. Patient will benefit from continued skilled PT while here to improve strength and balance for safe return home.      Follow Up Recommendations No PT follow up    Equipment Recommendations  None recommended by PT    Recommendations for Other Services       Precautions / Restrictions Precautions Precautions: Fall Precaution Comments: mod fall Restrictions Weight Bearing Restrictions: No      Mobility  Bed Mobility Overal bed mobility: Independent                Transfers Overall transfer level: Needs assistance Equipment used: Straight cane Transfers: Sit to/from Stand Sit to Stand: Min guard;Supervision         General transfer comment: transfers with supervision/min guard, cues to stand prior to walking to be sure he is not dizzy  Ambulation/Gait Ambulation/Gait assistance: Min guard Gait Distance (Feet): 200 Feet Assistive device: Straight cane Gait Pattern/deviations: Step-through pattern;Wide base of support Gait velocity: WNL   General Gait Details: left LE rotated outward during ambulation. Unsteady- reaching for door frames/rails for support in hallway.  Stairs            Wheelchair Mobility    Modified Rankin (Stroke Patients Only)       Balance Overall  balance assessment: Mild deficits observed, not formally tested                                           Pertinent Vitals/Pain Pain Assessment: No/denies pain    Home Living Family/patient expects to be discharged to:: Private residence Living Arrangements: Alone   Type of Home: Apartment Home Access: Level entry     Home Layout: [place]: [place] - single point;Walker - 2 wheels Additional Comments: uses SPC at baseline, reports he has walker if needed    Prior Function Level of Independence: Independent with assistive device(s)               Hand Dominance   Dominant Hand: Right    Extremity/Trunk Assessment   Upper Extremity Assessment Upper Extremity Assessment: Overall WFL for tasks assessed    Lower Extremity Assessment Lower Extremity Assessment: Overall WFL for tasks assessed    Cervical / Trunk Assessment Cervical / Trunk Assessment: Normal  Communication   Communication: No difficulties  Cognition Arousal/Alertness: Awake/alert Behavior During Therapy: Flat affect Overall Cognitive Status: Within Functional Limits for tasks assessed                                        General Comments      Exercises     Assessment/Plan    PT Assessment Patient needs continued  PT services  PT Problem List Decreased strength;Decreased mobility;Decreased activity tolerance;Decreased balance;Decreased safety awareness       PT Treatment Interventions Gait training;Therapeutic activities;Functional mobility training;Therapeutic exercise;Patient/family education    PT Goals (Current goals can be found in the Care Plan section)  Acute Rehab PT Goals Patient Stated Goal: to go home PT Goal Formulation: With patient Time For Goal Achievement: 05/22/19 Potential to Achieve Goals: Good    Frequency Min 2X/week   Barriers to discharge Decreased caregiver support      Co-evaluation                AM-PAC PT "6 Clicks" Mobility  Outcome Measure Help needed turning from your back to your side while in a flat bed without using bedrails?: None Help needed moving from lying on your back to sitting on the side of a flat bed without using bedrails?: None Help needed moving to and from a bed to a chair (including a wheelchair)?: A Little Help needed standing up from a chair using your arms (e.g., wheelchair or bedside chair)?: A Little Help needed to walk in hospital room?: A Little Help needed climbing 3-5 steps with a railing? : A Lot 6 Click Score: 19    End of Session Equipment Utilized During Treatment: Gait belt Activity Tolerance: Patient tolerated treatment well;Patient limited by fatigue Patient left: Other (comment)(seated on side of bed eating sandwich) Nurse Communication: Mobility status PT Visit Diagnosis: Unsteadiness on feet (R26.81);Muscle weakness (generalized) (M62.81)    Time: 5885-0277 PT Time Calculation (min) (ACUTE ONLY): 20 min   Charges:   PT Evaluation $PT Eval Moderate Complexity: 1 Mod PT Treatments $Gait Training: 8-22 mins        Ortha Metts, PT, GCS 05/15/19,2:51 PM

## 2019-05-15 NOTE — Progress Notes (Signed)
Report received from HD and patient returned to room.

## 2019-05-15 NOTE — Progress Notes (Signed)
[facility] Progress Note  Subjective: no new/ co's  Vitals:   05/15/19 1000 05/15/19 1016 05/15/19 1107 05/15/19 1116  BP: 133/73 (!) 156/64 119/74 134/76  Pulse: 83 88 68 64  Resp:  20 20 18   Temp:  97.6 F (36.4 C) 97.9 F (36.6 C) 97.7 F (36.5 C)  TempSrc:  Oral Oral Oral  SpO2:  98% 98% 98%  Weight:  113.6 kg    Height:        Exam: Gen pleasant obese WM not in distress, calm, no jerking No rash, cyanosis or gangrene Sclera anicteric, throat clear  No jvd or bruits Chest clear bilat to bases, no rales/ wheezing RRR no MRG Abd soft ntnd no mass or ascites +bs obese GU normal male MS no joint effusions or deformity Ext 1+ pretib LE edema, no wounds or ulcers Neuro is alert, Ox 3 , nf, no cramping/ twitching or fasciculations    Home meds:  - furosemide 80 4d per wk/ metoprolol 25 / nifedipine 90 / hydralazine 50 tid  - amitriptyline 100/ duloxetine 30/ percocet prn qid  - insulin glargine 40/ glipizide 5- 10 qd  - ferric citrate ac/ euthyrox 75 qd  - atorvastatin 40 / gemfibrozil 600 bid/ isosorbide dinitrate 60  - leuprolice 45mg  q 43mo/ bicalutamide 50 qd    Outpt HD: TTS [facility]  4h 70min  400/700  115kg  2K/3.5Ca bath  Heparin none   LFA AVF  - vit D 4ug tiw  - venofer 100 / hd     Assessment/ Plan: 1. Hypocalcemia - recent onset 1-2 mos ago per the patient. Was admitted on high doses of po vit D at HD, high ca bath and tums 1gm qid at home. Acute hypocalcemia in ESRD pt is unusual w/o recent PTX.  He does have metastatic prostate Ca I believe and it's possible that he could have been given denosumab at [facility] oncology, because it's used I believe to try and inhibit bone loss from mets, but can cause severe and prolonged hypocalcemia in ESRD pts which lasts for months. Would be worth looking into this week. Ca++ today is still low at 5.0 , 2gm IV Ca ordered will ^ to 4 gms and cont po CaCO3, high Ca bath and high dose vit D tiw.   2. ESRD - on HD TTS. Has not missed dialysis. HD today rollover from Sat 3. HTN/ vol - is up 3-4 kg by wts, BP's are okay. UF 3L today.  4. DM2 on insulin 5. Prostate cancer - f/b Dr Tresa Moore in [place] 6. HL 7. Depression      Rob Theoplis Garciagarcia 05/15/2019, 1:58 PM  Inpatient medications: . amitriptyline  100 mg Oral QHS  . apixaban  5 mg Oral BID  . bicalutamide  50 mg Oral Daily  . calcitRIOL  4 mcg Oral Once per day on Tue Thu Sat  . calcium carbonate  2 tablet Oral TID WC & HS  . Chlorhexidine Gluconate Cloth  6 each Topical Q0600  . DULoxetine  30 mg Oral Daily  . ferric citrate  420 mg Oral TID WC  . furosemide  80 mg Oral Once per day on Sun Tue Thu Sat  . gemfibrozil  600 mg Oral BID  . hydrALAZINE  50 mg Oral TID  . insulin aspart  0-15 Units Subcutaneous TID WC  . isosorbide mononitrate  60 mg Oral Daily  . levothyroxine  75 mcg Oral q morning - 10a  . metoprolol  tartrate  25 mg Oral Daily  . sodium chloride flush  3 mL Intravenous Q12H    acetaminophen **OR** acetaminophen

## 2019-05-16 LAB — BASIC METABOLIC PANEL
Anion gap: 15 (ref 5–15)
Anion gap: 16 — ABNORMAL HIGH (ref 5–15)
BUN: 49 mg/dL — ABNORMAL HIGH (ref 8–23)
BUN: 56 mg/dL — ABNORMAL HIGH (ref 8–23)
CO2: 24 mmol/L (ref 22–32)
CO2: 21 mmol/L — ABNORMAL LOW (ref 22–32)
Calcium: 6.5 mg/dL — ABNORMAL LOW (ref 8.9–10.3)
Calcium: 6.5 mg/dL — ABNORMAL LOW (ref 8.9–10.3)
Chloride: 99 mmol/L (ref 98–111)
Chloride: 99 mmol/L (ref 98–111)
Creatinine, Ser: 4.36 mg/dL — ABNORMAL HIGH (ref 0.61–1.24)
Creatinine, Ser: 4.67 mg/dL — ABNORMAL HIGH (ref 0.61–1.24)
GFR calc Af Amer: 15 mL/min — ABNORMAL LOW (ref >60)
GFR calc Af Amer: 14 mL/min — ABNORMAL LOW (ref >60)
GFR calc non Af Amer: 13 mL/min — ABNORMAL LOW (ref >60)
GFR calc non Af Amer: 12 mL/min — ABNORMAL LOW (ref >60)
Glucose, Bld: 130 mg/dL — ABNORMAL HIGH (ref 70–99)
Glucose, Bld: 172 mg/dL — ABNORMAL HIGH (ref 70–99)
Potassium: 4 mmol/L (ref 3.5–5.1)
Potassium: 4.6 mmol/L (ref 3.5–5.1)
Sodium: 138 mmol/L (ref 135–145)
Sodium: 136 mmol/L (ref 135–145)

## 2019-05-16 LAB — GLUCOSE, CAPILLARY
Glucose-Capillary: 125 mg/dL — ABNORMAL HIGH (ref 70–99)
Glucose-Capillary: 126 mg/dL — ABNORMAL HIGH (ref 70–99)
Glucose-Capillary: 149 mg/dL — ABNORMAL HIGH (ref 70–99)
Glucose-Capillary: 184 mg/dL — ABNORMAL HIGH (ref 70–99)

## 2019-05-16 MED ORDER — CHLORHEXIDINE GLUCONATE CLOTH 2 % EX PADS
6.0000 | MEDICATED_PAD | Freq: Every day | CUTANEOUS | Status: DC
  Administered 2019-05-17: 07:00:00 6 via TOPICAL

## 2019-05-16 MED ORDER — CALCIUM GLUCONATE-NACL 2-0.675 GM/100ML-% IV SOLN
2.0000 g | Freq: Once | INTRAVENOUS | Status: AC
  Administered 2019-05-16: 2000 mg via INTRAVENOUS
  Filled 2019-05-16: qty 100

## 2019-05-16 NOTE — Discharge Instructions (Signed)

## 2019-05-16 NOTE — Progress Notes (Addendum)
   Subjective:  No events overnight. Pt reports he feels well this morning. PT reports no complainsts after hemodialysis yesterday. He denies any  dizziness, chest pain, palpitations, or nausea or vomiting.   Objective:  Vital signs in last 24 hours: Vitals:   05/15/19 2038 05/16/19 0023 05/16/19 0542 05/16/19 0941  BP: 135/77 (!) 160/95 (!) 154/87 (!) 155/78  Pulse: 76 84 75 72  Resp: 18 18 18 16   Temp: 98.8 F (37.1 C) 98.1 F (36.7 C) 98.2 F (36.8 C) 98 F (36.7 C)  TempSrc: Oral Oral Oral Oral  SpO2: 99% 98% 97% 97%  Weight:   111.4 kg   Height:       Weight change: -4.335 kg  Intake/Output Summary (Last 24 hours) at 05/16/2019 1005 Last data filed at 05/16/2019 0809 Gross per 24 hour  Intake 1164.05 ml  Output 5650 ml  Net -4485.95 ml   Physical Exam Constitutional:      General: He is not in acute distress. Cardiovascular:     Rate and Rhythm: Normal rate. Rhythm irregular.     Pulses: Normal pulses.     Heart sounds: Normal heart sounds. No murmur. No friction rub. No gallop.   Genitourinary:    Comments: Foley catheter in place Neurological:     General: No focal deficit present.     Mental Status: He is alert. Mental status is at baseline.    Labs: Na 133->138, Cl 95->99, AG 15, Bicarbonate 24 Ca 6.5  Assessment/Plan:  Principal Problem:   Atrial fibrillation (HCC) Active Problems:   OSA on CPAP   Diabetes mellitus with end-stage renal disease ([place])   Dizziness   ESRD on hemodialysis ([place])   Hypocalcemia  Pt is a 63 year-old male with a PMH of ESRD on HD, atrial fibrillation, hypertension, CVA and metastatic prostate cancer who presented with an episode of dizziness and hypotension. Pt is currently hemodynamically stable. Remains hypocalcemic.   ESRD on HD:  No concerns after HD yesterday Anion gap metbaolic acidosis: Anion gap and low bicarb have resolved Vitamin D deficiency Hypocalcemia: Corrected Ca this morning: 7.6. Hypocalcemia is  improving with current treatment. Pt may have been treated with denosumab previously for his prostate cancer.  - Continue calcitriol and calcium carbonate. - 2g IV Calcium gluconate today - Will call oncology office to determine if pt received denosumab treatment - Repeat BMP in AM  Atrial fibrillation: Rates are well controlled. Pt taking metoprolol with no episodes of dizziness since. Currently on anticoagulation for primary stroke prophylaxis.  - Continue metoprolol 25 mg QD - Continue eliquis 5 mg BID - Continuous cardiac telemetry  Prostate cancer: Pt has metastatic disease to the bones. Currently on bicalutamide - Continue bicalutamide  T2DM: - Continue SSI  Hypertension: Blood pressure mildly elevated. - Continue hydralazine 50 mg TID, Imdur 60 mg QD, and metoprolol 25 mg QD  Code status: FULL CODE Diet: renal. No fluid restriction DVT for prophylaxis: eliquis Dispo: likely discharge in 1d   LOS: 1 day   Carin Primrose, Medical Student 05/16/2019, 10:05 AM

## 2019-05-16 NOTE — Plan of Care (Signed)
  Problem: Nutrition: Goal: Adequate nutrition will be maintained Outcome: Completed/Met   Problem: Coping: Goal: Level of anxiety will decrease Outcome: Completed/Met   Problem: Elimination: Goal: Will not experience complications related to bowel motility Outcome: Completed/Met Goal: Will not experience complications related to urinary retention Outcome: Completed/Met   Problem: Pain Managment: Goal: General experience of comfort will improve Outcome: Completed/Met   Problem: Safety: Goal: Ability to remain free from injury will improve Outcome: Completed/Met   Problem: Skin Integrity: Goal: Risk for impaired skin integrity will decrease Outcome: Completed/Met

## 2019-05-16 NOTE — Progress Notes (Signed)
Spoke to oncology at the [facility] in [place]. They have record of him arriving to the ER at their hospital in August 2020, but oncology did not see this pt and have no record of any cancer diagnosis for him.   Spoke to the nurse for pt's PCP, Dr. Loletta Parish. They have no record of any prostate cancer diagnosis, treatment, or referral to oncology or urology for prostate cancer. Pt was last seen in their office in June 2020 for an unrelated concern. Pt also had a phone visit on 04/18/19 for back pain, for which he was prescribed analgesics.

## 2019-05-16 NOTE — Progress Notes (Signed)
[facility] Progress Note  Subjective Had HD 12/13 with 3.5 kg UF. States he tolerated well .  Review of systems:  Denies chest pain or shortness of breath  Denies nausea or vomiting    Vitals:   05/15/19 2000 05/15/19 2038 05/16/19 0023 05/16/19 0542  BP: 125/71 135/77 (!) 160/95 (!) 154/87  Pulse: 78 76 84 75  Resp: 18 18 18 18   Temp: 98.4 F (36.9 C) 98.8 F (37.1 C) 98.1 F (36.7 C) 98.2 F (36.8 C)  TempSrc: Oral Oral Oral Oral  SpO2: 97% 99% 98% 97%  Weight:    111.4 kg  Height:        Exam: Gen pleasant obese WM not in distress, calm, no jerking Skin No rash, cyanosis  Sclera anicteric, throat clear  No jvd or bruits Chest clear bilat to bases unlabored  RRR no MRG Abd soft ntnd obese habitus MS no joint effusions or deformity Ext  no pitting LE edema Neuro is alert, Ox 3 , nf, no cramping/ twitching or fasciculations    Home meds: reported as   - furosemide 80 4d per wk/ metoprolol 25 / nifedipine 90 / hydralazine 50 tid  - amitriptyline 100/ duloxetine 30/ percocet prn qid  - insulin glargine 40/ glipizide 5- 10 qd  - ferric citrate ac/ euthyrox 75 qd  - atorvastatin 40 / gemfibrozil 600 bid/ isosorbide dinitrate 60  - leuprolice 45mg  q 28mo/ bicalutamide 50 qd    Outpt HD: T[facility]  4h 15min  400/700  115kg  2K/3.5Ca bath  Heparin none   LFA AVF  - vit D 4ug tiw  - venofer 100 / hd     Assessment/ Plan: 1. Hypocalcemia - recent onset 1-2 mos ago per the patient. Was admitted on high doses of po vit D at HD, high ca bath and tums 1gm qid at home. Acute hypocalcemia in ESRD pt is unusual w/o recent PTX.  He does have metastatic prostate Ca; given hypocalcemia is possible that he could have been given denosumab at [facility] oncology as can cause severe hypocalcemia in ESRD patients.  Would not redose if this was given.  Continue PO calcium, high Ca bath and high dose vit D tiw. Corrected calcium 7.6  Repeat calcium IV    2. ESRD -  on HD TTS.  Last tx off schedule with inpt volumes; next HD on 12/15 3. HTN - optimize volume with HD 4. DM2  Per primary team  5. Prostate cancer - f/b Dr Tresa Moore in [place] 6. HL - per primary team. Would transition off of gemfibrozil given his ESRD 7. Depression - per primary team  8. Anemia - ESRD.  No ESA in setting of malignancy for now.      Claudia Desanctis 05/16/2019 6:45 AM      Inpatient medications: . amitriptyline  100 mg Oral QHS  . apixaban  5 mg Oral BID  . bicalutamide  50 mg Oral Daily  . calcitRIOL  4 mcg Oral Once per day on Tue Thu Sat  . calcium carbonate  2 tablet Oral TID WC & HS  . Chlorhexidine Gluconate Cloth  6 each Topical Q0600  . DULoxetine  30 mg Oral Daily  . ferric citrate  420 mg Oral TID WC  . furosemide  80 mg Oral Once per day on Sun Tue Thu Sat  . gemfibrozil  600 mg Oral BID  . hydrALAZINE  50 mg Oral TID  . insulin aspart  0-15 Units Subcutaneous TID WC  . isosorbide mononitrate  60 mg Oral Daily  . levothyroxine  75 mcg Oral q morning - 10a  . metoprolol tartrate  25 mg Oral Daily  . sodium chloride flush  3 mL Intravenous Q12H    acetaminophen **OR** acetaminophen

## 2019-05-16 NOTE — Progress Notes (Signed)
OT Cancellation Note  Patient Details Name: Richard Hall MRN: [id number] DOB: Jun 30, 1955   Cancelled Treatment:    Reason Eval/Treat Not Completed: Fatigue/lethargy limiting ability to participate(pt declined OT eval due to wanting to sleep, attempted x 2)  Malka So 05/16/2019, 2:52 PM  Nestor Lewandowsky, OTR/L Acute Rehabilitation Services Pager: [phone] Office: [phone]

## 2019-05-16 NOTE — Plan of Care (Signed)
  Problem: Education: Goal: Knowledge of General Education information will improve Description: Including pain rating scale, medication(s)/side effects and non-pharmacologic comfort measures Outcome: Progressing   Problem: Clinical Measurements: Goal: Diagnostic test results will improve Outcome: Progressing   Problem: Education: Goal: Ability to verbalize understanding of medication therapies will improve Outcome: Progressing   Problem: Cardiac: Goal: Ability to achieve and maintain adequate cardiopulmonary perfusion will improve Outcome: Progressing

## 2019-05-17 ENCOUNTER — Inpatient Hospital Stay (HOSPITAL_COMMUNITY): Payer: Medicare Other

## 2019-05-17 ENCOUNTER — Encounter (HOSPITAL_COMMUNITY): Payer: Self-pay

## 2019-05-17 ENCOUNTER — Other Ambulatory Visit: Payer: Self-pay

## 2019-05-17 ENCOUNTER — Inpatient Hospital Stay (HOSPITAL_COMMUNITY)
Admission: EM | Admit: 2019-05-17 | Discharge: 2019-05-25 | Disposition: A | Discharge status: Skilled Nursing Facility | Payer: Medicare Other | Source: Home / Self Care | Attending: Internal Medicine | Admitting: Internal Medicine

## 2019-05-17 ENCOUNTER — Emergency Department (HOSPITAL_COMMUNITY): Payer: Medicare Other

## 2019-05-17 DIAGNOSIS — N186 End stage renal disease: Secondary | ICD-10-CM

## 2019-05-17 DIAGNOSIS — I4891 Unspecified atrial fibrillation: Secondary | ICD-10-CM | POA: Diagnosis present

## 2019-05-17 DIAGNOSIS — I10 Essential (primary) hypertension: Secondary | ICD-10-CM | POA: Diagnosis present

## 2019-05-17 DIAGNOSIS — C61 Malignant neoplasm of prostate: Secondary | ICD-10-CM

## 2019-05-17 DIAGNOSIS — W19XXXA Unspecified fall, initial encounter: Secondary | ICD-10-CM

## 2019-05-17 DIAGNOSIS — Z419 Encounter for procedure for purposes other than remedying health state, unspecified: Secondary | ICD-10-CM

## 2019-05-17 DIAGNOSIS — I482 Chronic atrial fibrillation, unspecified: Secondary | ICD-10-CM | POA: Diagnosis present

## 2019-05-17 DIAGNOSIS — T148XXA Other injury of unspecified body region, initial encounter: Secondary | ICD-10-CM

## 2019-05-17 DIAGNOSIS — F339 Major depressive disorder, recurrent, unspecified: Secondary | ICD-10-CM

## 2019-05-17 DIAGNOSIS — M84552A Pathological fracture in neoplastic disease, left femur, initial encounter for fracture: Secondary | ICD-10-CM

## 2019-05-17 DIAGNOSIS — Z888 Allergy status to other drugs, medicaments and biological substances status: Secondary | ICD-10-CM

## 2019-05-17 LAB — GLUCOSE, CAPILLARY
Glucose-Capillary: 147 mg/dL — ABNORMAL HIGH (ref 70–99)
Glucose-Capillary: 168 mg/dL — ABNORMAL HIGH (ref 70–99)

## 2019-05-17 LAB — BASIC METABOLIC PANEL
Anion gap: 16 — ABNORMAL HIGH (ref 5–15)
Anion gap: 15 (ref 5–15)
BUN: 57 mg/dL — ABNORMAL HIGH (ref 8–23)
BUN: 39 mg/dL — ABNORMAL HIGH (ref 8–23)
CO2: 22 mmol/L (ref 22–32)
CO2: 21 mmol/L — ABNORMAL LOW (ref 22–32)
Calcium: 6.7 mg/dL — ABNORMAL LOW (ref 8.9–10.3)
Calcium: 7.6 mg/dL — ABNORMAL LOW (ref 8.9–10.3)
Chloride: 98 mmol/L (ref 98–111)
Chloride: 99 mmol/L (ref 98–111)
Creatinine, Ser: 4.88 mg/dL — ABNORMAL HIGH (ref 0.61–1.24)
Creatinine, Ser: 4.37 mg/dL — ABNORMAL HIGH (ref 0.61–1.24)
GFR calc Af Amer: 14 mL/min — ABNORMAL LOW (ref >60)
GFR calc Af Amer: 15 mL/min — ABNORMAL LOW (ref >60)
GFR calc non Af Amer: 12 mL/min — ABNORMAL LOW (ref >60)
GFR calc non Af Amer: 13 mL/min — ABNORMAL LOW (ref >60)
Glucose, Bld: 163 mg/dL — ABNORMAL HIGH (ref 70–99)
Glucose, Bld: 259 mg/dL — ABNORMAL HIGH (ref 70–99)
Potassium: 3.8 mmol/L (ref 3.5–5.1)
Potassium: 4.5 mmol/L (ref 3.5–5.1)
Sodium: 136 mmol/L (ref 135–145)
Sodium: 135 mmol/L (ref 135–145)

## 2019-05-17 LAB — CBC
HCT: 30.9 % — ABNORMAL LOW (ref 39.0–52.0)
HCT: 30.7 % — ABNORMAL LOW (ref 39.0–52.0)
Hemoglobin: 9.6 g/dL — ABNORMAL LOW (ref 13.0–17.0)
Hemoglobin: 9.5 g/dL — ABNORMAL LOW (ref 13.0–17.0)
MCH: 28.9 pg (ref 26.0–34.0)
MCH: 28.8 pg (ref 26.0–34.0)
MCHC: 31.1 g/dL (ref 30.0–36.0)
MCHC: 30.9 g/dL (ref 30.0–36.0)
MCV: 93.1 fL (ref 80.0–100.0)
MCV: 93 fL (ref 80.0–100.0)
Platelets: 230 10*3/uL (ref 150–400)
Platelets: 260 10*3/uL (ref 150–400)
RBC: 3.32 MIL/uL — ABNORMAL LOW (ref 4.22–5.81)
RBC: 3.3 MIL/uL — ABNORMAL LOW (ref 4.22–5.81)
RDW: 17 % — ABNORMAL HIGH (ref 11.5–15.5)
RDW: 16.8 % — ABNORMAL HIGH (ref 11.5–15.5)
WBC: 7.5 10*3/uL (ref 4.0–10.5)
WBC: 8.6 10*3/uL (ref 4.0–10.5)
nRBC: 0.4 % — ABNORMAL HIGH (ref 0.0–0.2)
nRBC: 1 % — ABNORMAL HIGH (ref 0.0–0.2)

## 2019-05-17 LAB — PROTIME-INR
INR: 1.2 (ref 0.8–1.2)
Prothrombin Time: 15.3 seconds — ABNORMAL HIGH (ref 11.4–15.2)

## 2019-05-17 MED ORDER — APIXABAN 5 MG PO TABS: 5.0000 mg | ORAL_TABLET | Freq: Two times a day (BID) | ORAL | 0 refills | Status: DC

## 2019-05-17 MED ORDER — ACETAMINOPHEN 325 MG PO TABS
650.0000 mg | ORAL_TABLET | Freq: Four times a day (QID) | ORAL | Status: DC | PRN
  Filled 2019-05-17: qty 2

## 2019-05-17 MED ORDER — CALCIUM GLUCONATE-NACL 2-0.675 GM/100ML-% IV SOLN
2.0000 g | Freq: Once | INTRAVENOUS | Status: AC
  Administered 2019-05-17: 2000 mg via INTRAVENOUS
  Filled 2019-05-17: qty 100

## 2019-05-17 MED ORDER — CALCIUM CARBONATE ANTACID 500 MG PO CHEW: 2.0000 | CHEWABLE_TABLET | Freq: Three times a day (TID) | ORAL | 0 refills | Status: DC

## 2019-05-17 MED ORDER — SODIUM CHLORIDE 0.9 % IV SOLN
125.0000 mg | Freq: Once | INTRAVENOUS | Status: AC
  Administered 2019-05-17: 125 mg via INTRAVENOUS
  Filled 2019-05-17: qty 10

## 2019-05-17 MED ORDER — CALCIUM CARBONATE ANTACID 500 MG PO CHEW
2.0000 | CHEWABLE_TABLET | Freq: Three times a day (TID) | ORAL | Status: DC
  Administered 2019-05-17 – 2019-05-25 (×24): 400 mg via ORAL
  Filled 2019-05-17 (×24): qty 2

## 2019-05-17 MED ORDER — BICALUTAMIDE 50 MG PO TABS
50.0000 mg | ORAL_TABLET | Freq: Every day | ORAL | Status: DC
  Administered 2019-05-18 – 2019-05-25 (×6): 50 mg via ORAL
  Filled 2019-05-17 (×8): qty 1

## 2019-05-17 MED ORDER — HYDROMORPHONE HCL 1 MG/ML IJ SOLN
0.5000 mg | Freq: Once | INTRAMUSCULAR | Status: AC
  Administered 2019-05-17: 0.5 mg via INTRAVENOUS
  Filled 2019-05-17: qty 1

## 2019-05-17 MED ORDER — ONDANSETRON HCL 4 MG/2ML IJ SOLN
4.0000 mg | Freq: Once | INTRAMUSCULAR | Status: AC
  Administered 2019-05-17: 4 mg via INTRAVENOUS
  Filled 2019-05-17: qty 2

## 2019-05-17 MED ORDER — CALCITRIOL 0.5 MCG PO CAPS: 4.0000 ug | ORAL_CAPSULE | ORAL | 0 refills | Status: DC

## 2019-05-17 MED ORDER — SERTRALINE HCL 50 MG PO TABS: 50.0000 mg | ORAL_TABLET | Freq: Every day | ORAL | Status: DC

## 2019-05-17 MED ORDER — FERRIC CITRATE 1 GM 210 MG(FE) PO TABS
420.0000 mg | ORAL_TABLET | Freq: Three times a day (TID) | ORAL | Status: DC
  Administered 2019-05-18 – 2019-05-25 (×15): 420 mg via ORAL
  Filled 2019-05-17 (×24): qty 2

## 2019-05-17 MED ORDER — CALCITRIOL 0.5 MCG PO CAPS
ORAL_CAPSULE | ORAL | Status: AC
  Filled 2019-05-17: qty 8

## 2019-05-17 MED ORDER — ACETAMINOPHEN 650 MG RE SUPP: 650.0000 mg | Freq: Four times a day (QID) | RECTAL | Status: DC | PRN

## 2019-05-17 MED ORDER — SERTRALINE HCL 50 MG PO TABS: 50.0000 mg | ORAL_TABLET | Freq: Every day | ORAL | 0 refills | Status: DC

## 2019-05-17 MED ORDER — CALCITRIOL 0.5 MCG PO CAPS: 4.0000 ug | ORAL_CAPSULE | ORAL | Status: DC

## 2019-05-17 MED ORDER — OXYCODONE HCL 5 MG PO TABS
5.0000 mg | ORAL_TABLET | ORAL | Status: DC | PRN
  Administered 2019-05-18 – 2019-05-20 (×11): 5 mg via ORAL
  Filled 2019-05-17 (×11): qty 1

## 2019-05-17 MED ORDER — AMITRIPTYLINE HCL 50 MG PO TABS
100.0000 mg | ORAL_TABLET | Freq: Every day | ORAL | Status: DC
  Administered 2019-05-17 – 2019-05-24 (×8): 100 mg via ORAL
  Filled 2019-05-17 (×6): qty 2
  Filled 2019-05-17: qty 4
  Filled 2019-05-17: qty 2

## 2019-05-17 MED ORDER — CALCITRIOL 0.5 MCG PO CAPS
4.0000 ug | ORAL_CAPSULE | ORAL | Status: DC
  Administered 2019-05-24: 4 ug via ORAL

## 2019-05-17 MED ORDER — SODIUM CHLORIDE 0.9 % IV BOLUS
500.0000 mL | Freq: Once | INTRAVENOUS | Status: AC
  Administered 2019-05-17: 500 mL via INTRAVENOUS

## 2019-05-17 MED ORDER — SERTRALINE HCL 50 MG PO TABS
50.0000 mg | ORAL_TABLET | Freq: Every day | ORAL | Status: DC
  Administered 2019-05-18 – 2019-05-25 (×6): 50 mg via ORAL
  Filled 2019-05-17 (×6): qty 1

## 2019-05-17 NOTE — ED Provider Notes (Signed)
[facility] EMERGENCY DEPARTMENT Provider Note   CSN: [id number] Arrival date & time: 05/17/19  1916     History Chief Complaint  Patient presents with  . Fall    Richard Hall is a 63 y.o. male.  HPI Patient presents with a fall and left leg pain.  Discharged from the hospital earlier today went home and lost his footing and fell on his left leg.  Complaining of pain in his distal thigh.  There is swelling.  Still able to wiggle toes.  No other injury.  States did not hit his head.  He is on anticoagulation.  No chest pain.  No abdominal pain.  Recently admitted for hypocalcemia.    Past Medical History:  Diagnosis Date  . CVA (cerebral vascular accident) ([place]) 2010  . Diabetes mellitus with end-stage renal disease ([place])   . Dyslipidemia   . Essential hypertension   . Hypothyroidism (acquired)   . OSA on CPAP     Patient Active Problem List   Diagnosis Date Noted  . Fracture 05/17/2019  . Hypocalcemia 05/14/2019  . Bilateral leg edema 01/28/2019  . Symptomatic anemia 01/05/2019  . Gross hematuria 01/05/2019  . Gastrointestinal hemorrhage with melena 01/05/2019  . OSA on CPAP   . Hypothyroidism (acquired)   . Essential hypertension   . Dyslipidemia   . Diabetes mellitus with end-stage renal disease ([place])   . Acute blood loss anemia   . ESRD on hemodialysis ([place]) 08/13/2018  . ESRF (end stage renal failure) ([place]) 06/07/2018  . Chronic diastolic congestive heart failure ([place]) 02/09/2018  . Anemia, chronic renal failure, stage 4 (severe) ([place]) 11/09/2017  . Chronic respiratory failure with hypoxia and hypercapnia ([place]) 11/09/2017  . Iron deficiency anemia 10/28/2017  . Atrial fibrillation ([place]) 10/07/2017  . Morbid obesity with BMI of 40.0-44.9, adult ([place]) 07/14/2016  . Diabetic peripheral neuropathy associated with type 2 diabetes mellitus ([place]) 10/10/2015  . Allergy 04/30/2015  . Bronchitis 04/30/2015  . ED (erectile dysfunction) of organic  origin 04/30/2015  . Lipoma of back 04/30/2015  . Microalbuminuria 04/30/2015  . Osteoarthritis 04/30/2015  . Microalbuminuria due to type 2 diabetes mellitus ([place]) 01/09/2014  . Chronic kidney disease (CKD), stage III (moderate) 07/22/2013  . Acute ill-defined cerebrovascular disease 04/04/2013  . Dizziness 04/04/2013  . Hypercholesterolemia 04/04/2013  . Hypoxemia 04/04/2013  . Insomnia 04/04/2013  . Periodic limb movement disorder 04/04/2013  . Diplopia 06/07/2012  . Personal history of transient ischemic attack (TIA), and cerebral infarction without residual deficits 05/19/2011    Past Surgical History:  Procedure Laterality Date  . APPENDECTOMY    . TRANSURETHRAL RESECTION OF BLADDER TUMOR N/A 01/07/2019   Procedure: CYSTOSCOPY/ EVEACUATION CLOT;  Surgeon: Bjorn Loser, MD;  Location: [facility] ORS;  Service: Urology;  Laterality: N/A;       No family history on file.  Social History   Tobacco Use  . Smoking status: Former Smoker    Quit date: 1997    Years since quitting: 23.9  . Smokeless tobacco: Never Used  Substance Use Topics  . Alcohol use: Never  . Drug use: Never    Home Medications Prior to Admission medications   Medication Sig Start Date End Date Taking? Authorizing Provider  amitriptyline (ELAVIL) 100 MG tablet Take 100 mg by mouth at bedtime. 08/22/16   [provider]  apixaban (ELIQUIS) 5 MG TABS tablet Take 1 tablet (5 mg total) by mouth 2 (two) times daily. 05/17/19   Mitzi Hansen, MD  bicalutamide (CASODEX) 50 MG tablet Take 50 mg by mouth daily. 04/25/19   [provider]  calcitRIOL (ROCALTROL) 0.5 MCG capsule Take 8 capsules (4 mcg total) by mouth 3 (three) times a week. Take 3 times weekly, Tuesday, Thursday, and Saturday. 05/18/19   Mitzi Hansen, MD  calcium carbonate (TUMS - DOSED IN MG ELEMENTAL CALCIUM) 500 MG chewable tablet Chew 2 tablets (400 mg of elemental calcium total) by mouth 4 (four) times daily -  with meals  and at bedtime. 05/17/19   Mitzi Hansen, MD  EUTHYROX 75 MCG tablet Take 75 mcg by mouth every morning. 05/03/19   [provider]  ferric citrate (AURYXIA) 1 GM 210 MG(Fe) tablet Take 420 mg by mouth 3 (three) times daily with meals. 12/22/18   [provider]  fluticasone Asencion Islam ALLERGY RELIEF) 50 MCG/ACT nasal spray Place 1 spray into the nose as needed. 10/29/18   [provider]  furosemide (LASIX) 80 MG tablet Take 80 mg by mouth 4 (four) times a week. Mondays, wednesdays, fridays, and sundays 12/30/18   [provider]  glipiZIDE (GLUCOTROL) 10 MG tablet Take 10 mg by mouth daily. 11/30/18   [provider]  glipiZIDE (GLUCOTROL) 5 MG tablet Take 5 mg by mouth daily. 04/09/19   [provider]  hydrALAZINE (APRESOLINE) 100 MG tablet Take 50 mg by mouth 3 (three) times daily. 11/15/18   [provider]  isosorbide mononitrate (IMDUR) 60 MG 24 hr tablet Take 60 mg by mouth daily. 12/13/18   [provider]  LEUPROLIDE ACETATE, 6 MONTH, 45 MG injection Inject 45 mg into the skin every 6 (six) months. 04/25/19   [provider]  lidocaine-prilocaine (EMLA) cream Apply 1 application topically See admin instructions. Apply small amount to access site 1-2 hours before dialysis, cover with occlusive dressing (saran wrap) 12/15/18   [provider]  LINZESS 72 MCG capsule Take 72 mcg by mouth as needed.  12/30/18   [provider]  metoprolol tartrate (LOPRESSOR) 25 MG tablet Take 25 mg by mouth daily. 11/25/18   [provider]  NIFEdipine (ADALAT CC) 90 MG 24 hr tablet Take 90 mg by mouth daily before breakfast. 30 minutes before breakfast 10/08/18   [provider]  PERCOCET 5-325 MG tablet Take 1 tablet by mouth every 6 (six) hours as needed for pain. 04/25/19   [provider]  sertraline (ZOLOFT) 50 MG tablet Take 1 tablet (50 mg total) by mouth daily. 05/18/19   Mitzi Hansen,  MD    Allergies    Gabapentin  Review of Systems   Review of Systems  Constitutional: Negative for appetite change.  HENT: Negative for congestion.   Respiratory: Negative for shortness of breath.   Cardiovascular: Negative for chest pain.  Gastrointestinal: Negative for abdominal pain.  Genitourinary: Negative for flank pain.  Musculoskeletal: Negative for back pain.       Left leg pain and swelling.  Skin: Negative for wound.  Neurological: Positive for weakness.  Psychiatric/Behavioral: Negative for confusion.    Physical Exam Updated Vital Signs BP (!) 65/53   Pulse 86   Temp (!) 97.5 F (36.4 C) (Oral)   Resp 18   Ht 6' (1.829 m)   Wt 110.2 kg   SpO2 100%   BMI 32.96 kg/m   Physical Exam Vitals and nursing note reviewed.  Constitutional:      Appearance: Normal appearance.  HENT:     Head: Atraumatic.  Eyes:  Extraocular Movements: Extraocular movements intact.  Cardiovascular:     Rate and Rhythm: Regular rhythm.  Pulmonary:     Breath sounds: No wheezing or rhonchi.  Abdominal:     Tenderness: There is no abdominal tenderness.  Musculoskeletal:     Comments: No tenderness over hips.  Some pain and swelling over distal femur on left.  Knee appears nontender.  Sensation intact in foot.  No clear ecchymosis.  Skin:    Capillary Refill: Capillary refill takes less than 2 seconds.  Neurological:     Mental Status: He is oriented to person, place, and time.     ED Results / Procedures / Treatments   Labs (all labs ordered are listed, but only abnormal results are displayed) Labs Reviewed  CBC - Abnormal; Notable for the following components:      Result Value   RBC 3.30 (*)    Hemoglobin 9.5 (*)    HCT 30.7 (*)    RDW 16.8 (*)    nRBC 1.0 (*)    All other components within normal limits  BASIC METABOLIC PANEL  PROTIME-INR  BASIC METABOLIC PANEL  CBC  PROTIME-INR  APTT  SAMPLE TO BLOOD BANK    EKG None  Radiology DG FEMUR MIN 2  VIEWS LEFT  Result Date: 05/17/2019 CLINICAL DATA:  Left upper leg pain after a fall today. Initial encounter. EXAM: LEFT FEMUR 2 VIEWS COMPARISON:  None. FINDINGS: The patient has an acute, mildly comminuted fracture of the distal diaphysis and metaphysis of the femur. There is dorsal angulation of the distal fragment of approximately 30 degrees. 1/2 shaft width posterior displacement of the distal fragment is also seen and there is fragment override of approximately 3 cm. The fracture does not appear to extend into the articular surface of the femur at the knee. Marked degenerative change about the knee is noted. Associated soft tissue swelling noted. IMPRESSION: Acute fracture of the distal femur as described above. Left knee osteoarthritis. Electronically Signed   By: Inge Rise M.D.   On: 05/17/2019 20:10    Procedures Procedures (including critical care time)  Medications Ordered in ED Medications  amitriptyline (ELAVIL) tablet 100 mg (has no administration in time range)  sertraline (ZOLOFT) tablet 50 mg (has no administration in time range)  calcium carbonate (TUMS - dosed in mg elemental calcium) chewable tablet 400 mg of elemental calcium (has no administration in time range)  ferric citrate (AURYXIA) tablet 420 mg (has no administration in time range)  acetaminophen (TYLENOL) tablet 650 mg (has no administration in time range)    Or  acetaminophen (TYLENOL) suppository 650 mg (has no administration in time range)  sodium chloride 0.9 % bolus 500 mL (has no administration in time range)  HYDROmorphone (DILAUDID) injection 0.5 mg (0.5 mg Intravenous Given 05/17/19 2035)    ED Course  I have reviewed the triage vital signs and the nursing notes.  Pertinent labs & imaging results that were available during my care of the patient were reviewed by me and considered in my medical decision making (see chart for details).    MDM Rules/Calculators/A&P                       Patient with likely pathologic fracture of the left thigh.  Has had known metastatic disease there from prostate cancer.  However patient is on anticoagulation.  No other apparent injury.  Did have episode of hypotension right after getting pain medicine.  Will monitor and  give fluid boluses as needed.  Discussed with Dr. Stann Mainland from orthopedic surgery who will see patient.  Will admit to family practice.  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

## 2019-05-17 NOTE — Progress Notes (Signed)
NURSING PROGRESS NOTE  Richard Hall [id number] Discharge Data: 05/17/2019 3:00 PM Attending Provider: Axel Filler, * TOI:ZTIWP, Ria Bush, MD     Wilfrid Lund to be D/C'd Home per MD order.  Discussed with the patient the After Visit Summary and all questions fully answered. All IV's discontinued with no bleeding noted. All belongings returned to patient for patient to take home.   Last Vital Signs:  Blood pressure 113/75, pulse 63, temperature 97.8 F (36.6 C), temperature source Oral, resp. rate 18, height 6' (1.829 m), weight 109.1 kg, SpO2 96 %.  Discharge Medication List Allergies as of 05/17/2019      Reactions   Gabapentin Swelling      Medication List    STOP taking these medications   DULoxetine 30 MG capsule Commonly known as: CYMBALTA   gemfibrozil 600 MG tablet Commonly known as: LOPID     TAKE these medications   amitriptyline 100 MG tablet Commonly known as: ELAVIL Take 100 mg by mouth at bedtime.   apixaban 5 MG Tabs tablet Commonly known as: ELIQUIS Take 1 tablet (5 mg total) by mouth 2 (two) times daily.   Auryxia 1 GM 210 MG(Fe) tablet Generic drug: ferric citrate Take 420 mg by mouth 3 (three) times daily with meals.   bicalutamide 50 MG tablet Commonly known as: CASODEX Take 50 mg by mouth daily.   calcitRIOL 0.5 MCG capsule Commonly known as: ROCALTROL Take 8 capsules (4 mcg total) by mouth 3 (three) times a week. Take 3 times weekly, Tuesday, Thursday, and Saturday. Start taking on: May 18, 2019   calcium carbonate 500 MG chewable tablet Commonly known as: TUMS - dosed in mg elemental calcium Chew 2 tablets (400 mg of elemental calcium total) by mouth 4 (four) times daily -  with meals and at bedtime. What changed:   medication strength  how much to take  when to take this   Euthyrox 75 MCG tablet Generic drug: levothyroxine Take 75 mcg by mouth every morning.   Flonase Allergy Relief 50 MCG/ACT nasal  spray Generic drug: fluticasone Place 1 spray into the nose as needed.   furosemide 80 MG tablet Commonly known as: LASIX Take 80 mg by mouth 4 (four) times a week. Mondays, wednesdays, fridays, and sundays   glipiZIDE 10 MG tablet Commonly known as: GLUCOTROL Take 10 mg by mouth daily.   glipiZIDE 5 MG tablet Commonly known as: GLUCOTROL Take 5 mg by mouth daily.   hydrALAZINE 100 MG tablet Commonly known as: APRESOLINE Take 50 mg by mouth 3 (three) times daily.   isosorbide mononitrate 60 MG 24 hr tablet Commonly known as: IMDUR Take 60 mg by mouth daily.   leuprolide acetate (6 Month) 45 MG injection Generic drug: leuprolide (6 Month) Inject 45 mg into the skin every 6 (six) months.   lidocaine-prilocaine cream Commonly known as: EMLA Apply 1 application topically See admin instructions. Apply small amount to access site 1-2 hours before dialysis, cover with occlusive dressing (saran wrap)   Linzess 72 MCG capsule Generic drug: linaclotide Take 72 mcg by mouth as needed.   metoprolol tartrate 25 MG tablet Commonly known as: LOPRESSOR Take 25 mg by mouth daily.   NIFEdipine 90 MG 24 hr tablet Commonly known as: ADALAT CC Take 90 mg by mouth daily before breakfast. 30 minutes before breakfast   Percocet 5-325 MG tablet Generic drug: oxyCODONE-acetaminophen Take 1 tablet by mouth every 6 (six) hours as needed for pain.   sertraline 50  MG tablet Commonly known as: ZOLOFT Take 1 tablet (50 mg total) by mouth daily. Start taking on: May 18, 2019

## 2019-05-17 NOTE — ED Notes (Signed)
Notified Dr. Alvino Chapel of pt's bp

## 2019-05-17 NOTE — H&P (Signed)
Date: 05/17/2019               Patient Name:  Richard Hall MRN: [id number]  DOB: 03-Sep-1955 Age / Sex: 63 y.o., male   PCP: Richard Mc, MD         Medical Service: Internal Medicine Teaching Service         Attending Physician: Dr. Rebeca Alert Richard Opitz, MD    First Contact: Dr. Darrick Hall Pager: [phone]  Second Contact: Dr. Myrtie Hall Pager: [phone]       After Hours (After 5p/  First Contact Pager: [phone]  weekends / holidays): Second Contact Pager: [phone]   Chief Complaint: left leg pain   History of Present Illness: Richard Hall is a 63 male with history of ESRD on HD, afib, HTN, CVA, hypothyroidism prostate cancer with chronic foley and extensive bony metastasis who presented to the ED after a ground level fall. Patient was discharged from IMTS earlier today. Since arriving home he was feeling well. He hadn't had much to drink all day and went to get himself a glass of water when he hit a wet spot on his floor, slipped and fell on his left leg. He denies head trauma, LOC, or other injuries. Prior to falling he did not have any dizziness, chest pain or shortness of breath.  His pain manageable when his leg is still and resting in externally rotated position. No numbness or tingling in injured leg. He is on Eliquis for afib and had a dose this morning.  He denies fevers, chills, n/v, abdominal pain, issues with foley catheter.   Meds:    No current facility-administered medications on file prior to encounter.   Current Outpatient Medications on File Prior to Encounter  Medication Sig  . amitriptyline (ELAVIL) 100 MG tablet Take 100 mg by mouth at bedtime.  Marland Kitchen apixaban (ELIQUIS) 5 MG TABS tablet Take 1 tablet (5 mg total) by mouth 2 (two) times daily.  . bicalutamide (CASODEX) 50 MG tablet Take 50 mg by mouth daily.  Derrill Memo ON 05/18/2019] calcitRIOL (ROCALTROL) 0.5 MCG capsule Take 8 capsules (4 mcg total) by mouth 3 (three) times a week. Take 3 times weekly, Tuesday,  Thursday, and Saturday.  . calcium carbonate (TUMS - DOSED IN MG ELEMENTAL CALCIUM) 500 MG chewable tablet Chew 2 tablets (400 mg of elemental calcium total) by mouth 4 (four) times daily -  with meals and at bedtime.  . EUTHYROX 75 MCG tablet Take 75 mcg by mouth every morning.  . ferric citrate (AURYXIA) 1 GM 210 MG(Fe) tablet Take 420 mg by mouth 3 (three) times daily with meals.  . fluticasone (FLONASE ALLERGY RELIEF) 50 MCG/ACT nasal spray Place 1 spray into the nose as needed.  . furosemide (LASIX) 80 MG tablet Take 80 mg by mouth 4 (four) times a week. Mondays, wednesdays, fridays, and sundays  . glipiZIDE (GLUCOTROL) 5 MG tablet Take 5 mg by mouth daily.  . hydrALAZINE (APRESOLINE) 100 MG tablet Take 50 mg by mouth 3 (three) times daily.  . isosorbide mononitrate (IMDUR) 60 MG 24 hr tablet Take 60 mg by mouth daily.  Marland Kitchen LEUPROLIDE ACETATE, 6 MONTH, 45 MG injection Inject 45 mg into the skin every 6 (six) months.  . lidocaine-prilocaine (EMLA) cream Apply 1 application topically See admin instructions. Apply small amount to access site 1-2 hours before dialysis, cover with occlusive dressing (saran wrap)  . LINZESS 72 MCG capsule Take 72 mcg by mouth as needed.   Marland Kitchen  metoprolol tartrate (LOPRESSOR) 25 MG tablet Take 25 mg by mouth daily.  Marland Kitchen NIFEdipine (ADALAT CC) 90 MG 24 hr tablet Take 90 mg by mouth daily before breakfast. 30 minutes before breakfast  . PERCOCET 5-325 MG tablet Take 1 tablet by mouth every 6 (six) hours as needed for pain.  Derrill Memo ON 05/18/2019] sertraline (ZOLOFT) 50 MG tablet Take 1 tablet (50 mg total) by mouth daily.    Allergies: Allergies as of 05/17/2019 - Review Complete 05/17/2019  Allergen Reaction Noted  . Gabapentin Swelling 02/21/2011   Past Medical History:  Diagnosis Date  . CVA (cerebral vascular accident) ([place]) 2010  . Diabetes mellitus with end-stage renal disease ([place])   . Dyslipidemia   . Essential hypertension   . Hypothyroidism (acquired)     . OSA on CPAP     Family History:   Social History: Lives alone in an apartment, able to take care of himself independently. Son nearby in [place] that checks in on him. Remote history of tobacco use, quit 23 years ago. No EtOH or illicit substance use.   Review of Systems: A complete ROS was negative except as per HPI.   Physical Exam: Blood pressure (!) 82/60, pulse 82, temperature (!) 97.5 F (36.4 C), temperature source Oral, resp. rate 15, height 6' (1.829 m), weight 110.2 kg, SpO2 96 %. Physical Exam Constitutional:      General: He is not in acute distress.    Comments: Chronically ill appearing   HENT:     Head: Normocephalic and atraumatic.  Eyes:     Extraocular Movements: Extraocular movements intact.     Conjunctiva/sclera: Conjunctivae normal.  Cardiovascular:     Rate and Rhythm: Normal rate and regular rhythm.  Pulmonary:     Effort: Pulmonary effort is normal.     Breath sounds: Normal breath sounds.  Abdominal:     General: Bowel sounds are normal.     Palpations: Abdomen is soft.     Tenderness: There is no abdominal tenderness.  Musculoskeletal:     Cervical back: Neck supple.     Right lower leg: No edema.     Left lower leg: No edema.     Comments: Pain and swelling over medial aspect of distal femur. No obvious hematoma. Neurovascularly intact.   Skin:    General: Skin is warm and dry.  Neurological:     General: No focal deficit present.     Mental Status: He is alert and oriented to person, place, and time.  Psychiatric:        Mood and Affect: Mood normal.        Behavior: Behavior normal.     Assessment & Plan by Problem: Active Problems:   Fracture  Richard Hall is a 63 male with history of ESRD on HD, afib, HTN, CVA, hypothyroidism prostate cancer with chronic foley and extensive bony metastasis who was discharged from the hospital earlier today presenting back to the ED after slipping and falling at home and injuring his left leg,  found to have distal comminuted fracture.   Closed distal femur fracture: pathologic in the setting of known metastasis. He is neurovascularly intact and pain currently well controlled.  - appreciate ortho consult; will follow-up recs - holding Eliquis  - Oxy IR prn for pain control; blood pressure sensitive to pain medications   Metastatic Prostate cancer Chronic Hypocalcemia - continued on casodex, calcium carbonate, and calcitriol   ESRD - patient dialyzes T/TH/S - had HD  session prior to discharge earlier today - will consult nephro in the morning for inpatient HD management   Afib - currently rate controlled; holding metoprolol due to soft blood pressure - holding Eliquis in anticipation for surgery   HTN - blood pressures currently soft likely due to recent pain medications given - holding hydral, imdur, metop and nifedipine; can resume as indicated   Depression: continue home Zoloft 50 mg   Diet: NPO at midnight DVT ppx: SCDs CODE: FULL    Dispo: Admit patient to Inpatient with expected length of stay greater than 2 midnights.  SignedDelice Bison, DO 05/17/2019, 9:26 PM  Pager: [phone]

## 2019-05-17 NOTE — ED Triage Notes (Signed)
Pt arrived via [facility] from home for a fall. Pt states he "lost his footing and fell on his left leg." Pt denies hitting head, denies LOC. Pt states 3/10 left leg pain. Pt has 3+ non-pitting edema of left upper leg. Pt is A&Ox4. Pt has 2+ left pedal pulse, sensation intact, pt able to wiggle toes.

## 2019-05-17 NOTE — Consult Note (Signed)
ORTHOPAEDIC CONSULTATION  REQUESTING PHYSICIAN: Oda Kilts, MD  PCP:  [name], MD  Chief Complaint: left left pain  HPI: Richard Hall is a 63 y.o. male who complains of left thigh pain and deformity.  He has been seen by the emergency department providers and noted to have a distal femur fracture on the left leg is closed.  He does have a history of prostate cancer and there is some concern for pathologic nature of this fracture.  He actually, was recently discharged from the hospital just earlier today following an admission to the medicine service for some episodes of dizziness as well as hypocalcemia.  These 2 were felt to be related.  He did have a fall, that was ground-level on presentation to his house.  He does state he typically walks with a cane.  He does live alone.  He has multiple medical comorbidities including end-stage renal disease on hemodialysis, diabetes, hypertension, and depression.  He also has the aforementioned prostate cancer.  Currently he denies any numbness or tingling in the left lower extremity.  Past Medical History:  Diagnosis Date  . CVA (cerebral vascular accident) ([place]) 2010  . Diabetes mellitus with end-stage renal disease ([place])   . Dyslipidemia   . Essential hypertension   . Hypothyroidism (acquired)   . OSA on CPAP    Past Surgical History:  Procedure Laterality Date  . APPENDECTOMY    . TRANSURETHRAL RESECTION OF BLADDER TUMOR N/A 01/07/2019   Procedure: CYSTOSCOPY/ EVEACUATION CLOT;  Surgeon: Bjorn Loser, MD;  Location: [facility];  Service: Urology;  Laterality: N/A;   Social History   Socioeconomic History  . Marital status: Single    Spouse name: Not on file  . Number of children: Not on file  . Years of education: Not on file  . Highest education level: Not on file  Occupational History  . Occupation: retired  Tobacco Use  . Smoking status: Former Smoker    Quit date: 1997    Years since quitting: 23.9  .  Smokeless tobacco: Never Used  Substance and Sexual Activity  . Alcohol use: Never  . Drug use: Never  . Sexual activity: Not on file  Other Topics Concern  . Not on file  Social History Narrative  . Not on file   Social Determinants of Health   Financial Resource Strain:   . Difficulty of Paying Living Expenses: Not on file  Food Insecurity:   . Worried About Charity fundraiser in the Last Year: Not on file  . Ran Out of Food in the Last Year: Not on file  Transportation Needs:   . Lack of Transportation (Medical): Not on file  . Lack of Transportation (Non-Medical): Not on file  Physical Activity:   . Days of Exercise per Week: Not on file  . Minutes of Exercise per Session: Not on file  Stress:   . Feeling of Stress : Not on file  Social Connections:   . Frequency of Communication with Friends and Family: Not on file  . Frequency of Social Gatherings with Friends and Family: Not on file  . Attends Religious Services: Not on file  . Active Member of Clubs or Organizations: Not on file  . Attends Archivist Meetings: Not on file  . Marital Status: Not on file   No family history on file. Allergies  Allergen Reactions  . Gabapentin Swelling   Prior to Admission medications   Medication Sig Start Date  End Date Taking? Authorizing Provider  amitriptyline (ELAVIL) 100 MG tablet Take 100 mg by mouth at bedtime. 08/22/16  Yes [provider]  apixaban (ELIQUIS) 5 MG TABS tablet Take 1 tablet (5 mg total) by mouth 2 (two) times daily. 05/17/19  Yes Christian, Rylee, MD  bicalutamide (CASODEX) 50 MG tablet Take 50 mg by mouth daily. 04/25/19  Yes [provider]  calcitRIOL (ROCALTROL) 0.5 MCG capsule Take 8 capsules (4 mcg total) by mouth 3 (three) times a week. Take 3 times weekly, Tuesday, Thursday, and Saturday. 05/18/19  Yes Christian, Rylee, MD  calcium carbonate (TUMS - DOSED IN MG ELEMENTAL CALCIUM) 500 MG chewable tablet Chew 2 tablets (400 mg  of elemental calcium total) by mouth 4 (four) times daily -  with meals and at bedtime. 05/17/19  Yes Christian, Rylee, MD  EUTHYROX 75 MCG tablet Take 75 mcg by mouth every morning. 05/03/19  Yes [provider]  ferric citrate (AURYXIA) 1 GM 210 MG(Fe) tablet Take 420 mg by mouth 3 (three) times daily with meals. 12/22/18  Yes [provider]  fluticasone (FLONASE ALLERGY RELIEF) 50 MCG/ACT nasal spray Place 1 spray into the nose as needed. 10/29/18  Yes [provider]  furosemide (LASIX) 80 MG tablet Take 80 mg by mouth 4 (four) times a week. Mondays, wednesdays, fridays, and sundays 12/30/18  Yes [provider]  glipiZIDE (GLUCOTROL) 5 MG tablet Take 5 mg by mouth daily. 04/09/19  Yes [provider]  hydrALAZINE (APRESOLINE) 100 MG tablet Take 50 mg by mouth 3 (three) times daily. 11/15/18  Yes [provider]  isosorbide mononitrate (IMDUR) 60 MG 24 hr tablet Take 60 mg by mouth daily. 12/13/18  Yes [provider]  LEUPROLIDE ACETATE, 6 MONTH, 45 MG injection Inject 45 mg into the skin every 6 (six) months. 04/25/19  Yes [provider]  lidocaine-prilocaine (EMLA) cream Apply 1 application topically See admin instructions. Apply small amount to access site 1-2 hours before dialysis, cover with occlusive dressing (saran wrap) 12/15/18  Yes [provider]  LINZESS 72 MCG capsule Take 72 mcg by mouth as needed.  12/30/18  Yes [provider]  metoprolol tartrate (LOPRESSOR) 25 MG tablet Take 25 mg by mouth daily. 11/25/18  Yes [provider]  NIFEdipine (ADALAT CC) 90 MG 24 hr tablet Take 90 mg by mouth daily before breakfast. 30 minutes before breakfast 10/08/18  Yes [provider]  PERCOCET 5-325 MG tablet Take 1 tablet by mouth every 6 (six) hours as needed for pain. 04/25/19  Yes [provider]  sertraline (ZOLOFT) 50 MG tablet Take 1 tablet (50 mg total) by mouth daily. 05/18/19  Yes  Mitzi Hansen, MD   CT KNEE LEFT WO CONTRAST  Result Date: 05/17/2019 CLINICAL DATA:  Distal femoral fracture, history of metastatic prostate cancer EXAM: CT OF THE LEFT KNEE WITHOUT CONTRAST TECHNIQUE: Multidetector CT imaging of the LEFT knee was performed according to the standard protocol. Multiplanar CT image reconstructions were also generated. COMPARISON:  Same day radiographs FINDINGS: Bones/Joint/Cartilage There is diffuse bony demineralization. A cortically based sclerotic focus in the anterior distal femoral diaphysis measuring approximately 1.7 by 0.8 by 2.3 cm in size is likely a sclerotic focus of prostate metastasis corresponding well to the location of increased uptake on recent bone scan. Additional sclerotic focus is involved within an anteromedial fracture fragment. Redemonstration of the comminuted, posteriorly displaced and angulated fracture of the distal femoral metadiaphysis with extension into the posterior intercondylar  fossa. Some heterogeneous attenuation within the trabecular likely reflecting some associated intra osseous hemorrhage. There is a large effusion with lipohemarthrosis. Extensive surrounding soft tissue hemorrhage is present as well. There are advanced moderate to severe tricompartmental degenerative changes with medial compartmental narrowing and near bone-on-bone contact with subchondral sclerosis and cystic change. Similar periarticular hypertrophic spurring is noted along the patellofemoral articulation as well. Ligaments Suboptimally assessed by CT. Muscles and Tendons Intramuscular hemorrhage of the distal thigh musculature medially adjacent the comminuted fracture fragments. No tendon subluxation or discontinuity is seen. Soft tissues Extensive vascular calcium noted in the distal SFA, popliteal artery, TP trunk and anterior tibial artery. Circumferential soft tissue swelling adjacent the distal femoral fracture. IMPRESSION: 1. Comminuted, posteriorly  displaced and angulated fracture of the distal femoral metadiaphysis with extension into the posterior intercondylar fossa. 2. A cortically based sclerotic focus in the anterior distal femoral diaphysis measuring 1.7 x 0.8 x 2.3 cm in size is likely a sclerotic focus of prostate metastasis corresponding well to the location of increased uptake on recent bone scan. Additional sclerotic focus is involved within an anteromedial fracture fragment. Suggesting at least some underlying pathologic component. 3. Diffuse bony demineralization with intra trabecular hemorrhage. 4. Large lipohemarthrosis. 5. Intramuscular hemorrhage of the distal thigh musculature medially adjacent the comminuted fracture fragments. 6. Moderate to severe tricompartmental degenerative changes. 7. Extensive vascular calcium noted in the distal SFA, popliteal artery, TP trunk and anterior tibial artery. 8. Circumferential soft tissue swelling adjacent to the distal femoral fracture. Electronically Signed   By: Lovena Le M.D.   On: 05/17/2019 22:35   DG FEMUR MIN 2 VIEWS LEFT  Result Date: 05/17/2019 CLINICAL DATA:  Left upper leg pain after a fall today. Initial encounter. EXAM: LEFT FEMUR 2 VIEWS COMPARISON:  None. FINDINGS: The patient has an acute, mildly comminuted fracture of the distal diaphysis and metaphysis of the femur. There is dorsal angulation of the distal fragment of approximately 30 degrees. 1/2 shaft width posterior displacement of the distal fragment is also seen and there is fragment override of approximately 3 cm. The fracture does not appear to extend into the articular surface of the femur at the knee. Marked degenerative change about the knee is noted. Associated soft tissue swelling noted. IMPRESSION: Acute fracture of the distal femur as described above. Left knee osteoarthritis. Electronically Signed   By: Inge Rise M.D.   On: 05/17/2019 20:10    Positive ROS: All other systems have been reviewed and were  otherwise negative with the exception of those mentioned in the HPI and as above.  Physical Exam: General: Alert, no acute distress Cardiovascular: No pedal edema Respiratory: No cyanosis, no use of accessory musculature GI: No organomegaly, abdomen is soft and non-tender Skin: No lesions in the area of chief complaint Neurologic: Sensation intact distally Psychiatric: Patient is competent for consent with normal mood and affect Lymphatic: No axillary or cervical lymphadenopathy  MUSCULOSKELETAL:  Left leg is held in slight flexion at the knee.  He has moderate knee effusion.  He has tenderness along the distal thigh.  No open wounds.  He has intact distal pulses that are 2+.  Motor is intact as well.  Assessment: 1.  Left distal femur fracture, closed.  Plan: -At this juncture he is going to be admitted to the medicine service for management of his multiple medical comorbidities.  We appreciate their astute care.  -From an orthopedic standpoint he will need internal fixation of this fracture.  I am going  to defer to our orthopedic trauma colleagues for definitive treatment and will defer to their judgment on intramedullary nail versus plating.  -Due to his current use of Eliquis we would recommend holding that beginning immediately and plan for slight delay in surgical treatment until this Thursday.  He may have a usual diet today and tomorrow.  He will need to be n.p.o. Wednesday night at midnight.  -We will follow along.  He should be nonweightbearing to the left lower extremity.    Nicholes Stairs, MD Cell [phone]    05/17/2019 10:40 PM

## 2019-05-17 NOTE — ED Notes (Signed)
Patient transported to CT 

## 2019-05-17 NOTE — Progress Notes (Addendum)
[facility] Progress Note  Subjective Seen before HD today.  Feels well.  He states follows with Dr. Tresa Moore for prostate cancer - states does not go to [facility] oncology though prev reported as seeing them.  We discussed blood transfusion and he is willing to accept blood products if needed.   Review of systems:  Denies chest pain or shortness of breath  Denies nausea or vomiting    Vitals:   05/16/19 1300 05/16/19 1924 05/17/19 0019 05/17/19 0435  BP: (!) 153/79 (!) 142/99  140/90  Pulse: 69 72  (!) 46  Resp: 18 18  18   Temp: 97.9 F (36.6 C) 97.9 F (36.6 C)  97.9 F (36.6 C)  TempSrc: Oral Oral  Oral  SpO2: 98% 97%  95%  Weight:   112.1 kg   Height:        Exam: Gen pleasant obese WM not in distress, Chest clear to auscultation and unlabored  RRR no MRG Abd soft nt/nd obese habitus  MS no joint effusions or deformity Ext  no pitting LE edema Neuro is alert, Ox 3 , nf, no cramping/ twitching or fasciculations appreciated  psych norma mood and affect  Access - LUE AVF in place    Home meds: reported as   - furosemide 80 4d per wk/ metoprolol 25 / nifedipine 90 / hydralazine 50 tid  - amitriptyline 100/ duloxetine 30/ percocet prn qid  - insulin glargine 40/ glipizide 5- 10 qd  - ferric citrate ac/ euthyrox 75 qd  - atorvastatin 40 / gemfibrozil 600 bid/ isosorbide dinitrate 60  - leuprolice 45mg  q 102mo/ bicalutamide 50 qd    Outpt HD: TTS [facility]  4h 34min  400/700  115kg  2K/3.5Ca bath  Heparin none   LFA AVF  - vit D 4ug tiw  - venofer 100 / hd     Assessment/ Plan: 1. Hypocalcemia - recent onset 1-2 mos ago per the patient. Was admitted on high doses of po vit D at HD, high ca bath and tums 1gm qid at home. Acute hypocalcemia in ESRD pt is unusual w/o recent PTX.  He does have metastatic prostate Ca; given hypocalcemia is possible that he could have been given denosumab (prolia) at [facility] oncology as can cause severe hypocalcemia in ESRD  patients.  Would not redose if this was given.  Continue PO calcium, high Ca bath and high dose vit D tiw. Corrected calcium 7.6  Repeat calcium IV   - HD today with high calcium bath  - await BMP from today  - Would transition off of gemfibrozil and cymbalta given ESRD - Would not provide any additional prolia if this was given - will ask that Dr. Zettie Pho office be notified in the event it was given   2. ESRD - on HD TTS schedule.  (Was previously off schedule with inpt volumes) 3. HTN - optimize volume with HD 4. DM2  Per primary team  5. Prostate cancer - f/b Dr Tresa Moore in [place] 6. HL - per primary team. Would transition off of gemfibrozil given his ESRD 7. Depression - per primary team  8. Anemia - ESRD.  Hb today.  Spoke with HD RN - transfuse if hemoglobin under 7.5.  No ESA in setting of malignancy for now.  On venofer with HD (nulceit ordered)  Hopeful for discharge soon when improvement in his calcium from a renal standpoint.  Inpatient medications: . amitriptyline  100 mg Oral QHS  . apixaban  5 mg  Oral BID  . bicalutamide  50 mg Oral Daily  . calcitRIOL  4 mcg Oral Once per day on Tue Thu Sat  . calcium carbonate  2 tablet Oral TID WC & HS  . Chlorhexidine Gluconate Cloth  6 each Topical Q0600  . DULoxetine  30 mg Oral Daily  . ferric citrate  420 mg Oral TID WC  . furosemide  80 mg Oral Once per day on Sun Tue Thu Sat  . hydrALAZINE  50 mg Oral TID  . insulin aspart  0-15 Units Subcutaneous TID WC  . isosorbide mononitrate  60 mg Oral Daily  . levothyroxine  75 mcg Oral q morning - 10a  . metoprolol tartrate  25 mg Oral Daily  . sodium chloride flush  3 mL Intravenous Q12H    acetaminophen **OR** acetaminophen     Claudia Desanctis 05/17/2019 6:42 AM   Called [facility] and requested a call back re: whether pt had received prolia through them.  He was previously seen by [facility] with provider at [phone] per charting.  He is not seen by [facility]     Claudia Desanctis

## 2019-05-17 NOTE — Procedures (Signed)
Seen and examined on dialysis; procedure supervised.  Blood pressure 92/53 and HR 53.  BF 350.  UF goal reduced for hypotension.   LUE AVF in use.   Claudia Desanctis, MD 05/17/2019  8:28 AM

## 2019-05-17 NOTE — ED Notes (Signed)
Patient transported to X-ray 

## 2019-05-17 NOTE — Progress Notes (Signed)
   05/17/19 0800  OT Visit Information  Last OT Received On 05/17/19  Assistance Needed +1  Reason Eval/Treat Not Completed Patient at procedure or test/ unavailable. Pt at HD.  History of Present Illness Patient is a 63 year old male admitted with general weakness, dizziness. PMH to include: Metastatic prostate Ca, OSA, DM, ESRD on HD, hypocalcemia, CVA, HTN   Plan to reattempt at a later time.  Tyrone Schimke, OT Acute Rehabilitation Services Pager: [phone] Office: [phone]

## 2019-05-17 NOTE — Progress Notes (Signed)
   Subjective:  No acute events overnight. Pt is receiving a hemodialysis during this encounter. Pt reports he is feeling better and just like he did prior to his hospitalization. Pt has questions about if he will continue to receive iron. It was explained to pt that he would most likely need it since he can no longer receive EPO. PT acknowledged understanding.  Objective:  Vital signs in last 24 hours: Vitals:   05/17/19 0800 05/17/19 0830 05/17/19 0900 05/17/19 0930  BP: 116/66 116/60 (!) 143/53 126/74  Pulse: (!) 105 89 69 (!) 57  Resp: 13 17 17 18   Temp:      TempSrc:      SpO2:      Weight:      Height:       Weight change: -1.516 kg  Intake/Output Summary (Last 24 hours) at 05/17/2019 1040 Last data filed at 05/17/2019 0600 Gross per 24 hour  Intake 800 ml  Output 2050 ml  Net -1250 ml   Physical Exam Constitutional:      General: He is not in acute distress.    Appearance: Normal appearance.  Neurological:     General: No focal deficit present.     Mental Status: He is alert. Mental status is at baseline.  Psychiatric:        Mood and Affect: Mood normal.    Labs:  Ca: 6.7  Assessment/Plan:  Principal Problem:   Atrial fibrillation (HCC) Active Problems:   OSA on CPAP   Diabetes mellitus with end-stage renal disease ([place])   Dizziness   ESRD on hemodialysis ([place])   Hypocalcemia  Richard Hall is a 63 year-old male with a PMHx of ESRD on HD, atrial fibrillation, hypertension, CVA, and metastatic prostate cancer who presented with an episode of dizziness. Pt remains hemodynamically stable. Hypocalcemia is improving.  ESRD on HD:  Currently receiving HD Anion gap metbaolic acidosis: Anion gap and low bicarb have resolved Vitamin D deficiency Hypocalcemia: Corrected Ca this morning: 7.7. Hypocalcemia continue to improve, pt showing no symptoms. Pt is clinically improved and could be ready for discharge with outpatient treatment and follow-up. Unable to contact  oncologist yesterday. Per nephrology note Dr. Tresa Moore with urology is following this pt.   - Nephrology on board, greatly appreciate recs. - Continue calcitriol and calcium carbonate. - Give one more dose calcium gluconate 2g IV - Will call Dr. Zettie Pho office to determine if pt received denosumab treatment - Pt could benefit from outpatient f/u for hypocalcemia  Atrial fibrillation: Rates are well controlled. No dizziness, receiving anticoagulation. - Continue metoprolol 25 mg QD - Continue eliquis 5 mg BID - d/c cardiac telemetry for discharge  Prostate cancer: Pt being followed by urology. - Continue bicalutamide  T2DM: - Continue SSI  Hypertension: Blood pressure mildly elevated but improving. - Continue hydralazine 50 mg TID, Imdur 60 mg QD, and metoprolol 25 mg QD    LOS: 2 days   Carin Primrose, Medical Student 05/17/2019, 10:40 AM

## 2019-05-17 NOTE — Discharge Summary (Addendum)
Name: Richard Hall MRN: [id number] DOB: [birth year] 63 y.o. PCP: [name], MD  Date of Admission: 05/14/2019 12:09 PM Date of Discharge: 05/17/2019 Attending Physician: Lalla Brothers, MD  Discharge Diagnosis: 1. Dizziness due to hypocalcemia 2. Hypocalcemia 3. Prostate cancer 4. Depression  Discharge Medications: Allergies as of 05/17/2019       Reactions   Gabapentin Swelling        Medication List     STOP taking these medications    DULoxetine 30 MG capsule Commonly known as: CYMBALTA   gemfibrozil 600 MG tablet Commonly known as: LOPID       TAKE these medications    amitriptyline 100 MG tablet Commonly known as: ELAVIL Take 100 mg by mouth at bedtime.   apixaban 5 MG Tabs tablet Commonly known as: ELIQUIS Take 1 tablet (5 mg total) by mouth 2 (two) times daily.   Auryxia 1 GM 210 MG(Fe) tablet Generic drug: ferric citrate Take 420 mg by mouth 3 (three) times daily with meals.   bicalutamide 50 MG tablet Commonly known as: CASODEX Take 50 mg by mouth daily.   calcitRIOL 0.5 MCG capsule Commonly known as: ROCALTROL Take 8 capsules (4 mcg total) by mouth 3 (three) times a week. Take 3 times weekly, Tuesday, Thursday, and Saturday. Start taking on: May 18, 2019   calcium carbonate 500 MG chewable tablet Commonly known as: TUMS - dosed in mg elemental calcium Chew 2 tablets (400 mg of elemental calcium total) by mouth 4 (four) times daily -  with meals and at bedtime. What changed:  medication strength how much to take when to take this   Euthyrox 75 MCG tablet Generic drug: levothyroxine Take 75 mcg by mouth every morning.   Flonase Allergy Relief 50 MCG/ACT nasal spray Generic drug: fluticasone Place 1 spray into the nose as needed.   furosemide 80 MG tablet Commonly known as: LASIX Take 80 mg by mouth 4 (four) times a week. Mondays, wednesdays, fridays, and sundays   glipiZIDE 10 MG tablet Commonly known as: GLUCOTROL  Take 10 mg by mouth daily.   glipiZIDE 5 MG tablet Commonly known as: GLUCOTROL Take 5 mg by mouth daily.   hydrALAZINE 100 MG tablet Commonly known as: APRESOLINE Take 50 mg by mouth 3 (three) times daily.   isosorbide mononitrate 60 MG 24 hr tablet Commonly known as: IMDUR Take 60 mg by mouth daily.   leuprolide acetate (6 Month) 45 MG injection Generic drug: leuprolide (6 Month) Inject 45 mg into the skin every 6 (six) months.   lidocaine-prilocaine cream Commonly known as: EMLA Apply 1 application topically See admin instructions. Apply small amount to access site 1-2 hours before dialysis, cover with occlusive dressing (saran wrap)   Linzess 72 MCG capsule Generic drug: linaclotide Take 72 mcg by mouth as needed.   metoprolol tartrate 25 MG tablet Commonly known as: LOPRESSOR Take 25 mg by mouth daily.   NIFEdipine 90 MG 24 hr tablet Commonly known as: ADALAT CC Take 90 mg by mouth daily before breakfast. 30 minutes before breakfast   Percocet 5-325 MG tablet Generic drug: oxyCODONE-acetaminophen Take 1 tablet by mouth every 6 (six) hours as needed for pain.   sertraline 50 MG tablet Commonly known as: ZOLOFT Take 1 tablet (50 mg total) by mouth daily. Start taking on: May 18, 2019        Disposition and follow-up:   [name] was discharged from [facility] in Stable condition.  At the  hospital follow up visit please address:  Hypocalcemia.  Even after calcium replacement, patient continued to be hypocalcemic. Patient started on calitriol as Vitamin D levels were very low. The cause for hypocalcemia remains unclear however slowly stabilized but did not entirely resolve. He will need to have his labs checked at his next dialysis appointment and have it replaced, if needed, at that time.  Prostate cancer with osseous metastasis. Reported recent diagnosis in Aug 2020. Bone scan in Nov revealed numerous osseous mets. Unclear who he is  seeing for this. Made several calls to attempt to track down who is providing him treatment to no avail.  -please continue to try to discover who is providing him treatment and inquire about prior Prolia (denosumab) injections.   Chronic depression. Previously on 30mg  duloxetine however it was pointed out, by nephrology, that this should be discontinued given his ESRD. Duloxetine replaced with sertraline 50mg  upon discharge. Please re-evaluate at time of follow up.   2.  Labs / imaging needed at time of follow-up: BMP to evaluate calcium   Follow-up Appointments: Follow-up Information     [name], MD. Go on 06/02/2019.   Specialty: Family Medicine Why: @2 :15pm Contact information: [address] [place] [address] [phone]            Hospital Course: Richard Hall is a 63 yo male with PMH of HD dependent ESRD, atrial fibrillation, hypertension, CVA, prostate cancer with bone mets requiring chronic foley catheter and hypothyroidism who presented to [place] ED on 12/12 for evaluation of dizziness that occurred during dialysis on morning of admission and resolved soon after admission.  Upon admission he was noted to have severe hypocalcemia (especially abnormal in setting of ESRD) which was the primary reason for the length of his hospitalization. Over the course of his hospitalization, he required numerous IV calcium infusions however he remained hypocalcemic. The cause of the hypocalcemia remains unclear. He was started on calcitriol for Vitamin D deficiency discovered on admission. We discussed this with nephrology who agreed that he will continue to have this monitored at his dialysis sessions.  Other causes for the hypocalcemia may include the osseous metastasis of his prostate cancer or prior possible Prolia (denosumab) injections which are known to cause severe hypocalcemia if given concomitantly with Vitamin D deficiency. It remains unclear if he ever did  receive prolia and several attempts were made to discuss this with the provider that is managing his prostate cancer. No information regarding this diagnosis was discovered in care everywhere. He was reportedly being seen at [facility] however, upon contacting them, they noted no prior records of him seeing oncology. His PCP was also contacted who was not aware that he had prostate cancer. Finally, an attempt was made to contact a urologist that was reportedly seeing him however was unable to reach him.    Discharge Vitals:   BP 113/75 (BP Location: Right Arm)   Pulse 63   Temp 97.8 F (36.6 C) (Oral)   Resp 18   Ht 6' (1.829 m)   Wt 109.1 kg   SpO2 96%   BMI 32.62 kg/m   Pertinent Labs, Studies, and Procedures:  BMP Latest Ref Rng & Units 05/17/2019 05/16/2019 05/16/2019  Glucose 70 - 99 mg/dL 163(H) 172(H) 130(H)  BUN 8 - 23 mg/dL 57(H) 56(H) 49(H)  Creatinine 0.61 - 1.24 mg/dL 4.88(H) 4.67(H) 4.36(H)  Sodium 135 - 145 mmol/L 136 136 138  Potassium 3.5 - 5.1 mmol/L 3.8 4.6 4.0  Chloride 98 - 111 mmol/L 98 99 99  CO2 22 - 32 mmol/L 22 21(L) 24  Calcium 8.9 - 10.3 mg/dL 6.7(L) 6.5(L) 6.5(L)   Vitamin D 12.7 (ref 30-100 ng/mL) PTH 152 (ref 15-65 pg/mL)  Signed: Mitzi Hansen, MD 05/17/2019, 7:44 PM   Pager: [phone]

## 2019-05-17 NOTE — Evaluation (Signed)
Occupational Therapy Evaluation Patient Details Name: Richard Hall MRN: [id number] DOB: 1955/06/09 Today's Date: 05/17/2019    History of Present Illness Patient is a 63 year old male admitted with general weakness, dizziness. PMH to include: Metastatic prostate Ca, OSA, DM, ESRD on HD, hypocalcemia, CVA, HTN   Clinical Impression   Pt admitted with the above diagnoses and presents with below problem list. Pt will benefit from continued acute OT to address the below listed deficits and maximize independence with basic ADLs prior to d/c home. PTA pt was mod I with ADLs. Lives alone, has transportation to/from HD. Pt currently min guard with LB ADLs, functional transfers and mobility.      Follow Up Recommendations  Home health OT;Supervision - Intermittent    Equipment Recommendations  None recommended by OT    Recommendations for Other Services       Precautions / Restrictions Precautions Precautions: Fall Restrictions Weight Bearing Restrictions: No      Mobility Bed Mobility Overal bed mobility: Modified Independent                Transfers Overall transfer level: Needs assistance Equipment used: Straight cane Transfers: Sit to/from Stand Sit to Stand: Min guard         General transfer comment: to/from EOB, to recliner. min guard for safety.     Balance Overall balance assessment: Needs assistance Sitting-balance support: No upper extremity supported;Feet supported Sitting balance-Leahy Scale: Good     Standing balance support: Single extremity supported;During functional activity;Bilateral upper extremity supported Standing balance-Leahy Scale: Poor Standing balance comment: SPC and occasionally reaching for external support with non cane-carrying hand.                            ADL either performed or assessed with clinical judgement   ADL Overall ADL's : Needs assistance/impaired Eating/Feeding: Set up;Sitting   Grooming: Set  up;Sitting   Upper Body Bathing: Set up;Sitting   Lower Body Bathing: Min guard;Sit to/from stand   Upper Body Dressing : Set up;Sitting   Lower Body Dressing: Min guard;Sit to/from stand   Toilet Transfer: Min guard;Ambulation(SPC)   Toileting- Water quality scientist and Hygiene: Min guard;Sit to/from stand   Tub/ Shower Transfer: Min guard;Ambulation;Shower seat(SPC)   Functional mobility during ADLs: Min guard;Cane General ADL Comments: Pt completed household distance functional mobility. Fatigued noted. Pt back from HD this morning.      Vision         Perception     Praxis      Pertinent Vitals/Pain Pain Assessment: No/denies pain     Hand Dominance Right   Extremity/Trunk Assessment Upper Extremity Assessment Upper Extremity Assessment: Generalized weakness;Overall [facility] for tasks assessed   Lower Extremity Assessment Lower Extremity Assessment: Defer to PT evaluation       Communication Communication Communication: No difficulties   Cognition Arousal/Alertness: Awake/alert Behavior During Therapy: Flat affect Overall Cognitive Status: Within Functional Limits for tasks assessed                                     General Comments       Exercises     Shoulder Instructions      Home Living Family/patient expects to be discharged to:: Private residence Living Arrangements: Alone Available Help at Discharge: Available PRN/intermittently Type of Home: Apartment Home Access: Level entry     Home  Layout: One level     Bathroom Shower/Tub: Teacher, early years/pre: Standard     Home Equipment: Cane - single point;Walker - 2 wheels;Shower seat   Additional Comments: uses SPC at baseline, reports he has walker if needed      Prior Functioning/Environment Level of Independence: Independent with assistive device(s)                 OT Problem List: Decreased activity tolerance;Impaired balance (sitting and/or  standing);Decreased knowledge of use of DME or AE;Decreased knowledge of precautions      OT Treatment/Interventions: Self-care/ADL training;Therapeutic exercise;Energy conservation;DME and/or AE instruction;Therapeutic activities;Patient/family education;Balance training    OT Goals(Current goals can be found in the care plan section) Acute Rehab OT Goals Patient Stated Goal: to go home OT Goal Formulation: With patient Time For Goal Achievement: 05/31/19 Potential to Achieve Goals: Good ADL Goals Pt Will Perform Grooming: with modified independence;sitting;standing Pt Will Perform Lower Body Bathing: with modified independence;sit to/from stand Pt Will Perform Lower Body Dressing: with modified independence;sit to/from stand Pt Will Transfer to Toilet: with modified independence;ambulating Pt Will Perform Toileting - Clothing Manipulation and hygiene: with modified independence;sit to/from stand Pt Will Perform Tub/Shower Transfer: Tub transfer;with modified independence;ambulating;shower seat  OT Frequency: Min 2X/week   Barriers to D/C:            Co-evaluation              AM-PAC OT "6 Clicks" Daily Activity     Outcome Measure Help from another person eating meals?: None Help from another person taking care of personal grooming?: None Help from another person toileting, which includes using toliet, bedpan, or urinal?: None Help from another person bathing (including washing, rinsing, drying)?: A Little Help from another person to put on and taking off regular upper body clothing?: None Help from another person to put on and taking off regular lower body clothing?: None 6 Click Score: 23   End of Session Equipment Utilized During Treatment: Gait belt(SPC)  Activity Tolerance: Patient limited by fatigue Patient left: in chair;with call bell/phone within reach  OT Visit Diagnosis: Unsteadiness on feet (R26.81);Muscle weakness (generalized) (M62.81)                 Time: 1898-4210 OT Time Calculation (min): 11 min Charges:  OT General Charges $OT Visit: 1 Visit OT Evaluation $OT Eval Low Complexity: [place], OT Acute Rehabilitation Services Pager: [phone] Office: [phone]   Hortencia Pilar 05/17/2019, 1:03 PM

## 2019-05-18 ENCOUNTER — Encounter (HOSPITAL_COMMUNITY): Payer: Self-pay | Admitting: Internal Medicine

## 2019-05-18 DIAGNOSIS — E039 Hypothyroidism, unspecified: Secondary | ICD-10-CM

## 2019-05-18 DIAGNOSIS — I120 Hypertensive chronic kidney disease with stage 5 chronic kidney disease or end stage renal disease: Secondary | ICD-10-CM

## 2019-05-18 DIAGNOSIS — Z79899 Other long term (current) drug therapy: Secondary | ICD-10-CM

## 2019-05-18 DIAGNOSIS — M84552A Pathological fracture in neoplastic disease, left femur, initial encounter for fracture: Secondary | ICD-10-CM

## 2019-05-18 DIAGNOSIS — C61 Malignant neoplasm of prostate: Secondary | ICD-10-CM

## 2019-05-18 DIAGNOSIS — I4891 Unspecified atrial fibrillation: Secondary | ICD-10-CM

## 2019-05-18 DIAGNOSIS — N186 End stage renal disease: Secondary | ICD-10-CM

## 2019-05-18 DIAGNOSIS — C7951 Secondary malignant neoplasm of bone: Secondary | ICD-10-CM

## 2019-05-18 DIAGNOSIS — Z7901 Long term (current) use of anticoagulants: Secondary | ICD-10-CM

## 2019-05-18 DIAGNOSIS — Z8673 Personal history of transient ischemic attack (TIA), and cerebral infarction without residual deficits: Secondary | ICD-10-CM

## 2019-05-18 DIAGNOSIS — Z992 Dependence on renal dialysis: Secondary | ICD-10-CM

## 2019-05-18 LAB — RENAL FUNCTION PANEL
Albumin: 2.7 g/dL — ABNORMAL LOW (ref 3.5–5.0)
Anion gap: 13 (ref 5–15)
BUN: 44 mg/dL — ABNORMAL HIGH (ref 8–23)
CO2: 23 mmol/L (ref 22–32)
Calcium: 7.2 mg/dL — ABNORMAL LOW (ref 8.9–10.3)
Chloride: 98 mmol/L (ref 98–111)
Creatinine, Ser: 4.56 mg/dL — ABNORMAL HIGH (ref 0.61–1.24)
GFR calc Af Amer: 15 mL/min — ABNORMAL LOW (ref >60)
GFR calc non Af Amer: 13 mL/min — ABNORMAL LOW (ref >60)
Glucose, Bld: 239 mg/dL — ABNORMAL HIGH (ref 70–99)
Phosphorus: 5.1 mg/dL — ABNORMAL HIGH (ref 2.5–4.6)
Potassium: 4.7 mmol/L (ref 3.5–5.1)
Sodium: 134 mmol/L — ABNORMAL LOW (ref 135–145)

## 2019-05-18 LAB — MRSA PCR SCREENING: MRSA by PCR: NEGATIVE

## 2019-05-18 LAB — CBC
HCT: 28 % — ABNORMAL LOW (ref 39.0–52.0)
Hemoglobin: 8.4 g/dL — ABNORMAL LOW (ref 13.0–17.0)
MCH: 28.8 pg (ref 26.0–34.0)
MCHC: 30 g/dL (ref 30.0–36.0)
MCV: 95.9 fL (ref 80.0–100.0)
Platelets: 236 10*3/uL (ref 150–400)
RBC: 2.92 MIL/uL — ABNORMAL LOW (ref 4.22–5.81)
RDW: 17.2 % — ABNORMAL HIGH (ref 11.5–15.5)
WBC: 12 10*3/uL — ABNORMAL HIGH (ref 4.0–10.5)
nRBC: 0.9 % — ABNORMAL HIGH (ref 0.0–0.2)

## 2019-05-18 LAB — APTT: aPTT: 31 seconds (ref 24–36)

## 2019-05-18 LAB — SAMPLE TO BLOOD BANK

## 2019-05-18 LAB — SARS CORONAVIRUS 2 (TAT 6-24 HRS): SARS Coronavirus 2: NEGATIVE

## 2019-05-18 LAB — PROTIME-INR
INR: 1.2 (ref 0.8–1.2)
Prothrombin Time: 15.3 seconds — ABNORMAL HIGH (ref 11.4–15.2)

## 2019-05-18 MED ORDER — SODIUM CHLORIDE 0.9 % IV BOLUS
500.0000 mL | Freq: Once | INTRAVENOUS | Status: AC
  Administered 2019-05-18: 500 mL via INTRAVENOUS

## 2019-05-18 MED ORDER — CHLORHEXIDINE GLUCONATE CLOTH 2 % EX PADS
6.0000 | MEDICATED_PAD | Freq: Every day | CUTANEOUS | Status: DC
  Administered 2019-05-18 – 2019-05-24 (×4): 6 via TOPICAL

## 2019-05-18 MED ORDER — CALCIUM GLUCONATE-NACL 1-0.675 GM/50ML-% IV SOLN
1.0000 g | Freq: Once | INTRAVENOUS | Status: AC
  Administered 2019-05-18: 1000 mg via INTRAVENOUS
  Filled 2019-05-18: qty 50

## 2019-05-18 MED ORDER — CHLORHEXIDINE GLUCONATE CLOTH 2 % EX PADS
6.0000 | MEDICATED_PAD | Freq: Every day | CUTANEOUS | Status: DC
  Administered 2019-05-20: 6 via TOPICAL

## 2019-05-18 MED ORDER — CEFAZOLIN SODIUM-DEXTROSE 2-4 GM/100ML-% IV SOLN: 2.0000 g | INTRAVENOUS | Status: DC

## 2019-05-18 NOTE — Progress Notes (Signed)
[facility] Progress Note  Subjective Patient readmitted yesterday after falling at home and sustaining closed distal femoral fracture.  Plans for repair on 12/17 per charting and per pt.   Review of systems:    Denies chest pain or shortness of breath  States nausea earlier - now resolved; no vomiting   Has been in bed   Vitals:   05/18/19 0100 05/18/19 0300 05/18/19 0410 05/18/19 0725  BP: 105/72 121/66 125/70 130/67  Pulse: 83 81 82 81  Resp: 13  14 16   Temp:   97.7 F (36.5 C) 97.7 F (36.5 C)  TempSrc:   Oral Oral  SpO2: 94% 92%  98%  Weight:   113.6 kg   Height:   6' (1.829 m)     Exam: Gen adult obese male in bed in NAD  Chest clear to auscultation and unlabored ; room air  RRR no MRG Abd soft nt/ obese habitus  Ext  no lower LE edema; left leg immobilized   Neuro is alert, Ox 3 , nf, no fasciculations  psych norma mood and affect  Access - LUE AVF bruit and thrill      Outpt HD: TTS [facility]  4h 77min  400/700  115kg  2K/3.5Ca bath  Heparin none   LFA AVF  - vit D 4ug tiw  - venofer 100 / hd    Assessment/ Plan: 1. Left distal femoral fracture   Repair per ortho   Please avoid any continuous fluids given ESRD pt   2. Hypocalcemia - improving.  Recent onset 1-2 mos ago per the patient. Was admitted on high doses of po vit D at HD, high ca bath and tums 1gm qid at home. No hx PTX.  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  Would not redose prolia if this was given.  - Continue PO calcium, high Ca bath and high dose vit D tiw.  - Would not provide any additional prolia if this was given - have asked that Dr. Zettie Pho office be notified in the event it was given by them; PCP, and [facility] have not given (no longer sees [facility]).  He has never been seen at  [facility] oncology   - IV calcium today   3. ESRD - on HD TTS schedule.  (Was previously off schedule with inpt volumes).  Would transition off of gemfibrozil and cymbalta given ESRD 4. HTN - acceptable control  5. DM2  Per primary team  6. Prostate cancer - f/b Dr Tresa Moore in [place] 7. HL - per primary team. Would transition off of gemfibrozil given his ESRD 8. Depression - per primary team  9. Anemia - ESRD.  No ESA in setting of malignancy for now.  S/p venofer with HD (nulceit here)   Inpatient medications: . amitriptyline  100 mg Oral QHS  . bicalutamide  50 mg Oral Daily  . [START ON 05/19/2019] calcitRIOL  4 mcg Oral Once per day on Tue Thu Sat  . calcium carbonate  2 tablet Oral TID WC & HS  . ferric citrate  420 mg Oral TID WC  . sertraline  50 mg Oral Daily    acetaminophen **OR** acetaminophen, oxyCODONE  Claudia Desanctis 05/18/2019 9:16 AM

## 2019-05-18 NOTE — Consult Note (Signed)
Reason for Consult:Left distal femur fx Referring Physician: Zealand Hall is an 63 y.o. male.  HPI: Richard Hall was at home after just being discharged from the hospital earlier that day. He went to the kitchen to get a glass of water and his cane slipped on a wet spot on the floor and he fell. He had immediate left knee pain and could not get up or bear weight. He was brought to the ED where x-rays showed a likely pathologic distal femur fx and orthopedic surgery was consulted. He was recently diagnosed with prostate ca with bony mets in the past few months. Given the complexity involved the orthopedic trauma specialist was asked to evaluate and assume care. He c/o localized pain in the area of the fx.  Past Medical History:  Diagnosis Date  . CVA (cerebral vascular accident) ([place]) 2010  . Diabetes mellitus with end-stage renal disease ([place])   . Dyslipidemia   . Essential hypertension   . Hypothyroidism (acquired)   . OSA on CPAP     Past Surgical History:  Procedure Laterality Date  . APPENDECTOMY    . TRANSURETHRAL RESECTION OF BLADDER TUMOR N/A 01/07/2019   Procedure: CYSTOSCOPY/ EVEACUATION CLOT;  Surgeon: Bjorn Loser, MD;  Location: [facility];  Service: Urology;  Laterality: N/A;    History reviewed. No pertinent family history.  Social History:  reports that he quit smoking about 23 years ago. He has never used smokeless tobacco. He reports that he does not drink alcohol or use drugs.  Allergies:  Allergies  Allergen Reactions  . Gabapentin Swelling    Medications: I have reviewed the patient's current medications.  Results for orders placed or performed during the hospital encounter of 05/17/19 (from the past 48 hour(s))  CBC     Status: Abnormal   Collection Time: 05/17/19  8:25 PM  Result Value Ref Range   WBC 8.6 4.0 - 10.5 K/uL   RBC 3.30 (L) 4.22 - 5.81 MIL/uL   Hemoglobin 9.5 (L) 13.0 - 17.0 g/dL   HCT 30.7 (L) 39.0 - 52.0 %   MCV 93.0 80.0 - 100.0 fL    MCH 28.8 26.0 - 34.0 pg   MCHC 30.9 30.0 - 36.0 g/dL   RDW 16.8 (H) 11.5 - 15.5 %   Platelets 260 150 - 400 K/uL   nRBC 1.0 (H) 0.0 - 0.2 %    Comment: Performed at [facility], [address], [place], [place] [address]  Basic metabolic panel     Status: Abnormal   Collection Time: 05/17/19  8:25 PM  Result Value Ref Range   Sodium 135 135 - 145 mmol/L   Potassium 4.5 3.5 - 5.1 mmol/L   Chloride 99 98 - 111 mmol/L   CO2 21 (L) 22 - 32 mmol/L   Glucose, Bld 259 (H) 70 - 99 mg/dL   BUN 39 (H) 8 - 23 mg/dL   Creatinine, Ser 4.37 (H) 0.61 - 1.24 mg/dL   Calcium 7.6 (L) 8.9 - 10.3 mg/dL   GFR calc non Af Amer 13 (L) >60 mL/min   GFR calc Af Amer 15 (L) >60 mL/min   Anion gap 15 5 - 15    Comment: Performed at [facility] [address], [place], [place] [address]  Protime-INR     Status: Abnormal   Collection Time: 05/17/19  8:25 PM  Result Value Ref Range   Prothrombin Time 15.3 (H) 11.4 - 15.2 seconds   INR  1.2 0.8 - 1.2    Comment: (NOTE) INR goal varies based on device and disease states. Performed at [facility], [place] [address], [place], [place] [address]   Sample to Blood Bank     Status: None   Collection Time: 05/17/19  8:25 PM  Result Value Ref Range   Blood Bank Specimen SAMPLE AVAILABLE FOR TESTING    Sample Expiration      05/18/2019,2359 Performed at [facility], [place] [address] [place], [place] [address]   CBC     Status: Abnormal   Collection Time: 05/18/19  2:44 AM  Result Value Ref Range   WBC 12.0 (H) 4.0 - 10.5 K/uL   RBC 2.92 (L) 4.22 - 5.81 MIL/uL   Hemoglobin 8.4 (L) 13.0 - 17.0 g/dL   HCT 28.0 (L) 39.0 - 52.0 %   MCV 95.9 80.0 - 100.0 fL   MCH 28.8 26.0 - 34.0 pg   MCHC 30.0 30.0 - 36.0 g/dL   RDW 17.2 (H) 11.5 - 15.5 %   Platelets 236 150 - 400 K/uL   nRBC 0.9 (H) 0.0 - 0.2 %    Comment: Performed at [facility] [address], [place], [place] [address]  Protime-INR     Status: Abnormal   Collection Time:  05/18/19  2:44 AM  Result Value Ref Range   Prothrombin Time 15.3 (H) 11.4 - 15.2 seconds   INR 1.2 0.8 - 1.2    Comment: (NOTE) INR goal varies based on device and disease states. Performed at [facility], [place] [address], [place], [place] [address]   APTT     Status: None   Collection Time: 05/18/19  2:44 AM  Result Value Ref Range   aPTT 31 24 - 36 seconds    Comment: Performed at [facility] [address], [place], [place] [address]  Renal function panel     Status: Abnormal   Collection Time: 05/18/19  2:44 AM  Result Value Ref Range   Sodium 134 (L) 135 - 145 mmol/L   Potassium 4.7 3.5 - 5.1 mmol/L   Chloride 98 98 - 111 mmol/L   CO2 23 22 - 32 mmol/L   Glucose, Bld 239 (H) 70 - 99 mg/dL   BUN 44 (H) 8 - 23 mg/dL   Creatinine, Ser 4.56 (H) 0.61 - 1.24 mg/dL   Calcium 7.2 (L) 8.9 - 10.3 mg/dL   Phosphorus 5.1 (H) 2.5 - 4.6 mg/dL   Albumin 2.7 (L) 3.5 - 5.0 g/dL   GFR calc non Af Amer 13 (L) >60 mL/min   GFR calc Af Amer 15 (L) >60 mL/min   Anion gap 13 5 - 15    Comment: Performed at [facility] [address] [place], [place] [address]  SARS CORONAVIRUS 2 (TAT 6-24 HRS) Nasopharyngeal Nasopharyngeal Swab     Status: None   Collection Time: 05/18/19  2:44 AM   Specimen: Nasopharyngeal Swab  Result Value Ref Range   SARS Coronavirus 2 NEGATIVE NEGATIVE    Comment: (NOTE) SARS-CoV-2 target nucleic acids are NOT DETECTED. The SARS-CoV-2 RNA is generally detectable in upper and lower respiratory specimens during the acute phase of infection. Negative results do not preclude SARS-CoV-2 infection, do not rule out co-infections with other pathogens, and should not be used as the sole basis for treatment or other patient management decisions. Negative results must be combined with clinical observations, patient history, and epidemiological information.  The expected result is Negative. Fact Sheet for  Patients: SugarRoll.be Fact Sheet for Healthcare Providers: https://www.woods-mathews.com/ This test is not yet approved or cleared by the [place] FDA and  has been authorized for detection and/or diagnosis of SARS-CoV-2 by FDA under an Emergency Use Authorization (EUA). This EUA will remain  in effect (meaning this test can be used) for the duration of the COVID-19 declaration under Section 56 4(b)(1) of the Act, 21 U.S.C. section 360bbb-3(b)(1), unless the authorization is terminated or revoked sooner. Performed at [facility], [place] [address], [place], [place] [address]   MRSA PCR Screening     Status: None   Collection Time: 05/18/19  4:12 AM   Specimen: Nasopharyngeal  Result Value Ref Range   MRSA by PCR NEGATIVE NEGATIVE    Comment:        The GeneXpert MRSA Assay (FDA approved for NASAL specimens only), is one component of a comprehensive MRSA colonization surveillance program. It is not intended to diagnose MRSA infection nor to guide or monitor treatment for MRSA infections. Performed at [facility], [place] [address], [place],  [address]     CT KNEE LEFT WO CONTRAST  Result Date: 05/17/2019 CLINICAL DATA:  Distal femoral fracture, history of metastatic prostate cancer EXAM: CT OF THE LEFT KNEE WITHOUT CONTRAST TECHNIQUE: Multidetector CT imaging of the LEFT knee was performed according to the standard protocol. Multiplanar CT image reconstructions were also generated. COMPARISON:  Same day radiographs FINDINGS: Bones/Joint/Cartilage There is diffuse bony demineralization. A cortically based sclerotic focus in the anterior distal femoral diaphysis measuring approximately 1.7 by 0.8 by 2.3 cm in size is likely a sclerotic focus of prostate metastasis corresponding well to the location of increased uptake on recent bone scan. Additional sclerotic focus is involved within an anteromedial fracture fragment.  Redemonstration of the comminuted, posteriorly displaced and angulated fracture of the distal femoral metadiaphysis with extension into the posterior intercondylar fossa. Some heterogeneous attenuation within the trabecular likely reflecting some associated intra osseous hemorrhage. There is a large effusion with lipohemarthrosis. Extensive surrounding soft tissue hemorrhage is present as well. There are advanced moderate to severe tricompartmental degenerative changes with medial compartmental narrowing and near bone-on-bone contact with subchondral sclerosis and cystic change. Similar periarticular hypertrophic spurring is noted along the patellofemoral articulation as well. Ligaments Suboptimally assessed by CT. Muscles and Tendons Intramuscular hemorrhage of the distal thigh musculature medially adjacent the comminuted fracture fragments. No tendon subluxation or discontinuity is seen. Soft tissues Extensive vascular calcium noted in the distal SFA, popliteal artery, TP trunk and anterior tibial artery. Circumferential soft tissue swelling adjacent the distal femoral fracture. IMPRESSION: 1. Comminuted, posteriorly displaced and angulated fracture of the distal femoral metadiaphysis with extension into the posterior intercondylar fossa. 2. A cortically based sclerotic focus in the anterior distal femoral diaphysis measuring 1.7 x 0.8 x 2.3 cm in size is likely a sclerotic focus of prostate metastasis corresponding well to the location of increased uptake on recent bone scan. Additional sclerotic focus is involved within an anteromedial fracture fragment. Suggesting at least some underlying pathologic component. 3. Diffuse bony demineralization with intra trabecular hemorrhage. 4. Large lipohemarthrosis. 5. Intramuscular hemorrhage of the distal thigh musculature medially adjacent the comminuted fracture fragments. 6. Moderate to severe tricompartmental degenerative changes. 7. Extensive vascular calcium noted in  the distal SFA, popliteal artery, TP trunk and anterior tibial artery. 8. Circumferential soft tissue swelling adjacent to the distal femoral fracture. Electronically Signed   By: March Rummage  [facility] M.D.   On: 05/17/2019 22:35   DG FEMUR MIN 2 VIEWS LEFT  Result Date: 05/17/2019 CLINICAL DATA:  Left upper leg pain after a fall today. Initial encounter. EXAM: LEFT FEMUR 2 VIEWS COMPARISON:  None. FINDINGS: The patient has an acute, mildly comminuted fracture of the distal diaphysis and metaphysis of the femur. There is dorsal angulation of the distal fragment of approximately 30 degrees. 1/2 shaft width posterior displacement of the distal fragment is also seen and there is fragment override of approximately 3 cm. The fracture does not appear to extend into the articular surface of the femur at the knee. Marked degenerative change about the knee is noted. Associated soft tissue swelling noted. IMPRESSION: Acute fracture of the distal femur as described above. Left knee osteoarthritis. Electronically Signed   By: Inge Rise M.D.   On: 05/17/2019 20:10    Review of Systems  HENT: Negative for ear discharge, ear pain, hearing loss and tinnitus.   Eyes: Negative for photophobia and pain.  Respiratory: Negative for cough and shortness of breath.   Cardiovascular: Negative for chest pain.  Gastrointestinal: Negative for abdominal pain, nausea and vomiting.  Genitourinary: Negative for dysuria, flank pain, frequency and urgency.  Musculoskeletal: Positive for arthralgias (Left knee). Negative for back pain, myalgias and neck pain.  Neurological: Negative for dizziness and headaches.  Hematological: Does not bruise/bleed easily.  Psychiatric/Behavioral: The patient is not nervous/anxious.    Blood pressure 130/67, pulse 81, temperature 97.7 F (36.5 C), temperature source Oral, resp. rate 16, height 6' (1.829 m), weight 113.6 kg, SpO2 98 %. Physical Exam  Constitutional: He appears well-developed and  well-nourished. No distress.  HENT:  Head: Normocephalic and atraumatic.  Eyes: Conjunctivae are normal. Right eye exhibits no discharge. Left eye exhibits no discharge. No scleral icterus.  Cardiovascular: Normal rate and regular rhythm.  Respiratory: Effort normal. No respiratory distress.  Musculoskeletal:     Cervical back: Normal range of motion.     Comments: LLE No traumatic wounds, ecchymosis, or rash  KI in place  No ankle effusion  Sens DPN, SPN, TN intact  Motor EHL, ext, flex, evers 5/5  DP 2+, PT 2+, No significant edema  Neurological: He is alert.  Skin: Skin is warm and dry. He is not diaphoretic.  Psychiatric: He has a normal mood and affect. His behavior is normal.    Assessment/Plan: Left pathologic distal femur fx -- Plan ORIF tomorrow by Dr. Marcelino Scot. Please keep NPO after MN. Multiple medical problems including ESRD on HD, afib, HTN, CVA, hypothyroidism prostate cancer with chronic foley and extensive bony metastasis -- per primary service    Lisette Abu, PA-C Orthopedic Surgery [phone] 05/18/2019, 12:14 PM

## 2019-05-18 NOTE — Progress Notes (Addendum)
   Subjective:  Pt was seen at bedside. Pt feels fine. He reports the pain in his leg is about a 4/10 and well-controlled on oxycodone. Pt verbalizes understanding of surgery scheduled for tomorrow.  Objective:  Vital signs in last 24 hours: Vitals:   05/18/19 0300 05/18/19 0410 05/18/19 0725 05/18/19 1326  BP: 121/66 125/70 130/67 135/72  Pulse: 81 82 81 81  Resp:  14 16 16   Temp:  97.7 F (36.5 C) 97.7 F (36.5 C) 98 F (36.7 C)  TempSrc:  Oral Oral Oral  SpO2: 92%  98% 90%  Weight:  113.6 kg    Height:  6' (1.829 m)     Weight change:   Intake/Output Summary (Last 24 hours) at 05/18/2019 1333 Last data filed at 05/18/2019 1053 Gross per 24 hour  Intake 1100 ml  Output --  Net 1100 ml   Physical Exam Constitutional:      General: He is not in acute distress.    Appearance: He is obese. He is not ill-appearing or diaphoretic.  Cardiovascular:     Rate and Rhythm: Normal rate and regular rhythm.     Pulses: Normal pulses.     Heart sounds: Normal heart sounds. No murmur. No friction rub. No gallop.   Pulmonary:     Effort: Pulmonary effort is normal.  Musculoskeletal:     Comments: Left femur wrapped in large brace  Skin:    General: Skin is warm and dry.  Neurological:     General: No focal deficit present.     Mental Status: He is alert. Mental status is at baseline.  Psychiatric:        Mood and Affect: Mood normal.    Labs: Hgb 8.4, Hct 28.0 Ca 7.2, albumin 2.7  Assessment/Plan:  Principal Problem:   Fracture Active Problems:   Essential hypertension   ESRD on hemodialysis (HCC)   Atrial fibrillation (HCC)   Hypocalcemia   Prostate cancer metastatic to bone [facility])  Mr. Pappalardo is a 60 male with history of ESRD on HD, afib, HTN, CVA, hypothyroidism prostate cancer with extensive bony metastasis who presented with leg pain after falling on it, found to have a distal comminuted fracture. Pt is hemodynamically stable and pain is  well-controlled.  Closed distal femur fracture: Fracture likely to be pathologic in setting of metastatic prostatae cancer. Pt has no neurological or vascular deficits distal to fracture.  - Ortho trauma consulted, greatly appreciate recs - Continue to hold Eliquis in anticipation of surgery, no need to bridge with heparin - Oxy IR PRN for pain - Tylenol PRN for pain or fever  Metastatic prostate cancer Hypocalcemia: Ca 7.2. hypocalcemia has improved since prior to discharge yesterday. Appreciate nephrology consulting again - Continue casodex, calcium carbonate, and calcitriol  ESRD on HD: Pt received HD yesterday without concern - Scheduled for HD tomorrow  Atrial fibrillation: Pt is in normal sinus rhythm, currently well controlled - Holding metoprolol due to low blood pressures - Holding Eliquis for surgery tomorrow  Hypertension: Blood pressure has been returning to normal levels: 062'B-762'G systolic and 31'D-17'O diastolic.  - Resume hydralazine, imdur, metoprolol, and nifedipine as pressures rise.  Diet: NPO at midnight DVT ppx: SCDs CODE: FULL    LOS: 1 day   Carin Primrose, Medical Student 05/18/2019, 1:33 PM

## 2019-05-18 NOTE — ED Notes (Signed)
PAGED DR Sherry Ruffing TO RN ANDRUW--Maya Scholer

## 2019-05-19 ENCOUNTER — Encounter (HOSPITAL_COMMUNITY): Payer: Self-pay | Admitting: Internal Medicine

## 2019-05-19 ENCOUNTER — Inpatient Hospital Stay (HOSPITAL_COMMUNITY): Payer: Medicare Other

## 2019-05-19 DIAGNOSIS — S72402A Unspecified fracture of lower end of left femur, initial encounter for closed fracture: Secondary | ICD-10-CM

## 2019-05-19 DIAGNOSIS — D649 Anemia, unspecified: Secondary | ICD-10-CM

## 2019-05-19 DIAGNOSIS — W1830XA Fall on same level, unspecified, initial encounter: Secondary | ICD-10-CM

## 2019-05-19 DIAGNOSIS — Z79890 Hormone replacement therapy (postmenopausal): Secondary | ICD-10-CM

## 2019-05-19 LAB — BASIC METABOLIC PANEL
Anion gap: 15 (ref 5–15)
Anion gap: 15 (ref 5–15)
BUN: 68 mg/dL — ABNORMAL HIGH (ref 8–23)
BUN: 68 mg/dL — ABNORMAL HIGH (ref 8–23)
CO2: 24 mmol/L (ref 22–32)
CO2: 22 mmol/L (ref 22–32)
Calcium: 6.4 mg/dL — CL (ref 8.9–10.3)
Calcium: 6.2 mg/dL — CL (ref 8.9–10.3)
Chloride: 94 mmol/L — ABNORMAL LOW (ref 98–111)
Chloride: 96 mmol/L — ABNORMAL LOW (ref 98–111)
Creatinine, Ser: 6.96 mg/dL — ABNORMAL HIGH (ref 0.61–1.24)
Creatinine, Ser: 7.11 mg/dL — ABNORMAL HIGH (ref 0.61–1.24)
GFR calc Af Amer: 9 mL/min — ABNORMAL LOW (ref >60)
GFR calc Af Amer: 9 mL/min — ABNORMAL LOW (ref >60)
GFR calc non Af Amer: 8 mL/min — ABNORMAL LOW (ref >60)
GFR calc non Af Amer: 7 mL/min — ABNORMAL LOW (ref >60)
Glucose, Bld: 299 mg/dL — ABNORMAL HIGH (ref 70–99)
Glucose, Bld: 200 mg/dL — ABNORMAL HIGH (ref 70–99)
Potassium: 4.3 mmol/L (ref 3.5–5.1)
Potassium: 4.5 mmol/L (ref 3.5–5.1)
Sodium: 133 mmol/L — ABNORMAL LOW (ref 135–145)
Sodium: 133 mmol/L — ABNORMAL LOW (ref 135–145)

## 2019-05-19 LAB — CBC
HCT: 22.9 % — ABNORMAL LOW (ref 39.0–52.0)
Hemoglobin: 7.2 g/dL — ABNORMAL LOW (ref 13.0–17.0)
MCH: 29 pg (ref 26.0–34.0)
MCHC: 31.4 g/dL (ref 30.0–36.0)
MCV: 92.3 fL (ref 80.0–100.0)
Platelets: 187 10*3/uL (ref 150–400)
RBC: 2.48 MIL/uL — ABNORMAL LOW (ref 4.22–5.81)
RDW: 17.1 % — ABNORMAL HIGH (ref 11.5–15.5)
WBC: 7.4 10*3/uL (ref 4.0–10.5)
nRBC: 1.4 % — ABNORMAL HIGH (ref 0.0–0.2)

## 2019-05-19 LAB — GLUCOSE, CAPILLARY: Glucose-Capillary: 178 mg/dL — ABNORMAL HIGH (ref 70–99)

## 2019-05-19 LAB — PREPARE RBC (CROSSMATCH)

## 2019-05-19 LAB — PROTIME-INR
INR: 1.2 (ref 0.8–1.2)
Prothrombin Time: 15.1 seconds (ref 11.4–15.2)

## 2019-05-19 LAB — MAGNESIUM: Magnesium: 2.1 mg/dL (ref 1.7–2.4)

## 2019-05-19 MED ORDER — INSULIN ASPART 100 UNIT/ML ~~LOC~~ SOLN: 0.0000 [IU] | Freq: Three times a day (TID) | SUBCUTANEOUS | Status: DC

## 2019-05-19 MED ORDER — ENSURE PRE-SURGERY PO LIQD
296.0000 mL | Freq: Once | ORAL | Status: AC
  Administered 2019-05-19: 296 mL via ORAL
  Filled 2019-05-19: qty 296

## 2019-05-19 MED ORDER — METOPROLOL TARTRATE 25 MG PO TABS
25.0000 mg | ORAL_TABLET | Freq: Every day | ORAL | Status: DC
  Administered 2019-05-19 – 2019-05-22 (×3): 25 mg via ORAL
  Filled 2019-05-19 (×3): qty 1

## 2019-05-19 MED ORDER — SODIUM CHLORIDE 0.9 % IV SOLN: INTRAVENOUS | Status: DC

## 2019-05-19 MED ORDER — CHLORHEXIDINE GLUCONATE 4 % EX LIQD
60.0000 mL | Freq: Once | CUTANEOUS | Status: AC
  Administered 2019-05-19: 4 via TOPICAL
  Filled 2019-05-19: qty 60

## 2019-05-19 MED ORDER — CEFAZOLIN SODIUM-DEXTROSE 2-4 GM/100ML-% IV SOLN
2.0000 g | INTRAVENOUS | Status: DC
  Filled 2019-05-19: qty 100

## 2019-05-19 MED ORDER — INSULIN ASPART 100 UNIT/ML ~~LOC~~ SOLN
0.0000 [IU] | Freq: Three times a day (TID) | SUBCUTANEOUS | Status: DC
  Administered 2019-05-19 – 2019-05-20 (×2): 3 [IU] via SUBCUTANEOUS

## 2019-05-19 MED ORDER — SODIUM CHLORIDE 0.9 % IV SOLN
125.0000 mg | Freq: Once | INTRAVENOUS | Status: AC
  Administered 2019-05-19: 125 mg via INTRAVENOUS
  Filled 2019-05-19: qty 10

## 2019-05-19 MED ORDER — SODIUM CHLORIDE 0.9% IV SOLUTION: Freq: Once | INTRAVENOUS | Status: DC

## 2019-05-19 MED ORDER — LEVOTHYROXINE SODIUM 75 MCG PO TABS
75.0000 ug | ORAL_TABLET | Freq: Every day | ORAL | Status: DC
  Administered 2019-05-21 – 2019-05-25 (×5): 75 ug via ORAL
  Filled 2019-05-19 (×5): qty 1

## 2019-05-19 MED ORDER — POVIDONE-IODINE 10 % EX SWAB
2.0000 "application " | Freq: Once | CUTANEOUS | Status: AC
  Administered 2019-05-19: 2 via TOPICAL

## 2019-05-19 MED ORDER — CALCITRIOL 0.5 MCG PO CAPS: 4.0000 ug | ORAL_CAPSULE | ORAL | Status: DC

## 2019-05-19 NOTE — Progress Notes (Signed)
CRITICAL VALUE ALERT  Critical Value:  Calcium:6.2  Date & Time Notied:  05/19/19 1005  Provider Notified: Dr. Altamese [place]  Orders Received/Actions taken:

## 2019-05-19 NOTE — TOC Initial Note (Signed)
Transition of Care [facility]) - Initial/Assessment Note    Patient Details  Name: Richard Hall MRN: [id number] Date of Birth: 10-13-1955  Transition of Care [facility]) CM/SW Contact:    Benard Halsted, LCSW Phone Number: 05/19/2019, 5:25 PM  Clinical Narrative:                 Patient is a 63 year-old man w/ a history of ESRD on HD, atrial fibrillation, HTN, CVA, and prostate cancer metastasized to bone who presented with leg pain 2/2 a ground-level fall causing a distal, comminuted left femur fracture.   TOC team is following patient for potential discharge needs due to extremely high hospital readmission score.     Barriers to Discharge: Continued Medical Work up   Patient Goals and CMS Choice        Expected Discharge Plan and Services         Living arrangements for the past 2 months: Apartment                                      Prior Living Arrangements/Services Living arrangements for the past 2 months: Apartment Lives with:: Self Patient language and need for interpreter reviewed:: Yes        Need for Family Participation in Patient Care: No (Comment) Care giver support system in place?: Yes (comment)   Criminal Activity/Legal Involvement Pertinent to Current Situation/Hospitalization: No - Comment as needed  Activities of Daily Living Home Assistive Devices/Equipment: None, CPAP, Bedside commode/3-in-1, Shower chair with back ADL Screening (condition at time of admission) Patient's cognitive ability adequate to safely complete daily activities?: No Is the patient deaf or have difficulty hearing?: No Does the patient have difficulty seeing, even when wearing glasses/contacts?: No Does the patient have difficulty concentrating, remembering, or making decisions?: No Patient able to express need for assistance with ADLs?: Yes Does the patient have difficulty dressing or bathing?: No Independently performs ADLs?: Yes (appropriate for developmental age) Does  the patient have difficulty walking or climbing stairs?: No Weakness of Legs: Left Weakness of Arms/Hands: None  Permission Sought/Granted                  Emotional Assessment Appearance:: Appears stated age     Orientation: : Oriented to Self, Oriented to Place, Oriented to  Time, Oriented to Situation Alcohol / Substance Use: Not Applicable Psych Involvement: No (comment)  Admission diagnosis:  Fracture [T14.8XXA] Fall [W19.XXXA] End stage renal disease on dialysis [facility]) [N18.6, Z99.2] Fall, initial encounter [W19.XXXA] 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 Prostate cancer metastatic to bone ([place]) 05/18/2019  . Fracture 05/17/2019  . Hypocalcemia 05/14/2019  . Bilateral leg edema 01/28/2019  . Symptomatic anemia 01/05/2019  . Gross hematuria 01/05/2019  . Gastrointestinal hemorrhage with melena 01/05/2019  . OSA on CPAP   . Hypothyroidism (acquired)   . Essential hypertension   . Dyslipidemia   . Diabetes mellitus with end-stage renal disease ([place])   . Acute blood loss anemia   . ESRD on hemodialysis ([place]) 08/13/2018  . ESRF (end stage renal failure) ([place]) 06/07/2018  . Chronic diastolic congestive heart failure ([place]) 02/09/2018  . Anemia, chronic renal failure, stage 4 (severe) ([place]) 11/09/2017  . Chronic respiratory failure with hypoxia and hypercapnia ([place]) 11/09/2017  . Iron deficiency anemia 10/28/2017  .  Atrial fibrillation ([place]) 10/07/2017  . Morbid obesity with BMI of 40.0-44.9, adult ([place]) 07/14/2016  . Diabetic peripheral neuropathy associated with type 2 diabetes mellitus ([place]) 10/10/2015  . Allergy 04/30/2015  . Bronchitis 04/30/2015  . ED (erectile dysfunction) of organic origin 04/30/2015  . Lipoma of back 04/30/2015  . Microalbuminuria 04/30/2015  . Osteoarthritis 04/30/2015  . Microalbuminuria due to type 2 diabetes mellitus ([place])  01/09/2014  . Chronic kidney disease (CKD), stage III (moderate) 07/22/2013  . Acute ill-defined cerebrovascular disease 04/04/2013  . Dizziness 04/04/2013  . Hypercholesterolemia 04/04/2013  . Hypoxemia 04/04/2013  . Insomnia 04/04/2013  . Periodic limb movement disorder 04/04/2013  . Diplopia 06/07/2012  . Personal history of transient ischemic attack (TIA), and cerebral infarction without residual deficits 05/19/2011   PCP:  [name], MD Pharmacy:   [facility] [address], [place] [address] Phone: [phone] Fax: [phone]     Social Determinants of Health (SDOH) Interventions    Readmission Risk Interventions Readmission Risk Prevention Plan 01/12/2019  Transportation Screening Complete  PCP or Specialist Appt within 3-5 Days Complete  HRI or [place] Complete  Social Work Consult for [place] Planning/Counseling Complete  Palliative Care Screening Not Applicable  Medication Review [employer]) Complete

## 2019-05-19 NOTE — Progress Notes (Signed)
   Vital Signs MEWS/VS Documentation      05/18/2019 2254 05/19/2019 0409 05/19/2019 0415 05/19/2019 0500   MEWS Score:  0  1  4  2    MEWS Score Color:  Green  Green  Red  Yellow   Resp:  20  13  --  (!) 22   Pulse:  90  94  --  --   BP:  (!) 112/59  (!) 144/66  --  110    Temp:  98.5 F (36.9 C)  98.2 F (36.8 C)  --  --   O2 Device:  Room [employer]  --  --      Pt is stable and asymptomatic. Says he is a little nervous about his surgery this morning, and is a little uncomfortable. Gave pain medication at 0446 and the MD was notified.    Cornelis Kluver J Diavian Furgason 05/19/2019,5:56 AM

## 2019-05-19 NOTE — Progress Notes (Signed)
[facility] Progress Note  Subjective Spoke with ortho today and given calcium dropping and glucose high they would prefer for repair on 12/18 and dialysis today.  We discussed risks,benefits, and indications for transfusion and he is ok with getting a unit of blood   Review of systems:  Denies chest pain or shortness of breath  NPO earlier for planned procedure - about to have a drink No n/v Has been in bed   Vitals:   05/18/19 2254 05/19/19 0409 05/19/19 0500 05/19/19 0723  BP: (!) 112/59 (!) 144/66  124/73  Pulse: 90 94    Resp: 20 13 (!) 22   Temp: 98.5 F (36.9 C) 98.2 F (36.8 C)  97.8 F (36.6 C)  TempSrc:  Oral  Oral  SpO2:  98% 91%   Weight:      Height:        Exam: Gen adult obese male in bed in NAD   Chest clear to auscultation and unlabored ; room air  RRR no MRG Abd soft nt/ obese habitus  Ext  no LE edema; left leg immobilized   Neuro is alert, Ox 3 , nf, no fasciculations  psych norma mood and affect  Access - LUE AVF bruit and thrill      Outpt HD: TTS [facility]  4h 16min  400/700  115kg  2K/3.5Ca bath  Heparin none   LFA AVF  - vit D 4ug tiw  - venofer 100 / hd    Assessment/ Plan: 1. Left distal femoral fracture   Repair per ortho - now scheduled for 12/18  Please avoid any continuous fluids given ESRD pt   2. Hypocalcemia - improving.  Recent onset 1-2 mos ago per the patient. Was admitted on high doses of po vit D at HD, high ca bath and tums 1gm qid at home. No hx PTX.  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  Would not redose prolia if this was given.  - Continue PO calcium, high Ca bath and high dose vit D tiw.  - Would not provide any additional prolia if this was given - have asked that Dr. Zettie Pho office be notified in the event it was given by  them; PCP, and [facility] have not given (no longer sees [facility]).  He has never been seen at [facility] oncology    3. ESRD - on HD TTS schedule.  Would transition off of gemfibrozil and cymbalta given ESRD 4. HTN - acceptable control  5. DM2  Per primary team  6. Prostate cancer - f/b Dr Tresa Moore in [place] 7. HL - per primary team. Would transition off of gemfibrozil given his ESRD 8. Depression - per primary team  9. Anemia - ESRD.  No ESA in setting of malignancy for now.  PRBC's x 1 unit with HD on 12/17.  S/p nulceit x 2 doses  Inpatient medications: . amitriptyline  100 mg Oral QHS  . bicalutamide  50 mg Oral Daily  . calcitRIOL  4 mcg Oral Once per day on Tue Thu Sat  . calcium carbonate  2 tablet Oral TID WC & HS  . Chlorhexidine Gluconate Cloth  6 each Topical Daily  . Chlorhexidine Gluconate Cloth  6 each Topical Q0600  . ferric citrate  420 mg Oral TID WC  . insulin  aspart  0-15 Units Subcutaneous TID WC  . sertraline  50 mg Oral Daily    acetaminophen **OR** acetaminophen, oxyCODONE  Claudia Desanctis 05/19/2019 10:49 AM

## 2019-05-19 NOTE — Progress Notes (Addendum)
Subjective:  Pt seen in his room this morning. Surgery was rescheduled for tomorrow. He is understanding and appears stable reporting that pain is well-controlled.   Objective:  Vital signs in last 24 hours: Vitals:   05/18/19 2254 05/19/19 0409 05/19/19 0500 05/19/19 0723  BP: (!) 112/59 (!) 144/66  124/73  Pulse: 90 94    Resp: 20 13 (!) 22   Temp: 98.5 F (36.9 C) 98.2 F (36.8 C)  97.8 F (36.6 C)  TempSrc:  Oral  Oral  SpO2:  98% 91%   Weight:      Height:       Weight change:   Intake/Output Summary (Last 24 hours) at 05/19/2019 1152 Last data filed at 05/19/2019 1000 Gross per 24 hour  Intake 360 ml  Output 1075 ml  Net -715 ml   Physical Exam Constitutional:      General: He is not in acute distress.    Appearance: He is not toxic-appearing.  Cardiovascular:     Rate and Rhythm: Normal rate and regular rhythm.     Pulses: Normal pulses.     Heart sounds: Normal heart sounds. No murmur. No friction rub. No gallop.      Comments: +2 pulses in all 4 extremities Pulmonary:     Effort: Pulmonary effort is normal.  Musculoskeletal:        General: No swelling.  Skin:    General: Skin is warm and dry.  Neurological:     General: No focal deficit present.     Mental Status: He is alert.     Sensory: No sensory deficit.     Comments: Pt has sensation in both lower extremities bilaterally  Psychiatric:        Mood and Affect: Mood normal.        Behavior: Behavior normal.    Labs: hgb 7.2 Ca 6.2, Na 133, Cl 96 Glucose 200  Assessment/Plan:  Principal Problem:   Fracture Active Problems:   Essential hypertension   ESRD on hemodialysis (HCC)   Atrial fibrillation (HCC)   Hypocalcemia   Prostate cancer metastatic to bone [facility])  Kalani Sthilaire is a 63 year-old man w/ a history of ESRD on HD, atrial fibrillation, HTN, CVA, and prostate cancer metastasized to bone who presented with leg pain 2/2 a ground-level fall causing a distal, comminuted left  femur fracture. Pt is in atrial fibrillation this morning. Hypocalcemia and anemia has worsened. Glucose is elevated. Pain is well-controlled.  Femoral fracture: Surgery postponed due to electrolyte abnormalities, anemia, and atrial fibrillation. Pt is neurovascularly intact and has no acute distress. - Ortho trauma consulted, greatly appreciate recs - Continue to hold eliquis - NPO at midnight tonight - Oxy IR PRN for pain - Tylenol PRN for pain or fever  Metastatic prostate cancer Hypocalcemia ESRD on HD: Ca 6.2. Hypocalcemia has worsened, will need increase in Ca replenishing therapy. - Nephrology on board, greatly appreciate recs - HD scheduled today, will include high Ca baths - Transfuse 1 unit of blood with HD - Repeat CBC in AM - continue casodex, calcium carbonate, and calcitriol  Atrial fibrillation: Pt is in atrial fibrillation this morning in the setting of holding his metoprolol at admission due to soft pressures - Restart metoprolol 25 mg daily - Holding eliquis prior to surgery  Hypertension: Pressures have normalized, are occasionally elevated to the 353'I systolic and 14'E diastolic - Resume metoprolol 25 mg daily  Hypothyroidism: - Restart home levothyroxine 75 mcg daily  Diet:  NPO at midnight DVT ppx: SCDs CODE: FULL    LOS: 2 days   Carin Primrose, Medical Student 05/19/2019, 11:52 AM

## 2019-05-19 NOTE — Progress Notes (Signed)
Inpatient Diabetes Program Recommendations  AACE/ADA: New Consensus Statement on Inpatient Glycemic Control (2015)  Target Ranges:  Prepandial:   less than 140 mg/dL      Peak postprandial:   less than 180 mg/dL (1-2 hours)      Critically ill patients:  140 - 180 mg/dL   Lab Results  Component Value Date   GLUCAP 178 (H) 05/19/2019   HGBA1C 7.8 (H) 05/14/2019    Review of Glycemic Control Results for Richard Hall, Richard Hall (MRN [id number]) as of 05/19/2019 11:34  Ref. Range 05/16/2019 21:23 05/17/2019 06:21 05/17/2019 11:08 05/19/2019 09:24  Glucose-Capillary Latest Ref Range: 70 - 99 mg/dL 184 (H) 147 (H) 168 (H) 178 (H)   Diabetes history: DM 2 Outpatient Diabetes medications:  Glucotrol 5 mg daily Current orders for Inpatient glycemic control:  Novolog moderate tid with meals   Inpatient Diabetes Program Recommendations:    Please consider reducing Novolog correction to sensitive due elevated creatinine.    Thanks  Adah Perl, RN, BC-ADM Inpatient Diabetes Coordinator Pager [phone]

## 2019-05-20 ENCOUNTER — Inpatient Hospital Stay (HOSPITAL_COMMUNITY): Payer: Medicare Other

## 2019-05-20 ENCOUNTER — Inpatient Hospital Stay (HOSPITAL_COMMUNITY): Payer: Medicare Other | Admitting: Anesthesiology

## 2019-05-20 ENCOUNTER — Encounter (HOSPITAL_COMMUNITY)
Admission: EM | Disposition: A | Discharge status: Skilled Nursing Facility | Payer: Self-pay | Source: Home / Self Care | Attending: Internal Medicine

## 2019-05-20 ENCOUNTER — Other Ambulatory Visit: Payer: Self-pay

## 2019-05-20 ENCOUNTER — Encounter (HOSPITAL_COMMUNITY): Payer: Self-pay | Admitting: Internal Medicine

## 2019-05-20 DIAGNOSIS — R739 Hyperglycemia, unspecified: Secondary | ICD-10-CM

## 2019-05-20 DIAGNOSIS — W19XXXA Unspecified fall, initial encounter: Secondary | ICD-10-CM

## 2019-05-20 HISTORY — PX: FEMUR IM NAIL: SHX1597

## 2019-05-20 HISTORY — PX: ORIF FEMUR FRACTURE: SHX2119

## 2019-05-20 LAB — CBC
HCT: 22.2 % — ABNORMAL LOW (ref 39.0–52.0)
HCT: 23.1 % — ABNORMAL LOW (ref 39.0–52.0)
HCT: 23.1 % — ABNORMAL LOW (ref 39.0–52.0)
Hemoglobin: 7.2 g/dL — ABNORMAL LOW (ref 13.0–17.0)
Hemoglobin: 7.6 g/dL — ABNORMAL LOW (ref 13.0–17.0)
Hemoglobin: 7.4 g/dL — ABNORMAL LOW (ref 13.0–17.0)
MCH: 29.6 pg (ref 26.0–34.0)
MCH: 30.2 pg (ref 26.0–34.0)
MCH: 29.8 pg (ref 26.0–34.0)
MCHC: 32.4 g/dL (ref 30.0–36.0)
MCHC: 32.9 g/dL (ref 30.0–36.0)
MCHC: 32 g/dL (ref 30.0–36.0)
MCV: 91.4 fL (ref 80.0–100.0)
MCV: 91.7 fL (ref 80.0–100.0)
MCV: 93.1 fL (ref 80.0–100.0)
Platelets: 191 10*3/uL (ref 150–400)
Platelets: 161 10*3/uL (ref 150–400)
Platelets: 162 10*3/uL (ref 150–400)
RBC: 2.43 MIL/uL — ABNORMAL LOW (ref 4.22–5.81)
RBC: 2.52 MIL/uL — ABNORMAL LOW (ref 4.22–5.81)
RBC: 2.48 MIL/uL — ABNORMAL LOW (ref 4.22–5.81)
RDW: 17 % — ABNORMAL HIGH (ref 11.5–15.5)
RDW: 16.9 % — ABNORMAL HIGH (ref 11.5–15.5)
RDW: 17 % — ABNORMAL HIGH (ref 11.5–15.5)
WBC: 7.2 10*3/uL (ref 4.0–10.5)
WBC: 8.9 10*3/uL (ref 4.0–10.5)
WBC: 8.6 10*3/uL (ref 4.0–10.5)
nRBC: 1.4 % — ABNORMAL HIGH (ref 0.0–0.2)
nRBC: 1 % — ABNORMAL HIGH (ref 0.0–0.2)
nRBC: 1.3 % — ABNORMAL HIGH (ref 0.0–0.2)

## 2019-05-20 LAB — PREPARE RBC (CROSSMATCH)

## 2019-05-20 LAB — BASIC METABOLIC PANEL
Anion gap: 13 (ref 5–15)
BUN: 40 mg/dL — ABNORMAL HIGH (ref 8–23)
CO2: 25 mmol/L (ref 22–32)
Calcium: 6.8 mg/dL — ABNORMAL LOW (ref 8.9–10.3)
Chloride: 96 mmol/L — ABNORMAL LOW (ref 98–111)
Creatinine, Ser: 4.86 mg/dL — ABNORMAL HIGH (ref 0.61–1.24)
GFR calc Af Amer: 14 mL/min — ABNORMAL LOW (ref >60)
GFR calc non Af Amer: 12 mL/min — ABNORMAL LOW (ref >60)
Glucose, Bld: 215 mg/dL — ABNORMAL HIGH (ref 70–99)
Potassium: 4.1 mmol/L (ref 3.5–5.1)
Sodium: 134 mmol/L — ABNORMAL LOW (ref 135–145)

## 2019-05-20 LAB — GLUCOSE, CAPILLARY
Glucose-Capillary: 241 mg/dL — ABNORMAL HIGH (ref 70–99)
Glucose-Capillary: 211 mg/dL — ABNORMAL HIGH (ref 70–99)
Glucose-Capillary: 203 mg/dL — ABNORMAL HIGH (ref 70–99)
Glucose-Capillary: 244 mg/dL — ABNORMAL HIGH (ref 70–99)
Glucose-Capillary: 264 mg/dL — ABNORMAL HIGH (ref 70–99)

## 2019-05-20 SURGERY — OPEN REDUCTION INTERNAL FIXATION (ORIF) DISTAL FEMUR FRACTURE
Anesthesia: General | Site: Leg Upper | Laterality: Left

## 2019-05-20 MED ORDER — PROPOFOL 10 MG/ML IV BOLUS
INTRAVENOUS | Status: AC
  Filled 2019-05-20: qty 20

## 2019-05-20 MED ORDER — SUGAMMADEX SODIUM 200 MG/2ML IV SOLN
INTRAVENOUS | Status: DC | PRN
  Administered 2019-05-20: 300 mg via INTRAVENOUS

## 2019-05-20 MED ORDER — PHENYLEPHRINE 40 MCG/ML (10ML) SYRINGE FOR IV PUSH (FOR BLOOD PRESSURE SUPPORT)
PREFILLED_SYRINGE | INTRAVENOUS | Status: AC
  Filled 2019-05-20: qty 10

## 2019-05-20 MED ORDER — OXYCODONE HCL 5 MG PO TABS
5.0000 mg | ORAL_TABLET | Freq: Once | ORAL | Status: AC | PRN
  Administered 2019-05-20: 5 mg via ORAL

## 2019-05-20 MED ORDER — PHENYLEPHRINE HCL (PRESSORS) 10 MG/ML IV SOLN
INTRAVENOUS | Status: AC
  Filled 2019-05-20: qty 1

## 2019-05-20 MED ORDER — HYDROMORPHONE HCL 1 MG/ML IJ SOLN
INTRAMUSCULAR | Status: AC
  Filled 2019-05-20: qty 1

## 2019-05-20 MED ORDER — 0.9 % SODIUM CHLORIDE (POUR BTL) OPTIME
TOPICAL | Status: DC | PRN
  Administered 2019-05-20: 1000 mL

## 2019-05-20 MED ORDER — FENTANYL CITRATE (PF) 100 MCG/2ML IJ SOLN
25.0000 ug | INTRAMUSCULAR | Status: DC | PRN
  Administered 2019-05-20: 25 ug via INTRAVENOUS
  Administered 2019-05-20: 50 ug via INTRAVENOUS

## 2019-05-20 MED ORDER — ROCURONIUM BROMIDE 10 MG/ML (PF) SYRINGE
PREFILLED_SYRINGE | INTRAVENOUS | Status: AC
  Filled 2019-05-20: qty 10

## 2019-05-20 MED ORDER — INSULIN ASPART 100 UNIT/ML ~~LOC~~ SOLN
0.0000 [IU] | Freq: Three times a day (TID) | SUBCUTANEOUS | Status: DC
  Administered 2019-05-21: 8 [IU] via SUBCUTANEOUS
  Administered 2019-05-22: 3 [IU] via SUBCUTANEOUS
  Administered 2019-05-22 (×2): 5 [IU] via SUBCUTANEOUS
  Administered 2019-05-23 (×2): 3 [IU] via SUBCUTANEOUS
  Administered 2019-05-23: 5 [IU] via SUBCUTANEOUS
  Administered 2019-05-24: 8 [IU] via SUBCUTANEOUS
  Administered 2019-05-24 – 2019-05-25 (×3): 3 [IU] via SUBCUTANEOUS

## 2019-05-20 MED ORDER — OXYCODONE HCL 5 MG PO TABS
ORAL_TABLET | ORAL | Status: AC
  Filled 2019-05-20: qty 1

## 2019-05-20 MED ORDER — FENTANYL CITRATE (PF) 100 MCG/2ML IJ SOLN
INTRAMUSCULAR | Status: AC
  Filled 2019-05-20: qty 2

## 2019-05-20 MED ORDER — DEXAMETHASONE SODIUM PHOSPHATE 10 MG/ML IJ SOLN
INTRAMUSCULAR | Status: AC
  Filled 2019-05-20: qty 1

## 2019-05-20 MED ORDER — INSULIN ASPART 100 UNIT/ML ~~LOC~~ SOLN
0.0000 [IU] | Freq: Every day | SUBCUTANEOUS | Status: DC
  Administered 2019-05-20 – 2019-05-22 (×3): 3 [IU] via SUBCUTANEOUS
  Administered 2019-05-23: 2 [IU] via SUBCUTANEOUS

## 2019-05-20 MED ORDER — MIDAZOLAM HCL 2 MG/2ML IJ SOLN
INTRAMUSCULAR | Status: AC
  Filled 2019-05-20: qty 2

## 2019-05-20 MED ORDER — FENTANYL CITRATE (PF) 250 MCG/5ML IJ SOLN
INTRAMUSCULAR | Status: DC | PRN
  Administered 2019-05-20: 25 ug via INTRAVENOUS
  Administered 2019-05-20: 50 ug via INTRAVENOUS
  Administered 2019-05-20: 25 ug via INTRAVENOUS
  Administered 2019-05-20 (×2): 50 ug via INTRAVENOUS
  Administered 2019-05-20 (×2): 25 ug via INTRAVENOUS

## 2019-05-20 MED ORDER — SODIUM CHLORIDE 0.9% IV SOLUTION: Freq: Once | INTRAVENOUS | Status: AC

## 2019-05-20 MED ORDER — ONDANSETRON HCL 4 MG/2ML IJ SOLN
INTRAMUSCULAR | Status: DC | PRN
  Administered 2019-05-20: 4 mg via INTRAVENOUS

## 2019-05-20 MED ORDER — OXYCODONE HCL 5 MG PO TABS
5.0000 mg | ORAL_TABLET | ORAL | Status: DC | PRN
  Administered 2019-05-20 – 2019-05-25 (×16): 5 mg via ORAL
  Filled 2019-05-20 (×16): qty 1

## 2019-05-20 MED ORDER — LIDOCAINE 2% (20 MG/ML) 5 ML SYRINGE
INTRAMUSCULAR | Status: AC
  Filled 2019-05-20: qty 5

## 2019-05-20 MED ORDER — CEFAZOLIN SODIUM-DEXTROSE 2-3 GM-%(50ML) IV SOLR
INTRAVENOUS | Status: DC | PRN
  Administered 2019-05-20: 2 g via INTRAVENOUS

## 2019-05-20 MED ORDER — PROMETHAZINE HCL 25 MG/ML IJ SOLN: 6.2500 mg | INTRAMUSCULAR | Status: DC | PRN

## 2019-05-20 MED ORDER — ONDANSETRON HCL 4 MG/2ML IJ SOLN
INTRAMUSCULAR | Status: AC
  Filled 2019-05-20: qty 2

## 2019-05-20 MED ORDER — FENTANYL CITRATE (PF) 250 MCG/5ML IJ SOLN
INTRAMUSCULAR | Status: AC
  Filled 2019-05-20: qty 5

## 2019-05-20 MED ORDER — PROPOFOL 10 MG/ML IV BOLUS
INTRAVENOUS | Status: DC | PRN
  Administered 2019-05-20: 150 mg via INTRAVENOUS

## 2019-05-20 MED ORDER — OXYCODONE HCL 5 MG/5ML PO SOLN: 5.0000 mg | Freq: Once | ORAL | Status: AC | PRN

## 2019-05-20 MED ORDER — PHENYLEPHRINE 40 MCG/ML (10ML) SYRINGE FOR IV PUSH (FOR BLOOD PRESSURE SUPPORT)
PREFILLED_SYRINGE | INTRAVENOUS | Status: DC | PRN
  Administered 2019-05-20: 120 ug via INTRAVENOUS
  Administered 2019-05-20 (×2): 80 ug via INTRAVENOUS

## 2019-05-20 MED ORDER — METOPROLOL TARTRATE 5 MG/5ML IV SOLN
5.0000 mg | INTRAVENOUS | Status: AC
  Administered 2019-05-20: 5 mg via INTRAVENOUS
  Filled 2019-05-20: qty 5

## 2019-05-20 MED ORDER — SODIUM CHLORIDE 0.9 % IV SOLN: INTRAVENOUS | Status: DC | PRN

## 2019-05-20 MED ORDER — ROCURONIUM BROMIDE 10 MG/ML (PF) SYRINGE
PREFILLED_SYRINGE | INTRAVENOUS | Status: DC | PRN
  Administered 2019-05-20: 100 mg via INTRAVENOUS

## 2019-05-20 MED ORDER — PHENYLEPHRINE HCL-NACL 10-0.9 MG/250ML-% IV SOLN
INTRAVENOUS | Status: DC | PRN
  Administered 2019-05-20: 15 ug/min via INTRAVENOUS

## 2019-05-20 MED ORDER — SUGAMMADEX SODIUM 500 MG/5ML IV SOLN
INTRAVENOUS | Status: AC
  Filled 2019-05-20: qty 5

## 2019-05-20 MED ORDER — HYDROMORPHONE HCL 1 MG/ML IJ SOLN
0.2500 mg | INTRAMUSCULAR | Status: DC | PRN
  Administered 2019-05-20 (×4): 0.5 mg via INTRAVENOUS

## 2019-05-20 MED ORDER — METOPROLOL TARTRATE 5 MG/5ML IV SOLN
INTRAVENOUS | Status: AC
  Filled 2019-05-20: qty 5

## 2019-05-20 MED ORDER — CEFAZOLIN SODIUM-DEXTROSE 2-4 GM/100ML-% IV SOLN
INTRAVENOUS | Status: AC
  Filled 2019-05-20: qty 100

## 2019-05-20 MED ORDER — METOPROLOL TARTRATE 5 MG/5ML IV SOLN
INTRAVENOUS | Status: DC | PRN
  Administered 2019-05-20: 1 mg via INTRAVENOUS
  Administered 2019-05-20 (×2): 2 mg via INTRAVENOUS

## 2019-05-20 MED ORDER — HEPARIN SODIUM (PORCINE) 5000 UNIT/ML IJ SOLN
5000.0000 [IU] | Freq: Three times a day (TID) | INTRAMUSCULAR | Status: DC
  Administered 2019-05-20 – 2019-05-22 (×5): 5000 [IU] via SUBCUTANEOUS
  Filled 2019-05-20 (×5): qty 1

## 2019-05-20 MED ORDER — ALBUTEROL SULFATE HFA 108 (90 BASE) MCG/ACT IN AERS
INHALATION_SPRAY | RESPIRATORY_TRACT | Status: AC
  Filled 2019-05-20: qty 6.7

## 2019-05-20 MED ORDER — LIDOCAINE 2% (20 MG/ML) 5 ML SYRINGE
INTRAMUSCULAR | Status: DC | PRN
  Administered 2019-05-20: 100 mg via INTRAVENOUS

## 2019-05-20 MED ORDER — DEXAMETHASONE SODIUM PHOSPHATE 10 MG/ML IJ SOLN
INTRAMUSCULAR | Status: DC | PRN
  Administered 2019-05-20: 8 mg via INTRAVENOUS

## 2019-05-20 SURGICAL SUPPLY — 87 items
BIT DRILL 4.3 (BIT) ×2
BIT DRILL 4.3X300MM (BIT) IMPLANT
BIT DRILL LONG 3.3 (BIT) ×2 IMPLANT
BIT DRILL QC 3.3X195 (BIT) ×1 IMPLANT
BLADE CLIPPER SURG (BLADE) IMPLANT
BNDG ELASTIC 4X5.8 VLCR STR LF (GAUZE/BANDAGES/DRESSINGS) ×2 IMPLANT
BNDG ELASTIC 6X5.8 VLCR STR LF (GAUZE/BANDAGES/DRESSINGS) ×2 IMPLANT
BNDG GAUZE ELAST 4 BULKY (GAUZE/BANDAGES/DRESSINGS) ×2 IMPLANT
BRUSH SCRUB EZ PLAIN DRY (MISCELLANEOUS) ×4 IMPLANT
CANISTER SUCT 3000ML PPV (MISCELLANEOUS) ×2 IMPLANT
CONT SPEC 4OZ CLIKSEAL STRL BL (MISCELLANEOUS) ×1 IMPLANT
COVER SURGICAL LIGHT HANDLE (MISCELLANEOUS) ×2 IMPLANT
COVER WAND RF STERILE (DRAPES) ×2 IMPLANT
DRAPE C-ARM 42X72 X-RAY (DRAPES) ×2 IMPLANT
DRAPE C-ARMOR (DRAPES) ×2 IMPLANT
DRAPE IMP U-DRAPE 54X76 (DRAPES) ×2 IMPLANT
DRAPE ORTHO SPLIT 77X108 STRL (DRAPES) ×3
DRAPE SURG ORHT 6 SPLT 77X108 (DRAPES) ×3 IMPLANT
DRAPE U-SHAPE 47X51 STRL (DRAPES) ×2 IMPLANT
DRSG ADAPTIC 3X8 NADH LF (GAUZE/BANDAGES/DRESSINGS) ×2 IMPLANT
DRSG MEPILEX BORDER 4X12 (GAUZE/BANDAGES/DRESSINGS) ×1 IMPLANT
DRSG MEPILEX BORDER 4X4 (GAUZE/BANDAGES/DRESSINGS) ×1 IMPLANT
DRSG MEPILEX BORDER 4X8 (GAUZE/BANDAGES/DRESSINGS) ×2 IMPLANT
DRSG PAD ABDOMINAL 8X10 ST (GAUZE/BANDAGES/DRESSINGS) ×8 IMPLANT
ELECT REM PT RETURN 9FT ADLT (ELECTROSURGICAL) ×2
ELECTRODE REM PT RTRN 9FT ADLT (ELECTROSURGICAL) ×1 IMPLANT
EVACUATOR 1/8 PVC DRAIN (DRAIN) IMPLANT
EVACUATOR 3/16  PVC DRAIN (DRAIN)
EVACUATOR 3/16 PVC DRAIN (DRAIN) IMPLANT
GAUZE SPONGE 4X4 12PLY STRL (GAUZE/BANDAGES/DRESSINGS) ×2 IMPLANT
GAUZE SPONGE 4X4 12PLY STRL LF (GAUZE/BANDAGES/DRESSINGS) ×2 IMPLANT
GLOVE BIO SURGEON STRL SZ7.5 (GLOVE) ×2 IMPLANT
GLOVE BIO SURGEON STRL SZ8 (GLOVE) ×2 IMPLANT
GLOVE BIOGEL PI IND STRL 7.5 (GLOVE) ×1 IMPLANT
GLOVE BIOGEL PI IND STRL 8 (GLOVE) ×1 IMPLANT
GLOVE BIOGEL PI INDICATOR 7.5 (GLOVE) ×1
GLOVE BIOGEL PI INDICATOR 8 (GLOVE) ×1
GOWN STRL REUS W/ TWL LRG LVL3 (GOWN DISPOSABLE) ×2 IMPLANT
GOWN STRL REUS W/ TWL XL LVL3 (GOWN DISPOSABLE) ×1 IMPLANT
GOWN STRL REUS W/TWL LRG LVL3 (GOWN DISPOSABLE) ×2
GOWN STRL REUS W/TWL XL LVL3 (GOWN DISPOSABLE) ×1
GUIDEPIN 3.2X17.5 THRD DISP (PIN) ×2 IMPLANT
GUIDEWIRE BALL NOSE 100CM (WIRE) ×1 IMPLANT
HIP FRAC NAIL LAG SCR 10.5X100 (Orthopedic Implant) ×1 IMPLANT
K-WIRE 2.0 (WIRE) ×3
K-WIRE FXSTD 280X2XNS SS (WIRE) ×3
KIT BASIN OR (CUSTOM PROCEDURE TRAY) ×2 IMPLANT
KIT TURNOVER KIT B (KITS) ×2 IMPLANT
KWIRE FXSTD 280X2XNS SS (WIRE) IMPLANT
NAIL HIP LH 9X260 130D (Nail) ×1 IMPLANT
NEEDLE 22X1 1/2 (OR ONLY) (NEEDLE) IMPLANT
NS IRRIG 1000ML POUR BTL (IV SOLUTION) ×2 IMPLANT
PACK TOTAL JOINT (CUSTOM PROCEDURE TRAY) ×2 IMPLANT
PACK UNIVERSAL I (CUSTOM PROCEDURE TRAY) ×2 IMPLANT
PAD ABD 8X10 STRL (GAUZE/BANDAGES/DRESSINGS) ×2 IMPLANT
PAD ARMBOARD 7.5X6 YLW CONV (MISCELLANEOUS) ×4 IMPLANT
PAD CAST 4YDX4 CTTN HI CHSV (CAST SUPPLIES) ×1 IMPLANT
PADDING CAST COTTON 4X4 STRL (CAST SUPPLIES) ×1
PADDING CAST COTTON 6X4 STRL (CAST SUPPLIES) ×3 IMPLANT
SCREW CANN THRD AFF 10.5X100 (Orthopedic Implant) IMPLANT
SCREW CORT NCB SELFTAP 5.0X42 (Screw) ×1 IMPLANT
SCREW CORTICAL 5.0X95MM (Screw) ×2 IMPLANT
SCREW CORTICAL NCB 5.0X44 (Screw) ×7 IMPLANT
SCREW CORTICAL NCB 5.0X90MM (Screw) ×3 IMPLANT
SCREW NCB 4.0MX55M (Screw) ×1 IMPLANT
SCREW NCB 4.0MX65M (Screw) ×1 IMPLANT
SCREW NCB 5.0X46MM (Screw) ×1 IMPLANT
SCREW UNI 5.0 12MM (Screw) ×1 IMPLANT
SCREW UNI CORTICAL 5.0X14MM (Screw) ×2 IMPLANT
SCREW UNICORTICAL 5.0X14 (Screw) IMPLANT
SPONGE LAP 18X18 RF (DISPOSABLE) ×2 IMPLANT
STAPLER VISISTAT 35W (STAPLE) ×2 IMPLANT
SUCTION FRAZIER HANDLE 10FR (MISCELLANEOUS) ×1
SUCTION TUBE FRAZIER 10FR DISP (MISCELLANEOUS) ×1 IMPLANT
SUT BONE WAX W31G (SUTURE) ×1 IMPLANT
SUT PROLENE 0 CT 2 (SUTURE) IMPLANT
SUT VIC AB 0 CT1 27 (SUTURE) ×3
SUT VIC AB 0 CT1 27XBRD ANBCTR (SUTURE) ×2 IMPLANT
SUT VIC AB 1 CT1 27 (SUTURE) ×2
SUT VIC AB 1 CT1 27XBRD ANBCTR (SUTURE) ×2 IMPLANT
SUT VIC AB 2-0 CT1 27 (SUTURE) ×3
SUT VIC AB 2-0 CT1 TAPERPNT 27 (SUTURE) ×2 IMPLANT
SYR 20ML ECCENTRIC (SYRINGE) IMPLANT
TOWEL GREEN STERILE (TOWEL DISPOSABLE) ×4 IMPLANT
TOWEL GREEN STERILE FF (TOWEL DISPOSABLE) ×2 IMPLANT
TRAY FOLEY MTR SLVR 16FR STAT (SET/KITS/TRAYS/PACK) IMPLANT
WATER STERILE IRR 1000ML POUR (IV SOLUTION) ×4 IMPLANT

## 2019-05-20 NOTE — Progress Notes (Signed)
   Vital Signs MEWS/VS Documentation      05/20/2019 0011 05/20/2019 0016 05/20/2019 0413 05/20/2019 0623   MEWS Score:  1  --  3  4   MEWS Score Color:  Green  --  Yellow  Red   Resp:  --  --  --  (!) 21   Pulse:  --  --  (!) 131  (!) 129   BP:  --  --  --  117/72   Temp:  99 F (37.2 C)  --  98.7 F (37.1 C)  98.2 F (36.8 C)   O2 Device:  --  Room Air  Room Air  Room Air      Pt HR has been 120s-130s all night due to history of Afib and holding medications. So no acute change. Pt is asymptomatic and is scheduled for surgery today. MD notified with no new orders.      Christiaan Strebeck J Esten Dollar 05/20/2019,6:51 AM

## 2019-05-20 NOTE — Transfer of Care (Signed)
Immediate Anesthesia Transfer of Care Note  Patient: Richard Hall  Procedure(s) Performed: OPEN REDUCTION INTERNAL FIXATION (ORIF) DISTAL FEMUR FRACTURE with intercondylar extension (Left Leg Upper) Intramedullary (Im) Retrograde Femoral Nailing for impending pathologic fracture (Left Leg Upper)  Patient Location: PACU  Anesthesia Type:General  Level of Consciousness: awake, patient cooperative and responds to stimulation  Airway & Oxygen Therapy: Patient Spontanous Breathing and Patient connected to face mask oxygen  Post-op Assessment: Report given to RN and Post -op Vital signs reviewed and stable  Post vital signs: Reviewed and stable  Last Vitals:  Vitals Value Taken Time  BP 156/83 05/20/19 1446  Temp    Pulse 83 05/20/19 1449  Resp 34 05/20/19 1450  SpO2 100 % 05/20/19 1449  Vitals shown include unvalidated device data.  Last Pain:  Vitals:   05/20/19 0830  TempSrc: Oral  PainSc: 3          Complications: No apparent anesthesia complications

## 2019-05-20 NOTE — Anesthesia Preprocedure Evaluation (Signed)
Anesthesia Evaluation  Patient identified by MRN, date of birth, ID band Patient awake    Reviewed: Allergy & Precautions, NPO status , Patient's Chart, lab work & pertinent test results  Airway Mallampati: III  TM Distance: >3 FB Neck ROM: Full    Dental  (+) Dental Advisory Given, Upper Dentures, Partial Lower   Pulmonary sleep apnea and Continuous Positive Airway Pressure Ventilation , former smoker,    Pulmonary exam normal breath sounds clear to auscultation       Cardiovascular hypertension, Pt. on medications and Pt. 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  Reproductive/Obstetrics                             Anesthesia Physical  Anesthesia Plan  ASA: IV  Anesthesia Plan: General   Post-op Pain Management:    Induction: Intravenous  PONV Risk Score and Plan: 3 and Ondansetron, Dexamethasone, Treatment may vary due to age or medical condition and Midazolam  Airway Management Planned: Oral ETT  Additional Equipment:   Intra-op Plan:   Post-operative Plan: Extubation in OR  Informed Consent: I have reviewed the patients History and Physical, chart, labs and discussed the procedure including the risks, benefits and alternatives for the proposed anesthesia with the patient or authorized representative who has  indicated his/her understanding and acceptance.     Dental advisory given  Plan Discussed with: CRNA  Anesthesia Plan Comments:         Anesthesia Quick Evaluation

## 2019-05-20 NOTE — Anesthesia Postprocedure Evaluation (Signed)
Anesthesia Post Note  Patient: Richard Hall  Procedure(s) Performed: OPEN REDUCTION INTERNAL FIXATION (ORIF) DISTAL FEMUR FRACTURE with intercondylar extension (Left Leg Upper) Intramedullary (Im) Retrograde Femoral Nailing for impending pathologic fracture (Left Leg Upper)     Patient location during evaluation: PACU Anesthesia Type: General Level of consciousness: awake and alert Pain management: pain level controlled Vital Signs Assessment: post-procedure vital signs reviewed and stable Respiratory status: spontaneous breathing, nonlabored ventilation and respiratory function stable Cardiovascular status: blood pressure returned to baseline and stable Postop Assessment: no apparent nausea or vomiting Anesthetic complications: no    Last Vitals:  Vitals:   05/20/19 1550 05/20/19 1605  BP: 138/75 (!) 143/75  Pulse: 82 85  Resp: 14 16  Temp:    SpO2: 100% 98%    Last Pain:  Vitals:   05/20/19 1648  TempSrc:   PainSc: 1                  Lynda Rainwater

## 2019-05-20 NOTE — Progress Notes (Signed)
I have reviewed and discussed results of CT scan which shows significant involvement of the left hip in addition to the left femur fracture. Consequently we will proceed with combined ORIF and IM nailing. Patient has provided consent.  Altamese Mayaguez, MD [facility], [facility] [phone]

## 2019-05-20 NOTE — Progress Notes (Signed)
Patient ID: Richard Hall, male   DOB: 07-26-1955, 63 y.o.   MRN: [id number] S: tolerated surgery and in no discomfort O:BP 135/77   Pulse 83   Temp 98.2 F (36.8 C)   Resp 15   Ht 6' (1.829 m)   Wt 114.4 kg   SpO2 97%   BMI 34.21 kg/m   Intake/Output Summary (Last 24 hours) at 05/20/2019 1833 Last data filed at 05/20/2019 1520 Gross per 24 hour  Intake 1090 ml  Output 1050 ml  Net 40 ml   Intake/Output: I/O last 3 completed shifts: In: 240 [P.O.:240] Out: 3150 [Urine:1725; Other:1425]  Intake/Output this shift:  Total I/O In: 850 [P.O.:50; I.V.:800] Out: 400 [Urine:300; Blood:100] Weight change:  Gen:NAD CVS: no rub Resp: cta Abd: +BS, soft, NT/ND Ext: no edema, LAVF +T/B  Recent Labs  Lab 05/14/19 1406 05/15/19 0405 05/15/19 1627 05/16/19 1615 05/17/19 0554 05/17/19 2025 05/18/19 0244 05/19/19 0614 05/19/19 0855 05/20/19 0307  NA  --  141 133* 136 136 135 134* 133* 133* 134*  K  --  4.1 4.0 4.6 3.8 4.5 4.7 4.3 4.5 4.1  CL  --  104 95* 99 98 99 98 94* 96* 96*  CO2  --  19* 24 21* 22 21* 23 24 22 25   GLUCOSE  --  67* 295* 172* 163* 259* 239* 299* 200* 215*  BUN  --  70* 36* 56* 57* 39* 44* 68* 68* 40*  CREATININE  --  5.00* 3.38* 4.67* 4.88* 4.37* 4.56* 6.96* 7.11* 4.86*  ALBUMIN 2.7* 2.6* 2.6*  --   --   --  2.7*  --   --   --   CALCIUM  --  4.9* 6.6* 6.5* 6.7* 7.6* 7.2* 6.4* 6.2* 6.8*  PHOS  --  7.8*  --   --   --   --  5.1*  --   --   --   AST 18  --  21  --   --   --   --   --   --   --   ALT 15  --  16  --   --   --   --   --   --   --    Liver Function Tests: Recent Labs  Lab 05/14/19 1406 05/15/19 0405 05/15/19 1627 05/18/19 0244  AST 18  --  21  --   ALT 15  --  16  --   ALKPHOS 1,167*  --  1,274*  --   BILITOT 0.9  --  0.8  --   PROT 7.0  --  7.1  --   ALBUMIN 2.7* 2.6* 2.6* 2.7*   No results for input(s): LIPASE, AMYLASE in the last 168 hours. No results for input(s): AMMONIA in the last 168 hours. CBC: Recent Labs  Lab  05/17/19 0842 05/17/19 2025 05/18/19 0244 05/19/19 0300 05/20/19 0307  WBC 7.5 8.6 12.0* 7.4 7.2  HGB 9.6* 9.5* 8.4* 7.2* 7.2*  HCT 30.9* 30.7* 28.0* 22.9* 22.2*  MCV 93.1 93.0 95.9 92.3 91.4  PLT 230 260 236 187 191   Cardiac Enzymes: No results for input(s): CKTOTAL, CKMB, CKMBINDEX, TROPONINI in the last 168 hours. CBG: Recent Labs  Lab 05/19/19 0924 05/19/19 1819 05/19/19 2100 05/20/19 1454 05/20/19 1649  GLUCAP 178* 241* 211* 203* 244*    Iron Studies: No results for input(s): IRON, TIBC, TRANSFERRIN, FERRITIN in the last 72 hours. Studies/Results: CT PELVIS WO CONTRAST  Result Date: 05/19/2019 CLINICAL DATA:  Suspected left femur metastasis from prostate cancer. EXAM: CT PELVIS WITHOUT CONTRAST TECHNIQUE: Multidetector CT imaging of the pelvis was performed following the standard protocol without intravenous contrast. COMPARISON:  CT abdomen pelvis dated January 05, 2019. FINDINGS: Urinary Tract: No abnormality visualized. Bladder is decompressed by Foley catheter. Bowel:  Unremarkable visualized pelvic bowel loops. Vascular/Lymphatic: No pathologically enlarged lymph nodes. No significant vascular abnormality seen. Aortoiliac atherosclerotic vascular disease. Reproductive:  No mass or other significant abnormality. Other:  None. Musculoskeletal: New diffuse patchy sclerosis throughout the lower lumbar spine, pelvis, and both proximal to mid femurs. Unchanged acute comminuted fracture of the distal left femoral metadiaphysis extending into the intercondylar fossa with surrounding intramuscular hemorrhage. Advanced bilateral knee osteoarthritis. IMPRESSION: 1. Widespread osseous metastatic disease throughout the lower lumbar spine, pelvis, and both proximal to mid femurs, corresponding to increased uptake seen on bone scan from a month ago. These are new when compared to prior CT from August. 2. Unchanged acute comminuted fracture of the distal left femur. Electronically Signed   By:  Titus Dubin M.D.   On: 05/19/2019 18:05   DG C-Arm 1-60 Min  Result Date: 05/20/2019 CLINICAL DATA:  Open reduction and internal fixation of distal left femur fracture. EXAM: LEFT FEMUR 2 VIEWS; DG C-ARM 1-60 MIN COMPARISON:  Radiographs dated 05/17/2019 FINDINGS: AP and lateral C-arm images demonstrate an intramedullary nail and lag screw in the proximal femur and side plate and multiple screws across the comminuted fracture of the distal femur. The hardware appears in excellent position. Alignment and position of the multiple fracture fragments is markedly improved and near anatomic. IMPRESSION: Open reduction and internal fixation of comminuted distal femur fracture. FLUOROSCOPY TIME:  3 minutes 5 seconds C-arm fluoroscopic images were obtained intraoperatively and submitted for post operative interpretation. Electronically Signed   By: Lorriane Shire M.D.   On: 05/20/2019 14:58   DG FEMUR MIN 2 VIEWS LEFT  Result Date: 05/20/2019 CLINICAL DATA:  Open reduction and internal fixation of distal left femur fracture. EXAM: LEFT FEMUR 2 VIEWS; DG C-ARM 1-60 MIN COMPARISON:  Radiographs dated 05/17/2019 FINDINGS: AP and lateral C-arm images demonstrate an intramedullary nail and lag screw in the proximal femur and side plate and multiple screws across the comminuted fracture of the distal femur. The hardware appears in excellent position. Alignment and position of the multiple fracture fragments is markedly improved and near anatomic. IMPRESSION: Open reduction and internal fixation of comminuted distal femur fracture. FLUOROSCOPY TIME:  3 minutes 5 seconds C-arm fluoroscopic images were obtained intraoperatively and submitted for post operative interpretation. Electronically Signed   By: Lorriane Shire M.D.   On: 05/20/2019 14:58   DG FEMUR PORT MIN 2 VIEWS LEFT  Result Date: 05/20/2019 CLINICAL DATA:  63 year old male with metastatic prostate cancer. Impending proximal femur fracture and distal  femur fracture with intercondylar extension. Status post left femur ORIF postoperative day zero EXAM: LEFT FEMUR PORTABLE 2 VIEWS COMPARISON:  CT left knee 05/17/2019. Intraoperative radiographs earlier today. FINDINGS: Portable AP and cross-table lateral views. Proximal femur intramedullary rod within interlocking proximal dynamic hip screw, and interlocking distal cortical screw in conjunction with lateral mid and distal femur plate and multiple screws terminating at the femoral condyles. Hardware appears intact. Displaced butterfly fragments of the medial distal femoral metadiaphysis appear stable. Grossly preserved alignment at the left hip and knee. Postoperative soft tissue gas in the left thigh. Superimposed calcified peripheral vascular disease. IMPRESSION: Left femur ORIF with no adverse features identified. Electronically Signed   By:  Genevie Ann M.D.   On: 05/20/2019 16:49   . sodium chloride   Intravenous Once  . amitriptyline  100 mg Oral QHS  . bicalutamide  50 mg Oral Daily  . calcitRIOL  4 mcg Oral Once per day on Tue Thu Sat  . calcitRIOL  4 mcg Oral Once per day on Mon Wed Fri  . calcium carbonate  2 tablet Oral TID WC & HS  . Chlorhexidine Gluconate Cloth  6 each Topical Daily  . Chlorhexidine Gluconate Cloth  6 each Topical Q0600  . fentaNYL      . ferric citrate  420 mg Oral TID WC  . heparin injection (subcutaneous)  5,000 Units Subcutaneous Q8H  . HYDROmorphone      . HYDROmorphone      . insulin aspart  0-9 Units Subcutaneous TID WC  . levothyroxine  75 mcg Oral Q0600  . metoprolol tartrate  25 mg Oral Daily  . oxyCODONE      . sertraline  50 mg Oral Daily    BMET    Component Value Date/Time   NA 134 (L) 05/20/2019 0307   K 4.1 05/20/2019 0307   CL 96 (L) 05/20/2019 0307   CO2 25 05/20/2019 0307   GLUCOSE 215 (H) 05/20/2019 0307   BUN 40 (H) 05/20/2019 0307   CREATININE 4.86 (H) 05/20/2019 0307   CALCIUM 6.8 (L) 05/20/2019 0307   GFRNONAA 12 (L) 05/20/2019 0307    GFRAA 14 (L) 05/20/2019 0307   CBC    Component Value Date/Time   WBC 7.2 05/20/2019 0307   RBC 2.43 (L) 05/20/2019 0307   HGB 7.2 (L) 05/20/2019 0307   HCT 22.2 (L) 05/20/2019 0307   PLT 191 05/20/2019 0307   MCV 91.4 05/20/2019 0307   MCH 29.6 05/20/2019 0307   MCHC 32.4 05/20/2019 0307   RDW 17.0 (H) 05/20/2019 0307   LYMPHSABS 1.6 01/12/2019 0424   MONOABS 1.1 (H) 01/12/2019 0424   EOSABS 0.2 01/12/2019 0424   BASOSABS 0.0 01/12/2019 0424   Outpt HD:TTS [facility] 4h 56min 400/700 115kg 2K/3.5Ca bath Heparin none LFA AVF - vit D 4ug tiw - venofer 100 / hd  Assessment/Plan:  1. Left distal femur fracture s/p ORIF today by Dr. Marcelino Scot.  Tolerated surgery well and is without significant discomfort 2. ESRD- cont with TTS schedule and high Ca bath  3. Hypocalcemia- on po calcium, high dose vit D and added Ca bath with HD. 4. HTN- stable 5. DM per primary 6. ABLA on anemia of ckd stage 5- post op drop in hgb.  Transfuse prn.  No ESA in setting of malignancy 7. Metastatic prostate cancer- per primary.  Donetta Potts, MD [employer] [phone]

## 2019-05-20 NOTE — Brief Op Note (Signed)
05/20/2019  2:51 PM  PATIENT:  Richard Hall  63 y.o. male  PRE-OPERATIVE DIAGNOSIS:   1. METASTATIC PROSTATE CANCER 2. LEFT DISTAL FEMUR FRACTURE WITH INTERCONDYLAR EXTENSION 3. IMPENDING LEFT PROXIMAL FEMUR FRACTURE  POST-OPERATIVE DIAGNOSIS:  1. METASTATIC PROSTATE CANCER 2. LEFT DISTAL FEMUR FRACTURE WITH INTERCONDYLAR EXTENSION 3. IMPENDING LEFT PROXIMAL FEMUR FRACTURE  PROCEDURE:  Procedure(s): 1. OPEN REDUCTION INTERNAL FIXATION (ORIF) DISTAL FEMUR FRACTURE WITH INTERCONDYLAR EXTENSION 2. INTRAMEDULLARY NAILING OF LEFT PROXIMAL FEMUR WITH BIOMET AFFIXUS 9 X 260 MM STATICALLY LOCKED NAIL  SURGEON:  Surgeon(s) and Role:    * Altamese Vernon, MD - Primary  PHYSICIAN ASSISTANT: Ainsley Spinner, PA-C  ANESTHESIA:   general  EBL:  100 mL   BLOOD ADMINISTERED:none  DRAINS: none   LOCAL MEDICATIONS USED:  NONE  SPECIMEN:  Source of Specimen:  REAMINGS OF LEFT PROXIMAL FEMUR  DISPOSITION OF SPECIMEN:  PATHOLOGY  COUNTS:  YES  TOURNIQUET:  * No tourniquets in log *  DICTATION: .Other Dictation: Dictation Number [id number]  PLAN OF CARE: Admit to inpatient   PATIENT DISPOSITION:  PACU - hemodynamically stable.   Delay start of Pharmacological VTE agent (>24hrs) due to surgical blood loss or risk of bleeding: no

## 2019-05-20 NOTE — Op Note (Signed)
Richard Hall, Richard Hall MEDICAL RECORD ER:[id number] ACCOUNT [id number] DATE OF BIRTH:1956-01-02 FACILITY: [facility] LOCATION: [facility] PHYSICIAN:Deshannon Seide H. Rheya Minogue, MD  OPERATIVE REPORT  DATE OF PROCEDURE:  05/20/2019  PREOPERATIVE DIAGNOSES: 1.  Metastatic prostate cancer. 2.  Left distal femur supracondylar fracture with intercondylar extension. 3.  Impending left proximal femur fracture involving the shaft and peritrochanteric area.  POSTOPERATIVE DIAGNOSES 1.  Metastatic prostate cancer. 2.  Left distal femur supracondylar fracture with intercondylar extension. 3.  Impending left proximal femur fracture involving the shaft and peritrochanteric area.  PROCEDURES: 1.  Open reduction internal fixation of left distal femur fracture with intercondylar extension using a Biomet NCB plate. 2.  Intramedullary nailing of the left proximal femur fracture with Biomet Affixus 9 x 260 mm statically locked nail.  SURGEON:  Altamese [place], MD  ASSISTANT:  Ainsley Spinner, PA-C  ANESTHESIA:  General.  ESTIMATED BLOOD LOSS:  100 mL.  DRAINS:  None.  SPECIMENS:  Reamings of the left proximal femur sent to pathology.  TOURNIQUET:  None.  DISPOSITION:  To PACU.  CONDITION:  Stable.  BRIEF SUMMARY AND INDICATIONS:  The patient is a very pleasant 63 year old male with multiple medical problems including metastatic prostate cancer, end-stage renal disease and diabetes among others.  The patient and I discussed the risks and benefits of  surgical treatment of his displaced supracondylar fracture as well as stabilization of his impending proximal femur fracture including risk of nonunion, malunion, need for further surgery, DVT, PE, infection, nerve injury, vessel injury and multiple  others.  After full discussion, he did wish to proceed.  BRIEF SUMMARY OF PROCEDURE:  The patient was taken to the operating room after administration of preoperative antibiotics.  His left lower extremity was prepped and draped  in the usual sterile fashion and then Betadine scrub and paint was performed after  initial chlorhexidine wash.  A standard prep and drape was performed.  Timeout held.  We began with the distal femur, placing the leg on a radiolucent bump with towels underneath the knee.  A lateral approach was made through an 8 cm incision and then  splitting of the IT band plate was slid proximally and reduction dialed in on AP and lateral views.  This was pinned provisionally making sure the plate was in the appropriate space and then we had adequate plate length proximally to overlapped with the  anticipated antegrade nail.  Once this was secured with pins distally and a single drill bit proximally, we placed additional fixation and dialed this in further.  My assistant was pulling maximal traction on the leg and controlling angulation.  We ended  up with five bicortical screws distally and these were further stabilized with locking caps.  We made transient use of a lag screw into the metaphyseal flare and later we threw that screw and then placed 2 bicortical screws proximal to the fracture site  that ended up being distal to the nail.  We also placed 2 partially threaded unicortical screws in the most proximal holes.  These screws overlap with the nail and we did place locking caps over the heads of these as well.  Ainsley Spinner, PA-C, was present  and assisting throughout.  We then turned our attention to the proximal femoral shaft and trochanteric region.  Here, a 3 cm incision was made proximal to the greater trochanter.  A curved cannulated awl advanced just medial to the tip of the greater trochanter.  The threaded  guidewire was advanced in the center-center position on  AP and lateral views.  This was then over reamed.  The bone was extremely dense, chalky and a poorly vascularized.  I had to stop multiple times and clean the drill.  Because of the hardness of the  bone, I did readjust the wire to make certain  that we were central within the canal more distally.  I then sent some of these reamings to pathology.  This was followed by sequential reaming of the shaft up to 11 mm and then we placed a 9 x 260 mm rod  giving [place] overlap with the plate, but not interference with the screws distally.  We checked the view on the lateral to make sure the center-center position with the lag screw into the head, which ended up being exactly 100 mm and the telescoping set  screw within the proximal aspect of the nail was fully tightened down to make a fixed angle construct for maximum stability.  I then placed a locking bolt distally through the NCB plate across the slotted hole of the nail into the medial cortex of the  femur.  Final images showed this to be in appropriate position.  All wounds were irrigated thoroughly after final AP and lateral x-rays.  Ainsley Spinner, PA-C, was present and assisting throughout and he was necessary to hold the leg in adduction stabilized  during reaming and fixation.  Also assisted with simultaneous wound closure to further expedite time in the OR.    PROGNOSIS:  The patient will be nonweightbearing with unrestricted range of motion of the knee and hip.  He is at increased risk for thromboembolic complications because of his current malignancy as well as a host of other difficulties including  nonunion, need for further surgery, and others.  We will plan to see him back in the office after discharge around 2 weeks for removal of his sutures and new x-rays at that time.  He does have a contralateral proximal femur involvement and so we will  continue to follow that as well.  We are hopeful we can avoid surgery, but if he develops symptoms, it would be prudent to proceed with repair before full fracture.  TN/NUANCE  D:05/20/2019 T:05/20/2019 JOB:[id number]

## 2019-05-20 NOTE — Progress Notes (Signed)
OR called to inform this writer that the patient will be going to OR.  Writer to assist with transport to OR.  Will give report upon arrival to the OR.  PRBCs continue to infuse.  Patient is tolerating infusion well.  No s/sx of a reaction.

## 2019-05-20 NOTE — Progress Notes (Addendum)
   Subjective:  Overnight, Mr. Stegman nurse reports that he was in Afib and tachycardic to the 110-130's.   Pt seen at the bedside. He reports his pain is about a 3-4/10. He says the pain medicine does keep it controlled.   Objective:  Vital signs in last 24 hours: Vitals:   05/20/19 0623 05/20/19 0754 05/20/19 0830 05/20/19 0913  BP: 117/72 106/83    Pulse: (!) 129     Resp: (!) 21     Temp: 98.2 F (36.8 C) 97.9 F (36.6 C) 98 F (36.7 C)   TempSrc: Oral Oral Oral   SpO2: 97% 94%    Weight:    114.4 kg  Height:    6' (1.829 m)   Weight change:   Intake/Output Summary (Last 24 hours) at 05/20/2019 1021 Last data filed at 05/20/2019 0017 Gross per 24 hour  Intake 240 ml  Output 2075 ml  Net -1835 ml   Physical Exam Constitutional:      General: He is not in acute distress.    Appearance: He is not toxic-appearing.  Cardiovascular:     Rate and Rhythm: Normal rate and regular rhythm.     Pulses: Normal pulses.     Heart sounds: Normal heart sounds.     Comments: +2 pulses in all 4 extremities Skin:    General: Skin is warm and dry.  Neurological:     General: No focal deficit present.     Mental Status: He is alert. Mental status is at baseline.     Comments: Good sensation in lower extremities bilaterally  Psychiatric:        Mood and Affect: Mood normal.    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 Pt continues to be in atrial fibrillation. Hypocalcemia slightly improved. Anemia remains unchanged. His hyperglycemia  slightly improved.   Femur fracture: Surgery is planned for this morning. Will manage atrial fibrillation and anemia to optimize him for surgery. He never received his 1 unit RBC transfusion from yesterday, so that will be given today. - Ortho trauma on board, greatly appreciate recs - Transfuse 1 unit RBC's - Oxycodone IR PRN for pain - Tylenol PRN for pain or fever  Metastatic prostate cancer Hypocalcemia Anemia ESRD on HD: Ca 6.8. Received HD yesterday without problems. - Nephrology on board, greatly appreciate recs - Repeat CBC in AM - Continue casodex, calcium carbonate, and calcitriol  Atrial fibrillation: Pt remains in atrial fibrillation, tachycardic overnight. He has only received one dose of his metoprolol since this admission began - Give IV metoprolol 5mg  one-time, then will resume oral metoprolol after surgery  Hypertension: Pressures have returned to normal - Continue to monitor - restart meds following surgery   LOS: 3 days   Carin Primrose, Medical Student 05/20/2019, 10:21 AM

## 2019-05-20 NOTE — Progress Notes (Signed)
Preliminary note:  Alerted to the admission of this 63 y/o Richard Hall man with a history of prostate cancer followed by Dr Tresa Moore. Patient had a bone scan ordered by Dr Tresa Moore 11/16/202 showing metastatic disease to bone. It is not clear what plans urology may have initiated after those results were obtained.   Patient is undergoing ORIF/ nailing of left hip today.  Note patient has a hematologist who follows him for anemia in [place] and also gets much of his care in [place]. We have 2 oncology partners in [place] (Drs Hinton Rao and Bobby Rumpf) and in [place] Dr Alen Blew generally sees prostate cases.  If Dr Tresa Moore has not already made other arrangements I will be glad to facilitate obtaining outpatient medical oncology evaluation for Richard Hall here, in [place] or in [place]. Please let me know how I can be of further help.

## 2019-05-21 DIAGNOSIS — D638 Anemia in other chronic diseases classified elsewhere: Secondary | ICD-10-CM

## 2019-05-21 DIAGNOSIS — E1122 Type 2 diabetes mellitus with diabetic chronic kidney disease: Secondary | ICD-10-CM

## 2019-05-21 DIAGNOSIS — D62 Acute posthemorrhagic anemia: Secondary | ICD-10-CM

## 2019-05-21 DIAGNOSIS — Z9889 Other specified postprocedural states: Secondary | ICD-10-CM

## 2019-05-21 DIAGNOSIS — G893 Neoplasm related pain (acute) (chronic): Secondary | ICD-10-CM

## 2019-05-21 LAB — RENAL FUNCTION PANEL
Albumin: 2.3 g/dL — ABNORMAL LOW (ref 3.5–5.0)
Anion gap: 14 (ref 5–15)
BUN: 65 mg/dL — ABNORMAL HIGH (ref 8–23)
CO2: 25 mmol/L (ref 22–32)
Calcium: 6.2 mg/dL — CL (ref 8.9–10.3)
Chloride: 96 mmol/L — ABNORMAL LOW (ref 98–111)
Creatinine, Ser: 6.23 mg/dL — ABNORMAL HIGH (ref 0.61–1.24)
GFR calc Af Amer: 10 mL/min — ABNORMAL LOW (ref >60)
GFR calc non Af Amer: 9 mL/min — ABNORMAL LOW (ref >60)
Glucose, Bld: 166 mg/dL — ABNORMAL HIGH (ref 70–99)
Phosphorus: 5.8 mg/dL — ABNORMAL HIGH (ref 2.5–4.6)
Potassium: 4.6 mmol/L (ref 3.5–5.1)
Sodium: 135 mmol/L (ref 135–145)

## 2019-05-21 LAB — CBC
HCT: 21.9 % — ABNORMAL LOW (ref 39.0–52.0)
HCT: 25.9 % — ABNORMAL LOW (ref 39.0–52.0)
Hemoglobin: 7 g/dL — ABNORMAL LOW (ref 13.0–17.0)
Hemoglobin: 8.4 g/dL — ABNORMAL LOW (ref 13.0–17.0)
MCH: 29.8 pg (ref 26.0–34.0)
MCH: 29.9 pg (ref 26.0–34.0)
MCHC: 32 g/dL (ref 30.0–36.0)
MCHC: 32.4 g/dL (ref 30.0–36.0)
MCV: 93.2 fL (ref 80.0–100.0)
MCV: 92.2 fL (ref 80.0–100.0)
Platelets: 172 10*3/uL (ref 150–400)
Platelets: 186 10*3/uL (ref 150–400)
RBC: 2.35 MIL/uL — ABNORMAL LOW (ref 4.22–5.81)
RBC: 2.81 MIL/uL — ABNORMAL LOW (ref 4.22–5.81)
RDW: 17.4 % — ABNORMAL HIGH (ref 11.5–15.5)
RDW: 17 % — ABNORMAL HIGH (ref 11.5–15.5)
WBC: 7.5 10*3/uL (ref 4.0–10.5)
WBC: 7.5 10*3/uL (ref 4.0–10.5)
nRBC: 2.1 % — ABNORMAL HIGH (ref 0.0–0.2)
nRBC: 1.7 % — ABNORMAL HIGH (ref 0.0–0.2)

## 2019-05-21 LAB — GLUCOSE, CAPILLARY
Glucose-Capillary: 144 mg/dL — ABNORMAL HIGH (ref 70–99)
Glucose-Capillary: 115 mg/dL — ABNORMAL HIGH (ref 70–99)
Glucose-Capillary: 290 mg/dL — ABNORMAL HIGH (ref 70–99)
Glucose-Capillary: 267 mg/dL — ABNORMAL HIGH (ref 70–99)

## 2019-05-21 LAB — HEPATITIS B SURFACE ANTIGEN: Hepatitis B Surface Ag: NONREACTIVE

## 2019-05-21 LAB — PREPARE RBC (CROSSMATCH)

## 2019-05-21 LAB — CALCIUM, IONIZED: Calcium, Ionized, Serum: 3.6 mg/dL — ABNORMAL LOW (ref 4.5–5.6)

## 2019-05-21 MED ORDER — SENNA 8.6 MG PO TABS
1.0000 | ORAL_TABLET | Freq: Every day | ORAL | Status: DC
  Administered 2019-05-21 – 2019-05-22 (×2): 8.6 mg via ORAL
  Filled 2019-05-21 (×3): qty 1

## 2019-05-21 MED ORDER — CALCITRIOL 0.5 MCG PO CAPS
ORAL_CAPSULE | ORAL | Status: AC
  Administered 2019-05-21: 4 ug via ORAL
  Filled 2019-05-21: qty 8

## 2019-05-21 MED ORDER — SODIUM CHLORIDE 0.9% IV SOLUTION: Freq: Once | INTRAVENOUS | Status: AC

## 2019-05-21 NOTE — Procedures (Signed)
I was present at this dialysis session. I have reviewed the session itself and made appropriate changes.   Vital signs in last 24 hours:  Temp:  [97 F (36.1 C)-98.6 F (37 C)] 98.1 F (36.7 C) (12/19 0439) Pulse Rate:  [72-86] 76 (12/19 0600) Resp:  [14-32] 15 (12/19 0600) BP: (117-156)/(65-83) 155/76 (12/19 0600) SpO2:  [91 %-100 %] 99 % (12/19 0600) Weight:  [114.4 kg] 114.4 kg (12/18 0913) Weight change: 0 kg Filed Weights   05/18/19 0410 05/19/19 1624 05/20/19 0913  Weight: 113.6 kg 114.4 kg 114.4 kg    Recent Labs  Lab 05/21/19 0651  NA 135  K 4.6  CL 96*  CO2 25  GLUCOSE 166*  BUN 65*  CREATININE 6.23*  CALCIUM 6.2*  PHOS 5.8*    Recent Labs  Lab 05/20/19 1913 05/20/19 2311 05/21/19 0651  WBC 8.9 8.6 7.5  HGB 7.6* 7.4* 7.0*  HCT 23.1* 23.1* 21.9*  MCV 91.7 93.1 93.2  PLT 161 162 172    Scheduled Meds: . amitriptyline  100 mg Oral QHS  . bicalutamide  50 mg Oral Daily  . calcitRIOL  4 mcg Oral Once per day on Tue Thu Sat  . calcium carbonate  2 tablet Oral TID WC & HS  . Chlorhexidine Gluconate Cloth  6 each Topical Daily  . ferric citrate  420 mg Oral TID WC  . heparin injection (subcutaneous)  5,000 Units Subcutaneous Q8H  . insulin aspart  0-15 Units Subcutaneous TID WC  . insulin aspart  0-5 Units Subcutaneous QHS  . levothyroxine  75 mcg Oral Q0600  . metoprolol tartrate  25 mg Oral Daily  . sertraline  50 mg Oral Daily   Continuous Infusions: PRN Meds:.acetaminophen **OR** acetaminophen, oxyCODONE    Outpt HD:TTS [facility] 4h 6min 400/700 115kg 2K/3.5Ca bath Heparin none LFA AVF - vit D 4ug tiw - venofer 100 / hd  Assessment/Plan:  1. Left distal femur fracture s/p ORIF today by Dr. Marcelino Scot.  Tolerated surgery well and is without significant discomfort 2. ESRD- cont with TTS schedule and high Ca bath  3. Hypocalcemia- on po calcium, high dose vit D and added Ca bath with HD. 4. HTN- stable 5. DM per primary 6. ABLA on anemia  of ckd stage 5- post op drop in hgb.  Transfuse prn.  No ESA in setting of malignancy 7. Metastatic prostate cancer- per primary.  Donetta Potts,  MD 05/21/2019, 8:19 AM

## 2019-05-21 NOTE — Progress Notes (Signed)
PT Cancellation Note  Patient Details Name: Richard Hall MRN: [id number] DOB: 1956-04-09   Cancelled Treatment:    Reason Eval/Treat Not Completed: Medical issues which prohibited therapy  Patient attempted to see patient in the morning but he was at HD. In the afternoon therapy spoke with the patients nurse. The patient was receiving blood and had an elevated HR. Nursing requested therapy hold. Therapy will follow up later if time permits or on 14/43/6016.   Carney Living PT DPT  05/21/2019, 1:48 PM

## 2019-05-21 NOTE — Progress Notes (Signed)
Subjective: Pt seen at dialysis this morning. States he is feeling well after surgery yesterday. His pain is well controlled. Has not had a bowel movement yet. Denies chest pain or trouble breathing.  Objective:  Vital signs in last 24 hours: Vitals:   05/20/19 1945 05/20/19 2356 05/21/19 0000 05/21/19 0439  BP: 132/70  117/77 131/65  Pulse: 85  72 78  Resp: 18  14 18   Temp: 98 F (36.7 C) 98.6 F (37 C)  98.1 F (36.7 C)  TempSrc: Oral Oral  Oral  SpO2: 97%  100% 91%  Weight:      Height:       Physical Exam Vitals and nursing note reviewed.  Constitutional:      General: He is not in acute distress.    Appearance: He is not ill-appearing.  Cardiovascular:     Rate and Rhythm: Normal rate and regular rhythm.     Heart sounds: Normal heart sounds.  Pulmonary:     Effort: Pulmonary effort is normal.  Musculoskeletal:     Comments: Bandages in place over L lateral hip and thigh. No noted deformities or tenderness.  Skin:    General: Skin is warm and dry.     Comments: No bruising or surrounding erythema over L lateral hip and thigh  Neurological:     Mental Status: He is alert.    Assessment/Plan:  Principal Problem:   Fracture Active Problems:   Essential hypertension   ESRD on hemodialysis (HCC)   Atrial fibrillation (HCC)   Hypocalcemia   Prostate cancer metastatic to bone [facility])  Mr. Ginyard is a 63 year old M with significant PMH of ESRD on HD, a fib, HTN, metastatic prostate cancer, and CVA who presented for leg pain after a fall. He was found to have a distal comminuted L femur fracture.   Femur fracture Post-op day 1 from ORIF of distal femur fracture with intercondylar extension. Orthopedics following - non-weight bearing on LLE with unrestricted ROM of knee and hip - PT/OT eval and treat - outpatient f/u in 2 weeks for x-rays and suture removal  - post-op DVT ppx with heparin  - pain control with Oxy IR 5mg  PO q4h PRN - bowel regimen with senna PO  daily   Metastatic prostate cancer Pt with prostate cancer and associated bony metastasis and cancer related pain. He has not been seen by oncology since his diagnosis that has thus far been managed by urology. Was previously seen by [facility] early this year for follow-up of his chronic anemia, however the pt would like to follow-up with [facility] at this time. - outpatient follow-up with oncology at discharge  - continue bicalutamide   ESRD on HD Continue HD on TTS schedule. Labs stable this AM.  Anemia Due to acute blood loss anemia secondary to trauma and surgery on top of anemia of chronic disease. Transfused once thus far this admission. - Hgb 7.0 this morning - will transfuse an additional unit today - follow-up post-transfusion H&H  Hypocalcemia - uncertain etiology Corrected Ca this morning 7.6 - Ca bath added with HD today - continue calcitriol 25mcg daily and Tums 400mg  elemental Ca TID + qhs   Diabetes AM glc this morning 144. - moderate SSI and qhs - continue to monitor for improved wound healing after surgery  A fib HR's improved this morning ranging 70-80s - continue metoprolol 25mg  daily  HTN Stable at this time, ranging 110-150s/60-80s   Diet - renal/carb modified  Fluids - none DVT ppx - heparin 5,000 units subQ q8h CODE STATUS - FULL CODE  Dispo: Anticipated discharge in approximately 2-3 day(s).   Ladona Horns, MD 05/21/2019, 6:16 AM Pager: [phone]

## 2019-05-21 NOTE — Progress Notes (Signed)
ORTHOPAEDIC PROGRESS NOTE  s/p Procedure(s): OPEN REDUCTION INTERNAL FIXATION (ORIF) DISTAL FEMUR FRACTURE with intercondylar extension. Intramedullary (Im) Retrograde Femoral Nailing for impending pathologic fracture by Dr. Marcelino Scot on 05/20/2019  SUBJECTIVE: Patient is resting in hospital bed. He had just returned from dialysis. He reports mild pain about operative site. No chest pain. No SOB. No nausea/vomiting. No other complaints.  OBJECTIVE: PE: Left lower extremity: dressing CDI, tender to palpation about the hip and knee, leg lengths equal, intact EHL/TA/GSC, sensation intact distally, warm well perfused foot.   Vitals:   05/21/19 1229 05/21/19 1315  BP: 111/76 126/65  Pulse: (!) 130 (!) 123  Resp: 20 19  Temp:  98 F (36.7 C)  SpO2:  95%     ASSESSMENT: Richard Hall is a 63 y.o. male doing well postoperatively.  PLAN: Weightbearing: Non-weight bearing LLE, unrestricted ROM as tolerated of knee and hip Insicional and dressing care: PRN Orthopedic device(s): None VTE prophylaxis: per medicine Pain control: PRN Follow - up plan: 2 weeks in office with Dr. Marcelino Scot for removal of sutures and new x-rays   Noemi Chapel, PA-C 05/21/2019

## 2019-05-21 NOTE — Progress Notes (Signed)
CRITICAL VALUE ALERT  Critical Value:  Calcium 6.2  Date & Time Notied:  05/21/19 0802  Provider Notified: Internal Medicine Intern, Dr Gilford Rile  Orders Received/Actions taken:

## 2019-05-22 LAB — TYPE AND SCREEN
ABO/RH(D): A POS
Antibody Screen: NEGATIVE
Unit division: 0
Unit division: 0

## 2019-05-22 LAB — BPAM RBC
Blood Product Expiration Date: 202101152359
Blood Product Expiration Date: 202101162359
ISSUE DATE / TIME: 202012180807
ISSUE DATE / TIME: 202012191320
Unit Type and Rh: 6200
Unit Type and Rh: 6200

## 2019-05-22 LAB — CBC
HCT: 23.5 % — ABNORMAL LOW (ref 39.0–52.0)
Hemoglobin: 7.6 g/dL — ABNORMAL LOW (ref 13.0–17.0)
MCH: 29.8 pg (ref 26.0–34.0)
MCHC: 32.3 g/dL (ref 30.0–36.0)
MCV: 92.2 fL (ref 80.0–100.0)
Platelets: 193 10*3/uL (ref 150–400)
RBC: 2.55 MIL/uL — ABNORMAL LOW (ref 4.22–5.81)
RDW: 17 % — ABNORMAL HIGH (ref 11.5–15.5)
WBC: 7.5 10*3/uL (ref 4.0–10.5)
nRBC: 2 % — ABNORMAL HIGH (ref 0.0–0.2)

## 2019-05-22 LAB — RENAL FUNCTION PANEL
Albumin: 2.2 g/dL — ABNORMAL LOW (ref 3.5–5.0)
Anion gap: 12 (ref 5–15)
BUN: 49 mg/dL — ABNORMAL HIGH (ref 8–23)
CO2: 26 mmol/L (ref 22–32)
Calcium: 6.6 mg/dL — ABNORMAL LOW (ref 8.9–10.3)
Chloride: 91 mmol/L — ABNORMAL LOW (ref 98–111)
Creatinine, Ser: 5.11 mg/dL — ABNORMAL HIGH (ref 0.61–1.24)
GFR calc Af Amer: 13 mL/min — ABNORMAL LOW (ref >60)
GFR calc non Af Amer: 11 mL/min — ABNORMAL LOW (ref >60)
Glucose, Bld: 227 mg/dL — ABNORMAL HIGH (ref 70–99)
Phosphorus: 4.1 mg/dL (ref 2.5–4.6)
Potassium: 3.6 mmol/L (ref 3.5–5.1)
Sodium: 129 mmol/L — ABNORMAL LOW (ref 135–145)

## 2019-05-22 LAB — GLUCOSE, CAPILLARY
Glucose-Capillary: 211 mg/dL — ABNORMAL HIGH (ref 70–99)
Glucose-Capillary: 189 mg/dL — ABNORMAL HIGH (ref 70–99)

## 2019-05-22 MED ORDER — METOPROLOL TARTRATE 25 MG PO TABS
25.0000 mg | ORAL_TABLET | Freq: Two times a day (BID) | ORAL | Status: DC
  Administered 2019-05-22: 25 mg via ORAL
  Filled 2019-05-22 (×2): qty 1

## 2019-05-22 MED ORDER — APIXABAN 5 MG PO TABS
5.0000 mg | ORAL_TABLET | Freq: Two times a day (BID) | ORAL | Status: DC
  Administered 2019-05-22 – 2019-05-25 (×7): 5 mg via ORAL
  Filled 2019-05-22 (×7): qty 1

## 2019-05-22 NOTE — Progress Notes (Signed)
ORTHOPAEDIC PROGRESS NOTE  s/p Procedure(s): OPEN REDUCTION INTERNAL FIXATION (ORIF) DISTAL FEMUR FRACTURE with intercondylar extension. Intramedullary (Im) Retrograde Femoral Nailing for impending pathologic fracture by Dr. Marcelino Scot on 05/20/2019  SUBJECTIVE: Patient is resting in hospital bed. He reports mild pain about operative site. He has no complaints. No chest pain. No SOB. No nausea/vomiting. No other complaints.  OBJECTIVE: PE: General: pleasant male patient sitting up in hospital bed Left lower extremity: dressing CDI, tender to palpation about the hip and knee, leg lengths equal, intact EHL/TA/GSC, sensation intact distally, warm well perfused foot.   Vitals:   05/22/19 0729 05/22/19 1208  BP: 119/76 107/69  Pulse: (!) 102 93  Resp: (!) 22 18  Temp: 98.2 F (36.8 C) 98.7 F (37.1 C)  SpO2: 100% 95%     ASSESSMENT: Richard Hall is a 63 y.o. male doing well postoperatively.  PLAN: Weightbearing: Non-weight bearing LLE, unrestricted ROM as tolerated of knee and hip Insicional and dressing care: PRN Orthopedic device(s): None VTE prophylaxis: per medicine Pain control: PRN Follow - up plan: 2 weeks in office with Dr. Marcelino Scot for removal of sutures and new x-rays   Noemi Chapel, PA-C 05/22/2019

## 2019-05-22 NOTE — Evaluation (Signed)
Physical Therapy Evaluation Patient Details Name: Richard Hall MRN: [id number] DOB: 01-08-1956 Today's Date: 05/22/2019   History of Present Illness  Pt recently discharged from [facility] for weakness and dizziness with A-fib. Other PMH includes: Prostate cancer, OSA, ESRD on HD, DM, hypocalcemia, CVA, HTN, anemia. Pt returns to hospital after falling with cane and suffering a L distal femur fx. Underwent ORIF by Dr Marcelino Scot.   Clinical Impression  Pt admitted with above diagnosis. Pt having difficulty maintaining LLE NWB with mobility. Required mod A to get to EOB. Mod A +2 for sit<>stand but pt needed support under LLE to maintain NWB. Unable to transfer or ambulate with RW. Performed lateral scoot transfer into chair on R with mod A +2.  Pt currently with functional limitations due to the deficits listed below (see PT Problem List). Pt will benefit from skilled PT to increase their independence and safety with mobility to allow discharge to the venue listed below.       Follow Up Recommendations SNF;Supervision/Assistance - 24 hour    Equipment Recommendations  None recommended by PT    Recommendations for Other Services       Precautions / Restrictions Precautions Precautions: Fall Restrictions Weight Bearing Restrictions: Yes LLE Weight Bearing: Non weight bearing      Mobility  Bed Mobility Overal bed mobility: Needs Assistance Bed Mobility: Supine to Sit     Supine to sit: Mod assist     General bed mobility comments: mod A for mvmt of LLE and scooting of hips to EOB, pt able to manage upper body  Transfers Overall transfer level: Needs assistance Equipment used: Rolling walker (2 wheeled) Transfers: Sit to/from Stand;Lateral/Scoot Transfers Sit to Stand: Mod assist;+2 physical assistance;+2 safety/equipment        Lateral/Scoot Transfers: Mod assist;+2 physical assistance General transfer comment: mod A +2 for power up, mod A on one side for support and mod A at L LE  to monitor NWB. Pt has difficulty standing and maintaining LLE NWB. After sit<>stand pt sat back down and performed lateral scoot transfer into chair on R with drop arm down. Mod A +2 to scoot   Ambulation/Gait             General Gait Details: unable  Stairs            Wheelchair Mobility    Modified Rankin (Stroke Patients Only)       Balance Overall balance assessment: Needs assistance Sitting-balance support: No upper extremity supported;Feet supported Sitting balance-Leahy Scale: Fair     Standing balance support: Bilateral upper extremity supported;During functional activity Standing balance-Leahy Scale: Zero Standing balance comment: pt requires RW and external support to maintain standing                             Pertinent Vitals/Pain Pain Assessment: 0-10 Pain Score: 4  Pain Location: L lateral knee Pain Descriptors / Indicators: Aching Pain Intervention(s): Limited activity within patient's tolerance;Monitored during session    Home Living Family/patient expects to be discharged to:: Private residence Living Arrangements: Alone Available Help at Discharge: Available PRN/intermittently Type of Home: Apartment Home Access: Level entry     Home Layout: One level Home Equipment: Cane - single point;Walker - 2 wheels;Tub bench Additional Comments: uses SPC at baseline, reports he has walker if needed. Son lives in [place] and works from home    Prior Function Level of Independence: Independent with assistive device(s)  Hand Dominance   Dominant Hand: Right    Extremity/Trunk Assessment   Upper Extremity Assessment Upper Extremity Assessment: Defer to OT evaluation    Lower Extremity Assessment Lower Extremity Assessment: Generalized weakness;LLE deficits/detail LLE Deficits / Details: pt lacking full knee extension, hip flex 1/5, knee ext 2/5 LLE: Unable to fully assess due to pain LLE Sensation:  WNL LLE Coordination: WNL    Cervical / Trunk Assessment Cervical / Trunk Assessment: Normal  Communication   Communication: No difficulties  Cognition Arousal/Alertness: Awake/alert Behavior During Therapy: Flat affect Overall Cognitive Status: Within Functional Limits for tasks assessed                                        General Comments General comments (skin integrity, edema, etc.): HR low 110's up to high 130's. Pt in a fib.     Exercises General Exercises - Lower Extremity Ankle Circles/Pumps: AROM;Both;10 reps;Seated Quad Sets: AROM;Both;10 reps;Seated   Assessment/Plan    PT Assessment Patient needs continued PT services  PT Problem List Decreased strength;Decreased range of motion;Decreased activity tolerance;Decreased balance;Decreased mobility;Decreased knowledge of use of DME;Decreased knowledge of precautions;Decreased safety awareness;Pain;Obesity;Cardiopulmonary status limiting activity       PT Treatment Interventions DME instruction;Gait training;Functional mobility training;Therapeutic activities;Therapeutic exercise;Balance training;Neuromuscular re-education;Cognitive remediation;Patient/family education    PT Goals (Current goals can be found in the Care Plan section)  Acute Rehab PT Goals Patient Stated Goal: to go home, but agreeable to rehab first PT Goal Formulation: With patient Time For Goal Achievement: 06/05/19 Potential to Achieve Goals: Good    Frequency Min 3X/week   Barriers to discharge Decreased caregiver support      Co-evaluation PT/OT/SLP Co-Evaluation/Treatment: Yes Reason for Co-Treatment: Complexity of the patient's impairments (multi-system involvement);For patient/therapist safety PT goals addressed during session: Mobility/safety with mobility;Balance;Proper use of DME;Strengthening/ROM         AM-PAC PT "6 Clicks" Mobility  Outcome Measure Help needed turning from your back to your side while in a  flat bed without using bedrails?: A Lot Help needed moving from lying on your back to sitting on the side of a flat bed without using bedrails?: A Lot Help needed moving to and from a bed to a chair (including a wheelchair)?: A Lot Help needed standing up from a chair using your arms (e.g., wheelchair or bedside chair)?: A Lot Help needed to walk in hospital room?: Total Help needed climbing 3-5 steps with a railing? : Total 6 Click Score: 10    End of Session Equipment Utilized During Treatment: Gait belt Activity Tolerance: Patient limited by fatigue Patient left: in chair;with chair alarm set;with call bell/phone within reach Nurse Communication: Mobility status PT Visit Diagnosis: Unsteadiness on feet (R26.81);Repeated falls (R29.6);Muscle weakness (generalized) (M62.81);Difficulty in walking, not elsewhere classified (R26.2);Pain Pain - Right/Left: Left Pain - part of body: Knee    Time: 0174-9449 PT Time Calculation (min) (ACUTE ONLY): 22 min   Charges:   PT Evaluation $PT Eval Moderate Complexity: [place]  Pager [phone] Office [place] 05/22/2019, 10:19 AM

## 2019-05-22 NOTE — Progress Notes (Signed)
Subjective: Pt seen at the bedside this morning. Doing well. Denies fevers, post-op pain, chest pain, shortness of breath, dizziness, or palpitations. No acute concerns at this time. Pt's son to be at the bedside this afternoon.  Objective:  Vital signs in last 24 hours: Vitals:   05/21/19 1923 05/22/19 0004 05/22/19 0300 05/22/19 0500  BP: 122/85 123/70 121/71 117/80  Pulse: (!) 113 (!) 120  (!) 117  Resp: 20 20  18   Temp: 97.8 F (36.6 C) 98.4 F (36.9 C)  98.3 F (36.8 C)  TempSrc: Oral Oral  Oral  SpO2: 98% 98%  98%  Weight:      Height:       Physical Exam Vitals and nursing note reviewed.  Constitutional:      General: He is not in acute distress.    Appearance: He is not ill-appearing.  Cardiovascular:     Rate and Rhythm: Tachycardia present. Rhythm irregular.     Pulses:          Dorsalis pedis pulses are 2+ on the right side and 2+ on the left side.  Pulmonary:     Effort: Pulmonary effort is normal.  Musculoskeletal:     Right lower leg: No edema.     Left lower leg: No edema.  Skin:    General: Skin is warm and dry.  Neurological:     Mental Status: He is alert.    Assessment/Plan:  Principal Problem:   Fracture Active Problems:   Essential hypertension   ESRD on hemodialysis (HCC)   Atrial fibrillation (HCC)   Hypocalcemia   Prostate cancer metastatic to bone [facility])  Mr. Lauf is a 63 year old M with significant PMH of ESRD on HD, a fib, HTN, metastatic prostate cancer, and CVA who presented for leg pain after a fall. He was found to have a distal comminuted L femur fracture.   Femur fracture Post-op day 2 from ORIF of distal femur fracture with intercondylar extension and intramedullary nail fixation. - PT recommending SNF > social work consult placed - started apixaban 5mg  BID for pt's DVT ppx Orthopedics following - non-weight bearing on LLE with unrestricted ROM of knee and hip - outpatient f/u in 2 weeks for x-rays and suture removal  -  pain well controlled with Oxy IR 5mg  PO q4h PRN - bowel regimen with senna PO daily   Metastatic prostate cancer Pt with prostate cancer and associated bony metastasis and cancer related pain which has been managed by urology thus far. Was seen by [facility] early this year for follow-up of his chronic anemia, however the pt would like to follow-up with [facility] at this time. - outpatient follow-up with [facility] oncology at discharge  - continue bicalutamide  ESRD on HD Continue HD on TTS schedule.  Anemia Due to acute blood loss anemia secondary to trauma and surgery on top of anemia of chronic disease. Transfused once this admission. - pt does not appear to have received his additional transfusion yesterday - Hgb yesterday PM increased to 8.0, suspect concentration after dialysis session - Hgb 7.6 this morning, will continue to monitor  Hypocalcemia - uncertain etiology Corrected Ca 8.0 today - continue calcitriol 62mcg daily and Tums 400mg  elemental Ca TID + qhs  Diabetes AM glc 211. - moderate SSI and qhs - continue to monitor for improved wound healing after surgery  A fib HR's increased to 110-120s overnight. Pt may need increased dose of home metoprolol for better  rate control. - increase to metoprolol tartrate 25mg  BID - restarted anticoagulation today as above   Diet - renal/carb modified Fluids - none DVT ppx - apixaban 5mg  BID CODE STATUS - FULL CODE  Dispo: Anticipated discharge pending SNF placement  Ladona Horns, MD 05/22/2019, 6:40 AM Pager: [phone]

## 2019-05-22 NOTE — Evaluation (Signed)
Occupational Therapy Evaluation Patient Details Name: Richard Hall MRN: [id number] DOB: [birth year] Today's Date: 05/22/2019    History of Present Illness Pt recently discharged from [facility] for weakness and dizziness with A-fib. Other PMH includes: Prostate cancer, OSA, ESRD on HD, DM, hypocalcemia, CVA, HTN, anemia. Pt returns to hospital after falling with cane and suffering a L distal femur fx. Underwent ORIF by Dr Marcelino Scot.    Clinical Impression   This 63 yo male admitted with above presents to acute OT with increased pain, decreased balance, decreased mobility, decreased AROM LLE all affecting his safety and independence with basic ADLs as he was independent pta and now is Max a to total A for all LB ADLs and toileting. He will benefit from acute OT with follow at SNF.    Follow Up Recommendations  SNF;Supervision/Assistance - 24 hour    Equipment Recommendations  None recommended by OT       Precautions / Restrictions Precautions Precautions: Fall Restrictions Weight Bearing Restrictions: Yes LLE Weight Bearing: Non weight bearing      Mobility Bed Mobility Overal bed mobility: Needs Assistance Bed Mobility: Supine to Sit     Supine to sit: Mod assist     General bed mobility comments: mod A for mvmt of LLE and scooting of hips to EOB, pt able to manage upper body  Transfers Overall transfer level: Needs assistance Equipment used: Rolling walker (2 wheeled) Transfers: Sit to/from Stand;Lateral/Scoot Transfers Sit to Stand: Mod assist;+2 physical assistance;+2 safety/equipment        Lateral/Scoot Transfers: Mod assist;+2 physical assistance General transfer comment: mod A +2 for power up, mod A on one side for support and mod A at L LE to monitor NWB. Pt has difficulty standing and maintaining LLE NWB. After sit<>stand pt sat back down and performed lateral scoot transfer into chair on R with drop arm down. Mod A +2 to scoot     Balance Overall balance assessment:  Needs assistance Sitting-balance support: No upper extremity supported;Feet supported Sitting balance-Leahy Scale: Fair     Standing balance support: Bilateral upper extremity supported;During functional activity Standing balance-Leahy Scale: Zero Standing balance comment: pt requires RW and external support to maintain standing                           ADL either performed or assessed with clinical judgement   ADL Overall ADL's : Needs assistance/impaired Eating/Feeding: Independent;Sitting   Grooming: Set up;Supervision/safety;Sitting   Upper Body Bathing: Set up;Supervision/ safety;Sitting   Lower Body Bathing: Maximal assistance Lower Body Bathing Details (indicate cue type and reason): Mod A +2 sit<>stand from raised bed Upper Body Dressing : Set up;Supervision/safety;Sitting   Lower Body Dressing: Total assistance Lower Body Dressing Details (indicate cue type and reason): Mod A +2 sit<>stand from raised bed Toilet Transfer: Moderate assistance;+2 for physical assistance Toilet Transfer Details (indicate cue type and reason): lateral scoot from bed to drop arm recliner (one to help with scoot and one to support leg so he did not bear weight through it) Toileting- Clothing Manipulation and Hygiene: Total assistance               Vision Patient Visual Report: No change from baseline              Pertinent Vitals/Pain Pain Assessment: 0-10 Pain Score: 4  Pain Location: L lateral knee Pain Descriptors / Indicators: Aching Pain Intervention(s): Limited activity within patient's tolerance;Monitored during session  Hand Dominance Right   Extremity/Trunk Assessment Upper Extremity Assessment Upper Extremity Assessment: Generalized weakness   Lower Extremity Assessment Lower Extremity Assessment: Generalized weakness;LLE deficits/detail LLE Deficits / Details: pt lacking full knee extension, hip flex 1/5, knee ext 2/5 LLE: Unable to fully assess  due to pain LLE Sensation: WNL LLE Coordination: WNL   Cervical / Trunk Assessment Cervical / Trunk Assessment: Normal   Communication Communication Communication: No difficulties   Cognition Arousal/Alertness: Awake/alert Behavior During Therapy: Flat affect Overall Cognitive Status: Within Functional Limits for tasks assessed                                     General Comments  HR low 110's up to high 130's. Pt in a fib.     Exercises Exercises: General Lower Extremity General Exercises - Lower Extremity Ankle Circles/Pumps: AROM;Both;10 reps;Seated Quad Sets: AROM;Both;10 reps;Seated        Home Living Family/patient expects to be discharged to:: Private residence Living Arrangements: Alone Available Help at Discharge: Available PRN/intermittently Type of Home: Apartment Home Access: Level entry     Home Layout: One level     Bathroom Shower/Tub: Tub/shower unit;Curtain   Biochemist, clinical: Standard     Home Equipment: Cane - single point;Walker - 2 wheels;Tub bench   Additional Comments: uses SPC at baseline, reports he has walker if needed. Son lives in [place] and works from home      Prior Functioning/Environment Level of Independence: Independent with assistive device(s)                 OT Problem List: Decreased strength;Decreased range of motion;Impaired balance (sitting and/or standing);Pain      OT Treatment/Interventions: Self-care/ADL training;DME and/or AE instruction;Patient/family education;Balance training;Therapeutic activities;Therapeutic exercise    OT Goals(Current goals can be found in the care plan section) Acute Rehab OT Goals Patient Stated Goal: to go home, but agreeable to rehab first OT Goal Formulation: With patient Time For Goal Achievement: 06/05/19 Potential to Achieve Goals: Good  OT Frequency: Min 2X/week   Barriers to D/C: Decreased caregiver support          Co-evaluation PT/OT/SLP  Co-Evaluation/Treatment: Yes Reason for Co-Treatment: Complexity of the patient's impairments (multi-system involvement);For patient/therapist safety PT goals addressed during session: Mobility/safety with mobility;Balance;Proper use of DME;Strengthening/ROM OT goals addressed during session: Strengthening/ROM;ADL's and self-care      AM-PAC OT "6 Clicks" Daily Activity     Outcome Measure Help from another person eating meals?: None Help from another person taking care of personal grooming?: A Little Help from another person toileting, which includes using toliet, bedpan, or urinal?: Total Help from another person bathing (including washing, rinsing, drying)?: A Lot Help from another person to put on and taking off regular upper body clothing?: A Little Help from another person to put on and taking off regular lower body clothing?: Total 6 Click Score: 14   End of Session Equipment Utilized During Treatment: Gait belt  Activity Tolerance: Patient tolerated treatment well Patient left: in chair;with call bell/phone within reach  OT Visit Diagnosis: Other abnormalities of gait and mobility (R26.89);Muscle weakness (generalized) (M62.81);Pain Pain - Right/Left: Left Pain - part of body: Leg                Time: 4540-9811 OT Time Calculation (min): 30 min Charges:  OT General Charges $OT Visit: 1 Visit OT Evaluation $OT Eval Moderate Complexity: 1  Marijo Conception , OTR/L Acute Rehab Services Pager [phone] Office [phone]     05/22/2019, 1:20 PM

## 2019-05-22 NOTE — Progress Notes (Signed)
Patient ID: Richard Hall, male   DOB: 1955/07/06, 63 y.o.   MRN: [id number] S: Complaining of some discomfort in his left leg since getting out of bed and into a chair O:BP 107/69 (BP Location: Right Arm)   Pulse 93   Temp 98.7 F (37.1 C) (Oral)   Resp 18   Ht 6' (1.829 m)   Wt 112.5 kg   SpO2 95%   BMI 33.64 kg/m   Intake/Output Summary (Last 24 hours) at 05/22/2019 1209 Last data filed at 05/22/2019 0500 Gross per 24 hour  Intake --  Output 450 ml  Net -450 ml   Intake/Output: I/O last 3 completed shifts: In: -  Out: 2850 [Urine:850; Other:2000]  Intake/Output this shift:  No intake/output data recorded. Weight change: 0 kg Gen: NAD CVS: no rub Resp: cta Abd: +BS< soft, nt/nd Ext: no edema, LAVF +T/B  Recent Labs  Lab 05/15/19 1627 05/17/19 2025 05/18/19 0244 05/19/19 0614 05/19/19 0855 05/20/19 0307 05/21/19 0651 05/22/19 0649  NA 133* 135 134* 133* 133* 134* 135 129*  K 4.0 4.5 4.7 4.3 4.5 4.1 4.6 3.6  CL 95* 99 98 94* 96* 96* 96* 91*  CO2 24 21* 23 24 22 25 25 26   GLUCOSE 295* 259* 239* 299* 200* 215* 166* 227*  BUN 36* 39* 44* 68* 68* 40* 65* 49*  CREATININE 3.38* 4.37* 4.56* 6.96* 7.11* 4.86* 6.23* 5.11*  ALBUMIN 2.6*  --  2.7*  --   --   --  2.3* 2.2*  CALCIUM 6.6* 7.6* 7.2* 6.4* 6.2* 6.8* 6.2* 6.6*  PHOS  --   --  5.1*  --   --   --  5.8* 4.1  AST 21  --   --   --   --   --   --   --   ALT 16  --   --   --   --   --   --   --    Liver Function Tests: Recent Labs  Lab 05/15/19 1627 05/18/19 0244 05/21/19 0651 05/22/19 0649  AST 21  --   --   --   ALT 16  --   --   --   ALKPHOS 1,274*  --   --   --   BILITOT 0.8  --   --   --   PROT 7.1  --   --   --   ALBUMIN 2.6* 2.7* 2.3* 2.2*   No results for input(s): LIPASE, AMYLASE in the last 168 hours. No results for input(s): AMMONIA in the last 168 hours. CBC: Recent Labs  Lab 05/20/19 1913 05/20/19 2311 05/21/19 0651 05/21/19 2104 05/22/19 0649  WBC 8.9 8.6 7.5 7.5 7.5  HGB 7.6* 7.4*  7.0* 8.4* 7.6*  HCT 23.1* 23.1* 21.9* 25.9* 23.5*  MCV 91.7 93.1 93.2 92.2 92.2  PLT 161 162 172 186 193   Cardiac Enzymes: No results for input(s): CKTOTAL, CKMB, CKMBINDEX, TROPONINI in the last 168 hours. CBG: Recent Labs  Lab 05/21/19 1203 05/21/19 1627 05/21/19 2108 05/22/19 0729 05/22/19 1204  GLUCAP 115* 290* 267* 211* 189*    Iron Studies: No results for input(s): IRON, TIBC, TRANSFERRIN, FERRITIN in the last 72 hours. Studies/Results: DG C-Arm 1-60 Min  Result Date: 05/20/2019 CLINICAL DATA:  Open reduction and internal fixation of distal left femur fracture. EXAM: LEFT FEMUR 2 VIEWS; DG C-ARM 1-60 MIN COMPARISON:  Radiographs dated 05/17/2019 FINDINGS: AP and lateral C-arm images demonstrate an intramedullary nail and lag screw  in the proximal femur and side plate and multiple screws across the comminuted fracture of the distal femur. The hardware appears in excellent position. Alignment and position of the multiple fracture fragments is markedly improved and near anatomic. IMPRESSION: Open reduction and internal fixation of comminuted distal femur fracture. FLUOROSCOPY TIME:  3 minutes 5 seconds C-arm fluoroscopic images were obtained intraoperatively and submitted for post operative interpretation. Electronically Signed   By: Lorriane Shire M.D.   On: 05/20/2019 14:58   DG FEMUR MIN 2 VIEWS LEFT  Result Date: 05/20/2019 CLINICAL DATA:  Open reduction and internal fixation of distal left femur fracture. EXAM: LEFT FEMUR 2 VIEWS; DG C-ARM 1-60 MIN COMPARISON:  Radiographs dated 05/17/2019 FINDINGS: AP and lateral C-arm images demonstrate an intramedullary nail and lag screw in the proximal femur and side plate and multiple screws across the comminuted fracture of the distal femur. The hardware appears in excellent position. Alignment and position of the multiple fracture fragments is markedly improved and near anatomic. IMPRESSION: Open reduction and internal fixation of  comminuted distal femur fracture. FLUOROSCOPY TIME:  3 minutes 5 seconds C-arm fluoroscopic images were obtained intraoperatively and submitted for post operative interpretation. Electronically Signed   By: Lorriane Shire M.D.   On: 05/20/2019 14:58   DG FEMUR PORT MIN 2 VIEWS LEFT  Result Date: 05/20/2019 CLINICAL DATA:  64 year old male with metastatic prostate cancer. Impending proximal femur fracture and distal femur fracture with intercondylar extension. Status post left femur ORIF postoperative day zero EXAM: LEFT FEMUR PORTABLE 2 VIEWS COMPARISON:  CT left knee 05/17/2019. Intraoperative radiographs earlier today. FINDINGS: Portable AP and cross-table lateral views. Proximal femur intramedullary rod within interlocking proximal dynamic hip screw, and interlocking distal cortical screw in conjunction with lateral mid and distal femur plate and multiple screws terminating at the femoral condyles. Hardware appears intact. Displaced butterfly fragments of the medial distal femoral metadiaphysis appear stable. Grossly preserved alignment at the left hip and knee. Postoperative soft tissue gas in the left thigh. Superimposed calcified peripheral vascular disease. IMPRESSION: Left femur ORIF with no adverse features identified. Electronically Signed   By: Genevie Ann M.D.   On: 05/20/2019 16:49   . amitriptyline  100 mg Oral QHS  . apixaban  5 mg Oral BID  . bicalutamide  50 mg Oral Daily  . calcitRIOL  4 mcg Oral Once per day on Tue Thu Sat  . calcium carbonate  2 tablet Oral TID WC & HS  . Chlorhexidine Gluconate Cloth  6 each Topical Daily  . ferric citrate  420 mg Oral TID WC  . insulin aspart  0-15 Units Subcutaneous TID WC  . insulin aspart  0-5 Units Subcutaneous QHS  . levothyroxine  75 mcg Oral Q0600  . metoprolol tartrate  25 mg Oral BID  . senna  1 tablet Oral Daily  . sertraline  50 mg Oral Daily    BMET    Component Value Date/Time   NA 129 (L) 05/22/2019 0649   K 3.6 05/22/2019  0649   CL 91 (L) 05/22/2019 0649   CO2 26 05/22/2019 0649   GLUCOSE 227 (H) 05/22/2019 0649   BUN 49 (H) 05/22/2019 0649   CREATININE 5.11 (H) 05/22/2019 0649   CALCIUM 6.6 (L) 05/22/2019 0649   GFRNONAA 11 (L) 05/22/2019 0649   GFRAA 13 (L) 05/22/2019 0649   CBC    Component Value Date/Time   WBC 7.5 05/22/2019 0649   RBC 2.55 (L) 05/22/2019 0649   HGB 7.6 (  L) 05/22/2019 0649   HCT 23.5 (L) 05/22/2019 0649   PLT 193 05/22/2019 0649   MCV 92.2 05/22/2019 0649   MCH 29.8 05/22/2019 0649   MCHC 32.3 05/22/2019 0649   RDW 17.0 (H) 05/22/2019 0649   LYMPHSABS 1.6 01/12/2019 0424   MONOABS 1.1 (H) 01/12/2019 0424   EOSABS 0.2 01/12/2019 0424   BASOSABS 0.0 01/12/2019 0424    Outpt HD:TTS [place] 4h 42min 400/700 115kg 2K/3.5Ca bath Heparin none LFA AVF - vit D 4ug tiw - venofer 100 / hd  Assessment/Plan:  1. Left distal femur fracture s/p ORIF today by Dr. Marcelino Scot. Tolerated surgery well and is without significant discomfort but will likely require SNF placement  2. ESRD- cont with TTS schedule and high Ca bath  3. Hypocalcemia- on po calcium, high dose vit D and added Ca bath with HD. 4. HTN- stable 5. DM per primary 6. ABLA on anemia of ckd stage 5- post op drop in hgb. Transfuse prn. No ESA in setting of malignancy 7. Metastatic prostate cancer- per primary. 8. Disposition- will need SNF placement until he can manage ADLs  Donetta Potts, MD [facility] [phone]

## 2019-05-22 NOTE — TOC Initial Note (Signed)
Transition of Care [facility]) - Initial/Assessment Note    Patient Details  Name: Richard Hall MRN: [id number] Date of Birth: July 17, 1955  Transition of Care [facility]) CM/SW Contact:    Gelene Mink, [place] Phone Number: 05/22/2019, 2:31 PM  Clinical Narrative:                  CSW met with the patient at bedside. CSW introduced herself, explained her role, and shared therapy recommendation. Patient is agreeable to SNF. CSW explained SNF process and answered his questions. Patient reports he lives home alone and needs some extra help. Patient did not have a preference of SNF, CSW obtained permission to fax the patient out and provide bed offers. CSW provided CMS SNF List to patient.   CSW will continue to follow and assist with disposition planning.   Expected Discharge Plan: Skilled Nursing Facility Barriers to Discharge: Continued Medical Work up   Patient Goals and CMS Choice Patient states their goals for this hospitalization and ongoing recovery are:: Pt wants to go to SNF CMS Medicare.gov Compare Post Acute Care list provided to:: Patient Choice offered to / list presented to : Patient  Expected Discharge Plan and Services Expected Discharge Plan: [place] In-house Referral: Clinical Social Work Discharge Planning Services: NA Post Acute Care Choice: [place] Living arrangements for the past 2 months: Apartment                 DME Arranged: N/A DME Agency: NA         [place] Agency: NA        Prior Living Arrangements/Services Living arrangements for the past 2 months: Apartment Lives with:: Self Patient language and need for interpreter reviewed:: No Do you feel safe going back to the place where you live?: Yes      Need for Family Participation in Patient Care: No (Comment) Care giver support system in place?: Yes (comment)   Criminal Activity/Legal Involvement Pertinent to Current Situation/Hospitalization: No - Comment as  needed  Activities of Daily Living Home Assistive Devices/Equipment: None, CPAP, Bedside commode/3-in-1, Shower chair with back ADL Screening (condition at time of admission) Patient's cognitive ability adequate to safely complete daily activities?: No Is the patient deaf or have difficulty hearing?: No Does the patient have difficulty seeing, even when wearing glasses/contacts?: No Does the patient have difficulty concentrating, remembering, or making decisions?: No Patient able to express need for assistance with ADLs?: Yes Does the patient have difficulty dressing or bathing?: No Independently performs ADLs?: Yes (appropriate for developmental age) Does the patient have difficulty walking or climbing stairs?: No Weakness of Legs: Left Weakness of Arms/Hands: None  Permission Sought/Granted Permission sought to share information with : Case Manager Permission granted to share information with : Yes, Verbal Permission Granted  Share Information with NAME: [name]  Permission granted to share info w AGENCY: All SNF  Permission granted to share info w Relationship: Son     Emotional Assessment Appearance:: Appears stated age Attitude/Demeanor/Rapport: Engaged Affect (typically observed): Calm Orientation: : Oriented to Self, Oriented to Place, Oriented to  Time, Oriented to Situation Alcohol / Substance Use: Not Applicable Psych Involvement: No (comment)  Admission diagnosis:  Fracture [T14.8XXA] Fall [W19.XXXA] End stage renal disease on dialysis [facility]) [N18.6, Z99.2] Fall, initial encounter [W19.XXXA] 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 Prostate cancer metastatic to bone ([place]) 05/18/2019  . Fracture  05/17/2019  . Hypocalcemia 05/14/2019  . Bilateral leg edema 01/28/2019  . Symptomatic anemia 01/05/2019  . Gross hematuria 01/05/2019  . Gastrointestinal  hemorrhage with melena 01/05/2019  . OSA on CPAP   . Hypothyroidism (acquired)   . Essential hypertension   . Dyslipidemia   . Diabetes mellitus with end-stage renal disease ([place])   . Acute blood loss anemia   . ESRD on hemodialysis ([place]) 08/13/2018  . ESRF (end stage renal failure) ([place]) 06/07/2018  . Chronic diastolic congestive heart failure ([place]) 02/09/2018  . Anemia, chronic renal failure, stage 4 (severe) ([place]) 11/09/2017  . Chronic respiratory failure with hypoxia and hypercapnia ([place]) 11/09/2017  . Iron deficiency anemia 10/28/2017  . Atrial fibrillation ([place]) 10/07/2017  . Morbid obesity with BMI of 40.0-44.9, adult ([place]) 07/14/2016  . Diabetic peripheral neuropathy associated with type 2 diabetes mellitus ([place]) 10/10/2015  . Allergy 04/30/2015  . Bronchitis 04/30/2015  . ED (erectile dysfunction) of organic origin 04/30/2015  . Lipoma of back 04/30/2015  . Microalbuminuria 04/30/2015  . Osteoarthritis 04/30/2015  . Microalbuminuria due to type 2 diabetes mellitus ([place]) 01/09/2014  . Chronic kidney disease (CKD), stage III (moderate) 07/22/2013  . Acute ill-defined cerebrovascular disease 04/04/2013  . Dizziness 04/04/2013  . Hypercholesterolemia 04/04/2013  . Hypoxemia 04/04/2013  . Insomnia 04/04/2013  . Periodic limb movement disorder 04/04/2013  . Diplopia 06/07/2012  . Personal history of transient ischemic attack (TIA), and cerebral infarction without residual deficits 05/19/2011   PCP:  [name], MD Pharmacy:   [facility] [address] [place] [address] Phone: [phone] Fax: [phone]     Social Determinants of Health (SDOH) Interventions    Readmission Risk Interventions Readmission Risk Prevention Plan 01/12/2019  Transportation Screening Complete  PCP or Specialist Appt within 3-5 Days Complete  HRI or [place] Complete  Social Work Consult for [place]  Planning/Counseling Complete  Palliative Care Screening Not Applicable  Medication Review [employer] photographer) Complete

## 2019-05-22 NOTE — Discharge Instructions (Addendum)
Information on my medicine - ELIQUIS (apixaban)  Why was Eliquis prescribed for you? Eliquis was prescribed for you to reduce the risk of a blood clot forming that can cause a stroke if you have a medical condition called atrial fibrillation (a type of irregular heartbeat).  What do You need to know about Eliquis ? Take your Eliquis TWICE DAILY - one tablet in the morning and one tablet in the evening with or without food. If you have difficulty swallowing the tablet whole please discuss with your pharmacist how to take the medication safely.  Take Eliquis exactly as prescribed by your doctor and DO NOT stop taking Eliquis without talking to the doctor who prescribed the medication.  Stopping may increase your risk of developing a stroke.  Refill your prescription before you run out.  After discharge, you should have regular check-up appointments with your healthcare provider that is prescribing your Eliquis.  In the future your dose may need to be changed if your kidney function or weight changes by a significant amount or as you get older.  What do you do if you miss a dose? If you miss a dose, take it as soon as you remember on the same day and resume taking twice daily.  Do not take more than one dose of ELIQUIS at the same time to make up a missed dose.  Important Safety Information A possible side effect of Eliquis is bleeding. You should call your healthcare provider right away if you experience any of the following: ? Bleeding from an injury or your nose that does not stop. ? Unusual colored urine (red or dark brown) or unusual colored stools (red or black). ? Unusual bruising for unknown reasons. ? A serious fall or if you hit your head (even if there is no bleeding).  Some medicines may interact with Eliquis and might increase your risk of bleeding or clotting while on Eliquis. To help avoid this, consult your healthcare provider or pharmacist prior to using any new  prescription or non-prescription medications, including herbals, vitamins, non-steroidal anti-inflammatory drugs (NSAIDs) and supplements.  This website has more information on Eliquis (apixaban): http://[email or web address].com/eliquis/home    Orthopaedic Trauma Service Discharge Instructions   General Discharge Instructions  Orthopaedic Injuries:  Left distal femur fracture treated with open reduction and internal fixation with plate and screws             Impending Left hip fracture treated with intramedullary nailing   WEIGHT BEARING STATUS: Nonweighbearing Left Lower Extremity   RANGE OF MOTION/ACTIVITY: unrestricted Range of motion left hip and knee.  Do not let knee rest in flexion. Do not place pillows under back of knee when at rest. Place pillows under ankle to elevate leg and to help keep knee straight. Activity as tolerated while maintaining weightbearing restrictions   Bone health: continue per renal team   Wound Care: daily wound care starting immediately. Ok to leave surgical wounds open to air if there is no drainage.  Wound care instructions below. You can use a TED hose or similar compression sock instead of ace wrap  Discharge Wound Care Instructions  Do NOT apply any ointments, solutions or lotions to pin sites or surgical wounds.  These prevent needed drainage and even though solutions like hydrogen peroxide kill bacteria, they also damage cells lining the pin sites that help fight infection.  Applying lotions or ointments can keep the wounds moist and can cause them to breakdown and open up as well. This can  increase the risk for infection. When in doubt call the office.  Surgical incisions should be dressed daily.  If any drainage is noted, use one layer of adaptic, then gauze, Kerlix, and an ace wrap.  You can also use 4x4 gauze and tape instead   Once the incision is completely dry and without drainage, it may be left open to air out.  Showering may begin 36-48 hours  later.  Cleaning gently with soap and water.    Diet: as you were eating previously.  Can use over the counter stool softeners and bowel preparations, such as Miralax, to help with bowel movements.  Narcotics can be constipating.  Be sure to drink plenty of fluids  PAIN MEDICATION USE AND EXPECTATIONS  You have likely been given narcotic medications to help control your pain.  After a traumatic event that results in an fracture (broken bone) with or without surgery, it is ok to use narcotic pain medications to help control one's pain.  We understand that everyone responds to pain differently and each individual patient will be evaluated on a regular basis for the continued need for narcotic medications. Ideally, narcotic medication use should last no more than 6-8 weeks (coinciding with fracture healing).   As a patient it is your responsibility as well to monitor narcotic medication use and report the amount and frequency you use these medications when you come to your office visit.   We would also advise that if you are using narcotic medications, you should take a dose prior to therapy to maximize you participation.  IF YOU ARE ON NARCOTIC MEDICATIONS IT IS NOT PERMISSIBLE TO OPERATE A MOTOR VEHICLE (MOTORCYCLE/CAR/TRUCK/MOPED) OR HEAVY MACHINERY DO NOT MIX NARCOTICS WITH OTHER CNS (CENTRAL NERVOUS SYSTEM) DEPRESSANTS SUCH AS ALCOHOL   STOP SMOKING OR USING NICOTINE PRODUCTS!!!!  As discussed nicotine severely impairs your body's ability to heal surgical and traumatic wounds but also impairs bone healing.  Wounds and bone heal by forming microscopic blood vessels (angiogenesis) and nicotine is a vasoconstrictor (essentially, shrinks blood vessels).  Therefore, if vasoconstriction occurs to these microscopic blood vessels they essentially disappear and are unable to deliver necessary nutrients to the healing tissue.  This is one modifiable factor that you can do to dramatically increase your  chances of healing your injury.    (This means no smoking, no nicotine gum, patches, etc)  DO NOT USE NONSTEROIDAL ANTI-INFLAMMATORY DRUGS (NSAID'S)  Using products such as Advil (ibuprofen), Aleve (naproxen), Motrin (ibuprofen) for additional pain control during fracture healing can delay and/or prevent the healing response.  If you would like to take over the counter (OTC) medication, Tylenol (acetaminophen) is ok.  However, some narcotic medications that are given for pain control contain acetaminophen as well. Therefore, you should not exceed more than 4000 mg of tylenol in a day if you do not have liver disease.  Also note that there are may OTC medicines, such as cold medicines and allergy medicines that my contain tylenol as well.  If you have any questions about medications and/or interactions please ask your doctor/PA or your pharmacist.      ICE AND ELEVATE INJURED/OPERATIVE EXTREMITY  Using ice and elevating the injured extremity above your heart can help with swelling and pain control.  Icing in a pulsatile fashion, such as 20 minutes on and 20 minutes off, can be followed.    Do not place ice directly on skin. Make sure there is a barrier between to skin and the ice pack.  Using frozen items such as frozen peas works well as the conform nicely to the are that needs to be iced.  USE AN ACE WRAP OR TED HOSE FOR SWELLING CONTROL  In addition to icing and elevation, Ace wraps or TED hose are used to help limit and resolve swelling.  It is recommended to use Ace wraps or TED hose until you are informed to stop.    When using Ace Wraps start the wrapping distally (farthest away from the body) and wrap proximally (closer to the body)   Example: If you had surgery on your leg or thing and you do not have a splint on, start the ace wrap at the toes and work your way up to the thigh        If you had surgery on your upper extremity and do not have a splint on, start the ace wrap at your fingers  and work your way up to the upper arm  IF YOU ARE IN A SPLINT OR CAST DO NOT [place]   If your splint gets wet for any reason please contact the office immediately. You may shower in your splint or cast as long as you keep it dry.  This can be done by wrapping in a cast cover or garbage back (or similar)  Do Not stick any thing down your splint or cast such as pencils, money, or hangers to try and scratch yourself with.  If you feel itchy take benadryl as prescribed on the bottle for itching  IF YOU ARE IN A CAM BOOT (BLACK BOOT)  You may remove boot periodically. Perform daily dressing changes as noted below.  Wash the liner of the boot regularly and wear a sock when wearing the boot. It is recommended that you sleep in the boot until told otherwise    Call office for the following:  Temperature greater than 101F  Persistent nausea and vomiting  Severe uncontrolled pain  Redness, tenderness, or signs of infection (pain, swelling, redness, odor or green/yellow discharge around the site)  Difficulty breathing, headache or visual disturbances  Hives  Persistent dizziness or light-headedness  Extreme fatigue  Any other questions or concerns you may have after discharge  In an emergency, call 911 or go to an Emergency Department at a nearby hospital    [place]: [phone]   VISIT OUR WEBSITE FOR ADDITIONAL INFORMATION: [email or web address]

## 2019-05-22 NOTE — NC FL2 (Signed)
[place] LEVEL OF CARE SCREENING TOOL     IDENTIFICATION  Patient Name: Richard Hall Birthdate: July 11, 1955 Sex: male Admission Date (Current Location): 05/17/2019  [place] and [place] Number:  Kathleen Argue [id number] [place] and Address:  [facility]. [facility] [place] [address] [place], [place] [address]      Provider Number: [id number]  Attending Physician Name and Address:  Oda Kilts, MD  Relative Name and Phone Number:  Sabastien, Tyler, [phone]    Current Level of Care: Hospital Recommended Level of Care: [place] Prior Approval Number:    Date Approved/Denied:   PASRR Number: [id number]  Discharge Plan: SNF    Current Diagnoses: Patient Active Problem List   Diagnosis Date Noted  . Prostate cancer metastatic to bone ([place]) 05/18/2019  . Fracture 05/17/2019  . Hypocalcemia 05/14/2019  . Bilateral leg edema 01/28/2019  . Symptomatic anemia 01/05/2019  . Gross hematuria 01/05/2019  . Gastrointestinal hemorrhage with melena 01/05/2019  . OSA on CPAP   . Hypothyroidism (acquired)   . Essential hypertension   . Dyslipidemia   . Diabetes mellitus with end-stage renal disease ([place])   . Acute blood loss anemia   . ESRD on hemodialysis ([place]) 08/13/2018  . ESRF (end stage renal failure) ([place]) 06/07/2018  . Chronic diastolic congestive heart failure ([place]) 02/09/2018  . Anemia, chronic renal failure, stage 4 (severe) ([place]) 11/09/2017  . Chronic respiratory failure with hypoxia and hypercapnia ([place]) 11/09/2017  . Iron deficiency anemia 10/28/2017  . Atrial fibrillation ([place]) 10/07/2017  . Morbid obesity with BMI of 40.0-44.9, adult ([place]) 07/14/2016  . Diabetic peripheral neuropathy associated with type 2 diabetes mellitus ([place]) 10/10/2015  . Allergy 04/30/2015  . Bronchitis 04/30/2015  . ED (erectile dysfunction) of organic origin 04/30/2015  . Lipoma of back 04/30/2015  . Microalbuminuria 04/30/2015  .  Osteoarthritis 04/30/2015  . Microalbuminuria due to type 2 diabetes mellitus ([place]) 01/09/2014  . Chronic kidney disease (CKD), stage III (moderate) 07/22/2013  . Acute ill-defined cerebrovascular disease 04/04/2013  . Dizziness 04/04/2013  . Hypercholesterolemia 04/04/2013  . Hypoxemia 04/04/2013  . Insomnia 04/04/2013  . Periodic limb movement disorder 04/04/2013  . Diplopia 06/07/2012  . Personal history of transient ischemic attack (TIA), and cerebral infarction without residual deficits 05/19/2011    Orientation RESPIRATION BLADDER Height & Weight     Self, Time, Situation, Place  O2(100, [place], 3L) Continent, External catheter Weight: 248 lb 0.3 oz (112.5 kg) Height:  6' (182.9 cm)  BEHAVIORAL SYMPTOMS/MOOD NEUROLOGICAL BOWEL NUTRITION STATUS      Continent Diet(renal/carb modified diet, thin liquids, fluid restriction 184ml, no snacks)  AMBULATORY STATUS COMMUNICATION OF NEEDS Skin   Limited Assist Verbally Skin abrasions, Bruising, Surgical wounds(abrasion on right arm, bruising on right/left arm, closed incision on left leg (dressing in place))                       Personal Care Assistance Level of Assistance  Bathing, Feeding, Dressing Bathing Assistance: Limited assistance Feeding assistance: Independent Dressing Assistance: Limited assistance     Functional Limitations Info  Sight, Hearing, Speech Sight Info: Adequate Hearing Info: Adequate Speech Info: Adequate    SPECIAL CARE FACTORS FREQUENCY  PT (By licensed PT), OT (By licensed OT)     PT Frequency: 5x/wk OT Frequency: 5x/wk            Contractures Contractures Info: Not present    Additional Factors Info  Code Status, Allergies, Insulin Sliding  Scale Code Status Info: Full code Allergies Info: Gabapentin   Insulin Sliding Scale Info: insulin aspart novolog 0-15 units 3x daily w/meals and insulin aspart novolog 0-5 units at bedtime       Current Medications (05/22/2019):  This is the  current hospital active medication list Current Facility-Administered Medications  Medication Dose Route Frequency Provider Last Rate Last Admin  . acetaminophen (TYLENOL) tablet 650 mg  650 mg Oral Q6H PRN Ainsley Spinner, PA-C       Or  . acetaminophen (TYLENOL) suppository 650 mg  650 mg Rectal Q6H PRN Ainsley Spinner, PA-C      . amitriptyline (ELAVIL) tablet 100 mg  100 mg Oral QHS Ainsley Spinner, PA-C   100 mg at 05/21/19 2148  . apixaban (ELIQUIS) tablet 5 mg  5 mg Oral BID Ladona Horns, MD   5 mg at 05/22/19 0919  . bicalutamide (CASODEX) tablet 50 mg  50 mg Oral Daily Ainsley Spinner, PA-C   50 mg at 05/22/19 2979  . calcitRIOL (ROCALTROL) capsule 4 mcg  4 mcg Oral Once per day on Tue Thu Sat Ainsley Spinner, PA-C   4 mcg at 05/21/19 1108  . calcium carbonate (TUMS - dosed in mg elemental calcium) chewable tablet 400 mg of elemental calcium  2 tablet Oral TID WC & HS Ainsley Spinner, PA-C   400 mg of elemental calcium at 05/22/19 1305  . Chlorhexidine Gluconate Cloth 2 % PADS 6 each  6 each Topical Daily Ainsley Spinner, PA-C   6 each at 05/22/19 0920  . ferric citrate (AURYXIA) tablet 420 mg  420 mg Oral TID WC Ainsley Spinner, PA-C   420 mg at 05/22/19 1305  . insulin aspart (novoLOG) injection 0-15 Units  0-15 Units Subcutaneous TID WC Seawell, Jaimie A, DO   3 Units at 05/22/19 1304  . insulin aspart (novoLOG) injection 0-5 Units  0-5 Units Subcutaneous QHS Seawell, Jaimie A, DO   3 Units at 05/21/19 2150  . levothyroxine (SYNTHROID) tablet 75 mcg  75 mcg Oral Q0600 Ainsley Spinner, PA-C   75 mcg at 05/22/19 8921  . metoprolol tartrate (LOPRESSOR) tablet 25 mg  25 mg Oral BID Ladona Horns, MD      . oxyCODONE (Oxy IR/ROXICODONE) immediate release tablet 5 mg  5 mg Oral Q4H PRN Seawell, Jaimie A, DO   5 mg at 05/22/19 1036  . senna (SENOKOT) tablet 8.6 mg  1 tablet Oral Daily Seawell, Jaimie A, DO   8.6 mg at 05/22/19 0919  . sertraline (ZOLOFT) tablet 50 mg  50 mg Oral Daily Ainsley Spinner, PA-C   50 mg at 05/22/19 1941      Discharge Medications: Please see discharge summary for a list of discharge medications.  Relevant Imaging Results:  Relevant Lab Results:   Additional Information SSN: [id number]; COVID negative 05/18/2019  [place], LCSWA

## 2019-05-23 ENCOUNTER — Encounter: Payer: Self-pay | Admitting: *Deleted

## 2019-05-23 LAB — CBC
HCT: 21.8 % — ABNORMAL LOW (ref 39.0–52.0)
HCT: 23.1 % — ABNORMAL LOW (ref 39.0–52.0)
Hemoglobin: 7.1 g/dL — ABNORMAL LOW (ref 13.0–17.0)
Hemoglobin: 7.8 g/dL — ABNORMAL LOW (ref 13.0–17.0)
MCH: 29.5 pg (ref 26.0–34.0)
MCH: 29.9 pg (ref 26.0–34.0)
MCHC: 32.6 g/dL (ref 30.0–36.0)
MCHC: 33.8 g/dL (ref 30.0–36.0)
MCV: 90.5 fL (ref 80.0–100.0)
MCV: 88.5 fL (ref 80.0–100.0)
Platelets: 177 10*3/uL (ref 150–400)
Platelets: 174 10*3/uL (ref 150–400)
RBC: 2.41 MIL/uL — ABNORMAL LOW (ref 4.22–5.81)
RBC: 2.61 MIL/uL — ABNORMAL LOW (ref 4.22–5.81)
RDW: 16.8 % — ABNORMAL HIGH (ref 11.5–15.5)
RDW: 17.8 % — ABNORMAL HIGH (ref 11.5–15.5)
WBC: 6.9 10*3/uL (ref 4.0–10.5)
WBC: 7.1 10*3/uL (ref 4.0–10.5)
nRBC: 2.2 % — ABNORMAL HIGH (ref 0.0–0.2)
nRBC: 3.2 % — ABNORMAL HIGH (ref 0.0–0.2)

## 2019-05-23 LAB — GLUCOSE, CAPILLARY
Glucose-Capillary: 238 mg/dL — ABNORMAL HIGH (ref 70–99)
Glucose-Capillary: 246 mg/dL — ABNORMAL HIGH (ref 70–99)
Glucose-Capillary: 188 mg/dL — ABNORMAL HIGH (ref 70–99)
Glucose-Capillary: 201 mg/dL — ABNORMAL HIGH (ref 70–99)
Glucose-Capillary: 175 mg/dL — ABNORMAL HIGH (ref 70–99)
Glucose-Capillary: 237 mg/dL — ABNORMAL HIGH (ref 70–99)

## 2019-05-23 LAB — RENAL FUNCTION PANEL
Albumin: 2.2 g/dL — ABNORMAL LOW (ref 3.5–5.0)
Anion gap: 15 (ref 5–15)
BUN: 64 mg/dL — ABNORMAL HIGH (ref 8–23)
CO2: 24 mmol/L (ref 22–32)
Calcium: 6.7 mg/dL — ABNORMAL LOW (ref 8.9–10.3)
Chloride: 92 mmol/L — ABNORMAL LOW (ref 98–111)
Creatinine, Ser: 6.11 mg/dL — ABNORMAL HIGH (ref 0.61–1.24)
GFR calc Af Amer: 10 mL/min — ABNORMAL LOW (ref >60)
GFR calc non Af Amer: 9 mL/min — ABNORMAL LOW (ref >60)
Glucose, Bld: 184 mg/dL — ABNORMAL HIGH (ref 70–99)
Phosphorus: 4.3 mg/dL (ref 2.5–4.6)
Potassium: 4 mmol/L (ref 3.5–5.1)
Sodium: 131 mmol/L — ABNORMAL LOW (ref 135–145)

## 2019-05-23 LAB — PREPARE RBC (CROSSMATCH)

## 2019-05-23 MED ORDER — INSULIN ASPART 100 UNIT/ML ~~LOC~~ SOLN
3.0000 [IU] | Freq: Three times a day (TID) | SUBCUTANEOUS | Status: DC
  Administered 2019-05-23 – 2019-05-25 (×5): 3 [IU] via SUBCUTANEOUS

## 2019-05-23 MED ORDER — CHLORHEXIDINE GLUCONATE CLOTH 2 % EX PADS
6.0000 | MEDICATED_PAD | Freq: Every day | CUTANEOUS | Status: DC
  Administered 2019-05-23 – 2019-05-24 (×3): 6 via TOPICAL

## 2019-05-23 MED ORDER — POLYETHYLENE GLYCOL 3350 17 G PO PACK
17.0000 g | PACK | Freq: Every day | ORAL | Status: DC
  Administered 2019-05-23 – 2019-05-25 (×3): 17 g via ORAL
  Filled 2019-05-23 (×3): qty 1

## 2019-05-23 MED ORDER — SENNA 8.6 MG PO TABS
2.0000 | ORAL_TABLET | Freq: Two times a day (BID) | ORAL | Status: DC
  Administered 2019-05-23 – 2019-05-25 (×5): 17.2 mg via ORAL
  Filled 2019-05-23 (×4): qty 2

## 2019-05-23 MED ORDER — METOPROLOL TARTRATE 50 MG PO TABS
50.0000 mg | ORAL_TABLET | Freq: Two times a day (BID) | ORAL | Status: DC
  Administered 2019-05-23 – 2019-05-25 (×5): 50 mg via ORAL
  Filled 2019-05-23 (×5): qty 1

## 2019-05-23 MED ORDER — SODIUM CHLORIDE 0.9% IV SOLUTION: Freq: Once | INTRAVENOUS | Status: DC

## 2019-05-23 NOTE — Progress Notes (Signed)
Received call from MD [facility] that stat blood was needed. Spoke with phlebotomy to inform about stat blood draw.

## 2019-05-23 NOTE — Progress Notes (Addendum)
Orthopaedic Trauma Service Progress Note  Patient ID: Richard Hall MRN: [id number] DOB/AGE: [birth year] 63 y.o.  Subjective:  Doing well this am Denies pain in Left leg  Reviewed therapy notes      Slow to mobilize       +2 assist with transfers      Did not ambulate yet  Pt would like to go to SNF in [place] if at all possible  Denies lightheadedness, dizziness  No SOB or CP   Mild tachycardia this am   cbg's slightly better   ROS As above  Objective:   VITALS:   Vitals:   05/22/19 2350 05/23/19 0351 05/23/19 0629 05/23/19 0721  BP: 114/62 113/67 117/81 (!) 129/59  Pulse: 92 (!) 110  (!) 101  Resp: 18 20 19 17   Temp: 98.4 F (36.9 C) 98.4 F (36.9 C)  98.3 F (36.8 C)  TempSrc: Oral Oral  Oral  SpO2: 97% 92%  93%  Weight:      Height:        Estimated body mass index is 33.64 kg/m as calculated from the following:   Height as of this encounter: 6' (1.829 m).   Weight as of this encounter: 112.5 kg.   Intake/Output      12/20 0701 - 12/21 0700 12/21 0701 - 12/22 0700   P.O. 600    Total Intake(mL/kg) 600 (5.3)    Urine (mL/kg/hr) 1350 (0.5)    Other     Total Output 1350    Net -750           LABS  Results for orders placed or performed during the hospital encounter of 05/17/19 (from the past 24 hour(s))  Glucose, capillary     Status: Abnormal   Collection Time: 05/22/19 12:04 PM  Result Value Ref Range   Glucose-Capillary 189 (H) 70 - 99 mg/dL  Renal function panel     Status: Abnormal   Collection Time: 05/23/19  3:34 AM  Result Value Ref Range   Sodium 131 (L) 135 - 145 mmol/L   Potassium 4.0 3.5 - 5.1 mmol/L   Chloride 92 (L) 98 - 111 mmol/L   CO2 24 22 - 32 mmol/L   Glucose, Bld 184 (H) 70 - 99 mg/dL   BUN 64 (H) 8 - 23 mg/dL   Creatinine, Ser 6.11 (H) 0.61 - 1.24 mg/dL   Calcium 6.7 (L) 8.9 - 10.3 mg/dL   Phosphorus 4.3 2.5 - 4.6 mg/dL   Albumin 2.2 (L) 3.5  - 5.0 g/dL   GFR calc non Af Amer 9 (L) >60 mL/min   GFR calc Af Amer 10 (L) >60 mL/min   Anion gap 15 5 - 15  CBC     Status: Abnormal   Collection Time: 05/23/19  3:34 AM  Result Value Ref Range   WBC 6.9 4.0 - 10.5 K/uL   RBC 2.41 (L) 4.22 - 5.81 MIL/uL   Hemoglobin 7.1 (L) 13.0 - 17.0 g/dL   HCT 21.8 (L) 39.0 - 52.0 %   MCV 90.5 80.0 - 100.0 fL   MCH 29.5 26.0 - 34.0 pg   MCHC 32.6 30.0 - 36.0 g/dL   RDW 16.8 (H) 11.5 - 15.5 %   Platelets 177 150 - 400 K/uL   nRBC 2.2 (H) 0.0 - 0.2 %  Glucose, capillary     Status: Abnormal   Collection Time: 05/23/19  7:20 AM  Result Value Ref Range   Glucose-Capillary 188 (H) 70 - 99 mg/dL    CBG (last 3)  Recent Labs    05/22/19 0729 05/22/19 1204 05/23/19 0720  GLUCAP 211* 189* 188*    PHYSICAL EXAM:   Gen: resting comfortably in bed, NAD, pleasant, chronically ill appearing  Lungs: unlabored Cardiac: slightly tachy  Ext:       Left Lower Extremity   Dressings c/d/i  Ext warm  + DP pulse  Swelling well controlled  DPN, SPN, TN sensation intact  EHL, FHL, lesser toe motor intact  Ankle flexion, extension, inversion and eversion intact  No pain with passive stretching   Knee does not come into full extension which is baseline for him. Nor does his contralateral knee come into full extension    Assessment/Plan: 3 Days Post-Op   Principal Problem:   Fracture Active Problems:   Essential hypertension   ESRD on hemodialysis ([place])   Atrial fibrillation (HCC)   Hypocalcemia   Prostate cancer metastatic to bone ([place])   Anti-infectives (From admission, onward)   Start     Dose/Rate Route Frequency Ordered Stop   05/20/19 0909  ceFAZolin (ANCEF) 2-4 GM/100ML-% IVPB    Note to Pharmacy: Jasmine Pang   : cabinet override      05/20/19 0909 05/20/19 2114   05/19/19 0900  ceFAZolin (ANCEF) IVPB 2g/100 mL premix  Status:  Discontinued     2 g 200 mL/hr over 30 Minutes Intravenous On call to O.R. 05/18/19 1329 05/19/19  0033   05/19/19 0800  ceFAZolin (ANCEF) IVPB 2g/100 mL premix  Status:  Discontinued     2 g 200 mL/hr over 30 Minutes Intravenous To [facility] 05/19/19 0026 05/19/19 1047    .  POD/HD#: 69  63 y/o male with h/o ESRD on dialysis, DM, metastatic prostate cancer with acute Left distal femur fracture and impending fracture of L proximal femur   -acute left distal femur fracture, likely pathologic fracture from bone mets.  Impending L proximal femur fracture due to bone mets.  S/p ORIF left distal femur and antegrade nailing Left hip    NWB L Lower Extremity x 6 weeks  Unrestricted ROM L knee and hip   Do not let knee rest in flexion so as to prevent development of further flexion contracture  Continue PT/OT  Dressing change tomorrow   Ok to shower and clean surgical sites with soap and water only   Continue with ice for swelling and pain     ? If it would be good to consult with radiation oncology at this point given bony mets from prostate cancer   - Pain management:  Continue with current regimen  - ABL anemia/Hemodynamics  H/H drifting down again this am   Will transfuse 1 u prbcs today   - Medical issues   Per primary   - DVT/PE prophylaxis:  eliquis  - ID:   periop abx completed  - Metabolic Bone Disease:  Related to ESRD and metastatic prostate cancer (not sure if he has been treated with androgen deprivation therapy)   Continue management per renal team   - Activity:  Up with therapies  NWB L leg   - FEN/GI prophylaxis/Foley/Lines:  Carb mod diet w/ fluid restriction   - Impediments to fracture healing:  DM  ESRD  Metastatic prostate cancer   Thyroid dysfunction   Chronic  lasix use    - Dispo:  Ortho issues addressed   Continue with therapies  SNF when bed available  Follow up with ortho in 10 days from [place], PA-C [phone] (C) 05/23/2019, 8:37 AM  [facility] [place]  [id number] [phone] Jenetta [phone] (F)   After 6pm on weekdays please call office number to get in touch with on call provider or refer to [place] and look to see who is on call for the Sports Medicine Call Group which is listed under orthopaedics   On Weekends please call office number to get in touch with on call provider or refer to [place] and look to see who is on call for the Sports Medicine Call Group which is listed under orthopaedics

## 2019-05-23 NOTE — Care Management Important Message (Signed)
Important Message  Patient Details  Name: Richard Hall MRN: [id number] Date of Birth: 1955-11-07   Medicare Important Message Given:  Yes     Orbie Pyo 05/23/2019, 2:18 PM

## 2019-05-23 NOTE — Progress Notes (Signed)
Subjective: Pt seen at the bedside this morning. States he is feeling alright with his post-operative pain well controlled. Is understanding of SNF referral and agreeable to go. No acute concerns at this time.  Objective:  Vital signs in last 24 hours: Vitals:   05/22/19 2043 05/22/19 2230 05/22/19 2350 05/23/19 0351  BP: (!) 138/93  114/62 113/67  Pulse: (!) 112 (!) 134 92 (!) 110  Resp: 18 19 18 20   Temp: 98.4 F (36.9 C)  98.4 F (36.9 C) 98.4 F (36.9 C)  TempSrc: Oral  Oral Oral  SpO2: 96% 92% 97% 92%  Weight:      Height:       Physical Exam Vitals and nursing note reviewed.  Constitutional:      General: He is not in acute distress.    Appearance: He is not ill-appearing.  Cardiovascular:     Rate and Rhythm: Tachycardia present. Rhythm irregular.     Heart sounds: Normal heart sounds.  Pulmonary:     Effort: Pulmonary effort is normal. No respiratory distress.  Musculoskeletal:     Right lower leg: No edema.     Left lower leg: No edema.     Comments: Dressing over L lateral leg clean, dry, and intact.  Skin:    General: Skin is warm and dry.  Neurological:     Mental Status: He is alert.    Assessment/Plan:  Principal Problem:   Fracture Active Problems:   Essential hypertension   ESRD on hemodialysis (HCC)   Atrial fibrillation (HCC)   Hypocalcemia   Prostate cancer metastatic to bone [facility])  Mr. Rajewski is a 63 year old M with significant PMH of ESRD on HD, a fib, HTN, metastatic prostate cancer, and CVA who presented for leg pain after a fall. He was found to have a distal comminuted L femur fracture.   Femur fracture Post-op day 3 from ORIF of distal femur fracture with intercondylar extension and intramedullary nail fixation. - social work placed SNF referrals yesterday - DVT ppx with pt's home apixaban 5mg  BID - pain control with Oxy IR 5mg  PO q4h PRN - add miralax to current bowel regimen with senna  - continue PT/OT  Orthopedics  following - non-weight bearing on LLE for 6 weeks - unrestricted ROM of knee and hip - do not rest knee in flexion so as to prevent development of further flexion contracture - dressing change tomorrow, clean surgical sites with soap and water only - ice for pain and swelling - follow-up with orthopedic 10 days after discharge for x-rays and suture removal   Metastatic prostate cancer Pt with prostate cancer and associated bony metastasis and cancer related pain which has been managed by urology thus far.  - discussed pt with Dr. Julien Nordmann who will coordinate for outpatient follow-up  - continue bicalutamide  ESRD on HD Continue HD on TTS schedule.  Anemia Due to acute blood loss anemia secondary to trauma and surgery on top of anemia of chronic disease. Transfused once this admission. - Hgb decreased to 7.1 - transfuse 1 unit this afternoon - continue to monitor  Hypocalcemia - uncertain etiology Corrected Ca 8.1 today - continue calcitriol 31mcg TTS and Tums 400mg  elemental Ca TID + qhs  Diabetes AM glc 188. - moderate SSI and qhs  A fib HR continues to be mildly tachycardic from 90-110s.  - increase to metoprolol tartrate 50mg  BID - restarted anticoagulation as above   Diet - renal/carb modified Fluids - none  DVT ppx- apixaban 5mg  BID CODE STATUS- FULL CODE  Dispo: Anticipated discharge pending acceptance to SNF  Ladona Horns, MD 05/23/2019, 6:16 AM Pager: [phone]

## 2019-05-23 NOTE — Progress Notes (Signed)
Inpatient Diabetes Program Recommendations  AACE/ADA: New Consensus Statement on Inpatient Glycemic Control (2015)  Target Ranges:  Prepandial:   less than 140 mg/dL      Peak postprandial:   less than 180 mg/dL (1-2 hours)      Critically ill patients:  140 - 180 mg/dL   Results for AUDON, HEYMANN (MRN [id number]) as of 05/23/2019 13:42  Ref. Range 05/23/2019 07:20 05/23/2019 11:39  Glucose-Capillary Latest Ref Range: 70 - 99 mg/dL 188 (H)  3 units NOVOLOG  201 (H)  5 units NOVOLOG      Home DM Meds: Glipizide 5 mg Daily  Current Orders: Novolog Moderate Correction Scale/ SSI (0-15 units) TID AC + HS     MD- Please consider starting Novolog Meal Coverage:   Novolog 3 units TID with meals  (Please add the following Hold Parameters: Hold if pt eats <50% of meal, Hold if pt NPO)      --Will follow patient during hospitalization--  Wyn Quaker RN, MSN, CDE Diabetes Coordinator Inpatient Glycemic Control Team Team Pager: [phone] (8a-5p)

## 2019-05-23 NOTE — Progress Notes (Signed)
[facility] ROUNDING NOTE   Subjective:   This is a 63 year old gentleman who has a past medical history end-stage renal disease hemodialysis dependent on it TTS schedule.  Atrial fibrillation hypertension prostate cancer and CVA who presented with a distal femur fracture with intercondyle extension that required intramedullary nail placement and open reduction internal fixation.  Interestingly he has a history of prostate cancer associated with bony metastases and cancer pain related to this.  He is managed by urology and has been seen by [facility] hematology oncology.  Blood pressure 128/59 pulse 66 temperature 98.3 O2 sats 95% room air  Last dialysis 05/21/2019 with removal of 2 L  Sodium 131 potassium 4.0 chloride 92 CO2 24 BUN 64 creatinine 6.11 glucose 184 calcium 6.7 albumin 2.2 phosphorus 4.3  WBC 6.9 hemoglobin 7.1 platelets 177  Amitriptyline 100 mg nightly, Eliquis 5 mg twice daily, Casodex 50 mg daily, calcitriol 4 mcg Tuesday Thursday Saturday with dialysis, Tums 2 tablets 4 times daily with meals, Auryxia 420 mg 3 times daily with meals, insulin sliding scale, Synthroid 75 mcg daily, Lopressor 50 mg twice daily, Zoloft 50 mg daily,   Objective:  Vital signs in last 24 hours:  Temp:  [98.3 F (36.8 C)-98.9 F (37.2 C)] 98.3 F (36.8 C) (12/21 0721) Pulse Rate:  [92-134] 101 (12/21 0721) Resp:  [17-20] 17 (12/21 0721) BP: (107-138)/(59-93) 129/59 (12/21 0721) SpO2:  [92 %-97 %] 93 % (12/21 0721)  Weight change:  Filed Weights   05/20/19 0913 05/21/19 0750 05/21/19 1122  Weight: 114.4 kg 114.4 kg 112.5 kg    Intake/Output: I/O last 3 completed shifts: In: 600 [P.O.:600] Out: 1800 [Urine:1800]   Intake/Output this shift:  Total I/O In: 260 [P.O.:260] Out: -   Gen: NAD CVS: no rub Resp: cta Abd: +BS< soft, nt/nd Ext: no edema, LAVF +T/B   Basic Metabolic Panel: Recent Labs  Lab 05/18/19 0244 05/19/19 0855 05/20/19 0307 05/21/19 0651  05/22/19 0649 05/23/19 0334  NA 134* 133* 134* 135 129* 131*  K 4.7 4.5 4.1 4.6 3.6 4.0  CL 98 96* 96* 96* 91* 92*  CO2 23 22 25 25 26 24   GLUCOSE 239* 200* 215* 166* 227* 184*  BUN 44* 68* 40* 65* 49* 64*  CREATININE 4.56* 7.11* 4.86* 6.23* 5.11* 6.11*  CALCIUM 7.2* 6.2* 6.8* 6.2* 6.6* 6.7*  MG  --  2.1  --   --   --   --   PHOS 5.1*  --   --  5.8* 4.1 4.3    Liver Function Tests: Recent Labs  Lab 05/18/19 0244 05/21/19 0651 05/22/19 0649 05/23/19 0334  ALBUMIN 2.7* 2.3* 2.2* 2.2*   No results for input(s): LIPASE, AMYLASE in the last 168 hours. No results for input(s): AMMONIA in the last 168 hours.  CBC: Recent Labs  Lab 05/20/19 2311 05/21/19 0651 05/21/19 2104 05/22/19 0649 05/23/19 0334  WBC 8.6 7.5 7.5 7.5 6.9  HGB 7.4* 7.0* 8.4* 7.6* 7.1*  HCT 23.1* 21.9* 25.9* 23.5* 21.8*  MCV 93.1 93.2 92.2 92.2 90.5  PLT 162 172 186 193 177    Cardiac Enzymes: No results for input(s): CKTOTAL, CKMB, CKMBINDEX, TROPONINI in the last 168 hours.  BNP: Invalid input(s): POCBNP  CBG: Recent Labs  Lab 05/21/19 1627 05/21/19 2108 05/22/19 0729 05/22/19 1204 05/23/19 0720  GLUCAP 290* 267* 211* 189* 188*    Microbiology: Results for orders placed or performed during the hospital encounter of 05/17/19  SARS CORONAVIRUS 2 (TAT 6-24 HRS) Nasopharyngeal  Nasopharyngeal Swab     Status: None   Collection Time: 05/18/19  2:44 AM   Specimen: Nasopharyngeal Swab  Result Value Ref Range Status   SARS Coronavirus 2 NEGATIVE NEGATIVE Final    Comment: (NOTE) SARS-CoV-2 target nucleic acids are NOT DETECTED. The SARS-CoV-2 RNA is generally detectable in upper and lower respiratory specimens during the acute phase of infection. Negative results do not preclude SARS-CoV-2 infection, do not rule out co-infections with other pathogens, and should not be used as the sole basis for treatment or other patient management decisions. Negative results must be combined with clinical  observations, patient history, and epidemiological information. The expected result is Negative. Fact Sheet for Patients: SugarRoll.be Fact Sheet for Healthcare Providers: https://www.woods-mathews.com/ This test is not yet approved or cleared by the [place] FDA and  has been authorized for detection and/or diagnosis of SARS-CoV-2 by FDA under an Emergency Use Authorization (EUA). This EUA will remain  in effect (meaning this test can be used) for the duration of the COVID-19 declaration under Section 56 4(b)(1) of the Act, 21 U.S.C. section 360bbb-3(b)(1), unless the authorization is terminated or revoked sooner. Performed at [facility], [place] [address] [place], [place] [address]   MRSA PCR Screening     Status: None   Collection Time: 05/18/19  4:12 AM   Specimen: Nasopharyngeal  Result Value Ref Range Status   MRSA by PCR NEGATIVE NEGATIVE Final    Comment:        The GeneXpert MRSA Assay (FDA approved for NASAL specimens only), is one component of a comprehensive MRSA colonization surveillance program. It is not intended to diagnose MRSA infection nor to guide or monitor treatment for MRSA infections. Performed at [facility], [place] [address], [place],  [address]     Coagulation Studies: No results for input(s): LABPROT, INR in the last 72 hours.  Urinalysis: No results for input(s): COLORURINE, LABSPEC, PHURINE, GLUCOSEU, HGBUR, BILIRUBINUR, KETONESUR, PROTEINUR, UROBILINOGEN, NITRITE, LEUKOCYTESUR in the last 72 hours.  Invalid input(s): APPERANCEUR    Imaging: No results found.   Medications:    . sodium chloride   Intravenous Once  . amitriptyline  100 mg Oral QHS  . apixaban  5 mg Oral BID  . bicalutamide  50 mg Oral Daily  . calcitRIOL  4 mcg Oral Once per day on Tue Thu Sat  . calcium carbonate  2 tablet Oral TID WC & HS  . Chlorhexidine Gluconate Cloth  6 each Topical Daily  .  ferric citrate  420 mg Oral TID WC  . insulin aspart  0-15 Units Subcutaneous TID WC  . insulin aspart  0-5 Units Subcutaneous QHS  . levothyroxine  75 mcg Oral Q0600  . metoprolol tartrate  50 mg Oral BID  . polyethylene glycol  17 g Oral Daily  . senna  2 tablet Oral BID  . sertraline  50 mg Oral Daily   acetaminophen **OR** acetaminophen, oxyCODONE  Assessment/ Plan:  1. Left distal femur fracture s/p ORIF today by Dr. Marcelino Scot. Tolerated surgery well and is without significant discomfort but will likely require SNF placement  2. ESRD- cont with TTS schedule and high Ca bath tolerated ultrafiltration of 2 L on 05/21/2019 next dialysis plan will be 05/24/2019. 3. Hypocalcemia- on po calcium, high dose vit D and added Ca bath with HD.  Corrected calcium now appears above 8. 4. HTN- stable 5. DM per primary 6. ABLA on anemia of ckd stage 5- post  op drop in hgb. Transfuse prn. No ESA in setting of malignancy 7. Metastatic prostate cancer- per primary. 8. Disposition- will need SNF placement until he can manage ADLs   LOS: [place] @TODAY @10 :18 AM

## 2019-05-24 LAB — TYPE AND SCREEN
ABO/RH(D): A POS
Antibody Screen: NEGATIVE
Unit division: 0

## 2019-05-24 LAB — CBC
HCT: 25.3 % — ABNORMAL LOW (ref 39.0–52.0)
Hemoglobin: 8.1 g/dL — ABNORMAL LOW (ref 13.0–17.0)
MCH: 28.8 pg (ref 26.0–34.0)
MCHC: 32 g/dL (ref 30.0–36.0)
MCV: 90 fL (ref 80.0–100.0)
Platelets: 198 10*3/uL (ref 150–400)
RBC: 2.81 MIL/uL — ABNORMAL LOW (ref 4.22–5.81)
RDW: 17.9 % — ABNORMAL HIGH (ref 11.5–15.5)
WBC: 6.8 10*3/uL (ref 4.0–10.5)
nRBC: 2.9 % — ABNORMAL HIGH (ref 0.0–0.2)

## 2019-05-24 LAB — SARS CORONAVIRUS 2 (TAT 6-24 HRS): SARS Coronavirus 2: NEGATIVE

## 2019-05-24 LAB — RENAL FUNCTION PANEL
Albumin: 2.1 g/dL — ABNORMAL LOW (ref 3.5–5.0)
Anion gap: 14 (ref 5–15)
BUN: 77 mg/dL — ABNORMAL HIGH (ref 8–23)
CO2: 28 mmol/L (ref 22–32)
Calcium: 7 mg/dL — ABNORMAL LOW (ref 8.9–10.3)
Chloride: 90 mmol/L — ABNORMAL LOW (ref 98–111)
Creatinine, Ser: 7.08 mg/dL — ABNORMAL HIGH (ref 0.61–1.24)
GFR calc Af Amer: 9 mL/min — ABNORMAL LOW (ref >60)
GFR calc non Af Amer: 7 mL/min — ABNORMAL LOW (ref >60)
Glucose, Bld: 199 mg/dL — ABNORMAL HIGH (ref 70–99)
Phosphorus: 4.4 mg/dL (ref 2.5–4.6)
Potassium: 3.9 mmol/L (ref 3.5–5.1)
Sodium: 132 mmol/L — ABNORMAL LOW (ref 135–145)

## 2019-05-24 LAB — BPAM RBC
Blood Product Expiration Date: 202101162359
ISSUE DATE / TIME: 202012211201
Unit Type and Rh: 6200

## 2019-05-24 LAB — GLUCOSE, CAPILLARY
Glucose-Capillary: 181 mg/dL — ABNORMAL HIGH (ref 70–99)
Glucose-Capillary: 257 mg/dL — ABNORMAL HIGH (ref 70–99)
Glucose-Capillary: 182 mg/dL — ABNORMAL HIGH (ref 70–99)

## 2019-05-24 MED ORDER — SODIUM CHLORIDE 0.9 % IV SOLN: 100.0000 mL | INTRAVENOUS | Status: DC | PRN

## 2019-05-24 MED ORDER — LIDOCAINE-PRILOCAINE 2.5-2.5 % EX CREA: 1.0000 "application " | TOPICAL_CREAM | CUTANEOUS | Status: DC | PRN

## 2019-05-24 MED ORDER — LIDOCAINE HCL (PF) 1 % IJ SOLN: 5.0000 mL | INTRAMUSCULAR | Status: DC | PRN

## 2019-05-24 MED ORDER — HEPARIN SODIUM (PORCINE) 1000 UNIT/ML DIALYSIS: 1000.0000 [IU] | INTRAMUSCULAR | Status: DC | PRN

## 2019-05-24 MED ORDER — PENTAFLUOROPROP-TETRAFLUOROETH EX AERO: 1.0000 "application " | INHALATION_SPRAY | CUTANEOUS | Status: DC | PRN

## 2019-05-24 MED ORDER — ALTEPLASE 2 MG IJ SOLR: 2.0000 mg | Freq: Once | INTRAMUSCULAR | Status: DC | PRN

## 2019-05-24 MED ORDER — CALCITRIOL 0.5 MCG PO CAPS
ORAL_CAPSULE | ORAL | Status: AC
  Filled 2019-05-24: qty 8

## 2019-05-24 NOTE — Discharge Planning (Signed)
Patient has seat at [facility]. MWF. [facility] is closed for Christmas. Patient will be on MWSaturday schedule this week.

## 2019-05-24 NOTE — Procedures (Signed)
I have seen and examined this patient and agree with the plan of care.  Patient is being seen during his dialysis treatment 05/24/2019 appears to be tolerating well with stable blood pressures.  He is being accessed through a left AV fistula with no issues    Sherril Croon 05/24/2019, 10:37 AM

## 2019-05-24 NOTE — Progress Notes (Signed)
   Subjective: Pt seen at dialysis today. States he is doing well, endorses good pain control. Wasn't seen by PT yesterday but is willing to continue working with them to help advance his recovery. No acute concerns at this time.  Objective:  Vital signs in last 24 hours: Vitals:   05/23/19 1900 05/23/19 2000 05/23/19 2100 05/24/19 0300  BP: (!) 164/67 119/65 120/71 (!) 143/75  Pulse: 93 84 93 99  Resp: 19 (!) 23 16 16   Temp: 98.1 F (36.7 C)   98.1 F (36.7 C)  TempSrc: Oral   Other (Comment)  SpO2: 94% 95% 96% 96%  Weight:      Height:       Physical Exam Constitutional:      General: He is not in acute distress.    Appearance: He is not ill-appearing.  Cardiovascular:     Rate and Rhythm: Normal rate. Rhythm irregular.     Heart sounds: Normal heart sounds.  Pulmonary:     Effort: Pulmonary effort is normal.  Musculoskeletal:     Right lower leg: No edema.     Left lower leg: No edema.  Skin:    General: Skin is warm and dry.  Neurological:     Mental Status: He is alert.   Assessment/Plan:  Principal Problem:   Fracture Active Problems:   Essential hypertension   ESRD on hemodialysis (HCC)   Atrial fibrillation (HCC)   Hypocalcemia   Prostate cancer metastatic to bone [facility])  Mr. Stead is a 63 year old M with significant PMH of ESRD on HD, a fib, HTN, metastatic prostate cancer, and CVA who presented for leg pain after a fall. He was found to have a distal comminuted L femur fracture.   Femur fracture Post-op day78from ORIF of distal femur fracture with intercondylar extension and intramedullary nail fixation. - awaiting social work for SNF referrals - DVT ppx with pt's home apixaban 5mg  BID - pain control withOxy IR 5mg  PO q4h PRN - current bowel regimen with miralax and senna  - continue PT/OT  Orthopedics consulted - non-weight bearing on LLE for 6 weeks - unrestricted ROM of knee and hip - do not rest knee in flexion so as to prevent development  of further flexion contracture - dressing change today, clean surgical sites with soap and water only - ice for pain and swelling - follow-up with orthopedic 10 days after discharge for x-rays and suture removal   Metastatic prostate cancer Pt with prostate cancer and associated bony metastasis and cancer related painwhich has been managed by urology thus far.  - discussed with Dr. Julien Nordmann on 12/21 who will coordinate for outpatient follow-up  - continue bicalutamide  ESRD on HD Continue HD on TTS schedule.  Anemia Due to acute blood loss anemia secondary to trauma and surgery on top of anemia of chronic disease. Transfused twice this admission. -Hgb 8.1 this morning - continue to monitor  Hypocalcemia - uncertain etiology Corrected Ca8.5 today - continue calcitriol 23mcg TTS and Tums 400mg  elemental Ca TID + qhs  Diabetes - added novolog 3u TID with meals - moderate SSI and qhs  A fib HR better controlled this morning. - continue metoprololtartrate50mg BID - restarted anticoagulation as above   Diet - renal/carb modified Fluids - none DVT ppx-apixaban 5mg  BID CODE STATUS- FULL CODE  Dispo: Medically stable for discharge. Pending SNF placement.  Ladona Horns, MD 05/24/2019, 6:44 AM Pager: [phone]

## 2019-05-24 NOTE — Progress Notes (Signed)
[facility] ROUNDING NOTE   Subjective:   This is a 63 year old gentleman who has a past medical history end-stage renal disease hemodialysis dependent on it TTS schedule.  Atrial fibrillation hypertension prostate cancer and CVA who presented with a distal femur fracture with intercondyle extension that required intramedullary nail placement and open reduction internal fixation.  Interestingly he has a history of prostate cancer associated with bony metastases and cancer pain related to this.  He is managed by urology and has been seen by [facility] hematology oncology.    Last dialysis 05/21/2019 with removal of 2 L.  He is tolerating his dialysis 05/24/2019  Blood pressure 98/47 pulse 75 temperature 97.4 O2 sats 97% room air  Sodium 132 potassium 3.9 chloride 90 CO2 28 BUN 77 creatinine 7.08 glucose 199 calcium 7.0 phosphorus 4.4 albumin 2.1 WBC 6.8 hemoglobin 8.1 platelets 198   Amitriptyline 100 mg nightly, Eliquis 5 mg twice daily, Casodex 50 mg daily, calcitriol 4 mcg Tuesday Thursday Saturday with dialysis, Tums 2 tablets 4 times daily with meals, Auryxia 420 mg 3 times daily with meals, insulin sliding scale, Synthroid 75 mcg daily, Lopressor 50 mg twice daily, Zoloft 50 mg daily,   Objective:  Vital signs in last 24 hours:  Temp:  [97.4 F (36.3 C)-98.1 F (36.7 C)] 97.4 F (36.3 C) (12/22 0649) Pulse Rate:  [61-142] 75 (12/22 1000) Resp:  [15-23] 15 (12/22 0649) BP: (98-164)/(42-83) 98/47 (12/22 1000) SpO2:  [91 %-98 %] 97 % (12/22 0649) Weight:  [114.4 kg] 114.4 kg (12/22 0649)  Weight change:  Filed Weights   05/21/19 0750 05/21/19 1122 05/24/19 0649  Weight: 114.4 kg 112.5 kg 114.4 kg    Intake/Output: I/O last 3 completed shifts: In: 1895 [P.O.:1580; Blood:315] Out: 3075 [Urine:3075]   Intake/Output this shift:  No intake/output data recorded.  Gen: NAD CVS: no rub Resp: cta Abd: +BS< soft, nt/nd Ext: no edema, LAVF +T/B   Basic Metabolic  Panel: Recent Labs  Lab 05/18/19 0244 05/19/19 0855 05/20/19 0307 05/21/19 0651 05/22/19 0649 05/23/19 0334 05/24/19 0133  NA 134* 133* 134* 135 129* 131* 132*  K 4.7 4.5 4.1 4.6 3.6 4.0 3.9  CL 98 96* 96* 96* 91* 92* 90*  CO2 23 22 25 25 26 24 28   GLUCOSE 239* 200* 215* 166* 227* 184* 199*  BUN 44* 68* 40* 65* 49* 64* 77*  CREATININE 4.56* 7.11* 4.86* 6.23* 5.11* 6.11* 7.08*  CALCIUM 7.2* 6.2* 6.8* 6.2* 6.6* 6.7* 7.0*  MG  --  2.1  --   --   --   --   --   PHOS 5.1*  --   --  5.8* 4.1 4.3 4.4    Liver Function Tests: Recent Labs  Lab 05/18/19 0244 05/21/19 0651 05/22/19 0649 05/23/19 0334 05/24/19 0133  ALBUMIN 2.7* 2.3* 2.2* 2.2* 2.1*   No results for input(s): LIPASE, AMYLASE in the last 168 hours. No results for input(s): AMMONIA in the last 168 hours.  CBC: Recent Labs  Lab 05/21/19 2104 05/22/19 0649 05/23/19 0334 05/23/19 2022 05/24/19 0133  WBC 7.5 7.5 6.9 7.1 6.8  HGB 8.4* 7.6* 7.1* 7.8* 8.1*  HCT 25.9* 23.5* 21.8* 23.1* 25.3*  MCV 92.2 92.2 90.5 88.5 90.0  PLT 186 193 177 174 198    Cardiac Enzymes: No results for input(s): CKTOTAL, CKMB, CKMBINDEX, TROPONINI in the last 168 hours.  BNP: Invalid input(s): POCBNP  CBG: Recent Labs  Lab 05/22/19 2104 05/23/19 0720 05/23/19 1139 05/23/19 1540 05/23/19 2032  GLUCAP 246* [place]*    Microbiology: Results for orders placed or performed during the hospital encounter of 05/17/19  SARS CORONAVIRUS 2 (TAT 6-24 HRS) Nasopharyngeal Nasopharyngeal Swab     Status: None   Collection Time: 05/18/19  2:44 AM   Specimen: Nasopharyngeal Swab  Result Value Ref Range Status   SARS Coronavirus 2 NEGATIVE NEGATIVE Final    Comment: (NOTE) SARS-CoV-2 target nucleic acids are NOT DETECTED. The SARS-CoV-2 RNA is generally detectable in upper and lower respiratory specimens during the acute phase of infection. Negative results do not preclude SARS-CoV-2 infection, do not rule out co-infections  with other pathogens, and should not be used as the sole basis for treatment or other patient management decisions. Negative results must be combined with clinical observations, patient history, and epidemiological information. The expected result is Negative. Fact Sheet for Patients: SugarRoll.be Fact Sheet for Healthcare Providers: https://www.woods-mathews.com/ This test is not yet approved or cleared by the [place] FDA and  has been authorized for detection and/or diagnosis of SARS-CoV-2 by FDA under an Emergency Use Authorization (EUA). This EUA will remain  in effect (meaning this test can be used) for the duration of the COVID-19 declaration under Section 56 4(b)(1) of the Act, 21 U.S.C. section 360bbb-3(b)(1), unless the authorization is terminated or revoked sooner. Performed at [facility], [place] [address], [place], [place] [address]   MRSA PCR Screening     Status: None   Collection Time: 05/18/19  4:12 AM   Specimen: Nasopharyngeal  Result Value Ref Range Status   MRSA by PCR NEGATIVE NEGATIVE Final    Comment:        The GeneXpert MRSA Assay (FDA approved for NASAL specimens only), is one component of a comprehensive MRSA colonization surveillance program. It is not intended to diagnose MRSA infection nor to guide or monitor treatment for MRSA infections. Performed at [facility], [place] [address] [place], [place] [address]     Coagulation Studies: No results for input(s): LABPROT, INR in the last 72 hours.  Urinalysis: No results for input(s): COLORURINE, LABSPEC, PHURINE, GLUCOSEU, HGBUR, BILIRUBINUR, KETONESUR, PROTEINUR, UROBILINOGEN, NITRITE, LEUKOCYTESUR in the last 72 hours.  Invalid input(s): APPERANCEUR    Imaging: No results found.   Medications:   . sodium chloride    . sodium chloride     . sodium chloride   Intravenous Once  . amitriptyline  100 mg Oral QHS  . apixaban  5 mg  Oral BID  . bicalutamide  50 mg Oral Daily  . calcitRIOL      . calcitRIOL  4 mcg Oral Once per day on Tue Thu Sat  . calcium carbonate  2 tablet Oral TID WC & HS  . Chlorhexidine Gluconate Cloth  6 each Topical Daily  . Chlorhexidine Gluconate Cloth  6 each Topical Q0600  . ferric citrate  420 mg Oral TID WC  . insulin aspart  0-15 Units Subcutaneous TID WC  . insulin aspart  0-5 Units Subcutaneous QHS  . insulin aspart  3 Units Subcutaneous TID WC  . levothyroxine  75 mcg Oral Q0600  . metoprolol tartrate  50 mg Oral BID  . polyethylene glycol  17 g Oral Daily  . senna  2 tablet Oral BID  . sertraline  50 mg Oral Daily   sodium chloride, sodium chloride, acetaminophen **OR** acetaminophen, alteplase, heparin, lidocaine (PF), lidocaine-prilocaine, oxyCODONE, pentafluoroprop-tetrafluoroeth  Assessment/ Plan:  1. Left distal femur fracture s/p ORIF today  by Dr. Marcelino Scot. Tolerated surgery well and is without significant discomfort but will likely require SNF placement  2. ESRD- cont with TTS schedule and high Ca bath tolerated ultrafiltration of 2 L on 05/21/2019.  He is tolerating his dialysis treatment 05/24/2019 3. Hypocalcemia- on po calcium, high dose vit D and added Ca bath with HD.  Corrected calcium now appears above 8. 4. HTN- stable 5. DM per primary 6. ABLA on anemia of ckd stage 5- post op drop in hgb. Transfuse prn. No ESA in setting of malignancy 7. Metastatic prostate cancer- per primary. 8. Disposition- will need SNF placement until he can manage ADLs   LOS: [place] @TODAY @10 :35 AM

## 2019-05-24 NOTE — Progress Notes (Signed)
NAME:  Richard Hall, MRN:  [id number], DOB:  09/10/1955, LOS: 8 ADMISSION DATE:  05/17/2019, Primary: [name], MD  CHIEF COMPLAINT:  fall   Brief History  Richard Hall is a 63 yo gentleman with relevant PMH of metastatic prostate cancer, HD dependent ESRD, and atrial fibrillation (on eliquis) who was recently hospitalized 12/12-12/15 for severe hypocalcemia and Vitamin D deficiency which improved with oral supplementation. During that admission, he was evaluated by PT at which time no further outpatient PT needs were required. On day of hosital discharge (12/15), he returned to the ED via EMS following a mechanical fall at home. Imaging revealed comminuted, posteriorly displaced and angulated fracture of the left distal femoral metadiaphysis with extension into the posterior intercondylar fossa and posterior displacement of the distal fragment.  Subjective  No overnight events. Ready for discharge   [facility] Events   12/15 admission. Imaging suggestive of left femoral fx. Ortho consulted. 12/18 ORIF   Objective   Blood pressure 103/69, pulse 80, temperature 97.8 F (36.6 C), temperature source Oral, resp. rate 17, height 6' (1.829 m), weight 114.4 kg, SpO2 96 %.     Intake/Output Summary (Last 24 hours) at 05/25/2019 0456 Last data filed at 05/25/2019 0237 Gross per 24 hour  Intake 960 ml  Output 3725 ml  Net -2765 ml   Filed Weights   05/21/19 0750 05/21/19 1122 05/24/19 0649  Weight: 114.4 kg 112.5 kg 114.4 kg    Examination: GENERAL: in no acute distress CARDIAC: regular rate. Extremities warm PULMONARY: acyanotic. Lung sounds clear to auscultation. SKIN: no rash or lesions on limited exam. Intact dressing to left lateral hip without surrounding erythema or edema.  Significant Diagnostic Tests:  12/15 left femur XR>acute, mildly comminuted fracture of the distal diaphysis and metaphysis of the femur. There is dorsal angulation of the distal fragment of  approximately 30 degrees. 1/2 shaft width posterior displacement of the distal fragment is also seen and there is fragment override of approximately 3 cm 12/15 left knee CT> Comminuted, posteriorly displaced and angulated fracture of the distal femoral metadiaphysis with extension into the posterior intercondylar fossa. A cortically based sclerotic focus in the anterior distal femoral diaphysis measuring 1.7 x 0.8 x 2.3 cm in size is likely a sclerotic focus of prostate metastasis corresponding well to the location of increased uptake on recent bone scan. Additional sclerotic focus is involved within an anteromedial fracture fragment. Suggesting at least some underlying pathologic component. Diffuse bony demineralization with intra trabecular hemorrhage. Large lipohemarthrosis. Intramuscular hemorrhage of the distal thigh musculature medially adjacent the comminuted fracture fragments. Extensive vascular calcium noted in the distal SFA, popliteal artery, TP trunk and anterior tibial artery. 12/17 CT pelvis> widespread osseous metastatic disease throughout the lower lumbar spine, pelvis and both proximal to mid femurs corresponding to increase uptake seen on bone scan from one month prior. New compared to CT in August.  Labs    CBC Latest Ref Rng & Units 05/25/2019 05/24/2019 05/23/2019  WBC 4.0 - 10.5 K/uL 7.2 6.8 7.1  Hemoglobin 13.0 - 17.0 g/dL 8.2(L) 8.1(L) 7.8(L)  Hematocrit 39.0 - 52.0 % 25.9(L) 25.3(L) 23.1(L)  Platelets 150 - 400 K/uL 204 198 174   BMP Latest Ref Rng & Units 05/25/2019 05/24/2019 05/23/2019  Glucose 70 - 99 mg/dL 185(H) 199(H) 184(H)  BUN 8 - 23 mg/dL 46(H) 77(H) 64(H)  Creatinine 0.61 - 1.24 mg/dL 4.31(H) 7.08(H) 6.11(H)  Sodium 135 - 145 mmol/L 132(L) 132(L) 131(L)  Potassium 3.5 - 5.1  mmol/L 3.9 3.9 4.0  Chloride 98 - 111 mmol/L 92(L) 90(L) 92(L)  CO2 22 - 32 mmol/L 29 28 24   Calcium 8.9 - 10.3 mg/dL 7.7(L) 7.0(L) 6.7(L)    Summary  Richard Hall is a 63 yo male with  diffuse metastatic prostate cancer to the bones who was admitted on 05/17/19 following a mechanical fall occurring at home and was subsequently found to have a distal femur fracture that underwent repair with ORIF on 12/18.  Assessment & Plan:  Principal Problem:   Fracture Active Problems:   Essential hypertension   ESRD on hemodialysis [facility])   Atrial fibrillation (HCC)   Hypocalcemia   Prostate cancer metastatic to bone [facility])  Pathological comminuted, posteriorly displaced and angulated fracture of the distal femoral metadiaphysis s/p ORIF on 12/18. POD#5.  In setting of prostatic cancer with metastatic osseous involvement. Plan Continue PT/OT Pain management: oxy IR 5mg  po q4h prn. miralax and senna bowel regimen.  Post op restrictions: non-weight bearing on LLE for 6w. Unrestricted ROM of knee and hip. Do not rest knee in flexion to prevent further flexion contracture Post op wound management: clean surgical site with soap and water only Post op follow up: ortho clinic 10d post discharge for xray and suture removal SW/casemanagement working on SNF discharge  Metastatic prostate cancer with osseous involvement. Previously managed by urology. IMTS discussed with mohamed on 12/21 who will work on coordinating outpatient follow up. Continue bicalutamide.  HD dependent ESRD (TTS). Management per nephrology. Chronic Hypocalcemia. Continue calcitriol and tums  Acute on chronic anemia. Acute component post operative in nature. Chronic compondent can likely be attributable to ESRD, ACD, and mets. Total transfusions this admission: 2. Hgb stable this morning.   Type 2 Diabetes Mellitus.  Moderate SSI with HS coverage. 3U novolog with meals. CBGs.   Atrial fibrillation. Rate controlled with metoprolol. Continue eliquis. Telemetry monitoring.  Best practice:  CODE STATUS: FULL Diet: renal/diabetic DVT for prophylaxis: eliquis Dispo: discharge to SNF   Mitzi Hansen, MD [place] PGY-1 PAGER #: [phone] 05/25/19 4:56 AM

## 2019-05-24 NOTE — TOC Progression Note (Signed)
Transition of Care [facility]) - Progression Note    Patient Details  Name: Kayvan Hoefling MRN: [id number] Date of Birth: 1955/08/22  Transition of Care [facility]) CM/SW [place], LCSW Phone Number: 05/24/2019, 12:01 PM  Clinical Narrative:    CSW presented bed offers to patient. He has selected [place]. CSW requesting updated COVID test from MD for potential discharge tomorrow.    Expected Discharge Plan: Skilled Nursing Facility Barriers to Discharge: Continued Medical Work up  Expected Discharge Plan and Services Expected Discharge Plan: [place] In-house Referral: Clinical Social Work Discharge Planning Services: NA Post Acute Care Choice: [place] Living arrangements for the past 2 months: Apartment                 DME Arranged: N/A DME Agency: NA         [place] Agency: NA         Social Determinants of Health (SDOH) Interventions    Readmission Risk Interventions Readmission Risk Prevention Plan 01/12/2019  Transportation Screening Complete  PCP or Specialist Appt within 3-5 Days Complete  HRI or [place] Complete  Social Work Consult for [place] Planning/Counseling Complete  Palliative Care Screening Not Applicable  Medication Review Press photographer) Complete

## 2019-05-24 NOTE — Progress Notes (Signed)
PT Cancellation Note  Patient Details Name: Richard Hall MRN: [id number] DOB: July 28, 1955   Cancelled Treatment:    Reason Eval/Treat Not Completed: Patient at procedure or test/unavailable  Patient in dialysis. If unable to see later today, will plan to see Wednesday (off day for his dialysis schedule)   [name], PT Pager [phone]  [name] 05/24/2019, 11:39 AM

## 2019-05-25 DIAGNOSIS — Z8639 Personal history of other endocrine, nutritional and metabolic disease: Secondary | ICD-10-CM

## 2019-05-25 DIAGNOSIS — Z888 Allergy status to other drugs, medicaments and biological substances status: Secondary | ICD-10-CM

## 2019-05-25 DIAGNOSIS — Z794 Long term (current) use of insulin: Secondary | ICD-10-CM

## 2019-05-25 LAB — RENAL FUNCTION PANEL
Albumin: 2.1 g/dL — ABNORMAL LOW (ref 3.5–5.0)
Anion gap: 11 (ref 5–15)
BUN: 46 mg/dL — ABNORMAL HIGH (ref 8–23)
CO2: 29 mmol/L (ref 22–32)
Calcium: 7.7 mg/dL — ABNORMAL LOW (ref 8.9–10.3)
Chloride: 92 mmol/L — ABNORMAL LOW (ref 98–111)
Creatinine, Ser: 4.31 mg/dL — ABNORMAL HIGH (ref 0.61–1.24)
GFR calc Af Amer: 16 mL/min — ABNORMAL LOW (ref >60)
GFR calc non Af Amer: 14 mL/min — ABNORMAL LOW (ref >60)
Glucose, Bld: 185 mg/dL — ABNORMAL HIGH (ref 70–99)
Phosphorus: 3.2 mg/dL (ref 2.5–4.6)
Potassium: 3.9 mmol/L (ref 3.5–5.1)
Sodium: 132 mmol/L — ABNORMAL LOW (ref 135–145)

## 2019-05-25 LAB — CBC
HCT: 25.9 % — ABNORMAL LOW (ref 39.0–52.0)
Hemoglobin: 8.2 g/dL — ABNORMAL LOW (ref 13.0–17.0)
MCH: 29 pg (ref 26.0–34.0)
MCHC: 31.7 g/dL (ref 30.0–36.0)
MCV: 91.5 fL (ref 80.0–100.0)
Platelets: 204 10*3/uL (ref 150–400)
RBC: 2.83 MIL/uL — ABNORMAL LOW (ref 4.22–5.81)
RDW: 18 % — ABNORMAL HIGH (ref 11.5–15.5)
WBC: 7.2 10*3/uL (ref 4.0–10.5)
nRBC: 1.9 % — ABNORMAL HIGH (ref 0.0–0.2)

## 2019-05-25 LAB — SURGICAL PATHOLOGY

## 2019-05-25 LAB — GLUCOSE, CAPILLARY: Glucose-Capillary: 166 mg/dL — ABNORMAL HIGH (ref 70–99)

## 2019-05-25 MED ORDER — METOPROLOL TARTRATE 50 MG PO TABS: 50.0000 mg | ORAL_TABLET | Freq: Two times a day (BID) | ORAL | 0 refills | Status: DC

## 2019-05-25 MED ORDER — SERTRALINE HCL 50 MG PO TABS: 50.0000 mg | ORAL_TABLET | Freq: Every day | ORAL | 0 refills | Status: DC

## 2019-05-25 MED ORDER — GLIPIZIDE 5 MG PO TABS: 5.0000 mg | ORAL_TABLET | Freq: Every day | ORAL | 0 refills | Status: DC

## 2019-05-25 MED ORDER — FUROSEMIDE 80 MG PO TABS: 80.0000 mg | ORAL_TABLET | ORAL | Status: DC

## 2019-05-25 MED ORDER — CALCIUM CARBONATE ANTACID 500 MG PO CHEW: 2.0000 | CHEWABLE_TABLET | Freq: Three times a day (TID) | ORAL | 0 refills | Status: DC

## 2019-05-25 MED ORDER — CALCITRIOL 0.5 MCG PO CAPS: 4.0000 ug | ORAL_CAPSULE | ORAL | 0 refills | Status: DC

## 2019-05-25 MED ORDER — FERRIC CITRATE 1 GM 210 MG(FE) PO TABS: 420.0000 mg | ORAL_TABLET | Freq: Three times a day (TID) | ORAL | 0 refills | Status: DC

## 2019-05-25 MED ORDER — CHLORHEXIDINE GLUCONATE CLOTH 2 % EX PADS: 6.0000 | MEDICATED_PAD | Freq: Every day | CUTANEOUS | Status: DC

## 2019-05-25 MED ORDER — POLYETHYLENE GLYCOL 3350 17 G PO PACK: 17.0000 g | PACK | Freq: Every day | ORAL | 0 refills | Status: DC

## 2019-05-25 MED ORDER — EUTHYROX 75 MCG PO TABS: 75.0000 ug | ORAL_TABLET | Freq: Every morning | ORAL | 0 refills | Status: DC

## 2019-05-25 MED ORDER — APIXABAN 5 MG PO TABS: 5.0000 mg | ORAL_TABLET | Freq: Two times a day (BID) | ORAL | 0 refills | Status: DC

## 2019-05-25 MED ORDER — ELIGARD 45 MG ~~LOC~~ KIT: 45.0000 mg | PACK | SUBCUTANEOUS | 1 refills | Status: DC

## 2019-05-25 MED ORDER — SENNA 8.6 MG PO TABS: 2.0000 | ORAL_TABLET | Freq: Two times a day (BID) | ORAL | 0 refills | Status: DC

## 2019-05-25 MED ORDER — AMITRIPTYLINE HCL 100 MG PO TABS: 100.0000 mg | ORAL_TABLET | Freq: Every day | ORAL | 0 refills | Status: DC

## 2019-05-25 MED ORDER — LINZESS 72 MCG PO CAPS: 72.0000 ug | ORAL_CAPSULE | ORAL | 0 refills | Status: DC | PRN

## 2019-05-25 MED ORDER — BICALUTAMIDE 50 MG PO TABS: 50.0000 mg | ORAL_TABLET | Freq: Every day | ORAL | Status: DC

## 2019-05-25 MED ORDER — LIDOCAINE-PRILOCAINE 2.5-2.5 % EX CREA: 1.0000 "application " | TOPICAL_CREAM | CUTANEOUS | 0 refills | Status: DC

## 2019-05-25 MED ORDER — FLUTICASONE PROPIONATE 50 MCG/ACT NA SUSP: 1.0000 | NASAL | 2 refills | Status: DC | PRN

## 2019-05-25 MED ORDER — OXYCODONE HCL 5 MG PO TABS: 5.0000 mg | ORAL_TABLET | ORAL | 0 refills | Status: AC | PRN

## 2019-05-25 NOTE — TOC Transition Note (Signed)
Transition of Care [facility]) - CM/SW Discharge Note   Patient Details  Name: Richard Hall MRN: [id number] Date of Birth: [birth year]  Transition of Care [facility]) CM/SW Contact:  Benard Halsted, LCSW Phone Number: 05/25/2019, 10:33 AM   Clinical Narrative:    Patient will DC to: [place] date: 05/25/19 Family notified: Son, [name] Transport by: Corey Harold   Per MD patient ready for DC to [place]. RN, patient, patient's family, and facility notified of DC. Discharge Summary and FL2 sent to facility. RN to call report prior to discharge [phone] Room 113). DC packet on chart. Ambulance transport requested for patient.   CSW will sign off for now as social work intervention is no longer needed. Please consult [place] again if new needs arise.  Cedric Fishman, LCSW Clinical Social Worker [phone]    Final next level of care: Skilled Nursing Facility Barriers to Discharge: No Barriers Identified   Patient Goals and CMS Choice Patient states their goals for this hospitalization and ongoing recovery are:: Pt wants to go to SNF CMS Medicare.gov Compare Post Acute Care list provided to:: Patient Choice offered to / list presented to : Patient  Discharge Placement   Existing PASRR number confirmed : 05/25/19          Patient [facility] bed at: [facility] Patient to be transferred to facility by: [place] Name of family member notified: Son, [name] Patient and family notified of of transfer: 05/25/19  Discharge Plan and Services In-house Referral: Clinical Social Work Discharge Planning Services: NA Post Acute Care Choice: [place]          DME Arranged: N/A DME Agency: NA         [place] Agency: NA        Social Determinants of Health ([place]) Interventions     Readmission Risk Interventions Readmission Risk Prevention Plan 01/12/2019  Transportation Screening Complete  PCP or Specialist Appt within 3-5 Days Complete  HRI or  [place] Complete  Social Work Consult for [place] Planning/Counseling Complete  Palliative Care Screening Not Applicable  Medication Review Press photographer) Complete

## 2019-05-25 NOTE — Discharge Summary (Signed)
Name: Richard Hall MRN: [id number] DOB: 01-14-1956 63 y.o. PCP: Richard Mc, MD  Date of Admission: 05/17/2019  7:16 PM Date of Discharge:  Attending Physician: Richard Kilts, MD  Discharge Diagnosis: 1. Pathological comminuted, posteriorly displaced and angulated fracture of the distal femoral metadiaphysis s/p ORIF on 12/18.  2. Prostate cancer with osseous metastasis  Discharge Medications: Allergies as of 05/25/2019      Reactions   Gabapentin Swelling      Medication List    STOP taking these medications   hydrALAZINE 100 MG tablet Commonly known as: APRESOLINE   isosorbide mononitrate 60 MG 24 hr tablet Commonly known as: IMDUR   NIFEdipine 90 MG 24 hr tablet Commonly known as: ADALAT CC   Percocet 5-325 MG tablet Generic drug: oxyCODONE-acetaminophen     TAKE these medications   amitriptyline 100 MG tablet Commonly known as: ELAVIL Take 1 tablet (100 mg total) by mouth at bedtime.   apixaban 5 MG Tabs tablet Commonly known as: ELIQUIS Take 1 tablet (5 mg total) by mouth 2 (two) times daily.   bicalutamide 50 MG tablet Commonly known as: CASODEX Take 1 tablet (50 mg total) by mouth daily.   calcitRIOL 0.5 MCG capsule Commonly known as: ROCALTROL Take 8 capsules (4 mcg total) by mouth 3 (three) times a week. Start taking on: May 26, 2019 What changed: additional instructions   calcium carbonate 500 MG chewable tablet Commonly known as: TUMS - dosed in mg elemental calcium Chew 2 tablets (400 mg of elemental calcium total) by mouth 4 (four) times daily -  with meals and at bedtime.   Euthyrox 75 MCG tablet Generic drug: levothyroxine Take 1 tablet (75 mcg total) by mouth every morning.   ferric citrate 1 GM 210 MG(Fe) tablet Commonly known as: Auryxia Take 2 tablets (420 mg total) by mouth 3 (three) times daily with meals.   fluticasone 50 MCG/ACT nasal spray Commonly known as: Flonase Allergy Relief Place 1 spray into both  nostrils as needed. What changed: how to take this   furosemide 80 MG tablet Commonly known as: LASIX Take 1 tablet (80 mg total) by mouth 4 (four) times a week. Mondays, wednesdays, fridays, and sundays Start taking on: May 26, 2019   glipiZIDE 5 MG tablet Commonly known as: GLUCOTROL Take 1 tablet (5 mg total) by mouth daily.   leuprolide acetate (6 Month) 45 MG injection Generic drug: leuprolide (6 Month) Inject 45 mg into the skin every 6 (six) months.   lidocaine-prilocaine cream Commonly known as: EMLA Apply 1 application topically See admin instructions. Apply small amount to access site 1-2 hours before dialysis, cover with occlusive dressing (saran wrap)   Linzess 72 MCG capsule Generic drug: linaclotide Take 1 capsule (72 mcg total) by mouth as needed.   metoprolol tartrate 50 MG tablet Commonly known as: LOPRESSOR Take 1 tablet (50 mg total) by mouth 2 (two) times daily. What changed:   medication strength  how much to take  when to take this   oxyCODONE 5 MG immediate release tablet Commonly known as: Oxy IR/ROXICODONE Take 1 tablet (5 mg total) by mouth every 4 (four) hours as needed for up to 7 days for moderate pain.   polyethylene glycol 17 g packet Commonly known as: MIRALAX / GLYCOLAX Take 17 g by mouth daily. Start taking on: May 26, 2019   senna 8.6 MG Tabs tablet Commonly known as: SENOKOT Take 2 tablets (17.2 mg total) by mouth 2 (two) times  daily.   sertraline 50 MG tablet Commonly known as: ZOLOFT Take 1 tablet (50 mg total) by mouth daily.            Discharge Care Instructions  (From admission, onward)         Start     Ordered   05/25/19 0000  Non weight bearing    Comments: Post op restrictions: non-weight bearing on LLE for 6w. Unrestricted ROM of knee and hip. Do not rest knee in flexion to prevent further flexion contracture Post op wound management: clean surgical site with soap and water only   05/25/19 0956            Disposition and follow-up:   [name] was discharged from [facility] in Stable condition.  At the hospital follow up visit please address:  Displaced distal femur fracture. He is to remain non weight bearing for 6w. He should follow up with the orthopedic clinic 10 days after discharge at which time he will have sutures removed and follow up xray.  Prostate cancer with bone metastasis. Likely contributed to fracture. Was managed by urology prior to admission and has been taking bicalutamide (Casodex). We consulted oncology during this admission and they will see him outpatient. Please call and arrange an appointment.  Hypertension/atrial fibrillation. Please re-evaluate the need to restart nifedipine, hydralazine and imdur at time of follow up.   2.  Labs / imaging needed at time of follow-up: none   Follow-up Appointments:  Contact information for follow-up providers    Richard [place], MD. Schedule an appointment as soon as possible for a visit in 2 weeks.   Specialty: Orthopedic Surgery Why: For suture removal and repeat x-ray Contact information: [place] [address] [phone]        Richard Bears, MD Follow up.   Specialty: Oncology Contact information: [place] [address] [phone]        Richard Mc, MD Follow up.   Specialty: Family Medicine Contact information: [address] [place] [address] [phone]            Contact information for after-discharge care    [facility] .   Service: Skilled Nursing Contact information: [address] [place] Hospital Course: Richard Hall is a 63 yo gentleman with relevant PMH of metastatic prostate cancer (dx August 2020), HD dependent ESRD (TTS), and atrial fibrillation (on eliquis) who was recently  hospitalized 12/12-12/15 for severe hypocalcemia and Vitamin D deficiency which improved with oral supplementation. During that admission, he was evaluated by PT at which time no further outpatient PT needs were required.  On day of hosital discharge (12/15), he returned to the ED via EMS following a mechanical fall at home. Imaging revealed comminuted, posteriorly displaced and angulated fracture of the left distal femoral metadiaphysis with extension into the posterior intercondylar fossa and posterior displacement of the distal fragment. He underwent ORIF on 12/18 with intercondylar extension and intramedullary nail fixation.   Fracture was likely precipitated by significant osseous mets from prostate cancer that patient reports was diagnosed in August of this year. Bone scan in November and CT pelvis on this admission indicates several lesions. He was previously being managed by urology for this and was taking biclutamide and lupralide. Oncology was consulted during this admission  and would like to have him be seen outpatient. He has a chronic foley catheter for urinary retention secondary to this.  Additionally, his blood pressure and atrial fibrillation was managed over the course of his hospitalization. He was previously on 80mg  lasix 4x/week, 25mg  metoprolol daily, 50mg  hydralazine TID, 60mg  imdur daily and 90mg  nifedipine. Hydralazine, imdur and nifedipine were held on admission. His blood pressures remained within range despite this so they were held on discharge as well. His metoprolol was increased to 50mg  BID during his hospitalization has he went back into atrial fibrillation and his heart rates were trending up. HR improved with the increased dose and he was discharged on 50mg  metoprolol BID.   Discharge Vitals:   BP 123/62 (BP Location: Right Arm)   Pulse 70   Temp 97.6 F (36.4 C) (Oral)   Resp 17   Ht 6' (1.829 m)   Wt 114.4 kg   SpO2 95%   BMI 34.21 kg/m   Pertinent Labs,  Studies, and Procedures:  12/15 left femur XR>acute, mildly comminuted fracture of the distal diaphysis and metaphysis of the femur. There is dorsal angulation of the distal fragment of approximately 30 degrees. 1/2 shaft width posterior displacement of the distal fragment is also seen and there is fragment override of approximately 3 cm 12/15 left knee CT> Comminuted, posteriorly displaced and angulated fracture of the distal femoral metadiaphysis with extension into the posterior intercondylar fossa. A cortically based sclerotic focus in the anterior distal femoral diaphysis measuring 1.7 x 0.8 x 2.3 cm in size is likely a sclerotic focus of prostate metastasis corresponding well to the location of increased uptake on recent bone scan. Additional sclerotic focus is involved within an anteromedial fracture fragment. Suggesting at least some underlying pathologic component. Diffuse bony demineralization with intra trabecular hemorrhage. Large lipohemarthrosis. Intramuscular hemorrhage of the distal thigh musculature medially adjacent the comminuted fracture fragments. Extensive vascular calcium noted in the distal SFA, popliteal artery, TP trunk and anterior tibial artery. 12/17 CT pelvis> widespread osseous metastatic disease throughout the lower lumbar spine, pelvis and both proximal to mid femurs corresponding to increase uptake seen on bone scan from one month prior. New compared to CT in August. Discharge Instructions    Non weight bearing   Complete by: As directed    Post op restrictions: non-weight bearing on LLE for 6w. Unrestricted ROM of knee and hip. Do not rest knee in flexion to prevent further flexion contracture Post op wound management: clean surgical site with soap and water only      Signed:  Mitzi Hansen, MD 05/25/2019, 9:56 AM   Pager: [phone]

## 2019-05-25 NOTE — Progress Notes (Signed)
Patient to be discharged today I believe he is stable for discharge.  His next dialysis treatment will be 05/28/2019

## 2019-05-25 NOTE — Discharge Planning (Signed)
Notified [place] that patient is discharging to [facility] today. They are expecting him for treatment on Saturday.

## 2019-05-25 NOTE — Progress Notes (Addendum)
[facility] ROUNDING NOTE   Subjective:   This is a 63 year old gentleman who has a past medical history end-stage renal disease hemodialysis dependent on it TTS schedule.  Atrial fibrillation hypertension prostate cancer and CVA who presented with a distal femur fracture with intercondyle extension that required intramedullary nail placement and open reduction internal fixation.  Interestingly he has a history of prostate cancer associated with bony metastases and cancer pain related to this.  He is managed by urology and has been seen by [facility] hematology oncology.  Underwent dialysis 05/24/2019 with removal of 3 L.  He has a dialysis chair that is Monday Wednesday Friday [place].  However is closed on Christmas day.  We will plan his next dialysis treatment 05/26/2019 and then hopefully get him on schedule.  Blood pressure 133/62 pulse 74 temperature 97.6 O2 sats 96% room air  Sodium 132 potassium 3.9 chloride 92 BUN 46 creatinine 4.31 CO2 29 glucose 185 calcium 7.7 phosphorus 3.2 albumin 2.1 WBC 7.2 hemoglobin 8.2 platelets 204   Amitriptyline 100 mg nightly, Eliquis 5 mg twice daily, Casodex 50 mg daily, calcitriol 4 mcg Tuesday Thursday Saturday with dialysis, Tums 2 tablets 4 times daily with meals, Auryxia 420 mg 3 times daily with meals, insulin sliding scale, Synthroid 75 mcg daily, Lopressor 50 mg twice daily, Zoloft 50 mg daily,   Objective:  Vital signs in last 24 hours:  Temp:  [97.5 F (36.4 C)-98.8 F (37.1 C)] 97.6 F (36.4 C) (12/23 0800) Pulse Rate:  [62-117] 70 (12/23 0800) Resp:  [17-22] 17 (12/23 0416) BP: (103-153)/(54-99) 123/62 (12/23 0800) SpO2:  [90 %-99 %] 95 % (12/23 0800)  Weight change:  Filed Weights   05/21/19 0750 05/21/19 1122 05/24/19 0649  Weight: 114.4 kg 112.5 kg 114.4 kg    Intake/Output: I/O last 3 completed shifts: In: 6 [P.O.:960] Out: 4970 [Urine:2450; Other:3000]   Intake/Output this shift:  No intake/output data  recorded.  Gen: NAD CVS: no rub Resp: cta Abd: +BS< soft, nt/nd Ext: no edema, LAVF +T/B   Basic Metabolic Panel: Recent Labs  Lab 05/19/19 0855 05/21/19 0651 05/22/19 0649 05/23/19 0334 05/24/19 0133 05/25/19 0213  NA 133* 135 129* 131* 132* 132*  K 4.5 4.6 3.6 4.0 3.9 3.9  CL 96* 96* 91* 92* 90* 92*  CO2 22 25 26 24 28 29   GLUCOSE 200* 166* 227* 184* 199* 185*  BUN 68* 65* 49* 64* 77* 46*  CREATININE 7.11* 6.23* 5.11* 6.11* 7.08* 4.31*  CALCIUM 6.2* 6.2* 6.6* 6.7* 7.0* 7.7*  MG 2.1  --   --   --   --   --   PHOS  --  5.8* 4.1 4.3 4.4 3.2    Liver Function Tests: Recent Labs  Lab 05/21/19 0651 05/22/19 0649 05/23/19 0334 05/24/19 0133 05/25/19 0213  ALBUMIN 2.3* 2.2* 2.2* 2.1* 2.1*   No results for input(s): LIPASE, AMYLASE in the last 168 hours. No results for input(s): AMMONIA in the last 168 hours.  CBC: Recent Labs  Lab 05/22/19 0649 05/23/19 0334 05/23/19 2022 05/24/19 0133 05/25/19 0213  WBC 7.5 6.9 7.1 6.8 7.2  HGB 7.6* 7.1* 7.8* 8.1* 8.2*  HCT 23.5* 21.8* 23.1* 25.3* 25.9*  MCV 92.2 90.5 88.5 90.0 91.5  PLT 193 177 174 198 204    Cardiac Enzymes: No results for input(s): CKTOTAL, CKMB, CKMBINDEX, TROPONINI in the last 168 hours.  BNP: Invalid input(s): POCBNP  CBG: Recent Labs  Lab 05/23/19 1540 05/23/19 2032 05/24/19 1200 05/24/19 1612  05/24/19 [place]    Microbiology: Results for orders placed or performed during the hospital encounter of 05/17/19  SARS CORONAVIRUS 2 (TAT 6-24 HRS) Nasopharyngeal Nasopharyngeal Swab     Status: None   Collection Time: 05/18/19  2:44 AM   Specimen: Nasopharyngeal Swab  Result Value Ref Range Status   SARS Coronavirus 2 NEGATIVE NEGATIVE Final    Comment: (NOTE) SARS-CoV-2 target nucleic acids are NOT DETECTED. The SARS-CoV-2 RNA is generally detectable in upper and lower respiratory specimens during the acute phase of infection. Negative results do not  preclude SARS-CoV-2 infection, do not rule out co-infections with other pathogens, and should not be used as the sole basis for treatment or other patient management decisions. Negative results must be combined with clinical observations, patient history, and epidemiological information. The expected result is Negative. Fact Sheet for Patients: SugarRoll.be Fact Sheet for Healthcare Providers: https://www.woods-mathews.com/ This test is not yet approved or cleared by the [place] FDA and  has been authorized for detection and/or diagnosis of SARS-CoV-2 by FDA under an Emergency Use Authorization (EUA). This EUA will remain  in effect (meaning this test can be used) for the duration of the COVID-19 declaration under Section 56 4(b)(1) of the Act, 21 U.S.C. section 360bbb-3(b)(1), unless the authorization is terminated or revoked sooner. Performed at [facility], [place] [address] [place], [place] [address]   MRSA PCR Screening     Status: None   Collection Time: 05/18/19  4:12 AM   Specimen: Nasopharyngeal  Result Value Ref Range Status   MRSA by PCR NEGATIVE NEGATIVE Final    Comment:        The GeneXpert MRSA Assay (FDA approved for NASAL specimens only), is one component of a comprehensive MRSA colonization surveillance program. It is not intended to diagnose MRSA infection nor to guide or monitor treatment for MRSA infections. Performed at [facility], [place] [address], [place], [place] [address]   SARS CORONAVIRUS 2 (TAT 6-24 HRS) Nasopharyngeal Nasopharyngeal Swab     Status: None   Collection Time: 05/24/19  1:40 PM   Specimen: Nasopharyngeal Swab  Result Value Ref Range Status   SARS Coronavirus 2 NEGATIVE NEGATIVE Final    Comment: (NOTE) SARS-CoV-2 target nucleic acids are NOT DETECTED. The SARS-CoV-2 RNA is generally detectable in upper and lower respiratory specimens during the acute phase of infection.  Negative results do not preclude SARS-CoV-2 infection, do not rule out co-infections with other pathogens, and should not be used as the sole basis for treatment or other patient management decisions. Negative results must be combined with clinical observations, patient history, and epidemiological information. The expected result is Negative. Fact Sheet for Patients: SugarRoll.be Fact Sheet for Healthcare Providers: https://www.woods-mathews.com/ This test is not yet approved or cleared by the [place] FDA and  has been authorized for detection and/or diagnosis of SARS-CoV-2 by FDA under an Emergency Use Authorization (EUA). This EUA will remain  in effect (meaning this test can be used) for the duration of the COVID-19 declaration under Section 56 4(b)(1) of the Act, 21 U.S.C. section 360bbb-3(b)(1), unless the authorization is terminated or revoked sooner. Performed at [facility], [place] [address] [place], [place] [address]     Coagulation Studies: No results for input(s): LABPROT, INR in the last 72 hours.  Urinalysis: No results for input(s): COLORURINE, LABSPEC, PHURINE, GLUCOSEU, HGBUR, BILIRUBINUR, KETONESUR, PROTEINUR, UROBILINOGEN, NITRITE, LEUKOCYTESUR in the last 72 hours.  Invalid input(s): APPERANCEUR    Imaging: No results found.   Medications:    . sodium chloride   Intravenous Once  . amitriptyline  100 mg Oral QHS  . apixaban  5 mg Oral BID  . bicalutamide  50 mg Oral Daily  . calcitRIOL  4 mcg Oral Once per day on Tue Thu Sat  . calcium carbonate  2 tablet Oral TID WC & HS  . Chlorhexidine Gluconate Cloth  6 each Topical Daily  . Chlorhexidine Gluconate Cloth  6 each Topical Q0600  . ferric citrate  420 mg Oral TID WC  . insulin aspart  0-15 Units Subcutaneous TID WC  . insulin aspart  0-5 Units Subcutaneous QHS  . insulin aspart  3 Units Subcutaneous TID WC  . levothyroxine  75 mcg Oral Q0600   . metoprolol tartrate  50 mg Oral BID  . polyethylene glycol  17 g Oral Daily  . senna  2 tablet Oral BID  . sertraline  50 mg Oral Daily   acetaminophen **OR** acetaminophen, oxyCODONE  Assessment/ Plan:  1. Left distal femur fracture s/p ORIF today by Dr. Marcelino Scot. Tolerated surgery well and is without significant discomfort but will likely require SNF placement  2. ESRD- cont with TTS schedule and high Ca bath tolerated ultrafiltration of 3 L 05/24/2019 next dialysis treatment 05/26/2019.  [facility] outpatient dialysis slot Monday Wednesday Friday  [facility].  It is closed Christmas Day.  We will therefore dialyze 05/26/2019 and hopefully get on schedule. 3. Hypocalcemia- on po calcium, high dose vit D and added Ca bath with HD.  Corrected calcium now appears above 8. 4. HTN- stable 5. DM per primary 6. ABLA on anemia of ckd stage 5- post op drop in hgb. Transfuse prn. No ESA in setting of malignancy 7. Metastatic prostate cancer- per primary. 8. Disposition- will need SNF placement until he can manage ADLs   LOS: [place] @TODAY @10 :35 AM

## 2019-05-25 NOTE — Progress Notes (Signed)
Internal Medicine Attending Note:  I have seen and evaluated this patient and I have discussed the plan of care with the house staff. Please see their note for complete details. I concur with their findings.  Initial plan was for discharge to SNF today, but nephrology is planning for next HD tomorrow 12/24. Will clarify with Dr. Justin Mend. Otherwise, from our standpoint ready for discharge when cleared by nephrology.   Velna Ochs, MD 05/25/2019, 11:13 AM

## 2019-06-01 ENCOUNTER — Telehealth: Payer: Self-pay | Admitting: Medical Oncology

## 2019-06-01 NOTE — Telephone Encounter (Signed)
D/C papers instructions are for  pt to f/u with Dr Julien Nordmann.  Oncology consulted pt in hospital. I sent message to new pt coordinator to arrange appt for his prostate cancer. Pt resides at [facility] and rehab

## 2019-06-08 LAB — GLUCOSE, CAPILLARY: Glucose-Capillary: 160 mg/dL — ABNORMAL HIGH (ref 70–99)

## 2019-06-13 ENCOUNTER — Telehealth: Payer: Self-pay | Admitting: Oncology

## 2019-06-13 NOTE — Telephone Encounter (Signed)
A new pt appt has been scheduled for Richard Hall to see Dr. Alen Blew on 1/19 at 2pm. Pt is currently at [facility]. I provided their transportation coordinator, Elzie Rings, the appt date and time.

## 2019-06-21 ENCOUNTER — Inpatient Hospital Stay: Payer: Medicare Other | Attending: Oncology | Admitting: Oncology

## 2019-06-21 ENCOUNTER — Other Ambulatory Visit: Payer: Self-pay

## 2019-06-21 VITALS — BP 125/69 | HR 68 | Temp 98.5°F | Resp 17

## 2019-06-21 DIAGNOSIS — C7951 Secondary malignant neoplasm of bone: Secondary | ICD-10-CM | POA: Diagnosis not present

## 2019-06-21 DIAGNOSIS — C61 Malignant neoplasm of prostate: Secondary | ICD-10-CM | POA: Diagnosis not present

## 2019-06-21 NOTE — Progress Notes (Signed)
Reason for the request:    Prostate cancer  HPI: I was asked by Dr. Darrick Meigs to evaluate Richard Hall for the diagnosis of prostate cancer.  He is a 64 year old man with a history of diabetes, hypertension and renal insufficiency.  He was hospitalized on multiple occasions in the last 6 months.  He presented in August 2020 with obstructive uropathy and underwent cystoscopy and was found to have bleeding in the bladder without any evidence of malignancy.  His PSA was elevated at 79.65.  He was subsequently found to have advanced prostate cancer and was evaluated by Dr. Tresa Moore.  Bone scan obtained on April 18, 2019 showed a diffuse scattered uptake in the axial skeleton indicating osseous metastasis at that time.  His exact Gleason score is unknown at that time.  He started on androgen deprivation therapy and Casodex which she has been receiving under the care of Dr. Tresa Moore.  He was hospitalized in December 2020 for atrial fibrillation and soon after his discharge she sustained a fall and had a fracture in the left distal femur and underwent open reduction internal fixation of left distal femur and intramedullary nailing of the left proximal femur.  The pathology from that bone specimen did not show any malignancy at that time.  He was discharged and currently receiving rehabilitation in [place].  Clinically, reports no major complaints at this time and does not report any bone pain or pathological fractures.  He is participating in physical therapy and reports improvement in his overall performance status.  He does not report any headaches, blurry vision, syncope or seizures. Does not report any fevers, chills or sweats.  Does not report any cough, wheezing or hemoptysis.  Does not report any chest pain, palpitation, orthopnea or leg edema.  Does not report any nausea, vomiting or abdominal pain.  Does not report any constipation or diarrhea.  Does not report any skeletal complaints.    Does not report  frequency, urgency or hematuria.  Does not report any skin rashes or lesions. Does not report any heat or cold intolerance.  Does not report any lymphadenopathy or petechiae.  Does not report any anxiety or depression.  Remaining review of systems is negative.    Past Medical History:  Diagnosis Date  . CVA (cerebral vascular accident) ([place]) 2010  . Diabetes mellitus with end-stage renal disease ([place])   . Dyslipidemia   . Essential hypertension   . Hypothyroidism (acquired)   . OSA on CPAP   :  Past Surgical History:  Procedure Laterality Date  . APPENDECTOMY    . FEMUR IM NAIL Left 05/20/2019   Procedure: Intramedullary (Im) Retrograde Femoral Nailing for impending pathologic fracture;  Surgeon: Altamese Orleans, MD;  Location: [facility];  Service: Orthopedics;  Laterality: Left;  . ORIF FEMUR FRACTURE Left 05/20/2019   Procedure: OPEN REDUCTION INTERNAL FIXATION (ORIF) DISTAL FEMUR FRACTURE with intercondylar extension;  Surgeon: Altamese Curlew, MD;  Location: [facility];  Service: Orthopedics;  Laterality: Left;  . TRANSURETHRAL RESECTION OF BLADDER TUMOR N/A 01/07/2019   Procedure: CYSTOSCOPY/ EVEACUATION CLOT;  Surgeon: Bjorn Loser, MD;  Location: [facility];  Service: Urology;  Laterality: N/A;  :   Current Outpatient Medications:  .  amitriptyline (ELAVIL) 100 MG tablet, Take 1 tablet (100 mg total) by mouth at bedtime., Disp: 14 tablet, Rfl: 0 .  apixaban (ELIQUIS) 5 MG TABS tablet, Take 1 tablet (5 mg total) by mouth 2 (two) times daily., Disp: 60 tablet, Rfl: 0 .  bicalutamide (CASODEX) 50  MG tablet, Take 1 tablet (50 mg total) by mouth daily., Disp: 14 tablet, Rfl:  .  calcitRIOL (ROCALTROL) 0.5 MCG capsule, Take 8 capsules (4 mcg total) by mouth 3 (three) times a week., Disp: 24 capsule, Rfl: 0 .  calcium carbonate (TUMS - DOSED IN MG ELEMENTAL CALCIUM) 500 MG chewable tablet, Chew 2 tablets (400 mg of elemental calcium total) by mouth 4 (four) times daily -  with meals and at  bedtime., Disp: 110 tablet, Rfl: 0 .  EUTHYROX 75 MCG tablet, Take 1 tablet (75 mcg total) by mouth every morning., Disp: 14 tablet, Rfl: 0 .  ferric citrate (AURYXIA) 1 GM 210 MG(Fe) tablet, Take 2 tablets (420 mg total) by mouth 3 (three) times daily with meals., Disp: 90 tablet, Rfl: 0 .  fluticasone (FLONASE ALLERGY RELIEF) 50 MCG/ACT nasal spray, Place 1 spray into both nostrils as needed., Disp:  , Rfl: 2 .  furosemide (LASIX) 80 MG tablet, Take 1 tablet (80 mg total) by mouth 4 (four) times a week. Mondays, wednesdays, fridays, and sundays, Disp: 15 tablet, Rfl:  .  glipiZIDE (GLUCOTROL) 5 MG tablet, Take 1 tablet (5 mg total) by mouth daily., Disp: 14 tablet, Rfl: 0 .  LEUPROLIDE ACETATE, 6 MONTH, 45 MG injection, Inject 45 mg into the skin every 6 (six) months., Disp: 1 each, Rfl: 1 .  lidocaine-prilocaine (EMLA) cream, Apply 1 application topically See admin instructions. Apply small amount to access site 1-2 hours before dialysis, cover with occlusive dressing (saran wrap), Disp: 30 g, Rfl: 0 .  LINZESS 72 MCG capsule, Take 1 capsule (72 mcg total) by mouth as needed., Disp: 14 capsule, Rfl: 0 .  metoprolol tartrate (LOPRESSOR) 50 MG tablet, Take 1 tablet (50 mg total) by mouth 2 (two) times daily., Disp: 28 tablet, Rfl: 0 .  polyethylene glycol (MIRALAX / GLYCOLAX) 17 g packet, Take 17 g by mouth daily., Disp: 14 each, Rfl: 0 .  senna (SENOKOT) 8.6 MG TABS tablet, Take 2 tablets (17.2 mg total) by mouth 2 (two) times daily., Disp: 120 tablet, Rfl: 0 .  sertraline (ZOLOFT) 50 MG tablet, Take 1 tablet (50 mg total) by mouth daily., Disp: 30 tablet, Rfl: 0:  Allergies  Allergen Reactions  . Gabapentin Swelling  :  No family history on file.:  Social History   Socioeconomic History  . Marital status: Single    Spouse name: Not on file  . Number of children: Not on file  . Years of education: Not on file  . Highest education level: Not on file  Occupational History  . Occupation:  retired  Tobacco Use  . Smoking status: Former Smoker    Quit date: 1997    Years since quitting: 24.0  . Smokeless tobacco: Never Used  Substance and Sexual Activity  . Alcohol use: Never  . Drug use: Never  . Sexual activity: Not on file  Other Topics Concern  . Not on file  Social History Narrative  . Not on file   Social Determinants of Health   Financial Resource Strain:   . Difficulty of Paying Living Expenses: Not on file  Food Insecurity:   . Worried About Charity fundraiser in the Last Year: Not on file  . Ran Out of Food in the Last Year: Not on file  Transportation Needs:   . Lack of Transportation (Medical): Not on file  . Lack of Transportation (Non-Medical): Not on file  Physical Activity:   . Days of Exercise  per Week: Not on file  . Minutes of Exercise per Session: Not on file  Stress:   . Feeling of Stress : Not on file  Social Connections:   . Frequency of Communication with Friends and Family: Not on file  . Frequency of Social Gatherings with Friends and Family: Not on file  . Attends Religious Services: Not on file  . Active Member of Clubs or Organizations: Not on file  . Attends Archivist Meetings: Not on file  . Marital Status: Not on file  Intimate Partner Violence:   . Fear of Current or Ex-Partner: Not on file  . Emotionally Abused: Not on file  . Physically Abused: Not on file  . Sexually Abused: Not on file  :  Pertinent items are noted in HPI.  Exam: Blood pressure 125/69, pulse 68, temperature 98.5 F (36.9 C), temperature source Temporal, resp. rate 17, SpO2 100 %.  ECOG 1 General appearance: alert and cooperative appeared without distress. Head: atraumatic without any abnormalities. Eyes: conjunctivae/corneas clear. PERRL.  Sclera anicteric. Throat: lips, mucosa, and tongue normal; without oral thrush or ulcers. Resp: clear to auscultation bilaterally without rhonchi, wheezes or dullness to percussion. Cardio: regular  rate and rhythm, S1, S2 normal, no murmur, click, rub or gallop GI: soft, non-tender; bowel sounds normal; no masses,  no organomegaly Skin: Skin color, texture, turgor normal. No rashes or lesions Lymph nodes: Cervical, supraclavicular, and axillary nodes normal. Neurologic: Grossly normal without any motor, sensory or deep tendon reflexes. Musculoskeletal: No joint deformity or effusion.  CBC    Component Value Date/Time   WBC 7.2 05/25/2019 0213   RBC 2.83 (L) 05/25/2019 0213   HGB 8.2 (L) 05/25/2019 0213   HCT 25.9 (L) 05/25/2019 0213   PLT 204 05/25/2019 0213   MCV 91.5 05/25/2019 0213   MCH 29.0 05/25/2019 0213   MCHC 31.7 05/25/2019 0213   RDW 18.0 (H) 05/25/2019 0213   LYMPHSABS 1.6 01/12/2019 0424   MONOABS 1.1 (H) 01/12/2019 0424   EOSABS 0.2 01/12/2019 0424   BASOSABS 0.0 01/12/2019 0424     Chemistry      Component Value Date/Time   NA 132 (L) 05/25/2019 0213   K 3.9 05/25/2019 0213   CL 92 (L) 05/25/2019 0213   CO2 29 05/25/2019 0213   BUN 46 (H) 05/25/2019 0213   CREATININE 4.31 (H) 05/25/2019 0213      Component Value Date/Time   CALCIUM 7.7 (L) 05/25/2019 0213   ALKPHOS 1,274 (H) 05/15/2019 1627   AST 21 05/15/2019 1627   ALT 16 05/15/2019 1627   BILITOT 0.8 05/15/2019 1627     Results for Richard Hall, Richard Hall (MRN [id number]) as of 06/21/2019 12:24  Ref. Range 01/06/2019 19:25  Prostatic Specific Antigen Latest Ref Range: 0.00 - 4.00 ng/mL 79.65 (H)    Assessment and Plan:   64 year old with:  1.  Advanced prostate cancer with disease to the bone diagnosed in November 2020.  He had a PSA of 79.65 with disease in the bone.  He is currently receiving androgen deprivation therapy with Casodex under the care of Dr. Tresa Moore.  The natural course of this disease was reviewed today and treatment options were discussed.  His disease is incurable and any treatment at this time is palliative which he understands at this time.  Androgen deprivation therapy remains the  main treatment option at this time with therapy escalation utilizing Zytiga, Xtandi as well as systemic chemotherapy would be an option.  I do not  see the role for systemic chemotherapy at this time given the fact that he does not have any visceral disease.  He understands that he is a risk of developing castration-resistant disease and alternative treatment options may be needed at that time which will be available to him here at the cancer center.  He is currently receiving appropriate treatment under the care of Dr. Tresa Moore and no additional treatment is recommended.  2.  Androgen deprivation: He will continue to receive that under the care of Dr. Tammi Klippel every 6 months.  He was treated last in December 2020.  3.  Bone directed therapy: I recommended consideration for Xgeva after obtaining dental clearance.  4.  Follow-up: I am happy to see him in the future as needed if he develops any issues with his prostate cancer beyond the scope of urology care.  60  minutes was spent on this encounter.  The time was dedicated to reviewing imaging studies, laboratory data, discussing treatment options as well as complications related therapy.    Thank you for the referral. A copy of this consult has been forwarded to the requesting physician.

## 2019-09-01 ENCOUNTER — Telehealth: Payer: Self-pay | Admitting: Radiation Oncology

## 2019-09-01 NOTE — Telephone Encounter (Signed)
Received voicemail message from [place] requesting foley catheter care orders from Dr. Tyler Pita. Phoned her back but no answer. This RN left a voicemail explaining Dr. Tyler Pita has never seen this patient. Verbalized that Dr. Alexis Frock is this patient's urologist according to the EMR and provided her the number to [facility]. Provided my direct number in the event she has further questions.

## 2019-10-18 ENCOUNTER — Ambulatory Visit: Payer: Medicaid Other | Admitting: Podiatry

## 2019-10-20 ENCOUNTER — Other Ambulatory Visit: Payer: Self-pay

## 2019-10-20 ENCOUNTER — Encounter: Payer: Self-pay | Admitting: Podiatry

## 2019-10-20 ENCOUNTER — Ambulatory Visit (INDEPENDENT_AMBULATORY_CARE_PROVIDER_SITE_OTHER): Payer: Medicare HMO | Admitting: Podiatry

## 2019-10-20 ENCOUNTER — Other Ambulatory Visit: Payer: Self-pay | Admitting: Internal Medicine

## 2019-10-20 DIAGNOSIS — E1142 Type 2 diabetes mellitus with diabetic polyneuropathy: Secondary | ICD-10-CM

## 2019-10-20 DIAGNOSIS — B351 Tinea unguium: Secondary | ICD-10-CM | POA: Diagnosis not present

## 2019-10-20 NOTE — Progress Notes (Signed)
Subjective: Richard Hall presents today at risk foot care with h/o NIDDM with ESRD on hemodialysis and painful mycotic nails b/l that are difficult to trim. Pain interferes with ambulation. Aggravating factors include wearing enclosed shoe gear. Pain is relieved with periodic professional debridement.  His dialysis days are MWF.   Patient states he broke his left femur in December, 2020 secondary to a fall. He was in rehab and is now back at home.  He did not check his sugar this morning.    [name], MD is patient's PCP.   Past Medical History:  Diagnosis Date  . CVA (cerebral vascular accident) ([place]) 2010  . Diabetes mellitus with end-stage renal disease ([place])   . Dyslipidemia   . Essential hypertension   . Hypothyroidism (acquired)   . OSA on CPAP      Current Outpatient Medications on File Prior to Visit  Medication Sig Dispense Refill  . amitriptyline (ELAVIL) 100 MG tablet Take 1 tablet (100 mg total) by mouth at bedtime. 14 tablet 0  . apixaban (ELIQUIS) 5 MG TABS tablet Take 1 tablet (5 mg total) by mouth 2 (two) times daily. 60 tablet 0  . bicalutamide (CASODEX) 50 MG tablet Take 1 tablet (50 mg total) by mouth daily. 14 tablet   . calcitRIOL (ROCALTROL) 0.5 MCG capsule Take 8 capsules (4 mcg total) by mouth 3 (three) times a week. 24 capsule 0  . calcium carbonate (TUMS - DOSED IN MG ELEMENTAL CALCIUM) 500 MG chewable tablet Chew 2 tablets (400 mg of elemental calcium total) by mouth 4 (four) times daily -  with meals and at bedtime. 110 tablet 0  . EUTHYROX 75 MCG tablet Take 1 tablet (75 mcg total) by mouth every morning. 14 tablet 0  . ferric citrate (AURYXIA) 1 GM 210 MG(Fe) tablet Take 2 tablets (420 mg total) by mouth 3 (three) times daily with meals. 90 tablet 0  . fluticasone (FLONASE ALLERGY RELIEF) 50 MCG/ACT nasal spray Place 1 spray into both nostrils as needed.  2  . furosemide (LASIX) 80 MG tablet Take 1 tablet (80 mg total) by mouth 4 (four) times a  week. Mondays, wednesdays, fridays, and sundays 15 tablet   . glipiZIDE (GLUCOTROL) 5 MG tablet Take 1 tablet (5 mg total) by mouth daily. 14 tablet 0  . LEUPROLIDE ACETATE, 6 MONTH, 45 MG injection Inject 45 mg into the skin every 6 (six) months. 1 each 1  . lidocaine-prilocaine (EMLA) cream Apply 1 application topically See admin instructions. Apply small amount to access site 1-2 hours before dialysis, cover with occlusive dressing (saran wrap) 30 g 0  . LINZESS 72 MCG capsule Take 1 capsule (72 mcg total) by mouth as needed. 14 capsule 0  . metoprolol tartrate (LOPRESSOR) 50 MG tablet Take 1 tablet (50 mg total) by mouth 2 (two) times daily. 28 tablet 0  . polyethylene glycol (MIRALAX / GLYCOLAX) 17 g packet Take 17 g by mouth daily. 14 each 0  . senna (SENOKOT) 8.6 MG TABS tablet Take 2 tablets (17.2 mg total) by mouth 2 (two) times daily. 120 tablet 0  . sertraline (ZOLOFT) 50 MG tablet Take 1 tablet (50 mg total) by mouth daily. 30 tablet 0   No current facility-administered medications on file prior to visit.     Allergies  Allergen Reactions  . Gabapentin Swelling    Objective: Richard Hall is a pleasant 64 y.o. y.o. Patient Race: White or Caucasian [1]  male in NAD. AAO x  3.  There were no vitals filed for this visit.  Vascular Examination: Capillary refill time to digits immediate b/l. Palpable DP pulses b/l. Palpable PT pulses b/l. Pedal hair absent b/l Skin temperature gradient within normal limits b/l. No edema noted b/l. No pain with calf compression b/l.  Dermatological Examination: Pedal skin with normal turgor, texture and tone bilaterally. No open wounds bilaterally. No interdigital macerations bilaterally. Toenails 1-5 b/l elongated, dystrophic, thickened, crumbly with subungual debris and tenderness to dorsal palpation.  Musculoskeletal: Normal muscle strength 5/5 to all lower extremity muscle groups bilaterally. No pain crepitus or joint limitation noted with ROM  b/l. Hammertoes noted to the 2-5 bilaterally. Pes planus deformity noted b/l.  Utilizes wheelchair for mobility assistance.  Neurological Examination: Protective sensation diminished with 10g monofilament b/l. Vibratory sensation intact b/l. Proprioception intact bilaterally.  Assessment: 1. Onychomycosis   2. DM type 2 with diabetic peripheral neuropathy ([place])    Plan: -Examined patient. -No new findings. No new orders. -Continue diabetic foot care principles. Literature dispensed on today.  -Toenails 1-5 b/l were debrided in length and girth with sterile nail nippers and dremel without iatrogenic bleeding.  -Patient to continue soft, supportive shoe gear daily. -Patient to report any pedal injuries to medical professional immediately. -He is currently nonweightbearing status per Orthopedics secondary to left femur fracture. -Patient/POA to call should there be question/concern in the interim.  No follow-ups on file.  Marzetta Board, DPM

## 2020-01-26 ENCOUNTER — Ambulatory Visit: Payer: Medicare HMO | Admitting: Podiatry

## 2020-02-28 ENCOUNTER — Other Ambulatory Visit: Payer: Self-pay | Admitting: Urology

## 2020-03-21 NOTE — Patient Instructions (Addendum)
DUE TO COVID-19 ONLY ONE VISITOR IS ALLOWED TO COME WITH YOU AND STAY IN THE WAITING ROOM ONLY DURING PRE OP AND PROCEDURE DAY OF SURGERY. THE 1 VISITOR  MAY VISIT WITH YOU AFTER SURGERY IN YOUR PRIVATE ROOM DURING VISITING HOURS ONLY!  YOU NEED TO HAVE A COVID 19 TEST ON: 03/27/20 @ 11:00 AM , THIS TEST MUST BE DONE BEFORE SURGERY,  COVID TESTING SITE [place] [address], IT IS ON THE RIGHT GOING OUT [address] APPROXIMATELY  2 MINUTES PAST [employer] ON THE RIGHT. ONCE YOUR COVID TEST IS COMPLETED,  PLEASE BEGIN THE QUARANTINE INSTRUCTIONS AS OUTLINED IN YOUR HANDOUT.                Richard Hall   Your procedure is scheduled on: 03/29/20   Report to [facility] Main  Entrance   Report to admitting at: 11:00 AM    Call this number if you have problems the morning of surgery [phone]    Remember: Do not eat solid food :After Midnight. Clear liquids from midnight until: 10:45 am.  CLEAR LIQUID DIET   Foods Allowed                                                                     Foods Excluded  Coffee and tea, regular and decaf                             liquids that you cannot  Plain Jell-O any favor except red or purple                                           see through such as: Fruit ices (not with fruit pulp)                                     milk, soups, orange juice  Iced Popsicles                                    All solid food Carbonated beverages, regular and diet                                    Cranberry, grape and apple juices Sports drinks like Gatorade Lightly seasoned clear broth or consume(fat free) Sugar, honey syrup  Sample Menu Breakfast                                Lunch                                     Supper Cranberry juice  Beef broth                            Chicken broth Jell-O                                     Grape juice                           Apple juice Coffee  or tea                        Jell-O                                      Popsicle                                                Coffee or tea                        Coffee or tea  _____________________________________________________________________  BRUSH YOUR TEETH MORNING OF SURGERY AND RINSE YOUR MOUTH OUT, NO CHEWING GUM CANDY OR MINTS.     Take these medicines the morning of surgery with A SIP OF WATER: metoprolol,sertraline,tamsulosin,Xtandi,euthyrox. Use flonase as usual.  How to Manage Your Diabetes Before and After Surgery  Why is it important to control my blood sugar before and after surgery? . Improving blood sugar levels before and after surgery helps healing and can limit problems. . A way of improving blood sugar control is eating a healthy diet by: o  Eating less sugar and carbohydrates o  Increasing activity/exercise o  Talking with your doctor about reaching your blood sugar goals . High blood sugars (greater than 180 mg/dL) can raise your risk of infections and slow your recovery, so you will need to focus on controlling your diabetes during the weeks before surgery. . Make sure that the doctor who takes care of your diabetes knows about your planned surgery including the date and location.  How do I manage my blood sugar before surgery? . Check your blood sugar at least 4 times a day, starting 2 days before surgery, to make sure that the level is not too high or low. o Check your blood sugar the morning of your surgery when you wake up and every 2 hours until you get to the Short Stay unit. . If your blood sugar is less than 70 mg/dL, you will need to treat for low blood sugar: o Do not take insulin. o Treat a low blood sugar (less than 70 mg/dL) with  cup of clear juice (cranberry or apple), 4 glucose tablets, OR glucose gel. o Recheck blood sugar in 15 minutes after treatment (to make sure it is greater than 70 mg/dL). If your blood sugar is not greater than 70  mg/dL on recheck, call [phone] for further instructions. . Report your blood sugar to the short stay nurse when you get to Short Stay.  . If you are admitted to the hospital after surgery: o Your blood sugar will be checked by the staff and you  will probably be given insulin after surgery (instead of oral diabetes medicines) to make sure you have good blood sugar levels. o The goal for blood sugar control after surgery is 80-180 mg/dL.   WHAT DO I DO ABOUT MY DIABETES MEDICATION?  Marland Kitchen Do not take oral diabetes medicines (pills) the morning of surgery.  . THE DAY BEFORE SURGERY, take Glipizide as usual in the morning.      . THE MORNING OF SURGERY: DO NOT TAKE ANY DIABETIC MEDICATIONS DAY OF YOUR SURGERY                               You may not have any metal on your body including hair pins and              piercings  Do not wear jewelry, lotions, powders or perfumes, deodorant             Men may shave face and neck.   Do not bring valuables to the hospital. [place].  Contacts, dentures or bridgework may not be worn into surgery.  Leave suitcase in the car. After surgery it may be brought to your room.     Patients discharged the day of surgery will not be allowed to drive home. IF YOU ARE HAVING SURGERY AND GOING HOME THE SAME DAY, YOU MUST HAVE AN ADULT TO DRIVE YOU HOME AND BE WITH YOU FOR 24 HOURS. YOU MAY GO HOME BY TAXI OR UBER OR ORTHERWISE, BUT AN ADULT MUST ACCOMPANY YOU HOME AND STAY WITH YOU FOR 24 HOURS.  Name and phone number of your driver:  Special Instructions: N/A              Please read over the following fact sheets you were given: _____________________________________________________________________          [facility] - Preparing for Surgery Before surgery, you can play an important role.  Because skin is not sterile, your skin needs to be as free of germs as possible.  You can reduce the number of germs  on your skin by washing with CHG (chlorahexidine gluconate) soap before surgery.  CHG is an antiseptic cleaner which kills germs and bonds with the skin to continue killing germs even after washing. Please DO NOT use if you have an allergy to CHG or antibacterial soaps.  If your skin becomes reddened/irritated stop using the CHG and inform your nurse when you arrive at Short Stay. Do not shave (including legs and underarms) for at least 48 hours prior to the first CHG shower.  You may shave your face/neck. Please follow these instructions carefully:  1.  Shower with CHG Soap the night before surgery and the  morning of Surgery.  2.  If you choose to wash your hair, wash your hair first as usual with your  normal  shampoo.  3.  After you shampoo, rinse your hair and body thoroughly to remove the  shampoo.                           4.  Use CHG as you would any other liquid soap.  You can apply chg directly  to the skin and wash  Gently with a scrungie or clean washcloth.  5.  Apply the CHG Soap to your body ONLY FROM THE NECK DOWN.   Do not use on face/ open                           Wound or open sores. Avoid contact with eyes, ears mouth and genitals (private parts).                       Wash face,  Genitals (private parts) with your normal soap.             6.  Wash thoroughly, paying special attention to the area where your surgery  will be performed.  7.  Thoroughly rinse your body with warm water from the neck down.  8.  DO NOT shower/wash with your normal soap after using and rinsing off  the CHG Soap.                9.  Pat yourself dry with a clean towel.            10.  Wear clean pajamas.            11.  Place clean sheets on your bed the night of your first shower and do not  sleep with pets. Day of Surgery : Do not apply any lotions/deodorants the morning of surgery.  Please wear clean clothes to the hospital/surgery center.  FAILURE TO FOLLOW THESE INSTRUCTIONS  MAY RESULT IN THE CANCELLATION OF YOUR SURGERY PATIENT SIGNATURE_________________________________  NURSE SIGNATURE__________________________________  ________________________________________________________________________

## 2020-03-22 ENCOUNTER — Encounter (HOSPITAL_COMMUNITY): Payer: Self-pay | Admitting: Physician Assistant

## 2020-03-22 ENCOUNTER — Other Ambulatory Visit: Payer: Self-pay

## 2020-03-22 ENCOUNTER — Encounter (HOSPITAL_COMMUNITY): Payer: Self-pay

## 2020-03-22 ENCOUNTER — Encounter (HOSPITAL_COMMUNITY)
Admission: RE | Admit: 2020-03-22 | Discharge: 2020-03-22 | Disposition: A | Discharge status: Home / Self Care | Payer: Medicare HMO | Source: Ambulatory Visit | Attending: Urology | Admitting: Urology

## 2020-03-22 DIAGNOSIS — R339 Retention of urine, unspecified: Secondary | ICD-10-CM | POA: Diagnosis not present

## 2020-03-22 DIAGNOSIS — N186 End stage renal disease: Secondary | ICD-10-CM | POA: Insufficient documentation

## 2020-03-22 DIAGNOSIS — Z01818 Encounter for other preprocedural examination: Secondary | ICD-10-CM | POA: Insufficient documentation

## 2020-03-22 DIAGNOSIS — E039 Hypothyroidism, unspecified: Secondary | ICD-10-CM | POA: Diagnosis not present

## 2020-03-22 DIAGNOSIS — E785 Hyperlipidemia, unspecified: Secondary | ICD-10-CM | POA: Insufficient documentation

## 2020-03-22 DIAGNOSIS — Z7984 Long term (current) use of oral hypoglycemic drugs: Secondary | ICD-10-CM | POA: Diagnosis not present

## 2020-03-22 DIAGNOSIS — I120 Hypertensive chronic kidney disease with stage 5 chronic kidney disease or end stage renal disease: Secondary | ICD-10-CM | POA: Diagnosis not present

## 2020-03-22 DIAGNOSIS — Z87891 Personal history of nicotine dependence: Secondary | ICD-10-CM | POA: Insufficient documentation

## 2020-03-22 DIAGNOSIS — E1122 Type 2 diabetes mellitus with diabetic chronic kidney disease: Secondary | ICD-10-CM | POA: Diagnosis not present

## 2020-03-22 DIAGNOSIS — Z7901 Long term (current) use of anticoagulants: Secondary | ICD-10-CM | POA: Diagnosis not present

## 2020-03-22 DIAGNOSIS — Z79899 Other long term (current) drug therapy: Secondary | ICD-10-CM | POA: Diagnosis not present

## 2020-03-22 DIAGNOSIS — Z8673 Personal history of transient ischemic attack (TIA), and cerebral infarction without residual deficits: Secondary | ICD-10-CM | POA: Diagnosis not present

## 2020-03-22 HISTORY — DX: Cardiac arrhythmia, unspecified: I49.9

## 2020-03-22 HISTORY — DX: Malignant (primary) neoplasm, unspecified: C80.1

## 2020-03-22 LAB — GLUCOSE, CAPILLARY: Glucose-Capillary: 535 mg/dL (ref 70–99)

## 2020-03-22 NOTE — Progress Notes (Signed)
Pt. Called to report that his CBG at home is 266.RN recommended pt. To continue checking his blood sugars at home,at least twice a day until it gets stable.Also to go to the ED if it's too high.

## 2020-03-22 NOTE — Progress Notes (Signed)
COVID Vaccine Completed: Yes Date COVID Vaccine completed: 02/29/20: Jorje Guild. COVID vaccine manufacturer: Pfizer    PCP - Dr. Loletta Parish. LOV: 01/05/20 Cardiologist - Dr. Helene Kelp. LOV: 04/12/18  Chest x-ray -  EKG - 05/14/19. EPIC Stress Test -  ECHO -  Cardiac Cath -  Pacemaker/ICD device last checked: A1-C: 9.9: 03/14/20. CEW. Sleep Study - Yes CPAP - Yes  Fasting Blood Sugar - 160's - 200's Checks Blood Sugar ___2__ times a week  Blood Thinner Instructions:Eliquis will be on hold 48 hrs. Before surgery as per Dr. Zettie Pho instructions. Aspirin Instructions: Last Dose:  Anesthesia review: Hx: DIA,HTN,CVA,OSA(CPAP),HD(M-W-F). Pt's CBG on PST appointment was: 535.  Patient denies shortness of breath, fever, cough and chest pain at PAT appointment   Patient verbalized understanding of instructions that were given to them at the PAT appointment. Patient was also instructed that they will need to review over the PAT instructions again at home before surgery.

## 2020-03-23 NOTE — Progress Notes (Addendum)
Anesthesia Chart Review   Case: [id number] Date/Time: 03/29/20 1345   Procedure: TRANSURETHRAL RESECTION OF THE PROSTATE (TURP) (N/A ) - 75 MINS   Anesthesia type: Choice   Pre-op diagnosis: URINARY RETENTION   Location: [facility]   Surgeons: Alexis Frock, MD      DISCUSSION:64 y.o. former smoker (quit 06/03/95) with h/o OSA on CPAP, HTN, PAF (on Eliquis), poorly controlled DM II, CVA 2010, ESRD on dialysis MWF, prostate cancer with osseous metastasis, urinary retention scheduled for above procedure 03/29/2020 with Dr. Alexis Frock.    Pt with glucose of 535 at PAT visit.  He reports he has not taken DM medications today and frequently has readings in the 500s before taking his medication.  Pt defers immediate evaluation, went home to take medicine after visit and reports glucose of 266.  I contacted PCP who made him an appointment for Monday 03/26/20.  PCP reports pt was previously seen by endocrinologist, but has not followed up in over a year.  Dr. Zettie Pho scheduler made aware of the need for better DM control prior to procedure and risk of cancellation DOS.    Pt reports he was advised to hold Eliquis 48 hours prior to procedure.  He has not been seen by cardiology since 2019.  Negative stress test 10/13/2017.  Echo 10/15/2017 with EF > 55%, mild mitral regurgitation, moderate tricuspid regurgitation.   Discussed with Dr. Lissa Hoard who recommends cardiac evaluation as well.  Dr. Zettie Pho office made aware.   VS: BP (!) 190/77   Pulse 72   Temp 36.8 C   Resp 18   Ht 6' (1.829 m)   Wt 122.9 kg   SpO2 96%   BMI 36.75 kg/m   PROVIDERS: [name], MD is PCP   Helene Kelp, MD is Cardiologist  LABS: Labs reviewed: Acceptable for surgery. (all labs ordered are listed, but only abnormal results are displayed)  Labs Reviewed  GLUCOSE, CAPILLARY - Abnormal; Notable for the following components:      Result Value   Glucose-Capillary 535 (*)    All other components  within normal limits     IMAGES:   EKG:   CV: Echo 10/15/2017  Interpretation Summary  A complete two-dimensional transthoracic echocardiogram was performed (2D,  M-mode, Doppler and color flow Doppler).  There is moderate concentric left ventricular hypertrophy.  Ejection Fraction = >55%.  The transmitral spectral Doppler flow pattern is suggestive of impaired LV  relaxation.  There is mild mitral regurgitation.  There is mild to moderate tricuspid regurgitation.   Stress Test 10/13/2017 NUCLEAR INTERPRETATION:  For the resting examination patient received total of 11 mCi radioactive technetium labeled sestamibi followed by SPECT imaging. For the stress examination patient underwent Lexiscan as per [place] and received total of 33 mCi radioactive technetium  labeled sestamibi followed by gated SPECT imaging.   There is no significant abnormality of the left ventricular wall motion or wall thickness noted with post-stress resting left ventricular ejection fraction of 54%.  There are no myocardial perfusion defects seen during stress or resting images. Mild diaphragmatic attenuation noted.   CONCLUSIONS:  This study is negative for myocardial ischemia with normal LVEF.   Past Medical History:  Diagnosis Date  . Cancer [facility])    prostate  . CVA (cerebral vascular accident) ([place]) 2010  . Diabetes mellitus with end-stage renal disease ([place])    Dyalisis pt.  . Dyslipidemia   . Dysrhythmia    A-fib  . Essential  hypertension   . Hypothyroidism (acquired)   . OSA on CPAP     Past Surgical History:  Procedure Laterality Date  . APPENDECTOMY    . FEMUR IM NAIL Left 05/20/2019   Procedure: Intramedullary (Im) Retrograde Femoral Nailing for impending pathologic fracture;  Surgeon: Altamese Gaylord, MD;  Location: [place];  Service: Orthopedics;  Laterality: Left;  . ORIF FEMUR FRACTURE Left 05/20/2019   Procedure: OPEN REDUCTION INTERNAL FIXATION (ORIF) DISTAL FEMUR FRACTURE with  intercondylar extension;  Surgeon: Altamese Orchard Mesa, MD;  Location: [place];  Service: Orthopedics;  Laterality: Left;  . TRANSURETHRAL RESECTION OF BLADDER TUMOR N/A 01/07/2019   Procedure: CYSTOSCOPY/ EVEACUATION CLOT;  Surgeon: Bjorn Loser, MD;  [facility];  Service: Urology;  Laterality: N/A;    MEDICATIONS: . acetaminophen (TYLENOL) 500 MG tablet  . amitriptyline (ELAVIL) 100 MG tablet  . apixaban (ELIQUIS) 5 MG TABS tablet  . atorvastatin (LIPITOR) 40 MG tablet  . EUTHYROX 88 MCG tablet  . ferric citrate (AURYXIA) 1 GM 210 MG(Fe) tablet  . fluticasone (FLONASE ALLERGY RELIEF) 50 MCG/ACT nasal spray  . furosemide (LASIX) 80 MG tablet  . glipiZIDE (GLUCOTROL) 10 MG tablet  . LEUPROLIDE ACETATE, 6 MONTH, 45 MG injection  . lidocaine-prilocaine (EMLA) cream  . LINZESS 72 MCG capsule  . metoprolol tartrate (LOPRESSOR) 25 MG tablet  . sertraline (ZOLOFT) 100 MG tablet  . tamsulosin (FLOMAX) 0.4 MG CAPS capsule  . XTANDI 40 MG capsule   No current facility-administered medications for this encounter.     [name], PA-C [facility] [phone]

## 2020-03-27 ENCOUNTER — Inpatient Hospital Stay (HOSPITAL_COMMUNITY): Admission: RE | Admit: 2020-03-27 | Payer: Medicare HMO | Source: Ambulatory Visit

## 2020-03-29 ENCOUNTER — Encounter (HOSPITAL_COMMUNITY): Admission: RE | Payer: Self-pay | Source: Ambulatory Visit

## 2020-03-29 ENCOUNTER — Ambulatory Visit (HOSPITAL_COMMUNITY): Admission: RE | Admit: 2020-03-29 | Payer: Medicare HMO | Source: Ambulatory Visit | Admitting: Urology

## 2020-03-29 SURGERY — TURP (TRANSURETHRAL RESECTION OF PROSTATE)
Anesthesia: Choice

## 2020-04-12 ENCOUNTER — Ambulatory Visit: Payer: Medicare HMO | Admitting: Podiatry

## 2020-06-28 ENCOUNTER — Ambulatory Visit: Payer: Medicare HMO | Admitting: Podiatry

## 2020-07-10 ENCOUNTER — Other Ambulatory Visit: Payer: Self-pay | Admitting: Urology

## 2020-07-13 NOTE — Progress Notes (Addendum)
COVID Vaccine Completed: Yes Date COVID Vaccine completed: x2 plus booster COVID vaccine manufacturer: Pfizer    Moderna     Date of COVID positive in last 90 days: No  PCP - [name], MD Cardiologist - Lars Pinks, MD preop exam 04/10/2020 in care everywhere  Chest x-ray -  Greater than 1 year in epic EKG - 11/4/21in care everywhere, requested and on chart Stress Test - Greater than 2 year  ECHO - 04/10/20 in care everywhere Cardiac Cath - N/A Pacemaker/ICD device last checked: N/A  Sleep Study - Yes CPAP - Yes  Fasting Blood Sugar -120's-200's Checks Blood Sugar __4-5___ times a day  Blood Thinner Instructions: Eliquis last dose 07/23/20 Aspirin Instructions: N/A Last Dose: N/A  Activity level:  Can go up a flight of stairs and activities of daily living without stopping and without symptoms       Anesthesia review: Afib, Valvular heart disease, CVA, OSA, HTN, DM, Dialysis  Patient denies shortness of breath, fever, cough and chest pain at PAT appointment   Patient verbalized understanding of instructions that were given to them at the PAT appointment. Patient was also instructed that they will need to review over the PAT instructions again at home before surgery.

## 2020-07-24 ENCOUNTER — Other Ambulatory Visit (HOSPITAL_COMMUNITY)
Admission: RE | Admit: 2020-07-24 | Discharge: 2020-07-24 | Disposition: A | Discharge status: Home / Self Care | Payer: Medicare Other | Source: Ambulatory Visit | Attending: Urology | Admitting: Urology

## 2020-07-24 ENCOUNTER — Encounter (HOSPITAL_COMMUNITY): Payer: Self-pay | Admitting: Urology

## 2020-07-24 ENCOUNTER — Other Ambulatory Visit: Payer: Self-pay

## 2020-07-24 DIAGNOSIS — Z01812 Encounter for preprocedural laboratory examination: Secondary | ICD-10-CM | POA: Insufficient documentation

## 2020-07-24 DIAGNOSIS — Z20822 Contact with and (suspected) exposure to covid-19: Secondary | ICD-10-CM | POA: Insufficient documentation

## 2020-07-24 LAB — SARS CORONAVIRUS 2 (TAT 6-24 HRS): SARS Coronavirus 2: NEGATIVE

## 2020-07-24 NOTE — Progress Notes (Signed)
Anesthesia Chart Review   Case: [id number] Date/Time: 07/26/20 1215   Procedure: TRANSURETHRAL RESECTION OF THE PROSTATE (TURP) (N/A ) - 75 MINS   Anesthesia type: Choice   Pre-op diagnosis: URINARY RETENTION   Location: [facility] [facility]   Surgeons: Alexis Frock, MD      DISCUSSION:64 y.o. former smoker (quit 06/03/95) with h/o OSA on CPAP, HTN, PAF (on Eliquis, last dose 07/23/20), poorly controlled DM II, CVA 2010, ESRD on dialysis MWF, OSA, prostate cancer with osseous metastasis, urinary retention scheduled for above procedure 07/26/2020 with Dr. Alexis Frock.   See previous anesthesia chart review note 03/22/2020.  Previously rescheduled due to poorly controlled DM and need for cardiac evaluation.   Pt last seen by PCP 07/12/2020. Per OV note pt was restarted on basal insulin with Toujeo. Will evaluate DOS.    Seen by cardiology 04/05/2020 for preoperative evaluation.  Per OV note Echo ordered. Echo 11/092021 with EF 65-70%, mild mitral valve regurgitation.   Clearance from cardiology received which states pt is stable fur surgery, low risk for cardiovascular event; on chart.  VS: Ht 6' (1.829 m)   Wt 121.6 kg   BMI 36.35 kg/m   PROVIDERS: [name], MD  Helene Kelp, MD is Cardiologist  LABS: labs DOS, SDW (all labs ordered are listed, but only abnormal results are displayed)  Labs Reviewed - No data to display   IMAGES:   EKG: On chart   CV: Echo 04/10/2020 LeftVentricle: Systolic function is normal. EF: 65-70%.  . LeftVentricle: Doppler parameters consistent with mild diastolic  dysfunction and low to normal LA pressure.  . MitralValve: There is mild regurgitation.  Past Medical History:  Diagnosis Date  . A-V fistula (H[facility])    left arm  . Anemia   . Arthritis   . CHF (congestive heart failure) ([place])   . CVA (cerebral vascular accident) ([place]) 2010  . Diabetes mellitus with end-stage renal disease ([place])    Dyalisis pt.  . Diabetic  neuropathy (H[facility])    Bilateral feet  . Dyslipidemia   . Dysrhythmia    A-fib  . Essential hypertension   . Hepatitis    childhood  . History of blood transfusion   . Hypothyroidism (acquired)   . OSA on CPAP   . Prostate cancer ([place])   . Uses wheelchair     Past Surgical History:  Procedure Laterality Date  . APPENDECTOMY    . COLONOSCOPY    . FEMUR IM NAIL Left 05/20/2019   Procedure: Intramedullary (Im) Retrograde Femoral Nailing for impending pathologic fracture;  Surgeon: Altamese Masthope, MD;  Location: [place];  Service: Orthopedics;  Laterality: Left;  . ORIF FEMUR FRACTURE Left 05/20/2019   Procedure: OPEN REDUCTION INTERNAL FIXATION (ORIF) DISTAL FEMUR FRACTURE with intercondylar extension;  Surgeon: Altamese Verdon, MD;  Location: [place];  Service: Orthopedics;  Laterality: Left;  . TRANSURETHRAL RESECTION OF BLADDER TUMOR N/A 01/07/2019   Procedure: CYSTOSCOPY/ EVEACUATION CLOT;  Surgeon: Bjorn Loser, MD;  [facility];  Service: Urology;  Laterality: N/A;    MEDICATIONS: No current facility-administered medications for this encounter.   Marland Kitchen acetaminophen (TYLENOL) 500 MG tablet  . amitriptyline (ELAVIL) 100 MG tablet  . apixaban (ELIQUIS) 2.5 MG TABS tablet  . atorvastatin (LIPITOR) 80 MG tablet  . EUTHYROX 88 MCG tablet  . ferric citrate (AURYXIA) 1 GM 210 MG(Fe) tablet  . fluticasone (FLONASE ALLERGY RELIEF) 50 MCG/ACT nasal spray  . furosemide (LASIX) 80 MG tablet  .  glipiZIDE (GLUCOTROL) 10 MG tablet  . insulin glargine, 1 Unit Dial, (TOUJEO SOLOSTAR) 300 UNIT/ML Solostar Pen  . LEUPROLIDE ACETATE, 6 MONTH, 45 MG injection  . lidocaine-prilocaine (EMLA) cream  . LINZESS 72 MCG capsule  . metoprolol tartrate (LOPRESSOR) 25 MG tablet  . sertraline (ZOLOFT) 100 MG tablet  . tamsulosin (FLOMAX) 0.4 MG CAPS capsule  . XTANDI 40 MG capsule    [name], PA-C [facility] [phone]

## 2020-07-25 NOTE — Anesthesia Preprocedure Evaluation (Addendum)
Anesthesia Evaluation  Patient identified by MRN, date of birth, ID band Patient awake    Reviewed: Allergy & Precautions, H&P , NPO status , Patient's Chart, lab work & pertinent test results  Airway Mallampati: II  TM Distance: >3 FB Neck ROM: Full    Dental no notable dental hx.    Pulmonary sleep apnea and Continuous Positive Airway Pressure Ventilation , former smoker,    Pulmonary exam normal breath sounds clear to auscultation       Cardiovascular hypertension, Normal cardiovascular exam+ dysrhythmias Atrial Fibrillation  Rhythm:Regular Rate:Normal     Neuro/Psych CVA negative psych ROS   GI/Hepatic negative GI ROS, Neg liver ROS,   Endo/Other  diabetesHypothyroidism   Renal/GU DialysisRenal diseaseMetastatic prostate cancer  negative genitourinary   Musculoskeletal negative musculoskeletal ROS (+)   Abdominal   Peds negative pediatric ROS (+)  Hematology negative hematology ROS (+)   Anesthesia Other Findings   Reproductive/Obstetrics negative OB ROS                           Anesthesia Physical Anesthesia Plan  ASA: IV  Anesthesia Plan: General   Post-op Pain Management:    Induction: Intravenous  PONV Risk Score and Plan: 2 and Ondansetron, Dexamethasone and Treatment may vary due to age or medical condition  Airway Management Planned: LMA  Additional Equipment:   Intra-op Plan:   Post-operative Plan: Extubation in OR  Informed Consent: I have reviewed the patients History and Physical, chart, labs and discussed the procedure including the risks, benefits and alternatives for the proposed anesthesia with the patient or authorized representative who has indicated his/her understanding and acceptance.     Dental advisory given  Plan Discussed with: CRNA and Surgeon  Anesthesia Plan Comments: (See PAT note 07/24/20, [name], PA-C)       Anesthesia Quick  Evaluation

## 2020-07-26 ENCOUNTER — Observation Stay (HOSPITAL_COMMUNITY)
Admission: RE | Admit: 2020-07-26 | Discharge: 2020-07-27 | Disposition: A | Discharge status: Home / Self Care | Payer: Medicare Other | Attending: Urology | Admitting: Urology

## 2020-07-26 ENCOUNTER — Ambulatory Visit: Payer: Medicare Other | Admitting: Podiatry

## 2020-07-26 ENCOUNTER — Encounter (HOSPITAL_COMMUNITY): Payer: Self-pay | Admitting: Urology

## 2020-07-26 ENCOUNTER — Ambulatory Visit (HOSPITAL_COMMUNITY): Payer: Medicare Other | Admitting: Physician Assistant

## 2020-07-26 ENCOUNTER — Other Ambulatory Visit: Payer: Self-pay

## 2020-07-26 ENCOUNTER — Encounter (HOSPITAL_COMMUNITY)
Admission: RE | Disposition: A | Discharge status: Home / Self Care | Payer: Self-pay | Source: Home / Self Care | Attending: Urology

## 2020-07-26 DIAGNOSIS — Z87891 Personal history of nicotine dependence: Secondary | ICD-10-CM | POA: Insufficient documentation

## 2020-07-26 DIAGNOSIS — N186 End stage renal disease: Secondary | ICD-10-CM | POA: Insufficient documentation

## 2020-07-26 DIAGNOSIS — C7951 Secondary malignant neoplasm of bone: Secondary | ICD-10-CM | POA: Insufficient documentation

## 2020-07-26 DIAGNOSIS — E039 Hypothyroidism, unspecified: Secondary | ICD-10-CM | POA: Diagnosis not present

## 2020-07-26 DIAGNOSIS — I132 Hypertensive heart and chronic kidney disease with heart failure and with stage 5 chronic kidney disease, or end stage renal disease: Secondary | ICD-10-CM | POA: Insufficient documentation

## 2020-07-26 DIAGNOSIS — E1122 Type 2 diabetes mellitus with diabetic chronic kidney disease: Secondary | ICD-10-CM | POA: Insufficient documentation

## 2020-07-26 DIAGNOSIS — I509 Heart failure, unspecified: Secondary | ICD-10-CM | POA: Insufficient documentation

## 2020-07-26 DIAGNOSIS — R339 Retention of urine, unspecified: Secondary | ICD-10-CM | POA: Diagnosis present

## 2020-07-26 DIAGNOSIS — C61 Malignant neoplasm of prostate: Principal | ICD-10-CM | POA: Insufficient documentation

## 2020-07-26 DIAGNOSIS — R338 Other retention of urine: Secondary | ICD-10-CM | POA: Insufficient documentation

## 2020-07-26 HISTORY — DX: Personal history of other medical treatment: Z92.89

## 2020-07-26 HISTORY — DX: Inflammatory liver disease, unspecified: K75.9

## 2020-07-26 HISTORY — DX: Anemia, unspecified: D64.9

## 2020-07-26 HISTORY — DX: Dependence on wheelchair: Z99.3

## 2020-07-26 HISTORY — DX: Arteriovenous fistula, acquired: I77.0

## 2020-07-26 HISTORY — DX: Unspecified osteoarthritis, unspecified site: M19.90

## 2020-07-26 HISTORY — DX: Type 2 diabetes mellitus with diabetic neuropathy, unspecified: E11.40

## 2020-07-26 HISTORY — DX: Heart failure, unspecified: I50.9

## 2020-07-26 HISTORY — DX: Malignant neoplasm of prostate: C61

## 2020-07-26 HISTORY — PX: TRANSURETHRAL RESECTION OF PROSTATE: SHX73

## 2020-07-26 LAB — CBC
HCT: 30.8 % — ABNORMAL LOW (ref 39.0–52.0)
Hemoglobin: 9.7 g/dL — ABNORMAL LOW (ref 13.0–17.0)
MCH: 30.9 pg (ref 26.0–34.0)
MCHC: 31.5 g/dL (ref 30.0–36.0)
MCV: 98.1 fL (ref 80.0–100.0)
Platelets: 247 10*3/uL (ref 150–400)
RBC: 3.14 MIL/uL — ABNORMAL LOW (ref 4.22–5.81)
RDW: 14.6 % (ref 11.5–15.5)
WBC: 9.1 10*3/uL (ref 4.0–10.5)
nRBC: 0 % (ref 0.0–0.2)

## 2020-07-26 LAB — COMPREHENSIVE METABOLIC PANEL
ALT: 11 U/L (ref 0–44)
AST: 17 U/L (ref 15–41)
Albumin: 3.7 g/dL (ref 3.5–5.0)
Alkaline Phosphatase: 71 U/L (ref 38–126)
Anion gap: 13 (ref 5–15)
BUN: 94 mg/dL — ABNORMAL HIGH (ref 8–23)
CO2: 26 mmol/L (ref 22–32)
Calcium: 9.6 mg/dL (ref 8.9–10.3)
Chloride: 95 mmol/L — ABNORMAL LOW (ref 98–111)
Creatinine, Ser: 5.96 mg/dL — ABNORMAL HIGH (ref 0.61–1.24)
GFR, Estimated: 10 mL/min — ABNORMAL LOW (ref >60)
Glucose, Bld: 273 mg/dL — ABNORMAL HIGH (ref 70–99)
Potassium: 3.9 mmol/L (ref 3.5–5.1)
Sodium: 134 mmol/L — ABNORMAL LOW (ref 135–145)
Total Bilirubin: 0.6 mg/dL (ref 0.3–1.2)
Total Protein: 7.7 g/dL (ref 6.5–8.1)

## 2020-07-26 LAB — GLUCOSE, CAPILLARY
Glucose-Capillary: 253 mg/dL — ABNORMAL HIGH (ref 70–99)
Glucose-Capillary: 215 mg/dL — ABNORMAL HIGH (ref 70–99)
Glucose-Capillary: 199 mg/dL — ABNORMAL HIGH (ref 70–99)
Glucose-Capillary: 242 mg/dL — ABNORMAL HIGH (ref 70–99)
Glucose-Capillary: 286 mg/dL — ABNORMAL HIGH (ref 70–99)

## 2020-07-26 LAB — HEMOGLOBIN A1C
Hgb A1c MFr Bld: 10.1 % — ABNORMAL HIGH (ref 4.8–5.6)
Mean Plasma Glucose: 243.17 mg/dL

## 2020-07-26 SURGERY — TURP (TRANSURETHRAL RESECTION OF PROSTATE)
Anesthesia: General

## 2020-07-26 MED ORDER — FENTANYL CITRATE (PF) 100 MCG/2ML IJ SOLN
25.0000 ug | INTRAMUSCULAR | Status: DC | PRN
  Administered 2020-07-26: 50 ug via INTRAVENOUS

## 2020-07-26 MED ORDER — FERRIC CITRATE 1 GM 210 MG(FE) PO TABS
420.0000 mg | ORAL_TABLET | Freq: Two times a day (BID) | ORAL | Status: DC
  Administered 2020-07-26 – 2020-07-27 (×2): 420 mg via ORAL
  Filled 2020-07-26 (×2): qty 2

## 2020-07-26 MED ORDER — OXYCODONE HCL 5 MG/5ML PO SOLN: 5.0000 mg | Freq: Once | ORAL | Status: DC | PRN

## 2020-07-26 MED ORDER — SERTRALINE HCL 100 MG PO TABS
100.0000 mg | ORAL_TABLET | Freq: Every day | ORAL | Status: DC
  Administered 2020-07-27: 100 mg via ORAL
  Filled 2020-07-26: qty 1

## 2020-07-26 MED ORDER — PROPOFOL 10 MG/ML IV BOLUS
INTRAVENOUS | Status: DC | PRN
  Administered 2020-07-26: 150 mg via INTRAVENOUS

## 2020-07-26 MED ORDER — SENNOSIDES-DOCUSATE SODIUM 8.6-50 MG PO TABS
1.0000 | ORAL_TABLET | Freq: Two times a day (BID) | ORAL | Status: DC
  Administered 2020-07-26 – 2020-07-27 (×2): 1 via ORAL
  Filled 2020-07-26 (×2): qty 1

## 2020-07-26 MED ORDER — LEVOTHYROXINE SODIUM 88 MCG PO TABS
88.0000 ug | ORAL_TABLET | Freq: Every day | ORAL | Status: DC
  Administered 2020-07-27: 88 ug via ORAL
  Filled 2020-07-26: qty 1

## 2020-07-26 MED ORDER — FENTANYL CITRATE (PF) 100 MCG/2ML IJ SOLN
INTRAMUSCULAR | Status: DC | PRN
  Administered 2020-07-26: 25 ug via INTRAVENOUS
  Administered 2020-07-26: 50 ug via INTRAVENOUS

## 2020-07-26 MED ORDER — DEXAMETHASONE SODIUM PHOSPHATE 10 MG/ML IJ SOLN
INTRAMUSCULAR | Status: DC | PRN
  Administered 2020-07-26: 5 mg via INTRAVENOUS

## 2020-07-26 MED ORDER — SODIUM CHLORIDE 0.9 % IR SOLN
Status: DC | PRN
  Administered 2020-07-26 (×3): 3000 mL

## 2020-07-26 MED ORDER — ONDANSETRON HCL 4 MG/2ML IJ SOLN: 4.0000 mg | Freq: Once | INTRAMUSCULAR | Status: DC | PRN

## 2020-07-26 MED ORDER — SODIUM CHLORIDE 0.9 % IV SOLN: INTRAVENOUS | Status: DC | PRN

## 2020-07-26 MED ORDER — SODIUM CHLORIDE 0.9 % IV SOLN
2.0000 g | INTRAVENOUS | Status: AC
  Administered 2020-07-26: 2 g via INTRAVENOUS
  Filled 2020-07-26: qty 20

## 2020-07-26 MED ORDER — INSULIN ASPART 100 UNIT/ML ~~LOC~~ SOLN
0.0000 [IU] | Freq: Three times a day (TID) | SUBCUTANEOUS | Status: DC
  Administered 2020-07-26: 5 [IU] via SUBCUTANEOUS

## 2020-07-26 MED ORDER — OXYCODONE HCL 5 MG PO TABS: 5.0000 mg | ORAL_TABLET | ORAL | Status: DC | PRN

## 2020-07-26 MED ORDER — OXYCODONE HCL 5 MG PO TABS: 5.0000 mg | ORAL_TABLET | Freq: Once | ORAL | Status: DC | PRN

## 2020-07-26 MED ORDER — CHLORHEXIDINE GLUCONATE 0.12 % MT SOLN: 15.0000 mL | Freq: Once | OROMUCOSAL | Status: DC

## 2020-07-26 MED ORDER — PHENYLEPHRINE 40 MCG/ML (10ML) SYRINGE FOR IV PUSH (FOR BLOOD PRESSURE SUPPORT)
PREFILLED_SYRINGE | INTRAVENOUS | Status: DC | PRN
  Administered 2020-07-26 (×2): 80 ug via INTRAVENOUS

## 2020-07-26 MED ORDER — FENTANYL CITRATE (PF) 100 MCG/2ML IJ SOLN
INTRAMUSCULAR | Status: AC
  Filled 2020-07-26: qty 2

## 2020-07-26 MED ORDER — LACTATED RINGERS IV SOLN: INTRAVENOUS | Status: DC

## 2020-07-26 MED ORDER — CHLORHEXIDINE GLUCONATE 0.12 % MT SOLN
15.0000 mL | Freq: Once | OROMUCOSAL | Status: AC
  Administered 2020-07-26: 15 mL via OROMUCOSAL

## 2020-07-26 MED ORDER — LIDOCAINE 2% (20 MG/ML) 5 ML SYRINGE
INTRAMUSCULAR | Status: DC | PRN
  Administered 2020-07-26: 100 mg via INTRAVENOUS

## 2020-07-26 MED ORDER — HYDROMORPHONE HCL 1 MG/ML IJ SOLN: 0.5000 mg | INTRAMUSCULAR | Status: DC | PRN

## 2020-07-26 MED ORDER — PROPOFOL 10 MG/ML IV BOLUS
INTRAVENOUS | Status: AC
  Filled 2020-07-26: qty 20

## 2020-07-26 MED ORDER — ONDANSETRON HCL 4 MG/2ML IJ SOLN
INTRAMUSCULAR | Status: AC
  Filled 2020-07-26: qty 2

## 2020-07-26 MED ORDER — SODIUM CHLORIDE 0.9 % IR SOLN
3000.0000 mL | Status: DC
  Administered 2020-07-26 – 2020-07-27 (×3): 3000 mL

## 2020-07-26 MED ORDER — INSULIN ASPART 100 UNIT/ML ~~LOC~~ SOLN
6.0000 [IU] | SUBCUTANEOUS | Status: AC
  Administered 2020-07-26: 6 [IU] via SUBCUTANEOUS

## 2020-07-26 MED ORDER — INSULIN GLARGINE 100 UNIT/ML ~~LOC~~ SOLN
10.0000 [IU] | Freq: Every day | SUBCUTANEOUS | Status: DC
  Administered 2020-07-26: 10 [IU] via SUBCUTANEOUS
  Filled 2020-07-26: qty 0.1

## 2020-07-26 MED ORDER — SODIUM CHLORIDE 0.9 % IV SOLN: Freq: Once | INTRAVENOUS | Status: AC

## 2020-07-26 MED ORDER — SODIUM CHLORIDE 0.9 % IV SOLN: INTRAVENOUS | Status: DC

## 2020-07-26 MED ORDER — ORAL CARE MOUTH RINSE: 15.0000 mL | Freq: Once | OROMUCOSAL | Status: DC

## 2020-07-26 MED ORDER — ATORVASTATIN CALCIUM 40 MG PO TABS
80.0000 mg | ORAL_TABLET | Freq: Every day | ORAL | Status: DC
  Administered 2020-07-26 – 2020-07-27 (×2): 80 mg via ORAL
  Filled 2020-07-26 (×2): qty 2

## 2020-07-26 MED ORDER — GLYCOPYRROLATE PF 0.2 MG/ML IJ SOSY
PREFILLED_SYRINGE | INTRAMUSCULAR | Status: AC
  Filled 2020-07-26: qty 1

## 2020-07-26 MED ORDER — EPHEDRINE SULFATE-NACL 50-0.9 MG/10ML-% IV SOSY
PREFILLED_SYRINGE | INTRAVENOUS | Status: DC | PRN
  Administered 2020-07-26 (×5): 10 mg via INTRAVENOUS

## 2020-07-26 MED ORDER — ACETAMINOPHEN 500 MG PO TABS
1000.0000 mg | ORAL_TABLET | Freq: Three times a day (TID) | ORAL | Status: AC
  Administered 2020-07-26 – 2020-07-27 (×3): 1000 mg via ORAL
  Filled 2020-07-26 (×3): qty 2

## 2020-07-26 MED ORDER — ONDANSETRON HCL 4 MG/2ML IJ SOLN
INTRAMUSCULAR | Status: DC | PRN
  Administered 2020-07-26: 4 mg via INTRAVENOUS

## 2020-07-26 MED ORDER — INSULIN ASPART 100 UNIT/ML ~~LOC~~ SOLN
3.0000 [IU] | Freq: Three times a day (TID) | SUBCUTANEOUS | Status: DC
  Administered 2020-07-26 – 2020-07-27 (×2): 3 [IU] via SUBCUTANEOUS

## 2020-07-26 MED ORDER — ORAL CARE MOUTH RINSE: 15.0000 mL | Freq: Once | OROMUCOSAL | Status: AC

## 2020-07-26 MED ORDER — INSULIN ASPART 100 UNIT/ML ~~LOC~~ SOLN
SUBCUTANEOUS | Status: AC
  Filled 2020-07-26: qty 1

## 2020-07-26 MED ORDER — METOPROLOL TARTRATE 25 MG PO TABS
25.0000 mg | ORAL_TABLET | Freq: Two times a day (BID) | ORAL | Status: DC
  Administered 2020-07-26 – 2020-07-27 (×2): 25 mg via ORAL
  Filled 2020-07-26 (×2): qty 1

## 2020-07-26 MED ORDER — AMITRIPTYLINE HCL 25 MG PO TABS
100.0000 mg | ORAL_TABLET | Freq: Every day | ORAL | Status: DC
  Administered 2020-07-26: 100 mg via ORAL
  Filled 2020-07-26: qty 4

## 2020-07-26 MED ORDER — ENZALUTAMIDE 40 MG PO CAPS: 160.0000 mg | ORAL_CAPSULE | Freq: Every day | ORAL | Status: DC

## 2020-07-26 MED ORDER — DEXAMETHASONE SODIUM PHOSPHATE 10 MG/ML IJ SOLN
INTRAMUSCULAR | Status: AC
  Filled 2020-07-26: qty 1

## 2020-07-26 SURGICAL SUPPLY — 19 items
BAG URINE DRAIN 2000ML AR STRL (UROLOGICAL SUPPLIES) ×2 IMPLANT
BAG URO CATCHER STRL LF (MISCELLANEOUS) ×2 IMPLANT
CATH FOLEY 3WAY 30CC 24FR (CATHETERS)
CATH URTH STD 24FR FL 3W 2 (CATHETERS) IMPLANT
ELECT REM PT RETURN 15FT ADLT (MISCELLANEOUS) IMPLANT
GLOVE SURG ENC TEXT LTX SZ7.5 (GLOVE) ×2 IMPLANT
GOWN STRL REUS W/TWL LRG LVL3 (GOWN DISPOSABLE) ×2 IMPLANT
GUIDEWIRE STR DUAL SENSOR (WIRE) IMPLANT
HOLDER FOLEY CATH W/STRAP (MISCELLANEOUS) IMPLANT
IV CATH 14GX2 1/4 (CATHETERS) IMPLANT
KIT TURNOVER KIT A (KITS) ×2 IMPLANT
LOOP CUT BIPOLAR 24F LRG (ELECTROSURGICAL) IMPLANT
MANIFOLD NEPTUNE II (INSTRUMENTS) ×2 IMPLANT
PACK CYSTO (CUSTOM PROCEDURE TRAY) ×2 IMPLANT
SYR 30ML LL (SYRINGE) ×2 IMPLANT
SYR TOOMEY IRRIG 70ML (MISCELLANEOUS) ×2
SYRINGE TOOMEY IRRIG 70ML (MISCELLANEOUS) ×1 IMPLANT
TUBING CONNECTING 10 (TUBING) ×2 IMPLANT
TUBING UROLOGY SET (TUBING) ×2 IMPLANT

## 2020-07-26 NOTE — Discharge Instructions (Signed)
1 - You may have urinary urgency (bladder spasms) and bloody urine on / off for up to 2 weeks. This is normal.  2 - Resume Eliquus when NO visible blood in urine x 24 hours.   3 - Call MD or go to ER for fever >102, severe pain / nausea / vomiting not relieved by medications, or acute change in medical status

## 2020-07-26 NOTE — Plan of Care (Signed)
  Problem: Clinical Measurements: Goal: Will remain free from infection Outcome: Progressing   Problem: Elimination: Goal: Will not experience complications related to urinary retention Outcome: Progressing   Problem: Safety: Goal: Ability to remain free from injury will improve Outcome: Progressing   

## 2020-07-26 NOTE — Brief Op Note (Signed)
07/26/2020  1:16 PM  PATIENT:  Wilfrid Lund  65 y.o. male  PRE-OPERATIVE DIAGNOSIS:  URINARY RETENTION  POST-OPERATIVE DIAGNOSIS:  URINARY RETENTION  PROCEDURE:  Procedure(s) with comments: TRANSURETHRAL RESECTION OF THE PROSTATE (TURP) (N/A) - 75 MINS  SURGEON:  Surgeon(s) and Role:    Alexis Frock, MD - Primary  PHYSICIAN ASSISTANT:   ASSISTANTS: none   ANESTHESIA:   general  EBL:  5 mL   BLOOD ADMINISTERED:none  DRAINS: 33F 3 way foley to NS irrigation   LOCAL MEDICATIONS USED:  NONE  SPECIMEN:  Source of Specimen:  prostate chips  DISPOSITION OF SPECIMEN:  PATHOLOGY  COUNTS:  YES  TOURNIQUET:  * No tourniquets in log *  DICTATION: .Other Dictation: Dictation Number [id number]  PLAN OF CARE: Admit for overnight observation  PATIENT DISPOSITION:  PACU - hemodynamically stable.   Delay start of Pharmacological VTE agent (>24hrs) due to surgical blood loss or risk of bleeding: yes

## 2020-07-26 NOTE — Transfer of Care (Signed)
Immediate Anesthesia Transfer of Care Note  Patient: Richard Hall  Procedure(s) Performed: TRANSURETHRAL RESECTION OF THE PROSTATE (TURP) (N/A )  Patient Location: PACU  Anesthesia Type:General  Level of Consciousness: awake and patient cooperative  Airway & Oxygen Therapy: Patient Spontanous Breathing and Patient connected to face mask oxygen  Post-op Assessment: Report given to RN and Post -op Vital signs reviewed and stable  Post vital signs: Reviewed and stable  Last Vitals:  Vitals Value Taken Time  BP    Temp    Pulse 59 07/26/20 1336  Resp 14 07/26/20 1336  SpO2 100 % 07/26/20 1336  Vitals shown include unvalidated device data.  Last Pain:  Vitals:   07/26/20 1044  TempSrc:   PainSc: 0-No pain         Complications: No complications documented.

## 2020-07-26 NOTE — H&P (Signed)
Richard Hall is an 65 y.o. male.    Chief Complaint:   HPI:   1 - Metastatic Prostate Cancer -   Initial DX: 11/12 core up to 90% Grade 5 cancer by BX 03/2019 on eval PSA 79  Most Recent Restaging: 03/2019 CT, BS - widespread bone mets sacrum, spine. Minimal soft tissue disease.  Prior Treatment: androgen deprivation  Current Treatment: Eligard 45 Q76mo + daily [employer]	[place]   Recent Course:  04/2019 ==> Eligard 45  08/2019 - PSA 1.34 / T <10; 11/2019 - PSA 0.18/T10 ==> Eligard 45; 02/2020 - PSA 0.17, T 13.5; 05/2020 - PSA 0.11, T 10, CMP OK ==>Eligard 45   2 - Urinary Retention - cysto clot evac 01/2019. Prostate not enlarged on imaging / DRE (45gm). No baseline voiding complaints. Passed trial of void 06/2019 after several months on androgen deprivation but soon failed again and catheter dependant, consider outlet procedure once healed from femur fractuire. Failed again 08/2019 and tamsulosin added. Gets folye changes Q mnthly by HHRN. Cards clearance for TURP / hold Eliquus received 04/2020.   3 - Gross Hematuria - large hematuria 01/2019. CT w/o worrisome upper tract lesions (non-con), Cysto w/o bladder lesions. Cleared on catheter irrigation. UCX negative.   4 - Bilateral NON-Complex Renal Cysts - Rt 2.5cm, Lt 2 cm mid cysts w/o vascularity or nodules by CT and [place] 2020.   5 - End Stage Renal Disease - on dialysis TTS through left forearm AVF, follows [facility]. Still makes sig volume urine. Long h/o diabetes.   6 - Cancer Related Pain / Back Pain - pt with known bone mets. Has severe back / rib pain 04/2019. NO LE deficits. STarting hormones. Percocet being XN'A as well.   7 - Bone Metastasis / Bone Health - managed per nephrology, widespread bone mets.   PMH sig for obesity, OSA/CPAP, IDDM2 (A1c 6s), Left femur fracture, mild CVA (Eliqus for prevention only, has come off, folows Dr. Novella Rob cards with [facility]). 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 His PCP is  Loletta Parish MD.   Today " Richard Hall " is seen to proceed with TURP with goal of catheter free. He is off eliquus as instructed and up to date on dialysis. He understands he is NOT good operative candidate due to his substantial comorbidity.     Past Medical History:  Diagnosis Date  . A-V fistula (HCC)    left arm  . Anemia   . Arthritis   . CHF (congestive heart failure) ([place])   . CVA (cerebral vascular accident) ([place]) 2010  . Diabetes mellitus with end-stage renal disease ([place])    Dyalisis pt.  . Diabetic neuropathy (HCC)    Bilateral feet  . Dyslipidemia   . Dysrhythmia    A-fib  . Essential hypertension   . Hepatitis    childhood  . History of blood transfusion   . Hypothyroidism (acquired)   . OSA on CPAP   . Prostate cancer ([place])   . Uses wheelchair     Past Surgical History:  Procedure Laterality Date  . APPENDECTOMY    . COLONOSCOPY    . FEMUR IM NAIL Left 05/20/2019   Procedure: Intramedullary (Im) Retrograde Femoral Nailing for impending pathologic fracture;  Surgeon: Altamese [place], MD;  Location: [facility];  Service: Orthopedics;  Laterality: Left;  . ORIF FEMUR FRACTURE Left 05/20/2019   Procedure: OPEN REDUCTION INTERNAL FIXATION (ORIF) DISTAL FEMUR FRACTURE with intercondylar extension;  Surgeon: Altamese [place], MD;  Location:  [facility];  Service: Orthopedics;  Laterality: Left;  . TRANSURETHRAL RESECTION OF BLADDER TUMOR N/A 01/07/2019   Procedure: CYSTOSCOPY/ EVEACUATION CLOT;  Surgeon: Bjorn Loser, MD;  Location: [facility];  Service: Urology;  Laterality: N/A;    History reviewed. No pertinent family history. Social History:  reports that he quit smoking about 25 years ago. He has never used smokeless tobacco. He reports that he does not drink alcohol and does not use drugs.  Allergies:  Allergies  Allergen Reactions  . Gabapentin Swelling    No medications prior to admission.    Results for orders placed or performed during the hospital encounter of  07/24/20 (from the past 48 hour(s))  SARS CORONAVIRUS 2 (TAT 6-24 HRS) Nasopharyngeal Nasopharyngeal Swab     Status: None   Collection Time: 07/24/20  2:10 PM   Specimen: Nasopharyngeal Swab  Result Value Ref Range   SARS Coronavirus 2 NEGATIVE NEGATIVE    Comment: (NOTE) SARS-CoV-2 target nucleic acids are NOT DETECTED.  The SARS-CoV-2 RNA is generally detectable in upper and lower respiratory specimens during the acute phase of infection. Negative results do not preclude SARS-CoV-2 infection, do not rule out co-infections with other pathogens, and should not be used as the sole basis for treatment or other patient management decisions. Negative results must be combined with clinical observations, patient history, and epidemiological information. The expected result is Negative.  Fact Sheet for Patients: SugarRoll.be  Fact Sheet for Healthcare Providers: https://www.woods-mathews.com/  This test is not yet approved or cleared by the [place] FDA and  has been authorized for detection and/or diagnosis of SARS-CoV-2 by FDA under an Emergency Use Authorization (EUA). This EUA will remain  in effect (meaning this test can be used) for the duration of the COVID-19 declaration under Se ction 564(b)(1) of the Act, 21 U.S.C. section 360bbb-3(b)(1), unless the authorization is terminated or revoked sooner.  Performed at [facility], [place] [address] [place],  [address]    No results found.  Review of Systems  Constitutional: Positive for fatigue. Negative for fever.  All other systems reviewed and are negative.   Height 6' (1.829 m), weight 121.6 kg. Physical Exam Vitals reviewed.  HENT:     Head: Normocephalic.     Nose: Nose normal.  Eyes:     Pupils: Pupils are equal, round, and reactive to light.  Cardiovascular:     Rate and Rhythm: Normal rate.  Pulmonary:     Effort: Pulmonary effort is normal.   Abdominal:     Comments: Large table truncal obesity.   Genitourinary:    Comments: Foley in place with NON-foul urine.  Musculoskeletal:     Cervical back: Normal range of motion.  Neurological:     General: No focal deficit present.     Mental Status: He is alert.      Assessment/Plan  Proceed as planned with TURP with goal of eventual catheter free. Risks, benefits, alternatives, expected peri-op course and fact that his substantial comorbidity placed him at at least 10X increased risk of ALL complications including CVA, MI, Infection, and mortality.   Hopefully he will have uneventful peri-op course and he can go home tomorrow and resume regularly scheduled dialysis Saturday. Will plan for judicious fluids peri-op.   Alexis Frock, MD 07/26/2020, 7:48 AM

## 2020-07-26 NOTE — Anesthesia Postprocedure Evaluation (Signed)
Anesthesia Post Note  Patient: Richard Hall  Procedure(s) Performed: TRANSURETHRAL RESECTION OF THE PROSTATE (TURP) (N/A )     Patient location during evaluation: PACU Anesthesia Type: General Level of consciousness: awake and alert Pain management: pain level controlled Vital Signs Assessment: post-procedure vital signs reviewed and stable Respiratory status: spontaneous breathing, nonlabored ventilation, respiratory function stable and patient connected to nasal cannula oxygen Cardiovascular status: blood pressure returned to baseline and stable Postop Assessment: no apparent nausea or vomiting Anesthetic complications: no   No complications documented.  Last Vitals:  Vitals:   07/26/20 1400 07/26/20 1415  BP: (!) 143/86 (!) 152/74  Pulse: 61 60  Resp: 15 12  Temp:    SpO2: 100% 100%    Last Pain:  Vitals:   07/26/20 1415  TempSrc:   PainSc: 4                  [name]

## 2020-07-26 NOTE — Anesthesia Procedure Notes (Signed)
Procedure Name: LMA Insertion Date/Time: 07/26/2020 12:50 PM Performed by: Gwyndolyn Saxon, CRNA Pre-anesthesia Checklist: Patient identified, Emergency Drugs available, Suction available and Patient being monitored Patient Re-evaluated:Patient Re-evaluated prior to induction Oxygen Delivery Method: Circle system utilized Preoxygenation: Pre-oxygenation with 100% oxygen Induction Type: IV induction Ventilation: Oral airway inserted - appropriate to patient size LMA: LMA with gastric port inserted LMA Size: 4.0 Number of attempts: 1 Placement Confirmation: positive ETCO2 and breath sounds checked- equal and bilateral Tube secured with: Tape Dental Injury: Teeth and Oropharynx as per pre-operative assessment

## 2020-07-26 NOTE — Progress Notes (Signed)
Pt refused CPAP qhs.  Pt encouraged to contact RT should he change his mind.   

## 2020-07-27 ENCOUNTER — Encounter (HOSPITAL_COMMUNITY): Payer: Self-pay | Admitting: Urology

## 2020-07-27 DIAGNOSIS — C61 Malignant neoplasm of prostate: Secondary | ICD-10-CM | POA: Diagnosis not present

## 2020-07-27 LAB — BASIC METABOLIC PANEL
Anion gap: 13 (ref 5–15)
BUN: 96 mg/dL — ABNORMAL HIGH (ref 8–23)
CO2: 27 mmol/L (ref 22–32)
Calcium: 9.1 mg/dL (ref 8.9–10.3)
Chloride: 98 mmol/L (ref 98–111)
Creatinine, Ser: 6.34 mg/dL — ABNORMAL HIGH (ref 0.61–1.24)
GFR, Estimated: 9 mL/min — ABNORMAL LOW (ref >60)
Glucose, Bld: 124 mg/dL — ABNORMAL HIGH (ref 70–99)
Potassium: 3.9 mmol/L (ref 3.5–5.1)
Sodium: 138 mmol/L (ref 135–145)

## 2020-07-27 LAB — SURGICAL PATHOLOGY

## 2020-07-27 LAB — GLUCOSE, CAPILLARY: Glucose-Capillary: 90 mg/dL (ref 70–99)

## 2020-07-27 LAB — HEMOGLOBIN AND HEMATOCRIT, BLOOD
HCT: 28.7 % — ABNORMAL LOW (ref 39.0–52.0)
Hemoglobin: 9.1 g/dL — ABNORMAL LOW (ref 13.0–17.0)

## 2020-07-27 MED ORDER — CHLORHEXIDINE GLUCONATE CLOTH 2 % EX PADS: 6.0000 | MEDICATED_PAD | Freq: Every day | CUTANEOUS | Status: DC

## 2020-07-27 MED ORDER — TRAMADOL HCL 50 MG PO TABS: 50.0000 mg | ORAL_TABLET | Freq: Four times a day (QID) | ORAL | 0 refills | Status: DC | PRN

## 2020-07-27 NOTE — Discharge Summary (Signed)
Physician Discharge Summary  Patient ID: Richard Hall MRN: [id number] DOB/AGE: 1955/08/22 65 y.o.  Admit date: 07/26/2020 Discharge date: 07/27/2020  Admission Diagnoses:Urinary Retention  Discharge Diagnoses:  Active Problems:   Urinary retention   Discharged Condition: fair  Hospital Course: Pt underwent Transurethral Resection of Prostate on 07/26/20, the day of admission, without acute complication. He was admitted to 4th floor Urology service post-op and observed overnight on very gently bladder irrigaiton. By the AM of POD 1, he is ambulatory, pain controlled, volume status and K acceptable (3.9), urine very very light pink off irrigation, and felt to be adeuate for discharge. He has dialysis scheudled for later today as per routine. He is facile with catheter care.   Consults: None  Significant Diagnostic Studies: labs: as per above. Surgical path pending.   Treatments: surgery: TURP  Discharge Exam: Blood pressure 107/75, pulse (!) 58, temperature 99.7 F (37.6 C), resp. rate 18, height 6' (1.829 m), weight 125.3 kg, SpO2 97 %. General appearance: alert, cooperative and stigmata of chronic disease, at baseline, very plesant.  Eyes: negative Nose: Nares normal. Septum midline. Mucosa normal. No drainage or sinus tenderness. Throat: lips, mucosa, and tongue normal; teeth and gums normal Neck: supple, symmetrical, trachea midline Back: symmetric, no curvature. ROM normal. No CVA tenderness. Resp: stable mild increase WOB. No distress, No wheezes, at baseline.  Cardio: Nl rate GI: stable large truncal obesity.  Male genitalia: normal, 3 way foley in place with very very light pink urine OFF irrigaion.  Extremities: extremities normal, atraumatic, no cyanosis or edema and LUE AVF with stable thrill.  Lymph nodes: Cervical, supraclavicular, and axillary nodes normal. Neurologic: Grossly normal  Disposition: HOME     Follow-up Information    Alexis Frock, MD On 07/31/2020.    Specialty: Urology Why: for MD visit and office catheter removal.  Contact information: [place] [address] [phone]               Signed: Alexis Frock 07/27/2020, 7:10 AM

## 2020-07-27 NOTE — Op Note (Signed)
NAMEQAMAR, AUGHENBAUGH MEDICAL RECORD NO: [id number] ACCOUNT NO: [id number] DATE OF BIRTH: 1955/08/01 FACILITY: [facility] LOCATION: [facility] PHYSICIAN: Alexis Frock, MD  Operative Report   DATE OF PROCEDURE: 07/26/2020  PREOPERATIVE DIAGNOSES:  Metastatic prostate cancer, prostatic hypertrophy with refractory urinary retention.  PROCEDURE:  Transurethral resection of the prostate.  ESTIMATED BLOOD LOSS:  50 mL  COMPLICATIONS:  None.  FINDINGS: 1.  Mild bilobar prostatic hypertrophy, pre-transurethral resection. 2.  Wide open urinary channel from the bladder neck to the verumontanum post-resection. 3.  Otherwise, unremarkable urinary bladder.  SPECIMENS:  Prostate chips for permanent pathology.  INDICATIONS FOR PROCEDURE:  The patient is a very pleasant but very comorbid 65 year old male with history of metastatic prostate cancer.  He has been on androgen deprivation for quite some time with good control.  He does also have an obstructive  component to his prostate and is now in refractory urinary retention and catheter dependent.  This is quite troubling to him and he has adamantly wished to proceed with a path towards catheter free.  Given the size of his prostate, which is less than 100  grams, it was felt the transurethral resection of the prostate will be most adequate procedure to achieve these goals as he is medication refractory.  He wished to proceed.  We discussed beforehand that given his substantial comorbidity including being  dialysis dependent, morbidly obese  diabetic, that he is at increased risk of all perioperative complications.  He understands this and adamantly wishes to proceed.  Informed consent was obtained and placed in the medical record.  DESCRIPTION OF PROCEDURE:  The patient being identified, procedure being transurethral resection of prostate was confirmed.  Procedure timeout was performed and intravenous antibiotics administered.  General LMA anesthesia induced.   The patient was  placed into a low lithotomy position.  Sterile field was created, prepped and draped the patient's penis, perineum and proximal thigh using iodine.  Cystourethroscopy was performed using a 26-French resectoscope sheath with visual obturator.  Inspection  of anterior and posterior urethra revealed some mild bilobar prostatic hypertrophy with kissing lobes.  Inspection of bladder revealed mild trabeculation, some mild edema from prior catheter.  Ureteral orifices were normal anatomic position and  singleton.  Using the resectoscope loop, very careful transurethral resection was performed in a top down orientation, first to the level of the 12 o'clock from the bladder neck to the area of the verumontanum, creating an open channel.  Then in a top  down approach of the right lobe, then of the left lobe down to the superficial fibromuscular stroma of the prostatic capsule, taking exquisite care not to undermine the bladder neck which did not occur.  Following this, there was excellent wide open  channel visually from the verumontanum to the bladder neck.  Prostate chips were irrigated and set aside for permanent pathology.  The entire resection bed was coagulated with coagulation current.  Hemostasis was excellent.  A 0.038 sensor wire was  advanced to the level of the urinary bladder, over which a new 24-French 3-way Foley catheter was carefully placed to normal saline irrigation and very gentle traction using catheter strap.  Procedure was terminated.  The patient tolerated the procedure  well, no immediate complications.  The patient was taken to postanesthesia care in stable condition.  PLAN:  For observation, admission, likely discharge home tomorrow, so he can resume dialysis tomorrow afternoon.   SHW D: 07/26/2020 1:21:13 pm T: 07/27/2020 5:06:00 am  JOB: [id number]/ [id number]

## 2021-02-27 IMAGING — CT CT HEAD WITHOUT CONTRAST
4 series · 17 of 47 positions shown, 19 images · non-contrast
Comparison: CT head 08/20/2018

CLINICAL DATA: Syncope

EXAM:
CT HEAD WITHOUT CONTRAST
TECHNIQUE: Contiguous axial images were obtained from the base of the skull
through the vertex without intravenous contrast.

[Series 3: head without · axial · non-contrast · 0.46mm/px · z∈[+1081,+1231]mm · 7 of 40 slices shown, 9 images]
[im 5/40  brain]
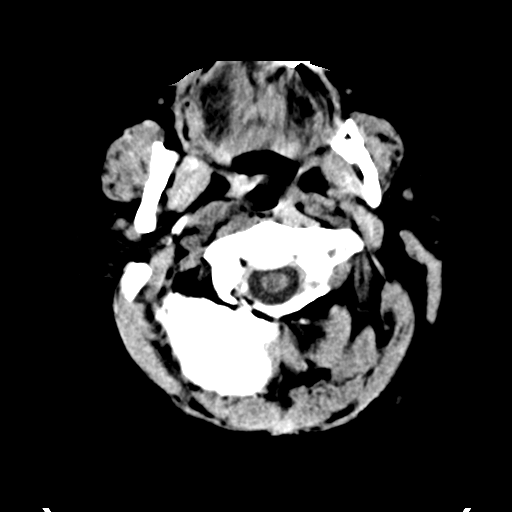
[im 5/40  bone]
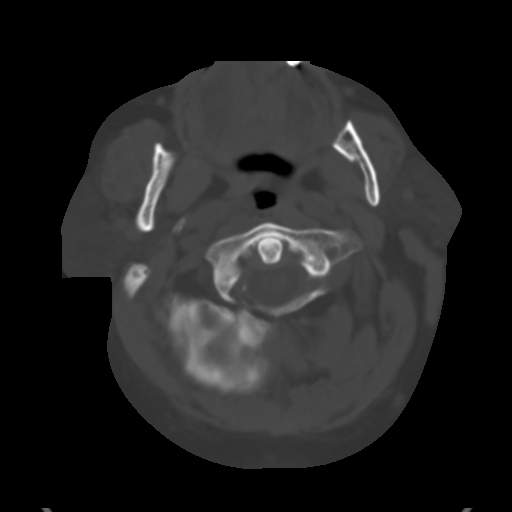
[im 10/40  brain]
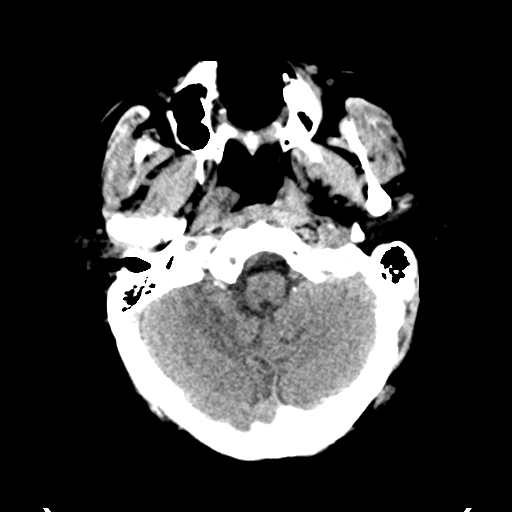
[im 15/40  brain]
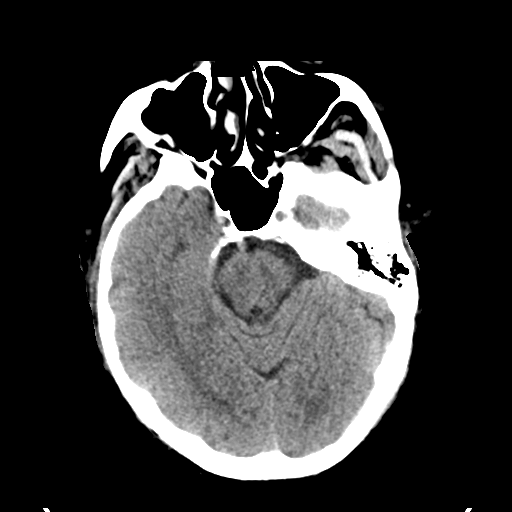
[im 20/40  brain]
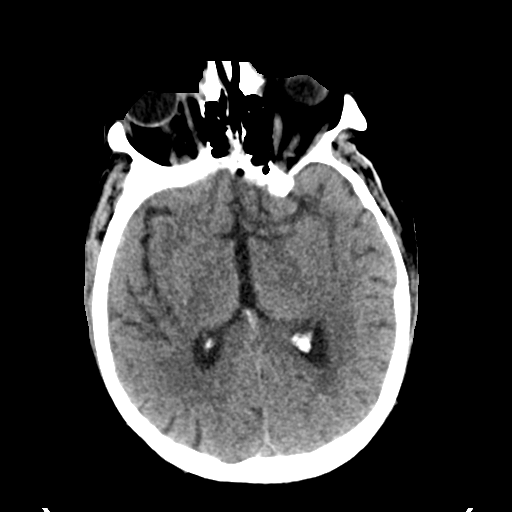
[im 25/40  brain]
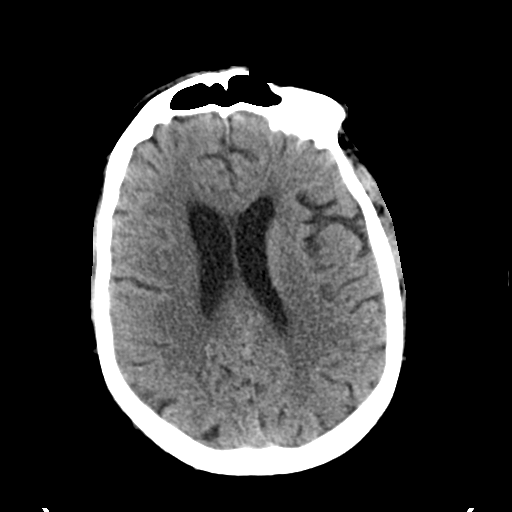
[im 25/40  bone]
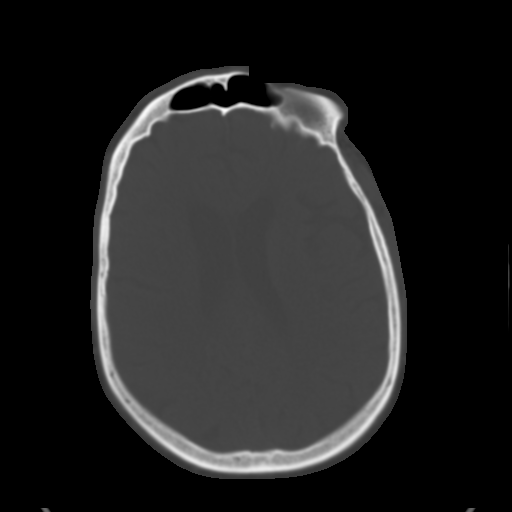
[im 30/40  brain]
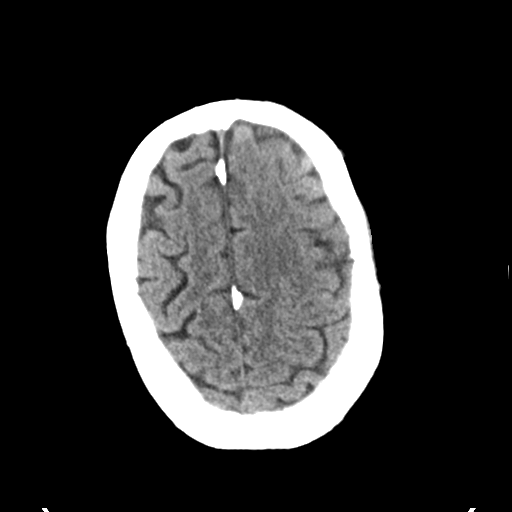
[im 35/40  brain]
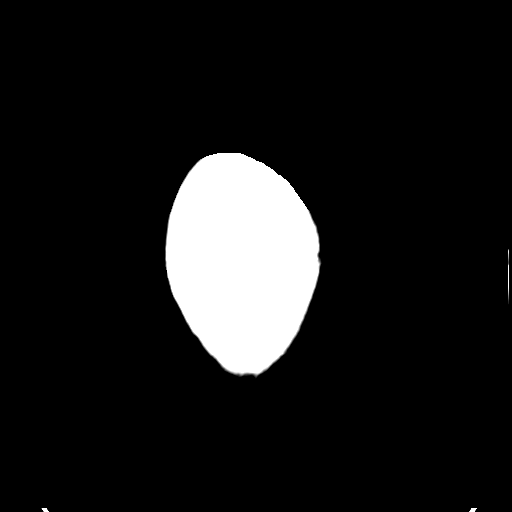

[Series 4: head bone · axial · 0.46mm/px · z∈[+1079,+1149]mm · 4 of 99 slices shown]
[im 10/99  bone]
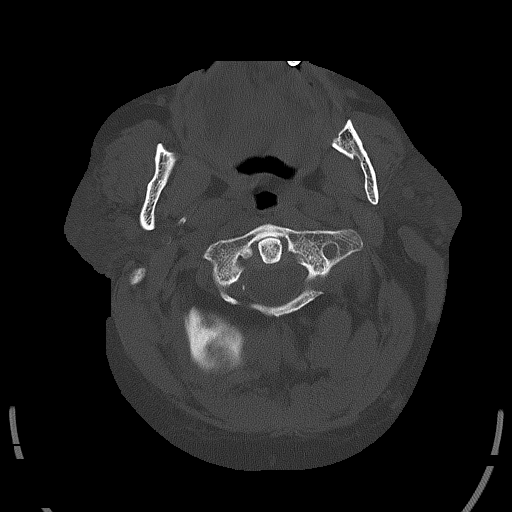
[im 20/99  bone]
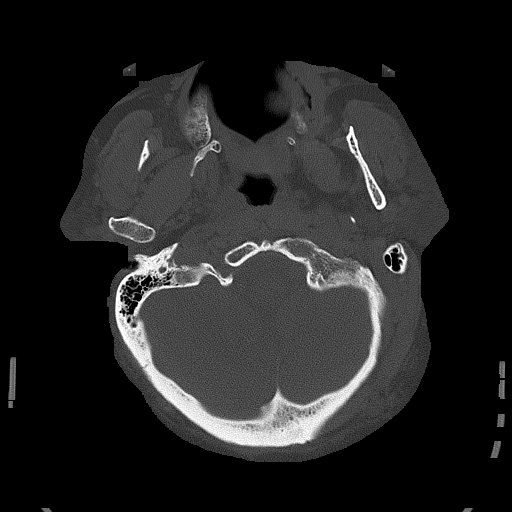
[im 30/99  bone]
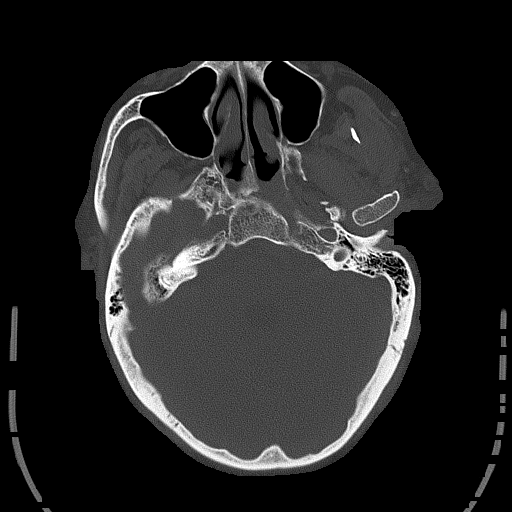
[im 45/99  bone]
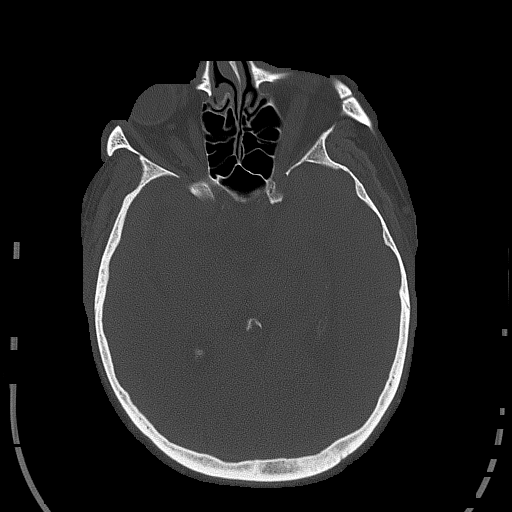

[Series 5: head without cor · coronal · non-contrast · 0.38mm/px · 3 of 74 slices shown]
[im 25/74  brain]
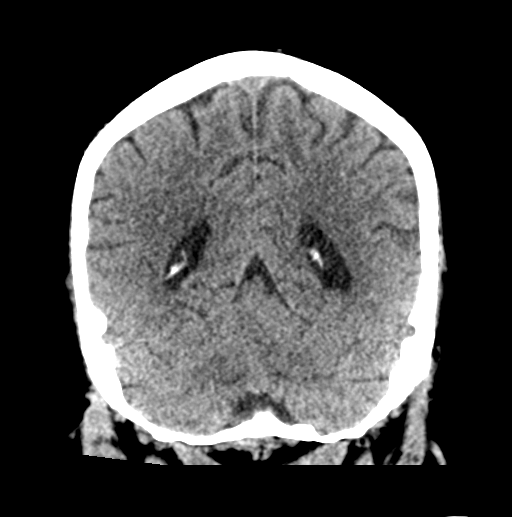
[im 33/74  brain]
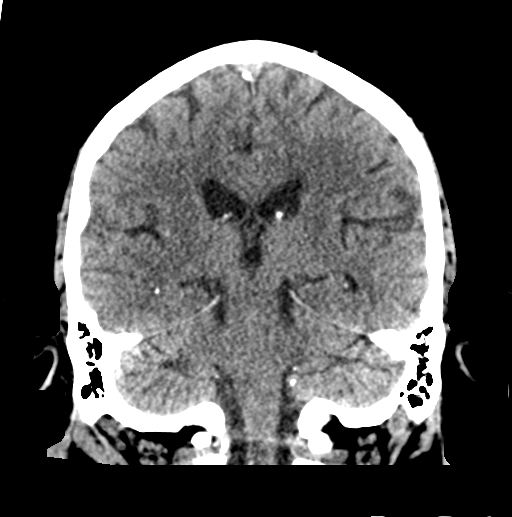
[im 41/74  brain]
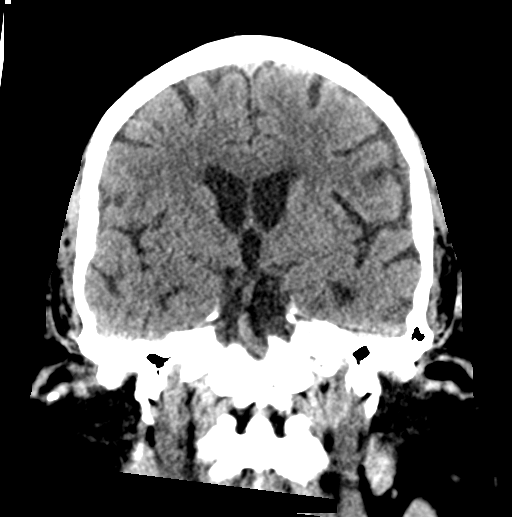

[Series 6: head without sag · sagittal · non-contrast · 0.39mm/px · 3 of 62 slices shown]
[im 21/62  brain]
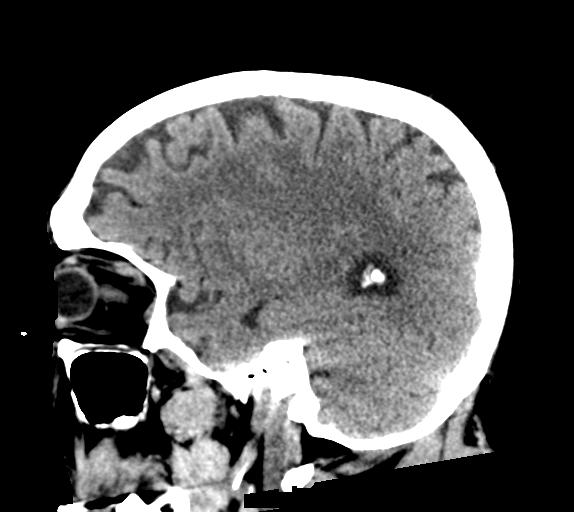
[im 31/62  brain]
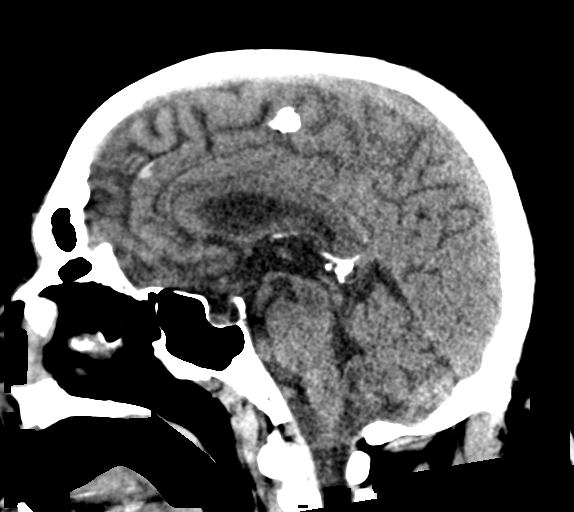
[im 41/62  brain]
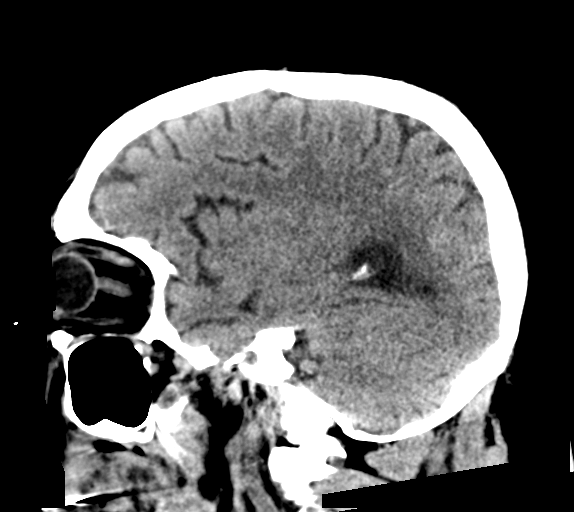

[17 of 47 positions shown; findings below may reference images not displayed]

FINDINGS: Brain: Mild atrophy. Negative for acute infarct, hemorrhage, mass.
No midline shift. Mild hypodensity in the periventricular white
matter left frontal lobe.

Vascular: Negative for hyperdense vessel

Skull: Negative

Sinuses/Orbits: Small chronic infarct right medial orbit unchanged.
Mild mucosal edema paranasal sinuses.

Other: None
IMPRESSION: No acute intracranial abnormality and no change from the prior
study.

## 2021-03-03 IMAGING — US US RENAL
1 series · 13 of 25 positions shown · non-contrast
Comparison: Prior CT from 02/05/2019.

CLINICAL DATA: Initial evaluation for hydronephrosis.

EXAM:
RENAL / URINARY TRACT ULTRASOUND COMPLETE

[Series 1: us renal · 13 of 43 slices shown]
[im 1/43]
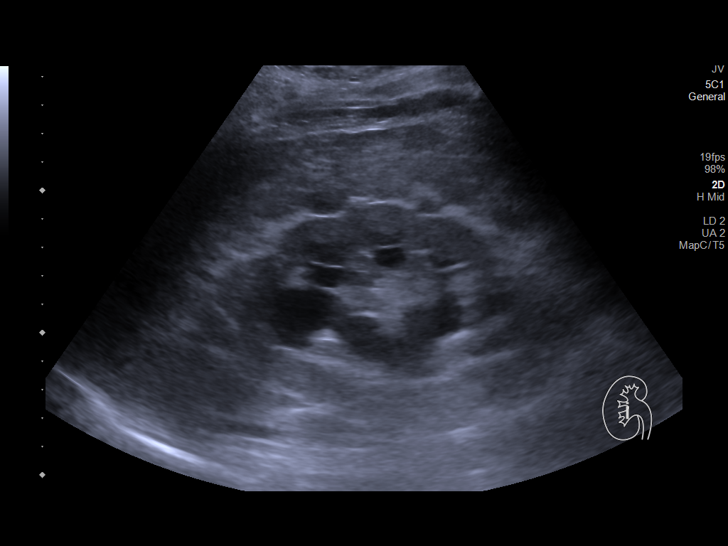
[im 4/43]
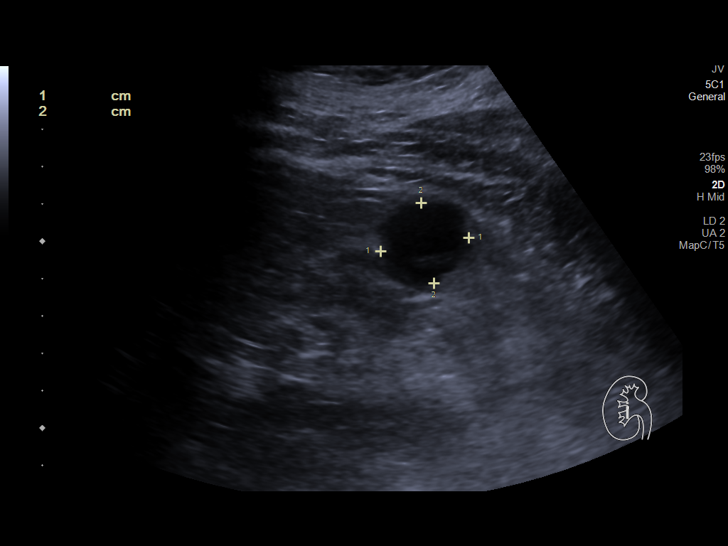
[im 8/43]
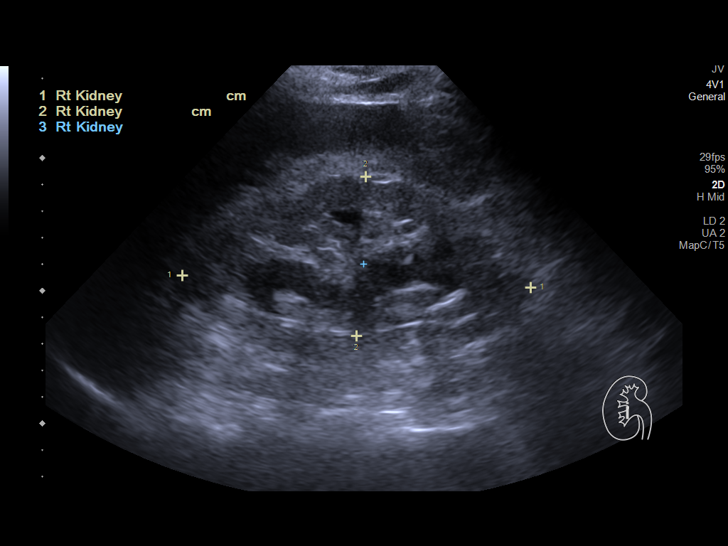
[im 11/43]
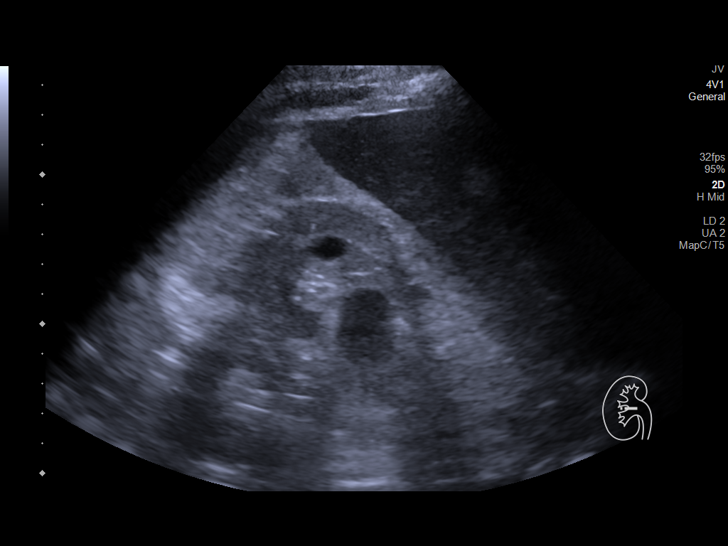
[im 15/43]
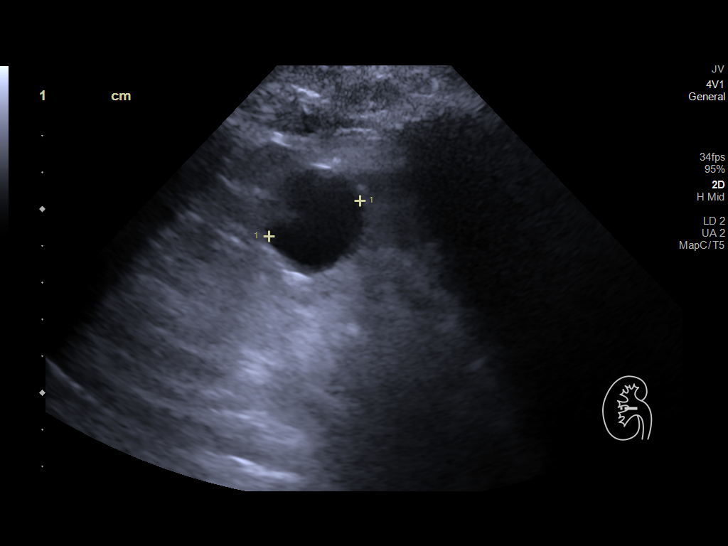
[im 18/43]
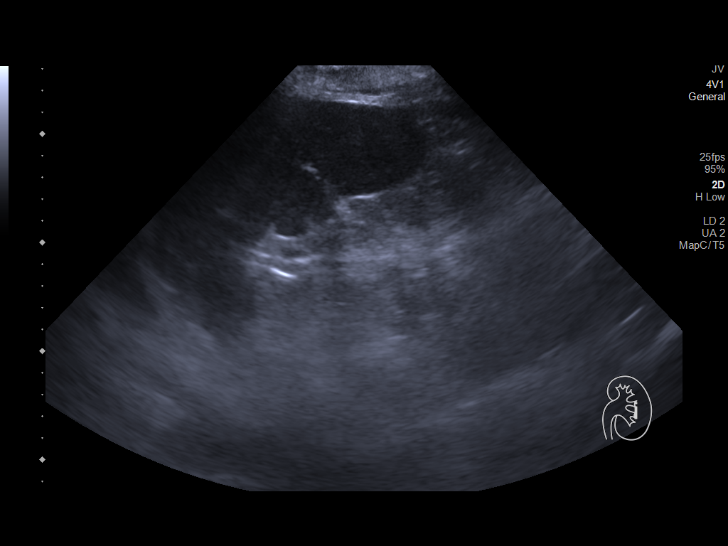
[im 22/43]
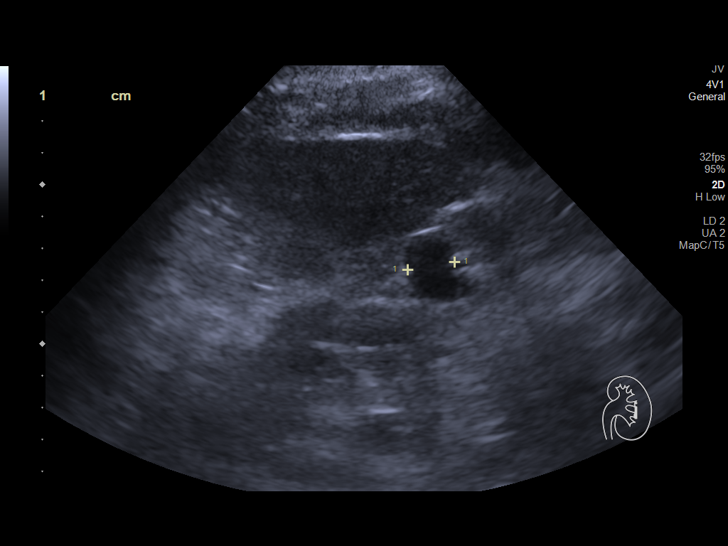
[im 25/43]
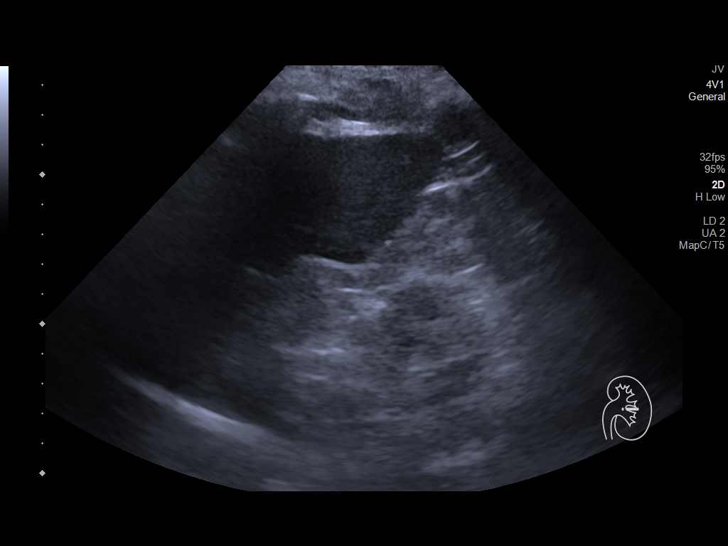
[im 29/43]
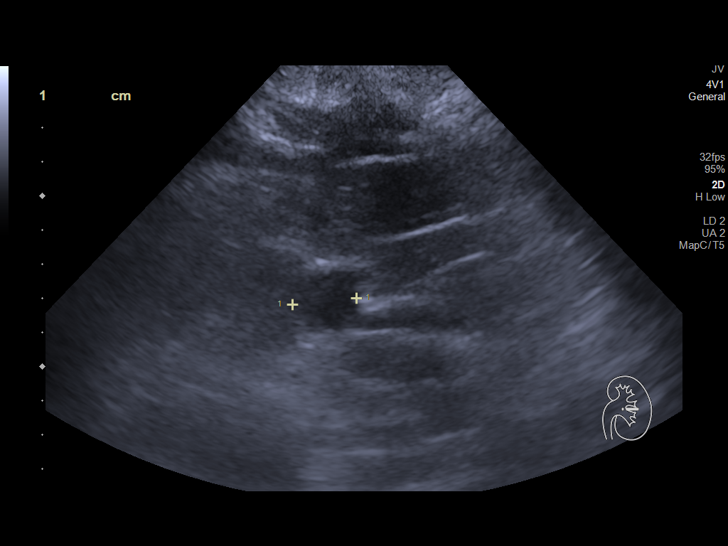
[im 32/43]
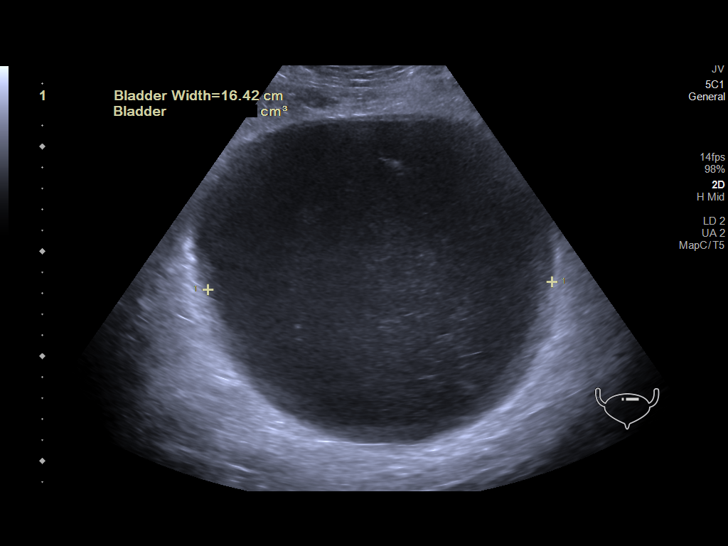
[im 36/43]
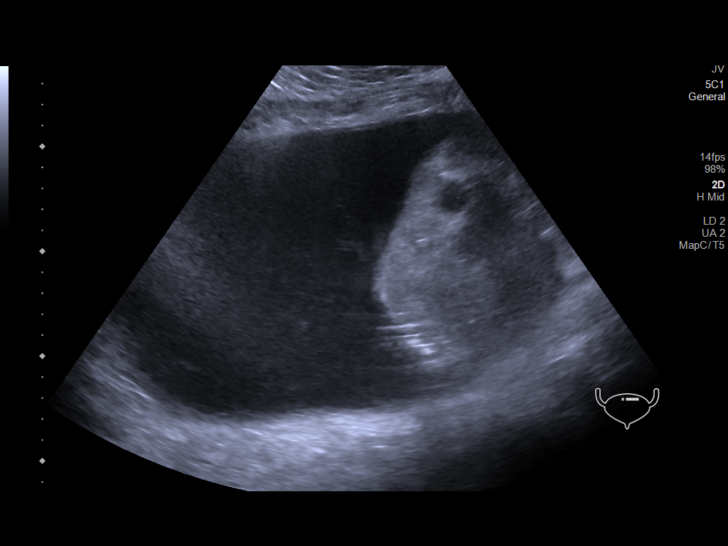
[im 39/43]
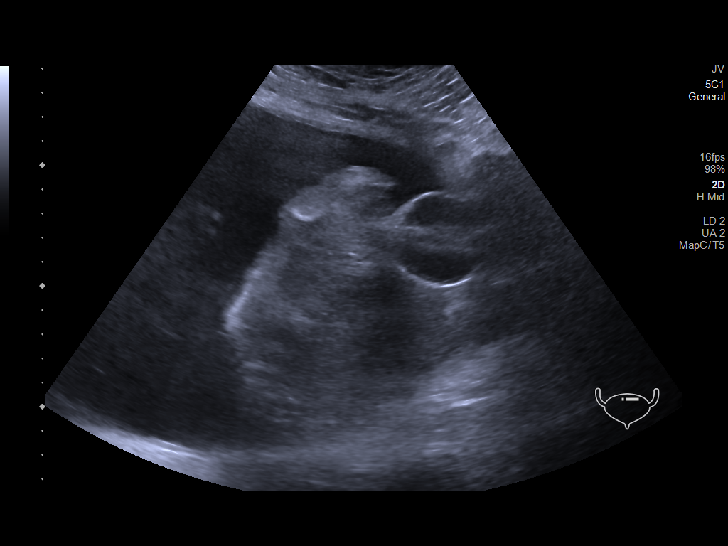
[im 43/43]
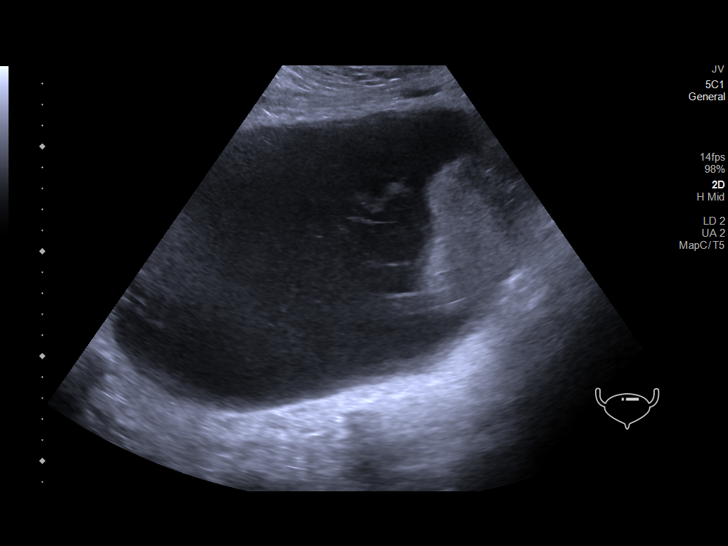

[13 of 25 positions shown; findings below may reference images not displayed]

FINDINGS: Right Kidney:

Renal measurements: 13.1 x 6.0 x 6.6 cm = volume: 272.3 mL.
Increased echogenicity within the renal parenchyma. Underlying mild
hydronephrosis. 2.4 x 2.2 x 2.7 cm exophytic cyst extends from the
interpolar region.

Left Kidney:

Renal measurements: 16.3 x 6.6 x 5.3 cm = volume: 296.9 mL. Mildly
increased echogenicity within the renal parenchyma, suggesting
medical renal disease. No visible hydronephrosis. 1.7 x 2.0 x 1.5 cm
simple exophytic cyst extends from the interpolar region.

Bladder:

Foley catheter partially visualized, tip projecting within an
echogenic lesion. Large echogenic mass-like opacity measuring up to
11.9 cm, concerning for mass and/or hemorrhage, similar to prior CT.
Overall bladder is moderately distended.
IMPRESSION: 1. Persistent mild right-sided hydronephrosis. No significant
left-sided obstructive uropathy.
2. Increased echogenicity within the renal parenchyma, suggesting a
degree of underlying medical renal disease.
3. Large echogenic lesion measuring up to 11.9 cm within the
bladder, which could reflect mass and/or hemorrhage. Foley catheter
in place with tip projecting within this lesion. Persistent moderate
distension of the urinary bladder. Bedside check to ensure Foley
catheter is properly functioning suggested.
4. Simple bilateral renal cysts as above.

## 2021-03-29 ENCOUNTER — Other Ambulatory Visit (HOSPITAL_COMMUNITY): Payer: Self-pay | Admitting: Urology

## 2021-03-29 ENCOUNTER — Other Ambulatory Visit: Payer: Self-pay | Admitting: Urology

## 2021-03-29 DIAGNOSIS — C7951 Secondary malignant neoplasm of bone: Secondary | ICD-10-CM

## 2021-03-29 DIAGNOSIS — C61 Malignant neoplasm of prostate: Secondary | ICD-10-CM

## 2021-04-03 ENCOUNTER — Other Ambulatory Visit (HOSPITAL_COMMUNITY): Payer: Medicare Other

## 2021-04-04 ENCOUNTER — Ambulatory Visit (HOSPITAL_COMMUNITY): Payer: Medicare Other

## 2021-04-04 ENCOUNTER — Encounter (HOSPITAL_COMMUNITY): Payer: Medicare Other

## 2021-04-04 ENCOUNTER — Encounter (HOSPITAL_COMMUNITY): Payer: Self-pay

## 2021-05-31 ENCOUNTER — Encounter (HOSPITAL_COMMUNITY): Payer: Self-pay | Admitting: Internal Medicine

## 2021-05-31 ENCOUNTER — Inpatient Hospital Stay (HOSPITAL_COMMUNITY)
Admission: EM | Admit: 2021-05-31 | Discharge: 2021-06-14 | DRG: 871 | Disposition: A | Discharge status: Skilled Nursing Facility | Payer: Medicare Other | Attending: Internal Medicine | Admitting: Internal Medicine

## 2021-05-31 ENCOUNTER — Emergency Department (HOSPITAL_COMMUNITY): Payer: Medicare Other

## 2021-05-31 DIAGNOSIS — U071 COVID-19: Secondary | ICD-10-CM | POA: Diagnosis not present

## 2021-05-31 DIAGNOSIS — R778 Other specified abnormalities of plasma proteins: Secondary | ICD-10-CM | POA: Diagnosis not present

## 2021-05-31 DIAGNOSIS — T884XXA Failed or difficult intubation, initial encounter: Secondary | ICD-10-CM

## 2021-05-31 DIAGNOSIS — I5023 Acute on chronic systolic (congestive) heart failure: Secondary | ICD-10-CM | POA: Diagnosis not present

## 2021-05-31 DIAGNOSIS — J440 Chronic obstructive pulmonary disease with acute lower respiratory infection: Secondary | ICD-10-CM | POA: Diagnosis present

## 2021-05-31 DIAGNOSIS — M1990 Unspecified osteoarthritis, unspecified site: Secondary | ICD-10-CM | POA: Diagnosis present

## 2021-05-31 DIAGNOSIS — Z9989 Dependence on other enabling machines and devices: Secondary | ICD-10-CM

## 2021-05-31 DIAGNOSIS — Z6834 Body mass index (BMI) 34.0-34.9, adult: Secondary | ICD-10-CM

## 2021-05-31 DIAGNOSIS — G9341 Metabolic encephalopathy: Secondary | ICD-10-CM | POA: Diagnosis not present

## 2021-05-31 DIAGNOSIS — J9621 Acute and chronic respiratory failure with hypoxia: Secondary | ICD-10-CM | POA: Diagnosis present

## 2021-05-31 DIAGNOSIS — L8990 Pressure ulcer of unspecified site, unspecified stage: Secondary | ICD-10-CM | POA: Insufficient documentation

## 2021-05-31 DIAGNOSIS — I4891 Unspecified atrial fibrillation: Secondary | ICD-10-CM | POA: Diagnosis not present

## 2021-05-31 DIAGNOSIS — E785 Hyperlipidemia, unspecified: Secondary | ICD-10-CM | POA: Diagnosis present

## 2021-05-31 DIAGNOSIS — M898X9 Other specified disorders of bone, unspecified site: Secondary | ICD-10-CM | POA: Diagnosis present

## 2021-05-31 DIAGNOSIS — G9340 Encephalopathy, unspecified: Secondary | ICD-10-CM

## 2021-05-31 DIAGNOSIS — I4819 Other persistent atrial fibrillation: Secondary | ICD-10-CM | POA: Diagnosis present

## 2021-05-31 DIAGNOSIS — Z888 Allergy status to other drugs, medicaments and biological substances status: Secondary | ICD-10-CM

## 2021-05-31 DIAGNOSIS — L89152 Pressure ulcer of sacral region, stage 2: Secondary | ICD-10-CM

## 2021-05-31 DIAGNOSIS — Z79899 Other long term (current) drug therapy: Secondary | ICD-10-CM

## 2021-05-31 DIAGNOSIS — Z4659 Encounter for fitting and adjustment of other gastrointestinal appliance and device: Secondary | ICD-10-CM

## 2021-05-31 DIAGNOSIS — J9690 Respiratory failure, unspecified, unspecified whether with hypoxia or hypercapnia: Secondary | ICD-10-CM

## 2021-05-31 DIAGNOSIS — S7292XG Unspecified fracture of left femur, subsequent encounter for closed fracture with delayed healing: Secondary | ICD-10-CM

## 2021-05-31 DIAGNOSIS — A4101 Sepsis due to Methicillin susceptible Staphylococcus aureus: Principal | ICD-10-CM | POA: Diagnosis present

## 2021-05-31 DIAGNOSIS — M25462 Effusion, left knee: Secondary | ICD-10-CM | POA: Diagnosis not present

## 2021-05-31 DIAGNOSIS — B9561 Methicillin susceptible Staphylococcus aureus infection as the cause of diseases classified elsewhere: Secondary | ICD-10-CM

## 2021-05-31 DIAGNOSIS — E875 Hyperkalemia: Secondary | ICD-10-CM | POA: Diagnosis present

## 2021-05-31 DIAGNOSIS — Z79890 Hormone replacement therapy (postmenopausal): Secondary | ICD-10-CM

## 2021-05-31 DIAGNOSIS — E1140 Type 2 diabetes mellitus with diabetic neuropathy, unspecified: Secondary | ICD-10-CM | POA: Diagnosis present

## 2021-05-31 DIAGNOSIS — Z79891 Long term (current) use of opiate analgesic: Secondary | ICD-10-CM

## 2021-05-31 DIAGNOSIS — I7121 Aneurysm of the ascending aorta, without rupture: Secondary | ICD-10-CM | POA: Diagnosis present

## 2021-05-31 DIAGNOSIS — Z7901 Long term (current) use of anticoagulants: Secondary | ICD-10-CM

## 2021-05-31 DIAGNOSIS — Z9079 Acquired absence of other genital organ(s): Secondary | ICD-10-CM

## 2021-05-31 DIAGNOSIS — J441 Chronic obstructive pulmonary disease with (acute) exacerbation: Secondary | ICD-10-CM | POA: Diagnosis present

## 2021-05-31 DIAGNOSIS — N186 End stage renal disease: Secondary | ICD-10-CM | POA: Diagnosis present

## 2021-05-31 DIAGNOSIS — Z7984 Long term (current) use of oral hypoglycemic drugs: Secondary | ICD-10-CM

## 2021-05-31 DIAGNOSIS — I482 Chronic atrial fibrillation, unspecified: Secondary | ICD-10-CM | POA: Diagnosis present

## 2021-05-31 DIAGNOSIS — R7881 Bacteremia: Secondary | ICD-10-CM

## 2021-05-31 DIAGNOSIS — N2581 Secondary hyperparathyroidism of renal origin: Secondary | ICD-10-CM | POA: Diagnosis present

## 2021-05-31 DIAGNOSIS — Z8546 Personal history of malignant neoplasm of prostate: Secondary | ICD-10-CM

## 2021-05-31 DIAGNOSIS — I132 Hypertensive heart and chronic kidney disease with heart failure and with stage 5 chronic kidney disease, or end stage renal disease: Secondary | ICD-10-CM | POA: Diagnosis present

## 2021-05-31 DIAGNOSIS — E1122 Type 2 diabetes mellitus with diabetic chronic kidney disease: Secondary | ICD-10-CM | POA: Diagnosis present

## 2021-05-31 DIAGNOSIS — Z993 Dependence on wheelchair: Secondary | ICD-10-CM

## 2021-05-31 DIAGNOSIS — G4733 Obstructive sleep apnea (adult) (pediatric): Secondary | ICD-10-CM | POA: Diagnosis present

## 2021-05-31 DIAGNOSIS — Z9981 Dependence on supplemental oxygen: Secondary | ICD-10-CM

## 2021-05-31 DIAGNOSIS — E1165 Type 2 diabetes mellitus with hyperglycemia: Secondary | ICD-10-CM | POA: Diagnosis not present

## 2021-05-31 DIAGNOSIS — C7951 Secondary malignant neoplasm of bone: Secondary | ICD-10-CM | POA: Diagnosis present

## 2021-05-31 DIAGNOSIS — E669 Obesity, unspecified: Secondary | ICD-10-CM | POA: Diagnosis present

## 2021-05-31 DIAGNOSIS — R7401 Elevation of levels of liver transaminase levels: Secondary | ICD-10-CM | POA: Diagnosis present

## 2021-05-31 DIAGNOSIS — X58XXXD Exposure to other specified factors, subsequent encounter: Secondary | ICD-10-CM | POA: Diagnosis present

## 2021-05-31 DIAGNOSIS — I10 Essential (primary) hypertension: Secondary | ICD-10-CM | POA: Diagnosis present

## 2021-05-31 DIAGNOSIS — Z95818 Presence of other cardiac implants and grafts: Secondary | ICD-10-CM

## 2021-05-31 DIAGNOSIS — E871 Hypo-osmolality and hyponatremia: Secondary | ICD-10-CM | POA: Diagnosis present

## 2021-05-31 DIAGNOSIS — E039 Hypothyroidism, unspecified: Secondary | ICD-10-CM | POA: Diagnosis present

## 2021-05-31 DIAGNOSIS — Z6836 Body mass index (BMI) 36.0-36.9, adult: Secondary | ICD-10-CM

## 2021-05-31 DIAGNOSIS — K029 Dental caries, unspecified: Secondary | ICD-10-CM | POA: Diagnosis present

## 2021-05-31 DIAGNOSIS — E440 Moderate protein-calorie malnutrition: Secondary | ICD-10-CM | POA: Diagnosis not present

## 2021-05-31 DIAGNOSIS — Z992 Dependence on renal dialysis: Secondary | ICD-10-CM

## 2021-05-31 DIAGNOSIS — I083 Combined rheumatic disorders of mitral, aortic and tricuspid valves: Secondary | ICD-10-CM | POA: Diagnosis present

## 2021-05-31 DIAGNOSIS — D631 Anemia in chronic kidney disease: Secondary | ICD-10-CM | POA: Diagnosis present

## 2021-05-31 DIAGNOSIS — Z794 Long term (current) use of insulin: Secondary | ICD-10-CM

## 2021-05-31 DIAGNOSIS — R52 Pain, unspecified: Secondary | ICD-10-CM

## 2021-05-31 DIAGNOSIS — I5033 Acute on chronic diastolic (congestive) heart failure: Secondary | ICD-10-CM | POA: Diagnosis present

## 2021-05-31 DIAGNOSIS — Z87891 Personal history of nicotine dependence: Secondary | ICD-10-CM

## 2021-05-31 DIAGNOSIS — R6521 Severe sepsis with septic shock: Secondary | ICD-10-CM | POA: Diagnosis not present

## 2021-05-31 DIAGNOSIS — I509 Heart failure, unspecified: Secondary | ICD-10-CM

## 2021-05-31 DIAGNOSIS — J1282 Pneumonia due to coronavirus disease 2019: Secondary | ICD-10-CM | POA: Diagnosis present

## 2021-05-31 DIAGNOSIS — Z8673 Personal history of transient ischemic attack (TIA), and cerebral infarction without residual deficits: Secondary | ICD-10-CM

## 2021-05-31 LAB — CBC WITH DIFFERENTIAL/PLATELET
Abs Immature Granulocytes: 0.06 10*3/uL (ref 0.00–0.07)
Basophils Absolute: 0 10*3/uL (ref 0.0–0.1)
Basophils Relative: 0 %
Eosinophils Absolute: 0 10*3/uL (ref 0.0–0.5)
Eosinophils Relative: 0 %
HCT: 27.2 % — ABNORMAL LOW (ref 39.0–52.0)
Hemoglobin: 8.7 g/dL — ABNORMAL LOW (ref 13.0–17.0)
Immature Granulocytes: 1 %
Lymphocytes Relative: 10 %
Lymphs Abs: 1.2 10*3/uL (ref 0.7–4.0)
MCH: 29.1 pg (ref 26.0–34.0)
MCHC: 32 g/dL (ref 30.0–36.0)
MCV: 91 fL (ref 80.0–100.0)
Monocytes Absolute: 0.7 10*3/uL (ref 0.1–1.0)
Monocytes Relative: 6 %
Neutro Abs: 10.1 10*3/uL — ABNORMAL HIGH (ref 1.7–7.7)
Neutrophils Relative %: 83 %
Platelets: 269 10*3/uL (ref 150–400)
RBC: 2.99 MIL/uL — ABNORMAL LOW (ref 4.22–5.81)
RDW: 18.2 % — ABNORMAL HIGH (ref 11.5–15.5)
WBC: 12.1 10*3/uL — ABNORMAL HIGH (ref 4.0–10.5)
nRBC: 0.2 % (ref 0.0–0.2)

## 2021-05-31 LAB — I-STAT VENOUS BLOOD GAS, ED
Acid-Base Excess: 7 mmol/L — ABNORMAL HIGH (ref 0.0–2.0)
Bicarbonate: 32.3 mmol/L — ABNORMAL HIGH (ref 20.0–28.0)
Calcium, Ion: 0.86 mmol/L — CL (ref 1.15–1.40)
HCT: 29 % — ABNORMAL LOW (ref 39.0–52.0)
Hemoglobin: 9.9 g/dL — ABNORMAL LOW (ref 13.0–17.0)
O2 Saturation: 96 %
Potassium: 3.8 mmol/L (ref 3.5–5.1)
Sodium: 133 mmol/L — ABNORMAL LOW (ref 135–145)
TCO2: 34 mmol/L — ABNORMAL HIGH (ref 22–32)
pCO2, Ven: 46.5 mmHg (ref 44.0–60.0)
pH, Ven: 7.449 — ABNORMAL HIGH (ref 7.250–7.430)
pO2, Ven: 80 mmHg — ABNORMAL HIGH (ref 32.0–45.0)

## 2021-05-31 LAB — BLOOD GAS, VENOUS
Acid-Base Excess: 2.7 mmol/L — ABNORMAL HIGH (ref 0.0–2.0)
Bicarbonate: 26.5 mmol/L (ref 20.0–28.0)
FIO2: 21
O2 Saturation: 96.7 %
Patient temperature: 37
pCO2, Ven: 39.4 mmHg — ABNORMAL LOW (ref 44.0–60.0)
pH, Ven: 7.443 — ABNORMAL HIGH (ref 7.250–7.430)
pO2, Ven: 96.3 mmHg — ABNORMAL HIGH (ref 32.0–45.0)

## 2021-05-31 LAB — I-STAT CHEM 8, ED
BUN: 52 mg/dL — ABNORMAL HIGH (ref 8–23)
Calcium, Ion: 0.85 mmol/L — CL (ref 1.15–1.40)
Chloride: 91 mmol/L — ABNORMAL LOW (ref 98–111)
Creatinine, Ser: 5.1 mg/dL — ABNORMAL HIGH (ref 0.61–1.24)
Glucose, Bld: 88 mg/dL (ref 70–99)
HCT: 29 % — ABNORMAL LOW (ref 39.0–52.0)
Hemoglobin: 9.9 g/dL — ABNORMAL LOW (ref 13.0–17.0)
Potassium: 3.8 mmol/L (ref 3.5–5.1)
Sodium: 132 mmol/L — ABNORMAL LOW (ref 135–145)
TCO2: 30 mmol/L (ref 22–32)

## 2021-05-31 LAB — TROPONIN I (HIGH SENSITIVITY)
Troponin I (High Sensitivity): 707 ng/L (ref <18)
Troponin I (High Sensitivity): 660 ng/L (ref <18)

## 2021-05-31 LAB — RESP PANEL BY RT-PCR (FLU A&B, COVID) ARPGX2
Influenza A by PCR: NEGATIVE
Influenza B by PCR: NEGATIVE
SARS Coronavirus 2 by RT PCR: POSITIVE — AB

## 2021-05-31 LAB — COMPREHENSIVE METABOLIC PANEL
ALT: 229 U/L — ABNORMAL HIGH (ref 0–44)
AST: 463 U/L — ABNORMAL HIGH (ref 15–41)
Albumin: 3 g/dL — ABNORMAL LOW (ref 3.5–5.0)
Alkaline Phosphatase: 112 U/L (ref 38–126)
Anion gap: 16 — ABNORMAL HIGH (ref 5–15)
BUN: 49 mg/dL — ABNORMAL HIGH (ref 8–23)
CO2: 27 mmol/L (ref 22–32)
Calcium: 7.8 mg/dL — ABNORMAL LOW (ref 8.9–10.3)
Chloride: 91 mmol/L — ABNORMAL LOW (ref 98–111)
Creatinine, Ser: 5.09 mg/dL — ABNORMAL HIGH (ref 0.61–1.24)
GFR, Estimated: 12 mL/min — ABNORMAL LOW (ref >60)
Glucose, Bld: 93 mg/dL (ref 70–99)
Potassium: 3.9 mmol/L (ref 3.5–5.1)
Sodium: 134 mmol/L — ABNORMAL LOW (ref 135–145)
Total Bilirubin: 0.4 mg/dL (ref 0.3–1.2)
Total Protein: 7.7 g/dL (ref 6.5–8.1)

## 2021-05-31 LAB — CBG MONITORING, ED: Glucose-Capillary: 159 mg/dL — ABNORMAL HIGH (ref 70–99)

## 2021-05-31 LAB — HIV ANTIBODY (ROUTINE TESTING W REFLEX): HIV Screen 4th Generation wRfx: NONREACTIVE

## 2021-05-31 LAB — HEPATITIS PANEL, ACUTE
HCV Ab: NONREACTIVE
Hep A IgM: NONREACTIVE
Hep B C IgM: NONREACTIVE
Hepatitis B Surface Ag: NONREACTIVE

## 2021-05-31 LAB — BRAIN NATRIURETIC PEPTIDE: B Natriuretic Peptide: 1212.6 pg/mL — ABNORMAL HIGH (ref 0.0–100.0)

## 2021-05-31 MED ORDER — APIXABAN 2.5 MG PO TABS
2.5000 mg | ORAL_TABLET | Freq: Two times a day (BID) | ORAL | Status: DC
  Administered 2021-05-31 – 2021-06-02 (×4): 2.5 mg via ORAL
  Filled 2021-05-31 (×4): qty 1

## 2021-05-31 MED ORDER — AMIODARONE HCL IN DEXTROSE 360-4.14 MG/200ML-% IV SOLN
30.0000 mg/h | INTRAVENOUS | Status: DC
  Administered 2021-06-01: 30 mg/h via INTRAVENOUS
  Filled 2021-05-31 (×4): qty 200

## 2021-05-31 MED ORDER — GLIPIZIDE 5 MG PO TABS: 5.0000 mg | ORAL_TABLET | Freq: Every day | ORAL | Status: DC

## 2021-05-31 MED ORDER — ACETAMINOPHEN 325 MG PO TABS
650.0000 mg | ORAL_TABLET | Freq: Four times a day (QID) | ORAL | Status: DC | PRN
  Administered 2021-06-06: 650 mg via ORAL
  Filled 2021-05-31: qty 2

## 2021-05-31 MED ORDER — FUROSEMIDE 10 MG/ML IJ SOLN
40.0000 mg | Freq: Once | INTRAMUSCULAR | Status: AC
  Administered 2021-05-31: 20:00:00 40 mg via INTRAVENOUS
  Filled 2021-05-31: qty 4

## 2021-05-31 MED ORDER — ALBUTEROL SULFATE (2.5 MG/3ML) 0.083% IN NEBU: 18.0000 mL | INHALATION_SOLUTION | Freq: Once | RESPIRATORY_TRACT | Status: DC

## 2021-05-31 MED ORDER — ACETAMINOPHEN 650 MG RE SUPP
650.0000 mg | Freq: Four times a day (QID) | RECTAL | Status: DC | PRN
  Administered 2021-06-05 – 2021-06-06 (×4): 650 mg via RECTAL
  Filled 2021-05-31 (×4): qty 1

## 2021-05-31 MED ORDER — ASPIRIN 325 MG PO TABS
325.0000 mg | ORAL_TABLET | Freq: Once | ORAL | Status: AC
  Administered 2021-05-31: 18:00:00 325 mg via ORAL
  Filled 2021-05-31: qty 1

## 2021-05-31 MED ORDER — GLIPIZIDE 5 MG PO TABS
5.0000 mg | ORAL_TABLET | Freq: Every day | ORAL | Status: DC
  Filled 2021-05-31: qty 1

## 2021-05-31 MED ORDER — INSULIN ASPART 100 UNIT/ML IJ SOLN
0.0000 [IU] | INTRAMUSCULAR | Status: DC
  Administered 2021-05-31 – 2021-06-01 (×2): 4 [IU] via SUBCUTANEOUS
  Administered 2021-06-01 (×3): 3 [IU] via SUBCUTANEOUS
  Administered 2021-06-01: 7 [IU] via SUBCUTANEOUS
  Administered 2021-06-01: 3 [IU] via SUBCUTANEOUS
  Administered 2021-06-02: 7 [IU] via SUBCUTANEOUS
  Administered 2021-06-02: 3 [IU] via SUBCUTANEOUS

## 2021-05-31 MED ORDER — DILTIAZEM HCL-DEXTROSE 125-5 MG/125ML-% IV SOLN (PREMIX)
5.0000 mg/h | INTRAVENOUS | Status: DC
  Administered 2021-05-31: 18:00:00 5 mg/h via INTRAVENOUS
  Filled 2021-05-31: qty 125

## 2021-05-31 MED ORDER — TAMSULOSIN HCL 0.4 MG PO CAPS
0.4000 mg | ORAL_CAPSULE | Freq: Every day | ORAL | Status: DC
  Administered 2021-06-01 – 2021-06-03 (×3): 0.4 mg via ORAL
  Filled 2021-05-31 (×4): qty 1

## 2021-05-31 MED ORDER — DEXAMETHASONE SODIUM PHOSPHATE 10 MG/ML IJ SOLN
6.0000 mg | INTRAMUSCULAR | Status: AC
  Administered 2021-05-31 – 2021-06-02 (×3): 6 mg via INTRAVENOUS
  Filled 2021-05-31 (×2): qty 1

## 2021-05-31 MED ORDER — SODIUM CHLORIDE 0.9 % IV SOLN
200.0000 mg | Freq: Once | INTRAVENOUS | Status: AC
  Administered 2021-05-31: 200 mg via INTRAVENOUS
  Filled 2021-05-31: qty 40

## 2021-05-31 MED ORDER — DILTIAZEM HCL 25 MG/5ML IV SOLN
10.0000 mg | Freq: Once | INTRAVENOUS | Status: AC
  Administered 2021-05-31: 17:00:00 10 mg via INTRAVENOUS
  Filled 2021-05-31: qty 5

## 2021-05-31 MED ORDER — MAGNESIUM SULFATE 2 GM/50ML IV SOLN
2.0000 g | Freq: Once | INTRAVENOUS | Status: AC
  Administered 2021-05-31: 17:00:00 2 g via INTRAVENOUS
  Filled 2021-05-31: qty 50

## 2021-05-31 MED ORDER — METOPROLOL TARTRATE 25 MG PO TABS
25.0000 mg | ORAL_TABLET | Freq: Two times a day (BID) | ORAL | Status: DC
  Administered 2021-05-31 – 2021-06-04 (×5): 25 mg via ORAL
  Filled 2021-05-31 (×6): qty 1

## 2021-05-31 MED ORDER — ATORVASTATIN CALCIUM 80 MG PO TABS
80.0000 mg | ORAL_TABLET | Freq: Every day | ORAL | Status: DC
  Filled 2021-05-31: qty 1

## 2021-05-31 MED ORDER — SODIUM CHLORIDE 0.9% FLUSH
3.0000 mL | Freq: Two times a day (BID) | INTRAVENOUS | Status: DC
  Administered 2021-06-01 – 2021-06-14 (×26): 3 mL via INTRAVENOUS

## 2021-05-31 MED ORDER — SODIUM CHLORIDE 0.9 % IV SOLN
100.0000 mg | Freq: Every day | INTRAVENOUS | Status: DC
  Administered 2021-06-01: 100 mg via INTRAVENOUS
  Filled 2021-05-31 (×2): qty 20

## 2021-05-31 MED ORDER — AMIODARONE LOAD VIA INFUSION
150.0000 mg | Freq: Once | INTRAVENOUS | Status: AC
  Administered 2021-05-31: 20:00:00 150 mg via INTRAVENOUS
  Filled 2021-05-31: qty 83.34

## 2021-05-31 MED ORDER — ALBUTEROL SULFATE (2.5 MG/3ML) 0.083% IN NEBU
INHALATION_SOLUTION | RESPIRATORY_TRACT | Status: AC
  Filled 2021-05-31: qty 12

## 2021-05-31 MED ORDER — AMIODARONE HCL IN DEXTROSE 360-4.14 MG/200ML-% IV SOLN
60.0000 mg/h | INTRAVENOUS | Status: AC
  Administered 2021-05-31: 20:00:00 60 mg/h via INTRAVENOUS
  Filled 2021-05-31: qty 200

## 2021-05-31 MED ORDER — POLYETHYLENE GLYCOL 3350 17 G PO PACK
17.0000 g | PACK | Freq: Every day | ORAL | Status: DC | PRN
  Filled 2021-05-31 (×2): qty 1

## 2021-05-31 MED ORDER — IPRATROPIUM BROMIDE 0.02 % IN SOLN
1.0000 mg | Freq: Once | RESPIRATORY_TRACT | Status: DC
  Filled 2021-05-31: qty 5

## 2021-05-31 MED ORDER — FUROSEMIDE 10 MG/ML IJ SOLN
40.0000 mg | Freq: Once | INTRAMUSCULAR | Status: AC
  Administered 2021-05-31: 19:00:00 40 mg via INTRAVENOUS

## 2021-05-31 MED ORDER — FUROSEMIDE 10 MG/ML IJ SOLN
80.0000 mg | Freq: Two times a day (BID) | INTRAMUSCULAR | Status: DC
  Administered 2021-06-01 – 2021-06-04 (×6): 80 mg via INTRAVENOUS
  Filled 2021-05-31 (×6): qty 8

## 2021-05-31 MED ORDER — LEVOTHYROXINE SODIUM 100 MCG PO TABS
100.0000 ug | ORAL_TABLET | Freq: Every day | ORAL | Status: DC
  Administered 2021-06-01 – 2021-06-03 (×3): 100 ug via ORAL
  Filled 2021-05-31 (×4): qty 1

## 2021-05-31 MED ORDER — LINACLOTIDE 72 MCG PO CAPS
72.0000 ug | ORAL_CAPSULE | Freq: Every day | ORAL | Status: DC
  Administered 2021-06-01 – 2021-06-03 (×3): 72 ug via ORAL
  Filled 2021-05-31 (×6): qty 1

## 2021-05-31 MED ORDER — SERTRALINE HCL 100 MG PO TABS
100.0000 mg | ORAL_TABLET | Freq: Every day | ORAL | Status: DC
  Administered 2021-06-01 – 2021-06-03 (×3): 100 mg via ORAL
  Filled 2021-05-31 (×4): qty 1

## 2021-05-31 NOTE — H&P (Signed)
History and Physical   Richard Hall FGH:[id number] DOB: July 02, 1955 DOA: 05/31/2021  PCP: [name], MD   Patient coming from: Dialysis  Chief Complaint: Shortness of breath  HPI: Richard Hall is a 65 y.o. male with medical history significant of anemia, atrial fibrillation, CHF, ESRD on HD, chronic respiratory failure, diabetes, neuropathy, hyperlipidemia, hypertension, hypothyroidism, OSA, history of CVA, prostate cancer who presents from dialysis with shortness of breath.  Patient was at dialysis and did complete 4 hours of dialysis and became significantly short of breath.  Wears 2 L of oxygen at baseline has increased to 3 L.  For EMS he had significant increased work of breathing was placed on CPAP in route.  He was started on albuterol and Atrovent as well as a dose of Solu-Medrol for concern of possible COPD though I do not see this on his chart.  He then either went into A. fib following this or was already in A. fib with RVR and a rate of 120s to 150s.  Patient unable to give significant history as he is having communicated with BiPAP on.  I was able to reach his son by phone, who reported patient had been feeling okay on Monday but later Monday into Tuesday started having respiratory symptoms and congestion.  He also had an episode the last couple days where he had seem to had a low blood sugar episode and was found down.  Patient's congestive type symptoms only been going on for the last 3 to 4 days.  Patient denies chest pain, abdominal pain, constipation, diarrhea, nausea, vomiting.   ED Course: Vital signs in the ED significant for heart rate in the 130s to 140s.  Respiratory rate in 20s to 30s.  Saturations improved on BiPAP in the ED.  Lab work-up showed CMP with sodium 134, chloride 91, BUN 49, creatinine stable at 5, bicarb normal but gap of 16.  Calcium 7.8 which improved considering albumin of 3.0.  AST elevated to 463 and ALT elevated to 229.  CBC showed mild  leukocytosis to 12.1 and hemoglobin stable at 8.7.  Troponin elevated at 707 with repeat pending.  BNP elevated to greater than 1200.  Respiratory panel flu COVID pending.  Chest x-ray showed increased interstitial markings possible asymmetric pulmonary edema versus pneumonitis and cardiomegaly also noted.  Patient received aspirin, diltiazem drip and magnesium in the ED.  Cardiology was consulted and states elevated troponin is likely demand ischemia but they will evaluate the patient.  Nephrology was contacted and patient has been put on the list for dialysis tomorrow.  Review of Systems: As per HPI otherwise all other systems reviewed and are negative.  Past Medical History:  Diagnosis Date   A-V fistula ([place])    left arm   Anemia    Arthritis    CHF (congestive heart failure) (HCC)    CVA (cerebral vascular accident) ([place]) 2010   Diabetes mellitus with end-stage renal disease ([place])    Dyalisis pt.   Diabetic neuropathy (HCC)    Bilateral feet   Dyslipidemia    Dysrhythmia    A-fib   Essential hypertension    Hepatitis    childhood   History of blood transfusion    Hypothyroidism (acquired)    OSA on CPAP    Prostate cancer [facility])    Uses wheelchair     Past Surgical History:  Procedure Laterality Date   APPENDECTOMY     COLONOSCOPY     FEMUR IM NAIL Left  05/20/2019   Procedure: Intramedullary (Im) Retrograde Femoral Nailing for impending pathologic fracture;  Surgeon: Altamese [place], MD;  Location: [place];  Service: Orthopedics;  Laterality: Left;   ORIF FEMUR FRACTURE Left 05/20/2019   Procedure: OPEN REDUCTION INTERNAL FIXATION (ORIF) DISTAL FEMUR FRACTURE with intercondylar extension;  Surgeon: Altamese [place], MD;  Location: [place];  Service: Orthopedics;  Laterality: Left;   TRANSURETHRAL RESECTION OF BLADDER TUMOR N/A 01/07/2019   Procedure: CYSTOSCOPY/ EVEACUATION CLOT;  Surgeon: Bjorn Loser, MD;  Location: [facility];  Service: Urology;  Laterality: N/A;   TRANSURETHRAL  RESECTION OF PROSTATE N/A 07/26/2020   Procedure: TRANSURETHRAL RESECTION OF THE PROSTATE (TURP);  Surgeon: Alexis Frock, MD;  Location: [facility];  Service: Urology;  Laterality: N/A;  42 MINS    Social History  reports that he quit smoking about 26 years ago. He has never used smokeless tobacco. He reports that he does not drink alcohol and does not use drugs.  Allergies  Allergen Reactions   Gabapentin Swelling    Family History  Problem Relation Age of Onset   Cancer Mother   Reviewed on admission  Prior to Admission medications   Medication Sig Start Date End Date Taking? Authorizing Provider  acetaminophen (TYLENOL) 500 MG tablet Take 500-1,000 mg by mouth every 6 (six) hours as needed (for pain.).    [provider]  amitriptyline (ELAVIL) 100 MG tablet Take 1 tablet (100 mg total) by mouth at bedtime. 05/25/19   Mitzi Hansen, MD  atorvastatin (LIPITOR) 80 MG tablet Take 80 mg by mouth daily. 03/08/20   [provider]  EUTHYROX 88 MCG tablet Take 88 mcg by mouth daily before breakfast. 02/06/20   [provider]  ferric citrate (AURYXIA) 1 GM 210 MG(Fe) tablet Take 2 tablets (420 mg total) by mouth 3 (three) times daily with meals. Patient taking differently: Take 420 mg by mouth in the morning and at bedtime. 05/25/19   Mitzi Hansen, MD  fluticasone (FLONASE ALLERGY RELIEF) 50 MCG/ACT nasal spray Place 1 spray into both nostrils as needed. Patient taking differently: Place 1 spray into both nostrils daily as needed for allergies. 05/25/19   Mitzi Hansen, MD  furosemide (LASIX) 80 MG tablet Take 1 tablet (80 mg total) by mouth 4 (four) times a week. Mondays, wednesdays, fridays, and sundays Patient taking differently: Take 80 mg by mouth See admin instructions. Tues, Thurs, Sat and Sun 05/26/19   Christian, Rylee, MD  glipiZIDE (GLUCOTROL) 10 MG tablet Take 5 mg by mouth daily. 02/06/20   [provider]  insulin glargine, 1 Unit Dial,  (TOUJEO SOLOSTAR) 300 UNIT/ML Solostar Pen Inject 24 Units into the skin at bedtime.    [provider]  LEUPROLIDE ACETATE, 6 MONTH, 45 MG injection Inject 45 mg into the skin every 6 (six) months. 05/25/19   Mitzi Hansen, MD  lidocaine-prilocaine (EMLA) cream Apply 1 application topically See admin instructions. Apply small amount to access site 1-2 hours before dialysis, cover with occlusive dressing (saran wrap) 05/25/19   Mitzi Hansen, MD  LINZESS 72 MCG capsule Take 1 capsule (72 mcg total) by mouth as needed. Patient taking differently: Take 72 mcg by mouth daily as needed (constipation.). 05/25/19   Mitzi Hansen, MD  metoprolol tartrate (LOPRESSOR) 25 MG tablet Take 25 mg by mouth 2 (two) times daily. 02/06/20   [provider]  sertraline (ZOLOFT) 100 MG tablet Take 100 mg by mouth daily. 02/06/20   [provider]  traMADol (ULTRAM) 50 MG tablet  Take 1-2 tablets (50-100 mg total) by mouth every 6 (six) hours as needed for moderate pain or severe pain. Post-operatively 07/27/20 07/27/21  Alexis Frock, MD  XTANDI 40 MG capsule Take 160 mg by mouth at bedtime. 03/12/20   [provider]    Physical Exam: Vitals:   05/31/21 1650 05/31/21 1715 05/31/21 1745 05/31/21 1817  BP:    120/87  Pulse:  (!) 148 (!) 127 (!) 136  Resp:  (!) 27 (!) 33 (!) 25  Temp:      TempSrc:      SpO2: 100% 100% 100% 99%   Physical Exam Constitutional:      Comments: Ill-appearing, obese male.  In mild distress but more comfortable now on BiPAP  HENT:     Head: Normocephalic and atraumatic.     Mouth/Throat:     Mouth: Mucous membranes are moist.     Pharynx: Oropharynx is clear.  Eyes:     Extraocular Movements: Extraocular movements intact.     Pupils: Pupils are equal, round, and reactive to light.  Cardiovascular:     Rate and Rhythm: Tachycardia present. Rhythm irregular.     Pulses: Normal pulses.     Heart sounds: Normal heart sounds.  Pulmonary:      Effort: Pulmonary effort is normal. No respiratory distress.     Breath sounds: Rales present.     Comments: Mechanical sounds from BiPAP Abdominal:     General: Bowel sounds are normal. There is no distension.     Palpations: Abdomen is soft.     Tenderness: There is no abdominal tenderness.  Musculoskeletal:        General: No swelling or deformity.     Right lower leg: Edema present.     Left lower leg: Edema present.  Skin:    General: Skin is warm and dry.  Neurological:     General: No focal deficit present.     Mental Status: Mental status is at baseline.   Labs on Admission: I have personally reviewed following labs and imaging studies  CBC: Recent Labs  Lab 05/31/21 1700 05/31/21 1717  WBC 12.1*  --   NEUTROABS 10.1*  --   HGB 8.7* 9.9*   9.9*  HCT 27.2* 29.0*   29.0*  MCV 91.0  --   PLT 269  --     Basic Metabolic Panel: Recent Labs  Lab 05/31/21 1700 05/31/21 1717  NA 134* 132*   133*  K 3.9 3.8   3.8  CL 91* 91*  CO2 27  --   GLUCOSE 93 88  BUN 49* 52*  CREATININE 5.09* 5.10*  CALCIUM 7.8*  --     GFR: CrCl cannot be calculated (Unknown ideal weight.).  Liver Function Tests: Recent Labs  Lab 05/31/21 1700  AST 463*  ALT 229*  ALKPHOS 112  BILITOT 0.4  PROT 7.7  ALBUMIN 3.0*    Urine analysis:    Component Value Date/Time   COLORURINE RED (A) 01/06/2019 1118   APPEARANCEUR TURBID (A) 01/06/2019 1118   LABSPEC  01/06/2019 1118    TEST NOT REPORTED DUE TO COLOR INTERFERENCE OF URINE PIGMENT   PHURINE  01/06/2019 1118    TEST NOT REPORTED DUE TO COLOR INTERFERENCE OF URINE PIGMENT   GLUCOSEU (A) 01/06/2019 1118    TEST NOT REPORTED DUE TO COLOR INTERFERENCE OF URINE PIGMENT   HGBUR (A) 01/06/2019 1118    TEST NOT REPORTED DUE TO COLOR INTERFERENCE OF URINE PIGMENT  BILIRUBINUR (A) 01/06/2019 1118    TEST NOT REPORTED DUE TO COLOR INTERFERENCE OF URINE PIGMENT   KETONESUR (A) 01/06/2019 1118    TEST NOT REPORTED DUE TO COLOR  INTERFERENCE OF URINE PIGMENT   PROTEINUR (A) 01/06/2019 1118    TEST NOT REPORTED DUE TO COLOR INTERFERENCE OF URINE PIGMENT   NITRITE (A) 01/06/2019 1118    TEST NOT REPORTED DUE TO COLOR INTERFERENCE OF URINE PIGMENT   LEUKOCYTESUR (A) 01/06/2019 1118    TEST NOT REPORTED DUE TO COLOR INTERFERENCE OF URINE PIGMENT    Radiological Exams on Admission: DG Chest Port 1 View  Result Date: 05/31/2021 CLINICAL DATA:  Shortness of breath EXAM: PORTABLE CHEST 1 VIEW COMPARISON:  01/05/2019 FINDINGS: Transverse diameter of heart is increased. Increased interstitial markings are seen in the right parahilar region and right lower lung fields. There is no focal consolidation. Lateral CP angles are not included in their entirety. There is no significant pleural effusion or pneumothorax. IMPRESSION: Cardiomegaly. Increased interstitial markings are seen in the right parahilar region and right lower lung fields. This may suggest asymmetric pulmonary edema or interstitial pneumonitis. Electronically Signed   By: Elmer Picker M.D.   On: 05/31/2021 17:09    EKG: Independently reviewed.  Atrial fibrillation with RVR at 155 bpm.  Some baseline artifact.  Assessment/Plan Principal Problem:   Atrial fibrillation with RVR (HCC) Active Problems:   OSA on CPAP   Hypothyroidism (acquired)   Essential hypertension   Dyslipidemia   Diabetes mellitus with end-stage renal disease ([place])   ESRD (end stage renal disease) (HCC)   Personal history of transient ischemic attack (TIA), and cerebral infarction without residual deficits   Acute on chronic respiratory failure with hypoxia (HCC)   CHF exacerbation (HCC)  Acute on chronic respiratory failure with hypoxia CHF exacerbation Atrial fibrillation with RVR Volume overload COVID-19 infection > Patient presenting with acute respiratory failure/shortness of breath from dialysis.  In route found to be in A. fib with RVR was treated initially for COPD however  presentation now appears to be more volume overload/A. fib with RVR induced CHF exacerbation. > Was placed on CPAP in route by EMS and BiPAP in the ED.  Saturating well on BiPAP with pH mildly elevated at 7.449 and normal PCO2 at 46.5. > Placed on diltiazem drip in the ED.  Dose of Lasix has been ordered in the ED as well. > Cardiology consulted as troponin was elevated to 707 likely demand in setting of above.  But they will evaluate patient as well. > EDP states he spoke with nephrology and patient is on the list for dialysis tomorrow. > Outside records show recent echo status post watchman procedure for A. fib showed EF 50-55% - Monitor on progressive unit - Impression cardiology recommendations - Transition to amiodarone bolus and gtt from diltiazem given CHF and not currently responding to diltiazem drip.  Discussed with cardiology. - Continue with Lasix twice daily until able to get dialysis. - Strict I's and O's/daily weights - We will consider echocardiogram as patient has LFT elevations raising possibility for a degree of shock liver or congestive hepatopathy and we do not have one in our system, patient also tested positive for COVID-19 which could explain his elevation. - Trend renal function and electrolytes - Continue home Eliquis, metoprolol  - n.p.o. while on BiPAP, resume oral medications when able to come off BiPAP Addendum > As above patient tested positive for COVID-19 in ED - We will continue with steroids  for COVID-19 (Received Solu-Medrol in route by [facility] remdesivir considering his symptoms have only been ongoing for 3 to 4 days. -Will trend daily CMP, CBC, CRP, D-dimer, ferritin, magnesium, phosphorus  Diabetes > Receives 20 units nightly at home and oral medications - SSI while NPO, low normal glucose in ED  ESRD > On dialysis Monday Wednesday Friday.  Completed 4 hours of dialysis today.  Electrolytes are stable but is volume overloaded. > Nephrology consulted by  EDP who put patient on list for dialysis tomorrow - Treating with Lasix as above for now - Dialysis tomorrow  Transaminitis > Noted to have new AST and ALT elevation to 463 and 229 respectively.  Other LFTs are normal.  Possibility of shock liver in the setting of volume overload A. fib and CHF exacerbation as above.  Will rule out hepatitis. - Treat CHF as above - Follow-up hepatitis panel [place] -Patient came back COVID-positive this is now most likely etiology for transaminitis.  Also cardiologist performed bedside ultrasound which showed EF intact making shock liver less likely.  Him  Anemia > History of anemia of chronic kidney disease > Hemoglobin stable at 8.7 in the ED - Trend hemoglobin  Hypothyroidism - Continue home Synthroid  Prostate cancer - Working to verify his p.o. medications for this, will wait on pharmacy verification  - On Xtandi versus bicalutamide, also history of leuprolide injections - Continue home tamsulosin  Hypertension - Continue home metoprolol, hydralazine when able  Hyperlipidemia History of CVA - Continue home atorvastatin  OSA - On BiPAP  DVT prophylaxis: Eliquis  Code Status:   Full  Family Communication:  Discussed with son by phone.  He would like updates with any significant events. Disposition Plan:   Patient is from:  Home  Anticipated DC to:  Home  Anticipated DC date:  1 to 4 days  Anticipated DC barriers: None  Consults called:  Cardiology consulted by EDP and will see the patient.  Nephrology contacted by EDP patient been placed on list for dialysis.  Admission status:  Observation, progressive   Severity of Illness: The appropriate patient status for this patient is OBSERVATION. Observation status is judged to be reasonable and necessary in order to provide the required intensity of service to ensure the patient's safety. The patient's presenting symptoms, physical exam findings, and initial radiographic and laboratory data  in the context of their medical condition is felt to place them at decreased risk for further clinical deterioration. Furthermore, it is anticipated that the patient will be medically stable for discharge from the hospital within 2 midnights of admission.    Marcelyn Bruins MD [facility]  How to contact the [facility] Attending or Consulting provider [place] or covering provider during after hours [place], for this patient?   Check the care team in [facility] and look for a) attending/consulting [facility] provider listed and b) the [facility] team listed Log into [email or web address] and use [place]'s universal password to access. If you do not have the password, please contact the hospital operator. Locate the [facility] provider you are looking for under [facility] and page to a number that you can be directly reached. If you still have difficulty reaching the provider, please page the [facility] (Director on Call) for the Hospitalists listed on amion for assistance.  05/31/2021, 7:30 PM

## 2021-05-31 NOTE — Consult Note (Signed)
Cardiology Consultation:   Patient ID: Richard Hall MRN: [id number]; DOB: [birth year]  Admit date: 05/31/2021 Date of Consult: 05/31/2021  PCP:  [name], MD   [facility] Providers Cardiologist:  None    Patient sees Dr. Georg Ruddle in [place]   Patient Profile:   Richard Hall is a 65 y.o. male with a hx of PMH including but no limited to paroxysmal afib anticoagulated on Eliquis, chronic diastolic CHF on home O2 2 lpm, HTN, prior CVA, ESRD on HD, recurrent GIB, and chronic anemia getting iron infusions, Watchman device implanted 04/18/2021, who is being seen 05/31/2021 for the evaluation of rapid atrial fib, at the request of Dr Darl Householder.  History of Present Illness:   [name] is followed by Dr Georg Ruddle in [facility] for cardiology, and had a Watchman device implanted 11/17.   He lives in [place] and [place] in [place]. He had been at HD today x 3 hours and had onset of severe SOB. His O2 requirements were increased >> EMS called. They gave him albuterol neb and Solu-medrol en route to hospital.   [name] says that he had completed dialysis when his shortness of breath became so bad.  He is currently on BiPAP and is breathing a little bit better, but still seems short of breath and do not think he would do well if he was taken off of the BiPAP.  He has no awareness of the atrial fibrillation and says he has no idea how often or when he is in it.  He does not think he was in atrial fibrillation when he went to dialysis today, but he is not sure.  He has not had chest pain.  He does not think he has significant dietary indiscretions or drink extra fluid prior to his dialysis session today.   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   Diabetic neuropathy  (HCC)    Bilateral feet   Dyslipidemia    Dysrhythmia    A-fib   Essential hypertension    Hepatitis    childhood   History of blood transfusion    Hypothyroidism (acquired)    OSA on CPAP    Prostate cancer [facility])    Uses wheelchair     Past Surgical History:  Procedure Laterality Date   APPENDECTOMY     COLONOSCOPY     FEMUR IM NAIL Left 05/20/2019   Procedure: Intramedullary (Im) Retrograde Femoral Nailing for impending pathologic fracture;  Surgeon: Altamese [place], MD;  Location: [place];  Service: Orthopedics;  Laterality: Left;   ORIF FEMUR FRACTURE Left 05/20/2019   Procedure: OPEN REDUCTION INTERNAL FIXATION (ORIF) DISTAL FEMUR FRACTURE with intercondylar extension;  Surgeon: Altamese [place], MD;  Location: [place];  Service: Orthopedics;  Laterality: Left;   TRANSURETHRAL RESECTION OF BLADDER TUMOR N/A 01/07/2019   Procedure: CYSTOSCOPY/ EVEACUATION CLOT;  Surgeon: Bjorn Loser, MD;  Location: [facility];  Service: Urology;  Laterality: N/A;   TRANSURETHRAL RESECTION OF PROSTATE N/A 07/26/2020   Procedure: TRANSURETHRAL RESECTION OF THE PROSTATE (TURP);  Surgeon: Alexis Frock, MD;  Location: [facility];  Service: Urology;  Laterality: N/A;  75 MINS     Home Medications:  Prior to Admission medications   Medication Sig  Start Date End Date Taking? Authorizing Provider  acetaminophen (TYLENOL) 500 MG tablet Take 500-1,000 mg by mouth every 6 (six) hours as needed (for pain.).    [provider]  amitriptyline (ELAVIL) 100 MG tablet Take 1 tablet (100 mg total) by mouth at bedtime. 05/25/19   Mitzi Hansen, MD  atorvastatin (LIPITOR) 80 MG tablet Take 80 mg by mouth daily. 03/08/20   [provider]  EUTHYROX 88 MCG tablet Take 88 mcg by mouth daily before breakfast. 02/06/20   [provider]  ferric citrate (AURYXIA) 1 GM 210 MG(Fe) tablet Take 2 tablets (420 mg total) by mouth 3 (three) times daily with meals. Patient taking differently: Take 420 mg by  mouth in the morning and at bedtime. 05/25/19   Mitzi Hansen, MD  fluticasone (FLONASE ALLERGY RELIEF) 50 MCG/ACT nasal spray Place 1 spray into both nostrils as needed. Patient taking differently: Place 1 spray into both nostrils daily as needed for allergies. 05/25/19   Mitzi Hansen, MD  furosemide (LASIX) 80 MG tablet Take 1 tablet (80 mg total) by mouth 4 (four) times a week. Mondays, wednesdays, fridays, and sundays Patient taking differently: Take 80 mg by mouth See admin instructions. Tues, Thurs, Sat and Sun 05/26/19   Christian, Rylee, MD  glipiZIDE (GLUCOTROL) 10 MG tablet Take 5 mg by mouth daily. 02/06/20   [provider]  insulin glargine, 1 Unit Dial, (TOUJEO SOLOSTAR) 300 UNIT/ML Solostar Pen Inject 24 Units into the skin at bedtime.    [provider]  LEUPROLIDE ACETATE, 6 MONTH, 45 MG injection Inject 45 mg into the skin every 6 (six) months. 05/25/19   Mitzi Hansen, MD  lidocaine-prilocaine (EMLA) cream Apply 1 application topically See admin instructions. Apply small amount to access site 1-2 hours before dialysis, cover with occlusive dressing (saran wrap) 05/25/19   Mitzi Hansen, MD  LINZESS 72 MCG capsule Take 1 capsule (72 mcg total) by mouth as needed. Patient taking differently: Take 72 mcg by mouth daily as needed (constipation.). 05/25/19   Mitzi Hansen, MD  metoprolol tartrate (LOPRESSOR) 25 MG tablet Take 25 mg by mouth 2 (two) times daily. 02/06/20   [provider]  sertraline (ZOLOFT) 100 MG tablet Take 100 mg by mouth daily. 02/06/20   [provider]  traMADol (ULTRAM) 50 MG tablet Take 1-2 tablets (50-100 mg total) by mouth every 6 (six) hours as needed for moderate pain or severe pain. Post-operatively 07/27/20 07/27/21  Alexis Frock, MD  XTANDI 40 MG capsule Take 160 mg by mouth at bedtime. 03/12/20   [provider]    Inpatient Medications: Scheduled Meds:  amiodarone  150 mg Intravenous Once    apixaban  2.5 mg Oral BID   [START ON 06/01/2021] atorvastatin  80 mg Oral Daily   furosemide  40 mg Intravenous Once   [START ON 06/01/2021] furosemide  80 mg Intravenous BID   glipiZIDE  5 mg Oral Daily   insulin aspart  0-20 Units Subcutaneous Q4H   [START ON 06/01/2021] levothyroxine  100 mcg Oral QAC breakfast   metoprolol tartrate  25 mg Oral BID   [START ON 06/01/2021] sertraline  100 mg Oral Daily   sodium chloride flush  3 mL Intravenous Q12H   [START ON 06/01/2021] tamsulosin  0.4 mg Oral Daily   Continuous Infusions:  amiodarone     Followed by   Derrill Memo ON 06/01/2021] amiodarone     PRN Meds:   Allergies:    Allergies  Allergen Reactions  Gabapentin Swelling    Social History:   Social History   Socioeconomic History   Marital status: Single    Spouse name: Not on file   Number of children: Not on file   Years of education: Not on file   Highest education level: Not on file  Occupational History   Occupation: retired  Tobacco Use   Smoking status: Former    Types: Cigarettes    Quit date: 1997    Years since quitting: 26.0   Smokeless tobacco: Never  Vaping Use   Vaping Use: Never used  Substance and Sexual Activity   Alcohol use: Never   Drug use: Never   Sexual activity: Not on file  Other Topics Concern   Not on file  Social History Narrative   Not on file   Social Determinants of Health   Financial Resource Strain: Not on file  Food Insecurity: Not on file  Transportation Needs: Not on file  Physical Activity: Not on file  Stress: Not on file  Social Connections: Not on file  Intimate Partner Violence: Not on file    Family History:     Cancer Mother  lung cancer   Heart disease Sister   Stroke Sister   Heart disease Brother   Stroke Brother   Diabetes Brother   Diabetes Sister   Colon cancer Neg Hx   Colon polyps Neg Hx    ROS:  Please see the history of present illness.  All other ROS reviewed and negative.     Physical  Exam/Data:   Vitals:   05/31/21 1650 05/31/21 1715 05/31/21 1745 05/31/21 1817  BP:    120/87  Pulse:  (!) 148 (!) 127 (!) 136  Resp:  (!) 27 (!) 33 (!) 25  Temp:      TempSrc:      SpO2: 100% 100% 100% 99%   No intake or output data in the 24 hours ending 05/31/21 1922 Last 3 Weights 07/26/2020 07/26/2020 07/24/2020  Weight (lbs) 276 lb 3.2 oz 268 lb 268 lb  Weight (kg) 125.283 kg 121.564 kg 121.564 kg     There is no height or weight on file to calculate BMI.  General:  Well nourished, well developed, in respiratory distress HEENT: normal Neck: no JVD seen, difficult to assess secondary to body habitus and equipment Vascular: No carotid bruits; Distal pulses 2+ bilaterally Cardiac:  normal S1, S2; rapid and irregular rate and rhythm; no murmur  Lungs: Scattered wheezing and rails to auscultation bilaterally, no rhonchi Abd: Obese, firm, nontender, no hepatomegaly  Ext: no edema Musculoskeletal:  No deformities, BUE and BLE strength weak but equal Skin: warm and dry  Neuro:  CNs 2-12 intact, no focal abnormalities noted Psych:  Normal affect   EKG:  The EKG was personally reviewed and demonstrates: Atrial fibrillation, RVR, heart rate 155 Telemetry:  Telemetry was personally reviewed and demonstrates: Atrial fibrillation, heart rate improved but still not well controlled on IV Cardizem at 15 mg/h  Relevant CV Studies:  ECHO: LIMITED 04/18/2021 grossly normal    Watchman device well seated in the left atrial appendage.  Trivial pericardial effusion.  ECHO: Intracardiac, 04/18/2021 This result has an attachment that is not available.  Successful ICE guided placement of Watchman device.  Aortic Valve  The aortic valve is tricuspid. The leaflets exhibit probably normal excursion. The leaflets are severely calcified.   ECHO: Complete, 04/10/2020 Impression  Left Ventricle: Systolic function is normal. EF: 65-70%.    Left  Ventricle: Doppler parameters consistent with mild  diastolic  dysfunction and low to normal LA pressure.    Mitral Valve: There is mild regurgitation. Narrative  This result has an attachment that is not available.    Left Ventricle  Mildly dilated left ventricular size. The calculated left ventricular ejection fraction is 72%. There is sigmoid interventricular septum. Systolic function is normal. EF: 65-70%. Wall motion is normal. Doppler parameters consistent with mild diastolic dysfunction and low to normal LA pressure.   Right Ventricle  Right ventricle appears normal. Systolic function is normal.   Left Atrium  Left atrium is moderately dilated.   Right Atrium  Normal sized right atrium.   IVC/SVC  The inferior vena cava demonstrates a diameter of <=2.1 cm and collapses >50%; therefore, the right atrial pressure is estimated at 3 mmHg.   Mitral Valve  Mitral valve structure is normal. There is mild regurgitation.   Tricuspid Valve  Tricuspid valve structure is normal. There is trace regurgitation. The right ventricular systolic pressure is normal (<36 mmHg).   Aortic Valve  The aortic valve is tricuspid. The leaflets are mildly calcified.   Pulmonic Valve  The pulmonic valve was not well visualized. Trace regurgitation.   Ascending Aorta  The aortic root is mildly dilated.   Pericardium  There is no pericardial effusion.   Study Details  A complete echo was performed using complete 2D, color flow Doppler and spectral Doppler. Patient has normal sinus rhythm. Overall the study quality was adequate. The study was technically difficult. No change noted when compared to the previous study performed on 10/15/2017.   Wall Scoring Baseline  Score Index: 1.00  The left ventricular wall motion is normal.  Laboratory Data:  High Sensitivity Troponin:   Recent Labs  Lab 05/31/21 1700  TROPONINIHS 707*     Chemistry Recent Labs  Lab 05/31/21 1700 05/31/21 1717  NA 134* 132*   133*  K 3.9 3.8   3.8  CL 91* 91*   CO2 27  --   GLUCOSE 93 88  BUN 49* 52*  CREATININE 5.09* 5.10*  CALCIUM 7.8*  --   GFRNONAA 12*  --   ANIONGAP 16*  --     Recent Labs  Lab 05/31/21 1700  PROT 7.7  ALBUMIN 3.0*  AST 463*  ALT 229*  ALKPHOS 112  BILITOT 0.4   Lipids No results for input(s): CHOL, TRIG, HDL, LABVLDL, LDLCALC, CHOLHDL in the last 168 hours.  Hematology Recent Labs  Lab 05/31/21 1700 05/31/21 1717  WBC 12.1*  --   RBC 2.99*  --   HGB 8.7* 9.9*   9.9*  HCT 27.2* 29.0*   29.0*  MCV 91.0  --   MCH 29.1  --   MCHC 32.0  --   RDW 18.2*  --   PLT 269  --    Thyroid No results for input(s): TSH, FREET4 in the last 168 hours.  BNP Recent Labs  Lab 05/31/21 1701  BNP 1,212.6*    DDimer No results for input(s): DDIMER in the last 168 hours.   Radiology/Studies:  DG Chest Port 1 View  Result Date: 05/31/2021 CLINICAL DATA:  Shortness of breath EXAM: PORTABLE CHEST 1 VIEW COMPARISON:  01/05/2019 FINDINGS: Transverse diameter of heart is increased. Increased interstitial markings are seen in the right parahilar region and right lower lung fields. There is no focal consolidation. Lateral CP angles are not included in their entirety. There is no significant pleural effusion or pneumothorax. IMPRESSION:  Cardiomegaly. Increased interstitial markings are seen in the right parahilar region and right lower lung fields. This may suggest asymmetric pulmonary edema or interstitial pneumonitis. Electronically Signed   By: Elmer Picker M.D.   On: 05/31/2021 17:09     Assessment and Plan:   Atrial fibrillation, RVR -He has had some rate control with IV Cardizem, but his heart rate is still generally greater than 120 on 15 mg/h of Cardizem IV - Spoke with Dr. Quentin Ore, start IV amiodarone and follow heart rate and blood pressure - S/p Watchman device and is now off Eliquis - MD advise if full anticoagulation is needed  2.  Shortness of breath -he is tolerating the BiPAP well, but still has  increased work of breathing and increased respiratory rate. -He still makes some urine - He got 40 mg of IV Lasix by the ER MD, and was given another dose of this at the recommendation of Dr. Quentin Ore -If he has a good response to this, we can continue it -However, the most definitive management of this may be additional dialysis per Nephrology  Management of his dyspnea and other issues per IM   Risk Assessment/Risk Scores:       New York Heart Association (NYHA) Functional Class NYHA Class IV  CHA2DS2-VASc Score = 5   This indicates a 7.2% annual risk of stroke. The patient's score is based upon: CHF History: 1 HTN History: 1 Diabetes History: 1 Stroke History: 0 Vascular Disease History: 1 Age Score: 1 Gender Score: 0    For questions or updates, please contact [facility] Please consult [email or web address] for contact info under    Signed, Rosaria Ferries, PA-C  05/31/2021 7:22 PM

## 2021-05-31 NOTE — Progress Notes (Signed)
UPDATE > Notified would be unable to be accepted at progressive care unit due to his BiPAP use and COVID positivity and needing a close system/ventilator. - Will admit patient to the ICU to remain on our service while he is weaning off of BiPAP. - Discussed with ICU provider to let them know he be going to the ICU and said that they would be aware of patient did not request formal consult at this time.,  Will be able to reach out at any time for formal consult. - We will attempt to wean patient to high flow nasal cannula as able and he will no longer require ICU if able to tolerate this.  However considering his COVID could be a primary driver in his respiratory failure we will keep him in the ICU in case the ability to wean off of BiPAP is transient and because he will need CPAP/BiPAP at night.  Pearson Grippe, DO [facility]

## 2021-05-31 NOTE — ED Notes (Signed)
Admitting & Cards at bedside

## 2021-05-31 NOTE — ED Notes (Signed)
Son Herschel Fleagle [phone] would like an update

## 2021-05-31 NOTE — ED Provider Notes (Addendum)
[facility] EMERGENCY DEPARTMENT Provider Note   CSN: [id number] Arrival date & time: 05/31/21  1633     History No chief complaint on file.   Richard Hall is a 65 y.o. male history of A. fib on Eliquis, ESRD on dialysis here presenting with shortness of breath.  Patient was at dialysis and had acute onset of shortness of breath.  He finished about 3 hours of dialysis.  Patient was noted to be tachypneic and has increased work of breathing.  Patient was given Solu-Medrol and albuterol prior to arrival.  Patient was noted to be tachycardic with a heart rate of 140s upon arrival to the ED. denies any fevers.  Patient has no chest pain but just feels very short of breath  The history is provided by the patient.      Past Medical History:  Diagnosis Date   A-V fistula ([place])    left arm   Anemia    Arthritis    CHF (congestive heart failure) (HCC)    CVA (cerebral vascular accident) ([place]) 2010   Diabetes mellitus with end-stage renal disease ([place])    Dyalisis pt.   Diabetic neuropathy ([place])    Bilateral feet   Dyslipidemia    Dysrhythmia    A-fib   Essential hypertension    Hepatitis    childhood   History of blood transfusion    Hypothyroidism (acquired)    OSA on CPAP    Prostate cancer ([place])    Uses wheelchair     Patient Active Problem List   Diagnosis Date Noted   Urinary retention 07/26/2020   Prostate cancer metastatic to bone ([place]) 05/18/2019   Fracture 05/17/2019   Hypocalcemia 05/14/2019   Bilateral leg edema 01/28/2019   Symptomatic anemia 01/05/2019   Gross hematuria 01/05/2019   Gastrointestinal hemorrhage with melena 01/05/2019   OSA on CPAP    Hypothyroidism (acquired)    Essential hypertension    Dyslipidemia    Diabetes mellitus with end-stage renal disease (HCC)    Acute blood loss anemia    ESRD on hemodialysis ([place]) 08/13/2018   ESRF (end stage renal failure) ([place]) 06/07/2018   Chronic diastolic congestive heart  failure ([place]) 02/09/2018   Anemia, chronic renal failure, stage 4 (severe) ([place]) 11/09/2017   Chronic respiratory failure with hypoxia and hypercapnia ([place]) 11/09/2017   Iron deficiency anemia 10/28/2017   Atrial fibrillation ([place]) 10/07/2017   Morbid obesity with BMI of 40.0-44.9, adult ([place]) 07/14/2016   Diabetic peripheral neuropathy associated with type 2 diabetes mellitus ([place]) 10/10/2015   Allergy 04/30/2015   Bronchitis 04/30/2015   ED (erectile dysfunction) of organic origin 04/30/2015   Lipoma of back 04/30/2015   Microalbuminuria 04/30/2015   Osteoarthritis 04/30/2015   Microalbuminuria due to type 2 diabetes mellitus ([place]) 01/09/2014   Chronic kidney disease (CKD), stage III (moderate) ([place]) 07/22/2013   Acute ill-defined cerebrovascular disease 04/04/2013   Dizziness 04/04/2013   Hypercholesterolemia 04/04/2013   Hypoxemia 04/04/2013   Insomnia 04/04/2013   Periodic limb movement disorder 04/04/2013   Diplopia 06/07/2012   Personal history of transient ischemic attack (TIA), and cerebral infarction without residual deficits 05/19/2011    Past Surgical History:  Procedure Laterality Date   APPENDECTOMY     COLONOSCOPY     FEMUR IM NAIL Left 05/20/2019   Procedure: Intramedullary (Im) Retrograde Femoral Nailing for impending pathologic fracture;  Surgeon: Altamese Carthage, MD;  Location: [facility];  Service: Orthopedics;  Laterality: Left;   ORIF FEMUR  FRACTURE Left 05/20/2019   Procedure: OPEN REDUCTION INTERNAL FIXATION (ORIF) DISTAL FEMUR FRACTURE with intercondylar extension;  Surgeon: Altamese Blodgett, MD;  Location: [facility];  Service: Orthopedics;  Laterality: Left;   TRANSURETHRAL RESECTION OF BLADDER TUMOR N/A 01/07/2019   Procedure: CYSTOSCOPY/ EVEACUATION CLOT;  Surgeon: Bjorn Loser, MD;  Location: [facility];  Service: Urology;  Laterality: N/A;   TRANSURETHRAL RESECTION OF PROSTATE N/A 07/26/2020   Procedure: TRANSURETHRAL RESECTION OF THE PROSTATE (TURP);  Surgeon:  Alexis Frock, MD;  Location: [facility];  Service: Urology;  Laterality: N/A;  2 MINS       No family history on file.  Social History   Tobacco Use   Smoking status: Former    Types: Cigarettes    Quit date: 1997    Years since quitting: 26.0   Smokeless tobacco: Never  Vaping Use   Vaping Use: Never used  Substance Use Topics   Alcohol use: Never   Drug use: Never    Home Medications Prior to Admission medications   Medication Sig Start Date End Date Taking? Authorizing Provider  acetaminophen (TYLENOL) 500 MG tablet Take 500-1,000 mg by mouth every 6 (six) hours as needed (for pain.).    [provider]  amitriptyline (ELAVIL) 100 MG tablet Take 1 tablet (100 mg total) by mouth at bedtime. 05/25/19   Mitzi Hansen, MD  atorvastatin (LIPITOR) 80 MG tablet Take 80 mg by mouth daily. 03/08/20   [provider]  EUTHYROX 88 MCG tablet Take 88 mcg by mouth daily before breakfast. 02/06/20   [provider]  ferric citrate (AURYXIA) 1 GM 210 MG(Fe) tablet Take 2 tablets (420 mg total) by mouth 3 (three) times daily with meals. Patient taking differently: Take 420 mg by mouth in the morning and at bedtime. 05/25/19   Mitzi Hansen, MD  fluticasone (FLONASE ALLERGY RELIEF) 50 MCG/ACT nasal spray Place 1 spray into both nostrils as needed. Patient taking differently: Place 1 spray into both nostrils daily as needed for allergies. 05/25/19   Mitzi Hansen, MD  furosemide (LASIX) 80 MG tablet Take 1 tablet (80 mg total) by mouth 4 (four) times a week. Mondays, wednesdays, fridays, and sundays Patient taking differently: Take 80 mg by mouth See admin instructions. Tues, Thurs, Sat and Sun 05/26/19   Christian, Rylee, MD  glipiZIDE (GLUCOTROL) 10 MG tablet Take 5 mg by mouth daily. 02/06/20   [provider]  insulin glargine, 1 Unit Dial, (TOUJEO SOLOSTAR) 300 UNIT/ML Solostar Pen Inject 24 Units into the skin at bedtime.    [provider]   LEUPROLIDE ACETATE, 6 MONTH, 45 MG injection Inject 45 mg into the skin every 6 (six) months. 05/25/19   Mitzi Hansen, MD  lidocaine-prilocaine (EMLA) cream Apply 1 application topically See admin instructions. Apply small amount to access site 1-2 hours before dialysis, cover with occlusive dressing (saran wrap) 05/25/19   Mitzi Hansen, MD  LINZESS 72 MCG capsule Take 1 capsule (72 mcg total) by mouth as needed. Patient taking differently: Take 72 mcg by mouth daily as needed (constipation.). 05/25/19   Mitzi Hansen, MD  metoprolol tartrate (LOPRESSOR) 25 MG tablet Take 25 mg by mouth 2 (two) times daily. 02/06/20   [provider]  sertraline (ZOLOFT) 100 MG tablet Take 100 mg by mouth daily. 02/06/20   [provider]  traMADol (ULTRAM) 50 MG tablet Take 1-2 tablets (50-100 mg total) by mouth every 6 (six) hours as needed for moderate pain or severe pain. Post-operatively 07/27/20  07/27/21  Alexis Frock, MD  XTANDI 40 MG capsule Take 160 mg by mouth at bedtime. 03/12/20   [provider]    Allergies    Gabapentin  Review of Systems   Review of Systems  Respiratory:  Positive for shortness of breath.   Cardiovascular:  Positive for palpitations.  All other systems reviewed and are negative.  Physical Exam Updated Vital Signs BP 133/86    Pulse (!) 127    Temp 98.2 F (36.8 C) (Temporal)    Resp (!) 33    SpO2 100%   Physical Exam Vitals and nursing note reviewed.  Constitutional:      Comments: Tachypneic, mild wheezing  HENT:     Head: Normocephalic.     Nose: Nose normal.     Mouth/Throat:     Mouth: Mucous membranes are moist.  Eyes:     Extraocular Movements: Extraocular movements intact.     Pupils: Pupils are equal, round, and reactive to light.  Cardiovascular:     Rate and Rhythm: Tachycardia present. Rhythm irregular.  Pulmonary:     Comments: Tachypneic, crackles bilateral bases  Abdominal:     General: Abdomen is flat.      Palpations: Abdomen is soft.  Musculoskeletal:        General: Normal range of motion.     Cervical back: Normal range of motion and neck supple.     Comments: 1+ edema bilateral  Skin:    General: Skin is warm.     Capillary Refill: Capillary refill takes less than 2 seconds.  Neurological:     General: No focal deficit present.     Mental Status: He is oriented to person, place, and time.  Psychiatric:        Mood and Affect: Mood normal.        Behavior: Behavior normal.    ED Results / Procedures / Treatments   Labs (all labs ordered are listed, but only abnormal results are displayed) Labs Reviewed  CBC WITH DIFFERENTIAL/PLATELET - Abnormal; Notable for the following components:      Result Value   WBC 12.1 (*)    RBC 2.99 (*)    Hemoglobin 8.7 (*)    HCT 27.2 (*)    RDW 18.2 (*)    Neutro Abs 10.1 (*)    All other components within normal limits  COMPREHENSIVE METABOLIC PANEL - Abnormal; Notable for the following components:   Sodium 134 (*)    Chloride 91 (*)    BUN 49 (*)    Creatinine, Ser 5.09 (*)    Calcium 7.8 (*)    Albumin 3.0 (*)    AST 463 (*)    ALT 229 (*)    GFR, Estimated 12 (*)    Anion gap 16 (*)    All other components within normal limits  BRAIN NATRIURETIC PEPTIDE - Abnormal; Notable for the following components:   B Natriuretic Peptide 1,212.6 (*)    All other components within normal limits  I-STAT CHEM 8, ED - Abnormal; Notable for the following components:   Sodium 132 (*)    Chloride 91 (*)    BUN 52 (*)    Creatinine, Ser 5.10 (*)    Calcium, Ion 0.85 (*)    Hemoglobin 9.9 (*)    HCT 29.0 (*)    All other components within normal limits  I-STAT VENOUS BLOOD GAS, ED - Abnormal; Notable for the following components:   pH, Ven 7.449 (*)  pO2, Ven 80.0 (*)    Bicarbonate 32.3 (*)    TCO2 34 (*)    Acid-Base Excess 7.0 (*)    Sodium 133 (*)    Calcium, Ion 0.86 (*)    HCT 29.0 (*)    Hemoglobin 9.9 (*)    All other components  within normal limits  TROPONIN I (HIGH SENSITIVITY) - Abnormal; Notable for the following components:   Troponin I (High Sensitivity) 707 (*)    All other components within normal limits  RESP PANEL BY RT-PCR (FLU A&B, COVID) ARPGX2  BLOOD GAS, VENOUS  TROPONIN I (HIGH SENSITIVITY)    EKG EKG Interpretation  Date/Time:  Friday May 31 2021 16:43:27 EST Ventricular Rate:  155 PR Interval:    QRS Duration: 94 QT Interval:  319 QTC Calculation: 513 R Axis:   155 Text Interpretation: Atrial fibrillation with rapid V-rate Probable right ventricular hypertrophy Since last tracing rate faster Confirmed by Wandra Arthurs [phone]) on 05/31/2021 5:05:29 PM  Radiology DG Chest Port 1 View  Result Date: 05/31/2021 CLINICAL DATA:  Shortness of breath EXAM: PORTABLE CHEST 1 VIEW COMPARISON:  01/05/2019 FINDINGS: Transverse diameter of heart is increased. Increased interstitial markings are seen in the right parahilar region and right lower lung fields. There is no focal consolidation. Lateral CP angles are not included in their entirety. There is no significant pleural effusion or pneumothorax. IMPRESSION: Cardiomegaly. Increased interstitial markings are seen in the right parahilar region and right lower lung fields. This may suggest asymmetric pulmonary edema or interstitial pneumonitis. Electronically Signed   By: Elmer Picker M.D.   On: 05/31/2021 17:09    Procedures Procedures   Medications Ordered in ED Medications  diltiazem (CARDIZEM) 125 mg in dextrose 5% 125 mL (1 mg/mL) infusion (10 mg/hr Intravenous Rate/Dose Change 05/31/21 1817)  magnesium sulfate IVPB 2 g 50 mL (0 g Intravenous Stopped 05/31/21 1803)  diltiazem (CARDIZEM) injection 10 mg (10 mg Intravenous Given 05/31/21 1714)  aspirin tablet 325 mg (325 mg Oral Given 05/31/21 1817)    ED Course  I have reviewed the triage vital signs and the nursing notes.  Pertinent labs & imaging results that were available during  my care of the patient were reviewed by me and considered in my medical decision making (see chart for details).    MDM Rules/Calculators/A&P                         This patient presents to the ED for concern of shortness of breath and rapid heart rate, this involves an extensive number of treatment options, and is a complaint that carries with it a high risk of complications and morbidity.  The differential diagnosis includes electrolyte abnormality, COPD exacerbation, COVID, rapid A. fib   Co morbidities that complicate the patient evaluation ESRD on dialysis, diabetes, hypertension   Additional history obtained: Additional history obtained from EMS External records from outside source obtained and reviewed including cardiology notes from [facility]   Lab Tests: I Ordered, and personally interpreted labs.  The pertinent results include: Elevated troponin of 700, normal potassium, VBG reassuring   Imaging Studies ordered: I ordered imaging studies including \\chest  x-ray I independently visualized and interpreted imaging which showed pulmonary edema I agree with the radiologist interpretation   Cardiac Monitoring: The patient was maintained on a cardiac monitor.  I personally viewed and interpreted the cardiac monitored which showed an underlying rhythm of: Rapid A. fib rate of 140s  Medicines ordered and prescription drug management: I ordered medication including magnesium for COPD exacerbation, patient received Cardizem bolus and drip for rapid A. fib Reevaluation of the patient after these medicines showed that the patient stayed the same I have reviewed the patients home medicines and have made adjustments as needed   Test Considered: CBC and CMP and VBG and BNP and troponin   Critical Interventions: Patient's troponin is elevated.  I called cardiology to see patient.  CRITICAL CARE Performed by: Wandra Arthurs   Total critical care time: 30 minutes  Critical care  time was exclusive of separately billable procedures and treating other patients.  Critical care was necessary to treat or prevent imminent or life-threatening deterioration.  Critical care was time spent personally by me on the following activities: development of treatment plan with patient and/or surrogate as well as nursing, discussions with consultants, evaluation of patient's response to treatment, examination of patient, obtaining history from patient or surrogate, ordering and performing treatments and interventions, ordering and review of laboratory studies, ordering and review of radiographic studies, pulse oximetry and re-evaluation of patient's condition.   Consultations Obtained: I requested consultation with cardiology,  and discussed lab and imaging findings as well as pertinent plan - they recommend: Continue Cardizem drip and they will see the patient.  I also discussed case with Dr. Osborne Casco from nephrology.  He states that he will see patient tomorrow and likely try and dialyze him first thing in the morning.  Recommend continue BiPAP and hospitalist admission   Problem List / ED Course: Rapid A. Fib, COPD, ESRD on dialysis   Reevaluation: After the interventions noted above, I reevaluated the patient and found that they have :improved    Dispostion: After consideration of the diagnostic results and the patients response to treatment, I feel that the patent would benefit from admission for step down .     Final Clinical Impression(s) / ED Diagnoses Final diagnoses:  None    Rx / DC Orders ED Discharge Orders     None        Drenda Freeze, MD 05/31/21 1830    Drenda Freeze, MD 05/31/21 [phone]

## 2021-05-31 NOTE — ED Triage Notes (Signed)
Pt from dialysis via [facility] EMS, "no fluid but removed fluid anyway," 3L removed during dialysis. 2L [place] at baseline, 3L in EMS extreme work of breathing, tripod belly breathing 30/min  92% on CPAP, expiratory wheezes 5 albuteral 0.5atrovent 125 solumedrol 20RAC  Afib 120-150's 160/96

## 2021-05-31 NOTE — ED Notes (Signed)
Admitting MD paged regarding patient's level of care

## 2021-06-01 ENCOUNTER — Observation Stay (HOSPITAL_COMMUNITY): Payer: Medicare Other

## 2021-06-01 ENCOUNTER — Encounter (HOSPITAL_COMMUNITY): Payer: Self-pay | Admitting: Internal Medicine

## 2021-06-01 DIAGNOSIS — E039 Hypothyroidism, unspecified: Secondary | ICD-10-CM | POA: Diagnosis present

## 2021-06-01 DIAGNOSIS — R778 Other specified abnormalities of plasma proteins: Secondary | ICD-10-CM | POA: Diagnosis present

## 2021-06-01 DIAGNOSIS — J441 Chronic obstructive pulmonary disease with (acute) exacerbation: Secondary | ICD-10-CM | POA: Diagnosis present

## 2021-06-01 DIAGNOSIS — X58XXXD Exposure to other specified factors, subsequent encounter: Secondary | ICD-10-CM | POA: Diagnosis present

## 2021-06-01 DIAGNOSIS — I083 Combined rheumatic disorders of mitral, aortic and tricuspid valves: Secondary | ICD-10-CM | POA: Diagnosis present

## 2021-06-01 DIAGNOSIS — A4101 Sepsis due to Methicillin susceptible Staphylococcus aureus: Secondary | ICD-10-CM | POA: Diagnosis present

## 2021-06-01 DIAGNOSIS — R7881 Bacteremia: Secondary | ICD-10-CM | POA: Diagnosis not present

## 2021-06-01 DIAGNOSIS — E785 Hyperlipidemia, unspecified: Secondary | ICD-10-CM | POA: Diagnosis not present

## 2021-06-01 DIAGNOSIS — R0609 Other forms of dyspnea: Secondary | ICD-10-CM | POA: Diagnosis not present

## 2021-06-01 DIAGNOSIS — R6521 Severe sepsis with septic shock: Secondary | ICD-10-CM | POA: Diagnosis not present

## 2021-06-01 DIAGNOSIS — E1122 Type 2 diabetes mellitus with diabetic chronic kidney disease: Secondary | ICD-10-CM | POA: Diagnosis present

## 2021-06-01 DIAGNOSIS — E440 Moderate protein-calorie malnutrition: Secondary | ICD-10-CM | POA: Diagnosis not present

## 2021-06-01 DIAGNOSIS — J1282 Pneumonia due to coronavirus disease 2019: Secondary | ICD-10-CM | POA: Diagnosis present

## 2021-06-01 DIAGNOSIS — U071 COVID-19: Secondary | ICD-10-CM | POA: Diagnosis present

## 2021-06-01 DIAGNOSIS — I4891 Unspecified atrial fibrillation: Secondary | ICD-10-CM | POA: Diagnosis not present

## 2021-06-01 DIAGNOSIS — N2581 Secondary hyperparathyroidism of renal origin: Secondary | ICD-10-CM | POA: Diagnosis present

## 2021-06-01 DIAGNOSIS — C7951 Secondary malignant neoplasm of bone: Secondary | ICD-10-CM | POA: Diagnosis present

## 2021-06-01 DIAGNOSIS — J9621 Acute and chronic respiratory failure with hypoxia: Secondary | ICD-10-CM

## 2021-06-01 DIAGNOSIS — I340 Nonrheumatic mitral (valve) insufficiency: Secondary | ICD-10-CM | POA: Diagnosis not present

## 2021-06-01 DIAGNOSIS — J9601 Acute respiratory failure with hypoxia: Secondary | ICD-10-CM | POA: Diagnosis not present

## 2021-06-01 DIAGNOSIS — I4819 Other persistent atrial fibrillation: Secondary | ICD-10-CM | POA: Diagnosis present

## 2021-06-01 DIAGNOSIS — N186 End stage renal disease: Secondary | ICD-10-CM

## 2021-06-01 DIAGNOSIS — L89152 Pressure ulcer of sacral region, stage 2: Secondary | ICD-10-CM | POA: Diagnosis not present

## 2021-06-01 DIAGNOSIS — E871 Hypo-osmolality and hyponatremia: Secondary | ICD-10-CM | POA: Diagnosis present

## 2021-06-01 DIAGNOSIS — I5033 Acute on chronic diastolic (congestive) heart failure: Secondary | ICD-10-CM | POA: Diagnosis present

## 2021-06-01 DIAGNOSIS — Z992 Dependence on renal dialysis: Secondary | ICD-10-CM | POA: Diagnosis not present

## 2021-06-01 DIAGNOSIS — G9341 Metabolic encephalopathy: Secondary | ICD-10-CM | POA: Diagnosis not present

## 2021-06-01 DIAGNOSIS — E669 Obesity, unspecified: Secondary | ICD-10-CM | POA: Diagnosis present

## 2021-06-01 DIAGNOSIS — J440 Chronic obstructive pulmonary disease with acute lower respiratory infection: Secondary | ICD-10-CM | POA: Diagnosis present

## 2021-06-01 DIAGNOSIS — D631 Anemia in chronic kidney disease: Secondary | ICD-10-CM | POA: Diagnosis present

## 2021-06-01 DIAGNOSIS — I5023 Acute on chronic systolic (congestive) heart failure: Secondary | ICD-10-CM | POA: Diagnosis not present

## 2021-06-01 DIAGNOSIS — I7121 Aneurysm of the ascending aorta, without rupture: Secondary | ICD-10-CM | POA: Diagnosis present

## 2021-06-01 DIAGNOSIS — I132 Hypertensive heart and chronic kidney disease with heart failure and with stage 5 chronic kidney disease, or end stage renal disease: Secondary | ICD-10-CM | POA: Diagnosis present

## 2021-06-01 LAB — COMPREHENSIVE METABOLIC PANEL
ALT: 245 U/L — ABNORMAL HIGH (ref 0–44)
AST: 468 U/L — ABNORMAL HIGH (ref 15–41)
Albumin: 2.7 g/dL — ABNORMAL LOW (ref 3.5–5.0)
Alkaline Phosphatase: 87 U/L (ref 38–126)
Anion gap: 18 — ABNORMAL HIGH (ref 5–15)
BUN: 60 mg/dL — ABNORMAL HIGH (ref 8–23)
CO2: 23 mmol/L (ref 22–32)
Calcium: 7.7 mg/dL — ABNORMAL LOW (ref 8.9–10.3)
Chloride: 90 mmol/L — ABNORMAL LOW (ref 98–111)
Creatinine, Ser: 6.01 mg/dL — ABNORMAL HIGH (ref 0.61–1.24)
GFR, Estimated: 10 mL/min — ABNORMAL LOW (ref >60)
Glucose, Bld: 211 mg/dL — ABNORMAL HIGH (ref 70–99)
Potassium: 4.5 mmol/L (ref 3.5–5.1)
Sodium: 131 mmol/L — ABNORMAL LOW (ref 135–145)
Total Bilirubin: 0.7 mg/dL (ref 0.3–1.2)
Total Protein: 6.8 g/dL (ref 6.5–8.1)

## 2021-06-01 LAB — GLUCOSE, CAPILLARY
Glucose-Capillary: 132 mg/dL — ABNORMAL HIGH (ref 70–99)
Glucose-Capillary: 133 mg/dL — ABNORMAL HIGH (ref 70–99)
Glucose-Capillary: 133 mg/dL — ABNORMAL HIGH (ref 70–99)
Glucose-Capillary: 146 mg/dL — ABNORMAL HIGH (ref 70–99)
Glucose-Capillary: 146 mg/dL — ABNORMAL HIGH (ref 70–99)
Glucose-Capillary: 205 mg/dL — ABNORMAL HIGH (ref 70–99)
Glucose-Capillary: 222 mg/dL — ABNORMAL HIGH (ref 70–99)

## 2021-06-01 LAB — CBC
HCT: 22.9 % — ABNORMAL LOW (ref 39.0–52.0)
Hemoglobin: 7.2 g/dL — ABNORMAL LOW (ref 13.0–17.0)
MCH: 29 pg (ref 26.0–34.0)
MCHC: 31.4 g/dL (ref 30.0–36.0)
MCV: 92.3 fL (ref 80.0–100.0)
Platelets: 217 10*3/uL (ref 150–400)
RBC: 2.48 MIL/uL — ABNORMAL LOW (ref 4.22–5.81)
RDW: 18.1 % — ABNORMAL HIGH (ref 11.5–15.5)
WBC: 6.2 10*3/uL (ref 4.0–10.5)
nRBC: 0 % (ref 0.0–0.2)

## 2021-06-01 LAB — D-DIMER, QUANTITATIVE: D-Dimer, Quant: 1.89 ug/mL-FEU — ABNORMAL HIGH (ref 0.00–0.50)

## 2021-06-01 LAB — HEMOGLOBIN A1C
Hgb A1c MFr Bld: 6.1 % — ABNORMAL HIGH (ref 4.8–5.6)
Mean Plasma Glucose: 128.37 mg/dL

## 2021-06-01 LAB — C-REACTIVE PROTEIN: CRP: 13 mg/dL — ABNORMAL HIGH (ref <1.0)

## 2021-06-01 LAB — ECHOCARDIOGRAM COMPLETE
Height: 72 in
MV M vel: 5.04 m/s
MV Peak grad: 101.6 mmHg
P 1/2 time: 537 msec
Radius: 1.1 cm
S' Lateral: 4.7 cm
Weight: 4211.67 oz

## 2021-06-01 LAB — PHOSPHORUS: Phosphorus: 8.5 mg/dL — ABNORMAL HIGH (ref 2.5–4.6)

## 2021-06-01 LAB — CBG MONITORING, ED: Glucose-Capillary: 188 mg/dL — ABNORMAL HIGH (ref 70–99)

## 2021-06-01 LAB — MAGNESIUM: Magnesium: 2.4 mg/dL (ref 1.7–2.4)

## 2021-06-01 LAB — TYPE AND SCREEN
ABO/RH(D): A POS
Antibody Screen: NEGATIVE

## 2021-06-01 LAB — MRSA NEXT GEN BY PCR, NASAL: MRSA by PCR Next Gen: NOT DETECTED

## 2021-06-01 LAB — FERRITIN: Ferritin: 5302 ng/mL — ABNORMAL HIGH (ref 24–336)

## 2021-06-01 MED ORDER — AMIODARONE HCL 200 MG PO TABS
200.0000 mg | ORAL_TABLET | Freq: Two times a day (BID) | ORAL | Status: DC
  Administered 2021-06-01 – 2021-06-04 (×5): 200 mg via ORAL
  Filled 2021-06-01 (×6): qty 1

## 2021-06-01 MED ORDER — AMIODARONE HCL 200 MG PO TABS: 200.0000 mg | ORAL_TABLET | Freq: Every day | ORAL | Status: DC

## 2021-06-01 MED ORDER — AMIODARONE HCL 200 MG PO TABS: 200.0000 mg | ORAL_TABLET | Freq: Two times a day (BID) | ORAL | Status: DC

## 2021-06-01 MED ORDER — INSULIN GLARGINE (1 UNIT DIAL) 300 UNIT/ML ~~LOC~~ SOPN: 10.0000 [IU] | PEN_INJECTOR | Freq: Every day | SUBCUTANEOUS | Status: DC

## 2021-06-01 MED ORDER — CHLORHEXIDINE GLUCONATE 0.12 % MT SOLN
15.0000 mL | Freq: Two times a day (BID) | OROMUCOSAL | Status: DC
  Administered 2021-06-01 – 2021-06-06 (×12): 15 mL via OROMUCOSAL
  Filled 2021-06-01 (×12): qty 15

## 2021-06-01 MED ORDER — INSULIN GLARGINE-YFGN 100 UNIT/ML ~~LOC~~ SOLN
10.0000 [IU] | Freq: Every day | SUBCUTANEOUS | Status: DC
  Administered 2021-06-02 (×2): 10 [IU] via SUBCUTANEOUS
  Filled 2021-06-01 (×4): qty 0.1

## 2021-06-01 MED ORDER — CHLORHEXIDINE GLUCONATE CLOTH 2 % EX PADS
6.0000 | MEDICATED_PAD | Freq: Every day | CUTANEOUS | Status: DC
  Administered 2021-06-01 – 2021-06-09 (×6): 6 via TOPICAL

## 2021-06-01 MED ORDER — ORAL CARE MOUTH RINSE
15.0000 mL | Freq: Two times a day (BID) | OROMUCOSAL | Status: DC
  Administered 2021-06-02 – 2021-06-06 (×8): 15 mL via OROMUCOSAL

## 2021-06-01 NOTE — Consult Note (Signed)
ESRD Consult Note  Requesting provider: Eulogio Bear Service requesting consult: Hospitalist Reason for consult: ESRD, provision of dialysis Indication for acute dialysis?: End Stage Renal Disease  Outpatient dialysis unit: [facility] dialysis schedule: MWF Outpt prescription: F180, BFR 450, 4hrs, 2 K, 2 Ca, EDW 123kg  Assessment/Recommendations: Richard Hall is a/an 65 y.o. male with a past medical history notable for ESRD on HD admitted with acute hypoxic respiratory failure secondary to COVID-pneumonia.   # ESRD: Missed 2 dialysis session on 12/26 and 12/28 but received full session on 12/30. Left below his EDW at 121. I think most of his hypoxia was related to covid and not excess volume. Some possible pulmonary edema from rapid heart rates and with rate control respiratory status has improved. No plans for dialysis today but may consider tomorrow vs Monday based on his progress # Volume/ hypertension: EDW 123kg. But left below EDW at 121 on 12/30. Will likely challenge EDW here. BP acceptable # Anemia of Chronic Kidney Disease: Hemoglobin 7.2 Ferritin 5300 so cant use iron. Receives mircera 263mcg q2 weeks. Will plan to dose aranesp with next treatment. Transfuse PRN.  # Secondary Hyperparathyroidism/Hyperphosphatemia: Phos 8.5. Calcium corrects near normal when accounting for albumin. Auryxia 3 tablets with meals # Vascular access: Left AVF with no issues #Acute hypoxic respiratory failure secondary to COVID-pneumonia: Oxygen status has improved today.  Treatment per primary team #Atrial Fibrillation w/ RVR: Rate control improved.  Management per primary team  # Additional recommendations: - Dose all meds for creatinine clearance < 10 ml/min  - Unless absolutely necessary, no MRIs with gadolinium.  - Implement save arm precautions.  Prefer needle sticks in the dorsum of the hands or wrists.  No blood pressure measurements in arm. - If blood transfusion is requested during  hemodialysis sessions, please alert [place] prior to the session.   Recommendations were discussed with the primary team.   History of Present Illness: Richard Hall is a/an 65 y.o. male with a past medical history of ESRD who presents with acute hypoxic respiratory failure.  The patient states that he is overall felt fine until the last couple days.  He skipped dialysis on 12/26 and 12/28.  He did not have a specific reason for not going to dialysis.  He went to dialysis yesterday and felt like he had a lot of volume on but was breathing okay.  Near the end of his dialysis session he experienced acute onset of shortness of breath.  EMS was called and he was brought to the emergency department for further evaluation.  He did develop tachycardia with atrial fibrillation with RVR.  The patient denies any preceding respiratory symptoms but the patient's son did tell staff that he had experienced some cough, congestion, shortness of breath.  Denies any fevers.  In the emergency department he was found to be in A. fib with RVR.  He also was tachypneic.  He was placed on BiPAP and saturations improved.  Labs were overall reassuring except for elevation of AST and ALT.  Also with significant anemia which is a chronic issue.  Respiratory panel was positive for COVID.  Chest x-ray did show some signs of pulmonary inflammation versus pulmonary edema.  Since admission the patient has received medications to help with rate control.  His respiratory status is improved.   Medications:  Current Facility-Administered Medications  Medication Dose Route Frequency Provider Last Rate Last Admin   acetaminophen (TYLENOL) tablet 650 mg  650 mg Oral Q6H PRN Trilby Drummer,  Candace Gallus, MD       Or   acetaminophen (TYLENOL) suppository 650 mg  650 mg Rectal Q6H PRN Marcelyn Bruins, MD       amiodarone (NEXTERONE PREMIX) 360-4.14 MG/200ML-% (1.8 mg/mL) IV infusion  30 mg/hr Intravenous Continuous Marcelyn Bruins, MD 16.67  mL/hr at 06/01/21 0924 30 mg/hr at 06/01/21 0924   apixaban (ELIQUIS) tablet 2.5 mg  2.5 mg Oral BID Marcelyn Bruins, MD   2.5 mg at 06/01/21 4098   chlorhexidine (PERIDEX) 0.12 % solution 15 mL  15 mL Mouth Rinse BID Marcelyn Bruins, MD   15 mL at 06/01/21 1191   Chlorhexidine Gluconate Cloth 2 % PADS 6 each  6 each Topical Q0600 Marcelyn Bruins, MD   6 each at 06/01/21 0134   dexamethasone (DECADRON) injection 6 mg  6 mg Intravenous Q24H Marcelyn Bruins, MD   6 mg at 05/31/21 2334   furosemide (LASIX) injection 80 mg  80 mg Intravenous BID Marcelyn Bruins, MD   80 mg at 06/01/21 0900   insulin aspart (novoLOG) injection 0-20 Units  0-20 Units Subcutaneous Q4H Marcelyn Bruins, MD   3 Units at 06/01/21 0800   insulin glargine-yfgn (SEMGLEE) injection 10 Units  10 Units Subcutaneous QHS Vann, Jessica U, DO       levothyroxine (SYNTHROID) tablet 100 mcg  100 mcg Oral QAC breakfast Marcelyn Bruins, MD   100 mcg at 06/01/21 0800   linaclotide (LINZESS) capsule 72 mcg  72 mcg Oral QAC breakfast Marcelyn Bruins, MD       MEDLINE mouth rinse  15 mL Mouth Rinse q12n4p Marcelyn Bruins, MD       metoprolol tartrate (LOPRESSOR) tablet 25 mg  25 mg Oral BID Marcelyn Bruins, MD   25 mg at 06/01/21 4782   polyethylene glycol (MIRALAX / GLYCOLAX) packet 17 g  17 g Oral Daily PRN Marcelyn Bruins, MD       remdesivir 100 mg in sodium chloride 0.9 % 100 mL IVPB  100 mg Intravenous Daily Marcelyn Bruins, MD 200 mL/hr at 06/01/21 0929 100 mg at 06/01/21 0929   sertraline (ZOLOFT) tablet 100 mg  100 mg Oral Daily Marcelyn Bruins, MD   100 mg at 06/01/21 9562   sodium chloride flush (NS) 0.9 % injection 3 mL  3 mL Intravenous Q12H Marcelyn Bruins, MD   3 mL at 06/01/21 1308   tamsulosin (FLOMAX) capsule 0.4 mg  0.4 mg Oral Daily Marcelyn Bruins, MD   0.4 mg at 06/01/21 6578     ALLERGIES Gabapentin  MEDICAL HISTORY Past Medical History:  Diagnosis Date    A-V fistula (HCC)    left arm   Anemia    Arthritis    CHF (congestive heart failure) ([place])    CVA (cerebral vascular accident) ([place]) 2010   Diabetes mellitus with end-stage renal disease ([place])    Dyalisis pt.   Diabetic neuropathy (HCC)    Bilateral feet   Dyslipidemia    Dysrhythmia    A-fib   Essential hypertension    Hepatitis    childhood   History of blood transfusion    Hypothyroidism (acquired)    OSA on CPAP    Prostate cancer [facility])    Uses wheelchair      SOCIAL HISTORY Social History   Socioeconomic History   Marital status: Single    Spouse name: Not on file   Number of  children: Not on file   Years of education: Not on file   Highest education level: Not on file  Occupational History   Occupation: retired  Tobacco Use   Smoking status: Former    Types: Cigarettes    Quit date: 1997    Years since quitting: 26.0   Smokeless tobacco: Never  Vaping Use   Vaping Use: Never used  Substance and Sexual Activity   Alcohol use: Never   Drug use: Never   Sexual activity: Not on file  Other Topics Concern   Not on file  Social History Narrative   Not on file   Social Determinants of Health   Financial Resource Strain: Not on file  Food Insecurity: Not on file  Transportation Needs: Not on file  Physical Activity: Not on file  Stress: Not on file  Social Connections: Not on file  Intimate Partner Violence: Not on file     FAMILY HISTORY Family History  Problem Relation Age of Onset   Cancer Mother      Review of Systems: 12 systems were reviewed and negative except per HPI  Physical Exam: Vitals:   06/01/21 0700 06/01/21 0808  BP: 129/84   Pulse: 90   Resp: 15   Temp:  98.7 F (37.1 C)  SpO2: 100%    No intake/output data recorded.  Intake/Output Summary (Last 24 hours) at 06/01/2021 0931 Last data filed at 06/01/2021 0700 Gross per 24 hour  Intake 354.94 ml  Output 150 ml  Net 204.94 ml   General: Chronically ill-appearing,  lying in bed, no distress HEENT: anicteric sclera, MMM CV: normal rate, no lower extremity edema Lungs: bilateral chest rise, mildly increased work of breathing Abd: soft, non-tender, non-distended Skin: no visible lesions or rashes Psych: alert, engaged, appropriate mood and affect Neuro: normal speech, no gross focal deficits   Test Results Reviewed Lab Results  Component Value Date   NA 131 (L) 06/01/2021   K 4.5 06/01/2021   CL 90 (L) 06/01/2021   CO2 23 06/01/2021   BUN 60 (H) 06/01/2021   CREATININE 6.01 (H) 06/01/2021   CALCIUM 7.7 (L) 06/01/2021   ALBUMIN 2.7 (L) 06/01/2021   PHOS 8.5 (H) 06/01/2021    I have reviewed relevant outside healthcare records

## 2021-06-01 NOTE — Progress Notes (Signed)
Progress Note  Patient Name: Richard Hall Date of Encounter: 06/01/2021  [facility] Cardiologist: None   Subjective   Feeling much better.  No shortness of breath.   Inpatient Medications    Scheduled Meds:  apixaban  2.5 mg Oral BID   chlorhexidine  15 mL Mouth Rinse BID   Chlorhexidine Gluconate Cloth  6 each Topical Q0600   dexamethasone (DECADRON) injection  6 mg Intravenous Q24H   furosemide  80 mg Intravenous BID   insulin aspart  0-20 Units Subcutaneous Q4H   insulin glargine-yfgn  10 Units Subcutaneous QHS   levothyroxine  100 mcg Oral QAC breakfast   linaclotide  72 mcg Oral QAC breakfast   mouth rinse  15 mL Mouth Rinse q12n4p   metoprolol tartrate  25 mg Oral BID   sertraline  100 mg Oral Daily   sodium chloride flush  3 mL Intravenous Q12H   tamsulosin  0.4 mg Oral Daily   Continuous Infusions:  amiodarone 30 mg/hr (06/01/21 0924)   remdesivir 100 mg in NS 100 mL 100 mg (06/01/21 0929)   PRN Meds: acetaminophen **OR** acetaminophen, polyethylene glycol   Vital Signs    Vitals:   06/01/21 0800 06/01/21 0808 06/01/21 0900 06/01/21 1000  BP: 126/80  (!) 122/93   Pulse: 85  89 83  Resp: 13  12 (!) 24  Temp:  98.7 F (37.1 C)    TempSrc:  Oral    SpO2: 99%  99% 94%  Weight:      Height:        Intake/Output Summary (Last 24 hours) at 06/01/2021 1103 Last data filed at 06/01/2021 0700 Gross per 24 hour  Intake 354.94 ml  Output 150 ml  Net 204.94 ml   Last 3 Weights 06/01/2021 07/26/2020 07/26/2020  Weight (lbs) 263 lb 3.7 oz 276 lb 3.2 oz 268 lb  Weight (kg) 119.4 kg 125.283 kg 121.564 kg      Telemetry    Atrial fibrillation.  Rate <100 bpm.  - Personally Reviewed  ECG     Atrial fibrillation.  Rate 155 bpm.  - Personally Reviewed  Physical Exam   GEN: Chronically ill-appearing.  No acute distress.   Neck: No JVD Cardiac: Irregularly irregular, no murmurs, rubs, or gallops.  Respiratory: Clear to auscultation  bilaterally. GI: Soft, nontender, non-distended  MS: No edema; No deformity. Neuro:  Nonfocal  Psych: Normal affect   Labs    High Sensitivity Troponin:   Recent Labs  Lab 05/31/21 1700 05/31/21 1851  TROPONINIHS 707* 660*     Chemistry Recent Labs  Lab 05/31/21 1700 05/31/21 1717 06/01/21 0232  NA 134* 132*   133* 131*  K 3.9 3.8   3.8 4.5  CL 91* 91* 90*  CO2 27  --  23  GLUCOSE 93 88 211*  BUN 49* 52* 60*  CREATININE 5.09* 5.10* 6.01*  CALCIUM 7.8*  --  7.7*  MG  --   --  2.4  PROT 7.7  --  6.8  ALBUMIN 3.0*  --  2.7*  AST 463*  --  468*  ALT 229*  --  245*  ALKPHOS 112  --  87  BILITOT 0.4  --  0.7  GFRNONAA 12*  --  10*  ANIONGAP 16*  --  18*    Lipids No results for input(s): CHOL, TRIG, HDL, LABVLDL, LDLCALC, CHOLHDL in the last 168 hours.  Hematology Recent Labs  Lab 05/31/21 1700 05/31/21 1717 06/01/21 0932  WBC 12.1*  --  6.2  RBC 2.99*  --  2.48*  HGB 8.7* 9.9*   9.9* 7.2*  HCT 27.2* 29.0*   29.0* 22.9*  MCV 91.0  --  92.3  MCH 29.1  --  29.0  MCHC 32.0  --  31.4  RDW 18.2*  --  18.1*  PLT 269  --  217   Thyroid No results for input(s): TSH, FREET4 in the last 168 hours.  BNP Recent Labs  Lab 05/31/21 1701  BNP 1,212.6*    DDimer  Recent Labs  Lab 06/01/21 0232  DDIMER 1.89*     Radiology    DG Chest Port 1 View  Result Date: 05/31/2021 CLINICAL DATA:  Shortness of breath EXAM: PORTABLE CHEST 1 VIEW COMPARISON:  01/05/2019 FINDINGS: Transverse diameter of heart is increased. Increased interstitial markings are seen in the right parahilar region and right lower lung fields. There is no focal consolidation. Lateral CP angles are not included in their entirety. There is no significant pleural effusion or pneumothorax. IMPRESSION: Cardiomegaly. Increased interstitial markings are seen in the right parahilar region and right lower lung fields. This may suggest asymmetric pulmonary edema or interstitial pneumonitis. Electronically Signed    By: Elmer Picker M.D.   On: 05/31/2021 17:09    Cardiac Studies   Echo pending  Patient Profile     65 y.o. male with paroxysmal atrial fibrillation, chronic diastolic heart failure, prior stroke, recurrent GI bleed, status post Watchman device 04/18/2021, prostate cancer, and ESRD on HD admitted with hypoxic respiratory failure, COVID-19 pneumonia, acute on chronic diastolic heart failure, and atrial fibrillation with RVR.  Assessment & Plan    #Paroxysmal atrial fibrillation: Patient was admitted with A. fib with RVR.  He previously underwent a watchman device implantation due to recurrent GI bleeding and anemia requiring iron infusion.  Currently he remains in rate controlled atrial fibrillation.  He was started on amiodarone with improvement in his rate control.  Continue amiodarone, metoprolol, and Eliquis.  We will transition amiodarone to oral this evening.  Plan to treat for 30 days.   #Hypoxic respiratory failure: #Acute on chronic diastolic heart failure: #Hypertension: #COVID-19 pneumonia: Patient developed acute onset of shortness of breath during dialysis.  He was in A. fib with RVR.  Pulmonary infiltrates on admission.  He was initially required BiPAP on admission.  He does still make some urine and was given a dose of IV Lasix.  He is receiving IV steroids and IV remdesivir for COVID-19.  #ESRD on HD: Per nephrology. Missed 2 HD sessions prior to 12/30.  # CVA:  # hyperlipidemia:  Home statin on hold for now in setting of COVID-19 treatment.  Resume after.  Total critical care time: 35 minutes. Critical care time was exclusive of separately billable procedures and treating other patients. Critical care was necessary to treat or prevent imminent or life-threatening deterioration. Critical care was time spent personally by me on the following activities: development of treatment plan with patient and/or surrogate as well as nursing, discussions with consultants,  evaluation of patient's response to treatment, examination of patient, obtaining history from patient or surrogate, ordering and performing treatments and interventions, ordering and review of laboratory studies, ordering and review of radiographic studies, pulse oximetry and re-evaluation of patient's condition.       For questions or updates, please contact [place] Please consult [email or web address] for contact info under        Signed, Skeet Latch, MD  06/01/2021, 11:03 AM

## 2021-06-01 NOTE — ED Notes (Signed)
Pt on 4L [place], tolerating well. O2 sats remain at or above 96%. Admitting MD aware

## 2021-06-01 NOTE — Progress Notes (Signed)
°  Echocardiogram 2D Echocardiogram has been performed.  Richard Hall F 06/01/2021, 8:54 AM

## 2021-06-01 NOTE — Progress Notes (Signed)
Pt refusing BIPAP for the night.,will monoter

## 2021-06-01 NOTE — Progress Notes (Signed)
Patients belongings: medical bracelet and keys placed in specimen cup with patient label inside a patient belongings bag at bedside along with patients shirt, shorts, and hat.

## 2021-06-01 NOTE — Progress Notes (Signed)
eLink Physician-Brief Progress Note Patient Name: Richard Hall DOB: 29-May-1956 MRN: [id number]   Date of Service  06/01/2021  HPI/Events of Note  Patient transported from dialysis to ED secondary to acute shortness of breath, in the ED he was found to be in atrial fibrillation with RVR, also has bilateral pulmonary parenchymal  infiltrates r/o asymmetric pulmonary edema vs pneumonitis, patient has been admitted for further work up and Rx.  eICU Interventions  New Patient Evaluation.        [name] [name] 06/01/2021, 2:25 AM

## 2021-06-01 NOTE — Progress Notes (Signed)
Progress Note    Richard Hall  HEN:[id number] DOB: 04-Oct-1955  DOA: 05/31/2021 PCP: [name], MD    Brief Narrative:     Medical records reviewed and are as summarized below:  Richard Hall is an 65 y.o. male with medical history significant of anemia, atrial fibrillation, CHF, ESRD on HD, chronic respiratory failure, diabetes, neuropathy, hyperlipidemia, hypertension, hypothyroidism, OSA, history of CVA, prostate cancer who presents from dialysis with shortness of breath.  Found to be COVID + also in a fib with RVR.  Initially required Bipap and was placed in ICU under [place].   Assessment/Plan:     Acute on chronic respiratory failure with hypoxia -off bipap -wears 2L PRN at home -currently on 4L -wean to 2 as able and off if able -CPAP QHS and while napping  COVID-19 infection -daily labs -IV Steroids -IV remdesivir started  CHF exacerbation/Atrial fibrillation with RVR/Volume overload > Patient presenting with acute respiratory failure/shortness of breath from dialysis.  > Outside records show recent echo status post watchman procedure for A. fib showed EF 50-55% -cardiology consulted:  - Transition to amiodarone bolus and gtt from diltiazem given CHF and not currently responding to diltiazem drip.  - Continue with Lasix twice daily until able to get dialysis. - Strict I's and O's/daily weights -- Continue home Eliquis, metoprolol   Diabetes - SSI q 4 for now-- change to with meals once I know he is eating constantly -d/c glipizide  -add low dose semglee tonight as he is on steroids and will be eating-- adjust as needed   ESRD > On dialysis Monday Wednesday Friday.  Completed 4 hours of dialysis today.  Electrolytes are stable but is volume overloaded. > Nephrology consulted by myself - Treating with Lasix as above for now - Dialysis needed   Transaminitis > Noted to have new AST and ALT elevation to 463 and 229 respectively.  Other LFTs are  normal.  Possibility of shock liver in the setting of volume overload A. fib and CHF exacerbation as above.   -hepatitis panel negative -Patient  COVID-positive so likely etiology for transaminitis.     Anemia: anemia of chronic kidney disease > Hgb trending down -type and screen -? Volume dilution -no reported bleeding  Hypothyroidism - Continue home Synthroid  Prostate cancer - Working to verify his p.o. medications for this, will wait on pharmacy verification  - On Xtandi versus bicalutamide, also history of leuprolide injections - Continue home tamsulosin   Hypertension - Continue home metoprolol -resume HD   Hyponatremia -suspect due to volume overload -daily labs  Hyperlipidemia History of CVA - hold statin as liver enzymes elevated   OSA - On BiPAP/CPAP QHS now   obesity Estimated body mass index is 35.7 kg/m as calculated from the following:   Height as of this encounter: 6' (1.829 m).   Weight as of this encounter: 119.4 kg.    Family Communication/Anticipated D/C date and plan/Code Status   DVT prophylaxis: eliquis Code Status: Full Code.  Disposition Plan: Status is: Observation  The patient will require care spanning > 2 midnights and should be moved to inpatient because: clearly this patient in the ICU who required Bipap, HD, treatment for COVID will be here > 2 midnights        Medical Consultants:   Cards renal  Subjective:   Able to come off the bipap last PM Hungry this AM  Objective:    Vitals:   06/01/21 0600 06/01/21  0630 06/01/21 0700 06/01/21 0808  BP: 124/71 128/82 129/84   Pulse: 92 86 90   Resp: 17 14 15    Temp:    98.7 F (37.1 C)  TempSrc:    Oral  SpO2: 100% 98% 100%   Weight:      Height:        Intake/Output Summary (Last 24 hours) at 06/01/2021 0844 Last data filed at 06/01/2021 0700 Gross per 24 hour  Intake 354.94 ml  Output 150 ml  Net 204.94 ml   Filed Weights   06/01/21 0130  Weight: 119.4 kg     Exam:  General: Appearance:    Obese male in no acute distress     Lungs:     respirations unlabored on 4L [place], diminished with wheezing  Heart:    Normal heart rate.  irr  MS:   All extremities are intact. Multiple areas of bruising   Neurologic:   Awake, alert, will fall asleep but easily awakened     Data Reviewed:   I have personally reviewed following labs and imaging studies:  Labs: Labs show the following:   Basic Metabolic Panel: Recent Labs  Lab 05/31/21 1700 05/31/21 1717 06/01/21 0232  NA 134* 132*   133* 131*  K 3.9 3.8   3.8 4.5  CL 91* 91* 90*  CO2 27  --  23  GLUCOSE 93 88 211*  BUN 49* 52* 60*  CREATININE 5.09* 5.10* 6.01*  CALCIUM 7.8*  --  7.7*  MG  --   --  2.4  PHOS  --   --  8.5*   GFR Estimated Creatinine Clearance: 16.1 mL/min (A) (by C-G formula based on SCr of 6.01 mg/dL (H)). Liver Function Tests: Recent Labs  Lab 05/31/21 1700 06/01/21 0232  AST 463* 468*  ALT 229* 245*  ALKPHOS 112 87  BILITOT 0.4 0.7  PROT 7.7 6.8  ALBUMIN 3.0* 2.7*   No results for input(s): LIPASE, AMYLASE in the last 168 hours. No results for input(s): AMMONIA in the last 168 hours. Coagulation profile No results for input(s): INR, PROTIME in the last 168 hours.  CBC: Recent Labs  Lab 05/31/21 1700 05/31/21 1717 06/01/21 0232  WBC 12.1*  --  6.2  NEUTROABS 10.1*  --   --   HGB 8.7* 9.9*   9.9* 7.2*  HCT 27.2* 29.0*   29.0* 22.9*  MCV 91.0  --  92.3  PLT 269  --  217   Cardiac Enzymes: No results for input(s): CKTOTAL, CKMB, CKMBINDEX, TROPONINI in the last 168 hours. BNP (last 3 results) No results for input(s): PROBNP in the last 8760 hours. CBG: Recent Labs  Lab 05/31/21 2105 06/01/21 0048 06/01/21 0144 06/01/21 0341 06/01/21 0806  GLUCAP 159* 188* 205* 222* 146*   D-Dimer: Recent Labs    06/01/21 0232  DDIMER 1.89*   Hgb A1c: Recent Labs    06/01/21 0232  HGBA1C 6.1*   Lipid Profile: No results for input(s): CHOL,  HDL, LDLCALC, TRIG, CHOLHDL, LDLDIRECT in the last 72 hours. Thyroid function studies: No results for input(s): TSH, T4TOTAL, T3FREE, THYROIDAB in the last 72 hours.  Invalid input(s): FREET3 Anemia work up: Recent Labs    06/01/21 0232  FERRITIN 5,302*   Sepsis Labs: Recent Labs  Lab 05/31/21 1700 06/01/21 0232  WBC 12.1* 6.2    Microbiology Recent Results (from the past 240 hour(s))  Resp Panel by RT-PCR (Flu A&B, Covid) Nasopharyngeal Swab     Status: Abnormal  Collection Time: 05/31/21  4:48 PM   Specimen: Nasopharyngeal Swab; Nasopharyngeal(NP) swabs in vial transport medium  Result Value Ref Range Status   SARS Coronavirus 2 by RT PCR POSITIVE (A) NEGATIVE Final    Comment: (NOTE) SARS-CoV-2 target nucleic acids are DETECTED.  The SARS-CoV-2 RNA is generally detectable in upper respiratory specimens during the acute phase of infection. Positive results are indicative of the presence of the identified virus, but do not rule out bacterial infection or co-infection with other pathogens not detected by the test. Clinical correlation with patient history and other diagnostic information is necessary to determine patient infection status. The expected result is Negative.  Fact Sheet for Patients: EntrepreneurPulse.com.au  Fact Sheet for Healthcare Providers: IncredibleEmployment.be  This test is not yet approved or cleared by the [place] FDA and  has been authorized for detection and/or diagnosis of SARS-CoV-2 by FDA under an Emergency Use Authorization (EUA).  This EUA will remain in effect (meaning this test can be used) for the duration of  the COVID-19 declaration under Section 564(b)(1) of the A ct, 21 U.S.C. section 360bbb-3(b)(1), unless the authorization is terminated or revoked sooner.     Influenza A by PCR NEGATIVE NEGATIVE Final   Influenza B by PCR NEGATIVE NEGATIVE Final    Comment: (NOTE) The Xpert Xpress  SARS-CoV-2/FLU/RSV plus assay is intended as an aid in the diagnosis of influenza from Nasopharyngeal swab specimens and should not be used as a sole basis for treatment. Nasal washings and aspirates are unacceptable for Xpert Xpress SARS-CoV-2/FLU/RSV testing.  Fact Sheet for Patients: EntrepreneurPulse.com.au  Fact Sheet for Healthcare Providers: IncredibleEmployment.be  This test is not yet approved or cleared by the [place] FDA and has been authorized for detection and/or diagnosis of SARS-CoV-2 by FDA under an Emergency Use Authorization (EUA). This EUA will remain in effect (meaning this test can be used) for the duration of the COVID-19 declaration under Section 564(b)(1) of the Act, 21 U.S.C. section 360bbb-3(b)(1), unless the authorization is terminated or revoked.  Performed at [facility], [place] [address] [place], [place] [address]   MRSA Next Gen by PCR, Nasal     Status: None   Collection Time: 06/01/21  1:23 AM   Specimen: Nasal Mucosa; Nasal Swab  Result Value Ref Range Status   MRSA by PCR Next Gen NOT DETECTED NOT DETECTED Final    Comment: (NOTE) The GeneXpert MRSA Assay (FDA approved for NASAL specimens only), is one component of a comprehensive MRSA colonization surveillance program. It is not intended to diagnose MRSA infection nor to guide or monitor treatment for MRSA infections. Test performance is not FDA approved in patients less than 51 years old. Performed at [facility], [place] [address] [place], [place] [address]     Procedures and diagnostic studies:  DG Chest Port 1 View  Result Date: 05/31/2021 CLINICAL DATA:  Shortness of breath EXAM: PORTABLE CHEST 1 VIEW COMPARISON:  01/05/2019 FINDINGS: Transverse diameter of heart is increased. Increased interstitial markings are seen in the right parahilar region and right lower lung fields. There is no focal consolidation. Lateral CP angles are  not included in their entirety. There is no significant pleural effusion or pneumothorax. IMPRESSION: Cardiomegaly. Increased interstitial markings are seen in the right parahilar region and right lower lung fields. This may suggest asymmetric pulmonary edema or interstitial pneumonitis. Electronically Signed   By: Elmer Picker M.D.   On: 05/31/2021 17:09    Medications:  apixaban  2.5 mg Oral BID   atorvastatin  80 mg Oral Daily   chlorhexidine  15 mL Mouth Rinse BID   Chlorhexidine Gluconate Cloth  6 each Topical Q0600   dexamethasone (DECADRON) injection  6 mg Intravenous Q24H   furosemide  80 mg Intravenous BID   insulin aspart  0-20 Units Subcutaneous Q4H   levothyroxine  100 mcg Oral QAC breakfast   linaclotide  72 mcg Oral QAC breakfast   mouth rinse  15 mL Mouth Rinse q12n4p   metoprolol tartrate  25 mg Oral BID   sertraline  100 mg Oral Daily   sodium chloride flush  3 mL Intravenous Q12H   tamsulosin  0.4 mg Oral Daily   Continuous Infusions:  amiodarone 30 mg/hr (06/01/21 0700)   remdesivir 100 mg in NS 100 mL       LOS: 0 days   Geradine Girt  [facility]   How to contact the [facility] Attending or Consulting provider [place] or covering provider during after hours [place], for this patient?  Check the care team in [facility] and look for a) attending/consulting [facility] provider listed and b) the [facility] team listed Log into [email or web address] and use [place]'s universal password to access. If you do not have the password, please contact the hospital operator. Locate the [facility] provider you are looking for under [facility] and page to a number that you can be directly reached. If you still have difficulty reaching the provider, please page the [facility] (Director on Call) for the Hospitalists listed on amion for assistance.  06/01/2021, 8:44 AM

## 2021-06-01 NOTE — Progress Notes (Signed)
Patient arrived to 5N15 in NAD, VS stable and patient free from pain. Patient will notify family about transfer.

## 2021-06-02 DIAGNOSIS — J9621 Acute and chronic respiratory failure with hypoxia: Secondary | ICD-10-CM | POA: Diagnosis not present

## 2021-06-02 DIAGNOSIS — U071 COVID-19: Secondary | ICD-10-CM | POA: Diagnosis not present

## 2021-06-02 DIAGNOSIS — I4891 Unspecified atrial fibrillation: Secondary | ICD-10-CM | POA: Diagnosis not present

## 2021-06-02 DIAGNOSIS — E785 Hyperlipidemia, unspecified: Secondary | ICD-10-CM | POA: Diagnosis not present

## 2021-06-02 DIAGNOSIS — I10 Essential (primary) hypertension: Secondary | ICD-10-CM

## 2021-06-02 DIAGNOSIS — I340 Nonrheumatic mitral (valve) insufficiency: Secondary | ICD-10-CM

## 2021-06-02 LAB — GLUCOSE, CAPILLARY
Glucose-Capillary: 97 mg/dL (ref 70–99)
Glucose-Capillary: 97 mg/dL (ref 70–99)
Glucose-Capillary: 209 mg/dL — ABNORMAL HIGH (ref 70–99)
Glucose-Capillary: 256 mg/dL — ABNORMAL HIGH (ref 70–99)
Glucose-Capillary: 182 mg/dL — ABNORMAL HIGH (ref 70–99)

## 2021-06-02 LAB — COMPREHENSIVE METABOLIC PANEL
ALT: 618 U/L — ABNORMAL HIGH (ref 0–44)
AST: 1365 U/L — ABNORMAL HIGH (ref 15–41)
Albumin: 2.6 g/dL — ABNORMAL LOW (ref 3.5–5.0)
Alkaline Phosphatase: 119 U/L (ref 38–126)
Anion gap: 22 — ABNORMAL HIGH (ref 5–15)
BUN: 103 mg/dL — ABNORMAL HIGH (ref 8–23)
CO2: 21 mmol/L — ABNORMAL LOW (ref 22–32)
Calcium: 7.8 mg/dL — ABNORMAL LOW (ref 8.9–10.3)
Chloride: 89 mmol/L — ABNORMAL LOW (ref 98–111)
Creatinine, Ser: 7.53 mg/dL — ABNORMAL HIGH (ref 0.61–1.24)
GFR, Estimated: 7 mL/min — ABNORMAL LOW (ref >60)
Glucose, Bld: 110 mg/dL — ABNORMAL HIGH (ref 70–99)
Potassium: 5.3 mmol/L — ABNORMAL HIGH (ref 3.5–5.1)
Sodium: 132 mmol/L — ABNORMAL LOW (ref 135–145)
Total Bilirubin: 0.8 mg/dL (ref 0.3–1.2)
Total Protein: 6.8 g/dL (ref 6.5–8.1)

## 2021-06-02 LAB — CBC
HCT: 23.7 % — ABNORMAL LOW (ref 39.0–52.0)
Hemoglobin: 7.2 g/dL — ABNORMAL LOW (ref 13.0–17.0)
MCH: 28.1 pg (ref 26.0–34.0)
MCHC: 30.4 g/dL (ref 30.0–36.0)
MCV: 92.6 fL (ref 80.0–100.0)
Platelets: 258 10*3/uL (ref 150–400)
RBC: 2.56 MIL/uL — ABNORMAL LOW (ref 4.22–5.81)
RDW: 18 % — ABNORMAL HIGH (ref 11.5–15.5)
WBC: 10.6 10*3/uL — ABNORMAL HIGH (ref 4.0–10.5)
nRBC: 1.4 % — ABNORMAL HIGH (ref 0.0–0.2)

## 2021-06-02 LAB — PHOSPHORUS: Phosphorus: >30 mg/dL — ABNORMAL HIGH (ref 2.5–4.6)

## 2021-06-02 LAB — FERRITIN: Ferritin: >7500 ng/mL — ABNORMAL HIGH (ref 24–336)

## 2021-06-02 LAB — C-REACTIVE PROTEIN: CRP: 13 mg/dL — ABNORMAL HIGH (ref <1.0)

## 2021-06-02 LAB — D-DIMER, QUANTITATIVE: D-Dimer, Quant: 1.63 ug/mL-FEU — ABNORMAL HIGH (ref 0.00–0.50)

## 2021-06-02 LAB — MAGNESIUM: Magnesium: 2.7 mg/dL — ABNORMAL HIGH (ref 1.7–2.4)

## 2021-06-02 MED ORDER — LOPERAMIDE HCL 2 MG PO CAPS
2.0000 mg | ORAL_CAPSULE | Freq: Once | ORAL | Status: AC
  Administered 2021-06-02: 2 mg via ORAL
  Filled 2021-06-02: qty 1

## 2021-06-02 MED ORDER — INSULIN ASPART 100 UNIT/ML IJ SOLN: 0.0000 [IU] | Freq: Every day | INTRAMUSCULAR | Status: DC

## 2021-06-02 MED ORDER — CHLORHEXIDINE GLUCONATE CLOTH 2 % EX PADS
6.0000 | MEDICATED_PAD | Freq: Every day | CUTANEOUS | Status: DC
  Administered 2021-06-02 – 2021-06-08 (×6): 6 via TOPICAL

## 2021-06-02 MED ORDER — APIXABAN 5 MG PO TABS
5.0000 mg | ORAL_TABLET | Freq: Two times a day (BID) | ORAL | Status: DC
  Administered 2021-06-02 – 2021-06-10 (×12): 5 mg via ORAL
  Filled 2021-06-02 (×14): qty 1

## 2021-06-02 MED ORDER — FERRIC CITRATE 1 GM 210 MG(FE) PO TABS
840.0000 mg | ORAL_TABLET | Freq: Three times a day (TID) | ORAL | Status: DC
  Administered 2021-06-02 – 2021-06-03 (×5): 840 mg via ORAL
  Filled 2021-06-02 (×9): qty 4

## 2021-06-02 MED ORDER — INSULIN ASPART 100 UNIT/ML IJ SOLN
0.0000 [IU] | Freq: Three times a day (TID) | INTRAMUSCULAR | Status: DC
  Administered 2021-06-02 – 2021-06-03 (×2): 5 [IU] via SUBCUTANEOUS
  Administered 2021-06-03: 2 [IU] via SUBCUTANEOUS
  Administered 2021-06-03: 7 [IU] via SUBCUTANEOUS

## 2021-06-02 MED ORDER — SODIUM ZIRCONIUM CYCLOSILICATE 10 G PO PACK
10.0000 g | PACK | Freq: Once | ORAL | Status: AC
  Administered 2021-06-02: 10 g via ORAL
  Filled 2021-06-02: qty 1

## 2021-06-02 NOTE — Progress Notes (Signed)
Ok to adjust apixaban to 5mg  BID since pt only meets on criteria (Scr>1.5) per Dr. Eliseo Squires.  Onnie Boer, PharmD, BCIDP, AAHIVP, CPP Infectious Disease Pharmacist 06/02/2021 12:50 PM

## 2021-06-02 NOTE — Discharge Instructions (Addendum)
Information on my medicine - ELIQUIS (apixaban)  This medication education was reviewed with me or my healthcare representative as part of my discharge preparation.  You were taking this medication prior to this hospital admission.  Dose adjusted appropriately to Apixaban 5 mg (1 tablet) twice daily.   Why was Eliquis prescribed for you? Eliquis was prescribed for you to reduce the risk of a blood clot forming that can cause a stroke if you have a medical condition called atrial fibrillation (a type of irregular heartbeat).  What do You need to know about Eliquis ? Take your Eliquis TWICE DAILY - one tablet in the morning and one tablet in the evening with or without food. If you have difficulty swallowing the tablet whole please discuss with your pharmacist how to take the medication safely.  Take Eliquis exactly as prescribed by your doctor and DO NOT stop taking Eliquis without talking to the doctor who prescribed the medication.  Stopping may increase your risk of developing a stroke.  Refill your prescription before you run out.  After discharge, you should have regular check-up appointments with your healthcare provider that is prescribing your Eliquis.  In the future your dose may need to be changed if your kidney function or weight changes by a significant amount or as you get older.  What do you do if you miss a dose? If you miss a dose, take it as soon as you remember on the same day and resume taking twice daily.  Do not take more than one dose of ELIQUIS at the same time to make up a missed dose.  Important Safety Information A possible side effect of Eliquis is bleeding. You should call your healthcare provider right away if you experience any of the following: Bleeding from an injury or your nose that does not stop. Unusual colored urine (red or dark brown) or unusual colored stools (red or black). Unusual bruising for unknown reasons. A serious fall or if you hit your  head (even if there is no bleeding).  Some medicines may interact with Eliquis and might increase your risk of bleeding or clotting while on Eliquis. To help avoid this, consult your healthcare provider or pharmacist prior to using any new prescription or non-prescription medications, including herbals, vitamins, non-steroidal anti-inflammatory drugs (NSAIDs) and supplements.  This website has more information on Eliquis (apixaban): http://www.eliquis.com/eliquis/home

## 2021-06-02 NOTE — Progress Notes (Signed)
Progress Note    Richard Hall  LPF:[id number] DOB: 15-Jan-1956  DOA: 05/31/2021 PCP: [name], MD    Brief Narrative:     Medical records reviewed and are as summarized below:  Richard Hall is an 66 y.o. male with medical history significant of anemia, atrial fibrillation, CHF, ESRD on HD, chronic respiratory failure, diabetes, neuropathy, hyperlipidemia, hypertension, hypothyroidism, OSA, history of CVA, prostate cancer who presents from dialysis with shortness of breath.  Found to be COVID + also in a fib with RVR.  Initially required Bipap and was placed in ICU under [place]. Was able to come off Bipap and was transferred to the floor.     Assessment/Plan:    Acute on chronic respiratory failure with hypoxia -off bipap -wears 2L PRN at home -wean to 2 as able and off if able -CPAP QHS and while napping  COVID-19 infection -daily labs -IV Steroids -IV remdesivir stopped due to increased AST/ALT  CHF exacerbation/Atrial fibrillation with RVR/Volume overload > Patient presenting with acute respiratory failure/shortness of breath from dialysis.  > Outside records show recent echo status post watchman procedure for A. fib showed EF 50-55% -cardiology consulted:  - Continue with Lasix twice daily until able to get dialysis. - Strict I's and O's/daily weights -- Continue home Eliquis but at the correct dose, metoprolol  -amiodarone per cards  Diabetes - SSI  -d/c glipizide  -add low dose semglee tonight as he is on steroids and will be eating-- adjust as needed   ESRD > On dialysis Monday Wednesday Friday.  Completed 4 hours of dialysis today.  Electrolytes are stable but is volume overloaded. > Nephrology consulted by myself - Treating with Lasix as above for now - Dialysis needed   Transaminitis > Noted to have new AST and ALT elevation to 463 and 229 respectively.  Other LFTs are normal.  Possibility of shock liver in the setting of volume overload A.  fib and CHF exacerbation as above.   -hepatitis panel negative -Patient  COVID-positive so likely etiology for transaminitis.   -trending up so will need close monitoring   Anemia: anemia of chronic kidney disease > Hgb trending down -type and screen -? Volume dilution -no reported bleeding -transfuse with HD?  Hypothyroidism - Continue home Synthroid  # Severe mitral regurgitaiton:  Noted on TTE.  Recommended TEE as an outpatient once recovered from COVID-19.  Prostate cancer - Continue home tamsulosin   Hypertension - Continue home metoprolol -resume HD   Hyperkalemia [place]  Hyponatremia -suspect due to volume overload -daily labs  Hyperlipidemia History of CVA - hold statin as liver enzymes elevated   OSA - On BiPAP/CPAP QHS now   obesity Estimated body mass index is 36.06 kg/m as calculated from the following:   Height as of this encounter: 6' (1.829 m).   Weight as of this encounter: 120.6 kg.    Family Communication/Anticipated D/C date and plan/Code Status   DVT prophylaxis: eliquis Code Status: Full Code.  Disposition Plan: Status is: inpt    Medical Consultants:   Cards renal  Subjective:   C/o food not tasting well  Objective:    Vitals:   06/02/21 0512 06/02/21 0819 06/02/21 0900 06/02/21 1320  BP: 106/83 102/75 119/85 110/66  Pulse: 98     Resp: 15     Temp: 98.3 F (36.8 C) 97.6 F (36.4 C)  98.1 F (36.7 C)  TempSrc: Axillary Oral  Oral  SpO2: 98%   99%  Weight:      Height:        Intake/Output Summary (Last 24 hours) at 06/02/2021 1426 Last data filed at 06/01/2021 1701 Gross per 24 hour  Intake 50.03 ml  Output --  Net 50.03 ml   Filed Weights   06/01/21 0130 06/02/21 0500  Weight: 119.4 kg 120.6 kg    Exam:   General: Appearance:    Obese male in no acute distress     Lungs:      respirations unlabored  Heart:    Normal heart rate.   MS:   All extremities are intact.    Neurologic:   Awake, alert         Data Reviewed:   I have personally reviewed following labs and imaging studies:  Labs: Labs show the following:   Basic Metabolic Panel: Recent Labs  Lab 05/31/21 1700 05/31/21 1717 06/01/21 0232 06/02/21 0214  NA 134* 132*   133* 131* 132*  K 3.9 3.8   3.8 4.5 5.3*  CL 91* 91* 90* 89*  CO2 27  --  23 21*  GLUCOSE 93 88 211* 110*  BUN 49* 52* 60* 103*  CREATININE 5.09* 5.10* 6.01* 7.53*  CALCIUM 7.8*  --  7.7* 7.8*  MG  --   --  2.4 2.7*  PHOS  --   --  8.5* >30.0*   GFR Estimated Creatinine Clearance: 12.9 mL/min (A) (by C-G formula based on SCr of 7.53 mg/dL (H)). Liver Function Tests: Recent Labs  Lab 05/31/21 1700 06/01/21 0232 06/02/21 0214  AST 463* 468* 1,365*  ALT 229* 245* 618*  ALKPHOS 112 87 119  BILITOT 0.4 0.7 0.8  PROT 7.7 6.8 6.8  ALBUMIN 3.0* 2.7* 2.6*   No results for input(s): LIPASE, AMYLASE in the last 168 hours. No results for input(s): AMMONIA in the last 168 hours. Coagulation profile No results for input(s): INR, PROTIME in the last 168 hours.  CBC: Recent Labs  Lab 05/31/21 1700 05/31/21 1717 06/01/21 0232 06/02/21 0214  WBC 12.1*  --  6.2 10.6*  NEUTROABS 10.1*  --   --   --   HGB 8.7* 9.9*   9.9* 7.2* 7.2*  HCT 27.2* 29.0*   29.0* 22.9* 23.7*  MCV 91.0  --  92.3 92.6  PLT 269  --  217 258   Cardiac Enzymes: No results for input(s): CKTOTAL, CKMB, CKMBINDEX, TROPONINI in the last 168 hours. BNP (last 3 results) No results for input(s): PROBNP in the last 8760 hours. CBG: Recent Labs  Lab 06/01/21 2122 06/01/21 2345 06/02/21 0402 06/02/21 0822 06/02/21 1224  GLUCAP 132* 133* 97 97 209*   D-Dimer: Recent Labs    06/01/21 0232 06/02/21 0214  DDIMER 1.89* 1.63*   Hgb A1c: Recent Labs    06/01/21 0232  HGBA1C 6.1*   Lipid Profile: No results for input(s): CHOL, HDL, LDLCALC, TRIG, CHOLHDL, LDLDIRECT in the last 72 hours. Thyroid function studies: No results for input(s): TSH, T4TOTAL, T3FREE,  THYROIDAB in the last 72 hours.  Invalid input(s): FREET3 Anemia work up: Recent Labs    06/01/21 0232 06/02/21 0214  FERRITIN 5,302* >7,500*   Sepsis Labs: Recent Labs  Lab 05/31/21 1700 06/01/21 0232 06/02/21 0214  WBC 12.1* 6.2 10.6*    Microbiology Recent Results (from the past 240 hour(s))  Resp Panel by RT-PCR (Flu A&B, Covid) Nasopharyngeal Swab     Status: Abnormal   Collection Time: 05/31/21  4:48 PM   Specimen: Nasopharyngeal Swab; Nasopharyngeal(NP)  swabs in vial transport medium  Result Value Ref Range Status   SARS Coronavirus 2 by RT PCR POSITIVE (A) NEGATIVE Final    Comment: (NOTE) SARS-CoV-2 target nucleic acids are DETECTED.  The SARS-CoV-2 RNA is generally detectable in upper respiratory specimens during the acute phase of infection. Positive results are indicative of the presence of the identified virus, but do not rule out bacterial infection or co-infection with other pathogens not detected by the test. Clinical correlation with patient history and other diagnostic information is necessary to determine patient infection status. The expected result is Negative.  Fact Sheet for Patients: EntrepreneurPulse.com.au  Fact Sheet for Healthcare Providers: IncredibleEmployment.be  This test is not yet approved or cleared by the [place] FDA and  has been authorized for detection and/or diagnosis of SARS-CoV-2 by FDA under an Emergency Use Authorization (EUA).  This EUA will remain in effect (meaning this test can be used) for the duration of  the COVID-19 declaration under Section 564(b)(1) of the A ct, 21 U.S.C. section 360bbb-3(b)(1), unless the authorization is terminated or revoked sooner.     Influenza A by PCR NEGATIVE NEGATIVE Final   Influenza B by PCR NEGATIVE NEGATIVE Final    Comment: (NOTE) The Xpert Xpress SARS-CoV-2/FLU/RSV plus assay is intended as an aid in the diagnosis of influenza from  Nasopharyngeal swab specimens and should not be used as a sole basis for treatment. Nasal washings and aspirates are unacceptable for Xpert Xpress SARS-CoV-2/FLU/RSV testing.  Fact Sheet for Patients: EntrepreneurPulse.com.au  Fact Sheet for Healthcare Providers: IncredibleEmployment.be  This test is not yet approved or cleared by the [place] FDA and has been authorized for detection and/or diagnosis of SARS-CoV-2 by FDA under an Emergency Use Authorization (EUA). This EUA will remain in effect (meaning this test can be used) for the duration of the COVID-19 declaration under Section 564(b)(1) of the Act, 21 U.S.C. section 360bbb-3(b)(1), unless the authorization is terminated or revoked.  Performed at [facility], [place] [address] [place], [place] [address]   MRSA Next Gen by PCR, Nasal     Status: None   Collection Time: 06/01/21  1:23 AM   Specimen: Nasal Mucosa; Nasal Swab  Result Value Ref Range Status   MRSA by PCR Next Gen NOT DETECTED NOT DETECTED Final    Comment: (NOTE) The GeneXpert MRSA Assay (FDA approved for NASAL specimens only), is one component of a comprehensive MRSA colonization surveillance program. It is not intended to diagnose MRSA infection nor to guide or monitor treatment for MRSA infections. Test performance is not FDA approved in patients less than 36 years old. Performed at [facility], [place] [address], [place], [place] [address]     Procedures and diagnostic studies:  DG Chest Port 1 View  Result Date: 05/31/2021 CLINICAL DATA:  Shortness of breath EXAM: PORTABLE CHEST 1 VIEW COMPARISON:  01/05/2019 FINDINGS: Transverse diameter of heart is increased. Increased interstitial markings are seen in the right parahilar region and right lower lung fields. There is no focal consolidation. Lateral CP angles are not included in their entirety. There is no significant pleural effusion or pneumothorax.  IMPRESSION: Cardiomegaly. Increased interstitial markings are seen in the right parahilar region and right lower lung fields. This may suggest asymmetric pulmonary edema or interstitial pneumonitis. Electronically Signed   By: Elmer Picker M.D.   On: 05/31/2021 17:09   ECHOCARDIOGRAM COMPLETE  Result Date: 06/01/2021    ECHOCARDIOGRAM REPORT   Patient Name:  Elijiah RAY Hildebran Date of Exam: 06/01/2021 Medical Rec #:  [id number]        Height:       72.0 in Accession #:    [id number]       Weight:       263.2 lb Date of Birth:  Mar 11, 1956         BSA:          2.394 m Patient Age:    51 years         BP:           126/80 mmHg Patient Gender: M                HR:           91 bpm. Exam Location:  Inpatient Procedure: 2D Echo, Cardiac Doppler and Color Doppler Indications:    Dsypnea  History:        Patient has no prior history of Echocardiogram examinations.                 Covid 19 positive.  Sonographer:    Merrie Roof RDCS Referring Phys: [id number] [place]  1. Left ventricular ejection fraction, by estimation, is 60 to 65%. The left ventricle has normal function. The left ventricle has no regional wall motion abnormalities. There is mild left ventricular hypertrophy. Left ventricular diastolic parameters were normal.  2. Right ventricular systolic function is normal. The right ventricular size is normal. There is moderately elevated pulmonary artery systolic pressure.  3. Left atrial size was moderately dilated.  4. There is significant annular calcification and calcification/thickening of mitrla leaflets PISA radius 1.1 with PISA ERO 55 mm 2 suggest severe MR Consider TEE if clinically indicated when acute COVID respiratory precautions done . The mitral valve is abnormal. Severe mitral valve regurgitation. No evidence of mitral stenosis. Moderate mitral annular calcification.  5. The aortic valve is tricuspid. There is moderate calcification of the aortic valve. There is moderate  thickening of the aortic valve. Aortic valve regurgitation is mild. Aortic valve sclerosis/calcification is present, without any evidence of aortic stenosis.  6. Aortic dilatation noted. There is moderate dilatation of the ascending aorta, measuring 43 mm.  7. The inferior vena cava is normal in size with greater than 50% respiratory variability, suggesting right atrial pressure of 3 mmHg. FINDINGS  Left Ventricle: Left ventricular ejection fraction, by estimation, is 60 to 65%. The left ventricle has normal function. The left ventricle has no regional wall motion abnormalities. The left ventricular internal cavity size was normal in size. There is  mild left ventricular hypertrophy. Left ventricular diastolic parameters were normal. Right Ventricle: The right ventricular size is normal. No increase in right ventricular wall thickness. Right ventricular systolic function is normal. There is moderately elevated pulmonary artery systolic pressure. The tricuspid regurgitant velocity is 3.16 m/s, and with an assumed right atrial pressure of 15 mmHg, the estimated right ventricular systolic pressure is 96.2 mmHg. Left Atrium: Left atrial size was moderately dilated. Right Atrium: Right atrial size was normal in size. Pericardium: There is no evidence of pericardial effusion. Mitral Valve: There is significant annular calcification and calcification/thickening of mitrla leaflets PISA radius 1.1 with PISA ERO 55 mm 2 suggest severe MR Consider TEE if clinically indicated when acute COVID respiratory precautions done. The mitral valve is abnormal. There is moderate thickening of the mitral valve leaflet(s). There is moderate calcification of the mitral valve leaflet(s). Moderate mitral annular calcification. Severe mitral valve  regurgitation. No evidence of mitral valve stenosis. Tricuspid Valve: The tricuspid valve is normal in structure. Tricuspid valve regurgitation is mild . No evidence of tricuspid stenosis. Aortic  Valve: The aortic valve is tricuspid. There is moderate calcification of the aortic valve. There is moderate thickening of the aortic valve. Aortic valve regurgitation is mild. Aortic regurgitation PHT measures 537 msec. Aortic valve sclerosis/calcification is present, without any evidence of aortic stenosis. Pulmonic Valve: The pulmonic valve was normal in structure. Pulmonic valve regurgitation is not visualized. No evidence of pulmonic stenosis. Aorta: Aortic dilatation noted. There is moderate dilatation of the ascending aorta, measuring 43 mm. Venous: The inferior vena cava is normal in size with greater than 50% respiratory variability, suggesting right atrial pressure of 3 mmHg. IAS/Shunts: No atrial level shunt detected by color flow Doppler.  LEFT VENTRICLE PLAX 2D LVIDd:         5.90 cm LVIDs:         4.70 cm LV PW:         1.40 cm LV IVS:        1.30 cm  RIGHT VENTRICLE          IVC RV Basal diam:  4.20 cm  IVC diam: 2.30 cm RV Mid diam:    3.80 cm LEFT ATRIUM            Index        RIGHT ATRIUM           Index LA diam:      5.00 cm  2.09 cm/m   RA Area:     27.00 cm LA Vol (A2C): 146.0 ml 60.98 ml/m  RA Volume:   94.30 ml  39.38 ml/m LA Vol (A4C): 151.0 ml 63.06 ml/m  AORTIC VALVE LVOT Vmax:   76.20 cm/s LVOT Vmean:  52.100 cm/s LVOT VTI:    0.149 m AI PHT:      537 msec  AORTA Ao Root diam: 4.10 cm Ao Asc diam:  4.30 cm MR Peak grad:    101.6 mmHg   TRICUSPID VALVE MR Mean grad:    64.0 mmHg    TR Peak grad:   39.9 mmHg MR Vmax:         504.00 cm/s  TR Vmax:        316.00 cm/s MR Vmean:        378.0 cm/s MR PISA:         7.60 cm     SHUNTS MR PISA Eff ROA: 55 mm       Systemic VTI: 0.15 m MR PISA Radius:  1.10 cm Jenkins Rouge MD Electronically signed by Jenkins Rouge MD Signature Date/Time: 06/01/2021/11:52:58 AM    Final     Medications:    [START ON 06/16/2021] amiodarone  200 mg Oral Daily   amiodarone  200 mg Oral BID   apixaban  5 mg Oral BID   chlorhexidine  15 mL Mouth Rinse BID    Chlorhexidine Gluconate Cloth  6 each Topical Q0600   Chlorhexidine Gluconate Cloth  6 each Topical Q0600   dexamethasone (DECADRON) injection  6 mg Intravenous Q24H   ferric citrate  840 mg Oral TID WC   furosemide  80 mg Intravenous BID   insulin aspart  0-20 Units Subcutaneous Q4H   insulin glargine-yfgn  10 Units Subcutaneous QHS   levothyroxine  100 mcg Oral QAC breakfast   linaclotide  72 mcg Oral QAC breakfast   mouth rinse  15 mL  Mouth Rinse q12n4p   metoprolol tartrate  25 mg Oral BID   sertraline  100 mg Oral Daily   sodium chloride flush  3 mL Intravenous Q12H   tamsulosin  0.4 mg Oral Daily   Continuous Infusions:  amiodarone Stopped (06/01/21 1801)     LOS: 1 day   Geradine Girt  [facility]   How to contact the [facility] Attending or Consulting provider [place] or covering provider during after hours [place], for this patient?  Check the care team in [facility] and look for a) attending/consulting [facility] provider listed and b) the [facility] team listed Log into [email or web address] and use [place]'s universal password to access. If you do not have the password, please contact the hospital operator. Locate the [facility] provider you are looking for under [facility] and page to a number that you can be directly reached. If you still have difficulty reaching the provider, please page the [facility] (Director on Call) for the Hospitalists listed on amion for assistance.  06/02/2021, 2:26 PM

## 2021-06-02 NOTE — Progress Notes (Signed)
Progress Note  Patient Name: Richard Hall Date of Encounter: 06/02/2021  [facility] Cardiologist: None   Subjective   Feeling much better.  No shortness of breath.   Inpatient Medications    Scheduled Meds:  [START ON 06/16/2021] amiodarone  200 mg Oral Daily   amiodarone  200 mg Oral BID   apixaban  2.5 mg Oral BID   chlorhexidine  15 mL Mouth Rinse BID   Chlorhexidine Gluconate Cloth  6 each Topical Q0600   Chlorhexidine Gluconate Cloth  6 each Topical Q0600   dexamethasone (DECADRON) injection  6 mg Intravenous Q24H   ferric citrate  840 mg Oral TID WC   furosemide  80 mg Intravenous BID   insulin aspart  0-20 Units Subcutaneous Q4H   insulin glargine-yfgn  10 Units Subcutaneous QHS   levothyroxine  100 mcg Oral QAC breakfast   linaclotide  72 mcg Oral QAC breakfast   mouth rinse  15 mL Mouth Rinse q12n4p   metoprolol tartrate  25 mg Oral BID   sertraline  100 mg Oral Daily   sodium chloride flush  3 mL Intravenous Q12H   tamsulosin  0.4 mg Oral Daily   Continuous Infusions:  amiodarone Stopped (06/01/21 1801)   PRN Meds: acetaminophen **OR** acetaminophen, polyethylene glycol   Vital Signs    Vitals:   06/02/21 0500 06/02/21 0512 06/02/21 0819 06/02/21 0900  BP:  106/83 102/75 119/85  Pulse:  98    Resp:  15    Temp:  98.3 F (36.8 C) 97.6 F (36.4 C)   TempSrc:  Axillary Oral   SpO2:  98%    Weight: 120.6 kg     Height:        Intake/Output Summary (Last 24 hours) at 06/02/2021 1015 Last data filed at 06/01/2021 1701 Gross per 24 hour  Intake 116.71 ml  Output --  Net 116.71 ml   Last 3 Weights 06/02/2021 06/01/2021 07/26/2020  Weight (lbs) 265 lb 14 oz 263 lb 3.7 oz 276 lb 3.2 oz  Weight (kg) 120.6 kg 119.4 kg 125.283 kg      Telemetry    Atrial fibrillation.  Rate <100 bpm.  - Personally Reviewed  ECG     Atrial fibrillation.  Rate 155 bpm.  - Personally Reviewed  Physical Exam   GEN: Chronically ill-appearing.  No acute  distress.   Neck: No JVD Cardiac: Irregularly irregular, III/VI systolic murmur, rubs, or gallops.  Respiratory: Clear to auscultation bilaterally. GI: Soft, nontender, non-distended  MS: No edema; No deformity. Neuro:  Nonfocal  Psych: Normal affect   Labs    High Sensitivity Troponin:   Recent Labs  Lab 05/31/21 1700 05/31/21 1851  TROPONINIHS 707* 660*     Chemistry Recent Labs  Lab 05/31/21 1700 05/31/21 1717 06/01/21 0232 06/02/21 0214  NA 134* 132*   133* 131* 132*  K 3.9 3.8   3.8 4.5 5.3*  CL 91* 91* 90* 89*  CO2 27  --  23 21*  GLUCOSE 93 88 211* 110*  BUN 49* 52* 60* 103*  CREATININE 5.09* 5.10* 6.01* 7.53*  CALCIUM 7.8*  --  7.7* 7.8*  MG  --   --  2.4 2.7*  PROT 7.7  --  6.8 6.8  ALBUMIN 3.0*  --  2.7* 2.6*  AST 463*  --  468* 1,365*  ALT 229*  --  245* 618*  ALKPHOS 112  --  87 119  BILITOT 0.4  --  0.7 0.8  GFRNONAA 12*  --  10* 7*  ANIONGAP 16*  --  18* 22*    Lipids No results for input(s): CHOL, TRIG, HDL, LABVLDL, LDLCALC, CHOLHDL in the last 168 hours.  Hematology Recent Labs  Lab 05/31/21 1700 05/31/21 1717 06/01/21 0232 06/02/21 0214  WBC 12.1*  --  6.2 10.6*  RBC 2.99*  --  2.48* 2.56*  HGB 8.7* 9.9*   9.9* 7.2* 7.2*  HCT 27.2* 29.0*   29.0* 22.9* 23.7*  MCV 91.0  --  92.3 92.6  MCH 29.1  --  29.0 28.1  MCHC 32.0  --  31.4 30.4  RDW 18.2*  --  18.1* 18.0*  PLT 269  --  217 258   Thyroid No results for input(s): TSH, FREET4 in the last 168 hours.  BNP Recent Labs  Lab 05/31/21 1701  BNP 1,212.6*    DDimer  Recent Labs  Lab 06/01/21 0232 06/02/21 0214  DDIMER 1.89* 1.63*     Radiology    DG Chest Port 1 View  Result Date: 05/31/2021 CLINICAL DATA:  Shortness of breath EXAM: PORTABLE CHEST 1 VIEW COMPARISON:  01/05/2019 FINDINGS: Transverse diameter of heart is increased. Increased interstitial markings are seen in the right parahilar region and right lower lung fields. There is no focal consolidation. Lateral CP  angles are not included in their entirety. There is no significant pleural effusion or pneumothorax. IMPRESSION: Cardiomegaly. Increased interstitial markings are seen in the right parahilar region and right lower lung fields. This may suggest asymmetric pulmonary edema or interstitial pneumonitis. Electronically Signed   By: Elmer Picker M.D.   On: 05/31/2021 17:09   ECHOCARDIOGRAM COMPLETE  Result Date: 06/01/2021    ECHOCARDIOGRAM REPORT   Patient Name:   Richard Hall Date of Exam: 06/01/2021 Medical Rec #:  [id number]        Height:       72.0 in Accession #:    [id number]       Weight:       263.2 lb Date of Birth:  06/18/1955         BSA:          2.394 m Patient Age:    66 years         BP:           126/80 mmHg Patient Gender: M                HR:           91 bpm. Exam Location:  Inpatient Procedure: 2D Echo, Cardiac Doppler and Color Doppler Indications:    Dsypnea  History:        Patient has no prior history of Echocardiogram examinations.                 Covid 19 positive.  Sonographer:    Merrie Roof RDCS Referring Phys: [id number] [place]  1. Left ventricular ejection fraction, by estimation, is 60 to 65%. The left ventricle has normal function. The left ventricle has no regional wall motion abnormalities. There is mild left ventricular hypertrophy. Left ventricular diastolic parameters were normal.  2. Right ventricular systolic function is normal. The right ventricular size is normal. There is moderately elevated pulmonary artery systolic pressure.  3. Left atrial size was moderately dilated.  4. There is significant annular calcification and calcification/thickening of mitrla leaflets PISA radius 1.1 with PISA ERO 55 mm 2 suggest severe MR Consider TEE if clinically indicated when acute  COVID respiratory precautions done . The mitral valve is abnormal. Severe mitral valve regurgitation. No evidence of mitral stenosis. Moderate mitral annular calcification.  5. The  aortic valve is tricuspid. There is moderate calcification of the aortic valve. There is moderate thickening of the aortic valve. Aortic valve regurgitation is mild. Aortic valve sclerosis/calcification is present, without any evidence of aortic stenosis.  6. Aortic dilatation noted. There is moderate dilatation of the ascending aorta, measuring 43 mm.  7. The inferior vena cava is normal in size with greater than 50% respiratory variability, suggesting right atrial pressure of 3 mmHg. FINDINGS  Left Ventricle: Left ventricular ejection fraction, by estimation, is 60 to 65%. The left ventricle has normal function. The left ventricle has no regional wall motion abnormalities. The left ventricular internal cavity size was normal in size. There is  mild left ventricular hypertrophy. Left ventricular diastolic parameters were normal. Right Ventricle: The right ventricular size is normal. No increase in right ventricular wall thickness. Right ventricular systolic function is normal. There is moderately elevated pulmonary artery systolic pressure. The tricuspid regurgitant velocity is 3.16 m/s, and with an assumed right atrial pressure of 15 mmHg, the estimated right ventricular systolic pressure is 73.5 mmHg. Left Atrium: Left atrial size was moderately dilated. Right Atrium: Right atrial size was normal in size. Pericardium: There is no evidence of pericardial effusion. Mitral Valve: There is significant annular calcification and calcification/thickening of mitrla leaflets PISA radius 1.1 with PISA ERO 55 mm 2 suggest severe MR Consider TEE if clinically indicated when acute COVID respiratory precautions done. The mitral valve is abnormal. There is moderate thickening of the mitral valve leaflet(s). There is moderate calcification of the mitral valve leaflet(s). Moderate mitral annular calcification. Severe mitral valve regurgitation. No evidence of mitral valve stenosis. Tricuspid Valve: The tricuspid valve is normal  in structure. Tricuspid valve regurgitation is mild . No evidence of tricuspid stenosis. Aortic Valve: The aortic valve is tricuspid. There is moderate calcification of the aortic valve. There is moderate thickening of the aortic valve. Aortic valve regurgitation is mild. Aortic regurgitation PHT measures 537 msec. Aortic valve sclerosis/calcification is present, without any evidence of aortic stenosis. Pulmonic Valve: The pulmonic valve was normal in structure. Pulmonic valve regurgitation is not visualized. No evidence of pulmonic stenosis. Aorta: Aortic dilatation noted. There is moderate dilatation of the ascending aorta, measuring 43 mm. Venous: The inferior vena cava is normal in size with greater than 50% respiratory variability, suggesting right atrial pressure of 3 mmHg. IAS/Shunts: No atrial level shunt detected by color flow Doppler.  LEFT VENTRICLE PLAX 2D LVIDd:         5.90 cm LVIDs:         4.70 cm LV PW:         1.40 cm LV IVS:        1.30 cm  RIGHT VENTRICLE          IVC RV Basal diam:  4.20 cm  IVC diam: 2.30 cm RV Mid diam:    3.80 cm LEFT ATRIUM            Index        RIGHT ATRIUM           Index LA diam:      5.00 cm  2.09 cm/m   RA Area:     27.00 cm LA Vol (A2C): 146.0 ml 60.98 ml/m  RA Volume:   94.30 ml  39.38 ml/m LA Vol (A4C): 151.0 ml 63.06  ml/m  AORTIC VALVE LVOT Vmax:   76.20 cm/s LVOT Vmean:  52.100 cm/s LVOT VTI:    0.149 m AI PHT:      537 msec  AORTA Ao Root diam: 4.10 cm Ao Asc diam:  4.30 cm MR Peak grad:    101.6 mmHg   TRICUSPID VALVE MR Mean grad:    64.0 mmHg    TR Peak grad:   39.9 mmHg MR Vmax:         504.00 cm/s  TR Vmax:        316.00 cm/s MR Vmean:        378.0 cm/s MR PISA:         7.60 cm     SHUNTS MR PISA Eff ROA: 55 mm       Systemic VTI: 0.15 m MR PISA Radius:  1.10 cm Jenkins Rouge MD Electronically signed by Jenkins Rouge MD Signature Date/Time: 06/01/2021/11:52:58 AM    Final     Cardiac Studies   Echo 06/01/21: 1. Left ventricular ejection fraction,  by estimation, is 60 to 65%. The  left ventricle has normal function. The left ventricle has no regional  wall motion abnormalities. There is mild left ventricular hypertrophy.  Left ventricular diastolic parameters  were normal.   2. Right ventricular systolic function is normal. The right ventricular  size is normal. There is moderately elevated pulmonary artery systolic  pressure.   3. Left atrial size was moderately dilated.   4. There is significant annular calcification and  calcification/thickening of mitrla leaflets PISA radius 1.1 with PISA ERO  55 mm 2 suggest severe MR Consider TEE if clinically indicated when acute  COVID respiratory precautions done . The mitral valve  is abnormal. Severe mitral valve regurgitation. No evidence of mitral  stenosis. Moderate mitral annular calcification.   5. The aortic valve is tricuspid. There is moderate calcification of the  aortic valve. There is moderate thickening of the aortic valve. Aortic  valve regurgitation is mild. Aortic valve sclerosis/calcification is  present, without any evidence of aortic  stenosis.   6. Aortic dilatation noted. There is moderate dilatation of the ascending  aorta, measuring 43 mm.   7. The inferior vena cava is normal in size with greater than 50%  respiratory variability, suggesting right atrial pressure of 3 mmHg.   Patient Profile     66 y.o. male with paroxysmal atrial fibrillation, chronic diastolic heart failure, prior stroke, recurrent GI bleed, status post Watchman device 04/18/2021, prostate cancer, and ESRD on HD admitted with hypoxic respiratory failure, COVID-19 pneumonia, acute on chronic diastolic heart failure, and atrial fibrillation with RVR.  Assessment & Plan    #Paroxysmal atrial fibrillation: Patient was admitted with A. fib with RVR.  He previously underwent a Watchman device implantation due to recurrent GI bleeding and anemia requiring iron infusion.  Currently he remains in rate  controlled atrial fibrillation.  He was started on amiodarone with improvement in his rate control.  Amiodarone transitioned to oral.  Continue amiodarone, metoprolol, and Eliquis.  Plan to treat with antiarrhythmics for 30 days.   #Hypoxic respiratory failure: #Acute on chronic diastolic heart failure: #Hypertension: #COVID-19 pneumonia: Patient developed acute onset of shortness of breath during dialysis.  He was in A. fib with RVR.  Pulmonary infiltrates on admission.  He was initially required BiPAP on admission.  He does still make some urine and was given a dose of IV Lasix.  He is receiving IV steroids and IV remdesivir for  COVID-19.  # Severe mitral regurgitaiton:  Noted on TTE.  Recommended TEE as an outpatient once recovered from COVID-19.  #ESRD on HD: Per nephrology. Missed 2 HD sessions prior to 12/30.  # CVA:  # hyperlipidemia:  Home statin on hold for now in setting of COVID-19 treatment.  Resume after.  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     For questions or updates, please contact [place] Please consult [email or web address] for contact info under        Signed, Skeet Latch, MD  06/02/2021, 10:15 AM

## 2021-06-02 NOTE — Progress Notes (Addendum)
Nephrology Follow-Up Consult note   Assessment/Recommendations: Richard Hall is a/an 66 y.o. male with a past medical history significant for ESRD on HD admitted with acute hypoxic respiratory failure secondary to COVID-pneumonia.   Outpatient dialysis unit: [facility] schedule: MWF Outpt prescription: F180, BFR 450, 4hrs, 2 K, 2 Ca, EDW 123kg   # ESRD: Missed 2 dialysis session on 12/26 and 12/28 but received full session on 12/30. Left below his EDW at 121. I think most of his hypoxia was related to covid and not excess volume. Some possible pulmonary edema from rapid heart rates and with rate control respiratory status has improved. Continue to breath well today lying flat. Plan for HD tomorrow per schedule.  # Volume/ hypertension: EDW 123kg. But left below EDW at 121 on 12/30. Will challenge EDW here. BP acceptable  # Anemia of Chronic Kidney Disease: Hemoglobin 7.2 Ferritin 5300 so cant use iron. Receives mircera 230mcg q2 weeks. Will plan to dose aranesp with next treatment. Transfuse PRN.   # Secondary Hyperparathyroidism/Hyperphosphatemia: Phos 8.5. Calcium corrects near normal when accounting for albumin. Auryxia 3 tablets with meals  # Vascular access: Left AVF with no issues  #Acute hypoxic respiratory failure secondary to COVID-pneumonia: Oxygen status has improved.  Treatment per primary team  #Atrial Fibrillation w/ RVR: Rate control improved.  Management per primary team  #Hyperkalemia: 5.3. dialysis as above, Lokelma 10g x1   # Additional recommendations: - Dose all meds for creatinine clearance < 10 ml/min  - Unless absolutely necessary, no MRIs with gadolinium.  - Implement save arm precautions.  Prefer needle sticks in the dorsum of the hands or wrists.  No blood pressure measurements in arm. - If blood transfusion is requested during hemodialysis sessions, please alert [place] prior to the session.      Recommendations conveyed to primary  service.    [facility] 06/02/2021 10:01 AM  ___________________________________________________________  CC: ESRD, COVID pna  Interval History/Subjective: Refused BiPAP overnight.  Continues to be breathing fairly well.  LFTs rising today. No other acute issues   Medications:  Current Facility-Administered Medications  Medication Dose Route Frequency Provider Last Rate Last Admin   acetaminophen (TYLENOL) tablet 650 mg  650 mg Oral Q6H PRN Marcelyn Bruins, MD       Or   acetaminophen (TYLENOL) suppository 650 mg  650 mg Rectal Q6H PRN Marcelyn Bruins, MD       amiodarone (NEXTERONE PREMIX) 360-4.14 MG/200ML-% (1.8 mg/mL) IV infusion  30 mg/hr Intravenous Continuous Marcelyn Bruins, MD   Stopped at 06/01/21 1801   [START ON 06/16/2021] amiodarone (PACERONE) tablet 200 mg  200 mg Oral Daily Skeet Latch, MD       amiodarone (PACERONE) tablet 200 mg  200 mg Oral BID Vann, Jessica U, DO   200 mg at 06/02/21 6812   apixaban (ELIQUIS) tablet 2.5 mg  2.5 mg Oral BID Marcelyn Bruins, MD   2.5 mg at 06/02/21 0859   chlorhexidine (PERIDEX) 0.12 % solution 15 mL  15 mL Mouth Rinse BID Marcelyn Bruins, MD   15 mL at 06/02/21 0903   Chlorhexidine Gluconate Cloth 2 % PADS 6 each  6 each Topical Q0600 Marcelyn Bruins, MD   6 each at 06/02/21 0508   dexamethasone (DECADRON) injection 6 mg  6 mg Intravenous Q24H Marcelyn Bruins, MD   6 mg at 06/02/21 0048   ferric citrate (AURYXIA) tablet 840 mg  840 mg Oral  TID WC Reesa Chew, MD       furosemide (LASIX) injection 80 mg  80 mg Intravenous BID Marcelyn Bruins, MD   80 mg at 06/02/21 2423   insulin aspart (novoLOG) injection 0-20 Units  0-20 Units Subcutaneous Q4H Marcelyn Bruins, MD   3 Units at 06/02/21 0050   insulin glargine-yfgn [facility]) injection 10 Units  10 Units Subcutaneous QHS Eulogio Bear U, DO   10 Units at 06/02/21 0050   levothyroxine (SYNTHROID) tablet 100  mcg  100 mcg Oral QAC breakfast Marcelyn Bruins, MD   100 mcg at 06/02/21 5361   linaclotide (LINZESS) capsule 72 mcg  72 mcg Oral QAC breakfast Marcelyn Bruins, MD   72 mcg at 06/01/21 1600   MEDLINE mouth rinse  15 mL Mouth Rinse q12n4p Marcelyn Bruins, MD       metoprolol tartrate (LOPRESSOR) tablet 25 mg  25 mg Oral BID Marcelyn Bruins, MD   25 mg at 06/02/21 4431   polyethylene glycol (MIRALAX / GLYCOLAX) packet 17 g  17 g Oral Daily PRN Marcelyn Bruins, MD       sertraline (ZOLOFT) tablet 100 mg  100 mg Oral Daily Marcelyn Bruins, MD   100 mg at 06/02/21 0902   sodium chloride flush (NS) 0.9 % injection 3 mL  3 mL Intravenous Q12H Marcelyn Bruins, MD   3 mL at 06/02/21 0905   tamsulosin (FLOMAX) capsule 0.4 mg  0.4 mg Oral Daily Marcelyn Bruins, MD   0.4 mg at 06/02/21 5400      Review of Systems: 10 systems reviewed and negative except per interval history/subjective  Physical Exam: Vitals:   06/02/21 0819 06/02/21 0900  BP: 102/75 119/85  Pulse:    Resp:    Temp: 97.6 F (36.4 C)   SpO2:     No intake/output data recorded.  Intake/Output Summary (Last 24 hours) at 06/02/2021 1001 Last data filed at 06/01/2021 1701 Gross per 24 hour  Intake 116.71 ml  Output --  Net 116.71 ml   Constitutional: Chronically ill-appearing, lying in bed flat, no distress ENMT: ears and nose without scars or lesions, MMM CV: normal rate, trace edema in the lower extremities Respiratory: Bilateral chest rise, normal work of breathing Gastrointestinal: soft, non-tender, no palpable masses or hernias Skin: no visible lesions or rashes Psych: alert, judgement/insight appropriate, appropriate mood and affect   Test Results I personally reviewed new and old clinical labs and radiology tests Lab Results  Component Value Date   NA 132 (L) 06/02/2021   K 5.3 (H) 06/02/2021   CL 89 (L) 06/02/2021   CO2 21 (L) 06/02/2021   BUN 103 (H) 06/02/2021   CREATININE  7.53 (H) 06/02/2021   CALCIUM 7.8 (L) 06/02/2021   ALBUMIN 2.6 (L) 06/02/2021   PHOS >30.0 (H) 06/02/2021

## 2021-06-03 DIAGNOSIS — I4891 Unspecified atrial fibrillation: Secondary | ICD-10-CM | POA: Diagnosis not present

## 2021-06-03 LAB — CBC
HCT: 23.3 % — ABNORMAL LOW (ref 39.0–52.0)
Hemoglobin: 7.3 g/dL — ABNORMAL LOW (ref 13.0–17.0)
MCH: 28.6 pg (ref 26.0–34.0)
MCHC: 31.3 g/dL (ref 30.0–36.0)
MCV: 91.4 fL (ref 80.0–100.0)
Platelets: 276 10*3/uL (ref 150–400)
RBC: 2.55 MIL/uL — ABNORMAL LOW (ref 4.22–5.81)
RDW: 17.6 % — ABNORMAL HIGH (ref 11.5–15.5)
WBC: 14.9 10*3/uL — ABNORMAL HIGH (ref 4.0–10.5)
nRBC: 1.3 % — ABNORMAL HIGH (ref 0.0–0.2)

## 2021-06-03 LAB — FERRITIN: Ferritin: 3920 ng/mL — ABNORMAL HIGH (ref 24–336)

## 2021-06-03 LAB — BASIC METABOLIC PANEL
Anion gap: 23 — ABNORMAL HIGH (ref 5–15)
BUN: 142 mg/dL — ABNORMAL HIGH (ref 8–23)
CO2: 17 mmol/L — ABNORMAL LOW (ref 22–32)
Calcium: 7.1 mg/dL — ABNORMAL LOW (ref 8.9–10.3)
Chloride: 87 mmol/L — ABNORMAL LOW (ref 98–111)
Creatinine, Ser: 9.07 mg/dL — ABNORMAL HIGH (ref 0.61–1.24)
GFR, Estimated: 6 mL/min — ABNORMAL LOW (ref >60)
Glucose, Bld: 184 mg/dL — ABNORMAL HIGH (ref 70–99)
Potassium: 5.5 mmol/L — ABNORMAL HIGH (ref 3.5–5.1)
Sodium: 127 mmol/L — ABNORMAL LOW (ref 135–145)

## 2021-06-03 LAB — GLUCOSE, CAPILLARY
Glucose-Capillary: 153 mg/dL — ABNORMAL HIGH (ref 70–99)
Glucose-Capillary: 164 mg/dL — ABNORMAL HIGH (ref 70–99)
Glucose-Capillary: 194 mg/dL — ABNORMAL HIGH (ref 70–99)
Glucose-Capillary: 260 mg/dL — ABNORMAL HIGH (ref 70–99)
Glucose-Capillary: 334 mg/dL — ABNORMAL HIGH (ref 70–99)

## 2021-06-03 LAB — HEPATIC FUNCTION PANEL
ALT: 494 U/L — ABNORMAL HIGH (ref 0–44)
AST: 601 U/L — ABNORMAL HIGH (ref 15–41)
Albumin: 2.6 g/dL — ABNORMAL LOW (ref 3.5–5.0)
Alkaline Phosphatase: 111 U/L (ref 38–126)
Bilirubin, Direct: <0.1 mg/dL (ref 0.0–0.2)
Total Bilirubin: 1.2 mg/dL (ref 0.3–1.2)
Total Protein: 6.5 g/dL (ref 6.5–8.1)

## 2021-06-03 LAB — D-DIMER, QUANTITATIVE: D-Dimer, Quant: 1.33 ug/mL-FEU — ABNORMAL HIGH (ref 0.00–0.50)

## 2021-06-03 LAB — MAGNESIUM: Magnesium: 2.8 mg/dL — ABNORMAL HIGH (ref 1.7–2.4)

## 2021-06-03 LAB — C-REACTIVE PROTEIN: CRP: 13.8 mg/dL — ABNORMAL HIGH (ref <1.0)

## 2021-06-03 LAB — PHOSPHORUS: Phosphorus: >30 mg/dL — ABNORMAL HIGH (ref 2.5–4.6)

## 2021-06-03 MED ORDER — DIPHENOXYLATE-ATROPINE 2.5-0.025 MG PO TABS
1.0000 | ORAL_TABLET | Freq: Four times a day (QID) | ORAL | Status: DC | PRN
  Administered 2021-06-03: 1 via ORAL
  Filled 2021-06-03 (×2): qty 1

## 2021-06-03 MED ORDER — PREDNISONE 20 MG PO TABS
40.0000 mg | ORAL_TABLET | Freq: Every day | ORAL | Status: DC
  Filled 2021-06-03: qty 2

## 2021-06-03 MED ORDER — INSULIN GLARGINE-YFGN 100 UNIT/ML ~~LOC~~ SOLN
20.0000 [IU] | Freq: Every day | SUBCUTANEOUS | Status: DC
  Administered 2021-06-04: 20 [IU] via SUBCUTANEOUS
  Filled 2021-06-03 (×2): qty 0.2

## 2021-06-03 MED ORDER — DARBEPOETIN ALFA 200 MCG/0.4ML IJ SOSY
200.0000 ug | PREFILLED_SYRINGE | INTRAMUSCULAR | Status: DC
  Filled 2021-06-03: qty 0.4

## 2021-06-03 MED ORDER — ENZALUTAMIDE 40 MG PO CAPS: 160.0000 mg | ORAL_CAPSULE | Freq: Every day | ORAL | Status: DC

## 2021-06-03 NOTE — Progress Notes (Signed)
Progress Note    Richard Hall  DPO:[id number] DOB: 23-Feb-1956  DOA: 05/31/2021 PCP: [name], MD    Brief Narrative:     Medical records reviewed and are as summarized below:  Richard Hall is an 66 y.o. male with medical history significant of anemia, atrial fibrillation, CHF, ESRD on HD, chronic respiratory failure, diabetes, neuropathy, hyperlipidemia, hypertension, hypothyroidism, OSA, history of CVA, prostate cancer who presents from dialysis with shortness of breath.  Found to be COVID + also in a fib with RVR.  Initially required Bipap and was placed in ICU under [place]. Was able to come off Bipap and was transferred to the floor.   Getting HD 1/2.  PT eval pending   Assessment/Plan:    Acute on chronic respiratory failure with hypoxia -off bipap -wears 2L PRN at home -CPAP QHS and while napping  COVID-19 infection -daily labs -IV Steroids- change to PO prednisone -IV remdesivir stopped due to increased AST/ALT  CHF exacerbation/Atrial fibrillation with RVR/Volume overload > Patient presenting with acute respiratory failure/shortness of breath from dialysis.  > Outside records show recent echo status post watchman procedure for A. fib showed EF 50-55% -cardiology consulted:  - Strict I's and O's/daily weights -- Continue home Eliquis but at the correct dose, metoprolol  -amiodarone per cards  Diabetes - SSI  --increase insulin   ESRD > On dialysis Monday Wednesday Friday.  Completed 4 hours of dialysis today.  Electrolytes are stable but is volume overloaded. > Nephrology consulted  - Dialysis 1/2   Transaminitis > Noted to have new AST and ALT elevation to 463 and 229 respectively.  Other LFTs are normal.  Possibility of shock liver in the setting of volume overload A. fib and CHF exacerbation as above.   -hepatitis panel negative -Patient  COVID-positive so likely etiology for transaminitis.   -trending up so will need close monitoring    Anemia: anemia of chronic kidney disease > Hgb flat -type and screen -? Volume dilution -no reported bleeding -transfuse with HD?  Hypothyroidism - Continue home Synthroid   Severe mitral regurgitaiton:  Noted on TTE.  Recommended TEE as an outpatient once recovered from COVID-19.  Prostate cancer - Continue home tamsulosin   Hypertension - Continue home metoprolol -resume HD   Hyperkalemia [place]  Hyponatremia -suspect due to volume overload -daily labs  Hyperlipidemia History of CVA - hold statin as liver enzymes elevated   OSA - On BiPAP/CPAP QHS now   obesity Estimated body mass index is 36.06 kg/m as calculated from the following:   Height as of this encounter: 6' (1.829 m).   Weight as of this encounter: 120.6 kg.    Family Communication/Anticipated D/C date and plan/Code Status   DVT prophylaxis: eliquis Code Status: Full Code.  Disposition Plan: Status is: inpt    Medical Consultants:   Cards renal  Subjective:   Asking about going home  Objective:    Vitals:   06/02/21 2129 06/02/21 2345 06/03/21 0414 06/03/21 0803  BP: 96/64 110/76 113/77 107/65  Pulse: 84 92 95 100  Resp: 14 11 18 16   Temp: 98.4 F (36.9 C) 98.4 F (36.9 C) 98 F (36.7 C) 98.8 F (37.1 C)  TempSrc: Oral Oral Oral Oral  SpO2: 100% 98% 97% 93%  Weight:      Height:        Intake/Output Summary (Last 24 hours) at 06/03/2021 1605 Last data filed at 06/03/2021 1300 Gross per 24 hour  Intake  496.59 ml  Output --  Net 496.59 ml   Filed Weights   06/01/21 0130 06/02/21 0500  Weight: 119.4 kg 120.6 kg    Exam:   General: Appearance:    Obese male in no acute distress     Lungs:     On [place], diminished due to poor effort  Heart:    Tachycardic.   MS:   All extremities are intact.    Neurologic:   Awake, alert       Data Reviewed:   I have personally reviewed following labs and imaging studies:  Labs: Labs show the following:   Basic Metabolic  Panel: Recent Labs  Lab 05/31/21 1700 05/31/21 1717 06/01/21 0232 06/02/21 0214 06/03/21 0358  NA 134* 132*   133* 131* 132* 127*  K 3.9 3.8   3.8 4.5 5.3* 5.5*  CL 91* 91* 90* 89* 87*  CO2 27  --  23 21* 17*  GLUCOSE 93 88 211* 110* 184*  BUN 49* 52* 60* 103* 142*  CREATININE 5.09* 5.10* 6.01* 7.53* 9.07*  CALCIUM 7.8*  --  7.7* 7.8* 7.1*  MG  --   --  2.4 2.7* 2.8*  PHOS  --   --  8.5* >30.0* >30.0*   GFR Estimated Creatinine Clearance: 10.7 mL/min (A) (by C-G formula based on SCr of 9.07 mg/dL (H)). Liver Function Tests: Recent Labs  Lab 05/31/21 1700 06/01/21 0232 06/02/21 0214 06/03/21 0358  AST 463* 468* 1,365* 601*  ALT 229* 245* 618* 494*  ALKPHOS 112 87 119 111  BILITOT 0.4 0.7 0.8 1.2  PROT 7.7 6.8 6.8 6.5  ALBUMIN 3.0* 2.7* 2.6* 2.6*   No results for input(s): LIPASE, AMYLASE in the last 168 hours. No results for input(s): AMMONIA in the last 168 hours. Coagulation profile No results for input(s): INR, PROTIME in the last 168 hours.  CBC: Recent Labs  Lab 05/31/21 1700 05/31/21 1717 06/01/21 0232 06/02/21 0214 06/03/21 0358  WBC 12.1*  --  6.2 10.6* 14.9*  NEUTROABS 10.1*  --   --   --   --   HGB 8.7* 9.9*   9.9* 7.2* 7.2* 7.3*  HCT 27.2* 29.0*   29.0* 22.9* 23.7* 23.3*  MCV 91.0  --  92.3 92.6 91.4  PLT 269  --  217 258 276   Cardiac Enzymes: No results for input(s): CKTOTAL, CKMB, CKMBINDEX, TROPONINI in the last 168 hours. BNP (last 3 results) No results for input(s): PROBNP in the last 8760 hours. CBG: Recent Labs  Lab 06/02/21 1607 06/02/21 2031 06/02/21 2342 06/03/21 0606 06/03/21 1201  GLUCAP 256* 182* 164* 194* 260*   D-Dimer: Recent Labs    06/02/21 0214 06/03/21 0358  DDIMER 1.63* 1.33*   Hgb A1c: Recent Labs    06/01/21 0232  HGBA1C 6.1*   Lipid Profile: No results for input(s): CHOL, HDL, LDLCALC, TRIG, CHOLHDL, LDLDIRECT in the last 72 hours. Thyroid function studies: No results for input(s): TSH, T4TOTAL,  T3FREE, THYROIDAB in the last 72 hours.  Invalid input(s): FREET3 Anemia work up: Recent Labs    06/02/21 0214 06/03/21 0358  FERRITIN >7,500* 3,920*   Sepsis Labs: Recent Labs  Lab 05/31/21 1700 06/01/21 0232 06/02/21 0214 06/03/21 0358  WBC 12.1* 6.2 10.6* 14.9*    Microbiology Recent Results (from the past 240 hour(s))  Resp Panel by RT-PCR (Flu A&B, Covid) Nasopharyngeal Swab     Status: Abnormal   Collection Time: 05/31/21  4:48 PM   Specimen: Nasopharyngeal Swab; Nasopharyngeal(NP)  swabs in vial transport medium  Result Value Ref Range Status   SARS Coronavirus 2 by RT PCR POSITIVE (A) NEGATIVE Final    Comment: (NOTE) SARS-CoV-2 target nucleic acids are DETECTED.  The SARS-CoV-2 RNA is generally detectable in upper respiratory specimens during the acute phase of infection. Positive results are indicative of the presence of the identified virus, but do not rule out bacterial infection or co-infection with other pathogens not detected by the test. Clinical correlation with patient history and other diagnostic information is necessary to determine patient infection status. The expected result is Negative.  Fact Sheet for Patients: EntrepreneurPulse.com.au  Fact Sheet for Healthcare Providers: IncredibleEmployment.be  This test is not yet approved or cleared by the [place] FDA and  has been authorized for detection and/or diagnosis of SARS-CoV-2 by FDA under an Emergency Use Authorization (EUA).  This EUA will remain in effect (meaning this test can be used) for the duration of  the COVID-19 declaration under Section 564(b)(1) of the A ct, 21 U.S.C. section 360bbb-3(b)(1), unless the authorization is terminated or revoked sooner.     Influenza A by PCR NEGATIVE NEGATIVE Final   Influenza B by PCR NEGATIVE NEGATIVE Final    Comment: (NOTE) The Xpert Xpress SARS-CoV-2/FLU/RSV plus assay is intended as an aid in the  diagnosis of influenza from Nasopharyngeal swab specimens and should not be used as a sole basis for treatment. Nasal washings and aspirates are unacceptable for Xpert Xpress SARS-CoV-2/FLU/RSV testing.  Fact Sheet for Patients: EntrepreneurPulse.com.au  Fact Sheet for Healthcare Providers: IncredibleEmployment.be  This test is not yet approved or cleared by the [place] FDA and has been authorized for detection and/or diagnosis of SARS-CoV-2 by FDA under an Emergency Use Authorization (EUA). This EUA will remain in effect (meaning this test can be used) for the duration of the COVID-19 declaration under Section 564(b)(1) of the Act, 21 U.S.C. section 360bbb-3(b)(1), unless the authorization is terminated or revoked.  Performed at [facility], [place] [address] [place], [place] [address]   MRSA Next Gen by PCR, Nasal     Status: None   Collection Time: 06/01/21  1:23 AM   Specimen: Nasal Mucosa; Nasal Swab  Result Value Ref Range Status   MRSA by PCR Next Gen NOT DETECTED NOT DETECTED Final    Comment: (NOTE) The GeneXpert MRSA Assay (FDA approved for NASAL specimens only), is one component of a comprehensive MRSA colonization surveillance program. It is not intended to diagnose MRSA infection nor to guide or monitor treatment for MRSA infections. Test performance is not FDA approved in patients less than 57 years old. Performed at [facility], [place] [address] [place], [place] [address]     Procedures and diagnostic studies:  No results found.  Medications:    [START ON 06/16/2021] amiodarone  200 mg Oral Daily   amiodarone  200 mg Oral BID   apixaban  5 mg Oral BID   chlorhexidine  15 mL Mouth Rinse BID   Chlorhexidine Gluconate Cloth  6 each Topical Q0600   Chlorhexidine Gluconate Cloth  6 each Topical Q0600   darbepoetin (ARANESP) injection - DIALYSIS  200 mcg Intravenous Q Mon-HD   dexamethasone (DECADRON)  injection  6 mg Intravenous Q24H   enzalutamide  160 mg Oral QHS   ferric citrate  840 mg Oral TID WC   furosemide  80 mg Intravenous BID   insulin aspart  0-5 Units Subcutaneous QHS   insulin aspart  0-9  Units Subcutaneous TID WC   insulin glargine-yfgn  10 Units Subcutaneous QHS   levothyroxine  100 mcg Oral QAC breakfast   linaclotide  72 mcg Oral QAC breakfast   mouth rinse  15 mL Mouth Rinse q12n4p   metoprolol tartrate  25 mg Oral BID   sertraline  100 mg Oral Daily   sodium chloride flush  3 mL Intravenous Q12H   tamsulosin  0.4 mg Oral Daily   Continuous Infusions:     LOS: 2 days   Geradine Girt  [facility]   How to contact the [facility] Attending or Consulting provider [place] or covering provider during after hours [place], for this patient?  Check the care team in [facility] and look for a) attending/consulting TRH provider listed and b) the [facility] team listed Log into [email or web address] and use [place]'s universal password to access. If you do not have the password, please contact the hospital operator. Locate the [facility] provider you are looking for under [facility] and page to a number that you can be directly reached. If you still have difficulty reaching the provider, please page the [facility] (Director on Call) for the Hospitalists listed on amion for assistance.  06/03/2021, 4:05 PM

## 2021-06-03 NOTE — TOC Progression Note (Signed)
Transition of Care [facility]) - Progression Note    Patient Details  Name: Richard Hall MRN: [id number] Date of Birth: Jul 23, 1955  Transition of Care [facility]) CM/SW [place], RN Phone Number:[phone]  06/03/2021, 11:55 AM  Clinical Narrative:    Patient currently on airborne isolation. CM has attempted to call patients room for assessment with no answer. CM will continue to attempt to contact the patient.         Expected Discharge Plan and Services                                                 Social Determinants of Health (SDOH) Interventions    Readmission Risk Interventions Readmission Risk Prevention Plan 01/12/2019  Transportation Screening Complete  PCP or Specialist Appt within 3-5 Days Complete  HRI or [place] Complete  Social Work Consult for [place] Planning/Counseling Complete  Palliative Care Screening Not Applicable  Medication Review Press photographer) Complete  Some recent data might be hidden

## 2021-06-03 NOTE — Progress Notes (Signed)
[facility] Progress Note  Name: Richard Hall MRN: [id number] DOB: [birth year]  Chief Complaint:  Shortness of breath   Subjective:  last HD outpatient.  Strict ins/outs are not available.  He states he hasn't cramped with HD in a while.  He wears 2 liters oxygen at home and has been on that here.   Review of systems:  States breathing is ok Denies chest pain  Denies n/v   Intake/Output Summary (Last 24 hours) at 06/03/2021 0932 Last data filed at 06/03/2021 0841 Gross per 24 hour  Intake 256.59 ml  Output --  Net 256.59 ml    Vitals:  Vitals:   06/02/21 2129 06/02/21 2345 06/03/21 0414 06/03/21 0803  BP: 96/64 110/76 113/77 107/65  Pulse: 84 92 95 100  Resp: 14 11 18 16   Temp: 98.4 F (36.9 C) 98.4 F (36.9 C) 98 F (36.7 C) 98.8 F (37.1 C)  TempSrc: Oral Oral Oral Oral  SpO2: 100% 98% 97% 93%  Weight:      Height:         Physical Exam:  General adult male in bed in no acute distress HEENT normocephalic atraumatic extraocular movements intact sclera anicteric Neck supple trachea midline Lungs clear but reduced on auscultation; unlabored at rest on 2 liters oxygen Heart S1S2 no rub Abdomen soft nontender nondistended/obese habitus Extremities no pitting edema  Psych normal mood and affect Neuro alert and oriented; follows commands and provides hx  Access LUE AVF bruit and thrill   Medications reviewed   Labs:  BMP Latest Ref Rng & Units 06/03/2021 06/02/2021 06/01/2021  Glucose 70 - 99 mg/dL 184(H) 110(H) 211(H)  BUN 8 - 23 mg/dL 142(H) 103(H) 60(H)  Creatinine 0.61 - 1.24 mg/dL 9.07(H) 7.53(H) 6.01(H)  Sodium 135 - 145 mmol/L 127(L) 132(L) 131(L)  Potassium 3.5 - 5.1 mmol/L 5.5(H) 5.3(H) 4.5  Chloride 98 - 111 mmol/L 87(L) 89(L) 90(L)  CO2 22 - 32 mmol/L 17(L) 21(L) 23  Calcium 8.9 - 10.3 mg/dL 7.1(L) 7.8(L) 7.7(L)   Outpatient dialysis orders:  [facility] MWF Outpt prescription: F180, BFR 450, 4hrs, 2 K, 2 Ca, EDW 123kg AVF Meds -  mircera 225 mcg every 2 weeks - last received 200 mcg on 05/15/21 and was ordered to increase venofer 100 mg (ordered for 10 doses) Act vit D not ordered  Assessment/Plan:   # ESRD:  Left below his EDW at 121 last outpatient tx.  It was felt most of his hypoxia was related to covid.  Expect weight loss with covid though - HD today per MWF schedule  - challenge his EDW as tolerated - note he is on BID lasix here - listed as daily on non-HD days per home med list. Agree with continuing BID while here - re-ordered strict ins/outs  - on renal diet   # Volume/ hypertension: EDW 123kg. But left below EDW at 121 last outpatient tx on 12/30.  Will challenge EDW here.    # Anemia of Chronic Kidney Disease: Ferritin 5300 so cant use iron. Resume ESA - ordered aranesp 200 mcg every Monday to start 1/2   # Secondary Hyperparathyroidism/Hyperphosphatemia: hyperphos. On [place] and for HD. Increased to 2.5 calcium bath here   #Acute hypoxic respiratory failure secondary to COVID-pneumonia: Oxygen status has improved.  Treatment per primary team. On dexamethasone   #Atrial Fibrillation w/ RVR: Management per primary team. On amio   #Hyperkalemia: for HD today   Disposition - per primary team; HD here today  Claudia Desanctis, MD 06/03/2021 9:32 AM

## 2021-06-03 NOTE — Progress Notes (Signed)
Cardiology Progress Note  Patient ID: Jaydeen Odor MRN: [id number] DOB: 1955/10/14 Date of Encounter: 06/03/2021  Primary Cardiologist: None  Subjective   Chief Complaint: None.   HPI: Still SOB. In Afib with controlled rates.   ROS:  All other ROS reviewed and negative. Pertinent positives noted in the HPI.     Inpatient Medications  Scheduled Meds:  [START ON 06/16/2021] amiodarone  200 mg Oral Daily   amiodarone  200 mg Oral BID   apixaban  5 mg Oral BID   chlorhexidine  15 mL Mouth Rinse BID   Chlorhexidine Gluconate Cloth  6 each Topical Q0600   Chlorhexidine Gluconate Cloth  6 each Topical Q0600   dexamethasone (DECADRON) injection  6 mg Intravenous Q24H   enzalutamide  160 mg Oral QHS   ferric citrate  840 mg Oral TID WC   furosemide  80 mg Intravenous BID   insulin aspart  0-5 Units Subcutaneous QHS   insulin aspart  0-9 Units Subcutaneous TID WC   insulin glargine-yfgn  10 Units Subcutaneous QHS   levothyroxine  100 mcg Oral QAC breakfast   linaclotide  72 mcg Oral QAC breakfast   mouth rinse  15 mL Mouth Rinse q12n4p   metoprolol tartrate  25 mg Oral BID   sertraline  100 mg Oral Daily   sodium chloride flush  3 mL Intravenous Q12H   tamsulosin  0.4 mg Oral Daily   Continuous Infusions:  PRN Meds: acetaminophen **OR** acetaminophen, polyethylene glycol   Vital Signs   Vitals:   06/02/21 2129 06/02/21 2345 06/03/21 0414 06/03/21 0803  BP: 96/64 110/76 113/77 107/65  Pulse: 84 92 95 100  Resp: 14 11 18 16   Temp: 98.4 F (36.9 C) 98.4 F (36.9 C) 98 F (36.7 C) 98.8 F (37.1 C)  TempSrc: Oral Oral Oral Oral  SpO2: 100% 98% 97% 93%  Weight:      Height:        Intake/Output Summary (Last 24 hours) at 06/03/2021 0909 Last data filed at 06/03/2021 0841 Gross per 24 hour  Intake 256.59 ml  Output --  Net 256.59 ml   Last 3 Weights 06/02/2021 06/01/2021 07/26/2020  Weight (lbs) 265 lb 14 oz 263 lb 3.7 oz 276 lb 3.2 oz  Weight (kg) 120.6 kg 119.4 kg  125.283 kg      Telemetry  Overnight telemetry shows Afib 90s, which I personally reviewed.   Physical Exam   Vitals:   06/02/21 2129 06/02/21 2345 06/03/21 0414 06/03/21 0803  BP: 96/64 110/76 113/77 107/65  Pulse: 84 92 95 100  Resp: 14 11 18 16   Temp: 98.4 F (36.9 C) 98.4 F (36.9 C) 98 F (36.7 C) 98.8 F (37.1 C)  TempSrc: Oral Oral Oral Oral  SpO2: 100% 98% 97% 93%  Weight:      Height:        Intake/Output Summary (Last 24 hours) at 06/03/2021 0909 Last data filed at 06/03/2021 0841 Gross per 24 hour  Intake 256.59 ml  Output --  Net 256.59 ml    Last 3 Weights 06/02/2021 06/01/2021 07/26/2020  Weight (lbs) 265 lb 14 oz 263 lb 3.7 oz 276 lb 3.2 oz  Weight (kg) 120.6 kg 119.4 kg 125.283 kg    Body mass index is 36.06 kg/m.   General: Well nourished, well developed, in no acute distress Head: Atraumatic, normal size  Eyes: PEERLA, EOMI  Neck: Supple, no JVD Endocrine: No thryomegaly Cardiac: Normal S1, S2; irregular rhythm, 2/6  HSM Lungs: Clear to auscultation bilaterally, no wheezing, rhonchi or rales  Abd: Soft, nontender, no hepatomegaly  Ext: No edema, pulses 2+ Musculoskeletal: No deformities, BUE and BLE strength normal and equal Skin: Warm and dry, no rashes   Neuro: Alert and oriented to person, place, time, and situation, CNII-XII grossly intact, no focal deficits  Psych: Normal mood and affect   Labs  High Sensitivity Troponin:   Recent Labs  Lab 05/31/21 1700 05/31/21 1851  TROPONINIHS 707* 660*     Cardiac EnzymesNo results for input(s): TROPONINI in the last 168 hours. No results for input(s): TROPIPOC in the last 168 hours.  Chemistry Recent Labs  Lab 06/01/21 0232 06/02/21 0214 06/03/21 0358  NA 131* 132* 127*  K 4.5 5.3* 5.5*  CL 90* 89* 87*  CO2 23 21* 17*  GLUCOSE 211* 110* 184*  BUN 60* 103* 142*  CREATININE 6.01* 7.53* 9.07*  CALCIUM 7.7* 7.8* 7.1*  PROT 6.8 6.8 6.5  ALBUMIN 2.7* 2.6* 2.6*  AST 468* 1,365* 601*  ALT 245*  618* 494*  ALKPHOS 87 119 111  BILITOT 0.7 0.8 1.2  GFRNONAA 10* 7* 6*  ANIONGAP 18* 22* 23*    Hematology Recent Labs  Lab 06/01/21 0232 06/02/21 0214 06/03/21 0358  WBC 6.2 10.6* 14.9*  RBC 2.48* 2.56* 2.55*  HGB 7.2* 7.2* 7.3*  HCT 22.9* 23.7* 23.3*  MCV 92.3 92.6 91.4  MCH 29.0 28.1 28.6  MCHC 31.4 30.4 31.3  RDW 18.1* 18.0* 17.6*  PLT 217 258 276   BNP Recent Labs  Lab 05/31/21 1701  BNP 1,212.6*    DDimer  Recent Labs  Lab 06/01/21 0232 06/02/21 0214 06/03/21 0358  DDIMER 1.89* 1.63* 1.33*     Radiology  No results found.  Cardiac Studies  TTE 06/01/2021  1. Left ventricular ejection fraction, by estimation, is 60 to 65%. The  left ventricle has normal function. The left ventricle has no regional  wall motion abnormalities. There is mild left ventricular hypertrophy.  Left ventricular diastolic parameters  were normal.   2. Right ventricular systolic function is normal. The right ventricular  size is normal. There is moderately elevated pulmonary artery systolic  pressure.   3. Left atrial size was moderately dilated.   4. There is significant annular calcification and  calcification/thickening of mitrla leaflets PISA radius 1.1 with PISA ERO  55 mm 2 suggest severe MR Consider TEE if clinically indicated when acute  COVID respiratory precautions done . The mitral valve  is abnormal. Severe mitral valve regurgitation. No evidence of mitral  stenosis. Moderate mitral annular calcification.   5. The aortic valve is tricuspid. There is moderate calcification of the  aortic valve. There is moderate thickening of the aortic valve. Aortic  valve regurgitation is mild. Aortic valve sclerosis/calcification is  present, without any evidence of aortic  stenosis.   6. Aortic dilatation noted. There is moderate dilatation of the ascending  aorta, measuring 43 mm.   7. The inferior vena cava is normal in size with greater than 50%  respiratory variability,  suggesting right atrial pressure of 3 mmHg.   Patient Profile  Richard Hall is a 66 y.o. male with ESRD on hemodialysis, paroxysmal atrial fibrillation status post watchman placement, diastolic heart failure, GI bleed who was admitted on 05/31/2021 with acute hypoxic respiratory failure secondary to diastolic heart failure as well as COVID-19 infection.  He was found to be in atrial fibrillation with RVR.  Assessment & Plan   #Persistent  A. Fib -Admitted with diastolic heart failure as well as COVID-19 infection.  Found to be in A. fib. -Status post watchman placement. -We will continue Eliquis 5 mg twice daily.  Needs to complete this for 45 days post watchman procedure. -Plan is for amiodarone load and possible outpatient cardioversion.  We will also need TEE for MR. -Plan is for amiodarone 200 mg twice daily for 14 days and then 200 mg daily for 14 days.  Hopefully he will convert back to sinus rhythm. -Would also continue metoprolol tartrate 25 mg twice daily. -He has a cardiologist in [place].  He can follow there.  #Severe mitral regurgitation -Suspect this is related to degenerative mitral valve disease.  He has significant mitral annular calcium. -This could have been a trigger for A. fib. -He is status post watchman procedure.  I suspect his cardiologist is following his MR. -Would recommend outpatient TEE once he has recovered from COVID-19 infection.  #Hypoxic respiratory failure #Acute diastolic heart failure #ZYSAY-30 infection -Volume status maintained by dialysis.  Being treated for COVID-19. -Overall condition is improving.  [facility] will sign off.   Medication Recommendations: We will continue above medications for rate control.  Outpatient evaluation of his A. fib by his primary cardiologist in [place].  Also will need to be seen for transesophageal echo of his mitral valve.  This cannot be done as an outpatient once he is recovered from COVID-19  infection. Other recommendations (labs, testing, etc): None. Follow up as an outpatient: He may follow-up with his primary cardiologist in [place].  For questions or updates, please contact [place] Please consult [email or web address] for contact info under   Time Spent with Patient: I have spent a total of 25 minutes with patient reviewing hospital notes, telemetry, EKGs, labs and examining the patient as well as establishing an assessment and plan that was discussed with the patient.  > 50% of time was spent in direct patient care.    Signed, Addison Naegeli. Audie Box, MD, [place]  06/03/2021 9:09 AM

## 2021-06-04 ENCOUNTER — Inpatient Hospital Stay (HOSPITAL_COMMUNITY): Payer: Medicare Other

## 2021-06-04 DIAGNOSIS — R778 Other specified abnormalities of plasma proteins: Secondary | ICD-10-CM | POA: Diagnosis not present

## 2021-06-04 DIAGNOSIS — J440 Chronic obstructive pulmonary disease with acute lower respiratory infection: Secondary | ICD-10-CM | POA: Diagnosis not present

## 2021-06-04 DIAGNOSIS — N186 End stage renal disease: Secondary | ICD-10-CM | POA: Diagnosis not present

## 2021-06-04 DIAGNOSIS — I4891 Unspecified atrial fibrillation: Secondary | ICD-10-CM | POA: Diagnosis not present

## 2021-06-04 DIAGNOSIS — E039 Hypothyroidism, unspecified: Secondary | ICD-10-CM

## 2021-06-04 DIAGNOSIS — Z992 Dependence on renal dialysis: Secondary | ICD-10-CM

## 2021-06-04 DIAGNOSIS — G4733 Obstructive sleep apnea (adult) (pediatric): Secondary | ICD-10-CM

## 2021-06-04 DIAGNOSIS — Z9989 Dependence on other enabling machines and devices: Secondary | ICD-10-CM

## 2021-06-04 LAB — CBC
HCT: 23.1 % — ABNORMAL LOW (ref 39.0–52.0)
Hemoglobin: 7.5 g/dL — ABNORMAL LOW (ref 13.0–17.0)
MCH: 28.7 pg (ref 26.0–34.0)
MCHC: 32.5 g/dL (ref 30.0–36.0)
MCV: 88.5 fL (ref 80.0–100.0)
Platelets: 312 10*3/uL (ref 150–400)
RBC: 2.61 MIL/uL — ABNORMAL LOW (ref 4.22–5.81)
RDW: 17.8 % — ABNORMAL HIGH (ref 11.5–15.5)
WBC: 17.9 10*3/uL — ABNORMAL HIGH (ref 4.0–10.5)
nRBC: 0.7 % — ABNORMAL HIGH (ref 0.0–0.2)

## 2021-06-04 LAB — COMPREHENSIVE METABOLIC PANEL
ALT: 466 U/L — ABNORMAL HIGH (ref 0–44)
ALT: 372 U/L — ABNORMAL HIGH (ref 0–44)
AST: 464 U/L — ABNORMAL HIGH (ref 15–41)
AST: 345 U/L — ABNORMAL HIGH (ref 15–41)
Albumin: 2.5 g/dL — ABNORMAL LOW (ref 3.5–5.0)
Albumin: 2.2 g/dL — ABNORMAL LOW (ref 3.5–5.0)
Alkaline Phosphatase: 113 U/L (ref 38–126)
Alkaline Phosphatase: 104 U/L (ref 38–126)
Anion gap: 16 — ABNORMAL HIGH (ref 5–15)
Anion gap: 15 (ref 5–15)
BUN: 60 mg/dL — ABNORMAL HIGH (ref 8–23)
BUN: 62 mg/dL — ABNORMAL HIGH (ref 8–23)
CO2: 24 mmol/L (ref 22–32)
CO2: 24 mmol/L (ref 22–32)
Calcium: 7.4 mg/dL — ABNORMAL LOW (ref 8.9–10.3)
Calcium: 7.4 mg/dL — ABNORMAL LOW (ref 8.9–10.3)
Chloride: 93 mmol/L — ABNORMAL LOW (ref 98–111)
Chloride: 93 mmol/L — ABNORMAL LOW (ref 98–111)
Creatinine, Ser: 4.4 mg/dL — ABNORMAL HIGH (ref 0.61–1.24)
Creatinine, Ser: 5.83 mg/dL — ABNORMAL HIGH (ref 0.61–1.24)
GFR, Estimated: 14 mL/min — ABNORMAL LOW (ref >60)
GFR, Estimated: 10 mL/min — ABNORMAL LOW (ref >60)
Glucose, Bld: 100 mg/dL — ABNORMAL HIGH (ref 70–99)
Glucose, Bld: 129 mg/dL — ABNORMAL HIGH (ref 70–99)
Potassium: 3 mmol/L — ABNORMAL LOW (ref 3.5–5.1)
Potassium: 4.4 mmol/L (ref 3.5–5.1)
Sodium: 133 mmol/L — ABNORMAL LOW (ref 135–145)
Sodium: 132 mmol/L — ABNORMAL LOW (ref 135–145)
Total Bilirubin: 0.7 mg/dL (ref 0.3–1.2)
Total Bilirubin: 0.7 mg/dL (ref 0.3–1.2)
Total Protein: 6.7 g/dL (ref 6.5–8.1)
Total Protein: 6.5 g/dL (ref 6.5–8.1)

## 2021-06-04 LAB — FERRITIN: Ferritin: 2878 ng/mL — ABNORMAL HIGH (ref 24–336)

## 2021-06-04 LAB — TSH: TSH: 3.011 u[IU]/mL (ref 0.350–4.500)

## 2021-06-04 LAB — BLOOD GAS, ARTERIAL
Acid-Base Excess: 0.8 mmol/L (ref 0.0–2.0)
Bicarbonate: 25.8 mmol/L (ref 20.0–28.0)
FIO2: 28
O2 Saturation: 91.6 %
Patient temperature: 37
pCO2 arterial: 49.1 mmHg — ABNORMAL HIGH (ref 32.0–48.0)
pH, Arterial: 7.341 — ABNORMAL LOW (ref 7.350–7.450)
pO2, Arterial: 74.7 mmHg — ABNORMAL LOW (ref 83.0–108.0)

## 2021-06-04 LAB — GLUCOSE, CAPILLARY
Glucose-Capillary: 111 mg/dL — ABNORMAL HIGH (ref 70–99)
Glucose-Capillary: 97 mg/dL (ref 70–99)
Glucose-Capillary: 127 mg/dL — ABNORMAL HIGH (ref 70–99)
Glucose-Capillary: 140 mg/dL — ABNORMAL HIGH (ref 70–99)

## 2021-06-04 LAB — PHOSPHORUS: Phosphorus: 4.8 mg/dL — ABNORMAL HIGH (ref 2.5–4.6)

## 2021-06-04 LAB — C-REACTIVE PROTEIN: CRP: 14.9 mg/dL — ABNORMAL HIGH (ref <1.0)

## 2021-06-04 LAB — TROPONIN I (HIGH SENSITIVITY): Troponin I (High Sensitivity): 132 ng/L (ref <18)

## 2021-06-04 LAB — AMMONIA: Ammonia: 30 umol/L (ref 9–35)

## 2021-06-04 LAB — D-DIMER, QUANTITATIVE: D-Dimer, Quant: 1.66 ug/mL-FEU — ABNORMAL HIGH (ref 0.00–0.50)

## 2021-06-04 LAB — MAGNESIUM: Magnesium: 2.1 mg/dL (ref 1.7–2.4)

## 2021-06-04 MED ORDER — INSULIN ASPART 100 UNIT/ML IJ SOLN
0.0000 [IU] | INTRAMUSCULAR | Status: DC
  Administered 2021-06-05: 1 [IU] via SUBCUTANEOUS
  Administered 2021-06-05: 3 [IU] via SUBCUTANEOUS
  Administered 2021-06-05 (×4): 1 [IU] via SUBCUTANEOUS
  Administered 2021-06-06: 2 [IU] via SUBCUTANEOUS
  Administered 2021-06-06: 3 [IU] via SUBCUTANEOUS
  Administered 2021-06-06 (×4): 4 [IU] via SUBCUTANEOUS
  Administered 2021-06-07: 3 [IU] via SUBCUTANEOUS
  Administered 2021-06-07 (×2): 4 [IU] via SUBCUTANEOUS

## 2021-06-04 MED ORDER — DARBEPOETIN ALFA 200 MCG/0.4ML IJ SOSY
200.0000 ug | PREFILLED_SYRINGE | INTRAMUSCULAR | Status: DC
  Filled 2021-06-04: qty 0.4

## 2021-06-04 MED ORDER — AMIODARONE HCL IN DEXTROSE 360-4.14 MG/200ML-% IV SOLN
30.0000 mg/h | INTRAVENOUS | Status: DC
  Administered 2021-06-05 (×3): 60 mg/h via INTRAVENOUS
  Administered 2021-06-05: 30 mg/h via INTRAVENOUS
  Administered 2021-06-06: 60 mg/h via INTRAVENOUS
  Filled 2021-06-04 (×6): qty 200

## 2021-06-04 MED ORDER — METOPROLOL TARTRATE 5 MG/5ML IV SOLN
2.5000 mg | Freq: Four times a day (QID) | INTRAVENOUS | Status: DC | PRN
  Administered 2021-06-05: 5 mg via INTRAVENOUS
  Filled 2021-06-04 (×2): qty 5

## 2021-06-04 MED ORDER — AMIODARONE HCL IN DEXTROSE 360-4.14 MG/200ML-% IV SOLN: 30.0000 mg/h | INTRAVENOUS | Status: DC

## 2021-06-04 MED ORDER — AMIODARONE LOAD VIA INFUSION
200.0000 mg | Freq: Two times a day (BID) | INTRAVENOUS | Status: DC
Start: 2021-06-05 — End: 2021-06-04

## 2021-06-04 MED ORDER — LEVOTHYROXINE SODIUM 100 MCG/5ML IV SOLN
50.0000 ug | Freq: Every day | INTRAVENOUS | Status: DC
  Filled 2021-06-04: qty 5

## 2021-06-04 MED ORDER — DEXAMETHASONE SODIUM PHOSPHATE 10 MG/ML IJ SOLN
4.0000 mg | INTRAMUSCULAR | Status: DC
  Administered 2021-06-04 – 2021-06-05 (×2): 4 mg via INTRAVENOUS
  Filled 2021-06-04: qty 1
  Filled 2021-06-04: qty 0.4

## 2021-06-04 NOTE — Progress Notes (Signed)
Inpatient Diabetes Program Recommendations  AACE/ADA: New Consensus Statement on Inpatient Glycemic Control (2015)  Target Ranges:  Prepandial:   less than 140 mg/dL      Peak postprandial:   less than 180 mg/dL (1-2 hours)      Critically ill patients:  140 - 180 mg/dL   Lab Results  Component Value Date   GLUCAP 153 (H) 06/03/2021   HGBA1C 6.1 (H) 06/01/2021    Review of Glycemic Control  Latest Reference Range & Units 06/02/21 16:07 06/02/21 20:31 06/02/21 23:42 06/03/21 06:06 06/03/21 12:01 06/03/21 16:39 06/03/21 20:35  Glucose-Capillary 70 - 99 mg/dL 256 (H) 182 (H) 164 (H) 194 (H) 260 (H) 334 (H) 153 (H)   Diabetes history: DM 2/ ESRD Outpatient Diabetes medications: Toujeo 10 units daily (patient reports using 30 units of Toujeo daily) Current orders for Inpatient glycemic control:  Novolog sensitive tid with meals and HS Semglee 20 units q HS Prednisone 40 mg daily Inpatient Diabetes Program Recommendations:     Note Semglee increased last night.  May need low dose meal coverage if Prednisone increases post-prandial blood sugars.  Will follow.   Thanks,  Adah Perl, RN, BC-ADM Inpatient Diabetes Coordinator Pager [phone]  (8a-5p)

## 2021-06-04 NOTE — Consult Note (Signed)
NAME:  Richard Hall, MRN:  [id number], DOB:  [birth year], LOS: 3 ADMISSION DATE:  05/31/2021, CONSULTATION DATE: 06/04/2021 REFERRING MD: [facility], CHIEF COMPLAINT: Altered mental status  History of Present Illness:  66 year old male with a plethora of health issues was admitted on 05/31/2021 shortness of breath was found to be COVID-19 positive.  He is end-stage renal disease underwent dialysis on 06/04/2020 and was a -2 L.  He had decreased level of consciousness on 06/04/2021 and there was concern for hypercarbia but his PCO2 was 46 on ABGs therefore another source of confusion and altered mental status is being evaluated.  He is requiring noninvasive mechanical ventilatory support at this time.  He is going to CT scan for further evaluation of altered mental status to be transferred to intensive care unit first to be stabilized and then go to CT scan.  He has an extensive past medical history is well-documented below.  Pertinent  Medical History   Past Medical History:  Diagnosis Date   A-V fistula (HCC)    left arm   Anemia    Arthritis    CHF (congestive heart failure) (HCC)    CVA (cerebral vascular accident) ([place]) 2010   Diabetes mellitus with end-stage renal disease ([place])    Dyalisis pt.   Diabetic neuropathy (HCC)    Bilateral feet   Dyslipidemia    Dysrhythmia    A-fib   Essential hypertension    Hepatitis    childhood   History of blood transfusion    Hypothyroidism (acquired)    OSA on CPAP    Prostate cancer [facility])    Uses wheelchair      Significant Hospital Events: Including procedures, antibiotic start and stop dates in addition to other pertinent events   06/04/2021 transferred intensive care unit for altered mental status  Interim History / Subjective:  66 year old male with a plethora of health issues you had altered mental status in the setting of COVID-19 there was a concern for possible hypercarbia but this was not justified the case as PCO2 was in the 40s.   Transfer intensive care for further evaluation and treatment  Objective   Blood pressure 103/62, pulse 92, temperature 98.6 F (37 C), temperature source Oral, resp. rate (!) 22, height 6' (1.829 m), weight 118 kg, SpO2 93 %.        Intake/Output Summary (Last 24 hours) at 06/04/2021 1051 Last data filed at 06/04/2021 3734 Gross per 24 hour  Intake 240 ml  Output 3000 ml  Net -2760 ml   Filed Weights   06/02/21 0500 06/04/21 0306 06/04/21 0806  Weight: 120.6 kg 121.5 kg 118 kg    Examination: General: Obese male who is poorly response HENT: No JVD or lymphadenopathy is appreciated Lungs: Diminished breath sounds throughout currently on noninvasive mechanical ventilatory support 30% FiO2 with a spontaneous tidal volume of 545 cc Cardiovascular: Atrial fibrillation controlled ventricular rate of 86 murmurs present Abdomen: Diffusely tender positive bowel sounds Extremities: 1+ edema Neuro: Opens eyes to noxious stimuli tracks but will not follow commands GU: No Foley in place  Resolved Hospital Problem list     Assessment & Plan:  Altered mental status in the setting of COVID-19 infection, stage renal disease status postdialysis Tuesdays Thursdays and Saturdays, history of atrial fibrillation currently on Eliquis.  Noted to have elevated liver enzymes. Transfer intensive care unit Once established intensive care unit CT of the head to to evaluate altered mental status. Hold all sedating medication  COVID-19 Continue  current medications  History of sleep apnea currently on BiPAP at 30% FiO2 with sats at 96% Evaluate for ability to protect airway As needed and nocturnal BiPAP   Atrial fibrillation with rapid ventricular response Continue current interventions with a noted heart rate of 84 Consider changing to systemic heparin from Eliquis due to the fact tropical to be n.p.o. with altered mental status  Liver enzymes elevated Noted to be descending Continue to  monitor  Mitral valve regurgitation Noted on most recent 2D echo Continue to monitor  End-stage renal disease Last dialysis 06/04/2020 with -2 L Per nephrology  Hypothyroidism May need to change Synthroid to IV depending on level of consciousness   Diabetes mellitus CBG (last 3)  Recent Labs    06/03/21 2035 06/04/21 0929 06/04/21 1040  GLUCAP 153* 111* 97  Sliding-scale insulin protocol   Best Practice (right click and "Reselect all SmartList Selections" daily)   Diet/type: NPO DVT prophylaxis: DOAC GI prophylaxis: PPI Lines: N/A Foley:  N/A Code Status:  full code Last date of multidisciplinary goals of care discussion [tbd]  Labs   CBC: Recent Labs  Lab 05/31/21 1700 05/31/21 1717 06/01/21 0232 06/02/21 0214 06/03/21 0358 06/04/21 0620  WBC 12.1*  --  6.2 10.6* 14.9* 17.9*  NEUTROABS 10.1*  --   --   --   --   --   HGB 8.7* 9.9*   9.9* 7.2* 7.2* 7.3* 7.5*  HCT 27.2* 29.0*   29.0* 22.9* 23.7* 23.3* 23.1*  MCV 91.0  --  92.3 92.6 91.4 88.5  PLT 269  --  217 258 276 469    Basic Metabolic Panel: Recent Labs  Lab 05/31/21 1700 05/31/21 1717 06/01/21 0232 06/02/21 0214 06/03/21 0358 06/04/21 0620  NA 134* 132*   133* 131* 132* 127* 133*  K 3.9 3.8   3.8 4.5 5.3* 5.5* 3.0*  CL 91* 91* 90* 89* 87* 93*  CO2 27  --  23 21* 17* 24  GLUCOSE 93 88 211* 110* 184* 100*  BUN 49* 52* 60* 103* 142* 60*  CREATININE 5.09* 5.10* 6.01* 7.53* 9.07* 4.40*  CALCIUM 7.8*  --  7.7* 7.8* 7.1* 7.4*  MG  --   --  2.4 2.7* 2.8* 2.1  PHOS  --   --  8.5* >30.0* >30.0* 4.8*   GFR: Estimated Creatinine Clearance: 21.9 mL/min (A) (by C-G formula based on SCr of 4.4 mg/dL (H)). Recent Labs  Lab 06/01/21 0232 06/02/21 0214 06/03/21 0358 06/04/21 0620  WBC 6.2 10.6* 14.9* 17.9*    Liver Function Tests: Recent Labs  Lab 05/31/21 1700 06/01/21 0232 06/02/21 0214 06/03/21 0358 06/04/21 0620  AST 463* 468* 1,365* 601* 464*  ALT 229* 245* 618* 494* 466*  ALKPHOS  112 87 119 111 113  BILITOT 0.4 0.7 0.8 1.2 0.7  PROT 7.7 6.8 6.8 6.5 6.7  ALBUMIN 3.0* 2.7* 2.6* 2.6* 2.5*   No results for input(s): LIPASE, AMYLASE in the last 168 hours. No results for input(s): AMMONIA in the last 168 hours.  ABG    Component Value Date/Time   PHART 7.341 (L) 06/04/2021 0950   PCO2ART 49.1 (H) 06/04/2021 0950   PO2ART 74.7 (L) 06/04/2021 0950   HCO3 25.8 06/04/2021 0950   TCO2 30 05/31/2021 1717   TCO2 34 (H) 05/31/2021 1717   ACIDBASEDEF 2.0 01/07/2019 1618   O2SAT 91.6 06/04/2021 0950     Coagulation Profile: No results for input(s): INR, PROTIME in the last 168 hours.  Cardiac Enzymes: No  results for input(s): CKTOTAL, CKMB, CKMBINDEX, TROPONINI in the last 168 hours.  HbA1C: Hgb A1c MFr Bld  Date/Time Value Ref Range Status  06/01/2021 02:32 AM 6.1 (H) 4.8 - 5.6 % Final    Comment:    (NOTE) Pre diabetes:          5.7%-6.4%  Diabetes:              >6.4%  Glycemic control for   <7.0% adults with diabetes   07/26/2020 10:45 AM 10.1 (H) 4.8 - 5.6 % Final    Comment:    (NOTE) Pre diabetes:          5.7%-6.4%  Diabetes:              >6.4%  Glycemic control for   <7.0% adults with diabetes     CBG: Recent Labs  Lab 06/03/21 1201 06/03/21 1639 06/03/21 2035 06/04/21 0929 06/04/21 1040  GLUCAP 260* 334* 153* 111* 97    Review of Systems:   NA  Past Medical History:  He,  has a past medical history of A-V fistula ([place]), Anemia, Arthritis, CHF (congestive heart failure) ([place]), CVA (cerebral vascular accident) ([place]) (2010), Diabetes mellitus with end-stage renal disease ([place]), Diabetic neuropathy ([place]), Dyslipidemia, Dysrhythmia, Essential hypertension, Hepatitis, History of blood transfusion, Hypothyroidism (acquired), OSA on CPAP, Prostate cancer ([place]), and Uses wheelchair.   Surgical History:   Past Surgical History:  Procedure Laterality Date   APPENDECTOMY     COLONOSCOPY     FEMUR IM NAIL Left 05/20/2019   Procedure:  Intramedullary (Im) Retrograde Femoral Nailing for impending pathologic fracture;  Surgeon: Altamese Mount Carmel, MD;  Location: [place];  Service: Orthopedics;  Laterality: Left;   ORIF FEMUR FRACTURE Left 05/20/2019   Procedure: OPEN REDUCTION INTERNAL FIXATION (ORIF) DISTAL FEMUR FRACTURE with intercondylar extension;  Surgeon: Altamese [place], MD;  Location: [place];  Service: Orthopedics;  Laterality: Left;   TRANSURETHRAL RESECTION OF BLADDER TUMOR N/A 01/07/2019   Procedure: CYSTOSCOPY/ EVEACUATION CLOT;  Surgeon: Bjorn Loser, MD;  Location: [facility];  Service: Urology;  Laterality: N/A;   TRANSURETHRAL RESECTION OF PROSTATE N/A 07/26/2020   Procedure: TRANSURETHRAL RESECTION OF THE PROSTATE (TURP);  Surgeon: Alexis Frock, MD;  Location: [facility];  Service: Urology;  Laterality: N/A;  35 MINS     Social History:   reports that he quit smoking about 26 years ago. He has never used smokeless tobacco. He reports that he does not drink alcohol and does not use drugs.   Family History:  His family history includes Cancer in his mother.   Allergies Allergies  Allergen Reactions   Gabapentin Swelling     Home Medications  Prior to Admission medications   Medication Sig Start Date End Date Taking? Authorizing Provider  acetaminophen (TYLENOL) 500 MG tablet Take 500-1,000 mg by mouth every 6 (six) hours as needed (for pain.).   Yes [provider]  amitriptyline (ELAVIL) 100 MG tablet Take 1 tablet (100 mg total) by mouth at bedtime. 05/25/19  Yes Christian, Rylee, MD  atorvastatin (LIPITOR) 80 MG tablet Take 80 mg by mouth daily. 03/08/20  Yes [provider]  diphenoxylate-atropine (LOMOTIL) 2.5-0.025 MG tablet Take 1 tablet by mouth 4 (four) times daily as needed for diarrhea or loose stools. 04/23/21  Yes [provider]  ELIQUIS 2.5 MG TABS tablet Take 2.5 mg by mouth 2 (two) times daily. 04/22/21  Yes [provider]  EUTHYROX 88 MCG tablet Take 88 mcg by  mouth daily  before breakfast. 02/06/20  Yes [provider]  furosemide (LASIX) 80 MG tablet Take 1 tablet (80 mg total) by mouth 4 (four) times a week. Mondays, wednesdays, fridays, and sundays Patient taking differently: Take 80 mg by mouth See admin instructions. Mondays, Wednesdays, and Saturdays 05/26/19  Yes Christian, Rylee, MD  insulin glargine, 1 Unit Dial, (TOUJEO SOLOSTAR) 300 UNIT/ML Solostar Pen Inject 24 Units into the skin at bedtime.   Yes [provider]  LEUPROLIDE ACETATE, 6 MONTH, 45 MG injection Inject 45 mg into the skin every 6 (six) months. 05/25/19  Yes Christian, Rylee, MD  levothyroxine (SYNTHROID) 100 MCG tablet Take 100 mcg by mouth daily. 05/21/21  Yes [provider]  lidocaine-prilocaine (EMLA) cream Apply 1 application topically See admin instructions. Apply small amount to access site 1-2 hours before dialysis, cover with occlusive dressing (saran wrap) 05/25/19  Yes Christian, Rylee, MD  metoprolol tartrate (LOPRESSOR) 25 MG tablet Take 25 mg by mouth 2 (two) times daily. 02/06/20  Yes [provider]  sertraline (ZOLOFT) 100 MG tablet Take 100 mg by mouth daily. 02/06/20  Yes [provider]  XTANDI 40 MG capsule Take 160 mg by mouth at bedtime. 03/12/20  Yes [provider]  ferric citrate (AURYXIA) 1 GM 210 MG(Fe) tablet Take 2 tablets (420 mg total) by mouth 3 (three) times daily with meals. Patient not taking: Reported on 06/01/2021 05/25/19   Mitzi Hansen, MD  fluticasone [facility] ALLERGY RELIEF) 50 MCG/ACT nasal spray Place 1 spray into both nostrils as needed. Patient not taking: Reported on 06/01/2021 05/25/19   Mitzi Hansen, MD  glipiZIDE (GLUCOTROL) 10 MG tablet Take 5 mg by mouth daily. Patient not taking: Reported on 06/01/2021 02/06/20   [provider]  LINZESS 72 MCG capsule Take 1 capsule (72 mcg total) by mouth as needed. Patient not taking: Reported on 06/01/2021 05/25/19   Mitzi Hansen, MD  traMADol (ULTRAM) 50 MG tablet Take 1-2 tablets (50-100 mg total) by mouth every 6 (six) hours as needed for moderate pain or severe pain. Post-operatively Patient not taking: Reported on 06/01/2021 07/27/20 07/27/21  Alexis Frock, MD     Critical care time: 1 min    Richardson Landry Karan Ramnauth ACNP Acute Care Nurse Practitioner [place] Please consult Amion 06/04/2021, 10:51 AM

## 2021-06-04 NOTE — Progress Notes (Addendum)
[facility] Progress Note  Name: Richard Hall MRN: [id number] DOB: 1956/01/14  Chief Complaint:  Shortness of breath   Subjective:  last HD overnight 1/3 am with 3 kg UF.  He has been somnolent today per nursing and is moving to 2 heart ICU.  Spoke with primary team.  Per report he slept all night but they are not aware of acute events.  They have gotten an ABG and team placed on bipap  Review of systems:   Unable to obtain given ams   Intake/Output Summary (Last 24 hours) at 06/04/2021 1034 Last data filed at 06/04/2021 0806 Gross per 24 hour  Intake 240 ml  Output 3000 ml  Net -2760 ml    Vitals:  Vitals:   06/04/21 0706 06/04/21 0736 06/04/21 0806 06/04/21 0850  BP: 109/62 119/65 112/60 103/62  Pulse: 90 93 96 92  Resp: (!) 24 (!) 32 (!) 22 (!) 22  Temp:    98.6 F (37 C)  TempSrc:    Oral  SpO2: 99% 100% 98% 93%  Weight:   118 kg   Height:         Physical Exam:   General adult male in bed seen in transport to 2heart , altered HEENT normocephalic atraumatic extraocular movements intact sclera anicteric Neck supple trachea midline Lungs on bipap Heart S1S2 no rub Abdomen soft nontender nondistended/obese habitus Extremities trace edema lower extremities Neuro minimal response to sternal rub  Access LUE AVF  Medications reviewed   Labs:  BMP Latest Ref Rng & Units 06/04/2021 06/03/2021 06/02/2021  Glucose 70 - 99 mg/dL 100(H) 184(H) 110(H)  BUN 8 - 23 mg/dL 60(H) 142(H) 103(H)  Creatinine 0.61 - 1.24 mg/dL 4.40(H) 9.07(H) 7.53(H)  Sodium 135 - 145 mmol/L 133(L) 127(L) 132(L)  Potassium 3.5 - 5.1 mmol/L 3.0(L) 5.5(H) 5.3(H)  Chloride 98 - 111 mmol/L 93(L) 87(L) 89(L)  CO2 22 - 32 mmol/L 24 17(L) 21(L)  Calcium 8.9 - 10.3 mg/dL 7.4(L) 7.1(L) 7.8(L)   Outpatient dialysis orders:  [facility] MWF Outpt prescription: F180, BFR 450, 4hrs, 2 K, 2 Ca, EDW 123kg AVF Meds - mircera 225 mcg every 2 weeks - last received 200 mcg on 05/15/21 and was ordered  to increase venofer 100 mg (ordered for 10 doses) Act vit D not ordered  Assessment/Plan:   # ESRD:  Left below his EDW at 121 last outpatient tx.  It was felt most of his hypoxia was related to covid.  Expect weight loss with covid though - usually HD per MWF schedule - got HD very early am on 1/3.  - assess dialysis needs daily.  Anticipate next tx likely off schedule on 1/5 unless intervention needed sooner - challenging his EDW as tolerated - note he is on BID lasix here - listed as daily on non-HD days per home med list. Agree with continuing BID while here given HD staffing. Would resume home regimen on discharge - on renal diet   # Encephalopathy - altered and en route to the ICU. Min response to sternal rub on 1/3.  Team is working up - unsure if related to HD.  If needed can give a mini bolus 250 ml - he is below EDW but appears to have lost weight with covid   # Volume/ hypertension: EDW 123kg. But left below EDW at 121 last outpatient tx on 12/30.  Weights here do not appear charted correctly (118 kg charted as pre-HD weight) - perhaps is post as 3 kg  was removed    # Anemia of Chronic Kidney Disease: Ferritin 5300 so cant use iron. Resume ESA - aranesp 200 mcg every Monday to start 1/2.  He missed the 1/2 dose - rescheduled to 1/3   # Secondary Hyperparathyroidism/Hyperphosphatemia: hyperphos. Improved with auryxia and HD. Increased to 2.5 calcium bath    #Acute hypoxic respiratory failure secondary to COVID-pneumonia: Treatment per primary team. On dexamethasone. S/p remdesivir    #Atrial Fibrillation w/ RVR: Management per primary team. On amio   #Hyperkalemia: resolved with HD.  Labs were post-HD today - do not replete potassium please  Disposition - per primary team; he is going to the ICU  Claudia Desanctis, MD 06/04/2021 11:05 AM

## 2021-06-04 NOTE — Progress Notes (Signed)
Progress Note    Richard Hall  VVO:[id number] DOB: 19-Sep-1955  DOA: 05/31/2021 PCP: [name], MD    Brief Narrative:     Medical records reviewed and are as summarized below:  Richard Hall is an 66 y.o. male with medical history significant of anemia, atrial fibrillation, CHF, ESRD on HD, chronic respiratory failure, diabetes, neuropathy, hyperlipidemia, hypertension, hypothyroidism, OSA, history of CVA, prostate cancer who presents from dialysis with shortness of breath.  Found to be COVID + also in a fib with RVR.  Initially required Bipap and was placed in ICU under [place] due to COVID status.  Was able to come off Bipap and was transferred to the floor.   S/p HD 1/2.  Was stable.improving until AM of 1/3 when nursing stated he was minimally responsive.  ABG pending.  Bipap order placed as he will wake up/open eyes to stimulation.  Will transfer to ICU and get PCCM consult.     Assessment/Plan:   Decreased responsiveness -initially thought to be due to CO retention- ABG not reflective Ammonia ordered along with repeat blood sugar (97 from 111) -on elqiuis so ? Need for head CT -does not appear to have gotten any sedating medications overnight  Acute on chronic respiratory failure with hypoxia -initially on Bipap-- weaned to O2 but had worsening respiratory status on 1/3 and Bipap replaced.-- will have to transfer to the ICU for BIPAP. -wears 2L PRN at home -ABG resulted without expected elevation of CO2 -d/c CPAP QHS and while napping order (was not wearing here)  COVID-19 infection -daily labs -IV Steroids -IV remdesivir stopped due to increased AST/ALT after discussion with pharmacy -on eliquis  CHF exacerbation/Atrial fibrillation with RVR/Volume overload > Patient presenting with acute respiratory failure/shortness of breath from dialysis.  > Outside records show recent echo status post watchman procedure for A. fib showed EF 50-55% -cardiology consult  appreciated - Strict I's and O's/daily weights -- Continue home Eliquis but at the correct dose of 5mg  BID, metoprolol  -amiodarone per cards  Diabetes - SSI    ESRD > On dialysis Monday Wednesday Friday.   > Nephrology consulted  - Dialysis 1/3 early AM- 3L removed   Transaminitis > Noted to have new AST and ALT elevation.   Other LFTs are normal.  Possibility of shock liver in the setting of volume overload A. fib and CHF exacerbation as above. Vs covid vs medication related -hepatitis panel negative -d/c statin -trending down currently   Anemia: anemia of chronic kidney disease > Hgb flat -type and screen -? Volume dilution -no reported bleeding -transfuse with HD?  Hypothyroidism - Continue home Synthroid   Severe mitral regurgitaiton:  Noted on TTE.  Recommended TEE as an outpatient once recovered from COVID-19.  Prostate cancer - Continue home tamsulosin   Hypertension - Continue home metoprolol with holding parameters -resume HD   Hyperkalemia [place]  Hyponatremia -suspect due to volume overload -daily labs  Hyperlipidemia History of CVA - hold statin as liver enzymes elevated   OSA - has not been wearing CPAP QHS   obesity Estimated body mass index is 35.28 kg/m as calculated from the following:   Height as of this encounter: 6' (1.829 m).   Weight as of this encounter: 118 kg.    Family Communication/Anticipated D/C date and plan/Code Status   DVT prophylaxis: eliquis Code Status: Full Code.  Disposition Plan: Status is: inpt    Medical Consultants:   [facility] Renal PCCM  Subjective:  Got HD early this AM-- report from nursing overnight is patient slept all night- no other events   Objective:    Vitals:   06/04/21 0706 06/04/21 0736 06/04/21 0806 06/04/21 0850  BP: 109/62 119/65 112/60 103/62  Pulse: 90 93 96 92  Resp: (!) 24 (!) 32 (!) 22 (!) 22  Temp:    98.6 F (37 C)  TempSrc:    Oral  SpO2: 99% 100% 98% 93%   Weight:   118 kg   Height:        Intake/Output Summary (Last 24 hours) at 06/04/2021 1014 Last data filed at 06/04/2021 2353 Gross per 24 hour  Intake 240 ml  Output 3000 ml  Net -2760 ml   Filed Weights   06/02/21 0500 06/04/21 0306 06/04/21 0806  Weight: 120.6 kg 121.5 kg 118 kg    Exam:   General: Appearance:    Obese male in no acute distress   Pupils equal  Lungs:     Diminished, on BIPAP currently  Heart:    Normal heart rate. irregular  MS:   All extremities are intact.  Withdraws to pain   Neurologic:   Will awaken to sternal rub, opens eyes and nods          Data Reviewed:   I have personally reviewed following labs and imaging studies:  Labs: Labs show the following:   Basic Metabolic Panel: Recent Labs  Lab 05/31/21 1700 05/31/21 1717 06/01/21 0232 06/02/21 0214 06/03/21 0358 06/04/21 0620  NA 134* 132*   133* 131* 132* 127* 133*  K 3.9 3.8   3.8 4.5 5.3* 5.5* 3.0*  CL 91* 91* 90* 89* 87* 93*  CO2 27  --  23 21* 17* 24  GLUCOSE 93 88 211* 110* 184* 100*  BUN 49* 52* 60* 103* 142* 60*  CREATININE 5.09* 5.10* 6.01* 7.53* 9.07* 4.40*  CALCIUM 7.8*  --  7.7* 7.8* 7.1* 7.4*  MG  --   --  2.4 2.7* 2.8* 2.1  PHOS  --   --  8.5* >30.0* >30.0* 4.8*   GFR Estimated Creatinine Clearance: 21.9 mL/min (A) (by C-G formula based on SCr of 4.4 mg/dL (H)). Liver Function Tests: Recent Labs  Lab 05/31/21 1700 06/01/21 0232 06/02/21 0214 06/03/21 0358 06/04/21 0620  AST 463* 468* 1,365* 601* 464*  ALT 229* 245* 618* 494* 466*  ALKPHOS 112 87 119 111 113  BILITOT 0.4 0.7 0.8 1.2 0.7  PROT 7.7 6.8 6.8 6.5 6.7  ALBUMIN 3.0* 2.7* 2.6* 2.6* 2.5*   No results for input(s): LIPASE, AMYLASE in the last 168 hours. No results for input(s): AMMONIA in the last 168 hours. Coagulation profile No results for input(s): INR, PROTIME in the last 168 hours.  CBC: Recent Labs  Lab 05/31/21 1700 05/31/21 1717 06/01/21 0232 06/02/21 0214 06/03/21 0358  06/04/21 0620  WBC 12.1*  --  6.2 10.6* 14.9* 17.9*  NEUTROABS 10.1*  --   --   --   --   --   HGB 8.7* 9.9*   9.9* 7.2* 7.2* 7.3* 7.5*  HCT 27.2* 29.0*   29.0* 22.9* 23.7* 23.3* 23.1*  MCV 91.0  --  92.3 92.6 91.4 88.5  PLT 269  --  217 258 276 312   Cardiac Enzymes: No results for input(s): CKTOTAL, CKMB, CKMBINDEX, TROPONINI in the last 168 hours. BNP (last 3 results) No results for input(s): PROBNP in the last 8760 hours. CBG: Recent Labs  Lab 06/03/21 0606 06/03/21 1201  06/03/21 1639 06/03/21 2035 06/04/21 0929  GLUCAP 194* 260* 334* 153* 111*   D-Dimer: Recent Labs    06/03/21 0358 06/04/21 0620  DDIMER 1.33* 1.66*   Hgb A1c: No results for input(s): HGBA1C in the last 72 hours.  Lipid Profile: No results for input(s): CHOL, HDL, LDLCALC, TRIG, CHOLHDL, LDLDIRECT in the last 72 hours. Thyroid function studies: No results for input(s): TSH, T4TOTAL, T3FREE, THYROIDAB in the last 72 hours.  Invalid input(s): FREET3 Anemia work up: Recent Labs    06/03/21 0358 06/04/21 0620  FERRITIN 3,920* 2,878*   Sepsis Labs: Recent Labs  Lab 06/01/21 0232 06/02/21 0214 06/03/21 0358 06/04/21 0620  WBC 6.2 10.6* 14.9* 17.9*    Microbiology Recent Results (from the past 240 hour(s))  Resp Panel by RT-PCR (Flu A&B, Covid) Nasopharyngeal Swab     Status: Abnormal   Collection Time: 05/31/21  4:48 PM   Specimen: Nasopharyngeal Swab; Nasopharyngeal(NP) swabs in vial transport medium  Result Value Ref Range Status   SARS Coronavirus 2 by RT PCR POSITIVE (A) NEGATIVE Final    Comment: (NOTE) SARS-CoV-2 target nucleic acids are DETECTED.  The SARS-CoV-2 RNA is generally detectable in upper respiratory specimens during the acute phase of infection. Positive results are indicative of the presence of the identified virus, but do not rule out bacterial infection or co-infection with other pathogens not detected by the test. Clinical correlation with patient history  and other diagnostic information is necessary to determine patient infection status. The expected result is Negative.  Fact Sheet for Patients: EntrepreneurPulse.com.au  Fact Sheet for Healthcare Providers: IncredibleEmployment.be  This test is not yet approved or cleared by the [place] FDA and  has been authorized for detection and/or diagnosis of SARS-CoV-2 by FDA under an Emergency Use Authorization (EUA).  This EUA will remain in effect (meaning this test can be used) for the duration of  the COVID-19 declaration under Section 564(b)(1) of the A ct, 21 U.S.C. section 360bbb-3(b)(1), unless the authorization is terminated or revoked sooner.     Influenza A by PCR NEGATIVE NEGATIVE Final   Influenza B by PCR NEGATIVE NEGATIVE Final    Comment: (NOTE) The Xpert Xpress SARS-CoV-2/FLU/RSV plus assay is intended as an aid in the diagnosis of influenza from Nasopharyngeal swab specimens and should not be used as a sole basis for treatment. Nasal washings and aspirates are unacceptable for Xpert Xpress SARS-CoV-2/FLU/RSV testing.  Fact Sheet for Patients: EntrepreneurPulse.com.au  Fact Sheet for Healthcare Providers: IncredibleEmployment.be  This test is not yet approved or cleared by the [place] FDA and has been authorized for detection and/or diagnosis of SARS-CoV-2 by FDA under an Emergency Use Authorization (EUA). This EUA will remain in effect (meaning this test can be used) for the duration of the COVID-19 declaration under Section 564(b)(1) of the Act, 21 U.S.C. section 360bbb-3(b)(1), unless the authorization is terminated or revoked.  Performed at [facility], [place] [address] [place], [place] [address]   MRSA Next Gen by PCR, Nasal     Status: None   Collection Time: 06/01/21  1:23 AM   Specimen: Nasal Mucosa; Nasal Swab  Result Value Ref Range Status   MRSA by PCR Next Gen  NOT DETECTED NOT DETECTED Final    Comment: (NOTE) The GeneXpert MRSA Assay (FDA approved for NASAL specimens only), is one component of a comprehensive MRSA colonization surveillance program. It is not intended to diagnose MRSA infection nor to guide or monitor treatment for MRSA infections. Test  performance is not FDA approved in patients less than 24 years old. Performed at [facility], [place] [address] [place], [place] [address]     Procedures and diagnostic studies:  No results found.  Medications:    [START ON 06/16/2021] amiodarone  200 mg Oral Daily   amiodarone  200 mg Oral BID   apixaban  5 mg Oral BID   chlorhexidine  15 mL Mouth Rinse BID   Chlorhexidine Gluconate Cloth  6 each Topical Q0600   Chlorhexidine Gluconate Cloth  6 each Topical Q0600   darbepoetin (ARANESP) injection - DIALYSIS  200 mcg Intravenous Q Mon-HD   dexamethasone (DECADRON) injection  4 mg Intravenous Q24H   ferric citrate  840 mg Oral TID WC   furosemide  80 mg Intravenous BID   insulin aspart  0-5 Units Subcutaneous QHS   insulin aspart  0-9 Units Subcutaneous TID WC   levothyroxine  100 mcg Oral QAC breakfast   linaclotide  72 mcg Oral QAC breakfast   mouth rinse  15 mL Mouth Rinse q12n4p   metoprolol tartrate  25 mg Oral BID   sertraline  100 mg Oral Daily   sodium chloride flush  3 mL Intravenous Q12H   tamsulosin  0.4 mg Oral Daily   Continuous Infusions:     LOS: 3 days   Geradine Girt  [facility]   How to contact the [facility] Attending or Consulting provider [place] or covering provider during after hours [place], for this patient?  Check the care team in [facility] and look for a) attending/consulting TRH provider listed and b) the [facility] team listed Log into [email or web address] and use [place]'s universal password to access. If you do not have the password, please contact the hospital operator. Locate the [facility] provider you are looking for under [facility] and page to a  number that you can be directly reached. If you still have difficulty reaching the provider, please page the [facility] (Director on Call) for the Hospitalists listed on amion for assistance.  06/04/2021, 10:14 AM

## 2021-06-04 NOTE — Progress Notes (Signed)
eLink Physician-Brief Progress Note Patient Name: Richard Hall DOB: [birth year] MRN: [id number]   Date of Service  06/04/2021  HPI/Events of Note  Patient is on BIPAP and cannot take PO medications.  eICU Interventions  Non-essential medication temporarily discontinued, essential medications converted to iv route.        [name] 06/04/2021, 11:34 PM

## 2021-06-04 NOTE — Progress Notes (Signed)
PT Cancellation Note  Patient Details Name: Richard Hall MRN: [id number] DOB: 14-Feb-1956   Cancelled Treatment:    Reason Eval/Treat Not Completed: Medical issues which prohibited therapy;Patient's level of consciousness;Patient not medically ready Pt just transferred to ICU due to decreased level of arousal and need for BiPAP. Will follow up when medically appropriate.   Marguarite Arbour A Elexa Kivi 06/04/2021, 11:33 AM Marisa Severin, PT, DPT Acute Rehabilitation Services Pager [phone] Office [phone]

## 2021-06-04 NOTE — Progress Notes (Signed)
Patient was transported to [place] on BIPAP without any complications. Report was given to [place] RT.

## 2021-06-05 ENCOUNTER — Inpatient Hospital Stay (HOSPITAL_COMMUNITY): Payer: Medicare Other

## 2021-06-05 DIAGNOSIS — I4891 Unspecified atrial fibrillation: Secondary | ICD-10-CM | POA: Diagnosis not present

## 2021-06-05 DIAGNOSIS — J440 Chronic obstructive pulmonary disease with acute lower respiratory infection: Secondary | ICD-10-CM

## 2021-06-05 LAB — BLOOD CULTURE ID PANEL (REFLEXED) - BCID2

## 2021-06-05 LAB — RESPIRATORY PANEL BY PCR

## 2021-06-05 LAB — BASIC METABOLIC PANEL WITH GFR
Anion gap: 17 — ABNORMAL HIGH (ref 5–15)
BUN: 77 mg/dL — ABNORMAL HIGH (ref 8–23)
CO2: 22 mmol/L (ref 22–32)
Calcium: 7.5 mg/dL — ABNORMAL LOW (ref 8.9–10.3)
Chloride: 93 mmol/L — ABNORMAL LOW (ref 98–111)
Creatinine, Ser: 6.58 mg/dL — ABNORMAL HIGH (ref 0.61–1.24)
GFR, Estimated: 9 mL/min — ABNORMAL LOW
Glucose, Bld: 161 mg/dL — ABNORMAL HIGH (ref 70–99)
Potassium: 4.5 mmol/L (ref 3.5–5.1)
Sodium: 132 mmol/L — ABNORMAL LOW (ref 135–145)

## 2021-06-05 LAB — CBC
HCT: 24.4 % — ABNORMAL LOW (ref 39.0–52.0)
Hemoglobin: 7.7 g/dL — ABNORMAL LOW (ref 13.0–17.0)
MCH: 29.1 pg (ref 26.0–34.0)
MCHC: 31.6 g/dL (ref 30.0–36.0)
MCV: 92.1 fL (ref 80.0–100.0)
Platelets: 291 K/uL (ref 150–400)
RBC: 2.65 MIL/uL — ABNORMAL LOW (ref 4.22–5.81)
RDW: 18.7 % — ABNORMAL HIGH (ref 11.5–15.5)
WBC: 17.5 K/uL — ABNORMAL HIGH (ref 4.0–10.5)
nRBC: 0.8 % — ABNORMAL HIGH (ref 0.0–0.2)

## 2021-06-05 LAB — GLUCOSE, CAPILLARY
Glucose-Capillary: 166 mg/dL — ABNORMAL HIGH (ref 70–99)
Glucose-Capillary: 172 mg/dL — ABNORMAL HIGH (ref 70–99)
Glucose-Capillary: 177 mg/dL — ABNORMAL HIGH (ref 70–99)
Glucose-Capillary: 199 mg/dL — ABNORMAL HIGH (ref 70–99)
Glucose-Capillary: 200 mg/dL — ABNORMAL HIGH (ref 70–99)
Glucose-Capillary: 254 mg/dL — ABNORMAL HIGH (ref 70–99)
Glucose-Capillary: 317 mg/dL — ABNORMAL HIGH (ref 70–99)

## 2021-06-05 LAB — PHOSPHORUS
Phosphorus: 7.4 mg/dL — ABNORMAL HIGH (ref 2.5–4.6)
Phosphorus: 7.4 mg/dL — ABNORMAL HIGH (ref 2.5–4.6)
Phosphorus: 6.9 mg/dL — ABNORMAL HIGH (ref 2.5–4.6)

## 2021-06-05 LAB — POCT I-STAT 7, (LYTES, BLD GAS, ICA,H+H)
Acid-Base Excess: 0 mmol/L (ref 0.0–2.0)
Bicarbonate: 24.4 mmol/L (ref 20.0–28.0)
Calcium, Ion: 0.98 mmol/L — ABNORMAL LOW (ref 1.15–1.40)
HCT: 32 % — ABNORMAL LOW (ref 39.0–52.0)
Hemoglobin: 10.9 g/dL — ABNORMAL LOW (ref 13.0–17.0)
O2 Saturation: 96 %
Patient temperature: 99.3
Potassium: 4.4 mmol/L (ref 3.5–5.1)
Sodium: 131 mmol/L — ABNORMAL LOW (ref 135–145)
TCO2: 26 mmol/L (ref 22–32)
pCO2 arterial: 39.9 mmHg (ref 32.0–48.0)
pH, Arterial: 7.396 (ref 7.350–7.450)
pO2, Arterial: 81 mmHg — ABNORMAL LOW (ref 83.0–108.0)

## 2021-06-05 LAB — D-DIMER, QUANTITATIVE: D-Dimer, Quant: 2.69 ug{FEU}/mL — ABNORMAL HIGH (ref 0.00–0.50)

## 2021-06-05 LAB — MAGNESIUM
Magnesium: 2.3 mg/dL (ref 1.7–2.4)
Magnesium: 2.3 mg/dL (ref 1.7–2.4)
Magnesium: 2.3 mg/dL (ref 1.7–2.4)

## 2021-06-05 LAB — C-REACTIVE PROTEIN: CRP: 19.3 mg/dL — ABNORMAL HIGH

## 2021-06-05 LAB — FERRITIN: Ferritin: 1639 ng/mL — ABNORMAL HIGH (ref 24–336)

## 2021-06-05 MED ORDER — VANCOMYCIN VARIABLE DOSE PER UNSTABLE RENAL FUNCTION (PHARMACIST DOSING): Status: DC

## 2021-06-05 MED ORDER — VANCOMYCIN HCL 2000 MG/400ML IV SOLN
2000.0000 mg | Freq: Once | INTRAVENOUS | Status: AC
  Administered 2021-06-05: 2000 mg via INTRAVENOUS
  Filled 2021-06-05: qty 400

## 2021-06-05 MED ORDER — SODIUM CHLORIDE 0.9 % IV SOLN
1.0000 g | INTRAVENOUS | Status: DC
  Administered 2021-06-05: 1 g via INTRAVENOUS
  Filled 2021-06-05 (×2): qty 1

## 2021-06-05 MED ORDER — DARBEPOETIN ALFA 200 MCG/0.4ML IJ SOSY
200.0000 ug | PREFILLED_SYRINGE | Freq: Once | INTRAMUSCULAR | Status: AC
  Administered 2021-06-05: 200 ug via INTRAVENOUS
  Filled 2021-06-05: qty 0.4

## 2021-06-05 MED ORDER — DARBEPOETIN ALFA 200 MCG/0.4ML IJ SOSY: 200.0000 ug | PREFILLED_SYRINGE | INTRAMUSCULAR | Status: DC

## 2021-06-05 MED ORDER — NEPRO/CARBSTEADY PO LIQD
1000.0000 mL | Freq: Two times a day (BID) | ORAL | Status: DC
  Administered 2021-06-05 – 2021-06-07 (×5): 1000 mL
  Filled 2021-06-05 (×2): qty 1000

## 2021-06-05 MED ORDER — PROSOURCE TF PO LIQD
45.0000 mL | Freq: Three times a day (TID) | ORAL | Status: DC
  Administered 2021-06-05 – 2021-06-08 (×9): 45 mL
  Filled 2021-06-05 (×10): qty 45

## 2021-06-05 MED ORDER — CHLORHEXIDINE GLUCONATE CLOTH 2 % EX PADS: 6.0000 | MEDICATED_PAD | Freq: Every day | CUTANEOUS | Status: DC

## 2021-06-05 MED ORDER — DEXAMETHASONE SODIUM PHOSPHATE 10 MG/ML IJ SOLN
4.0000 mg | INTRAMUSCULAR | Status: DC
  Administered 2021-06-06 – 2021-06-08 (×3): 4 mg via INTRAVENOUS
  Filled 2021-06-05 (×4): qty 0.4

## 2021-06-05 NOTE — TOC Initial Note (Signed)
Transition of Care [facility]) - Initial/Assessment Note    Patient Details  Name: Richard Hall MRN: [id number] Date of Birth: 1956-04-18  Transition of Care [facility]) CM/SW Contact:    Milas Gain, [place] Phone Number: 06/05/2021, 4:41 PM  Clinical Narrative:                  CSW received consult for possible SNF placement at time of discharge. CSW spoke with patients son [name] regarding PT recommendation of SNF placement at time of discharge. Patients son  expressed understanding of PT recommendation and is agreeable to SNF placement for patient at time of discharge. Patients son gave CSW permission to fax out initial referral near the [place] area. Patient is HD Mon,Wed,Friday [place].CSW discussed insurance authorization process with patients son.Patients son reports patient has received both covid vaccines and reports has been boosted. Patients son reports not sure how many boosters patient has received maybe 2 or 3. CSW informed patients son CSW will continue to follow to fax patient out closer to patient being medically ready for dc. Patient son reports patient has cpap from home and can bring to SNF at time of dc if needed.No further questions reported at this time. CSW to continue to follow and assist with discharge planning needs.   Expected Discharge Plan: Skilled Nursing Facility Barriers to Discharge: Continued Medical Work up   Patient Goals and CMS Choice   CMS Medicare.gov Compare Post Acute Care list provided to:: Patient Represenative (must comment) (spoke with patients son [name]) Choice offered to / list presented to : Adult Children [name])  Expected Discharge Plan and Services Expected Discharge Plan: [place] In-house Referral: Clinical Social Work     Living arrangements for the past 2 months: Single Family Home                                      Prior Living Arrangements/Services Living arrangements for the past 2 months: Single  Family Home Lives with:: Self Patient language and need for interpreter reviewed:: Yes Do you feel safe going back to the place where you live?: No   SNF  Need for Family Participation in Patient Care: Yes (Comment) Care giver support system in place?: Yes (comment)   Criminal Activity/Legal Involvement Pertinent to Current Situation/Hospitalization: No - Comment as needed  Activities of Daily Living      Permission Sought/Granted Permission sought to share information with : Case Manager, Family Supports, Customer service manager Permission granted to share information with : No  Share Information with NAME: Patient currently only oriented to self and time spoke with patients son [name]  Permission granted to share info w AGENCY: Patient currently only oriented to self and time spoke with patients son Rodney/SNF  Permission granted to share info w Relationship: Patient currently only oriented to self and time spoke with patients son [name]  Permission granted to share info w Contact Information: Patient currently only oriented to self and time spoke with patients son [name] [phone]  Emotional Assessment       Orientation: : Oriented to Self, Oriented to  Time Alcohol / Substance Use: Not Applicable Psych Involvement: No (comment)  Admission diagnosis:  Elevated troponin [R77.8] ESRD (end stage renal disease) on dialysis ([place]) [N18.6, Z99.2] Chronic obstructive pulmonary disease with acute lower respiratory infection ([place]) [J44.0] Atrial fibrillation with rapid ventricular response ([place]) [I48.91] Atrial fibrillation with RVR ([place]) [  I48.91] Atrial fibrillation (HCC) [I48.91] Patient Active Problem List   Diagnosis Date Noted   Chronic obstructive pulmonary disease with acute lower respiratory infection ([place])    Elevated troponin    Atrial fibrillation with RVR ([place]) 05/31/2021   Acute on chronic respiratory failure with hypoxia ([place]) 05/31/2021   CHF exacerbation  ([place]) 05/31/2021   COVID-19 virus infection 05/31/2021   Urinary retention 07/26/2020   Prostate cancer metastatic to bone ([place]) 05/18/2019   Fracture 05/17/2019   Hypocalcemia 05/14/2019   Bilateral leg edema 01/28/2019   Symptomatic anemia 01/05/2019   Gross hematuria 01/05/2019   Gastrointestinal hemorrhage with melena 01/05/2019   OSA on CPAP    Hypothyroidism (acquired)    Essential hypertension    Dyslipidemia    Diabetes mellitus with end-stage renal disease ([place])    Acute blood loss anemia    Chronic diastolic congestive heart failure ([place]) 02/09/2018   Anemia, chronic renal failure, stage 4 (severe) ([place]) 11/09/2017   Chronic respiratory failure with hypoxia and hypercapnia (HCC) 11/09/2017   Iron deficiency anemia 10/28/2017   Atrial fibrillation ([place]) 10/07/2017   Morbid obesity with BMI of 40.0-44.9, adult ([place]) 07/14/2016   Diabetic peripheral neuropathy associated with type 2 diabetes mellitus ([place]) 10/10/2015   Allergy 04/30/2015   Bronchitis 04/30/2015   ED (erectile dysfunction) of organic origin 04/30/2015   Lipoma of back 04/30/2015   Microalbuminuria 04/30/2015   Osteoarthritis 04/30/2015   Microalbuminuria due to type 2 diabetes mellitus ([place]) 01/09/2014   ESRD (end stage renal disease) on dialysis ([place]) 07/22/2013   Acute ill-defined cerebrovascular disease 04/04/2013   Dizziness 04/04/2013   Hypercholesterolemia 04/04/2013   Hypoxemia 04/04/2013   Insomnia 04/04/2013   Periodic limb movement disorder 04/04/2013   Diplopia 06/07/2012   Personal history of transient ischemic attack (TIA), and cerebral infarction without residual deficits 05/19/2011   PCP:  [name], MD Pharmacy:   [facility] [address] [place] [address] Phone: [phone] Fax: [phone]     Social Determinants of Health (SDOH) Interventions    Readmission Risk Interventions Readmission Risk Prevention  Plan 01/12/2019  Transportation Screening Complete  PCP or Specialist Appt within 3-5 Days Complete  HRI or Home Care Consult Complete  Social Work Consult for [place] Planning/Counseling Complete  Palliative Care Screening Not Applicable  Medication Review Press photographer) Complete  Some recent data might be hidden

## 2021-06-05 NOTE — Progress Notes (Signed)
[facility] Progress Note  Name: Richard Hall MRN: [id number] DOB: August 27, 1955  Chief Complaint:  Shortness of breath   Subjective:  Moved to 2 heart yesterday am for confusion and decreased responsiveness.  last HD 1/3 early am with 3 kg UF.  No UOP documented over 1/3 and has been on BID IV lasix.  Spoke with RN.  He was on bipap overnight and then was transitioned to 2 liters.  He has been more alert but is delayed which is reproduced on my exam as well.  They weren't able to get a weight as bed is broken.   Review of systems:     Limited given ams Does report shortness of breath   Intake/Output Summary (Last 24 hours) at 06/05/2021 0638 Last data filed at 06/05/2021 0600 Gross per 24 hour  Intake 187.16 ml  Output 3000 ml  Net -2812.84 ml    Vitals:  Vitals:   06/05/21 0530 06/05/21 0600 06/05/21 0628 06/05/21 0630  BP: 113/80 102/63  120/77  Pulse: (!) 104 (!) 102  (!) 110  Resp: (!) 22 (!) 24  17  Temp:      TempSrc:      SpO2: 99% 100% 100% 97%  Weight:      Height:         Physical Exam:   General adult male in bed in NAD at rest on 2 liters HEENT normocephalic atraumatic Neck supple trachea midline Lungs crackles which improve with cough; on 2 liters and unlabored at rest Heart S1S2 no rub Abdomen soft nontender nondistended/obese habitus Extremities no edema lower extremities Neuro oriented times three (even to year of 2023) but is a little delayed; much improved though  Access LUE AVF bruit and thrill   Medications reviewed   Labs:  BMP Latest Ref Rng & Units 06/05/2021 06/04/2021 06/04/2021  Glucose 70 - 99 mg/dL 161(H) 129(H) 100(H)  BUN 8 - 23 mg/dL 77(H) 62(H) 60(H)  Creatinine 0.61 - 1.24 mg/dL 6.58(H) 5.83(H) 4.40(H)  Sodium 135 - 145 mmol/L 132(L) 132(L) 133(L)  Potassium 3.5 - 5.1 mmol/L 4.5 4.4 3.0(L)  Chloride 98 - 111 mmol/L 93(L) 93(L) 93(L)  CO2 22 - 32 mmol/L 22 24 24   Calcium 8.9 - 10.3 mg/dL 7.5(L) 7.4(L) 7.4(L)    Outpatient dialysis orders:  [place] MWF Outpt prescription: F180, BFR 450, 4hrs, 2 K, 2 Ca, EDW 123kg AVF Meds - mircera 225 mcg every 2 weeks - last received 200 mcg on 05/15/21 and was ordered to increase venofer 100 mg (ordered for 10 doses) Act vit D not ordered  Assessment/Plan:   # ESRD:   - Left below his EDW at 121 last outpatient tx.  It was felt most of his hypoxia was related to covid.  Expect weight loss with covid though.  Usually HD per MWF schedule - got HD very early am on 1/3.  - assess dialysis needs daily.  Plan for next tx off schedule on 1/5 unless intervention needed sooner - defer HD today  - challenging his EDW as tolerated  - stop IV lasix - no UOP documented    # Encephalopathy  - unclear etiology  - work-up per primary team - will optimize clearance with HD   # Volume/ hypertension: EDW 123kg. But left below EDW at 121 last outpatient tx on 12/30.  Weights here do not appear charted correctly (118 kg charted as pre-HD weight) - perhaps is post as 3 kg was removed    #  Anemia of Chronic Kidney Disease: Ferritin 5300 so defer iron. Resume ESA - aranesp 200 mcg weekly. He has missed the dose for this week - reschedule to today and then weekly with HD after today   # Secondary Hyperparathyroidism/Hyperphosphatemia: hyperphos. on [place] and HD. Increased to 2.5 calcium bath here   #Acute hypoxic respiratory failure secondary to COVID-pneumonia: Treatment per primary team. On dexamethasone. S/p remdesivir    #Atrial Fibrillation w/ RVR: Management per primary team. On amio   #Hyperkalemia: resolved with HD.    Disposition -  in ICU  Claudia Desanctis, MD 06/05/2021 6:38 AM

## 2021-06-05 NOTE — Progress Notes (Signed)
Alerted to MSSA bacteremia.  Antibiotics have been narrowed to cefazolin to be given with next HD session.   Will see the patient formally 1/05 to assist with work up/management of bacteremia.   Janene Madeira, NP

## 2021-06-05 NOTE — Progress Notes (Signed)
NAME:  Richard Hall, MRN:  [id number], DOB:  March 22, 1956, LOS: 4 ADMISSION DATE:  05/31/2021, CONSULTATION DATE: 06/04/2021 REFERRING MD: Triad, CHIEF COMPLAINT: Altered mental status  History of Present Illness:  66 year old male with a plethora of health issues was admitted on 05/31/2021 shortness of breath was found to be COVID-19 positive.  He is end-stage renal disease underwent dialysis on 06/04/2020 and was a -2 L.  He had decreased level of consciousness on 06/04/2021 and there was concern for hypercarbia but his PCO2 was 46 on ABGs therefore another source of confusion and altered mental status is being evaluated.  He is requiring noninvasive mechanical ventilatory support at this time.  He is going to CT scan for further evaluation of altered mental status to be transferred to intensive care unit first to be stabilized and then go to CT scan.  He has an extensive past medical history is well-documented below.  Pertinent  Medical History   Past Medical History:  Diagnosis Date   A-V fistula (HCC)    left arm   Anemia    Arthritis    CHF (congestive heart failure) (HCC)    CVA (cerebral vascular accident) ([place]) 2010   Diabetes mellitus with end-stage renal disease ([place])    Dyalisis pt.   Diabetic neuropathy (HCC)    Bilateral feet   Dyslipidemia    Dysrhythmia    A-fib   Essential hypertension    Hepatitis    childhood   History of blood transfusion    Hypothyroidism (acquired)    OSA on CPAP    Prostate cancer [facility])    Uses wheelchair      Significant Hospital Events: Including procedures, antibiotic start and stop dates in addition to other pertinent events   06/04/2021 transferred intensive care unit for altered mental status 1/4 tolerated Bipap overnight, remains somewhat lethargic on nasal cannula, though protecting his airway  Studies 1/3 CTH>No acute intracranial hemorrhage or definite evidence of acute infarction. There are few small vessel infarcts of the basal  ganglia bilaterally, but at least some were present previously.   Micro 1/4 Bcx2>  12/30 Covid + 1/4 RVP>   Abx Vancomycin 1/4- Cefepime 1/4-  Interim History / Subjective:  Transferred to ICU yesterday, stable overnight, still lethargic but protecting his airway and ABG without hypercarbia.   Objective   Blood pressure 135/74, pulse (!) 115, temperature (!) 102.4 F (39.1 C), temperature source Oral, resp. rate (!) 22, height 6' (1.829 m), weight 118 kg, SpO2 93 %.    Vent Mode: PCV;BIPAP FiO2 (%):  [30 %] 30 % Set Rate:  [12 bmp] 12 bmp PEEP:  [6 cmH20] 6 cmH20 Pressure Support:  [6 cmH20] 6 cmH20 Plateau Pressure:  [8 cmH20] 8 cmH20   Intake/Output Summary (Last 24 hours) at 06/05/2021 0109 Last data filed at 06/05/2021 0700 Gross per 24 hour  Intake 203.66 ml  Output --  Net 203.66 ml    Filed Weights   06/02/21 0500 06/04/21 0306 06/04/21 0806  Weight: 120.6 kg 121.5 kg 118 kg     General:  obese M, sleeping in bed in no acute distress, arousable to voice HEENT: MM pink/moist, sclera anicteric, pupils equal  Neuro: opens eyes to voice, gives his name and nods to questions and then falls back asleep CV: s1s2 rrr, no m/r/g PULM:  rhonchi bilaterally, breathing comfortably without tachypnea or retractions GI: soft, bsx4 active  Extremities: warm/dry, 2+ edema  Skin: no rashes or lesions  Resolved Hospital Problem list     Assessment & Plan:     Acute Altered mental status in the setting of COVID-19 infection, ESRD stage renal disease status postdialysis Tuesdays Thursdays and Saturdays, history of atrial fibrillation currently on Eliquis.  Transferred to intensive care yesterday for monitoring, possible intubation Head CT with small basal ganglia infarcts, spoke with radiology, perhaps one small infarct was not present on prior imaging, but do not think would be causing clinical acute encephalopathy -ammonia normal, suspect most likely secondary to  sepsis -repeat ABG ok, but continues to be at risk for intubation  -place cortrak today as unable to swallow safely      Hypoxic Respiratory Failure secondary to Covid-19 pneumonia Increasing leukocytosis Doing ok on 2L [place] today -continue Dexamethasone  -concern for secondary bacterial PNA, start Vanc/cefepime and check RVP and blood cultures     History of sleep apnea  Home BiPAP at 30% FiO2 with sats at 96% -tolerated Bipap last night, continue qhs and prn if mental status will tolerate    Atrial fibrillation with rapid ventricular response On amiodarone  -on systemic heparin as has been unable to take po medications 2/2 AMS    Liver enzymes elevated -improving, continue to monitor   Mitral valve regurgitation Noted on most recent 2D echo Continue to monitor  End-stage renal disease Last dialysis 06/04/2020 with -2 L -HD per nephrology  Hypothyroidism -continue Synthroid   Diabetes mellitus -continue SSI   Best Practice (right click and "Reselect all SmartList Selections" daily)   Diet/type: NPO DVT prophylaxis: systemic heparin GI prophylaxis: PPI Lines: N/A Foley:  N/A Code Status:  full code Last date of multidisciplinary goals of care discussion [Pt's son [name] updated via phone 1/4]  Labs   CBC: Recent Labs  Lab 05/31/21 1700 05/31/21 1717 06/01/21 0232 06/02/21 0214 06/03/21 0358 06/04/21 0620 06/05/21 0109  WBC 12.1*  --  6.2 10.6* 14.9* 17.9* 17.5*  NEUTROABS 10.1*  --   --   --   --   --   --   HGB 8.7*   < > 7.2* 7.2* 7.3* 7.5* 7.7*  HCT 27.2*   < > 22.9* 23.7* 23.3* 23.1* 24.4*  MCV 91.0  --  92.3 92.6 91.4 88.5 92.1  PLT 269  --  217 258 276 312 291   < > = values in this interval not displayed.     Basic Metabolic Panel: Recent Labs  Lab 06/01/21 0232 06/02/21 0214 06/03/21 0358 06/04/21 0620 06/04/21 1432 06/05/21 0109  NA 131* 132* 127* 133* 132* 132*  K 4.5 5.3* 5.5* 3.0* 4.4 4.5  CL 90* 89* 87* 93* 93* 93*  CO2  23 21* 17* 24 24 22   GLUCOSE 211* 110* 184* 100* 129* 161*  BUN 60* 103* 142* 60* 62* 77*  CREATININE 6.01* 7.53* 9.07* 4.40* 5.83* 6.58*  CALCIUM 7.7* 7.8* 7.1* 7.4* 7.4* 7.5*  MG 2.4 2.7* 2.8* 2.1  --  2.3  PHOS 8.5* >30.0* >30.0* 4.8*  --  7.4*    GFR: Estimated Creatinine Clearance: 14.7 mL/min (A) (by C-G formula based on SCr of 6.58 mg/dL (H)). Recent Labs  Lab 06/02/21 0214 06/03/21 0358 06/04/21 0620 06/05/21 0109  WBC 10.6* 14.9* 17.9* 17.5*     Liver Function Tests: Recent Labs  Lab 06/01/21 0232 06/02/21 0214 06/03/21 0358 06/04/21 0620 06/04/21 1432  AST 468* 1,365* 601* 464* 345*  ALT 245* 618* 494* 466* 372*  ALKPHOS 87 119 111 113 104  BILITOT  0.7 0.8 1.2 0.7 0.7  PROT 6.8 6.8 6.5 6.7 6.5  ALBUMIN 2.7* 2.6* 2.6* 2.5* 2.2*    No results for input(s): LIPASE, AMYLASE in the last 168 hours. Recent Labs  Lab 06/04/21 1040  AMMONIA 30    ABG    Component Value Date/Time   PHART 7.341 (L) 06/04/2021 0950   PCO2ART 49.1 (H) 06/04/2021 0950   PO2ART 74.7 (L) 06/04/2021 0950   HCO3 25.8 06/04/2021 0950   TCO2 30 05/31/2021 1717   TCO2 34 (H) 05/31/2021 1717   ACIDBASEDEF 2.0 01/07/2019 1618   O2SAT 91.6 06/04/2021 0950      Coagulation Profile: No results for input(s): INR, PROTIME in the last 168 hours.  Cardiac Enzymes: No results for input(s): CKTOTAL, CKMB, CKMBINDEX, TROPONINI in the last 168 hours.  HbA1C: Hgb A1c MFr Bld  Date/Time Value Ref Range Status  06/01/2021 02:32 AM 6.1 (H) 4.8 - 5.6 % Final    Comment:    (NOTE) Pre diabetes:          5.7%-6.4%  Diabetes:              >6.4%  Glycemic control for   <7.0% adults with diabetes   07/26/2020 10:45 AM 10.1 (H) 4.8 - 5.6 % Final    Comment:    (NOTE) Pre diabetes:          5.7%-6.4%  Diabetes:              >6.4%  Glycemic control for   <7.0% adults with diabetes     CBG: Recent Labs  Lab 06/04/21 1616 06/04/21 2018 06/05/21 0013 06/05/21 0402 06/05/21 0747   GLUCAP 127* 140* 166* 172* 177*     Review of Systems:   NA  Past Medical History:  He,  has a past medical history of A-V fistula ([place]), Anemia, Arthritis, CHF (congestive heart failure) ([place]), CVA (cerebral vascular accident) ([place]) (2010), Diabetes mellitus with end-stage renal disease ([place]), Diabetic neuropathy ([place]), Dyslipidemia, Dysrhythmia, Essential hypertension, Hepatitis, History of blood transfusion, Hypothyroidism (acquired), OSA on CPAP, Prostate cancer ([place]), and Uses wheelchair.   Surgical History:   Past Surgical History:  Procedure Laterality Date   APPENDECTOMY     COLONOSCOPY     FEMUR IM NAIL Left 05/20/2019   Procedure: Intramedullary (Im) Retrograde Femoral Nailing for impending pathologic fracture;  Surgeon: Altamese Westminster, MD;  Location: [facility];  Service: Orthopedics;  Laterality: Left;   ORIF FEMUR FRACTURE Left 05/20/2019   Procedure: OPEN REDUCTION INTERNAL FIXATION (ORIF) DISTAL FEMUR FRACTURE with intercondylar extension;  Surgeon: Altamese Plessis, MD;  Location: [facility];  Service: Orthopedics;  Laterality: Left;   TRANSURETHRAL RESECTION OF BLADDER TUMOR N/A 01/07/2019   Procedure: CYSTOSCOPY/ EVEACUATION CLOT;  Surgeon: Bjorn Loser, MD;  Location: [facility];  Service: Urology;  Laterality: N/A;   TRANSURETHRAL RESECTION OF PROSTATE N/A 07/26/2020   Procedure: TRANSURETHRAL RESECTION OF THE PROSTATE (TURP);  Surgeon: Alexis Frock, MD;  Location: [facility];  Service: Urology;  Laterality: N/A;  25 MINS     Social History:   reports that he quit smoking about 26 years ago. He has never used smokeless tobacco. He reports that he does not drink alcohol and does not use drugs.   Family History:  His family history includes Cancer in his mother.   Allergies Allergies  Allergen Reactions   Gabapentin Swelling     Home Medications  Prior to Admission medications   Medication Sig Start Date End Date Taking? Authorizing  Provider  acetaminophen (TYLENOL) 500  MG tablet Take 500-1,000 mg by mouth every 6 (six) hours as needed (for pain.).   Yes [provider]  amitriptyline (ELAVIL) 100 MG tablet Take 1 tablet (100 mg total) by mouth at bedtime. 05/25/19  Yes Christian, Rylee, MD  atorvastatin (LIPITOR) 80 MG tablet Take 80 mg by mouth daily. 03/08/20  Yes [provider]  diphenoxylate-atropine (LOMOTIL) 2.5-0.025 MG tablet Take 1 tablet by mouth 4 (four) times daily as needed for diarrhea or loose stools. 04/23/21  Yes [provider]  ELIQUIS 2.5 MG TABS tablet Take 2.5 mg by mouth 2 (two) times daily. 04/22/21  Yes [provider]  EUTHYROX 88 MCG tablet Take 88 mcg by mouth daily before breakfast. 02/06/20  Yes [provider]  furosemide (LASIX) 80 MG tablet Take 1 tablet (80 mg total) by mouth 4 (four) times a week. Mondays, wednesdays, fridays, and sundays Patient taking differently: Take 80 mg by mouth See admin instructions. Mondays, Wednesdays, and Saturdays 05/26/19  Yes Christian, Rylee, MD  insulin glargine, 1 Unit Dial, (TOUJEO SOLOSTAR) 300 UNIT/ML Solostar Pen Inject 24 Units into the skin at bedtime.   Yes [provider]  LEUPROLIDE ACETATE, 6 MONTH, 45 MG injection Inject 45 mg into the skin every 6 (six) months. 05/25/19  Yes Christian, Rylee, MD  levothyroxine (SYNTHROID) 100 MCG tablet Take 100 mcg by mouth daily. 05/21/21  Yes [provider]  lidocaine-prilocaine (EMLA) cream Apply 1 application topically See admin instructions. Apply small amount to access site 1-2 hours before dialysis, cover with occlusive dressing (saran wrap) 05/25/19  Yes Christian, Rylee, MD  metoprolol tartrate (LOPRESSOR) 25 MG tablet Take 25 mg by mouth 2 (two) times daily. 02/06/20  Yes [provider]  sertraline (ZOLOFT) 100 MG tablet Take 100 mg by mouth daily. 02/06/20  Yes [provider]  XTANDI 40 MG capsule Take 160 mg by mouth at bedtime. 03/12/20  Yes [provider]  ferric citrate (AURYXIA) 1 GM 210 MG(Fe) tablet Take 2 tablets (420 mg total) by mouth 3 (three) times daily with meals. Patient not taking: Reported on 06/01/2021 05/25/19   Mitzi Hansen, MD  fluticasone [facility] ALLERGY RELIEF) 50 MCG/ACT nasal spray Place 1 spray into both nostrils as needed. Patient not taking: Reported on 06/01/2021 05/25/19   Mitzi Hansen, MD  glipiZIDE (GLUCOTROL) 10 MG tablet Take 5 mg by mouth daily. Patient not taking: Reported on 06/01/2021 02/06/20   [provider]  LINZESS 72 MCG capsule Take 1 capsule (72 mcg total) by mouth as needed. Patient not taking: Reported on 06/01/2021 05/25/19   Mitzi Hansen, MD  traMADol (ULTRAM) 50 MG tablet Take 1-2 tablets (50-100 mg total) by mouth every 6 (six) hours as needed for moderate pain or severe pain. Post-operatively Patient not taking: Reported on 06/01/2021 07/27/20 07/27/21  Alexis Frock, MD     Critical care time: 35 min     CRITICAL CARE Performed by: Otilio Carpen Mathis Cashman   Total critical care time: 35 minutes  Critical care time was exclusive of separately billable procedures and treating other patients.  Critical care was necessary to treat or prevent imminent or life-threatening deterioration.  Critical care was time spent personally by me on the following activities: development of treatment plan with patient and/or surrogate as well as nursing, discussions with consultants, evaluation of patient's response to treatment, examination of patient, obtaining history from patient or surrogate, ordering and performing treatments and interventions, ordering and  review of laboratory studies, ordering and review of radiographic studies, pulse oximetry and re-evaluation of patient's condition.    Otilio Carpen Luvia Orzechowski, PA-C [facility] See Amion for pager If no response to pager , please call [phone] until 7pm After 7:00 pm call [place]  [phone]

## 2021-06-05 NOTE — Plan of Care (Signed)
Holding PO meds d/t BiPAP, remains in Afib, incr HR 120-130s. Amio gtt started, lopressor 5 mg IV x 1, HR now 80-100. Follows commands, MAE, weak. Diuresed with lasix but no urine output.   Tmax 102 F, tylenol per rectum, cold cloth, ice packs, blood cultures x 2.   Remains on contact isolation for COVID. Turning q 2 for comfort and skin integrity.   Problem: Education: Goal: Ability to demonstrate management of disease process will improve Outcome: Progressing   Problem: Activity: Goal: Capacity to carry out activities will improve Outcome: Progressing   Problem: Cardiac: Goal: Ability to achieve and maintain adequate cardiopulmonary perfusion will improve Outcome: Progressing   Problem: Clinical Measurements: Goal: Ability to maintain clinical measurements within normal limits will improve Outcome: Progressing Goal: Respiratory complications will improve Outcome: Progressing Goal: Cardiovascular complication will be avoided Outcome: Progressing   Problem: Coping: Goal: Level of anxiety will decrease Outcome: Progressing   Problem: Nutrition: Goal: Adequate nutrition will be maintained Outcome: Not Progressing

## 2021-06-05 NOTE — Progress Notes (Signed)
Pt receives out-pt HD at [facility] on MWF. Pt arrives at 11:00 for 11:15 chair time. Pt uses transportation. Clinic aware pt is covid positive. Will assist as needed.  Melven Sartorius Renal Navigator [phone]

## 2021-06-05 NOTE — Evaluation (Addendum)
Physical Therapy Evaluation Patient Details Name: Richard Hall MRN: [id number] DOB: [birth year] Today's Date: 06/05/2021  History of Present Illness  Pt adm 12/30 with SOB. Found to have afib with rvr, acute on chronic respiratory failure with hypoxia, chf exacerbation, and Covid. On 1/3 pt with decr responsiveness and started on bipap and transferred to ICU. PMH - ESRD on HD, CHf, afib, DM, neuropathy, HTN, CVA, metastatic prostate CA, lt femur ORIF.  Clinical Impression  Pt presents to PT dependent with everything. Currently lethargic and only remaining alert for brief periods and not following commands. Currently recommend SNF if he becomes more alert and can participate more. Will continue to work toward incr mobility and updating goals and dc plan as appropriate.        Recommendations for follow up therapy are one component of a multi-disciplinary discharge planning process, led by the attending physician.  Recommendations may be updated based on patient status, additional functional criteria and insurance authorization.  Follow Up Recommendations Skilled nursing-short term rehab (<3 hours/day)    Assistance Recommended at Discharge Frequent or constant Supervision/Assistance  Patient can return home with the following  Two people to help with walking and/or transfers    Equipment Recommendations Wheelchair (measurements PT);Wheelchair cushion (measurements PT);Other (comment);Hospital bed (mechanical lift)  Recommendations for Other Services       Functional Status Assessment Patient has had a recent decline in their functional status and/or demonstrates limited ability to make significant improvements in function in a reasonable and predictable amount of time     Precautions / Restrictions Precautions Precautions: Fall      Mobility  Bed Mobility               General bed mobility comments: Placed bed in chair position    Transfers                    General transfer comment: Did not attempt due to level of arousal    Ambulation/Gait                  Stairs            Wheelchair Mobility    Modified Rankin (Stroke Patients Only)       Balance                                             Pertinent Vitals/Pain Pain Assessment: Faces Faces Pain Scale: Hurts little more Facial Expression: Relaxed, neutral Body Movements: Absence of movements Muscle Tension: Relaxed Compliance with ventilator (intubated pts.): N/A Vocalization (extubated pts.): Talking in normal tone or no sound CPOT Total: 0 Pain Location: RLE with movement into flex Pain Descriptors / Indicators: Grimacing Pain Intervention(s): Limited activity within patient's tolerance;Monitored during session;Repositioned    Home Living Family/patient expects to be discharged to:: Private residence Living Arrangements: Alone Available Help at Discharge: Available PRN/intermittently Type of Home: Apartment Home Access: Level entry       Home Layout: One level Home Equipment: Cane - single Barista (2 wheels);Tub bench Additional Comments: Pt unable to provide. Above info from prior encounter in 2020    Prior Function Prior Level of Function : Other (comment) (Unknown)                     Hand Dominance   Dominant Hand: Right  Extremity/Trunk Assessment   Upper Extremity Assessment Upper Extremity Assessment: RUE deficits/detail;LUE deficits/detail RUE Deficits / Details: No spontaneous movement or movement to command. PROM WFL LUE Deficits / Details: No spontaneous movement or movement to command. PROM WFL    Lower Extremity Assessment Lower Extremity Assessment: RLE deficits/detail;LLE deficits/detail RLE Deficits / Details: Not moving to command but spontaneous movement noted LLE Deficits / Details: No spontaneous movement or movement to command. Hip ROM limited due to pain/grimacing        Communication   Communication: Receptive difficulties;Expressive difficulties (pt did not attempt to speak)  Cognition Arousal/Alertness: Lethargic Behavior During Therapy: Flat affect Overall Cognitive Status: Difficult to assess                                 General Comments: Pt awakens with verbal/tactile stimuli briefly. Only followed one command. Did not attempt to verbalize. Only shook head no once to multiple yes/no questions        General Comments General comments (skin integrity, edema, etc.): VSS    Exercises     Assessment/Plan    PT Assessment Patient needs continued PT services  PT Problem List Decreased strength;Decreased range of motion;Decreased balance;Decreased mobility;Decreased cognition       PT Treatment Interventions DME instruction;Gait training;Functional mobility training;Therapeutic activities;Therapeutic exercise;Balance training;Cognitive remediation;Patient/family education    PT Goals (Current goals can be found in the Care Plan section)  Acute Rehab PT Goals Patient Stated Goal: pt unable PT Goal Formulation: Patient unable to participate in goal setting Time For Goal Achievement: 06/19/21 Potential to Achieve Goals: Fair    Frequency Min 2X/week     Co-evaluation               AM-PAC PT "6 Clicks" Mobility  Outcome Measure Help needed turning from your back to your side while in a flat bed without using bedrails?: Total Help needed moving from lying on your back to sitting on the side of a flat bed without using bedrails?: Total Help needed moving to and from a bed to a chair (including a wheelchair)?: Total Help needed standing up from a chair using your arms (e.g., wheelchair or bedside chair)?: Total Help needed to walk in hospital room?: Total Help needed climbing 3-5 steps with a railing? : Total 6 Click Score: 6    End of Session   Activity Tolerance: Patient limited by lethargy Patient left: in  bed;with call bell/phone within reach   PT Visit Diagnosis: Other abnormalities of gait and mobility (R26.89)    Time: 1779-3903 PT Time Calculation (min) (ACUTE ONLY): 18 min   Charges:   PT Evaluation $PT Eval Moderate Complexity: 1 Mod          [place] Pager [phone] Office [place] 06/05/2021, 1:08 PM

## 2021-06-05 NOTE — Progress Notes (Signed)
Placed patient on BIPAP for the night.  

## 2021-06-05 NOTE — Progress Notes (Signed)
eLink Physician-Brief Progress Note Patient Name: Richard Hall DOB: 1956/05/07 MRN: [id number]   Date of Service  06/05/2021  HPI/Events of Note  Temperature 102 degrees.  eICU Interventions  Blood cultures ordered x 2.        [name] Octavia Mottola 06/05/2021, 12:57 AM

## 2021-06-05 NOTE — Procedures (Signed)
Cortrak  Person Inserting Tube:  Maylon Peppers C, RD Tube Type:  Cortrak - 43 inches Tube Size:  10 Tube Location:  Left nare Initial Placement:  Stomach Secured by: Bridle Technique Used to Measure Tube Placement:  Marking at nare/corner of mouth Cortrak Secured At:  84 cm  Cortrak Tube Team Note:  Consult received to place a Cortrak feeding tube.   X-ray is required, abdominal x-ray has been ordered by the Cortrak team. Please confirm tube placement before using the Cortrak tube.   If the tube becomes dislodged please keep the tube and contact the Cortrak team at [email or web address] (password [email or web address]) for replacement.  If after hours and replacement cannot be delayed, place a NG tube and confirm placement with an abdominal x-ray.    Lockie Pares., RD, LDN, CNSC See AMiON for contact information

## 2021-06-05 NOTE — Progress Notes (Signed)
PHARMACY - PHYSICIAN COMMUNICATION CRITICAL VALUE ALERT - BLOOD CULTURE IDENTIFICATION (BCID)  Richard Hall is an 66 y.o. male who presented to [facility] on 05/31/2021 , was febrile on 06/04/21 and 2 sets of blood cultures obtained are showing GPC in clusters.  Assessment:  MSSA bacteremia identified - patient with ESRD.  Vancomycin and cefepime was initiated today.  Name of physician (or Provider) Contacted: Dr. Loanne Drilling (CCM) and Dr. Tommy Medal (ID)  Current antibiotics: vancomycin and cefepime  Changes to prescribed antibiotics recommended: narrow antibiotics to cefazolin with the next dose after next dialysis session (planned for 1/5).  ID team will formally see on 1/5.   Results for orders placed or performed during the hospital encounter of 05/31/21  Blood Culture ID Panel (Reflexed) (Collected: 06/05/2021  1:11 AM)  Result Value Ref Range   Enterococcus faecalis NOT DETECTED NOT DETECTED   Enterococcus Faecium NOT DETECTED NOT DETECTED   Listeria monocytogenes NOT DETECTED NOT DETECTED   Staphylococcus species DETECTED (A) NOT DETECTED   Staphylococcus aureus (BCID) DETECTED (A) NOT DETECTED   Staphylococcus epidermidis NOT DETECTED NOT DETECTED   Staphylococcus lugdunensis NOT DETECTED NOT DETECTED   Streptococcus species NOT DETECTED NOT DETECTED   Streptococcus agalactiae NOT DETECTED NOT DETECTED   Streptococcus pneumoniae NOT DETECTED NOT DETECTED   Streptococcus pyogenes NOT DETECTED NOT DETECTED   A.calcoaceticus-baumannii NOT DETECTED NOT DETECTED   Bacteroides fragilis NOT DETECTED NOT DETECTED   Enterobacterales NOT DETECTED NOT DETECTED   Enterobacter cloacae complex NOT DETECTED NOT DETECTED   Escherichia coli NOT DETECTED NOT DETECTED   Klebsiella aerogenes NOT DETECTED NOT DETECTED   Klebsiella oxytoca NOT DETECTED NOT DETECTED   Klebsiella pneumoniae NOT DETECTED NOT DETECTED   Proteus species NOT DETECTED NOT DETECTED   Salmonella species NOT DETECTED NOT  DETECTED   Serratia marcescens NOT DETECTED NOT DETECTED   Haemophilus influenzae NOT DETECTED NOT DETECTED   Neisseria meningitidis NOT DETECTED NOT DETECTED   Pseudomonas aeruginosa NOT DETECTED NOT DETECTED   Stenotrophomonas maltophilia NOT DETECTED NOT DETECTED   Candida albicans NOT DETECTED NOT DETECTED   Candida auris NOT DETECTED NOT DETECTED   Candida glabrata NOT DETECTED NOT DETECTED   Candida krusei NOT DETECTED NOT DETECTED   Candida parapsilosis NOT DETECTED NOT DETECTED   Candida tropicalis NOT DETECTED NOT DETECTED   Cryptococcus neoformans/gattii NOT DETECTED NOT DETECTED   Meth resistant mecA/C and MREJ NOT DETECTED NOT DETECTED    Candie Mile 06/05/2021  3:33 PM

## 2021-06-05 NOTE — Progress Notes (Signed)
Pharmacy Antibiotic Note  Richard Hall is a 66 y.o. male admitted on 05/31/2021 with COVID and treated with remdesivir - stopped with bump in LFTs and dexamethasone.  Patient transferred to ICU with encephalopathy, increasing WBC and febrile Tm 102.4.  Pharmacy has been consulted for Vancomycin and cefepime dosing. Found to have Hospers in BCx - f/u C&S  Plan: Vancomycin 2gm x1 then 1gm TTS with HD Cefepime 1gm q24h   Height: 6' (182.9 cm) Weight:  (bed scale  broken) IBW/kg (Calculated) : 77.6  Temp (24hrs), Avg:100.6 F (38.1 C), Min:99.3 F (37.4 C), Max:102.4 F (39.1 C)  Recent Labs  Lab 06/01/21 0232 06/02/21 0214 06/03/21 0358 06/04/21 0620 06/04/21 1432 06/05/21 0109  WBC 6.2 10.6* 14.9* 17.9*  --  17.5*  CREATININE 6.01* 7.53* 9.07* 4.40* 5.83* 6.58*    Estimated Creatinine Clearance: 14.7 mL/min (A) (by C-G formula based on SCr of 6.58 mg/dL (H)).    Allergies  Allergen Reactions   Gabapentin Swelling    Antimicrobials this admission:  Dose adjustments this admission:   Microbiology results: 1/4 Bcx GPC  1/4 resp cx 12/30 MRSA PCR negative   Bonnita Nasuti Pharm.D. CPP, BCPS Clinical Pharmacist [phone] 06/05/2021 2:21 PM

## 2021-06-05 NOTE — Progress Notes (Signed)
Initial Nutrition Assessment  DOCUMENTATION CODES:   Non-severe (moderate) malnutrition in context of chronic illness  INTERVENTION:   Tube Feeding via Cortrak:  Nepro at 50 ml/hr Pro-Source TF 45 mL TID Provides 2240 kcals, 130 g of protein, 872 mL of free water  Consider resuming phosphorus binder if phosphorus remains elevated  NUTRITION DIAGNOSIS:   Moderate Malnutrition related to chronic illness (ESRD/HD, CHF) as evidenced by mild muscle depletion, mild fat depletion.  GOAL:   Patient will meet greater than or equal to 90% of their needs  MONITOR:   TF tolerance, Labs, Diet advancement, Weight trends  REASON FOR ASSESSMENT:   Rounds (Cortrak, ESRD)    ASSESSMENT:   66 yo male admitted with 12/30 with SOB with +COVID-19 infection. Pt with decreased level of consciousness on 1/03 and transferred to ICU. PMH includes DM, ESRD/HD, CHF, CVA, HTN, hepatitis, hx prostate cancer. Per MD note, pt utilizes wheelchair  Work-up for AMS ongoing. CT head with small basal ganglia infarcts, but only 1 possible new infarct. MD suspects AMS related to sepsis.  Febrile  Currently NPO with plan for Cortrak today. Off Bipap, currently at 2L [place]. Pt very lethargic, minimally responsive  EDW 123 kg-left last outpatient iHD treatment (12/30) below EDW at 121 kg.  Last iHD on 1/03 with UF 3 kg. Current wt 118 kg post iHD on 1/03  Appears pt was previously on Auryxia for binder therapy but per home med review pt has not taken in at least 30 days. No other phos binder noted  Labs: phosphorus 7.4 (H), sodium 131 (L), potassium 4.4 (wdl), ionized calcium 0.98 (L) Meds: aranesp, decadron, ss novolog   NUTRITION - FOCUSED PHYSICAL EXAM:  Flowsheet Row Most Recent Value  Orbital Region No depletion  Upper Arm Region Mild depletion  Thoracic and Lumbar Region No depletion  Buccal Region Mild depletion  Temple Region Mild depletion  Clavicle Bone Region No depletion  Clavicle and  Acromion Bone Region No depletion  Scapular Bone Region No depletion  Dorsal Hand Mild depletion  [R hand edematous]  Patellar Region Mild depletion  Anterior Thigh Region Mild depletion  Posterior Calf Region No depletion  Edema (RD Assessment) Mild  Hair Reviewed  Eyes Unable to assess  Mouth --  [missing teeth, mouth breathing]  Skin Reviewed  Nails Reviewed  [thin]       Diet Order:   Diet Order             Diet NPO time specified  Diet effective now                   EDUCATION NEEDS:   Not appropriate for education at this time  Skin:  Skin Assessment: Reviewed RN Assessment  Last BM:  1/03  Height:   Ht Readings from Last 1 Encounters:  06/01/21 6' (1.829 m)    Weight:   Wt Readings from Last 1 Encounters:  06/04/21 118 kg    BMI:  Body mass index is 35.28 kg/m.  Estimated Nutritional Needs:   Kcal:  2200-2400 kcals  Protein:  122-146 g  Fluid:  1000 ml plus UOP  Kerman Passey MS, RDN, LDN, CNSC Registered Dietitian III Clinical Nutrition RD Pager and On-Call Pager Number Located in [place]

## 2021-06-06 ENCOUNTER — Inpatient Hospital Stay (HOSPITAL_COMMUNITY): Payer: Medicare Other

## 2021-06-06 DIAGNOSIS — R7881 Bacteremia: Secondary | ICD-10-CM

## 2021-06-06 DIAGNOSIS — I4891 Unspecified atrial fibrillation: Secondary | ICD-10-CM | POA: Diagnosis not present

## 2021-06-06 DIAGNOSIS — E440 Moderate protein-calorie malnutrition: Secondary | ICD-10-CM | POA: Insufficient documentation

## 2021-06-06 DIAGNOSIS — B9561 Methicillin susceptible Staphylococcus aureus infection as the cause of diseases classified elsewhere: Secondary | ICD-10-CM

## 2021-06-06 DIAGNOSIS — G9340 Encephalopathy, unspecified: Secondary | ICD-10-CM

## 2021-06-06 LAB — CBC
HCT: 22 % — ABNORMAL LOW (ref 39.0–52.0)
HCT: 23.7 % — ABNORMAL LOW (ref 39.0–52.0)
Hemoglobin: 6.9 g/dL — CL (ref 13.0–17.0)
Hemoglobin: 7.4 g/dL — ABNORMAL LOW (ref 13.0–17.0)
MCH: 29.7 pg (ref 26.0–34.0)
MCH: 28.9 pg (ref 26.0–34.0)
MCHC: 31.4 g/dL (ref 30.0–36.0)
MCHC: 31.2 g/dL (ref 30.0–36.0)
MCV: 94.8 fL (ref 80.0–100.0)
MCV: 92.6 fL (ref 80.0–100.0)
Platelets: 215 10*3/uL (ref 150–400)
Platelets: 229 10*3/uL (ref 150–400)
RBC: 2.32 MIL/uL — ABNORMAL LOW (ref 4.22–5.81)
RBC: 2.56 MIL/uL — ABNORMAL LOW (ref 4.22–5.81)
RDW: 19.1 % — ABNORMAL HIGH (ref 11.5–15.5)
RDW: 18.9 % — ABNORMAL HIGH (ref 11.5–15.5)
WBC: 8.9 10*3/uL (ref 4.0–10.5)
WBC: 10 10*3/uL (ref 4.0–10.5)
nRBC: 0.8 % — ABNORMAL HIGH (ref 0.0–0.2)
nRBC: 0.8 % — ABNORMAL HIGH (ref 0.0–0.2)

## 2021-06-06 LAB — GLUCOSE, CAPILLARY
Glucose-Capillary: 204 mg/dL — ABNORMAL HIGH (ref 70–99)
Glucose-Capillary: 281 mg/dL — ABNORMAL HIGH (ref 70–99)
Glucose-Capillary: 307 mg/dL — ABNORMAL HIGH (ref 70–99)
Glucose-Capillary: 311 mg/dL — ABNORMAL HIGH (ref 70–99)
Glucose-Capillary: 329 mg/dL — ABNORMAL HIGH (ref 70–99)
Glucose-Capillary: 338 mg/dL — ABNORMAL HIGH (ref 70–99)

## 2021-06-06 LAB — POCT I-STAT 7, (LYTES, BLD GAS, ICA,H+H)
Acid-Base Excess: 0 mmol/L (ref 0.0–2.0)
Acid-base deficit: 5 mmol/L — ABNORMAL HIGH (ref 0.0–2.0)
Bicarbonate: 20.8 mmol/L (ref 20.0–28.0)
Bicarbonate: 27 mmol/L (ref 20.0–28.0)
Calcium, Ion: 0.99 mmol/L — ABNORMAL LOW (ref 1.15–1.40)
Calcium, Ion: 1.08 mmol/L — ABNORMAL LOW (ref 1.15–1.40)
HCT: 25 % — ABNORMAL LOW (ref 39.0–52.0)
HCT: 26 % — ABNORMAL LOW (ref 39.0–52.0)
Hemoglobin: 8.5 g/dL — ABNORMAL LOW (ref 13.0–17.0)
Hemoglobin: 8.8 g/dL — ABNORMAL LOW (ref 13.0–17.0)
O2 Saturation: 98 %
O2 Saturation: 100 %
Patient temperature: 98.3
Patient temperature: 97.5
Potassium: 4.4 mmol/L (ref 3.5–5.1)
Potassium: 3.8 mmol/L (ref 3.5–5.1)
Sodium: 129 mmol/L — ABNORMAL LOW (ref 135–145)
Sodium: 132 mmol/L — ABNORMAL LOW (ref 135–145)
TCO2: 22 mmol/L (ref 22–32)
TCO2: 29 mmol/L (ref 22–32)
pCO2 arterial: 40.2 mmHg (ref 32.0–48.0)
pCO2 arterial: 56.8 mmHg — ABNORMAL HIGH (ref 32.0–48.0)
pH, Arterial: 7.32 — ABNORMAL LOW (ref 7.350–7.450)
pH, Arterial: 7.283 — ABNORMAL LOW (ref 7.350–7.450)
pO2, Arterial: 107 mmHg (ref 83.0–108.0)
pO2, Arterial: 485 mmHg — ABNORMAL HIGH (ref 83.0–108.0)

## 2021-06-06 LAB — MAGNESIUM
Magnesium: 2.6 mg/dL — ABNORMAL HIGH (ref 1.7–2.4)
Magnesium: 2.4 mg/dL (ref 1.7–2.4)

## 2021-06-06 LAB — COMPREHENSIVE METABOLIC PANEL
ALT: 183 U/L — ABNORMAL HIGH (ref 0–44)
AST: 72 U/L — ABNORMAL HIGH (ref 15–41)
Albumin: 1.7 g/dL — ABNORMAL LOW (ref 3.5–5.0)
Alkaline Phosphatase: 80 U/L (ref 38–126)
Anion gap: 20 — ABNORMAL HIGH (ref 5–15)
BUN: 125 mg/dL — ABNORMAL HIGH (ref 8–23)
CO2: 20 mmol/L — ABNORMAL LOW (ref 22–32)
Calcium: 7.1 mg/dL — ABNORMAL LOW (ref 8.9–10.3)
Chloride: 90 mmol/L — ABNORMAL LOW (ref 98–111)
Creatinine, Ser: 8.8 mg/dL — ABNORMAL HIGH (ref 0.61–1.24)
GFR, Estimated: 6 mL/min — ABNORMAL LOW (ref >60)
Glucose, Bld: 382 mg/dL — ABNORMAL HIGH (ref 70–99)
Potassium: 4.3 mmol/L (ref 3.5–5.1)
Sodium: 130 mmol/L — ABNORMAL LOW (ref 135–145)
Total Bilirubin: 0.6 mg/dL (ref 0.3–1.2)
Total Protein: 5.5 g/dL — ABNORMAL LOW (ref 6.5–8.1)

## 2021-06-06 LAB — PHOSPHORUS
Phosphorus: 8.2 mg/dL — ABNORMAL HIGH (ref 2.5–4.6)
Phosphorus: 5.9 mg/dL — ABNORMAL HIGH (ref 2.5–4.6)

## 2021-06-06 LAB — LACTIC ACID, PLASMA: Lactic Acid, Venous: 1.3 mmol/L (ref 0.5–1.9)

## 2021-06-06 MED ORDER — INSULIN ASPART 100 UNIT/ML IJ SOLN
4.0000 [IU] | INTRAMUSCULAR | Status: DC
  Administered 2021-06-06 – 2021-06-07 (×6): 4 [IU] via SUBCUTANEOUS

## 2021-06-06 MED ORDER — ETOMIDATE 2 MG/ML IV SOLN
INTRAVENOUS | Status: AC
  Administered 2021-06-06: 20 mg via INTRAVENOUS
  Filled 2021-06-06: qty 20

## 2021-06-06 MED ORDER — ETOMIDATE 2 MG/ML IV SOLN: 20.0000 mg | Freq: Once | INTRAVENOUS | Status: AC

## 2021-06-06 MED ORDER — PHENYLEPHRINE HCL-NACL 20-0.9 MG/250ML-% IV SOLN
25.0000 ug/min | INTRAVENOUS | Status: DC
  Administered 2021-06-06: 25 ug/min via INTRAVENOUS
  Filled 2021-06-06: qty 250

## 2021-06-06 MED ORDER — PANTOPRAZOLE SODIUM 40 MG IV SOLR
40.0000 mg | Freq: Every day | INTRAVENOUS | Status: DC
  Administered 2021-06-06 – 2021-06-10 (×5): 40 mg via INTRAVENOUS
  Filled 2021-06-06 (×5): qty 40

## 2021-06-06 MED ORDER — INSULIN GLARGINE-YFGN 100 UNIT/ML ~~LOC~~ SOLN
15.0000 [IU] | Freq: Every day | SUBCUTANEOUS | Status: DC
  Administered 2021-06-06: 15 [IU] via SUBCUTANEOUS
  Filled 2021-06-06 (×2): qty 0.15

## 2021-06-06 MED ORDER — MIDAZOLAM HCL 2 MG/2ML IJ SOLN
INTRAMUSCULAR | Status: AC
  Administered 2021-06-06: 1 mg via INTRAVENOUS
  Filled 2021-06-06: qty 2

## 2021-06-06 MED ORDER — ROCURONIUM BROMIDE 50 MG/5ML IV SOLN
120.0000 mg | Freq: Once | INTRAVENOUS | Status: AC
  Filled 2021-06-06: qty 12

## 2021-06-06 MED ORDER — FENTANYL CITRATE PF 50 MCG/ML IJ SOSY: 25.0000 ug | PREFILLED_SYRINGE | Freq: Once | INTRAMUSCULAR | Status: DC

## 2021-06-06 MED ORDER — ROCURONIUM BROMIDE 10 MG/ML (PF) SYRINGE
PREFILLED_SYRINGE | INTRAVENOUS | Status: AC
  Administered 2021-06-06: 120 mg via INTRAVENOUS
  Filled 2021-06-06: qty 10

## 2021-06-06 MED ORDER — MIDODRINE HCL 5 MG PO TABS: 10.0000 mg | ORAL_TABLET | Freq: Three times a day (TID) | ORAL | Status: DC

## 2021-06-06 MED ORDER — LACTATED RINGERS IV BOLUS
250.0000 mL | Freq: Once | INTRAVENOUS | Status: AC
  Administered 2021-06-06: 250 mL via INTRAVENOUS

## 2021-06-06 MED ORDER — NOREPINEPHRINE 4 MG/250ML-% IV SOLN
2.0000 ug/min | INTRAVENOUS | Status: DC
  Administered 2021-06-06 – 2021-06-07 (×2): 2 ug/min via INTRAVENOUS
  Filled 2021-06-06 (×2): qty 250

## 2021-06-06 MED ORDER — CHLORHEXIDINE GLUCONATE CLOTH 2 % EX PADS
6.0000 | MEDICATED_PAD | Freq: Every day | CUTANEOUS | Status: DC
  Administered 2021-06-07 – 2021-06-09 (×2): 6 via TOPICAL

## 2021-06-06 MED ORDER — POLYETHYLENE GLYCOL 3350 17 G PO PACK
17.0000 g | PACK | Freq: Every day | ORAL | Status: DC
Start: 2021-06-06 — End: 2021-06-13
  Administered 2021-06-06 – 2021-06-12 (×4): 17 g
  Filled 2021-06-06 (×4): qty 1

## 2021-06-06 MED ORDER — FENTANYL BOLUS VIA INFUSION
25.0000 ug | INTRAVENOUS | Status: DC | PRN
  Administered 2021-06-08 (×2): 25 ug via INTRAVENOUS
  Filled 2021-06-06: qty 100

## 2021-06-06 MED ORDER — SUCCINYLCHOLINE CHLORIDE 200 MG/10ML IV SOSY
PREFILLED_SYRINGE | INTRAVENOUS | Status: AC
  Filled 2021-06-06: qty 10

## 2021-06-06 MED ORDER — DOCUSATE SODIUM 50 MG/5ML PO LIQD
100.0000 mg | Freq: Two times a day (BID) | ORAL | Status: DC
  Administered 2021-06-06 – 2021-06-12 (×7): 100 mg
  Filled 2021-06-06 (×10): qty 10

## 2021-06-06 MED ORDER — SODIUM CHLORIDE 0.9 % IV SOLN
250.0000 mL | INTRAVENOUS | Status: DC
  Administered 2021-06-06: 250 mL via INTRAVENOUS

## 2021-06-06 MED ORDER — MIDODRINE HCL 5 MG PO TABS
10.0000 mg | ORAL_TABLET | Freq: Three times a day (TID) | ORAL | Status: DC
  Administered 2021-06-06 – 2021-06-07 (×3): 10 mg via ORAL
  Filled 2021-06-06 (×5): qty 2

## 2021-06-06 MED ORDER — KETAMINE HCL 50 MG/5ML IJ SOSY
PREFILLED_SYRINGE | INTRAMUSCULAR | Status: AC
  Filled 2021-06-06: qty 5

## 2021-06-06 MED ORDER — IPRATROPIUM-ALBUTEROL 0.5-2.5 (3) MG/3ML IN SOLN
3.0000 mL | Freq: Four times a day (QID) | RESPIRATORY_TRACT | Status: DC
  Administered 2021-06-06 – 2021-06-09 (×13): 3 mL via RESPIRATORY_TRACT
  Filled 2021-06-06 (×13): qty 3

## 2021-06-06 MED ORDER — FENTANYL 2500MCG IN NS 250ML (10MCG/ML) PREMIX INFUSION
25.0000 ug/h | INTRAVENOUS | Status: DC
  Administered 2021-06-06: 50 ug/h via INTRAVENOUS
  Administered 2021-06-07 – 2021-06-09 (×4): 150 ug/h via INTRAVENOUS
  Filled 2021-06-06 (×5): qty 250

## 2021-06-06 MED ORDER — MIDAZOLAM HCL 2 MG/2ML IJ SOLN: 1.0000 mg | Freq: Once | INTRAMUSCULAR | Status: AC

## 2021-06-06 MED ORDER — FENTANYL CITRATE PF 50 MCG/ML IJ SOSY
PREFILLED_SYRINGE | INTRAMUSCULAR | Status: AC
  Administered 2021-06-06: 100 ug via INTRAVENOUS
  Filled 2021-06-06: qty 2

## 2021-06-06 MED ORDER — FENTANYL CITRATE PF 50 MCG/ML IJ SOSY: 100.0000 ug | PREFILLED_SYRINGE | Freq: Once | INTRAMUSCULAR | Status: AC

## 2021-06-06 MED ORDER — CEFAZOLIN SODIUM-DEXTROSE 1-4 GM/50ML-% IV SOLN
1.0000 g | INTRAVENOUS | Status: DC
  Administered 2021-06-06 – 2021-06-13 (×7): 1 g via INTRAVENOUS
  Filled 2021-06-06 (×10): qty 50

## 2021-06-06 MED ORDER — ROCURONIUM BROMIDE 10 MG/ML (PF) SYRINGE
PREFILLED_SYRINGE | INTRAVENOUS | Status: AC
  Filled 2021-06-06: qty 10

## 2021-06-06 NOTE — TOC Progression Note (Signed)
Transition of Care [facility]) - Progression Note    Patient Details  Name: Richard Hall MRN: [id number] Date of Birth: [birth year]  Transition of Care [facility]) CM/SW [place], [place] Phone Number: 06/06/2021, 5:56 PM  Clinical Narrative:     CSW continues to follow patient and will fax out initial referral for SNF placement when appropriate. Patient has cortrak. CSW will continue to follow and assist with patients dc planning needs.  Expected Discharge Plan: Skilled Nursing Facility Barriers to Discharge: Continued Medical Work up  Expected Discharge Plan and Services Expected Discharge Plan: [place] In-house Referral: Clinical Social Work     Living arrangements for the past 2 months: Single Family Home                                       Social Determinants of Health (SDOH) Interventions    Readmission Risk Interventions Readmission Risk Prevention Plan 01/12/2019  Transportation Screening Complete  PCP or Specialist Appt within 3-5 Days Complete  HRI or [place] Complete  Social Work Consult for [place] Planning/Counseling Complete  Palliative Care Screening Not Applicable  Medication Review Press photographer) Complete  Some recent data might be hidden

## 2021-06-06 NOTE — Progress Notes (Signed)
PCCM interval progress note:   Pt with continued AMS, slightly wakes up and then back to sleep. Repeat ABG ok but concern for aspiration risk and decompensation, so the decision was made to intubate.  Discussed with patient's son who was in agreement with the plan.   Otilio Carpen Tory Mckissack, PA-C

## 2021-06-06 NOTE — Progress Notes (Addendum)
[facility] Progress Note  Name: Richard Hall MRN: [id number] DOB: April 23, 1956  Chief Complaint:  Shortness of breath   Subjective:  last HD 1/3 early am with 3 kg UF.  Per nursing they are going to start him on a little neo.  Blood pressure has been lower.  Heart rate 100's overnight then improved to 70's/80's.  He has been on amio gtt.  He was more somnolent at the start of RN shift and improved with BIPAP per her assessment.  BP high 80's to 90's on screen.  HR 70's to 80's.  Review of systems:  Reports shortness of breath is better  Denies n/v No chest pain    Intake/Output Summary (Last 24 hours) at 06/06/2021 0635 Last data filed at 06/06/2021 0000 Gross per 24 hour  Intake 1306.6 ml  Output --  Net 1306.6 ml    Vitals:  Vitals:   06/05/21 2300 06/06/21 0000 06/06/21 0150 06/06/21 0400  BP: 103/65   95/60  Pulse: (!) 108   76  Resp: (!) 25   15  Temp:  (!) 102.5 F (39.2 C) (!) 100.6 F (38.1 C) 98.3 F (36.8 C)  TempSrc:  Axillary Axillary Axillary  SpO2: 94%   98%  Weight:      Height:         Physical Exam:   General adult male in bed in NAD at rest on 3 liters oxygen  HEENT normocephalic atraumatic Neck supple trachea midline Lungs clear but reduced  Heart S1S2 no rub Abdomen soft nontender nondistended/obese habitus Extremities no edema lower extremities Neuro oriented times three - gives answers more quickly today. improved  Access LUE AVF bruit and thrill   Medications reviewed   Labs:  BMP Latest Ref Rng & Units 06/05/2021 06/05/2021 06/04/2021  Glucose 70 - 99 mg/dL - 161(H) 129(H)  BUN 8 - 23 mg/dL - 77(H) 62(H)  Creatinine 0.61 - 1.24 mg/dL - 6.58(H) 5.83(H)  Sodium 135 - 145 mmol/L 131(L) 132(L) 132(L)  Potassium 3.5 - 5.1 mmol/L 4.4 4.5 4.4  Chloride 98 - 111 mmol/L - 93(L) 93(L)  CO2 22 - 32 mmol/L - 22 24  Calcium 8.9 - 10.3 mg/dL - 7.5(L) 7.4(L)   Outpatient dialysis orders:  [place] MWF Outpt prescription: F180, BFR  450, 4hrs, 2 K, 2 Ca, EDW 123kg AVF Meds - mircera 225 mcg every 2 weeks - last received 200 mcg on 05/15/21 and was ordered to increase venofer 100 mg (ordered for 10 doses) Act vit D not ordered  Assessment/Plan:   # ESRD:   - Left below his EDW at 121kg last outpatient tx.  It was felt most of his hypoxia was related to covid.  Expect weight loss with covid though.  Usually HD per MWF schedule - got HD very early am on 1/3.  - HD today off schedule and then on Sat. Limited UF. Next week will be MWF schedule again   - labs pending - challenging his EDW as tolerated  - stopped IV lasix    # Encephalopathy  - unclear etiology  - work-up per primary team - optimize clearance with HD   # Volume/ hypertension: EDW 123kg. But left below EDW at 121 last outpatient tx on 12/30.  Weights here do not appear charted correctly (118 kg charted as pre-HD weight) - perhaps is post as 3 kg was removed.  Bed broken most recently    # Anemia of Chronic Kidney Disease: Ferritin 5300 so defer  iron. Resume ESA - aranesp 200 mcg weekly on wed. He got on 1/4   # Secondary Hyperparathyroidism/Hyperphosphatemia: hyperphos. on [place] and HD. Increased to 2.5 calcium bath here   #Acute hypoxic respiratory failure secondary to COVID-pneumonia: Treatment per primary team. On dexamethasone. S/p remdesivir    #Atrial Fibrillation w/ RVR: Management per primary team. On amio   #Hyperkalemia: resolved with HD.    Disposition -  in ICU  Claudia Desanctis, MD 06/06/2021 6:54 AM

## 2021-06-06 NOTE — Progress Notes (Signed)
An USGPIV (ultrasound guided PIV) has been placed for short-term vasopressor infusion. A correctly placed ivWatch must be used when administering Vasopressors. Should this treatment be needed beyond 72 hours, central line access should be obtained.  It will be the responsibility of the bedside nurse to follow best practice to prevent extravasations.   ?

## 2021-06-06 NOTE — Progress Notes (Signed)
eLink Physician-Brief Progress Note Patient Name: Richard Hall DOB: 01-Jun-1956 MRN: [id number]   Date of Service  06/06/2021  HPI/Events of Note  Hypotension - BP = 83/84 with MAP = 64.   eICU Interventions  Plan: Decrease Amiodarone IV infusion to 0.5 mg/min. Phenylephrine IV infusion via PIV. Titrate to MAP >= 65.     Intervention Category Major Interventions: Other:  Johanne Mcglade Cornelia Copa 06/06/2021, 6:14 AM

## 2021-06-06 NOTE — Procedures (Signed)
Intubation Procedure Note  Richard Hall  [id number]  [birth year]  Date:06/06/21  Time:3:41 PM   Provider Performing:Amaury Kuzel Rodman Pickle, MD   Procedure: Intubation (08138)  Indication(s) Acute metabolic encephalopathy Airway protection pending procedure  Consent Risks of the procedure as well as the alternatives and risks of each were explained to the patient and/or caregiver.  Consent for the procedure was obtained and is signed in the bedside chart   Anesthesia Etomidate, Versed, Fentanyl, and Rocuronium   Time Out Verified patient identification, verified procedure, site/side was marked, verified correct patient position, special equipment/implants available, medications/allergies/relevant history reviewed, required imaging and test results available.   Sterile Technique Usual hand hygeine, masks, and gloves were used   Procedure Description Patient positioned in bed supine.  Sedation given as noted above.  Patient was intubated with endotracheal tube using Glidescope.  View was Grade 1 full glottis .  Number of attempts was 1.  Colorimetric CO2 detector was consistent with tracheal placement.   Complications/Tolerance None; patient tolerated the procedure well. Chest X-ray is ordered to verify placement.   EBL Minimal   Specimen(s) None

## 2021-06-06 NOTE — Consult Note (Signed)
[name] for Infectious Disease    Date of Admission:  05/31/2021   Total days of inpatient antibiotics 1        Reason for Consult: MSSA bacteremia    Principal Problem:   Atrial fibrillation with RVR ([place]) Active Problems:   OSA on CPAP   Hypothyroidism (acquired)   Essential hypertension   Dyslipidemia   Diabetes mellitus with end-stage renal disease (HCC)   ESRD (end stage renal disease) on dialysis ([place])   Atrial fibrillation (HCC)   Personal history of transient ischemic attack (TIA), and cerebral infarction without residual deficits   Acute on chronic respiratory failure with hypoxia (HCC)   CHF exacerbation ([place])   COVID-19 virus infection   Elevated troponin   Chronic obstructive pulmonary disease with acute lower respiratory infection (HCC)   MSSA bacteremia   Encephalopathy acute   Assessment: 66 YM with ESRD on iHD via left arm fistula, 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 Found to be in Afib with rvr (Hx of Afib SP watchman), CHF exacerbation, COVID infection. Remdesivir was started, but stopped due to elevated LFTs. Hospital course complicated by fever, found to have MSSA bacteremia. ID engaged.   # MSSA bacteremia, suspect hospital associated # Fever #Left knee pain -Etiology of bacteremia is unclear, old right hand IV site does not look infected. Pt's respiratory status around baseline(he is on 2L at home and 3L now) as such I do not think his bacteremia  2/2 MSSA pneumonia. In addition, CXR showed small patchy infiltrates in right perihilar and right lower lung fields. If respiratory status worsens then we can consider CT. Other sources could be dental as he has poor dentition. He also has a tender left knee in the setting of ORIF distal femur on 05/20/19. -TTE on 12/31 showed moderate mitral valve leaflet thickening with severe MR. Cardiology had been following the patient during hospitalization,  recommended TEE outpatient once pt recovered form COVID. Given that pt has now found to have MSSA bacteremia would obtain TEE to evaluate for endocarditis as it would effect management/treatment duration.  Recommendations:  -D/C vancomycin and cefepime, start ancef with iHD -Unable to do orthopantogram, Ordred CT instead -Xray right knee -Repeat blood Cx -TEE   #COVID infection #CHF exacerbation #Acute on chronic respiratory failure-improved -Agree on holding remdesivir. Pt's respiratory status  has improved and  almost at baseline.   Microbiology:   Antibiotics: Vancomycin and cefepime 1/04-p Remdesivir 12/30-31   Cultures: Blood 06/05/21 2/2 MSSA    HPI: Richard Hall is a 66 y.o. male with medical history significant of anemia, Afib, CHF, ESRD on iHD, prostate cancer with mets to sacrum and spine, HLD, HTN, OSA, on 2L O2 at baseline, CVA admitted for acute respiratory failure 2/2 CHF exacerbation. Pt fount to be in Afiv with RVR and COVID positive. He presented form dialysis where he was [facility]. According to his son, pt had respiratory symptoms and congestion started about  about 4 days prior to admission. Initially placed on Bipap, now on 3L [place]. He received about 2 days of remdesivir but stopped due to elevated AST/ALT, dexamethasone continued. TTE showed severe mitral regurgitation, Cardiology engaged and recommended TEE once pt recovers from [place]. During hospitalization, pt fevered. Blood Cx + MSSA. ID engaged due to bacteremia.  Today, pt is resting in bed. He is unable to provide a good history. He does report that his left knee hurts.  Unclear how long his knee pain has been going on.     Review of Systems: ROS  Past Medical History:  Diagnosis Date   A-V fistula ([place])    left arm   Anemia    Arthritis    CHF (congestive heart failure) (HCC)    CVA (cerebral vascular accident) ([place]) 2010   Diabetes mellitus with end-stage renal disease ([place])    Dyalisis pt.    Diabetic neuropathy (HCC)    Bilateral feet   Dyslipidemia    Dysrhythmia    A-fib   Essential hypertension    Hepatitis    childhood   History of blood transfusion    Hypothyroidism (acquired)    OSA on CPAP    Prostate cancer (HCC)    Uses wheelchair     Social History   Tobacco Use   Smoking status: Former    Types: Cigarettes    Quit date: 1997    Years since quitting: 26.0   Smokeless tobacco: Never  Vaping Use   Vaping Use: Never used  Substance Use Topics   Alcohol use: Never   Drug use: Never    Family History  Problem Relation Age of Onset   Cancer Mother    Scheduled Meds:  apixaban  5 mg Oral BID   chlorhexidine  15 mL Mouth Rinse BID   Chlorhexidine Gluconate Cloth  6 each Topical Q0600   Chlorhexidine Gluconate Cloth  6 each Topical Q0600   [START ON 06/12/2021] darbepoetin (ARANESP) injection - DIALYSIS  200 mcg Intravenous Q Wed-HD   dexamethasone (DECADRON) injection  4 mg Intravenous Q24H   feeding supplement (NEPRO CARB STEADY)  1,000 mL Per Tube BID BM   feeding supplement (PROSource TF)  45 mL Per Tube TID   insulin aspart  0-6 Units Subcutaneous Q4H   [START ON 06/08/2021] levothyroxine  50 mcg Intravenous Daily   mouth rinse  15 mL Mouth Rinse q12n4p   sodium chloride flush  3 mL Intravenous Q12H   Continuous Infusions:  sodium chloride 10 mL/hr at 06/06/21 1000   amiodarone 30 mg/hr (06/06/21 1000)    ceFAZolin (ANCEF) IV     norepinephrine (LEVOPHED) Adult infusion 4 mcg/min (06/06/21 1000)   PRN Meds:.acetaminophen **OR** acetaminophen, metoprolol tartrate Allergies  Allergen Reactions   Gabapentin Swelling    OBJECTIVE: Blood pressure (!) 97/58, pulse 82, temperature (!) 97.4 F (36.3 C), temperature source Oral, resp. rate (!) 24, height 6' (1.829 m), weight 118 kg, SpO2 98 %.  Physical Exam Constitutional:      General: He is not in acute distress.    Appearance: He is normal weight. He is not toxic-appearing.  HENT:      Head: Normocephalic and atraumatic.     Right Ear: External ear normal.     Left Ear: External ear normal.     Nose: No congestion or rhinorrhea.     Mouth/Throat:     Mouth: Mucous membranes are moist.     Pharynx: Oropharynx is clear.  Eyes:     Extraocular Movements: Extraocular movements intact.     Conjunctiva/sclera: Conjunctivae normal.     Pupils: Pupils are equal, round, and reactive to light.  Cardiovascular:     Rate and Rhythm: Normal rate and regular rhythm.     Heart sounds: No murmur heard.   No friction rub. No gallop.  Pulmonary:     Effort: Pulmonary effort is normal.     Breath sounds: Normal breath sounds.  Abdominal:     General: Abdomen is flat. Bowel sounds are normal.     Palpations: Abdomen is soft.  Musculoskeletal:        General: No swelling. Normal range of motion.     Cervical back: Normal range of motion and neck supple.  Skin:    General: Skin is warm and dry.     Comments: Left arm AVF , thrillintact Previous R PIV site, no erythema or tenderness.   Neurological:     General: No focal deficit present.     Mental Status: He is oriented to person, place, and time.  Psychiatric:        Mood and Affect: Mood normal.    Lab Results Lab Results  Component Value Date   WBC 10.0 06/06/2021   HGB 7.4 (L) 06/06/2021   HCT 23.7 (L) 06/06/2021   MCV 92.6 06/06/2021   PLT 229 06/06/2021    Lab Results  Component Value Date   CREATININE 8.80 (H) 06/06/2021   BUN 125 (H) 06/06/2021   NA 130 (L) 06/06/2021   K 4.3 06/06/2021   CL 90 (L) 06/06/2021   CO2 20 (L) 06/06/2021    Lab Results  Component Value Date   ALT 183 (H) 06/06/2021   AST 72 (H) 06/06/2021   ALKPHOS 80 06/06/2021   BILITOT 0.6 06/06/2021       Laurice Record, [place] for Infectious Disease [facility] 06/06/2021, 10:32 AM

## 2021-06-06 NOTE — Progress Notes (Signed)
Inpatient Diabetes Program Recommendations  AACE/ADA: New Consensus Statement on Inpatient Glycemic Control (2015)  Target Ranges:  Prepandial:   less than 140 mg/dL      Peak postprandial:   less than 180 mg/dL (1-2 hours)      Critically ill patients:  140 - 180 mg/dL   Lab Results  Component Value Date   GLUCAP 338 (H) 06/06/2021   HGBA1C 6.1 (H) 06/01/2021    Review of Glycemic Control  Latest Reference Range & Units 06/05/21 23:10 06/06/21 00:19 06/06/21 03:56 06/06/21 08:54 06/06/21 11:38  Glucose-Capillary 70 - 99 mg/dL 317 (H) 329 (H) 281 (H) 307 (H) 338 (H)   Diabetes history: DM 2 Outpatient Diabetes medications: Toujeo 10 units daily (patient reports using 30 units of Toujeo daily) Current orders for Inpatient glycemic control:  Novolog 0-6 units q 4 hours Decadron 4 mg daily Nepro 50 ml/hr Novolog 4 units q 4 hours  Inpatient Diabetes Program Recommendations:    Consider adding Semglee 15 units daily.   Thanks,  Adah Perl, RN, BC-ADM Inpatient Diabetes Coordinator Pager [phone]  (8a-5p)

## 2021-06-06 NOTE — Progress Notes (Addendum)
NAME:  Richard Hall, MRN:  [id number], DOB:  Oct 23, 1955, LOS: 5 ADMISSION DATE:  05/31/2021, CONSULTATION DATE: 06/04/2021 REFERRING MD: [facility], CHIEF COMPLAINT: Altered mental status  History of Present Illness:  66 year old male with a plethora of health issues was admitted on 05/31/2021 shortness of breath was found to be COVID-19 positive.  He is end-stage renal disease underwent dialysis on 06/04/2020 and was a -2 L.  He had decreased level of consciousness on 06/04/2021 and there was concern for hypercarbia but his PCO2 was 46 on ABGs therefore another source of confusion and altered mental status is being evaluated.  He is requiring noninvasive mechanical ventilatory support at this time.  He is going to CT scan for further evaluation of altered mental status to be transferred to intensive care unit first to be stabilized and then go to CT scan.  He has an extensive past medical history is well-documented below.  Pertinent  Medical History   Past Medical History:  Diagnosis Date   A-V fistula (HCC)    left arm   Anemia    Arthritis    CHF (congestive heart failure) (HCC)    CVA (cerebral vascular accident) ([place]) 2010   Diabetes mellitus with end-stage renal disease ([place])    Dyalisis pt.   Diabetic neuropathy (HCC)    Bilateral feet   Dyslipidemia    Dysrhythmia    A-fib   Essential hypertension    Hepatitis    childhood   History of blood transfusion    Hypothyroidism (acquired)    OSA on CPAP    Prostate cancer [facility])    Uses wheelchair      Significant Hospital Events: Including procedures, antibiotic start and stop dates in addition to other pertinent events   06/04/2021 transferred intensive care unit for altered mental status 1/4 tolerated Bipap overnight, remains somewhat lethargic on nasal cannula, though protecting his airway  Studies 1/3 CTH>No acute intracranial hemorrhage or definite evidence of acute infarction. There are few small vessel infarcts of the basal  ganglia bilaterally, but at least some were present previously.   Micro 1/4 Bcx2> +MSSA 12/30 Covid + 1/4 RVP>negative   Abx Vancomycin 1/4 Cefepime 1/4 Cefazolin 1/5-  Interim History / Subjective:  Wore Bipap most of the night, was started on low dose Neo  Mental status about the same this AM  Objective   Blood pressure 98/64, pulse 83, temperature 98.3 F (36.8 C), temperature source Axillary, resp. rate (!) 25, height 6' (1.829 m), weight 118 kg, SpO2 94 %.    Vent Mode: PCV;BIPAP FiO2 (%):  [30 %] 30 % Set Rate:  [12 bmp] 12 bmp Pressure Support:  [6 cmH20] 6 cmH20   Intake/Output Summary (Last 24 hours) at 06/06/2021 0901 Last data filed at 06/06/2021 0859 Gross per 24 hour  Intake 1541.82 ml  Output --  Net 1541.82 ml    Filed Weights   06/02/21 0500 06/04/21 0306 06/04/21 0806  Weight: 120.6 kg 121.5 kg 118 kg      General:  obese, elderly M, sleeping in bed in no acute distress HEENT: MM pink/moist, pupils equal and sclera anicteric Neuro: sleeping, wakes up to voice and mumbles responses, slowly follows commands then back to sleep CV: s1s2 rrr, no m/r/g PULM:  minimal rhonchi and expiratory wheezing bilaterally, no tachypnea or accessory muscle use, does not appear dyspneic  GI: soft, bsx4 active  Extremities: warm/dry, 2+ edema  Skin: no rashes or lesions   WBC 10k Hgb 7.4  Na 130 K 4.3  Resolved Hospital Problem list     Assessment & Plan:     Acute Altered mental status in the setting of COVID-19 infection,  ESRD stage renal disease and MSSA Bacteremia and possibly developing septic shock Dialysis T/TH/Sat Transferred to intensive care for monitoring, possible intubation Head CT with small basal ganglia infarcts, spoke with radiology, perhaps one small infarct was not present on prior imaging, but do not think would be causing clinical acute encephalopathy -pt is still protecting his airway today, but remains encephalopathic -suspect AMS  is secondary to bacteremia, repeat ABG yesterday without hypercapnia  -place cortrak placed -continue to monitor in ICU, remains an intubation risk -Cefazolin for MSSA bacteremia, ID following  -started on low dose neo overnight, HR controlled so change to peripheral Levophed, lactic acid ok     Hypoxic Respiratory Failure secondary to Covid-19 pneumonia Baseline OSA Home BiPAP at 30% FiO2 with sats at 96%, but spoke with son who says that he is not using Bipap regularly  -remains on 2L and Bipap qhs -continue Dexamethasone for 10 day course -extended RVP negative -some wheezing today, start scheduled nebs -continue to monitor closely      Atrial fibrillation with rapid ventricular response Mitral valve calcification/thickening on echo, concern for severe MR On amiodarone gtt EF 60-65% -now has cortrak, resume home metoprolol and off amiodarone as BP will allow -continue Eliquis -consider TEE    Liver enzymes elevated -improving, continue to monitor     End-stage renal disease Last dialysis 06/04/2020 with -2 L -HD per nephrology, plan for today, however may not tolerate with borderline hypotension -will d/c amiodarone and see we can come off peripheral levophed  Hypothyroidism -continue Synthroid   Diabetes mellitus -continue SSI -hyperglycemic today, add TF coverage   Best Practice (right click and "Reselect all SmartList Selections" daily)   Diet/type: tubefeeds DVT prophylaxis: DOAC GI prophylaxis: PPI Lines: N/A Foley:  N/A Code Status:  full code Last date of multidisciplinary goals of care discussion [Family update pending 1/5]  Labs   CBC: Recent Labs  Lab 05/31/21 1700 05/31/21 1717 06/03/21 0358 06/04/21 0620 06/05/21 0109 06/05/21 1214 06/06/21 0606 06/06/21 0748  WBC 12.1*   < > 14.9* 17.9* 17.5*  --  8.9 10.0  NEUTROABS 10.1*  --   --   --   --   --   --   --   HGB 8.7*   < > 7.3* 7.5* 7.7* 10.9* 6.9* 7.4*  HCT 27.2*   < > 23.3*  23.1* 24.4* 32.0* 22.0* 23.7*  MCV 91.0   < > 91.4 88.5 92.1  --  94.8 92.6  PLT 269   < > 276 312 291  --  215 229   < > = values in this interval not displayed.     Basic Metabolic Panel: Recent Labs  Lab 06/03/21 0358 06/04/21 0620 06/04/21 1432 06/05/21 0109 06/05/21 1214 06/05/21 1502 06/05/21 1646 06/06/21 0606  NA 127* 133* 132* 132* 131*  --   --  130*  K 5.5* 3.0* 4.4 4.5 4.4  --   --  4.3  CL 87* 93* 93* 93*  --   --   --  90*  CO2 17* 24 24 22   --   --   --  20*  GLUCOSE 184* 100* 129* 161*  --   --   --  382*  BUN 142* 60* 62* 77*  --   --   --  125*  CREATININE 9.07* 4.40* 5.83* 6.58*  --   --   --  8.80*  CALCIUM 7.1* 7.4* 7.4* 7.5*  --   --   --  7.1*  MG 2.8* 2.1  --  2.3  --  2.3 2.3 2.6*  PHOS >30.0* 4.8*  --  7.4*  --  7.4* 6.9* 8.2*    GFR: Estimated Creatinine Clearance: 11 mL/min (A) (by C-G formula based on SCr of 8.8 mg/dL (H)). Recent Labs  Lab 06/04/21 0620 06/05/21 0109 06/06/21 0606 06/06/21 0748  WBC 17.9* 17.5* 8.9 10.0  LATICACIDVEN  --   --  1.3  --      Liver Function Tests: Recent Labs  Lab 06/02/21 0214 06/03/21 0358 06/04/21 0620 06/04/21 1432 06/06/21 0606  AST 1,365* 601* 464* 345* 72*  ALT 618* 494* 466* 372* 183*  ALKPHOS 119 111 113 104 80  BILITOT 0.8 1.2 0.7 0.7 0.6  PROT 6.8 6.5 6.7 6.5 5.5*  ALBUMIN 2.6* 2.6* 2.5* 2.2* 1.7*    No results for input(s): LIPASE, AMYLASE in the last 168 hours. Recent Labs  Lab 06/04/21 1040  AMMONIA 30     ABG    Component Value Date/Time   PHART 7.396 06/05/2021 1214   PCO2ART 39.9 06/05/2021 1214   PO2ART 81 (L) 06/05/2021 1214   HCO3 24.4 06/05/2021 1214   TCO2 26 06/05/2021 1214   ACIDBASEDEF 2.0 01/07/2019 1618   O2SAT 96.0 06/05/2021 1214      Coagulation Profile: No results for input(s): INR, PROTIME in the last 168 hours.  Cardiac Enzymes: No results for input(s): CKTOTAL, CKMB, CKMBINDEX, TROPONINI in the last 168 hours.  HbA1C: Hgb A1c MFr Bld   Date/Time Value Ref Range Status  06/01/2021 02:32 AM 6.1 (H) 4.8 - 5.6 % Final    Comment:    (NOTE) Pre diabetes:          5.7%-6.4%  Diabetes:              >6.4%  Glycemic control for   <7.0% adults with diabetes   07/26/2020 10:45 AM 10.1 (H) 4.8 - 5.6 % Final    Comment:    (NOTE) Pre diabetes:          5.7%-6.4%  Diabetes:              >6.4%  Glycemic control for   <7.0% adults with diabetes     CBG: Recent Labs  Lab 06/05/21 2038 06/05/21 2310 06/06/21 0019 06/06/21 0356 06/06/21 0854  GLUCAP 254* 317* 329* 281* 307*     Review of Systems:   NA  Past Medical History:  He,  has a past medical history of A-V fistula ([place]), Anemia, Arthritis, CHF (congestive heart failure) ([place]), CVA (cerebral vascular accident) ([place]) (2010), Diabetes mellitus with end-stage renal disease ([place]), Diabetic neuropathy ([place]), Dyslipidemia, Dysrhythmia, Essential hypertension, Hepatitis, History of blood transfusion, Hypothyroidism (acquired), OSA on CPAP, Prostate cancer ([place]), and Uses wheelchair.   Surgical History:   Past Surgical History:  Procedure Laterality Date   APPENDECTOMY     COLONOSCOPY     FEMUR IM NAIL Left 05/20/2019   Procedure: Intramedullary (Im) Retrograde Femoral Nailing for impending pathologic fracture;  Surgeon: Altamese Mercer, MD;  Location: [facility];  Service: Orthopedics;  Laterality: Left;   ORIF FEMUR FRACTURE Left 05/20/2019   Procedure: OPEN REDUCTION INTERNAL FIXATION (ORIF) DISTAL FEMUR FRACTURE with intercondylar extension;  Surgeon: Altamese [place], MD;  Location: [facility];  Service: Orthopedics;  Laterality:  Left;   TRANSURETHRAL RESECTION OF BLADDER TUMOR N/A 01/07/2019   Procedure: CYSTOSCOPY/ EVEACUATION CLOT;  Surgeon: Bjorn Loser, MD;  Location: [facility];  Service: Urology;  Laterality: N/A;   TRANSURETHRAL RESECTION OF PROSTATE N/A 07/26/2020   Procedure: TRANSURETHRAL RESECTION OF THE PROSTATE (TURP);  Surgeon: Alexis Frock, MD;   Location: [facility];  Service: Urology;  Laterality: N/A;  62 MINS     Social History:   reports that he quit smoking about 26 years ago. He has never used smokeless tobacco. He reports that he does not drink alcohol and does not use drugs.   Family History:  His family history includes Cancer in his mother.   Allergies Allergies  Allergen Reactions   Gabapentin Swelling     Home Medications  Prior to Admission medications   Medication Sig Start Date End Date Taking? Authorizing Provider  acetaminophen (TYLENOL) 500 MG tablet Take 500-1,000 mg by mouth every 6 (six) hours as needed (for pain.).   Yes [provider]  amitriptyline (ELAVIL) 100 MG tablet Take 1 tablet (100 mg total) by mouth at bedtime. 05/25/19  Yes Christian, Rylee, MD  atorvastatin (LIPITOR) 80 MG tablet Take 80 mg by mouth daily. 03/08/20  Yes [provider]  diphenoxylate-atropine (LOMOTIL) 2.5-0.025 MG tablet Take 1 tablet by mouth 4 (four) times daily as needed for diarrhea or loose stools. 04/23/21  Yes [provider]  ELIQUIS 2.5 MG TABS tablet Take 2.5 mg by mouth 2 (two) times daily. 04/22/21  Yes [provider]  EUTHYROX 88 MCG tablet Take 88 mcg by mouth daily before breakfast. 02/06/20  Yes [provider]  furosemide (LASIX) 80 MG tablet Take 1 tablet (80 mg total) by mouth 4 (four) times a week. Mondays, wednesdays, fridays, and sundays Patient taking differently: Take 80 mg by mouth See admin instructions. Mondays, Wednesdays, and Saturdays 05/26/19  Yes Christian, Rylee, MD  insulin glargine, 1 Unit Dial, (TOUJEO SOLOSTAR) 300 UNIT/ML Solostar Pen Inject 24 Units into the skin at bedtime.   Yes [provider]  LEUPROLIDE ACETATE, 6 MONTH, 45 MG injection Inject 45 mg into the skin every 6 (six) months. 05/25/19  Yes Christian, Rylee, MD  levothyroxine (SYNTHROID) 100 MCG tablet Take 100 mcg by mouth daily. 05/21/21  Yes [provider]   lidocaine-prilocaine (EMLA) cream Apply 1 application topically See admin instructions. Apply small amount to access site 1-2 hours before dialysis, cover with occlusive dressing (saran wrap) 05/25/19  Yes Christian, Rylee, MD  metoprolol tartrate (LOPRESSOR) 25 MG tablet Take 25 mg by mouth 2 (two) times daily. 02/06/20  Yes [provider]  sertraline (ZOLOFT) 100 MG tablet Take 100 mg by mouth daily. 02/06/20  Yes [provider]  XTANDI 40 MG capsule Take 160 mg by mouth at bedtime. 03/12/20  Yes [provider]  ferric citrate (AURYXIA) 1 GM 210 MG(Fe) tablet Take 2 tablets (420 mg total) by mouth 3 (three) times daily with meals. Patient not taking: Reported on 06/01/2021 05/25/19   Mitzi Hansen, MD  fluticasone [facility] ALLERGY RELIEF) 50 MCG/ACT nasal spray Place 1 spray into both nostrils as needed. Patient not taking: Reported on 06/01/2021 05/25/19   Mitzi Hansen, MD  glipiZIDE (GLUCOTROL) 10 MG tablet Take 5 mg by mouth daily. Patient not taking: Reported on 06/01/2021 02/06/20   [provider]  LINZESS 72 MCG capsule Take 1 capsule (72 mcg total) by mouth as needed. Patient not taking: Reported on 06/01/2021 05/25/19   Darrick Meigs,  Rylee, MD  traMADol (ULTRAM) 50 MG tablet Take 1-2 tablets (50-100 mg total) by mouth every 6 (six) hours as needed for moderate pain or severe pain. Post-operatively Patient not taking: Reported on 06/01/2021 07/27/20 07/27/21  Alexis Frock, MD     Critical care time: 36 min     CRITICAL CARE Performed by: Otilio Carpen Shahmeer Bunn   Total critical care time: 36 minutes  Critical care time was exclusive of separately billable procedures and treating other patients.  Critical care was necessary to treat or prevent imminent or life-threatening deterioration.  Critical care was time spent personally by me on the following activities: development of treatment plan with patient and/or surrogate as well as nursing,  discussions with consultants, evaluation of patient's response to treatment, examination of patient, obtaining history from patient or surrogate, ordering and performing treatments and interventions, ordering and review of laboratory studies, ordering and review of radiographic studies, pulse oximetry and re-evaluation of patient's condition.    Otilio Carpen Eviana Sibilia, PA-C [facility] See Amion for pager If no response to pager , please call [phone] until 7pm After 7:00 pm call Elink  [phone]

## 2021-06-07 DIAGNOSIS — I4891 Unspecified atrial fibrillation: Secondary | ICD-10-CM | POA: Diagnosis not present

## 2021-06-07 DIAGNOSIS — J9690 Respiratory failure, unspecified, unspecified whether with hypoxia or hypercapnia: Secondary | ICD-10-CM

## 2021-06-07 LAB — BASIC METABOLIC PANEL
Anion gap: 15 (ref 5–15)
BUN: 78 mg/dL — ABNORMAL HIGH (ref 8–23)
CO2: 23 mmol/L (ref 22–32)
Calcium: 7.9 mg/dL — ABNORMAL LOW (ref 8.9–10.3)
Chloride: 95 mmol/L — ABNORMAL LOW (ref 98–111)
Creatinine, Ser: 5.6 mg/dL — ABNORMAL HIGH (ref 0.61–1.24)
GFR, Estimated: 11 mL/min — ABNORMAL LOW (ref >60)
Glucose, Bld: 302 mg/dL — ABNORMAL HIGH (ref 70–99)
Potassium: 4.1 mmol/L (ref 3.5–5.1)
Sodium: 133 mmol/L — ABNORMAL LOW (ref 135–145)

## 2021-06-07 LAB — CBC
HCT: 21.6 % — ABNORMAL LOW (ref 39.0–52.0)
Hemoglobin: 7 g/dL — ABNORMAL LOW (ref 13.0–17.0)
MCH: 29.4 pg (ref 26.0–34.0)
MCHC: 32.4 g/dL (ref 30.0–36.0)
MCV: 90.8 fL (ref 80.0–100.0)
Platelets: 217 10*3/uL (ref 150–400)
RBC: 2.38 MIL/uL — ABNORMAL LOW (ref 4.22–5.81)
RDW: 18.7 % — ABNORMAL HIGH (ref 11.5–15.5)
WBC: 9.4 10*3/uL (ref 4.0–10.5)
nRBC: 1.5 % — ABNORMAL HIGH (ref 0.0–0.2)

## 2021-06-07 LAB — CULTURE, BLOOD (ROUTINE X 2)
Special Requests: ADEQUATE
Special Requests: ADEQUATE

## 2021-06-07 LAB — PHOSPHORUS: Phosphorus: 5.2 mg/dL — ABNORMAL HIGH (ref 2.5–4.6)

## 2021-06-07 LAB — GLUCOSE, CAPILLARY
Glucose-Capillary: 285 mg/dL — ABNORMAL HIGH (ref 70–99)
Glucose-Capillary: 290 mg/dL — ABNORMAL HIGH (ref 70–99)
Glucose-Capillary: 305 mg/dL — ABNORMAL HIGH (ref 70–99)
Glucose-Capillary: 316 mg/dL — ABNORMAL HIGH (ref 70–99)
Glucose-Capillary: 322 mg/dL — ABNORMAL HIGH (ref 70–99)
Glucose-Capillary: 339 mg/dL — ABNORMAL HIGH (ref 70–99)

## 2021-06-07 LAB — MAGNESIUM: Magnesium: 2.2 mg/dL (ref 1.7–2.4)

## 2021-06-07 MED ORDER — RENA-VITE PO TABS
1.0000 | ORAL_TABLET | Freq: Every day | ORAL | Status: DC
  Administered 2021-06-07 – 2021-06-12 (×5): 1
  Filled 2021-06-07 (×5): qty 1

## 2021-06-07 MED ORDER — INSULIN GLARGINE-YFGN 100 UNIT/ML ~~LOC~~ SOLN
25.0000 [IU] | Freq: Every day | SUBCUTANEOUS | Status: DC
  Administered 2021-06-07 – 2021-06-09 (×3): 25 [IU] via SUBCUTANEOUS
  Filled 2021-06-07 (×4): qty 0.25

## 2021-06-07 MED ORDER — MIDODRINE HCL 5 MG PO TABS
10.0000 mg | ORAL_TABLET | Freq: Three times a day (TID) | ORAL | Status: DC
  Administered 2021-06-07 – 2021-06-08 (×5): 10 mg
  Filled 2021-06-07 (×4): qty 2

## 2021-06-07 MED ORDER — CHLORHEXIDINE GLUCONATE CLOTH 2 % EX PADS
6.0000 | MEDICATED_PAD | Freq: Every day | CUTANEOUS | Status: DC
  Administered 2021-06-09 – 2021-06-14 (×6): 6 via TOPICAL

## 2021-06-07 MED ORDER — CHLORHEXIDINE GLUCONATE 0.12% ORAL RINSE (MEDLINE KIT)
15.0000 mL | Freq: Two times a day (BID) | OROMUCOSAL | Status: DC
  Administered 2021-06-07 – 2021-06-09 (×5): 15 mL via OROMUCOSAL

## 2021-06-07 MED ORDER — ORAL CARE MOUTH RINSE
15.0000 mL | OROMUCOSAL | Status: DC
  Administered 2021-06-07 – 2021-06-12 (×38): 15 mL via OROMUCOSAL

## 2021-06-07 MED ORDER — CHLORHEXIDINE GLUCONATE 0.12% ORAL RINSE (MEDLINE KIT)
15.0000 mL | Freq: Two times a day (BID) | OROMUCOSAL | Status: DC
  Administered 2021-06-07 – 2021-06-14 (×11): 15 mL via OROMUCOSAL

## 2021-06-07 MED ORDER — ORAL CARE MOUTH RINSE
15.0000 mL | OROMUCOSAL | Status: DC
  Administered 2021-06-07 – 2021-06-08 (×10): 15 mL via OROMUCOSAL

## 2021-06-07 MED ORDER — INSULIN ASPART 100 UNIT/ML IJ SOLN
0.0000 [IU] | INTRAMUSCULAR | Status: DC
  Administered 2021-06-07 (×2): 15 [IU] via SUBCUTANEOUS
  Administered 2021-06-07: 11 [IU] via SUBCUTANEOUS
  Administered 2021-06-08: 7 [IU] via SUBCUTANEOUS
  Administered 2021-06-08: 11 [IU] via SUBCUTANEOUS
  Administered 2021-06-08: 7 [IU] via SUBCUTANEOUS
  Administered 2021-06-08: 11 [IU] via SUBCUTANEOUS
  Administered 2021-06-08: 7 [IU] via SUBCUTANEOUS
  Administered 2021-06-08: 4 [IU] via SUBCUTANEOUS
  Administered 2021-06-08: 7 [IU] via SUBCUTANEOUS
  Administered 2021-06-09 (×2): 3 [IU] via SUBCUTANEOUS
  Administered 2021-06-09: 4 [IU] via SUBCUTANEOUS
  Administered 2021-06-09: 7 [IU] via SUBCUTANEOUS
  Administered 2021-06-10 – 2021-06-11 (×5): 3 [IU] via SUBCUTANEOUS
  Administered 2021-06-11 – 2021-06-12 (×2): 7 [IU] via SUBCUTANEOUS
  Administered 2021-06-12: 4 [IU] via SUBCUTANEOUS
  Administered 2021-06-12: 7 [IU] via SUBCUTANEOUS
  Administered 2021-06-12 – 2021-06-13 (×4): 4 [IU] via SUBCUTANEOUS
  Administered 2021-06-13: 7 [IU] via SUBCUTANEOUS
  Administered 2021-06-13 (×2): 4 [IU] via SUBCUTANEOUS
  Administered 2021-06-13: 7 [IU] via SUBCUTANEOUS
  Administered 2021-06-14: 15 [IU] via SUBCUTANEOUS
  Administered 2021-06-14: 4 [IU] via SUBCUTANEOUS

## 2021-06-07 NOTE — Progress Notes (Signed)
Physical Therapy Treatment Patient Details Name: Richard Hall MRN: [id number] DOB: 08-07-1955 Today's Date: 06/07/2021   History of Present Illness Pt adm 12/30 with SOB. Found to have afib with rvr, acute on chronic respiratory failure with hypoxia, chf exacerbation, and Covid. On 1/3 pt with decr responsiveness and started on bipap and transferred to ICU. Increased AMS and lethargy on 06/06/21, intubated. PMH - ESRD on HD, CHf, afib, DM, neuropathy, HTN, CVA, metastatic prostate CA, lt femur ORIF.    PT Comments    The pt presents with slight improvements in alertness at this time, despite being intubated and on some sedating medication, the pt was able to maintain alertness for brief periods of time during which he followed ~50% of commands for movement of extremities. The pt was unable to safely progress to more mobility than chair position in bed at this time, but will likely progress well once extubated and off sedating meds. The pt does present with R head and gaze preference, but was able to turn actively to the L to verbal cues. More volitional movement noted in BLE than UE at this time, will continue to assess with future sessions.     Recommendations for follow up therapy are one component of a multi-disciplinary discharge planning process, led by the attending physician.  Recommendations may be updated based on patient status, additional functional criteria and insurance authorization.  Follow Up Recommendations  Skilled nursing-short term rehab (<3 hours/day) (may be able to upgrade to AIR if more alert after extubation)     Assistance Recommended at Discharge Frequent or constant Supervision/Assistance  Patient can return home with the following Two people to help with walking and/or transfers   Equipment Recommendations  Wheelchair (measurements PT);Wheelchair cushion (measurements PT);Hospital bed (lift)    Recommendations for Other Services       Precautions /  Restrictions Precautions Precautions: Fall Precaution Comments: intubated Restrictions Weight Bearing Restrictions: No     Mobility  Bed Mobility               General bed mobility comments: Placed bed in chair position, assist to align trunk    Transfers                   General transfer comment: not attempted           Cognition Arousal/Alertness: Lethargic;Suspect due to medications (pt sedated) Behavior During Therapy: Flat affect Overall Cognitive Status: Difficult to assess                                 General Comments: pt nods and follows commands inconsistently        Exercises General Exercises - Lower Extremity Ankle Circles/Pumps: PROM;Both;10 reps;Supine Heel Slides: PROM;Both;5 reps;Supine    General Comments General comments (skin integrity, edema, etc.): VSS with vent FiO2 40% PEEP of 10      Pertinent Vitals/Pain Pain Assessment: Faces Faces Pain Scale: Hurts little more Facial Expression: Relaxed, neutral Body Movements: Absence of movements Muscle Tension: Relaxed Compliance with ventilator (intubated pts.): Tolerating ventilator or movement Vocalization (extubated pts.): N/A CPOT Total: 0 Pain Location: L knee with ROM Pain Descriptors / Indicators: Grimacing Pain Intervention(s): Monitored during session;Repositioned    Home Living Family/patient expects to be discharged to:: Private residence Living Arrangements: Alone Available Help at Discharge: Available PRN/intermittently Type of Home: Apartment Home Access: Level entry       Home Layout:  One level Home Equipment: Cane - single Barista (2 wheels);Tub bench Additional Comments: Pt unable to provide. Above info from prior encounter in 2020    Prior Function            PT Goals (current goals can now be found in the care plan section) Acute Rehab PT Goals Patient Stated Goal: pt unable PT Goal Formulation: Patient unable to  participate in goal setting Time For Goal Achievement: 06/19/21 Potential to Achieve Goals: Fair Progress towards PT goals: Not progressing toward goals - comment (continued lethargy)    Frequency    Min 2X/week      PT Plan Current plan remains appropriate    Co-evaluation PT/OT/SLP Co-Evaluation/Treatment: Yes Reason for Co-Treatment: Complexity of the patient's impairments (multi-system involvement);Necessary to address cognition/behavior during functional activity;For patient/therapist safety PT goals addressed during session: Mobility/safety with mobility;Balance;Strengthening/ROM OT goals addressed during session: Strengthening/ROM      AM-PAC PT "6 Clicks" Mobility   Outcome Measure  Help needed turning from your back to your side while in a flat bed without using bedrails?: Total Help needed moving from lying on your back to sitting on the side of a flat bed without using bedrails?: Total Help needed moving to and from a bed to a chair (including a wheelchair)?: Total Help needed standing up from a chair using your arms (e.g., wheelchair or bedside chair)?: Total Help needed to walk in hospital room?: Total Help needed climbing 3-5 steps with a railing? : Total 6 Click Score: 6    End of Session   Activity Tolerance: Patient limited by lethargy Patient left: in bed;with call bell/phone within reach Nurse Communication: Mobility status PT Visit Diagnosis: Other abnormalities of gait and mobility (R26.89)     Time: 4235-3614 PT Time Calculation (min) (ACUTE ONLY): 20 min  Charges:  $Therapeutic Exercise: 8-22 mins                     West Carbo, PT, DPT   Acute Rehabilitation Department Pager #: [phone]   Sandra Cockayne 06/07/2021, 5:42 PM

## 2021-06-07 NOTE — TOC Progression Note (Signed)
Transition of Care [facility]) - Progression Note    Patient Details  Name: Richard Hall MRN: [id number] Date of Birth: [birth year]  [facility] CM/SW [place], [place] Phone Number: 06/07/2021, 4:21 PM  Clinical Narrative:     CSW continues to follow patient and will fax out initial referral for SNF placement when appropriate. Patient has cortrak. CSW will continue to follow and assist with patients dc planning needs.  Expected Discharge Plan: Skilled Nursing Facility Barriers to Discharge: Continued Medical Work up  Expected Discharge Plan and Services Expected Discharge Plan: [place] In-house Referral: Clinical Social Work     Living arrangements for the past 2 months: Single Family Home                                       Social Determinants of Health (SDOH) Interventions    Readmission Risk Interventions Readmission Risk Prevention Plan 01/12/2019  Transportation Screening Complete  PCP or Specialist Appt within 3-5 Days Complete  HRI or [place] Complete  Social Work Consult for [place] Planning/Counseling Complete  Palliative Care Screening Not Applicable  Medication Review Press photographer) Complete  Some recent data might be hidden

## 2021-06-07 NOTE — Progress Notes (Signed)
Nutrition Follow-up  DOCUMENTATION CODES:   Non-severe (moderate) malnutrition in context of chronic illness  INTERVENTION:   Continue TF via Cortrak tube: Nepro at 50 ml/h (1200 ml per day) Prosource TF 45 ml TID  Provides 2240 kcal, 130 gm protein, 872 ml free water daily.  Add Renal MVI daily via tube.  NUTRITION DIAGNOSIS:   Moderate Malnutrition related to chronic illness (ESRD/HD, CHF) as evidenced by mild muscle depletion, mild fat depletion.  Ongoing   GOAL:   Patient will meet greater than or equal to 90% of their needs  Met with TF  MONITOR:   TF tolerance, Labs, Diet advancement, Weight trends  REASON FOR ASSESSMENT:   Rounds (Cortrak, ESRD)    ASSESSMENT:   66 yo male admitted with 12/30 with SOB with +COVID-19 infection. Pt with decreased level of consciousness on 1/03 and transferred to ICU. PMH includes DM, ESRD/HD, CHF, CVA, HTN, hepatitis, hx prostate cancer. Per MD note, pt utilizes wheelchair  Patient required intubation on 1/5 for TEE.   Cortrak was placed 1/4, tip is in the pyloric region.  Currently receiving Nepro at 50 ml/h with Prosource TF 45 ml TID to provide 2240 kcal, 130 gm protein, 872 ml free water daily.  RN reports tolerating TF well.   Patient remains intubated on ventilator support MV: 14 L/min Temp (24hrs), Avg:98.2 F (36.8 C), Min:97.5 F (36.4 C), Max:99.3 F (37.4 C)   Labs reviewed. Na 133, phos 5.2 (trending down) CBG: [phone]  Medications reviewed and include Levophed.  Bed was changed out today and new weight was obtained: 105.4 kg This is well below EDW of 123 kg  Last HD 1/5, Net UF 740 ml per Post HD flow sheet. Next HD scheduled 1/7.  I/O net +2.2 L since admission.  Diet Order:   Diet Order             Diet NPO time specified  Diet effective now                   EDUCATION NEEDS:   Not appropriate for education at this time  Skin:  Skin Assessment: Reviewed RN  Assessment  Last BM:  1/05  Height:   Ht Readings from Last 1 Encounters:  06/06/21 6' (1.829 m)    Weight:   Wt Readings from Last 1 Encounters:  06/07/21 105.4 kg    BMI:  Body mass index is 31.51 kg/m.  Estimated Nutritional Needs:   Kcal:  2200-2400 kcals  Protein:  122-146 g  Fluid:  1000 ml plus UOP   Lucas Mallow RD, LDN, CNSC Please refer to Amion for contact information.

## 2021-06-07 NOTE — Progress Notes (Signed)
[facility] Progress Note  Name: Richard Hall MRN: [id number] DOB: [birth year]  Chief Complaint:  Shortness of breath   Subjective:  last HD 1/5 early am with 0.7 kg UF.  BP limited by UF per nursing report.  He has been on levo at 2 mcg/min.  He was intubated in part due to need for TEE per nursing.  His bed was changed out and they are going to get a weight.    Review of systems:  Unable to obtain 2/2 mechanical ventilation    Intake/Output Summary (Last 24 hours) at 06/07/2021 0611 Last data filed at 06/07/2021 0400 Gross per 24 hour  Intake 2442.98 ml  Output 740 ml  Net 1702.98 ml    Vitals:  Vitals:   06/07/21 0400 06/07/21 0415 06/07/21 0430 06/07/21 0445  BP: 104/67 (!) 91/57 (!) 88/62 94/60  Pulse: 94 89 88 89  Resp: (!) 24 (!) 24 (!) 24 (!) 24  Temp: 98.2 F (36.8 C)     TempSrc: Axillary     SpO2: 100% 100% 100% 100%  Weight:      Height:         Physical Exam:   General adult male in bed intubated HEENT normocephalic atraumatic Neck supple trachea midline Lungs clear breath sounds anteriorly 40% FIO2 and PEEP 10  Heart S1S2 no rub Abdomen soft nontender nondistended/obese habitus Extremities no edema lower extremities Neuro eyes open with exam; on continuous sedation Psych - no active agitation but has mittens Access LUE AVF bruit and thrill   Medications reviewed   Labs:  BMP Latest Ref Rng & Units 06/07/2021 06/06/2021 06/06/2021  Glucose 70 - 99 mg/dL 302(H) - -  BUN 8 - 23 mg/dL 78(H) - -  Creatinine 0.61 - 1.24 mg/dL 5.60(H) - -  Sodium 135 - 145 mmol/L 133(L) 132(L) 129(L)  Potassium 3.5 - 5.1 mmol/L 4.1 3.8 4.4  Chloride 98 - 111 mmol/L 95(L) - -  CO2 22 - 32 mmol/L 23 - -  Calcium 8.9 - 10.3 mg/dL 7.9(L) - -   Outpatient dialysis orders:  [place] MWF Outpt prescription: F180, BFR 450, 4hrs, 2 K, 2 Ca, EDW 123kg AVF Meds - mircera 225 mcg every 2 weeks - last received 200 mcg on 05/15/21 and was ordered to increase venofer  100 mg (ordered for 10 doses) Act vit D not ordered  Assessment/Plan:   # ESRD:   - Left below his EDW at 121kg last outpatient tx.  It was felt most of his hypoxia was related to covid.  Expect weight loss with covid though.  Usually HD per MWF schedule - got HD very early am on 1/3.  - Plan for next HD on 1/7.  Next week will be MWF schedule again   - assess EDW - has been below and not recently weighed   # Encephalopathy  - unclear etiology but improved after bipap per nursing - work-up per primary team - optimize clearance with HD   # Volume/ hypertension: EDW 123kg. But left below EDW at 121 last outpatient tx on 12/30.  Weights here do not appear charted correctly (118 kg charted as pre-HD weight) - perhaps is post as 3 kg was removed.  Bed broken most recently    # Anemia of Chronic Kidney Disease: Ferritin 5300 so defer iron. On aranesp 200 mcg weekly on wed   # Secondary Hyperparathyroidism/Hyperphosphatemia: hyperphos. on [place] and HD. Increased to 2.5 calcium bath here   #Acute hypoxic respiratory  failure secondary to COVID-pneumonia:  - vent per pulm - optimize volume with HD  # Covid PNA  Treatment per primary team. On dexamethasone. S/p remdesivir    #Atrial Fibrillation w/ RVR: Management per primary team. On amio   #Hyperkalemia: resolved with HD.    Disposition -  in ICU  Claudia Desanctis, MD 06/07/2021 6:29 AM

## 2021-06-07 NOTE — Progress Notes (Signed)
NAME:  Richard Hall, MRN:  [id number], DOB:  [birth year], LOS: 6 ADMISSION DATE:  05/31/2021, CONSULTATION DATE: 06/04/2021 REFERRING MD: [facility], CHIEF COMPLAINT: Altered mental status  History of Present Illness:  66 year old male with a plethora of health issues was admitted on 05/31/2021 shortness of breath was found to be COVID-19 positive.  He is end-stage renal disease underwent dialysis on 06/04/2020 and was a -2 L.  He had decreased level of consciousness on 06/04/2021 and there was concern for hypercarbia but his PCO2 was 46 on ABGs therefore another source of confusion and altered mental status is being evaluated.  He is requiring noninvasive mechanical ventilatory support at this time.  He is going to CT scan for further evaluation of altered mental status to be transferred to intensive care unit first to be stabilized and then go to CT scan.  He has an extensive past medical history is well-documented below.  Pertinent  Medical History   Past Medical History:  Diagnosis Date   A-V fistula (HCC)    left arm   Anemia    Arthritis    CHF (congestive heart failure) (HCC)    CVA (cerebral vascular accident) ([place]) 2010   Diabetes mellitus with end-stage renal disease ([place])    Dyalisis pt.   Diabetic neuropathy (HCC)    Bilateral feet   Dyslipidemia    Dysrhythmia    A-fib   Essential hypertension    Hepatitis    childhood   History of blood transfusion    Hypothyroidism (acquired)    OSA on CPAP    Prostate cancer [facility])    Uses wheelchair      Significant Hospital Events: Including procedures, antibiotic start and stop dates in addition to other pertinent events   06/04/2021 transferred intensive care unit for altered mental status 1/4 tolerated Bipap overnight, remains somewhat lethargic on nasal cannula, though protecting his airway  Studies 1/3 CTH>No acute intracranial hemorrhage or definite evidence of acute infarction. There are few small vessel infarcts of the basal  ganglia bilaterally, but at least some were present previously.   Micro 1/4 Bcx2> +MSSA 12/30 Covid + 1/4 RVP>negative   Abx Vancomycin 1/4 Cefepime 1/4 Cefazolin 1/5-  Interim History / Subjective:  Intubated yesterday evening for mental status and TEE Stable overnight Remains on low dose levophed  Objective   Blood pressure (!) 103/59, pulse 88, temperature 98.2 F (36.8 C), temperature source Axillary, resp. rate (!) 24, height 6' (1.829 m), weight 105.4 kg, SpO2 100 %.    Vent Mode: PRVC FiO2 (%):  [40 %-100 %] 40 % Set Rate:  [18 bmp-24 bmp] 24 bmp Vt Set:  [620 mL] 620 mL PEEP:  [10 cmH20] 10 cmH20 Plateau Pressure:  [22 cmH20-23 cmH20] 22 cmH20   Intake/Output Summary (Last 24 hours) at 06/07/2021 5916 Last data filed at 06/07/2021 0800 Gross per 24 hour  Intake 2308.81 ml  Output 740 ml  Net 1568.81 ml    Filed Weights   06/06/21 1300 06/07/21 0630  Weight: 119.4 kg 105.4 kg      General:  obese elderly M, intubated and sedated in no acute distress, HEENT: MM pink/moist, sclera anicteric, pupils equal  Neuro:  examined on fentanyl, pt sleepy but arousable and nods to questions CV: s1s2 rrr, no m/r/g PULM:  mechanical vent sounds without significant wheezing and rhonchi, PEEP 10 and 40% FiO2 GI: soft, bsx4 active  Extremities: warm/dry, 1+ edema  Skin: no rashes or lesions   WBC 9.4k  Hgb 7.0 Na 133 K 4.1  Resolved Hospital Problem list     Assessment & Plan:    Acute Altered mental status in the setting of COVID-19 infection and pneumonia,  ESRD stage renal disease and MSSA Bacteremia with septic shock Dialysis T/TH/Sat Transferred to intensive care for monitoring, possible intubation Head CT with small basal ganglia infarcts, spoke with radiology, perhaps one small infarct was not present on prior imaging, but do not think would be causing clinical acute encephalopathy -pt intubated yesterday for continued AMS and risk of aspiration along  with need for TEE -once TEE complete start SAT/SBT -suspect AMS is secondary to bacteremia, repeat ABG yesterday without hypercapnia  -place cortrak placed -continue to monitor in ICU, remains an intubation risk -Cefazolin for MSSA bacteremia, ID following  -continue peripheral levophed to maintain MAP >65, midodrine added      Hypoxic Respiratory Failure secondary to Covid-19 pneumonia Baseline OSA Home BiPAP at 30% FiO2 with sats at 96%, but spoke with son who says that he is not using Bipap regularly  RVP negative -continue Dexamethasone for 10 day course -Maintain full vent support with SAT/SBT as tolerated once TEE completed -titrate Vent setting to maintain SpO2 greater than or equal to 90%. -HOB elevated 30 degrees. -Plateau pressures less than 30 cm H20.  -Follow chest x-ray, ABG prn.   -Bronchial hygiene and RT/bronchodilator protocol.      Atrial fibrillation with rapid ventricular response Mitral valve calcification/thickening on echo, concern for severe MR On amiodarone gtt EF 60-65% -now has cortrak, resume home metoprolol and off amiodarone as BP will allow -continue Eliquis -TEE today    Liver enzymes elevated -improving, continue to monitor     End-stage renal disease Last dialysis 06/04/2020 with -2 L -HD per nephrology, last session 1/5  Hypothyroidism -continue Synthroid   Diabetes mellitus -continue SSI -TF coverage and Semglee added yesterday and glucose still >300 -change SSI scale to resistant scale and increase Semglee to 25 units   Best Practice (right click and "Reselect all SmartList Selections" daily)   Diet/type: tubefeeds DVT prophylaxis: DOAC GI prophylaxis: PPI Lines: N/A Foley:  N/A Code Status:  full code Last date of multidisciplinary goals of care discussion [Family updated yesterday about intubation, pending today]  Labs   CBC: Recent Labs  Lab 05/31/21 1700 05/31/21 1717 06/04/21 0620 06/05/21 0109  06/05/21 1214 06/06/21 0606 06/06/21 0748 06/06/21 1250 06/06/21 1706 06/07/21 0127  WBC 12.1*   < > 17.9* 17.5*  --  8.9 10.0  --   --  9.4  NEUTROABS 10.1*  --   --   --   --   --   --   --   --   --   HGB 8.7*   < > 7.5* 7.7*   < > 6.9* 7.4* 8.5* 8.8* 7.0*  HCT 27.2*   < > 23.1* 24.4*   < > 22.0* 23.7* 25.0* 26.0* 21.6*  MCV 91.0   < > 88.5 92.1  --  94.8 92.6  --   --  90.8  PLT 269   < > 312 291  --  215 229  --   --  217   < > = values in this interval not displayed.     Basic Metabolic Panel: Recent Labs  Lab 06/04/21 0620 06/04/21 1432 06/05/21 0109 06/05/21 1214 06/05/21 1502 06/05/21 1646 06/06/21 0606 06/06/21 1250 06/06/21 1651 06/06/21 1706 06/07/21 0127  NA 133* 132* 132* 131*  --   --  130* 129*  --  132* 133*  K 3.0* 4.4 4.5 4.4  --   --  4.3 4.4  --  3.8 4.1  CL 93* 93* 93*  --   --   --  90*  --   --   --  95*  CO2 24 24 22   --   --   --  20*  --   --   --  23  GLUCOSE 100* 129* 161*  --   --   --  382*  --   --   --  302*  BUN 60* 62* 77*  --   --   --  125*  --   --   --  78*  CREATININE 4.40* 5.83* 6.58*  --   --   --  8.80*  --   --   --  5.60*  CALCIUM 7.4* 7.4* 7.5*  --   --   --  7.1*  --   --   --  7.9*  MG 2.1  --  2.3  --  2.3 2.3 2.6*  --  2.4  --  2.2  PHOS 4.8*  --  7.4*  --  7.4* 6.9* 8.2*  --  5.9*  --  5.2*    GFR: Estimated Creatinine Clearance: 16.3 mL/min (A) (by C-G formula based on SCr of 5.6 mg/dL (H)). Recent Labs  Lab 06/05/21 0109 06/06/21 0606 06/06/21 0748 06/07/21 0127  WBC 17.5* 8.9 10.0 9.4  LATICACIDVEN  --  1.3  --   --      Liver Function Tests: Recent Labs  Lab 06/02/21 0214 06/03/21 0358 06/04/21 0620 06/04/21 1432 06/06/21 0606  AST 1,365* 601* 464* 345* 72*  ALT 618* 494* 466* 372* 183*  ALKPHOS 119 111 113 104 80  BILITOT 0.8 1.2 0.7 0.7 0.6  PROT 6.8 6.5 6.7 6.5 5.5*  ALBUMIN 2.6* 2.6* 2.5* 2.2* 1.7*    No results for input(s): LIPASE, AMYLASE in the last 168 hours. Recent Labs  Lab  06/04/21 1040  AMMONIA 30     ABG    Component Value Date/Time   PHART 7.283 (L) 06/06/2021 1706   PCO2ART 56.8 (H) 06/06/2021 1706   PO2ART 485 (H) 06/06/2021 1706   HCO3 27.0 06/06/2021 1706   TCO2 29 06/06/2021 1706   ACIDBASEDEF 5.0 (H) 06/06/2021 1250   O2SAT 100.0 06/06/2021 1706      Coagulation Profile: No results for input(s): INR, PROTIME in the last 168 hours.  Cardiac Enzymes: No results for input(s): CKTOTAL, CKMB, CKMBINDEX, TROPONINI in the last 168 hours.  HbA1C: Hgb A1c MFr Bld  Date/Time Value Ref Range Status  06/01/2021 02:32 AM 6.1 (H) 4.8 - 5.6 % Final    Comment:    (NOTE) Pre diabetes:          5.7%-6.4%  Diabetes:              >6.4%  Glycemic control for   <7.0% adults with diabetes   07/26/2020 10:45 AM 10.1 (H) 4.8 - 5.6 % Final    Comment:    (NOTE) Pre diabetes:          5.7%-6.4%  Diabetes:              >6.4%  Glycemic control for   <7.0% adults with diabetes     CBG: Recent Labs  Lab 06/06/21 1546 06/06/21 2026 06/07/21 0022 06/07/21 0406 06/07/21 0827  GLUCAP 311* 204* 285* 305* 316*  Review of Systems:   NA  Past Medical History:  He,  has a past medical history of A-V fistula ([place]), Anemia, Arthritis, CHF (congestive heart failure) ([place]), CVA (cerebral vascular accident) ([place]) (2010), Diabetes mellitus with end-stage renal disease ([place]), Diabetic neuropathy ([place]), Dyslipidemia, Dysrhythmia, Essential hypertension, Hepatitis, History of blood transfusion, Hypothyroidism (acquired), OSA on CPAP, Prostate cancer ([place]), and Uses wheelchair.   Surgical History:   Past Surgical History:  Procedure Laterality Date   APPENDECTOMY     COLONOSCOPY     FEMUR IM NAIL Left 05/20/2019   Procedure: Intramedullary (Im) Retrograde Femoral Nailing for impending pathologic fracture;  Surgeon: Altamese [place], MD;  Location: [facility];  Service: Orthopedics;  Laterality: Left;   ORIF FEMUR FRACTURE Left 05/20/2019   Procedure:  OPEN REDUCTION INTERNAL FIXATION (ORIF) DISTAL FEMUR FRACTURE with intercondylar extension;  Surgeon: Altamese , MD;  Location: [facility];  Service: Orthopedics;  Laterality: Left;   TRANSURETHRAL RESECTION OF BLADDER TUMOR N/A 01/07/2019   Procedure: CYSTOSCOPY/ EVEACUATION CLOT;  Surgeon: Bjorn Loser, MD;  Location: [facility];  Service: Urology;  Laterality: N/A;   TRANSURETHRAL RESECTION OF PROSTATE N/A 07/26/2020   Procedure: TRANSURETHRAL RESECTION OF THE PROSTATE (TURP);  Surgeon: Alexis Frock, MD;  Location: [facility];  Service: Urology;  Laterality: N/A;  20 MINS     Social History:   reports that he quit smoking about 26 years ago. He has never used smokeless tobacco. He reports that he does not drink alcohol and does not use drugs.   Family History:  His family history includes Cancer in his mother.   Allergies Allergies  Allergen Reactions   Gabapentin Swelling     Home Medications  Prior to Admission medications   Medication Sig Start Date End Date Taking? Authorizing Provider  acetaminophen (TYLENOL) 500 MG tablet Take 500-1,000 mg by mouth every 6 (six) hours as needed (for pain.).   Yes [provider]  amitriptyline (ELAVIL) 100 MG tablet Take 1 tablet (100 mg total) by mouth at bedtime. 05/25/19  Yes Christian, Rylee, MD  atorvastatin (LIPITOR) 80 MG tablet Take 80 mg by mouth daily. 03/08/20  Yes [provider]  diphenoxylate-atropine (LOMOTIL) 2.5-0.025 MG tablet Take 1 tablet by mouth 4 (four) times daily as needed for diarrhea or loose stools. 04/23/21  Yes [provider]  ELIQUIS 2.5 MG TABS tablet Take 2.5 mg by mouth 2 (two) times daily. 04/22/21  Yes [provider]  EUTHYROX 88 MCG tablet Take 88 mcg by mouth daily before breakfast. 02/06/20  Yes [provider]  furosemide (LASIX) 80 MG tablet Take 1 tablet (80 mg total) by mouth 4 (four) times a week. Mondays, wednesdays, fridays, and sundays Patient taking  differently: Take 80 mg by mouth See admin instructions. Mondays, Wednesdays, and Saturdays 05/26/19  Yes Christian, Rylee, MD  insulin glargine, 1 Unit Dial, (TOUJEO SOLOSTAR) 300 UNIT/ML Solostar Pen Inject 24 Units into the skin at bedtime.   Yes [provider]  LEUPROLIDE ACETATE, 6 MONTH, 45 MG injection Inject 45 mg into the skin every 6 (six) months. 05/25/19  Yes Christian, Rylee, MD  levothyroxine (SYNTHROID) 100 MCG tablet Take 100 mcg by mouth daily. 05/21/21  Yes [provider]  lidocaine-prilocaine (EMLA) cream Apply 1 application topically See admin instructions. Apply small amount to access site 1-2 hours before dialysis, cover with occlusive dressing (saran wrap) 05/25/19  Yes Christian, Rylee, MD  metoprolol tartrate (LOPRESSOR) 25 MG tablet Take 25 mg by mouth 2 (two)  times daily. 02/06/20  Yes [provider]  sertraline (ZOLOFT) 100 MG tablet Take 100 mg by mouth daily. 02/06/20  Yes [provider]  XTANDI 40 MG capsule Take 160 mg by mouth at bedtime. 03/12/20  Yes [provider]  ferric citrate (AURYXIA) 1 GM 210 MG(Fe) tablet Take 2 tablets (420 mg total) by mouth 3 (three) times daily with meals. Patient not taking: Reported on 06/01/2021 05/25/19   Mitzi Hansen, MD  fluticasone [facility] ALLERGY RELIEF) 50 MCG/ACT nasal spray Place 1 spray into both nostrils as needed. Patient not taking: Reported on 06/01/2021 05/25/19   Mitzi Hansen, MD  glipiZIDE (GLUCOTROL) 10 MG tablet Take 5 mg by mouth daily. Patient not taking: Reported on 06/01/2021 02/06/20   [provider]  LINZESS 72 MCG capsule Take 1 capsule (72 mcg total) by mouth as needed. Patient not taking: Reported on 06/01/2021 05/25/19   Mitzi Hansen, MD  traMADol (ULTRAM) 50 MG tablet Take 1-2 tablets (50-100 mg total) by mouth every 6 (six) hours as needed for moderate pain or severe pain. Post-operatively Patient not taking: Reported on 06/01/2021  07/27/20 07/27/21  Alexis Frock, MD     Critical care time: 32 min     CRITICAL CARE Performed by: Otilio Carpen Charne Mcbrien   Total critical care time: 32 minutes  Critical care time was exclusive of separately billable procedures and treating other patients.  Critical care was necessary to treat or prevent imminent or life-threatening deterioration.  Critical care was time spent personally by me on the following activities: development of treatment plan with patient and/or surrogate as well as nursing, discussions with consultants, evaluation of patient's response to treatment, examination of patient, obtaining history from patient or surrogate, ordering and performing treatments and interventions, ordering and review of laboratory studies, ordering and review of radiographic studies, pulse oximetry and re-evaluation of patient's condition.    Otilio Carpen Magally Vahle, PA-C [facility] See Amion for pager If no response to pager , please call [phone] until 7pm After 7:00 pm call Elink  [phone]?[place]

## 2021-06-07 NOTE — Progress Notes (Signed)
eLink Physician-Brief Progress Note Patient Name: Richard Hall DOB: 1955/06/26 MRN: [id number]   Date of Service  06/07/2021  HPI/Events of Note  Bedside RT and RN want scheduled MRI knee deferred to the AM due to staffing issues  over night.  eICU Interventions  Nursing communication sent deferring trip to MRI to the AM.        [name] U Wylan Gentzler 06/07/2021, 11:18 PM

## 2021-06-07 NOTE — Progress Notes (Signed)
[facility] for Infectious Disease  Date of Admission:  05/31/2021   Total days of inpatient antibiotics 2  Principal Problem:   Atrial fibrillation with RVR (HCC) Active Problems:   OSA on CPAP   Hypothyroidism (acquired)   Essential hypertension   Dyslipidemia   Diabetes mellitus with end-stage renal disease (HCC)   ESRD (end stage renal disease) on dialysis [facility])   Atrial fibrillation (HCC)   Personal history of transient ischemic attack (TIA), and cerebral infarction without residual deficits   Acute on chronic respiratory failure with hypoxia (HCC)   CHF exacerbation (HCC)   COVID-19 virus infection   Elevated troponin   Chronic obstructive pulmonary disease with acute lower respiratory infection (HCC)   MSSA bacteremia   Encephalopathy acute   Malnutrition of moderate degree   Respiratory failure ([place])          Assessment: 12 YM with ESRD on iHD via left arm fistula, 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 Found to be in Afib with rvr (Hx of Afib SP watchman), CHF exacerbation, COVID infection. Remdesivir was started, but stopped due to elevated LFTs. Hospital course complicated by fever, found to have MSSA bacteremia. ID engaged.    # MSSA bacteremia, suspect hospital associated # Fever #Left knee pain -Etiology of bacteremia is unclear, old right hand IV site does not look infected.  -He was intubated overnight due to AMS. Prior to this pt's respiratory status was around baseline(he is on 2L at home->3L inpatient) as such I do not think his bacteremia  2/2 MSSA pneumonia. In addition, CXR showed only small patchy infiltrates in right perihilar and right lower lung fields. If respiratory status worsens and unable to wean off ventilator then consider CT.   -CT maxilla was unrevealing  -He also has a tender left knee in the setting of ORIF distal femur on 05/20/19. Xray left knee did not show acute abnormalities.   -TTE on 12/31 showed moderate mitral valve leaflet thickening with severe MR. TEE ordered.  Recommendations:  -Continue ancef with iHD -Follow repeat blood Cx to ensure clearance -Ordered MRI left knee -TEE planned for today     #COVID infection #CHF exacerbation #Acute on chronic respiratory failure-improved -Agree on holding remdesivir. Pt's respiratory status  has improved and  almost at baseline.   Microbiology:   Antibiotics: Vancomycin and cefepime 1/04-p Remdesivir 12/30-31     Cultures: Blood 06/05/21 2/2 MSSA   SUBJECTIVE: Intubated, cannot provide history.  Interval: Intubated for airway protection as pt had AMS.  Review of Systems: ROS   Scheduled Meds:  apixaban  5 mg Oral BID   chlorhexidine gluconate (MEDLINE KIT)  15 mL Mouth Rinse BID   chlorhexidine gluconate (MEDLINE KIT)  15 mL Mouth Rinse BID   Chlorhexidine Gluconate Cloth  6 each Topical Q0600   Chlorhexidine Gluconate Cloth  6 each Topical Q0600   Chlorhexidine Gluconate Cloth  6 each Topical Q0600   [START ON 06/08/2021] Chlorhexidine Gluconate Cloth  6 each Topical Q0600   [START ON 06/12/2021] darbepoetin (ARANESP) injection - DIALYSIS  200 mcg Intravenous Q Wed-HD   dexamethasone (DECADRON) injection  4 mg Intravenous Q24H   docusate  100 mg Per Tube BID   feeding supplement (NEPRO CARB STEADY)  1,000 mL Per Tube BID BM   feeding supplement (PROSource TF)  45 mL Per Tube TID   fentaNYL (SUBLIMAZE) injection  25 mcg Intravenous Once  insulin aspart  0-20 Units Subcutaneous Q4H   insulin glargine-yfgn  25 Units Subcutaneous QHS   ipratropium-albuterol  3 mL Nebulization Q6H   [START ON 06/08/2021] levothyroxine  50 mcg Intravenous Daily   mouth rinse  15 mL Mouth Rinse 10 times per day   mouth rinse  15 mL Mouth Rinse 10 times per day   midodrine  10 mg Per Tube TID WC   multivitamin  1 tablet Per Tube QHS   pantoprazole (PROTONIX) IV  40 mg Intravenous Daily   polyethylene glycol  17 g Per  Tube Daily   sodium chloride flush  3 mL Intravenous Q12H   Continuous Infusions:  sodium chloride 10 mL/hr at 06/06/21 1700    ceFAZolin (ANCEF) IV Stopped (06/07/21 1915)   fentaNYL infusion INTRAVENOUS 150 mcg/hr (06/07/21 2000)   norepinephrine (LEVOPHED) Adult infusion 2 mcg/min (06/07/21 2000)   PRN Meds:.acetaminophen **OR** acetaminophen, fentaNYL, metoprolol tartrate Allergies  Allergen Reactions   Gabapentin Swelling    OBJECTIVE: Vitals:   06/07/21 1700 06/07/21 1800 06/07/21 1900 06/07/21 2000  BP: 116/71 103/60 (!) 98/58 (!) 103/57  Pulse: 94 83 79 77  Resp: (!) 21 (!) 24 (!) 24 (!) 24  Temp:    97.9 F (36.6 C)  TempSrc:    Axillary  SpO2: 100% 100% 100% 100%  Weight:      Height:       Body mass index is 31.51 kg/m.  Physical Exam    Lab Results Lab Results  Component Value Date   WBC 9.4 06/07/2021   HGB 7.0 (L) 06/07/2021   HCT 21.6 (L) 06/07/2021   MCV 90.8 06/07/2021   PLT 217 06/07/2021    Lab Results  Component Value Date   CREATININE 5.60 (H) 06/07/2021   BUN 78 (H) 06/07/2021   NA 133 (L) 06/07/2021   K 4.1 06/07/2021   CL 95 (L) 06/07/2021   CO2 23 06/07/2021    Lab Results  Component Value Date   ALT 183 (H) 06/06/2021   AST 72 (H) 06/06/2021   ALKPHOS 80 06/06/2021   BILITOT 0.6 06/06/2021        Laurice Record, [place] [facility] 06/07/2021, 9:44 PM

## 2021-06-07 NOTE — Evaluation (Signed)
Occupational Therapy Evaluation Patient Details Name: Richard Hall MRN: [id number] DOB: 1955/11/16 Today's Date: 06/07/2021   History of Present Illness Pt adm 12/30 with SOB. Found to have afib with rvr, acute on chronic respiratory failure with hypoxia, chf exacerbation, and Covid. On 1/3 pt with decr responsiveness and started on bipap and transferred to ICU. Increased AMS and lethargy on 06/06/21, intubated. PMH - ESRD on HD, CHf, afib, DM, neuropathy, HTN, CVA, metastatic prostate CA, lt femur ORIF.   Clinical Impression   Pt was living alone, assume independently, prior to admission. Decreased ability to participate in OT eval due to lethargy, pt receiving sedating medication. Pt is dependent in all mobility and ADL. Following commands inconsistently. Performed PROM B UEs and neck. Placed bed in chair position, VSS on ventilator.      Recommendations for follow up therapy are one component of a multi-disciplinary discharge planning process, led by the attending physician.  Recommendations may be updated based on patient status, additional functional criteria and insurance authorization.   Follow Up Recommendations  Skilled nursing-short term rehab (<3 hours/day)    Assistance Recommended at Discharge Frequent or constant Supervision/Assistance  Patient can return home with the following Two people to help with walking and/or transfers;Two people to help with bathing/dressing/bathroom    Functional Status Assessment  Patient has had a recent decline in their functional status and/or demonstrates limited ability to make significant improvements in function in a reasonable and predictable amount of time  Equipment Recommendations  Other (comment) (defer to next venue)    Recommendations for Other Services       Precautions / Restrictions Precautions Precautions: Fall      Mobility Bed Mobility               General bed mobility comments: Placed bed in chair position,  assist to align trunk    Transfers                   General transfer comment: not attempted      Balance                                           ADL either performed or assessed with clinical judgement   ADL                                         General ADL Comments: dependent     Vision   Additional Comments: not able to formally assess     Perception     Praxis      Pertinent Vitals/Pain Pain Assessment: Faces Faces Pain Scale: Hurts little more Facial Expression: Relaxed, neutral Body Movements: Absence of movements Muscle Tension: Relaxed Compliance with ventilator (intubated pts.): Tolerating ventilator or movement Vocalization (extubated pts.): N/A CPOT Total: 0 Pain Location: L knee with ROM Pain Descriptors / Indicators: Grimacing Pain Intervention(s): Monitored during session;Repositioned     Hand Dominance Right   Extremity/Trunk Assessment Upper Extremity Assessment Upper Extremity Assessment: RUE deficits/detail;LUE deficits/detail;Generalized weakness RUE Deficits / Details: Full PROM, moved wrist to wave goodbye, "catching'" in shoulder, unable to elicit opening and closing hand RUE Coordination: decreased fine motor;decreased gross motor LUE Deficits / Details: Full PROM, moved wrist to wave goodbye, unable to elicit opening and closing hand  LUE Coordination: decreased fine motor;decreased gross motor   Lower Extremity Assessment Lower Extremity Assessment: Defer to PT evaluation   Cervical / Trunk Assessment Cervical / Trunk Assessment: Other exceptions Cervical / Trunk Exceptions: weakness, maintains neck rotated 30 degrees toward R, performed gentle PROM   Communication Communication Communication: Expressive difficulties (intubated)   Cognition Arousal/Alertness: Lethargic;Suspect due to medications (pt sedated) Behavior During Therapy: Flat affect Overall Cognitive Status: Difficult to  assess                                 General Comments: pt nods and follows commands inconsistently     General Comments       Exercises     Shoulder Instructions      Home Living Family/patient expects to be discharged to:: Private residence Living Arrangements: Alone Available Help at Discharge: Available PRN/intermittently Type of Home: Apartment Home Access: Level entry     Home Layout: One level     Bathroom Shower/Tub: Teacher, early years/pre: Standard     Home Equipment: Cane - single Barista (2 wheels);Tub bench   Additional Comments: Pt unable to provide. Above info from prior encounter in 2020      Prior Functioning/Environment Prior Level of Function : Other (comment) (unknown)                        OT Problem List: Decreased strength;Decreased activity tolerance;Decreased coordination;Decreased cognition;Cardiopulmonary status limiting activity;Obesity      OT Treatment/Interventions: Self-care/ADL training;DME and/or AE instruction;Therapeutic activities;Patient/family education;Balance training;Cognitive remediation/compensation;Therapeutic exercise    OT Goals(Current goals can be found in the care plan section) Acute Rehab OT Goals OT Goal Formulation: Patient unable to participate in goal setting Time For Goal Achievement: 06/21/21 Potential to Achieve Goals: Fair  OT Frequency: Min 2X/week    Co-evaluation PT/OT/SLP Co-Evaluation/Treatment: Yes Reason for Co-Treatment: Complexity of the patient's impairments (multi-system involvement)   OT goals addressed during session: Strengthening/ROM      AM-PAC OT "6 Clicks" Daily Activity     Outcome Measure Help from another person eating meals?: Total Help from another person taking care of personal grooming?: Total Help from another person toileting, which includes using toliet, bedpan, or urinal?: Total Help from another person bathing (including  washing, rinsing, drying)?: Total Help from another person to put on and taking off regular upper body clothing?: Total Help from another person to put on and taking off regular lower body clothing?: Total 6 Click Score: 6   End of Session Nurse Communication: Mobility status  Activity Tolerance: Patient limited by lethargy Patient left: in bed;with call bell/phone within reach;with family/visitor present  OT Visit Diagnosis: Muscle weakness (generalized) (M62.81)                Time: 3291-9166 OT Time Calculation (min): 21 min Charges:  OT General Charges $OT Visit: 1 Visit OT Evaluation $OT Eval High Complexity: 1 High  Nestor Lewandowsky, OTR/L Acute Rehabilitation Services Pager: [phone] Office: [phone]   Malka So 06/07/2021, 4:17 PM

## 2021-06-08 ENCOUNTER — Inpatient Hospital Stay (HOSPITAL_COMMUNITY): Payer: Medicare Other

## 2021-06-08 DIAGNOSIS — I4891 Unspecified atrial fibrillation: Secondary | ICD-10-CM | POA: Diagnosis not present

## 2021-06-08 LAB — CBC
HCT: 23.3 % — ABNORMAL LOW (ref 39.0–52.0)
Hemoglobin: 7.1 g/dL — ABNORMAL LOW (ref 13.0–17.0)
MCH: 28.3 pg (ref 26.0–34.0)
MCHC: 30.5 g/dL (ref 30.0–36.0)
MCV: 92.8 fL (ref 80.0–100.0)
Platelets: 236 10*3/uL (ref 150–400)
RBC: 2.51 MIL/uL — ABNORMAL LOW (ref 4.22–5.81)
RDW: 19 % — ABNORMAL HIGH (ref 11.5–15.5)
WBC: 13.5 10*3/uL — ABNORMAL HIGH (ref 4.0–10.5)
nRBC: 0.8 % — ABNORMAL HIGH (ref 0.0–0.2)

## 2021-06-08 LAB — BASIC METABOLIC PANEL
Anion gap: 17 — ABNORMAL HIGH (ref 5–15)
BUN: 108 mg/dL — ABNORMAL HIGH (ref 8–23)
CO2: 22 mmol/L (ref 22–32)
Calcium: 7.6 mg/dL — ABNORMAL LOW (ref 8.9–10.3)
Chloride: 93 mmol/L — ABNORMAL LOW (ref 98–111)
Creatinine, Ser: 7.16 mg/dL — ABNORMAL HIGH (ref 0.61–1.24)
GFR, Estimated: 8 mL/min — ABNORMAL LOW (ref >60)
Glucose, Bld: 324 mg/dL — ABNORMAL HIGH (ref 70–99)
Potassium: 4.4 mmol/L (ref 3.5–5.1)
Sodium: 132 mmol/L — ABNORMAL LOW (ref 135–145)

## 2021-06-08 LAB — GLUCOSE, CAPILLARY
Glucose-Capillary: 186 mg/dL — ABNORMAL HIGH (ref 70–99)
Glucose-Capillary: 217 mg/dL — ABNORMAL HIGH (ref 70–99)
Glucose-Capillary: 223 mg/dL — ABNORMAL HIGH (ref 70–99)
Glucose-Capillary: 243 mg/dL — ABNORMAL HIGH (ref 70–99)
Glucose-Capillary: 247 mg/dL — ABNORMAL HIGH (ref 70–99)
Glucose-Capillary: 291 mg/dL — ABNORMAL HIGH (ref 70–99)
Glucose-Capillary: 300 mg/dL — ABNORMAL HIGH (ref 70–99)

## 2021-06-08 LAB — PHOSPHORUS: Phosphorus: 4.8 mg/dL — ABNORMAL HIGH (ref 2.5–4.6)

## 2021-06-08 LAB — PREPARE RBC (CROSSMATCH)

## 2021-06-08 LAB — MAGNESIUM: Magnesium: 2.5 mg/dL — ABNORMAL HIGH (ref 1.7–2.4)

## 2021-06-08 MED ORDER — LEVOTHYROXINE SODIUM 100 MCG PO TABS
100.0000 ug | ORAL_TABLET | Freq: Every day | ORAL | Status: DC
  Administered 2021-06-08 – 2021-06-13 (×4): 100 ug
  Filled 2021-06-08 (×4): qty 1

## 2021-06-08 MED ORDER — SODIUM CHLORIDE 0.9% IV SOLUTION: Freq: Once | INTRAVENOUS | Status: AC

## 2021-06-08 NOTE — Progress Notes (Signed)
Pt transported to MRI with no complications.

## 2021-06-08 NOTE — Progress Notes (Signed)
NAME:  Richard Hall, MRN:  [id number], DOB:  11-10-1955, LOS: 7 ADMISSION DATE:  05/31/2021, CONSULTATION DATE: 06/04/2021 REFERRING MD: Triad, CHIEF COMPLAINT: Altered mental status  History of Present Illness:  66 year old male with a plethora of health issues was admitted on 05/31/2021 shortness of breath was found to be COVID-19 positive.  He is end-stage renal disease underwent dialysis on 06/04/2020 and was a -2 L.  He had decreased level of consciousness on 06/04/2021 and there was concern for hypercarbia but his PCO2 was 46 on ABGs therefore another source of confusion and altered mental status is being evaluated.  He is requiring noninvasive mechanical ventilatory support at this time.  He is going to CT scan for further evaluation of altered mental status to be transferred to intensive care unit first to be stabilized and then go to CT scan.  He has an extensive past medical history is well-documented below.  Pertinent  Medical History   Past Medical History:  Diagnosis Date   A-V fistula (HCC)    left arm   Anemia    Arthritis    CHF (congestive heart failure) (HCC)    CVA (cerebral vascular accident) ([place]) 2010   Diabetes mellitus with end-stage renal disease ([place])    Dyalisis pt.   Diabetic neuropathy (HCC)    Bilateral feet   Dyslipidemia    Dysrhythmia    A-fib   Essential hypertension    Hepatitis    childhood   History of blood transfusion    Hypothyroidism (acquired)    OSA on CPAP    Prostate cancer [facility])    Uses wheelchair      Significant Hospital Events: Including procedures, antibiotic start and stop dates in addition to other pertinent events   06/04/2021 transferred intensive care unit for altered mental status 1/4 tolerated Bipap overnight, remains somewhat lethargic on nasal cannula, though protecting his airway  Studies 1/3 CTH>No acute intracranial hemorrhage or definite evidence of acute infarction. There are few small vessel infarcts of the basal  ganglia bilaterally, but at least some were present previously.   Micro 1/4 Bcx2> +MSSA 12/30 Covid + 1/4 RVP>negative   Abx Vancomycin 1/4 Cefepime 1/4 Cefazolin 1/5-  Interim History / Subjective:  Mental status has improved. Remains intubated for TEE scheduled for tomorrow.   Objective   Blood pressure (!) 101/59, pulse 74, temperature 99.3 F (37.4 C), temperature source Oral, resp. rate (!) 24, height 6' (1.829 m), weight 116.8 kg, SpO2 100 %.    Vent Mode: PRVC FiO2 (%):  [40 %] 40 % Set Rate:  [24 bmp] 24 bmp Vt Set:  [620 mL] 620 mL PEEP:  [8 cmH20] 8 cmH20 Plateau Pressure:  [19 cmH20-21 cmH20] 19 cmH20   Intake/Output Summary (Last 24 hours) at 06/08/2021 1522 Last data filed at 06/08/2021 1400 Gross per 24 hour  Intake 1744.49 ml  Output 0 ml  Net 1744.49 ml    Filed Weights   06/06/21 1300 06/07/21 0630 06/08/21 0600  Weight: 119.4 kg 115.4 kg 116.8 kg      General:  obese elderly M, intubated and sedated in no acute distress, HEENT: MM pink/moist, sclera anicteric, pupils equal  Neuro:  examined on fentanyl, pt awake and nods to questions CV: s1s2 rrr, no m/r/g PULM:  mechanical vent sounds without significant wheezing and rhonchi, PEEP 10 and 40% FiO2 GI: soft, bsx4 active  Extremities: warm/dry, 1+ edema  Skin: no rashes or lesions   WBC 9.4k Hgb 7.0 Na  133 K 4.1  Resolved Hospital Problem list     Assessment & Plan:    Acute Altered mental status in the setting of COVID-19 infection and pneumonia,  ESRD stage renal disease and MSSA Bacteremia with septic shock Hypoxic Respiratory Failure secondary to Covid-19 pneumonia Baseline OSA Atrial fibrillation with rapid ventricular response Mitral valve calcification/thickening on echo, concern for severe MR Liver enzymes elevated End-stage renal disease Hypothyroidism Diabetes mellitus  Plan:   - NPO at midnight and TEE in am.  - Should be able to wean and extubate thereafter.  -  Continue antibiotics - MRI knee once extubated.    Best Practice (right click and "Reselect all SmartList Selections" daily)   Diet/type: tubefeeds DVT prophylaxis: DOAC GI prophylaxis: PPI Lines: N/A Foley:  N/A Code Status:  full code Last date of multidisciplinary goals of care discussion [Family updated yesterday about intubation, pending today]   CRITICAL CARE Performed by: Kipp Brood   Total critical care time: 32 minutes  Critical care time was exclusive of separately billable procedures and treating other patients.  Critical care was necessary to treat or prevent imminent or life-threatening deterioration.  Critical care was time spent personally by me on the following activities: development of treatment plan with patient and/or surrogate as well as nursing, discussions with consultants, evaluation of patient's response to treatment, examination of patient, obtaining history from patient or surrogate, ordering and performing treatments and interventions, ordering and review of laboratory studies, ordering and review of radiographic studies, pulse oximetry and re-evaluation of patient's condition.    Otilio Carpen Gleason, PA-C [facility] See Amion for pager If no response to pager , please call [phone] until 7pm After 7:00 pm call Elink  [phone]?[place]

## 2021-06-08 NOTE — H&P (View-Only) (Signed)
NAME:  Richard Hall, MRN:  [id number], DOB:  04-28-1956, LOS: 7 ADMISSION DATE:  05/31/2021, CONSULTATION DATE: 06/04/2021 REFERRING MD: Triad, CHIEF COMPLAINT: Altered mental status  History of Present Illness:  66 year old male with a plethora of health issues was admitted on 05/31/2021 shortness of breath was found to be COVID-19 positive.  He is end-stage renal disease underwent dialysis on 06/04/2020 and was a -2 L.  He had decreased level of consciousness on 06/04/2021 and there was concern for hypercarbia but his PCO2 was 46 on ABGs therefore another source of confusion and altered mental status is being evaluated.  He is requiring noninvasive mechanical ventilatory support at this time.  He is going to CT scan for further evaluation of altered mental status to be transferred to intensive care unit first to be stabilized and then go to CT scan.  He has an extensive past medical history is well-documented below.  Pertinent  Medical History   Past Medical History:  Diagnosis Date   A-V fistula (HCC)    left arm   Anemia    Arthritis    CHF (congestive heart failure) (HCC)    CVA (cerebral vascular accident) ([place]) 2010   Diabetes mellitus with end-stage renal disease ([place])    Dyalisis pt.   Diabetic neuropathy (HCC)    Bilateral feet   Dyslipidemia    Dysrhythmia    A-fib   Essential hypertension    Hepatitis    childhood   History of blood transfusion    Hypothyroidism (acquired)    OSA on CPAP    Prostate cancer [facility])    Uses wheelchair      Significant Hospital Events: Including procedures, antibiotic start and stop dates in addition to other pertinent events   06/04/2021 transferred intensive care unit for altered mental status 1/4 tolerated Bipap overnight, remains somewhat lethargic on nasal cannula, though protecting his airway  Studies 1/3 CTH>No acute intracranial hemorrhage or definite evidence of acute infarction. There are few small vessel infarcts of the basal  ganglia bilaterally, but at least some were present previously.   Micro 1/4 Bcx2> +MSSA 12/30 Covid + 1/4 RVP>negative   Abx Vancomycin 1/4 Cefepime 1/4 Cefazolin 1/5-  Interim History / Subjective:  Mental status has improved. Remains intubated for TEE scheduled for tomorrow.   Objective   Blood pressure (!) 101/59, pulse 74, temperature 99.3 F (37.4 C), temperature source Oral, resp. rate (!) 24, height 6' (1.829 m), weight 116.8 kg, SpO2 100 %.    Vent Mode: PRVC FiO2 (%):  [40 %] 40 % Set Rate:  [24 bmp] 24 bmp Vt Set:  [620 mL] 620 mL PEEP:  [8 cmH20] 8 cmH20 Plateau Pressure:  [19 cmH20-21 cmH20] 19 cmH20   Intake/Output Summary (Last 24 hours) at 06/08/2021 1522 Last data filed at 06/08/2021 1400 Gross per 24 hour  Intake 1744.49 ml  Output 0 ml  Net 1744.49 ml    Filed Weights   06/06/21 1300 06/07/21 0630 06/08/21 0600  Weight: 119.4 kg 115.4 kg 116.8 kg      General:  obese elderly M, intubated and sedated in no acute distress, HEENT: MM pink/moist, sclera anicteric, pupils equal  Neuro:  examined on fentanyl, pt awake and nods to questions CV: s1s2 rrr, no m/r/g PULM:  mechanical vent sounds without significant wheezing and rhonchi, PEEP 10 and 40% FiO2 GI: soft, bsx4 active  Extremities: warm/dry, 1+ edema  Skin: no rashes or lesions   WBC 9.4k Hgb 7.0 Na  133 K 4.1  Resolved Hospital Problem list     Assessment & Plan:    Acute Altered mental status in the setting of COVID-19 infection and pneumonia,  ESRD stage renal disease and MSSA Bacteremia with septic shock Hypoxic Respiratory Failure secondary to Covid-19 pneumonia Baseline OSA Atrial fibrillation with rapid ventricular response Mitral valve calcification/thickening on echo, concern for severe MR Liver enzymes elevated End-stage renal disease Hypothyroidism Diabetes mellitus  Plan:   - NPO at midnight and TEE in am.  - Should be able to wean and extubate thereafter.  -  Continue antibiotics - MRI knee once extubated.    Best Practice (right click and "Reselect all SmartList Selections" daily)   Diet/type: tubefeeds DVT prophylaxis: DOAC GI prophylaxis: PPI Lines: N/A Foley:  N/A Code Status:  full code Last date of multidisciplinary goals of care discussion [Family updated yesterday about intubation, pending today]   CRITICAL CARE Performed by: Kipp Brood   Total critical care time: 32 minutes  Critical care time was exclusive of separately billable procedures and treating other patients.  Critical care was necessary to treat or prevent imminent or life-threatening deterioration.  Critical care was time spent personally by me on the following activities: development of treatment plan with patient and/or surrogate as well as nursing, discussions with consultants, evaluation of patient's response to treatment, examination of patient, obtaining history from patient or surrogate, ordering and performing treatments and interventions, ordering and review of laboratory studies, ordering and review of radiographic studies, pulse oximetry and re-evaluation of patient's condition.    Otilio Carpen Gleason, PA-C [facility] See Amion for pager If no response to pager , please call [phone] until 7pm After 7:00 pm call Elink  [phone]?[place]

## 2021-06-08 NOTE — Progress Notes (Signed)
[facility] Progress Note  Name: Richard Hall MRN: [id number] DOB: February 25, 1956  Chief Complaint:  Shortness of breath   Subjective:  last HD 1/5 early am with 0.7 kg UF.  BP limited by UF per nursing report.   He has been on levo at 2 mcg/min.  Question accuracy of weight - 105 kg is far from other weights and not accurate.   Spoke with patient's son and the patient has received blood in the past - we discussed risks/benefits/indications for blood transfusion and he does consent for his father to get the blood.  Another MD also witnessed the phone consent D. Tamala Julian, MD.  Weight a little over 116 kg this am per RN.  Patient was on levo 1 mcg/min on my arrival and RN has just turned off as he has been doing well.   Review of systems:  Unable to obtain 2/2 mechanical ventilation    Intake/Output Summary (Last 24 hours) at 06/08/2021 0621 Last data filed at 06/08/2021 0500 Gross per 24 hour  Intake 1997.74 ml  Output 0 ml  Net 1997.74 ml    Vitals:  Vitals:   06/08/21 0100 06/08/21 0151 06/08/21 0200 06/08/21 0300  BP: 121/63  117/71   Pulse: 81 83 84 83  Resp: (!) 21 (!) 28 (!) 24 (!) 23  Temp:      TempSrc:      SpO2: 97%  100% 100%  Weight:      Height:         Physical Exam:   General adult male in bed intubated HEENT normocephalic atraumatic Neck supple trachea midline Lungs clear breath sounds anteriorly 40% FIO2 and PEEP 8  Heart S1S2 no rub Abdomen soft nontender nondistended/obese habitus Extremities no edema lower extremities Neuro eyes open with exam; on continuous sedation Psych - no active agitation but has mittens Access LUE AVF bruit and thrill   Medications reviewed   Labs:  BMP Latest Ref Rng & Units 06/08/2021 06/07/2021 06/06/2021  Glucose 70 - 99 mg/dL 324(H) 302(H) -  BUN 8 - 23 mg/dL 108(H) 78(H) -  Creatinine 0.61 - 1.24 mg/dL 7.16(H) 5.60(H) -  Sodium 135 - 145 mmol/L 132(L) 133(L) 132(L)  Potassium 3.5 - 5.1 mmol/L 4.4 4.1 3.8   Chloride 98 - 111 mmol/L 93(L) 95(L) -  CO2 22 - 32 mmol/L 22 23 -  Calcium 8.9 - 10.3 mg/dL 7.6(L) 7.9(L) -   Outpatient dialysis orders:  [facility] MWF Outpt prescription: F180, BFR 450, 4hrs, 2 K, 2 Ca, EDW 123kg AVF Meds - mircera 225 mcg every 2 weeks - last received 200 mcg on 05/15/21 and was ordered to increase venofer 100 mg (ordered for 10 doses) Act vit D not ordered  Assessment/Plan:   # ESRD:   - Left below his EDW at 121kg last outpatient tx.  It was felt most of his hypoxia was related to covid.  Expect weight loss with covid though.  Usually HD per MWF schedule - got HD very early am on 1/3.  - HD today.  Then next week will be MWF schedule again   - assess EDW - has been below and not recently weighed accurately    # Encephalopathy  - unclear etiology but had previously improved after bipap per nursing - work-up per primary team - optimize clearance with HD   # Volume/ hypertension:  - EDW 123kg. But left below EDW at 121 last outpatient tx on 12/30.  Weights here do not appear  charted correctly (118 kg charted as pre-HD weight but felt to be post.)  most recent weight is not accurate 105 kg is no where near other weights.      # Anemia of Chronic Kidney Disease: Ferritin 5300 so defer iron. On aranesp 200 mcg weekly on wed. PRBC's x 1 unit today - I have ordered   # Secondary Hyperparathyroidism/Hyperphosphatemia: hyperphos. on [place] and HD. Increased to 2.5 calcium bath here   #Acute hypoxic respiratory failure secondary to COVID-pneumonia:  - vent per pulm - optimize volume with HD  # Covid PNA  Treatment per primary team. On dexamethasone. S/p remdesivir    #Atrial Fibrillation w/ RVR: Management per primary team. S/p amio   #Hyperkalemia: resolved with HD.    Disposition -  in ICU  Claudia Desanctis, MD 06/08/2021 6:41 AM

## 2021-06-09 ENCOUNTER — Inpatient Hospital Stay (HOSPITAL_COMMUNITY): Payer: Medicare Other

## 2021-06-09 DIAGNOSIS — I340 Nonrheumatic mitral (valve) insufficiency: Secondary | ICD-10-CM

## 2021-06-09 DIAGNOSIS — I4891 Unspecified atrial fibrillation: Secondary | ICD-10-CM | POA: Diagnosis not present

## 2021-06-09 LAB — CBC
HCT: 22.9 % — ABNORMAL LOW (ref 39.0–52.0)
Hemoglobin: 7.1 g/dL — ABNORMAL LOW (ref 13.0–17.0)
MCH: 28.9 pg (ref 26.0–34.0)
MCHC: 31 g/dL (ref 30.0–36.0)
MCV: 93.1 fL (ref 80.0–100.0)
Platelets: 204 10*3/uL (ref 150–400)
RBC: 2.46 MIL/uL — ABNORMAL LOW (ref 4.22–5.81)
RDW: 19.4 % — ABNORMAL HIGH (ref 11.5–15.5)
WBC: 10.7 10*3/uL — ABNORMAL HIGH (ref 4.0–10.5)
nRBC: 0.6 % — ABNORMAL HIGH (ref 0.0–0.2)

## 2021-06-09 LAB — COMPREHENSIVE METABOLIC PANEL
ALT: 28 U/L (ref 0–44)
AST: 30 U/L (ref 15–41)
Albumin: 1.8 g/dL — ABNORMAL LOW (ref 3.5–5.0)
Alkaline Phosphatase: 68 U/L (ref 38–126)
Anion gap: 17 — ABNORMAL HIGH (ref 5–15)
BUN: 134 mg/dL — ABNORMAL HIGH (ref 8–23)
CO2: 20 mmol/L — ABNORMAL LOW (ref 22–32)
Calcium: 7.4 mg/dL — ABNORMAL LOW (ref 8.9–10.3)
Chloride: 95 mmol/L — ABNORMAL LOW (ref 98–111)
Creatinine, Ser: 8.88 mg/dL — ABNORMAL HIGH (ref 0.61–1.24)
GFR, Estimated: 6 mL/min — ABNORMAL LOW (ref >60)
Glucose, Bld: 135 mg/dL — ABNORMAL HIGH (ref 70–99)
Potassium: 5.2 mmol/L — ABNORMAL HIGH (ref 3.5–5.1)
Sodium: 132 mmol/L — ABNORMAL LOW (ref 135–145)
Total Bilirubin: 0.8 mg/dL (ref 0.3–1.2)
Total Protein: 5.9 g/dL — ABNORMAL LOW (ref 6.5–8.1)

## 2021-06-09 LAB — GLUCOSE, CAPILLARY
Glucose-Capillary: 144 mg/dL — ABNORMAL HIGH (ref 70–99)
Glucose-Capillary: 105 mg/dL — ABNORMAL HIGH (ref 70–99)
Glucose-Capillary: 132 mg/dL — ABNORMAL HIGH (ref 70–99)
Glucose-Capillary: 198 mg/dL — ABNORMAL HIGH (ref 70–99)
Glucose-Capillary: 204 mg/dL — ABNORMAL HIGH (ref 70–99)

## 2021-06-09 LAB — PREPARE RBC (CROSSMATCH)

## 2021-06-09 LAB — MAGNESIUM: Magnesium: 2.7 mg/dL — ABNORMAL HIGH (ref 1.7–2.4)

## 2021-06-09 MED ORDER — SODIUM ZIRCONIUM CYCLOSILICATE 10 G PO PACK
10.0000 g | PACK | Freq: Once | ORAL | Status: DC
  Filled 2021-06-09: qty 1

## 2021-06-09 MED ORDER — IPRATROPIUM-ALBUTEROL 0.5-2.5 (3) MG/3ML IN SOLN: 3.0000 mL | Freq: Four times a day (QID) | RESPIRATORY_TRACT | Status: DC | PRN

## 2021-06-09 MED ORDER — MIDAZOLAM HCL 2 MG/2ML IJ SOLN: 6.0000 mg | Freq: Once | INTRAMUSCULAR | Status: AC

## 2021-06-09 MED ORDER — SODIUM CHLORIDE 0.9% IV SOLUTION: Freq: Once | INTRAVENOUS | Status: AC

## 2021-06-09 MED ORDER — DEXAMETHASONE SODIUM PHOSPHATE 4 MG/ML IJ SOLN
4.0000 mg | INTRAMUSCULAR | Status: DC
  Administered 2021-06-09 – 2021-06-10 (×2): 4 mg via INTRAVENOUS
  Filled 2021-06-09 (×2): qty 1

## 2021-06-09 MED ORDER — MIDAZOLAM HCL 2 MG/2ML IJ SOLN
INTRAMUSCULAR | Status: AC
  Administered 2021-06-09: 6 mg via INTRAVENOUS
  Filled 2021-06-09: qty 6

## 2021-06-09 NOTE — Interval H&P Note (Signed)
History and Physical Interval Note:  06/09/2021 1:41 PM  Richard Hall  has presented today for surgery, with the diagnosis of bacteremia.  The various methods of treatment have been discussed with the patient and family. After consideration of risks, benefits and other options for treatment, the patient has consented to transesophageal echocardiogram as a surgical intervention.  The patient's history has been reviewed, patient examined, no change in status, stable for surgery.  I have reviewed the patient's chart and labs.  Questions were answered to the patient's satisfaction.     Glori Bickers MD

## 2021-06-09 NOTE — Progress Notes (Signed)
Extubation Procedure Note  Patient Details:   Name: Richard Hall DOB: 01/31/1956 MRN: [id number]   Airway Documentation:    Vent end date: (not recorded) Vent end time: (not recorded)   Evaluation  O2 sats: stable throughout Complications: No apparent complications Patient did tolerate procedure well. Bilateral Breath Sounds: Clear, Diminished   Yes  Keyira Mondesir Arrie Senate 06/09/2021, 6:23 PM

## 2021-06-09 NOTE — CV Procedure (Signed)
° ° °  TRANSESOPHAGEAL ECHOCARDIOGRAM   NAME:  Richard Hall   MRN: [id number] DOB:  03/31/1956   ADMIT DATE: 05/31/2021  INDICATIONS:  Bacteremia  PROCEDURE:   Informed consent was obtained prior to the procedure from family as patient was intubated and sedated. The risks, benefits and alternatives for the procedure were discussed and the patient comprehended these risks.  Risks include, but are not limited to, cough, sore throat, vomiting, nausea, somnolence, esophageal and stomach trauma or perforation, bleeding, low blood pressure, aspiration, pneumonia, infection, trauma to the teeth and death.    The patient was already on the vent. After a procedural time-out, the patient was given a total of 6 mg versed and 100 mcg fentanyl for moderate sedation.   The transesophageal probe was inserted in the esophagus and stomach without difficulty and multiple views were obtained.    COMPLICATIONS:    There were no immediate complications.  FINDINGS:  LEFT VENTRICLE: EF = 60-65%. No regional wall motion abnormalities.  RIGHT VENTRICLE: Normal size and function.   LEFT ATRIUM: Moderate to severely dilated  LEFT ATRIAL APPENDAGE: Occluded with Watchman. No vegetation on device.   RIGHT ATRIUM: Moderately dilated  AORTIC VALVE:  Trileaflet.  Moderately calcified. No significant AS. Trivial AI. No vegetation.   MITRAL VALVE:    Moderate MAC. Moderate to severe central MR. No significant MS. No vegetation. No flow reversal in pulmonary veins.  TRICUSPID VALVE: Normal. Mild TR. No vegetation  PULMONIC VALVE: Grossly normal. Trivial PI. No vegetation.   INTERATRIAL SEPTUM: Not well visualized  PERICARDIUM: No effusion  DESCENDING AORTA: Mild plaque   CONCLUSION:  No TEE evidence of endocarditis.   Glori Bickers, MD  1:47 PM  Richard Hall 1:43 PM

## 2021-06-09 NOTE — Progress Notes (Signed)
[facility] Progress Note  Name: Richard Hall MRN: [id number] DOB: 06/25/1955  Chief Complaint:  Shortness of breath   Subjective:  last HD 1/5 early am with 0.7 kg UF.  He didn't get HD yesterday apparently - likely due to staffing.  Per RN he is to get a TEE today. He is not on any levo.  He did get PRBC's yesterday but no response.   Review of systems:  Unable to obtain 2/2 mechanical ventilation    Intake/Output Summary (Last 24 hours) at 06/09/2021 0622 Last data filed at 06/09/2021 0400 Gross per 24 hour  Intake 816.62 ml  Output 0 ml  Net 816.62 ml    Vitals:  Vitals:   06/09/21 0331 06/09/21 0400 06/09/21 0407 06/09/21 0500  BP: (!) 91/55 96/66  99/62  Pulse: 66 62  66  Resp: (!) 25 (!) 24  (!) 23  Temp: 97.8 F (36.6 C)     TempSrc: Oral     SpO2: 100% 100%  100%  Weight:   117.5 kg   Height:         Physical Exam:   General adult male in bed intubated HEENT normocephalic atraumatic Neck supple trachea midline Lungs clear breath sounds anteriorly 40% FIO2 and PEEP 5  Heart S1S2 no rub Abdomen soft nontender nondistended/obese habitus Extremities no edema lower extremities Neuro tracks visually and interactive; on continuous sedation Psych - no agitation  Access LUE AVF bruit and thrill   Medications reviewed   Labs:  BMP Latest Ref Rng & Units 06/09/2021 06/08/2021 06/07/2021  Glucose 70 - 99 mg/dL 135(H) 324(H) 302(H)  BUN 8 - 23 mg/dL 134(H) 108(H) 78(H)  Creatinine 0.61 - 1.24 mg/dL 8.88(H) 7.16(H) 5.60(H)  Sodium 135 - 145 mmol/L 132(L) 132(L) 133(L)  Potassium 3.5 - 5.1 mmol/L 5.2(H) 4.4 4.1  Chloride 98 - 111 mmol/L 95(L) 93(L) 95(L)  CO2 22 - 32 mmol/L 20(L) 22 23  Calcium 8.9 - 10.3 mg/dL 7.4(L) 7.6(L) 7.9(L)   Outpatient dialysis orders:   MWF Outpt prescription: F180, BFR 450, 4hrs, 2 K, 2 Ca, EDW 123kg AVF Meds - mircera 225 mcg every 2 weeks - last received 200 mcg on 05/15/21 and was ordered to increase venofer  100 mg (ordered for 10 doses) Act vit D not ordered  Assessment/Plan:   # ESRD:   - Left below his EDW at 121kg last outpatient tx.  It was felt most of his hypoxia was related to covid.  Expect weight loss with covid though.  Usually HD per MWF  - HD today (as a rollover from planned 1/7 treatment).  Plan for likely HD on 1/10 off of his regular schedule   - assess EDW - consider 117-118 kg - at his lowest here he may have been a bit too low or weight may have been inaccurate - lokelma once today   # Encephalopathy  - unclear etiology but had previously improved after bipap per nursing - work-up per primary team - optimize clearance with HD   # Volume/ hypertension:  - EDW 123kg. But left below EDW at 121 last outpatient tx on 12/30.  Weights here do not appear charted correctly (118 kg charted as pre-HD weight but felt to be post.)  most recent weight is not accurate 105 kg is no where near other weights.    - noted he is now on midodrine    # Anemia of Chronic Kidney Disease: Ferritin 5300 so defer iron. On aranesp 200  mcg weekly on wed. PRBC's x 1 unit on 1/7.  No response.  Plan for PRBC's on 1/8 as well.    # Secondary Hyperparathyroidism/Hyperphosphatemia: hyperphos. on [place] and HD. Increased to 2.5 calcium bath here   #Acute hypoxic respiratory failure secondary to COVID-pneumonia:  - vent per pulm - optimize volume with HD  # Covid PNA  - Treatment per primary team. On dexamethasone. S/p remdesivir    #Atrial Fibrillation w/ RVR: Management per primary team. S/p amio   #Hyperkalemia: resolved with HD.    Disposition -  in ICU  Claudia Desanctis, MD 06/09/2021 6:41 AM

## 2021-06-09 NOTE — Progress Notes (Signed)
Cortrak pulled back and kinked post-TEE. Per Tamala Julian, MD remove cortrak.

## 2021-06-09 NOTE — Progress Notes (Signed)
°  Echocardiogram Echocardiogram Transesophageal has been performed.  Richard Hall 06/09/2021, 1:55 PM

## 2021-06-09 NOTE — Progress Notes (Signed)
NAME:  Richard Hall, MRN:  [id number], DOB:  21-Sep-1955, LOS: 8 ADMISSION DATE:  05/31/2021, CONSULTATION DATE: 06/04/2021 REFERRING MD: [facility], CHIEF COMPLAINT: Altered mental status  History of Present Illness:  66 year old male with a plethora of health issues was admitted on 05/31/2021 shortness of breath was found to be COVID-19 positive.  He is end-stage renal disease underwent dialysis on 06/04/2020 and was a -2 L.  He had decreased level of consciousness on 06/04/2021 and there was concern for hypercarbia but his PCO2 was 46 on ABGs therefore another source of confusion and altered mental status is being evaluated.  He is requiring noninvasive mechanical ventilatory support at this time.  He is going to CT scan for further evaluation of altered mental status to be transferred to intensive care unit first to be stabilized and then go to CT scan.  He has an extensive past medical history is well-documented below.  Pertinent  Medical History   Past Medical History:  Diagnosis Date   A-V fistula (HCC)    left arm   Anemia    Arthritis    CHF (congestive heart failure) (HCC)    CVA (cerebral vascular accident) ([place]) 2010   Diabetes mellitus with end-stage renal disease ([place])    Dyalisis pt.   Diabetic neuropathy (HCC)    Bilateral feet   Dyslipidemia    Dysrhythmia    A-fib   Essential hypertension    Hepatitis    childhood   History of blood transfusion    Hypothyroidism (acquired)    OSA on CPAP    Prostate cancer [facility])    Uses wheelchair      Significant Hospital Events: Including procedures, antibiotic start and stop dates in addition to other pertinent events   06/04/2021 transferred intensive care unit for altered mental status 1/4 tolerated Bipap overnight, remains somewhat lethargic on nasal cannula, though protecting his airway  Studies 1/3 CTH>No acute intracranial hemorrhage or definite evidence of acute infarction. There are few small vessel infarcts of the basal  ganglia bilaterally, but at least some were present previously.   Micro 1/4 Bcx2> +MSSA 12/30 Covid + 1/4 RVP>negative   Abx Vancomycin 1/4 Cefepime 1/4 Cefazolin 1/5-  Interim History / Subjective:  Mental status has improved. Left knee pain improving.   Objective   Blood pressure (!) 100/57, pulse 80, temperature 98 F (36.7 C), temperature source Oral, resp. rate 16, height 6' (1.829 m), weight 118 kg, SpO2 99 %.    Vent Mode: CPAP;PSV FiO2 (%):  [10 %-40 %] 40 % Set Rate:  [20 bmp-24 bmp] 20 bmp Vt Set:  [620 mL] 620 mL PEEP:  [5 cmH20] 5 cmH20 Pressure Support:  [8 cmH20] 8 cmH20 Plateau Pressure:  [16 cmH20-19 cmH20] 16 cmH20   Intake/Output Summary (Last 24 hours) at 06/09/2021 2032 Last data filed at 06/09/2021 1700 Gross per 24 hour  Intake 767.69 ml  Output 2000 ml  Net -1232.31 ml    Filed Weights   06/09/21 0407 06/09/21 1004 06/09/21 1335  Weight: 117.5 kg 120 kg 118 kg      General:  obese elderly M, intubated and sedated in no acute distress, HEENT: MM pink/moist, sclera anicteric, pupils equal  Neuro:  examined on fentanyl, pt awake and nods to questions CV: s1s2 rrr, no m/r/g PULM:  mechanical vent sounds without significant wheezing and rhonchi, PEEP 10 and 40% FiO2 GI: soft, bsx4 active  Extremities: warm/dry, 1+ edema  Skin: no rashes or lesions  Ancillary  tests personally reviewed  TEE shows moderate to severe MR with no evidence of vegetation.  Assessment & Plan:   Acute Altered mental status in the setting of COVID-19 infection and pneumonia,  ESRD stage renal disease and MSSA Bacteremia with septic shock Hypoxic Respiratory Failure secondary to Covid-19 pneumonia Baseline OSA Atrial fibrillation with rapid ventricular response Mitral valve calcification/thickening on echo, concern for severe MR Liver enzymes elevated End-stage renal disease Hypothyroidism Diabetes mellitus  Plan:   - Wean to extubate post TEE  - Continue  antibiotics - MRI knee once extubated.    Best Practice (right click and "Reselect all SmartList Selections" daily)   Diet/type: tubefeeds DVT prophylaxis: DOAC GI prophylaxis: PPI Lines: N/A Foley:  N/A Code Status:  full code Last date of multidisciplinary goals of care discussion [Family updated yesterday about intubation, pending today]   CRITICAL CARE Performed by: Kipp Brood   Total critical care time: 32 minutes  Critical care time was exclusive of separately billable procedures and treating other patients.  Critical care was necessary to treat or prevent imminent or life-threatening deterioration.  Critical care was time spent personally by me on the following activities: development of treatment plan with patient and/or surrogate as well as nursing, discussions with consultants, evaluation of patient's response to treatment, examination of patient, obtaining history from patient or surrogate, ordering and performing treatments and interventions, ordering and review of laboratory studies, ordering and review of radiographic studies, pulse oximetry and re-evaluation of patient's condition.  Kipp Brood, MD [facility] ICU Physician [place]  Pager: [phone] Or Epic Secure Chat After hours: [phone].  06/09/2021, 8:35 PM

## 2021-06-10 ENCOUNTER — Inpatient Hospital Stay (HOSPITAL_COMMUNITY): Payer: Medicare Other

## 2021-06-10 DIAGNOSIS — U071 COVID-19: Secondary | ICD-10-CM | POA: Diagnosis not present

## 2021-06-10 DIAGNOSIS — J9601 Acute respiratory failure with hypoxia: Secondary | ICD-10-CM

## 2021-06-10 DIAGNOSIS — R7881 Bacteremia: Secondary | ICD-10-CM

## 2021-06-10 DIAGNOSIS — B9561 Methicillin susceptible Staphylococcus aureus infection as the cause of diseases classified elsewhere: Secondary | ICD-10-CM

## 2021-06-10 DIAGNOSIS — J1282 Pneumonia due to coronavirus disease 2019: Secondary | ICD-10-CM

## 2021-06-10 DIAGNOSIS — I4891 Unspecified atrial fibrillation: Secondary | ICD-10-CM | POA: Diagnosis not present

## 2021-06-10 LAB — BPAM RBC
Blood Product Expiration Date: 202301222359
Blood Product Expiration Date: 202301282359
ISSUE DATE / TIME: 202301070853
ISSUE DATE / TIME: 202301080832
Unit Type and Rh: 6200
Unit Type and Rh: 6200

## 2021-06-10 LAB — TYPE AND SCREEN
ABO/RH(D): A POS
Antibody Screen: NEGATIVE
Unit division: 0
Unit division: 0

## 2021-06-10 LAB — BASIC METABOLIC PANEL
Anion gap: 14 (ref 5–15)
BUN: 86 mg/dL — ABNORMAL HIGH (ref 8–23)
CO2: 25 mmol/L (ref 22–32)
Calcium: 7.7 mg/dL — ABNORMAL LOW (ref 8.9–10.3)
Chloride: 98 mmol/L (ref 98–111)
Creatinine, Ser: 6.67 mg/dL — ABNORMAL HIGH (ref 0.61–1.24)
GFR, Estimated: 9 mL/min — ABNORMAL LOW (ref >60)
Glucose, Bld: 69 mg/dL — ABNORMAL LOW (ref 70–99)
Potassium: 4.1 mmol/L (ref 3.5–5.1)
Sodium: 137 mmol/L (ref 135–145)

## 2021-06-10 LAB — CBC
HCT: 25.4 % — ABNORMAL LOW (ref 39.0–52.0)
Hemoglobin: 8 g/dL — ABNORMAL LOW (ref 13.0–17.0)
MCH: 28.8 pg (ref 26.0–34.0)
MCHC: 31.5 g/dL (ref 30.0–36.0)
MCV: 91.4 fL (ref 80.0–100.0)
Platelets: 219 10*3/uL (ref 150–400)
RBC: 2.78 MIL/uL — ABNORMAL LOW (ref 4.22–5.81)
RDW: 19.3 % — ABNORMAL HIGH (ref 11.5–15.5)
WBC: 11.7 10*3/uL — ABNORMAL HIGH (ref 4.0–10.5)
nRBC: 0.3 % — ABNORMAL HIGH (ref 0.0–0.2)

## 2021-06-10 LAB — GLUCOSE, CAPILLARY
Glucose-Capillary: 103 mg/dL — ABNORMAL HIGH (ref 70–99)
Glucose-Capillary: 127 mg/dL — ABNORMAL HIGH (ref 70–99)
Glucose-Capillary: 128 mg/dL — ABNORMAL HIGH (ref 70–99)
Glucose-Capillary: 70 mg/dL (ref 70–99)
Glucose-Capillary: 81 mg/dL (ref 70–99)
Glucose-Capillary: 90 mg/dL (ref 70–99)

## 2021-06-10 LAB — MAGNESIUM: Magnesium: 2.6 mg/dL — ABNORMAL HIGH (ref 1.7–2.4)

## 2021-06-10 IMAGING — NM NM BONE WHOLE BODY
2 series · 2 of 2 positions shown · non-contrast
Comparison: CT of the abdomen and pelvis 01/05/2019

CLINICAL DATA: Prostate cancer.

EXAM:
NUCLEAR MEDICINE WHOLE BODY BONE SCAN
TECHNIQUE: Whole body anterior and posterior images were obtained approximately
3 hours after intravenous injection of radiopharmaceutical.
RADIOPHARMACEUTICALS:  21.8 mCi Oechnetium-XXm MDP IV

[Series 1: wbr_bone_40 whole body · 2.66mm/px · 1 of 1 slices shown (1 of 2)]
[im 1/1]
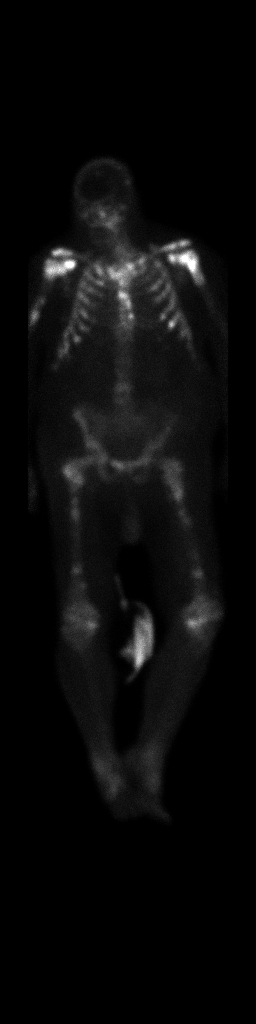

[Series 1: wbr_bone_40 whole body · 2.66mm/px · 1 of 1 slices shown (2 of 2)]
[im 1/1]
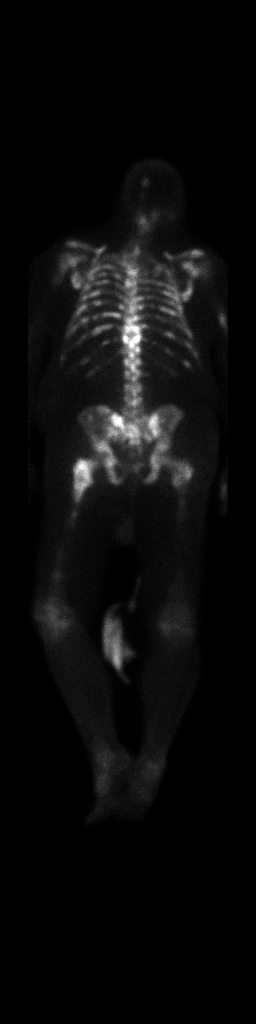

[2 of 2 positions shown; findings below may reference images not displayed]

FINDINGS: Prominent uptake is evident at T10 and T11 consistent with sclerotic
disease noted on the previous CT scan. There is diffuse uptake
throughout the ribs bilaterally. Uptake is present in the scapula
bilaterally. There is uptake in the sacrum and posterior iliac
bones. They are is uptake in the ischial tuberosities bilaterally.
Diffuse uptake is present through both femurs. There is greater
involvement of the left than right trochanter. There is uptake and
proximal femurs bilaterally and clavicles. Uptake is noted in the
left occipital skull.

Degenerative changes are noted at the knees and ankles.
IMPRESSION: 1. Diffuse scattered uptake throughout the axial skeleton assistant
with osseous metastases.
2. The most intense areas of uptake are in the left greater than
right proximal humerus and left greater than right proximal femur.

## 2021-06-10 MED ORDER — MIDODRINE HCL 5 MG PO TABS
5.0000 mg | ORAL_TABLET | Freq: Three times a day (TID) | ORAL | Status: DC
  Administered 2021-06-10 – 2021-06-12 (×5): 5 mg
  Filled 2021-06-10 (×6): qty 1

## 2021-06-10 MED ORDER — VITAL HIGH PROTEIN PO LIQD: 1000.0000 mL | ORAL | Status: DC

## 2021-06-10 MED ORDER — NEPRO/CARBSTEADY PO LIQD
1000.0000 mL | ORAL | Status: DC
  Administered 2021-06-10 – 2021-06-12 (×3): 1000 mL
  Filled 2021-06-10 (×4): qty 1000

## 2021-06-10 MED ORDER — NEPRO/CARBSTEADY PO LIQD
1000.0000 mL | ORAL | Status: DC
Start: 2021-06-10 — End: 2021-06-10

## 2021-06-10 MED ORDER — INSULIN ASPART 100 UNIT/ML IJ SOLN
2.0000 [IU] | INTRAMUSCULAR | Status: DC
  Administered 2021-06-10 – 2021-06-13 (×14): 2 [IU] via SUBCUTANEOUS

## 2021-06-10 MED ORDER — PROSOURCE TF PO LIQD
45.0000 mL | Freq: Two times a day (BID) | ORAL | Status: DC
  Administered 2021-06-10 – 2021-06-13 (×6): 45 mL
  Filled 2021-06-10 (×7): qty 45

## 2021-06-10 NOTE — Progress Notes (Addendum)
[facility] Progress Note  Name: Richard Hall MRN: [id number] DOB: 06/30/1955  Subjective:  2 L off w/ HD yesterday. BP's soft, no pressors, 4 L [place].   Patient not seen directly today given COVID-19 + status, utilizing data taken from chart +/- discussions w/ providers and staff.    Intake/Output Summary (Last 24 hours) at 06/10/2021 1239 Last data filed at 06/09/2021 2300 Gross per 24 hour  Intake 215.66 ml  Output 2000 ml  Net -1784.34 ml     Vitals:  Vitals:   06/10/21 0600 06/10/21 0700 06/10/21 0800 06/10/21 0900  BP: 106/70 104/72 113/71 95/63  Pulse: 78 72 69 71  Resp: 19 (!) 26 (!) 23 18  Temp:   97.8 F (36.6 C)   TempSrc:   Oral   SpO2: 90% 90% 95% 95%  Weight: 113.8 kg     Height:         Physical Exam:    Seen from the door, up in bed having swallow test done, [place] O2, not in distress.    Patient not seen directly today given COVID-19 + status, utilizing data taken from chart +/- discussions w/ providers and staff.    Medications reviewed   Labs:  BMP Latest Ref Rng & Units 06/10/2021 06/09/2021 06/08/2021  Glucose 70 - 99 mg/dL 69(L) 135(H) 324(H)  BUN 8 - 23 mg/dL 86(H) 134(H) 108(H)  Creatinine 0.61 - 1.24 mg/dL 6.67(H) 8.88(H) 7.16(H)  Sodium 135 - 145 mmol/L 137 132(L) 132(L)  Potassium 3.5 - 5.1 mmol/L 4.1 5.2(H) 4.4  Chloride 98 - 111 mmol/L 98 95(L) 93(L)  CO2 22 - 32 mmol/L 25 20(L) 22  Calcium 8.9 - 10.3 mg/dL 7.7(L) 7.4(L) 7.6(L)   OP HD: [facility] MWF  4h  450bfr  123kg  2/2 bath  LU AVF   Hep ?  - mircera 225 mcg every 2 weeks - last received 200 mcg on 12/14  - venofer 100 mg (ordered for 10 doses)  - no vdra   Assessment/Plan:  AHRF/ COVID pna - extubated now. +scattered infiltrates by CXR c/w pna. Improving.  ESRD - Usual HD MWF. Had HD 1/08, yesterday. Next HD 1/10 off schedule. Encephalopathy - d/t acute illness, COVID, esrd. Improved.  BP/ volume - BP's soft, noted he is now on midodrine.  Left below his EDW at 121kg last  outpatient HD, was felt most of his hypoxia on admission was related to covid.  CXR read as patchy infiltrates, no edema and no vol excess on exam. Consider 117-118kg as new edw. Need stand wt.  Anemia ckd -  Ferritin 5300 so defering iron. On aranesp 200 mcg weekly on wed. SP PRBC's on 1/7 and 1/8 MBD ckd - hyperphos. on [place] and HD Covid PNA- treatment per primary team. S/p remdesivir  Atrial Fibrillation w/ RVR - management per primary team. S/p amio    Sol Blazing, MD 06/10/2021 12:39 PM

## 2021-06-10 NOTE — Progress Notes (Signed)
Patient to [place] from [place]. Vital signs obtained. On monitor CCMD notified. CHG bath completed. Alert and oriented to room and call light. Call bell within reach.  Era Bumpers, RN

## 2021-06-10 NOTE — Procedures (Signed)
Cortrak  Person Inserting Tube:  Richard Hall, [name], RD Tube Type:  Cortrak - 43 inches Tube Size:  10 Tube Location:  Left nare Initial Placement:  Stomach Secured by: Bridle Technique Used to Measure Tube Placement:  Marking at nare/corner of mouth Cortrak Secured At:  70 cm  Cortrak Tube Team Note:  Consult received to place a Cortrak feeding tube.   X-ray is required, abdominal x-ray has been ordered by the Cortrak team. Please confirm tube placement before using the Cortrak tube.   If the tube becomes dislodged please keep the tube and contact the Cortrak team at [email or web address] (password [email or web address]) for replacement.  If after hours and replacement cannot be delayed, place a NG tube and confirm placement with an abdominal x-ray.     [name]., MS, RD, LDN (she/her/hers) RD pager number and weekend/on-call pager number located in [place].

## 2021-06-10 NOTE — Progress Notes (Addendum)
Antimicrobial Stewardship Pharmacist Note:  Richard Hall is a 66 year old male with MSSA bacteremia. He has been receiving iHD while inpatient. Etiology of bacteremia is unclear. ID following and planning 6 weeks of IV antibiotics from negative blood cultures on 06/06/21. Patient will be receiving IV antibiotics with dialysis on MWF.  Indication: MSSA bacteremia Regimen: Cefazolin 2g IV M-W-F End Date: 07/18/21  Richard Hall, PharmD PGY2 Infectious Diseases Pharmacy Resident   Please check [email or web address] for unit-specific pharmacy phone numbers

## 2021-06-10 NOTE — Evaluation (Signed)
Clinical/Bedside Swallow Evaluation Patient Details  Name: Richard Hall MRN: [id number] Date of Birth: [birth year]  Today's Date: 06/10/2021 Time: SLP Start Time (ACUTE ONLY): 90 SLP Stop Time (ACUTE ONLY): 0737 SLP Time Calculation (min) (ACUTE ONLY): 24 min  Past Medical History:  Past Medical History:  Diagnosis Date   A-V fistula ([place])    left arm   Anemia    Arthritis    CHF (congestive heart failure) ([place])    CVA (cerebral vascular accident) ([place]) 2010   Diabetes mellitus with end-stage renal disease ([place])    Dyalisis pt.   Diabetic neuropathy (HCC)    Bilateral feet   Dyslipidemia    Dysrhythmia    A-fib   Essential hypertension    Hepatitis    childhood   History of blood transfusion    Hypothyroidism (acquired)    OSA on CPAP    Prostate cancer [facility])    Uses wheelchair    Past Surgical History:  Past Surgical History:  Procedure Laterality Date   APPENDECTOMY     COLONOSCOPY     FEMUR IM NAIL Left 05/20/2019   Procedure: Intramedullary (Im) Retrograde Femoral Nailing for impending pathologic fracture;  Surgeon: Altamese Duchesne, MD;  Location: [facility];  Service: Orthopedics;  Laterality: Left;   ORIF FEMUR FRACTURE Left 05/20/2019   Procedure: OPEN REDUCTION INTERNAL FIXATION (ORIF) DISTAL FEMUR FRACTURE with intercondylar extension;  Surgeon: Altamese Newtown, MD;  Location: [facility];  Service: Orthopedics;  Laterality: Left;   TRANSURETHRAL RESECTION OF BLADDER TUMOR N/A 01/07/2019   Procedure: CYSTOSCOPY/ EVEACUATION CLOT;  Surgeon: Bjorn Loser, MD;  Location: [facility];  Service: Urology;  Laterality: N/A;   TRANSURETHRAL RESECTION OF PROSTATE N/A 07/26/2020   Procedure: TRANSURETHRAL RESECTION OF THE PROSTATE (TURP);  Surgeon: Alexis Frock, MD;  Location: [facility];  Service: Urology;  Laterality: N/A;  93 MINS   HPI:  Pt is a 66 yo male admitted on 12/30 with SOB. Found to have afib with rvr, acute on chronic respiratory failure with hypoxia, chf exacerbation,  and Covid. He was transferred to the ICU on 1/3 due to AMS. CTH 1/3 without acute changes. ETT 1/5-1/8. PMH includes: CVA, CHF, DMII, HTN, anemia, OSA, prostate ca, ESRD on HD    Assessment / Plan / Recommendation  Clinical Impression  Pt has evidence of a post-extubation dysphagia, denying any h/o dysphagia PTA. Today he has a hoarse vocal quality, coughing that is more consistent with thin liquids than with ice chips, and delayed throat clearing with purees, all of which may be concerning for difficulty swallowing as well as decreased airway protection. Prognosis should be good for improved swallow function with increased time off vent, but given current deconditioning and respiratory status, recommend that he remain NPO for now except for a few pieces of ice, given by staff after oral care. After discussion with team, note that plan is for cortrak placement, but if needed, meds could be given crushed in puree. SLP Visit Diagnosis: Dysphagia, unspecified (R13.10)    Aspiration Risk  Moderate aspiration risk    Diet Recommendation NPO;Alternative means - temporary   Medication Administration: Via alternative means (could be given crushed in puree if access if alternative access is lost)    Other  Recommendations Oral Care Recommendations: Oral care QID Other Recommendations: Have oral suction available    Recommendations for follow up therapy are one component of a multi-disciplinary discharge planning process, led by the attending physician.  Recommendations may be updated based on  patient status, additional functional criteria and insurance authorization.  Follow up Recommendations  (tba)      Assistance Recommended at Discharge Intermittent Supervision/Assistance  Functional Status Assessment Patient has had a recent decline in their functional status and demonstrates the ability to make significant improvements in function in a reasonable and predictable amount of time.  Frequency and  Duration min 2x/week  2 weeks       Prognosis Prognosis for Safe Diet Advancement: Good      Swallow Study   General HPI: Pt is a 66 yo male admitted on 12/30 with SOB. Found to have afib with rvr, acute on chronic respiratory failure with hypoxia, chf exacerbation, and Covid. He was transferred to the ICU on 1/3 due to AMS. CTH 1/3 without acute changes. ETT 1/5-1/8. PMH includes: CVA, CHF, DMII, HTN, anemia, OSA, prostate ca, ESRD on HD Type of Study: Bedside Swallow Evaluation Previous Swallow Assessment: none in chart Diet Prior to this Study: NPO Temperature Spikes Noted: No Respiratory Status: Nasal cannula History of Recent Intubation: Yes Length of Intubations (days): 4 days Date extubated: 06/09/21 Behavior/Cognition: Alert;Cooperative Oral Cavity Assessment: Within Functional Limits Oral Care Completed by SLP: No Oral Cavity - Dentition: Missing dentition;Dentures, not available (upper plate not available) Vision: Functional for self-feeding Patient Positioning: Upright in bed Baseline Vocal Quality: Hoarse Volitional Cough: Strong Volitional Swallow: Able to elicit    Oral/Motor/Sensory Function Overall Oral Motor/Sensory Function: Within functional limits   Ice Chips Ice chips: Impaired Presentation: Spoon Pharyngeal Phase Impairments: Cough - Immediate   Thin Liquid Thin Liquid: Impaired Presentation: Spoon;Cup;Self Fed Pharyngeal  Phase Impairments: Cough - Immediate    Nectar Thick Nectar Thick Liquid: Not tested   Honey Thick Honey Thick Liquid: Not tested   Puree Puree: Impaired Presentation: Spoon Pharyngeal Phase Impairments: Throat Clearing - Delayed   Solid     Solid: Not tested      Osie Bond., M.A. [facility] Pager [phone] Office [phone]  06/10/2021,12:45 PM

## 2021-06-10 NOTE — TOC Progression Note (Incomplete)
Transition of Care [facility]) - Progression Note    Patient Details  Name: Richard Hall MRN: [id number] Date of Birth: 1955-06-09  Transition of Care [facility]) CM/SW [place], [place] Phone Number: 06/10/2021, 3:01 PM  Clinical Narrative:     CSW will fax out initial referral close to patient being medically ready for dc.CSW will continue to follow and assist with patients dc planning needs.  Patient currently has cortrak.   Expected Discharge Plan: Skilled Nursing Facility Barriers to Discharge: Continued Medical Work up  Expected Discharge Plan and Services Expected Discharge Plan: [place] In-house Referral: Clinical Social Work     Living arrangements for the past 2 months: Single Family Home                                       Social Determinants of Health (SDOH) Interventions    Readmission Risk Interventions Readmission Risk Prevention Plan 01/12/2019  Transportation Screening Complete  PCP or Specialist Appt within 3-5 Days Complete  HRI or [place] Complete  Social Work Consult for [place] Planning/Counseling Complete  Palliative Care Screening Not Applicable  Medication Review [employer]) Complete  Some recent data might be hidden

## 2021-06-10 NOTE — Progress Notes (Signed)
NAME:  Richard Hall, MRN:  [id number], DOB:  12/05/1955, LOS: 9 ADMISSION DATE:  05/31/2021, CONSULTATION DATE: 06/04/2021 REFERRING MD: [facility], CHIEF COMPLAINT: Altered mental status  History of Present Illness:  66 year old male with a plethora of health issues was admitted on 05/31/2021 shortness of breath was found to be COVID-19 positive.  He had decreased level of consciousness on 06/04/2021 and there was concern for hypercarbia but his PCO2 was 46 on ABGs.  He is requiring noninvasive mechanical ventilatory support at this time.  He is going to CT scan for further evaluation of altered mental status to be transferred to intensive care unit first to be stabilized and then go to CT scan.    Pertinent  Medical History  CHF CVA Type 2 diabetes HTN Anemia Hepatitis OSA on CPAP Prostate cancer  Significant Hospital Events: Including procedures, antibiotic start and stop dates in addition to other pertinent events   06/04/2021 transferred intensive care unit for altered mental status 1/4 tolerated Bipap overnight, remains somewhat lethargic on nasal cannula, though protecting his airway 1/9 no acute events overnight, tolerating extubation  Images 1/3 CTH>No acute intracranial hemorrhage or definite evidence of acute infarction. There are few small vessel infarcts of the basal ganglia bilaterally, but at least some were present previously.  Micro 1/4 Bcx2> +MSSA 12/30 Covid + 1/4 RVP>negative  Abx Vancomycin 1/4 Cefepime 1/4 Cefazolin 1/5-  Interim History / Subjective:  Alert and oriented x3 this morning with no acute complaints Continues to report some mild left knee pain on questioning  Objective   Blood pressure 104/72, pulse 72, temperature 97.8 F (36.6 C), temperature source Oral, resp. rate (!) 26, height 6' (1.829 m), weight 113.8 kg, SpO2 90 %.    Vent Mode: CPAP;PSV FiO2 (%):  [10 %-40 %] 40 % Set Rate:  [20 bmp] 20 bmp Vt Set:  [646 mL] 620 mL PEEP:  [5 cmH20]  5 cmH20 Pressure Support:  [8 cmH20] 8 cmH20 Plateau Pressure:  [16 cmH20-19 cmH20] 16 cmH20   Intake/Output Summary (Last 24 hours) at 06/10/2021 0850 Last data filed at 06/09/2021 2300 Gross per 24 hour  Intake 590.93 ml  Output 2000 ml  Net -1409.07 ml    Filed Weights   06/09/21 1004 06/09/21 1335 06/10/21 0600  Weight: 120 kg 118 kg 113.8 kg   Physical exam General: Acute on chronic deconditioned middle-aged male lying in bed in no acute distress HEENT: [place]/AT, MM pink/moist, PERRL,  Neuro: Alert and oriented x3, nonfocal CV: s1s2 regular rate and rhythm, no murmur, rubs, or gallops,  PULM: Clear to auscultation bilaterally, no increased work of breathing, oxygen saturations mid 90s on 2 L nasal cannula GI: soft, bowel sounds active in all 4 quadrants, non-tender, non-distended, Extremities: warm/dry, no edema  Skin: no rashes or lesions  Resolved problems  Liver enzymes elevated  Assessment & Plan:   Acute Altered mental status in the setting of COVID-19 infection and pneumonia,   P: Management per neurology  Maintain neuro protective measures; goal for eurothermia, euglycemia, eunatermia, normoxia, and PCO2 goal of 35-40 Nutrition and bowel regiment  Seizure precautions  AEDs per neurology  Aspirations precautions   ESRD stage renal disease   -Outpatient HD schedule MWF P: Nephrology following HD session 1/8 Follow renal function  Monitor urine output Trend Bmet Avoid nephrotoxins, ensure adequate renal perfusion   MSSA Bacteremia with septic shock -Blood cultures 1/4 positive for MSSA resistant to erythromycin and clindamycin -Had TEE completed per Dr. Haroldine Laws 1/8  negative for endocarditis -Source unknown concern for hospital associated however left knee is enlarged and mildly warm to touch but no erythema.  Patient does have history of ORIF to left hip 2020 with persistent fracture line seen on x-ray 1/5 P: ID following Repeat blood cultures remain  negative to date Continue Ancef Will discuss with orthopedic surgery potential for I&D of left knee versus further need for imaging Patient needs follow-up with outpatient orthopedic doctor Handy on discharge Bone consult held with orthopedic 1/7 who recommended continue medical management with no significant concern for obtaining MRI given presence of hardware  Hypoxic Respiratory Failure secondary to Covid-19 pneumonia Baseline OSA -Patient tolerated extubation 1/8 P: Continue supplemental oxygen for sat goal greater than 90 Head of bed elevated 30 degrees. Follow intermittent chest x-ray and ABG.   Ensure adequate pulmonary hygiene  Follow cultures  Labs as able  Atrial fibrillation with rapid ventricular response Moderate to severe MR -TEE 1/8 revealed EF 60 to 65% with no wall motion abnormality, moderate to severe left atrial dilation, and moderate to severe mitral regurg P: Continue anticoagulation with Eliquis Continuous telemetry Monitor intake and output Optimize electrolytes  Diabetes mellitus P: Continue SSI and long-acting insulin CBG every 4 60 CBG goal 140-18  Best Practice (right click and "Reselect all SmartList Selections" daily)   Diet/type: tubefeeds DVT prophylaxis: DOAC GI prophylaxis: PPI Lines: N/A Foley:  N/A Code Status:  full code Last date of multidisciplinary goals of care discussion: Update family daily  Stable to transfer to progressive care   CRITICAL CARE Performed by: Nolyn Eilert D. Harris  Total critical care time: 42 minutes  Critical care time was exclusive of separately billable procedures and treating other patients.  Critical care was necessary to treat or prevent imminent or life-threatening deterioration.  Critical care was time spent personally by me on the following activities: development of treatment plan with patient and/or surrogate as well as nursing, discussions with consultants, evaluation of patient's response to  treatment, examination of patient, obtaining history from patient or surrogate, ordering and performing treatments and interventions, ordering and review of laboratory studies, ordering and review of radiographic studies, pulse oximetry and re-evaluation of patient's condition.  Alix Stowers [name], NP-C [facility] Personal contact information can be found on Amion  06/10/2021, 9:29 AM

## 2021-06-10 NOTE — Progress Notes (Signed)
[facility] for Infectious Disease  Date of Admission:  05/31/2021   Total days of inpatient antibiotics 2  Principal Problem:   Atrial fibrillation with RVR (HCC) Active Problems:   OSA on CPAP   Hypothyroidism (acquired)   Essential hypertension   Dyslipidemia   Diabetes mellitus with end-stage renal disease (HCC)   ESRD (end stage renal disease) on dialysis [facility])   Atrial fibrillation (HCC)   Personal history of transient ischemic attack (TIA), and cerebral infarction without residual deficits   Acute on chronic respiratory failure with hypoxia (HCC)   CHF exacerbation (HCC)   COVID-19 virus infection   Elevated troponin   Chronic obstructive pulmonary disease with acute lower respiratory infection (HCC)   MSSA bacteremia   Encephalopathy acute   Malnutrition of moderate degree   Respiratory failure ([place])          Assessment: 49 YM with ESRD on iHD via left arm fistula, 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 Found to be in Afib with rvr (Hx of Afib SP watchman), CHF exacerbation, COVID infection. Remdesivir was started, but stopped due to elevated LFTs. Hospital course complicated by fever, found to have MSSA bacteremia. ID engaged.    # MSSA bacteremia, suspect hospital associated # Fever-Resolved #Left knee pain-Resolved -Etiology of bacteremia is unclear, old right hand IV site does not look infected.  -He was intubated overnight due to AMS. Prior to this pt's respiratory status was around baseline(he is on 2L at home->3-4L inpatient) as such I do not think his bacteremia  2/2 MSSA pneumonia. In addition, CXR showed only small patchy infiltrates in right perihilar and right lower lung fields.  -CT maxill-facial was unrevealing except for mandibular dental caries.  -He also has a tender left knee in the setting of ORIF distal femur on 05/20/19. Xray left knee did not show acute abnormalities. MRI was not  concerning for infection.  -TTE on 12/31 showed moderate mitral valve leaflet thickening with severe MR. TEE did not show vegetation Recommendations:  -Continue ancef with iHD to complete 6 weeks of antibiotics for MSSA bacteremia from negative blood Cx(EOT 07/17/21). Source of bacteremia is unclear, Given he is an HD pt via AVF. -Follow repeat blood Cx to ensure clearance -Refer to dentistry per dental carries -ID will sign off     #COVID infection #CHF exacerbation #Acute on chronic respiratory failure-improved -Agree on holding remdesivir. Pt's respiratory status  has improved and  almost at baseline.   Microbiology:   Antibiotics: Vancomycin and cefepime 1/04-p Remdesivir 12/30-31     Cultures: Blood 06/05/21 2/2 MSSA   SUBJECTIVE: Intubated, cannot provide history.  Interval: Intubated for airway protection as pt had AMS.  Review of Systems: Review of Systems  All other systems reviewed and are negative.   Scheduled Meds:  apixaban  5 mg Oral BID   chlorhexidine gluconate (MEDLINE KIT)  15 mL Mouth Rinse BID   Chlorhexidine Gluconate Cloth  6 each Topical Q0600   [START ON 06/12/2021] darbepoetin (ARANESP) injection - DIALYSIS  200 mcg Intravenous Q Wed-HD   docusate  100 mg Per Tube BID   feeding supplement (PROSource TF)  45 mL Per Tube BID   insulin aspart  0-20 Units Subcutaneous Q4H   insulin aspart  2 Units Subcutaneous Q4H   levothyroxine  100 mcg Per Tube QAC breakfast   mouth rinse  15 mL Mouth Rinse 10 times per day  midodrine  5 mg Per Tube TID WC   multivitamin  1 tablet Per Tube QHS   polyethylene glycol  17 g Per Tube Daily   sodium chloride flush  3 mL Intravenous Q12H   sodium zirconium cyclosilicate  10 g Oral Once   Continuous Infusions:  sodium chloride 10 mL/hr at 06/06/21 1700    ceFAZolin (ANCEF) IV Stopped (06/09/21 1816)   feeding supplement (NEPRO CARB STEADY)     PRN Meds:.acetaminophen **OR** acetaminophen, ipratropium-albuterol,  metoprolol tartrate Allergies  Allergen Reactions   Gabapentin Swelling    OBJECTIVE: Vitals:   06/10/21 0600 06/10/21 0700 06/10/21 0800 06/10/21 0900  BP: 106/70 104/72 113/71 95/63  Pulse: 78 72 69 71  Resp: 19 (!) 26 (!) 23 18  Temp:   97.8 F (36.6 C)   TempSrc:   Oral   SpO2: 90% 90% 95% 95%  Weight: 113.8 kg     Height:       Body mass index is 34.03 kg/m.  Physical Exam Constitutional:      General: He is not in acute distress.    Appearance: He is normal weight. He is not toxic-appearing.  HENT:     Head: Normocephalic and atraumatic.     Right Ear: External ear normal.     Left Ear: External ear normal.     Nose: No congestion or rhinorrhea.     Mouth/Throat:     Mouth: Mucous membranes are moist.     Pharynx: Oropharynx is clear.  Eyes:     Extraocular Movements: Extraocular movements intact.     Conjunctiva/sclera: Conjunctivae normal.     Pupils: Pupils are equal, round, and reactive to light.  Cardiovascular:     Rate and Rhythm: Normal rate and regular rhythm.     Heart sounds: No murmur heard.   No friction rub. No gallop.  Pulmonary:     Effort: Pulmonary effort is normal.     Breath sounds: Normal breath sounds.  Abdominal:     General: Abdomen is flat. Bowel sounds are normal.     Palpations: Abdomen is soft.  Musculoskeletal:        General: No swelling. Normal range of motion.     Cervical back: Normal range of motion and neck supple.  Skin:    General: Skin is warm and dry.  Neurological:     General: No focal deficit present.     Mental Status: He is oriented to person, place, and time.  Psychiatric:        Mood and Affect: Mood normal.      Lab Results Lab Results  Component Value Date   WBC 11.7 (H) 06/10/2021   HGB 8.0 (L) 06/10/2021   HCT 25.4 (L) 06/10/2021   MCV 91.4 06/10/2021   PLT 219 06/10/2021    Lab Results  Component Value Date   CREATININE 6.67 (H) 06/10/2021   BUN 86 (H) 06/10/2021   NA 137 06/10/2021    K 4.1 06/10/2021   CL 98 06/10/2021   CO2 25 06/10/2021    Lab Results  Component Value Date   ALT 28 06/09/2021   AST 30 06/09/2021   ALKPHOS 68 06/09/2021   BILITOT 0.8 06/09/2021        Laurice Record, [facility] 06/10/2021, 3:17 PM

## 2021-06-10 NOTE — Progress Notes (Signed)
Physical Therapy Treatment Patient Details Name: Richard Hall MRN: [id number] DOB: 05/14/1956 Today's Date: 06/10/2021   History of Present Illness Pt adm 12/30 with SOB. Found to have afib with rvr, acute on chronic respiratory failure with hypoxia, chf exacerbation, and Covid. On 1/3 pt with decr responsiveness and started on bipap and transferred to ICU. Increased AMS and lethargy on 06/06/21, intubated. CT found chronic non healed lt femur fx despite ORIF 2 years ago. Pt made NWB on LLE. PMH - ESRD on HD, CHf, afib, DM, neuropathy, HTN, CVA, metastatic prostate CA, lt femur ORIF.    PT Comments    Pt making progress and much more alert today. Pt is now NWB on LLE for chronic nonhealed femur fx. Pt's balance, strength, and cognition are too poor for pt to mobilize OOB NWB without a lift today. Will work toward improving bed mobility and performing lateral scoot transfer as pt improves.    Recommendations for follow up therapy are one component of a multi-disciplinary discharge planning process, led by the attending physician.  Recommendations may be updated based on patient status, additional functional criteria and insurance authorization.  Follow Up Recommendations  Skilled nursing-short term rehab (<3 hours/day)     Assistance Recommended at Discharge Frequent or constant Supervision/Assistance  Patient can return home with the following Two people to help with walking and/or transfers   Equipment Recommendations  Wheelchair (measurements PT);Wheelchair cushion (measurements PT);Hospital bed;Other (comment) (mechanical lift)    Recommendations for Other Services       Precautions / Restrictions Precautions Precautions: Fall Restrictions Weight Bearing Restrictions: Yes LLE Weight Bearing: Non weight bearing     Mobility  Bed Mobility Overal bed mobility: Needs Assistance Bed Mobility: Rolling;Supine to Sit;Sit to Sidelying Rolling: Min assist   Supine to sit: Max  assist;HOB elevated   Sit to sidelying: Max assist;HOB elevated General bed mobility comments: Assist to bring legs off of bed, elevate trunk into sitting and bring hips to EOB. Assist to lower trunk and bring legs back up into bed.    Transfers                   General transfer comment: Did not attempt. With pt now NWB on LLE will need mechanical lift    Ambulation/Gait                   Stairs             Wheelchair Mobility    Modified Rankin (Stroke Patients Only)       Balance Overall balance assessment: Needs assistance Sitting-balance support: Bilateral upper extremity supported;Feet supported Sitting balance-Leahy Scale: Poor Sitting balance - Comments: Sat EOB x 15 minutes. Initially pt with min guard assist and UE support but as he fatigued increased to min assist for static sitting Postural control: Left lateral lean                                  Cognition Arousal/Alertness: Awake/alert Behavior During Therapy: Flat affect Overall Cognitive Status: Impaired/Different from baseline Area of Impairment: Attention;Memory;Following commands;Awareness;Problem solving;Safety/judgement                   Current Attention Level: Sustained   Following Commands: Follows one step commands with increased time;Follows one step commands inconsistently Safety/Judgement: Decreased awareness of deficits;Decreased awareness of safety Awareness: Intellectual Problem Solving: Slow processing;Decreased initiation;Requires tactile cues;Requires verbal cues;Difficulty  sequencing   Functional Status Assessment: Patient has had a recent decline in their functional status and demonstrates the ability to make significant improvements in function in a reasonable and predictable amount of time.      Exercises      General Comments        Pertinent Vitals/Pain Pain Assessment: No/denies pain    Home Living                           Prior Function            PT Goals (current goals can now be found in the care plan section) Acute Rehab PT Goals Patient Stated Goal: not stated Progress towards PT goals: Progressing toward goals    Frequency    Min 2X/week      PT Plan Current plan remains appropriate    Co-evaluation              AM-PAC PT "6 Clicks" Mobility   Outcome Measure  Help needed turning from your back to your side while in a flat bed without using bedrails?: A Lot Help needed moving from lying on your back to sitting on the side of a flat bed without using bedrails?: A Lot Help needed moving to and from a bed to a chair (including a wheelchair)?: Total Help needed standing up from a chair using your arms (e.g., wheelchair or bedside chair)?: Total Help needed to walk in hospital room?: Total Help needed climbing 3-5 steps with a railing? : Total 6 Click Score: 8    End of Session Equipment Utilized During Treatment: Oxygen Activity Tolerance: Patient limited by fatigue Patient left: in bed;with call bell/phone within reach;with bed alarm set Nurse Communication: Mobility status;Need for lift equipment PT Visit Diagnosis: Other abnormalities of gait and mobility (R26.89)     Time: 1281-1886 PT Time Calculation (min) (ACUTE ONLY): 27 min  Charges:  $Therapeutic Activity: 23-37 mins                     [place] Pager [phone] Office [place] 06/10/2021, 2:02 PM

## 2021-06-10 NOTE — Progress Notes (Signed)
PCCM progress note  Given concern for a left knee swelling and tenderness in the setting of MSSA bacteremia concern for osteomyelitis or septic joint was considered.  Phone consultation held with orthopedic surgery 1/9 with low suspicion for infected joint given image review and clinical history.  Ortho did not feel strongly to obtain MRI knee at this time.  Additionally, clinically unable to safely tolerate MRI imaging given concern for ability to protect airway post extubation.   Orthopedic surgery did recommend nonweightbearing to left lower extremity given nonhealed fracture patient will need to follow-up with orthopedic surgery upon discharge for possible revision of ORIF  Richard Bowditch [name], NP-C [facility] Personal contact information can be found on Amion  06/10/2021, 11:26 AM

## 2021-06-11 DIAGNOSIS — I4891 Unspecified atrial fibrillation: Secondary | ICD-10-CM | POA: Diagnosis not present

## 2021-06-11 LAB — CULTURE, BLOOD (ROUTINE X 2)
Culture: NO GROWTH
Culture: NO GROWTH
Special Requests: ADEQUATE

## 2021-06-11 LAB — GLUCOSE, CAPILLARY
Glucose-Capillary: 109 mg/dL — ABNORMAL HIGH (ref 70–99)
Glucose-Capillary: 127 mg/dL — ABNORMAL HIGH (ref 70–99)
Glucose-Capillary: 133 mg/dL — ABNORMAL HIGH (ref 70–99)
Glucose-Capillary: 145 mg/dL — ABNORMAL HIGH (ref 70–99)
Glucose-Capillary: 208 mg/dL — ABNORMAL HIGH (ref 70–99)

## 2021-06-11 MED ORDER — APIXABAN 5 MG PO TABS
5.0000 mg | ORAL_TABLET | Freq: Two times a day (BID) | ORAL | Status: DC
  Administered 2021-06-11 – 2021-06-13 (×5): 5 mg
  Filled 2021-06-11 (×5): qty 1

## 2021-06-11 NOTE — Progress Notes (Signed)
PROGRESS NOTE    Richard Hall  QJJ:[id number] DOB: January 23, 1956 DOA: 05/31/2021 PCP: [name], MD    Brief Narrative:  66 year old male with history of CHF, CVA, type 2 diabetes, hypertension, hepatitis, OSA on CPAP, prostate cancer  was admitted on 05/31/2021 shortness of breath was found to be COVID-19 positive.  He had decreased level of consciousness on 06/04/2021 and there was concern for hypercarbia but his PCO2 was 46 on ABGs.  He is requiring noninvasive mechanical ventilatory support at this time.  He is going to CT scan for further evaluation of altered mental status to be transferred to intensive care unit first to be stabilized and then go to CT scan.   CT found with No acute intracranial hemorrhage or definite evidence of acute infarction. There are few small vessel infarcts of the basal ganglia bilaterally, but at least some were present previously.   06/04/2021 transferred intensive care unit for altered mental status 1/4 tolerated Bipap overnight, remains somewhat lethargic on nasal cannula, though protecting his airway 1/9 no acute events overnight, tolerating extubation  1/10 TRH pickup  Consultants:  PCCM, ID, nephrology  Procedures:   Antimicrobials:  1/4 Bcx2> +MSSA 12/30 Covid + 1/4 RVP>negative   Abx Vancomycin 1/4 Cefepime 1/4 Cefazolin 1/5-   Subjective: Has no complaints for me this AM.  Feels no worsening shortness of breath.  No chest pain.  No dizziness.   Objective: Vitals:   06/11/21 0054 06/11/21 0348 06/11/21 0803 06/11/21 1130  BP: 100/74 113/62 (!) 103/57 113/69  Pulse: 74 66 71 68  Resp: $Remo'14 15 17 18  'jmPeW$ Temp:  98.4 F (36.9 C) 97.8 F (36.6 C)   TempSrc:  Oral Oral Oral  SpO2: 100% 98% 97% 99%  Weight:  118.5 kg    Height:        Intake/Output Summary (Last 24 hours) at 06/11/2021 1503 Last data filed at 06/11/2021 0500 Gross per 24 hour  Intake 1424.3 ml  Output 2 ml  Net 1422.3 ml   Filed Weights   06/09/21 1335  06/10/21 0600 06/11/21 0348  Weight: 118 kg 113.8 kg 118.5 kg    Examination:  General exam: Appears calm and comfortable lying in bed Respiratory system: Decreased breath sounds anteriorly no wheezing  Cardiovascular system: S1 & S2 heard, RRR.  Gastrointestinal system: Abdomen is nondistended, soft and nontender. Normal bowel sounds heard. Central nervous system: Alert and alert, grossly intact  Extremities: No edema Psychiatry:  Mood & affect appropriate.     Data Reviewed: I have personally reviewed following labs and imaging studies  CBC: Recent Labs  Lab 06/06/21 0748 06/06/21 1250 06/06/21 1706 06/07/21 0127 06/08/21 0105 06/09/21 0343 06/10/21 0451  WBC 10.0  --   --  9.4 13.5* 10.7* 11.7*  HGB 7.4*   < > 8.8* 7.0* 7.1* 7.1* 8.0*  HCT 23.7*   < > 26.0* 21.6* 23.3* 22.9* 25.4*  MCV 92.6  --   --  90.8 92.8 93.1 91.4  PLT 229  --   --  217 236 204 219   < > = values in this interval not displayed.   Basic Metabolic Panel: Recent Labs  Lab 06/05/21 1646 06/06/21 0606 06/06/21 1250 06/06/21 1651 06/06/21 1706 06/07/21 0127 06/08/21 0105 06/09/21 0343 06/10/21 0451  NA  --  130*   < >  --  132* 133* 132* 132* 137  K  --  4.3   < >  --  3.8 4.1 4.4 5.2* 4.1  CL  --  90*  --   --   --  95* 93* 95* 98  CO2  --  20*  --   --   --  23 22 20* 25  GLUCOSE  --  382*  --   --   --  302* 324* 135* 69*  BUN  --  125*  --   --   --  78* 108* 134* 86*  CREATININE  --  8.80*  --   --   --  5.60* 7.16* 8.88* 6.67*  CALCIUM  --  7.1*  --   --   --  7.9* 7.6* 7.4* 7.7*  MG 2.3 2.6*  --  2.4  --  2.2 2.5* 2.7* 2.6*  PHOS 6.9* 8.2*  --  5.9*  --  5.2* 4.8*  --   --    < > = values in this interval not displayed.   GFR: Estimated Creatinine Clearance: 14.5 mL/min (A) (by C-G formula based on SCr of 6.67 mg/dL (H)). Liver Function Tests: Recent Labs  Lab 06/06/21 0606 06/09/21 0343  AST 72* 30  ALT 183* 28  ALKPHOS 80 68  BILITOT 0.6 0.8  PROT 5.5* 5.9*  ALBUMIN  1.7* 1.8*   No results for input(s): LIPASE, AMYLASE in the last 168 hours. No results for input(s): AMMONIA in the last 168 hours. Coagulation Profile: No results for input(s): INR, PROTIME in the last 168 hours. Cardiac Enzymes: No results for input(s): CKTOTAL, CKMB, CKMBINDEX, TROPONINI in the last 168 hours. BNP (last 3 results) No results for input(s): PROBNP in the last 8760 hours. HbA1C: No results for input(s): HGBA1C in the last 72 hours. CBG: Recent Labs  Lab 06/10/21 2036 06/11/21 0019 06/11/21 0417 06/11/21 0807 06/11/21 1201  GLUCAP 127* 145* 127* 109* 133*   Lipid Profile: No results for input(s): CHOL, HDL, LDLCALC, TRIG, CHOLHDL, LDLDIRECT in the last 72 hours. Thyroid Function Tests: No results for input(s): TSH, T4TOTAL, FREET4, T3FREE, THYROIDAB in the last 72 hours. Anemia Panel: No results for input(s): VITAMINB12, FOLATE, FERRITIN, TIBC, IRON, RETICCTPCT in the last 72 hours. Sepsis Labs: Recent Labs  Lab 06/06/21 0606  LATICACIDVEN 1.3    Recent Results (from the past 240 hour(s))  Culture, blood (routine x 2)     Status: Abnormal   Collection Time: 06/05/21  1:11 AM   Specimen: BLOOD  Result Value Ref Range Status   Specimen Description BLOOD RIGHT ANTECUBITAL  Final   Special Requests   Final    BOTTLES DRAWN AEROBIC ONLY Blood Culture adequate volume   Culture  Setup Time   Final    GRAM POSITIVE COCCI IN CLUSTERS AEROBIC BOTTLE ONLY CRITICAL RESULT CALLED TO, READ BACK BY AND VERIFIED WITH: Sarajane Marek [id number] AT 1442 BY CM Performed at [facility], [place] [address] [place], [place] [address]    Culture STAPHYLOCOCCUS AUREUS (A)  Final   Report Status 06/07/2021 FINAL  Final   Organism ID, Bacteria STAPHYLOCOCCUS AUREUS  Final      Susceptibility   Staphylococcus aureus - MIC*    CIPROFLOXACIN <=0.5 SENSITIVE Sensitive     ERYTHROMYCIN RESISTANT Resistant     GENTAMICIN <=0.5 SENSITIVE Sensitive     OXACILLIN 0.5 SENSITIVE  Sensitive     TETRACYCLINE <=1 SENSITIVE Sensitive     VANCOMYCIN 1 SENSITIVE Sensitive     TRIMETH/SULFA <=10 SENSITIVE Sensitive     CLINDAMYCIN RESISTANT Resistant     RIFAMPIN <=0.5 SENSITIVE  Sensitive     Inducible Clindamycin POSITIVE Resistant     * STAPHYLOCOCCUS AUREUS  Culture, blood (routine x 2)     Status: Abnormal   Collection Time: 06/05/21  1:11 AM   Specimen: BLOOD RIGHT HAND  Result Value Ref Range Status   Specimen Description BLOOD RIGHT HAND  Final   Special Requests   Final    BOTTLES DRAWN AEROBIC ONLY Blood Culture adequate volume   Culture  Setup Time   Final    GRAM POSITIVE COCCI IN CLUSTERS AEROBIC BOTTLE ONLY    Culture (A)  Final    STAPHYLOCOCCUS AUREUS SUSCEPTIBILITIES PERFORMED ON PREVIOUS CULTURE WITHIN THE LAST 5 DAYS. Performed at [facility], [place] [address], [place], [place] [address]    Report Status 06/07/2021 FINAL  Final  Blood Culture ID Panel (Reflexed)     Status: Abnormal   Collection Time: 06/05/21  1:11 AM  Result Value Ref Range Status   Enterococcus faecalis NOT DETECTED NOT DETECTED Final   Enterococcus Faecium NOT DETECTED NOT DETECTED Final   Listeria monocytogenes NOT DETECTED NOT DETECTED Final   Staphylococcus species DETECTED (A) NOT DETECTED Final    Comment: CRITICAL RESULT CALLED TO, READ BACK BY AND VERIFIED WITH: PHARMD J FRENS [id number] AT 1441 BY CM    Staphylococcus aureus (BCID) DETECTED (A) NOT DETECTED Final    Comment: CRITICAL RESULT CALLED TO, READ BACK BY AND VERIFIED WITH: PHARMD J FRENS [id number] AT 1441 BY CM    Staphylococcus epidermidis NOT DETECTED NOT DETECTED Final   Staphylococcus lugdunensis NOT DETECTED NOT DETECTED Final   Streptococcus species NOT DETECTED NOT DETECTED Final   Streptococcus agalactiae NOT DETECTED NOT DETECTED Final   Streptococcus pneumoniae NOT DETECTED NOT DETECTED Final   Streptococcus pyogenes NOT DETECTED NOT DETECTED Final   A.calcoaceticus-baumannii NOT DETECTED  NOT DETECTED Final   Bacteroides fragilis NOT DETECTED NOT DETECTED Final   Enterobacterales NOT DETECTED NOT DETECTED Final   Enterobacter cloacae complex NOT DETECTED NOT DETECTED Final   Escherichia coli NOT DETECTED NOT DETECTED Final   Klebsiella aerogenes NOT DETECTED NOT DETECTED Final   Klebsiella oxytoca NOT DETECTED NOT DETECTED Final   Klebsiella pneumoniae NOT DETECTED NOT DETECTED Final   Proteus species NOT DETECTED NOT DETECTED Final   Salmonella species NOT DETECTED NOT DETECTED Final   Serratia marcescens NOT DETECTED NOT DETECTED Final   Haemophilus influenzae NOT DETECTED NOT DETECTED Final   Neisseria meningitidis NOT DETECTED NOT DETECTED Final   Pseudomonas aeruginosa NOT DETECTED NOT DETECTED Final   Stenotrophomonas maltophilia NOT DETECTED NOT DETECTED Final   Candida albicans NOT DETECTED NOT DETECTED Final   Candida auris NOT DETECTED NOT DETECTED Final   Candida glabrata NOT DETECTED NOT DETECTED Final   Candida krusei NOT DETECTED NOT DETECTED Final   Candida parapsilosis NOT DETECTED NOT DETECTED Final   Candida tropicalis NOT DETECTED NOT DETECTED Final   Cryptococcus neoformans/gattii NOT DETECTED NOT DETECTED Final   Meth resistant mecA/C and MREJ NOT DETECTED NOT DETECTED Final    Comment: Performed at [facility], [address], [place],  [address]  Respiratory (~20 pathogens) panel by PCR     Status: None   Collection Time: 06/05/21  6:00 PM   Specimen: Nasopharyngeal Swab; Respiratory  Result Value Ref Range Status   Adenovirus NOT DETECTED NOT DETECTED Final   Coronavirus 229E NOT DETECTED NOT DETECTED Final    Comment: (NOTE) The Coronavirus on the Respiratory Panel, DOES  NOT test for the novel  Coronavirus (2019 nCoV)    Coronavirus HKU1 NOT DETECTED NOT DETECTED Final   Coronavirus NL63 NOT DETECTED NOT DETECTED Final   Coronavirus OC43 NOT DETECTED NOT DETECTED Final   Metapneumovirus NOT DETECTED NOT DETECTED Final    Rhinovirus / Enterovirus NOT DETECTED NOT DETECTED Final   Influenza A NOT DETECTED NOT DETECTED Final   Influenza B NOT DETECTED NOT DETECTED Final   Parainfluenza Virus 1 NOT DETECTED NOT DETECTED Final   Parainfluenza Virus 2 NOT DETECTED NOT DETECTED Final   Parainfluenza Virus 3 NOT DETECTED NOT DETECTED Final   Parainfluenza Virus 4 NOT DETECTED NOT DETECTED Final   Respiratory Syncytial Virus NOT DETECTED NOT DETECTED Final   Bordetella pertussis NOT DETECTED NOT DETECTED Final   Bordetella Parapertussis NOT DETECTED NOT DETECTED Final   Chlamydophila pneumoniae NOT DETECTED NOT DETECTED Final   Mycoplasma pneumoniae NOT DETECTED NOT DETECTED Final    Comment: Performed at [facility], [place] [address] [place], [place] [address]  Culture, blood (routine x 2)     Status: None   Collection Time: 06/06/21 10:30 AM   Specimen: BLOOD RIGHT WRIST  Result Value Ref Range Status   Specimen Description BLOOD RIGHT WRIST  Final   Special Requests   Final    BOTTLES DRAWN AEROBIC AND ANAEROBIC Blood Culture adequate volume   Culture   Final    NO GROWTH 5 DAYS Performed at [facility], [place] [address] [place], [place] [address]    Report Status 06/11/2021 FINAL  Final  Culture, blood (routine x 2)     Status: None   Collection Time: 06/06/21 10:45 AM   Specimen: BLOOD RIGHT HAND  Result Value Ref Range Status   Specimen Description BLOOD RIGHT HAND  Final   Special Requests   Final    BOTTLES DRAWN AEROBIC AND ANAEROBIC Blood Culture results may not be optimal due to an excessive volume of blood received in culture bottles   Culture   Final    NO GROWTH 5 DAYS Performed at [facility], [place] [address] [place], [place] [address]    Report Status 06/11/2021 FINAL  Final         Radiology Studies: DG Abd Portable 1V  Result Date: 06/10/2021 CLINICAL DATA:  Feeding tube placement EXAM: PORTABLE ABDOMEN - 1 VIEW COMPARISON:  Portable exam 1416 hours compared to  06/09/2021 FINDINGS: Feeding tube extends into the proximal stomach. Normal bowel gas pattern. Bones demineralized. IMPRESSION: Feeding tube extends into proximal stomach. Electronically Signed   By: Lavonia Dana M.D.   On: 06/10/2021 14:36   DG Abd Portable 1V  Result Date: 06/09/2021 CLINICAL DATA:  Placement of gastrointestinal device EXAM: PORTABLE ABDOMEN - 1 VIEW COMPARISON:  06/05/2021 FINDINGS: Enteric tube tip overlies the proximal stomach, it is retracted since 06/05/2021. Probable airspace disease at the left base. IMPRESSION: Esophageal tube tip overlies the proximal stomach Electronically Signed   By: Donavan Foil M.D.   On: 06/09/2021 16:52        Scheduled Meds:  apixaban  5 mg Per Tube BID   chlorhexidine gluconate (MEDLINE KIT)  15 mL Mouth Rinse BID   Chlorhexidine Gluconate Cloth  6 each Topical Q0600   [START ON 06/12/2021] darbepoetin (ARANESP) injection - DIALYSIS  200 mcg Intravenous Q Wed-HD   docusate  100 mg Per Tube BID   feeding supplement (PROSource TF)  45 mL Per Tube BID  insulin aspart  0-20 Units Subcutaneous Q4H   insulin aspart  2 Units Subcutaneous Q4H   levothyroxine  100 mcg Per Tube QAC breakfast   mouth rinse  15 mL Mouth Rinse 10 times per day   midodrine  5 mg Per Tube TID WC   multivitamin  1 tablet Per Tube QHS   polyethylene glycol  17 g Per Tube Daily   sodium chloride flush  3 mL Intravenous Q12H   sodium zirconium cyclosilicate  10 g Oral Once   Continuous Infusions:  sodium chloride 10 mL/hr at 06/06/21 1700    ceFAZolin (ANCEF) IV 1 g (06/10/21 2043)   feeding supplement (NEPRO CARB STEADY) 50 mL/hr at 06/11/21 1000    Assessment & Plan:   Principal Problem:   Atrial fibrillation with RVR ([place]) Active Problems:   OSA on CPAP   Hypothyroidism (acquired)   Essential hypertension   Dyslipidemia   Diabetes mellitus with end-stage renal disease (HCC)   ESRD (end stage renal disease) on dialysis [facility])   Atrial fibrillation (HCC)    Personal history of transient ischemic attack (TIA), and cerebral infarction without residual deficits   Acute on chronic respiratory failure with hypoxia (HCC)   CHF exacerbation ([place])   COVID-19 virus infection   Elevated troponin   Chronic obstructive pulmonary disease with acute lower respiratory infection (HCC)   MSSA bacteremia   Encephalopathy acute   Malnutrition of moderate degree   Respiratory failure (HCC)   Acute Altered mental status in the setting of COVID-19 infection and pneumonia,   Seizure precautions  AEDs per neurology  1/10 aspiration precaution Has improved    ESRD stage renal disease   -Outpatient HD schedule MWF 1/10 nephrology following HD today Darbepoetin during dialysis    MSSA Bacteremia with septic shock -Blood cultures 1/4 positive for MSSA resistant to erythromycin and clindamycin -Had TEE completed per Dr. Haroldine Laws 1/8 negative for endocarditis -Source unknown concern for hospital associated however left knee is enlarged and mildly warm to touch but no erythema.  Patient does have history of ORIF to left hip 2020 with persistent fracture line seen on x-ray 1/5 TCM discussed with orthopedic surgery potential for I&D of left knee versus further need for imaging Patient needs follow-up with outpatient orthopedic Dr Marcelino Scot on discharge Bone consult held with orthopedic 1/7 who recommended continue medical management with no significant concern for obtaining MRI given presence of hardware 1/10 ID following Recommend continuing Ancef with HD to complete 6 weeks of antibiotics for MSSA bacteremia from negative blood cultures.  Source of bacteremia still unclear.   Refer to dentistry for dental caries ID signed off     Acute hypoxic Respiratory Failure secondary to Covid-19 pneumonia Baseline OSA Status post extubation on 1/8 Continue supplemental oxygen with goal greater than 92% Keep head of bed elevated 30 degrees SLP evaluation for possible  risk of aspiration aspiration precautions 10 days of steroid therapy Remdesivir for 2 days and stopped There was concern for bacterial pneumonia but there was no cultures to confirm   Atrial fibrillation with rapid ventricular response Moderate to severe MR -TEE 1/8 revealed EF 60 to 65% with no wall motion abnormality, moderate to severe left atrial dilation, and moderate to severe mitral regurg 1/10 continue Eliquis     Diabetes mellitus BG stable Continue R-ISS and long-acting    Nutrition: ON TF    DVT prophylaxis: Eliquis Code Status: Full Family Communication: None at bedside Disposition Plan:  Status is: Inpatient  Remains inpatient appropriate because: IV treatment.            LOS: 10 days   Time spent: 35 minutes with more than 50% on Portal, MD [facility] Pager [phone]  If 7PM-7AM, please contact night-coverage 06/11/2021, 3:03 PM

## 2021-06-11 NOTE — Progress Notes (Signed)
Speech Language Pathology Treatment: Dysphagia  Patient Details Name: Richard Hall MRN: [id number] DOB: [birth year] Today's Date: 06/11/2021 Time: 1345-1400 SLP Time Calculation (min) (ACUTE ONLY): 15 min  Assessment / Plan / Recommendation Clinical Impression  Pt's vocal quality is improved from previous date and he has significantly less coughing during PO trials today. He coughed only once despite larger volumes tested, and it was quite delayed after intake, making it more suspected that it might not be related to swallowing. Pt says he does not have his upper dentures at the hospital and he only wants to have softer foods until he can get to them. He does have prolonged mastication and oral residue noted with bites of graham cracker. Pt is agreeable to starting with Dys 2 (finely chopped) diet and thin liquids. SLP to follow up and progress as able.   HPI HPI: Pt is a 66 yo male admitted on 12/30 with SOB. Found to have afib with rvr, acute on chronic respiratory failure with hypoxia, chf exacerbation, and Covid. He was transferred to the ICU on 1/3 due to AMS. CTH 1/3 without acute changes. ETT 1/5-1/8. PMH includes: CVA, CHF, DMII, HTN, anemia, OSA, prostate ca, ESRD on HD      SLP Plan  Continue with current plan of care      Recommendations for follow up therapy are one component of a multi-disciplinary discharge planning process, led by the attending physician.  Recommendations may be updated based on patient status, additional functional criteria and insurance authorization.    Recommendations  Diet recommendations: Dysphagia 2 (fine chop);Thin liquid Liquids provided via: Cup;Straw Medication Administration: Whole meds with puree Supervision: Patient able to self feed;Intermittent supervision to cue for compensatory strategies Compensations: Slow rate;Small sips/bites Postural Changes and/or Swallow Maneuvers: Seated upright 90 degrees                Oral Care  Recommendations: Oral care BID Follow Up Recommendations:  (tba) Assistance recommended at discharge: Intermittent Supervision/Assistance SLP Visit Diagnosis: Dysphagia, unspecified (R13.10) Plan: Continue with current plan of care           Osie Bond., M.A. [place] [facility] Pager [phone] Office [phone]  06/11/2021, 3:10 PM

## 2021-06-11 NOTE — Progress Notes (Addendum)
Pt requested some food and water. We let him tried to drink water from a straw with HOB up 45 degrees. Pt presented no signs and symptoms of difficulties swallowing. We will hand off to a day shift team for a speech therapist to re-evaluate swallowing test for advanced oral intake. Tube feeding Nebpro Carb Steady titrated as tolerated q 8 hrs, max rate at 50 ml/hr. Currently at 40 ml/hr. Monitor blood glucose q 4 hrs.   He is hemodynamically stable, afebrile, on 2 LPM of O2 NCL, no respiratory distress. SPO2 100%. Atrial fib on the monitor, HR 60s-70s. He was able to rest well. Denied any pain.   Pt reported had one BM with black tardy stool tonight. Pt reported he had previous positive occult blood. Last test stool occult blood was seen 01/05/2019. We will monitor.   Kennyth Lose, RN

## 2021-06-11 NOTE — Plan of Care (Signed)

## 2021-06-11 NOTE — Progress Notes (Signed)
SLP Cancellation Note  Patient Details Name: Richard Hall MRN: [id number] DOB: 09-09-1955   Cancelled treatment:       Reason Eval/Treat Not Completed: Fatigue/lethargy limiting ability to participate. Attempted to see pt this am for ongoing swallowing tx, but he was not alert enough per RN. She tried to wake him again but he was not alert enough for PO trials at this time. Will try to f/u as able - note that he is also scheduled to go to dialysis at some point today.     Osie Bond., M.A. [place] [facility] Pager [phone] Office [phone]  06/11/2021, 10:31 AM

## 2021-06-11 NOTE — Progress Notes (Signed)
Nutrition Follow-up  DOCUMENTATION CODES:   Non-severe (moderate) malnutrition in context of chronic illness  INTERVENTION:   Continue TF via Cortrak tube: Nepro at 50 ml/h (1200 ml per day) Prosource TF 45 ml BID  Provides 2240 kcal, 119 gm protein, 872 ml free water daily.  Renal MVI daily via tube.  RD to monitor adequacy of oral intake and adjust TF regimen as appropriate.   NUTRITION DIAGNOSIS:   Moderate Malnutrition related to chronic illness (ESRD/HD, CHF) as evidenced by mild muscle depletion, mild fat depletion.  Ongoing   GOAL:   Patient will meet greater than or equal to 90% of their needs  Met with TF  MONITOR:   TF tolerance, Labs, Diet advancement, Weight trends  REASON FOR ASSESSMENT:   Rounds (Cortrak, ESRD)    ASSESSMENT:   66 yo male admitted with 12/30 with SOB with +COVID-19 infection. Pt with decreased level of consciousness on 1/03 and transferred to ICU. PMH includes DM, ESRD/HD, CHF, CVA, HTN, hepatitis, hx prostate cancer. Per MD note, pt utilizes wheelchair  Patient was extubated on 1/8.  Cortrak was removed 1/8, replaced 1/9. TF has been resumed: Nepro at 50 ml/h with Prosource TF 45 ml BID to provide 2240 kcal, 119 gm protein, 872 ml free water daily.  Patient is tolerating TF well.   SLP following. Diet was just advanced to dysphagia 2 with thin liquids.  Labs reviewed. Mag 2.6 (1/9) CBG: 145-127-109-133  Medications reviewed and include Aranesp, Novolog, Rena-vit, Miralax, Lokelma.  New EDW 117-118 kg Current weight 118.5 kg  Last HD 1/8, Net UF 2 L Next HD scheduled 1/10.  Diet Order:   Diet Order             DIET DYS 2 Room service appropriate? Yes; Fluid consistency: Thin  Diet effective now                   EDUCATION NEEDS:   Not appropriate for education at this time  Skin:  Skin Assessment: Reviewed RN Assessment  Last BM:  1/10 type 6  Height:   Ht Readings from Last 1 Encounters:  06/06/21 6'  (1.829 m)    Weight:   Wt Readings from Last 1 Encounters:  06/11/21 118.5 kg    BMI:  Body mass index is 35.43 kg/m.  Estimated Nutritional Needs:   Kcal:  2200-2400 kcals  Protein:  122-146 g  Fluid:  1000 ml plus UOP   Lucas Mallow RD, LDN, CNSC Please refer to Amion for contact information.

## 2021-06-11 NOTE — Progress Notes (Signed)
[facility] Progress Note  Name: Linwood Gullikson MRN: [id number] DOB: 1955/08/26  Subjective:  seen in room today, is out of ICU now.    Intake/Output Summary (Last 24 hours) at 06/11/2021 1558 Last data filed at 06/11/2021 0500 Gross per 24 hour  Intake 1424.3 ml  Output 2 ml  Net 1422.3 ml     Vitals:  Vitals:   06/11/21 1432 06/11/21 1444 06/11/21 1500 06/11/21 1530  BP: 108/64 111/63 105/67 104/63  Pulse: 68 68 69 72  Resp: (!) 22 20 18 16   Temp: 98.1 F (36.7 C)     TempSrc: Temporal     SpO2: 99%     Weight: 118.8 kg     Height:         Physical Exam:     alert, nad obese WM, not in distress, nasal O2  no jvd  Chest cta bilat to bases  Cor reg no RG  Abd soft ntnd no ascites quite obese   Ext no LE edema or UE edema   Alert, NF, ox3   LUE AVF+bruit  OP HD: [facility] MWF  4h  450bfr  123kg  2/2 bath  LU AVF   Hep ?  - mircera 225 mcg every 2 weeks - last received 200 mcg on 12/14  - venofer 100 mg (ordered for 10 doses)  - no vdra   Assessment/Plan:  AHRF/ COVID pna - extubated now. +scattered infiltrates by CXR c/w pna. Improving clinically.  ESRD - Usual HD MWF. Had HD M-Wed-Sun last week. Getting HD today off schedule. Labs and vol are good. Will keep on TTS while here for now d/t staffing.  Encephalopathy - d/t acute illness, COVID, esrd. Improved.  BP/ volume - BP's soft, noted he is now on midodrine. CXR read as patchy infiltrates, no edema and no vol excess on exam. 4kg under dry wt, will likely need lowering on dc.  Anemia ckd -  Ferritin 5300 so defering iron. On aranesp 200 mcg weekly on wed. SP PRBC's on 1/7 and 1/8 MBD ckd - hyperphos. on [place] and HD Covid PNA-  per primary team. S/p remdesivir  Atrial Fibrillation w/ RVR - management per primary team. S/p amio    Sol Blazing, MD 06/11/2021 3:58 PM

## 2021-06-12 DIAGNOSIS — J440 Chronic obstructive pulmonary disease with acute lower respiratory infection: Secondary | ICD-10-CM

## 2021-06-12 DIAGNOSIS — L8990 Pressure ulcer of unspecified site, unspecified stage: Secondary | ICD-10-CM | POA: Insufficient documentation

## 2021-06-12 DIAGNOSIS — U071 COVID-19: Secondary | ICD-10-CM

## 2021-06-12 DIAGNOSIS — I5023 Acute on chronic systolic (congestive) heart failure: Secondary | ICD-10-CM | POA: Diagnosis not present

## 2021-06-12 DIAGNOSIS — I4891 Unspecified atrial fibrillation: Secondary | ICD-10-CM | POA: Diagnosis not present

## 2021-06-12 DIAGNOSIS — J9621 Acute and chronic respiratory failure with hypoxia: Secondary | ICD-10-CM

## 2021-06-12 LAB — GLUCOSE, CAPILLARY
Glucose-Capillary: 165 mg/dL — ABNORMAL HIGH (ref 70–99)
Glucose-Capillary: 177 mg/dL — ABNORMAL HIGH (ref 70–99)
Glucose-Capillary: 181 mg/dL — ABNORMAL HIGH (ref 70–99)
Glucose-Capillary: 206 mg/dL — ABNORMAL HIGH (ref 70–99)
Glucose-Capillary: 226 mg/dL — ABNORMAL HIGH (ref 70–99)
Glucose-Capillary: 94 mg/dL (ref 70–99)

## 2021-06-12 MED ORDER — ACETAMINOPHEN 650 MG RE SUPP: 650.0000 mg | Freq: Four times a day (QID) | RECTAL | Status: DC | PRN

## 2021-06-12 MED ORDER — ACETAMINOPHEN 325 MG PO TABS: 650.0000 mg | ORAL_TABLET | Freq: Four times a day (QID) | ORAL | Status: DC | PRN

## 2021-06-12 NOTE — Progress Notes (Signed)
PROGRESS NOTE    Richard Hall  GXQ:[id number] DOB: April 14, 1956 DOA: 05/31/2021 PCP: [name], MD    Brief Narrative:  Richard Hall was admitted to the hospital with the working diagnosis of MSSA bacteremia and septic shock.   66 yo male with the past medical history of anemia, atrial fibrillation, congested heart failure, ERSD on HD, chronic respiratory failure, T2DM, neuropathy, dyslipidemia, HTN, hypothyroid, CVA, and prostate cancer who presented with dyspnea on dialysis.  Patient reported severe dyspnea at the dialysis unit after completing 4 hours of renal replacement therapy. Patient developed significant respiratory distress and was transported to the hospital. He required non invasive mechanical ventilation with Bipap. Apparently patient was noted to have dyspnea for a few days prior. On rout patient developed atrial fibrillation with RVR. HR 130 to 140, Blood pressure 120/87, RR 33, oxygen saturation 99% on Bipap. Patient with increased work of breathing, heart S1 and S2 present and tachycardic, lungs with rales  bilaterally, abdomen soft and no lower extremity edema.   SARS COVID 19 positive  Chest radiograph with cardiomegaly and hilar vascular congestion more prominent on the right side.   EKG with 151 bpm, right axis deviation, normal qtc, atrial fibrillation rhythm, with poor R wave progression, no significant ST segment or T wave changes.   Patient was placed on supplemental 02, systemic steroids and antiviral therapy.   01/03 patient developed acute hypercapnic respiratory failure and required invasive mechanical ventilation.   Blood cultures positive for MSSA, consulted ID with recommendations to continue antibiotic therapy on HD for total of 6 weeks.  TEE negative for vegetations.   01/09 patient was liberated from mechanical ventilation   01/10 transferred back to [facility].   Assessment & Plan:   Principal Problem:   Atrial fibrillation with RVR (HCC) Active  Problems:   OSA on CPAP   Hypothyroidism (acquired)   Essential hypertension   Dyslipidemia   Diabetes mellitus with end-stage renal disease (HCC)   ESRD (end stage renal disease) on dialysis [facility])   Atrial fibrillation (HCC)   Personal history of transient ischemic attack (TIA), and cerebral infarction without residual deficits   Acute on chronic respiratory failure with hypoxia (HCC)   CHF exacerbation (HCC)   COVID-19 virus infection   Elevated troponin   Chronic obstructive pulmonary disease with acute lower respiratory infection (HCC)   MSSA bacteremia   Encephalopathy acute   Malnutrition of moderate degree   Respiratory failure ([place])   Acute hypoxemic respiratory failure due to SARS COVID 19 viral pneumonia.  Patient with prolonged hospitalization, now off mechanical ventilation, no dyspnea or chest pain.  His oxygenation is 98% on room air.  He has completed antiviral therapy. Continue to be very weak and deconditioned, he has NG tube in place and is getting tube feedings.   2. MSSA bacteremia complicated with septic shock (present on admission). No endocarditis per TEE, follow up cultures with no growth. Plan to complete  2 weeks of antibiotic therapy with Cefazolin. Getting antibiotic therapy during HD.  Discontinue midodrine and continue close follow up on blood pressure.   3. Metabolic encephalopathy/ moderate calorie protein malnutrition.  Patient is more awake and alert, able to follow commands and answer to simple questions. Plan to continue neuro checks per unit protocol  Continue with dysphagia 2 diet.  Follow with PT, OT and speech therapy.   4. Atrial fibrillation.  Continue rate control. And anticoagulation with apixaban.    5. Obesity class 1. Stage coccyx pressure  ulcer not known if present on admission  Calculated BMI is 34.6,  Continue local skin care   6.  ESRD. Continue renal replacement therapy per nephrology recommendations.  Anemia of chronic  kidney disease, continue with Epo  7. T2DM. Continue insulin therapy with sliding scale and 2 units q 4 hrs.   8. Hypothyroid. Continue with levothyroxine.   Status is: Inpatient  Remains inpatient appropriate because: pending transfer to SNF    DVT prophylaxis: Apixaban   Code Status:    full  Family Communication:   No family at the bedside      Nutrition Status: Nutrition Problem: Moderate Malnutrition Etiology: chronic illness (ESRD/HD, CHF) Signs/Symptoms: mild muscle depletion, mild fat depletion Interventions: Tube feeding     Skin Documentation: Pressure Injury 06/11/21 Coccyx Medial Stage 2 -  Partial thickness loss of dermis presenting as a shallow open injury with a red, pink wound bed without slough. (Active)  06/11/21 2159  Location: Coccyx  Location Orientation: Medial  Staging: Stage 2 -  Partial thickness loss of dermis presenting as a shallow open injury with a red, pink wound bed without slough.  Wound Description (Comments):   Present on Admission:      Consultants:  ID  Nephrology  Cardiology     Antimicrobials:  Cefazolin    Subjective: Patient is feeling better, but continue to be very weak and deconditioned, has NG tube in place.  Objective: Vitals:   06/12/21 0800 06/12/21 0857 06/12/21 1050 06/12/21 1051  BP: 112/68  107/63 107/63  Pulse: (!) 54  71 74  Resp: $Remo'16  17 16  'hbrsk$ Temp: 97.8 F (36.6 C)   98.4 F (36.9 C)  TempSrc: Oral   Oral  SpO2: 99% 100% 98% 98%  Weight:      Height:        Intake/Output Summary (Last 24 hours) at 06/12/2021 1428 Last data filed at 06/12/2021 1300 Gross per 24 hour  Intake 1666 ml  Output 2445 ml  Net -779 ml   Filed Weights   06/11/21 1432 06/11/21 1811 06/12/21 0400  Weight: 118.8 kg 116.5 kg 115.8 kg    Examination:   General: Not in pain or dyspnea, deconditioned  Neurology: Awake and alert, non focal  E ENT: positive pallor, no icterus, oral mucosa moist Cardiovascular: No JVD.  S1-S2 present, rhythmic, no gallops, rubs, or murmurs. No lower extremity edema. Pulmonary: positive breath sounds bilaterally, adequate air movement, no wheezing, rhonchi or rales. Gastrointestinal. Abdomen protuberant but soft and non tender Skin. No rashes Musculoskeletal: no joint deformities     Data Reviewed: I have personally reviewed following labs and imaging studies  CBC: Recent Labs  Lab 06/06/21 0748 06/06/21 1250 06/06/21 1706 06/07/21 0127 06/08/21 0105 06/09/21 0343 06/10/21 0451  WBC 10.0  --   --  9.4 13.5* 10.7* 11.7*  HGB 7.4*   < > 8.8* 7.0* 7.1* 7.1* 8.0*  HCT 23.7*   < > 26.0* 21.6* 23.3* 22.9* 25.4*  MCV 92.6  --   --  90.8 92.8 93.1 91.4  PLT 229  --   --  217 236 204 219   < > = values in this interval not displayed.   Basic Metabolic Panel: Recent Labs  Lab 06/05/21 1646 06/06/21 0606 06/06/21 1250 06/06/21 1651 06/06/21 1706 06/07/21 0127 06/08/21 0105 06/09/21 0343 06/10/21 0451  NA  --  130*   < >  --  132* 133* 132* 132* 137  K  --  4.3   < >  --  3.8 4.1 4.4 5.2* 4.1  CL  --  90*  --   --   --  95* 93* 95* 98  CO2  --  20*  --   --   --  23 22 20* 25  GLUCOSE  --  382*  --   --   --  302* 324* 135* 69*  BUN  --  125*  --   --   --  78* 108* 134* 86*  CREATININE  --  8.80*  --   --   --  5.60* 7.16* 8.88* 6.67*  CALCIUM  --  7.1*  --   --   --  7.9* 7.6* 7.4* 7.7*  MG 2.3 2.6*  --  2.4  --  2.2 2.5* 2.7* 2.6*  PHOS 6.9* 8.2*  --  5.9*  --  5.2* 4.8*  --   --    < > = values in this interval not displayed.   GFR: Estimated Creatinine Clearance: 14.3 mL/min (A) (by C-G formula based on SCr of 6.67 mg/dL (H)). Liver Function Tests: Recent Labs  Lab 06/06/21 0606 06/09/21 0343  AST 72* 30  ALT 183* 28  ALKPHOS 80 68  BILITOT 0.6 0.8  PROT 5.5* 5.9*  ALBUMIN 1.7* 1.8*   No results for input(s): LIPASE, AMYLASE in the last 168 hours. No results for input(s): AMMONIA in the last 168 hours. Coagulation Profile: No results for  input(s): INR, PROTIME in the last 168 hours. Cardiac Enzymes: No results for input(s): CKTOTAL, CKMB, CKMBINDEX, TROPONINI in the last 168 hours. BNP (last 3 results) No results for input(s): PROBNP in the last 8760 hours. HbA1C: No results for input(s): HGBA1C in the last 72 hours. CBG: Recent Labs  Lab 06/11/21 2030 06/12/21 0006 06/12/21 0430 06/12/21 0815 06/12/21 1048  GLUCAP 208* 206* 181* 165* 226*   Lipid Profile: No results for input(s): CHOL, HDL, LDLCALC, TRIG, CHOLHDL, LDLDIRECT in the last 72 hours. Thyroid Function Tests: No results for input(s): TSH, T4TOTAL, FREET4, T3FREE, THYROIDAB in the last 72 hours. Anemia Panel: No results for input(s): VITAMINB12, FOLATE, FERRITIN, TIBC, IRON, RETICCTPCT in the last 72 hours.    Radiology Studies: I have reviewed all of the imaging during this hospital visit personally     Scheduled Meds:  apixaban  5 mg Per Tube BID   chlorhexidine gluconate (MEDLINE KIT)  15 mL Mouth Rinse BID   Chlorhexidine Gluconate Cloth  6 each Topical Q0600   darbepoetin (ARANESP) injection - DIALYSIS  200 mcg Intravenous Q Wed-HD   docusate  100 mg Per Tube BID   feeding supplement (PROSource TF)  45 mL Per Tube BID   insulin aspart  0-20 Units Subcutaneous Q4H   insulin aspart  2 Units Subcutaneous Q4H   levothyroxine  100 mcg Per Tube QAC breakfast   mouth rinse  15 mL Mouth Rinse 10 times per day   midodrine  5 mg Per Tube TID WC   multivitamin  1 tablet Per Tube QHS   polyethylene glycol  17 g Per Tube Daily   sodium chloride flush  3 mL Intravenous Q12H   sodium zirconium cyclosilicate  10 g Oral Once   Continuous Infusions:  sodium chloride 10 mL/hr at 06/06/21 1700    ceFAZolin (ANCEF) IV 1 g (06/10/21 2043)   feeding supplement (NEPRO CARB STEADY) 50 mL/hr at 06/11/21 1000     LOS: 11 days        Jireh Elmore Annett Gula, MD

## 2021-06-12 NOTE — Progress Notes (Signed)
Occupational Therapy Treatment Patient Details Name: Richard Hall MRN: [id number] DOB: 01-11-1956 Today's Date: 06/12/2021   History of present illness Pt adm 12/30 with SOB. Found to have afib with rvr, acute on chronic respiratory failure with hypoxia, chf exacerbation, and Covid. On 1/3 pt with decr responsiveness and started on bipap and transferred to ICU. Increased AMS and lethargy on 06/06/21, intubated. CT found chronic non healed lt femur fx despite ORIF 2 years ago. Pt made NWB on LLE. PMH - ESRD on HD, CHf, afib, DM, neuropathy, HTN, CVA, metastatic prostate CA, lt femur ORIF.   OT comments  Pt requiring extensive time and 2 person assist for bed level mobility and to perform lateral scoot to chair. Total assist to don socks, set up for grooming once pt was in chair. Pt continues to demonstrate impaired cognition.    Recommendations for follow up therapy are one component of a multi-disciplinary discharge planning process, led by the attending physician.  Recommendations may be updated based on patient status, additional functional criteria and insurance authorization.    Follow Up Recommendations  Skilled nursing-short term rehab (<3 hours/day)    Assistance Recommended at Discharge Frequent or constant Supervision/Assistance  Patient can return home with the following  Two people to help with walking and/or transfers;Two people to help with bathing/dressing/bathroom   Equipment Recommendations  Other (comment) (defer to next venue)    Recommendations for Other Services      Precautions / Restrictions Precautions Precautions: Fall Restrictions Weight Bearing Restrictions: Yes LLE Weight Bearing: Non weight bearing       Mobility Bed Mobility Overal bed mobility: Needs Assistance Bed Mobility: Supine to Sit     Supine to sit: HOB elevated;Total assist;+2 for physical assistance;+2 for safety/equipment     General bed mobility comments: Cued pt to slide legs off R  EOB, but min initiation by pt, needing TAx2 to bring legs off bed and elevate trunk, cuing pt to pull up on therapist's hand to sit up.    Transfers Overall transfer level: Needs assistance Equipment used: Sliding board Transfers: Bed to chair/wheelchair/BSC;Sit to/from Stand Sit to Stand: Total assist;+2 physical assistance;+2 safety/equipment;From elevated surface          Lateral/Scoot Transfers: Total assist;+2 physical assistance;+2 safety/equipment;With slide board;From elevated surface General transfer comment: Attempted mini sit to stand from elevated EOB with L foot on top of therapist's to enure NWB, but pt placing wieght through it with attempt to stand with no success in clearing buttocks with TAx2, thus cued pt to stop and sit. Lateral scoot to R using bed pad on sliding board to maximize skin integrity with transfer, TAx2 to scoot on board, cuing pt to move R hand laterally to push and pull with min initiation by pt.     Balance Overall balance assessment: Needs assistance Sitting-balance support: Bilateral upper extremity supported;Feet supported Sitting balance-Leahy Scale: Poor Sitting balance - Comments: Pt initially with posterior lean, needing minA to prevent LOB, but quickly progressed to min guard once pt leaned anteriorlyu to bring nose over toes. Cues provided for lateral leaning at EOB, needing maxA to complete either direction but pt more resistive to leaning to R. Postural control: Posterior lean   Standing balance-Leahy Scale: Zero Standing balance comment: Unable                           ADL either performed or assessed with clinical judgement   ADL Overall ADL's :  Needs assistance/impaired Eating/Feeding: Set up;Sitting   Grooming: Brushing hair;Oral care;Sitting;Set up               Lower Body Dressing: Total assistance;Bed level Lower Body Dressing Details (indicate cue type and reason): reports he wore slippers prior to admission                     Extremity/Trunk Assessment              Vision       Perception     Praxis      Cognition Arousal/Alertness: Awake/alert Behavior During Therapy: Flat affect Overall Cognitive Status: Impaired/Different from baseline Area of Impairment: Attention;Memory;Following commands;Awareness;Problem solving;Safety/judgement                   Current Attention Level: Focused Memory: Decreased short-term memory Following Commands: Follows one step commands with increased time;Follows one step commands inconsistently Safety/Judgement: Decreased awareness of deficits;Decreased awareness of safety Awareness: Intellectual Problem Solving: Slow processing;Decreased initiation;Requires tactile cues;Requires verbal cues;Difficulty sequencing General Comments: Pt with flat affect and tends to stare majority of session. Has difficult time attending to cues and new tasks, needing excessive extra time and max multi-modal cues to initiate single step commands and sequencing.          Exercises    Shoulder Instructions       General Comments VSS on RA    Pertinent Vitals/ Pain       Pain Assessment: Faces Faces Pain Scale: Hurts little more Pain Location: legs with mobility Pain Descriptors / Indicators: Discomfort;Grimacing;Guarding Pain Intervention(s): Monitored during session;Repositioned  Home Living                                          Prior Functioning/Environment              Frequency  Min 2X/week        Progress Toward Goals  OT Goals(current goals can now be found in the care plan section)  Progress towards OT goals: Progressing toward goals  Acute Rehab OT Goals OT Goal Formulation: Patient unable to participate in goal setting Time For Goal Achievement: 06/21/21 Potential to Achieve Goals: [place] Discharge plan remains appropriate    Co-evaluation    PT/OT/SLP Co-Evaluation/Treatment:  Yes Reason for Co-Treatment: For patient/therapist safety PT goals addressed during session: Mobility/safety with mobility;Balance;Proper use of DME;Strengthening/ROM OT goals addressed during session: ADL's and self-care;Strengthening/ROM      AM-PAC OT "6 Clicks" Daily Activity     Outcome Measure   Help from another person eating meals?: None Help from another person taking care of personal grooming?: A Little Help from another person toileting, which includes using toliet, bedpan, or urinal?: Total Help from another person bathing (including washing, rinsing, drying)?: A Lot Help from another person to put on and taking off regular upper body clothing?: A Little Help from another person to put on and taking off regular lower body clothing?: Total 6 Click Score: 14    End of Session Equipment Utilized During Treatment: Gait belt  OT Visit Diagnosis: Muscle weakness (generalized) (M62.81)   Activity Tolerance Patient limited by fatigue   Patient Left in chair;with call bell/phone within reach;with chair alarm set   Nurse Communication Need for lift equipment;Mobility status        Time: 8546-2703 OT Time Calculation (min): 33  min  Charges: OT General Charges $OT Visit: 1 Visit OT Treatments $Self Care/Home Management : 8-22 mins  Nestor Lewandowsky, OTR/L Acute Rehabilitation Services Pager: [phone] Office: [phone]   Malka So 06/12/2021, 12:00 PM

## 2021-06-12 NOTE — Progress Notes (Signed)
Speech Language Pathology Treatment: Dysphagia  Patient Details Name: Richard Hall MRN: [id number] DOB: [birth year] Today's Date: 06/12/2021 Time: 8588-5027 SLP Time Calculation (min) (ACUTE ONLY): 9 min  Assessment / Plan / Recommendation Clinical Impression  Pt says he has been enjoying his Dys 2 diet since initiation on previous date and denies trouble swallowing. He's drowsy this afternoon and needs increased cues for sustained attention, but still consumed thin liquids and small bites of food without any overt s/s of aspiration today, exhibiting only prolonged mastication in the setting of not having his dentures. As a result, pt says he prefers to stay on Dys 2 diet. Given mentation and recent intubation will f/u briefly, and will see if he has potential to advance solids if his dentures become available.    HPI HPI: Pt is a 66 yo male admitted on 12/30 with SOB. Found to have afib with rvr, acute on chronic respiratory failure with hypoxia, chf exacerbation, and Covid. He was transferred to the ICU on 1/3 due to AMS. CTH 1/3 without acute changes. ETT 1/5-1/8. PMH includes: CVA, CHF, DMII, HTN, anemia, OSA, prostate ca, ESRD on HD      SLP Plan  Continue with current plan of care      Recommendations for follow up therapy are one component of a multi-disciplinary discharge planning process, led by the attending physician.  Recommendations may be updated based on patient status, additional functional criteria and insurance authorization.    Recommendations  Diet recommendations: Dysphagia 2 (fine chop);Thin liquid Liquids provided via: Cup;Straw Medication Administration: Whole meds with puree Supervision: Patient able to self feed;Intermittent supervision to cue for compensatory strategies Compensations: Slow rate;Small sips/bites Postural Changes and/or Swallow Maneuvers: Seated upright 90 degrees                Oral Care Recommendations: Oral care BID Follow Up  Recommendations: Skilled nursing-short term rehab (<3 hours/day) Assistance recommended at discharge: Frequent or constant Supervision/Assistance SLP Visit Diagnosis: Dysphagia, unspecified (R13.10) Plan: Continue with current plan of care           Osie Bond., M.A. [place] Acute Rehabilitation Services Pager [phone] Office [phone]  06/12/2021, 5:02 PM

## 2021-06-12 NOTE — Progress Notes (Signed)
Physical Therapy Treatment Patient Details Name: Richard Hall MRN: [id number] DOB: 06/28/1955 Today's Date: 06/12/2021   History of Present Illness Pt adm 12/30 with SOB. Found to have afib with rvr, acute on chronic respiratory failure with hypoxia, chf exacerbation, and Covid. On 1/3 pt with decr responsiveness and started on bipap and transferred to ICU. Increased AMS and lethargy on 06/06/21, intubated. CT found chronic non healed lt femur fx despite ORIF 2 years ago. Pt made NWB on LLE. PMH - ESRD on HD, CHf, afib, DM, neuropathy, HTN, CVA, metastatic prostate CA, lt femur ORIF.    PT Comments    Pt reports at baseline he is normally w/c bound, performing stand pivot transfers with mod I. Currently, he displays deficits in L knee extension PROM and overall strength that impact his ability to stand on his R leg (NWB on L) today, even with TA x2. Due to pt's cognitive deficits (limited in attention, sequencing, problem-solving, and command following) and lack of strength, he required TA x2 for bed mobility and lateral scooting using a sliding board today. Informed RN to use oyer lift to return pt back to bed with hoyer pad in place already. Performed passive stretch and AROM exercises to address pt's weakness and L knee extension limitations. Will continue to follow acutely. Current recommendations remain appropriate.   Recommendations for follow up therapy are one component of a multi-disciplinary discharge planning process, led by the attending physician.  Recommendations may be updated based on patient status, additional functional criteria and insurance authorization.  Follow Up Recommendations  Skilled nursing-short term rehab (<3 hours/day)     Assistance Recommended at Discharge Frequent or constant Supervision/Assistance  Patient can return home with the following Two people to help with walking and/or transfers;Two people to help with bathing/dressing/bathroom;Assistance with  cooking/housework;Direct supervision/assist for medications management;Direct supervision/assist for financial management;Assist for transportation;Help with stairs or ramp for entrance   Equipment Recommendations  Hospital bed;Other (comment) (mechanical lift)    Recommendations for Other Services       Precautions / Restrictions Precautions Precautions: Fall Restrictions Weight Bearing Restrictions: Yes LLE Weight Bearing: Non weight bearing     Mobility  Bed Mobility Overal bed mobility: Needs Assistance Bed Mobility: Supine to Sit     Supine to sit: HOB elevated;Total assist;+2 for physical assistance;+2 for safety/equipment     General bed mobility comments: Cued pt to slide legs off R EOB, but min initiation by pt, needing TAx2 to bring legs off bed and elevate trunk, cuing pt to pull up on therapist's hand to sit up.    Transfers Overall transfer level: Needs assistance Equipment used: Sliding board Transfers: Bed to chair/wheelchair/BSC;Sit to/from Stand Sit to Stand: Total assist;+2 physical assistance;+2 safety/equipment;From elevated surface          Lateral/Scoot Transfers: Total assist;+2 physical assistance;+2 safety/equipment;With slide board;From elevated surface General transfer comment: Attempted mini sit to stand from elevated EOB with L foot on top of therapist's to enure NWB, but pt placing wieght through it with attempt to stand with no success in clearing buttocks with TAx2, thus cued pt to stop and sit. Lateral scoot to R using bed pad on sliding board to maximize skin integrity with transfer, TAx2 to scoot on board, cuing pt to move R hand laterally to push and pull with min initiation by pt.    Ambulation/Gait               General Gait Details: Unable   Stairs  Wheelchair Mobility    Modified Rankin (Stroke Patients Only)       Balance Overall balance assessment: Needs assistance Sitting-balance support:  Bilateral upper extremity supported;Feet supported Sitting balance-Leahy Scale: Poor Sitting balance - Comments: Pt initially with posterior lean, needing minA to prevent LOB, but quickly progressed to min guard once pt leaned anteriorlyu to bring nose over toes. Cues provided for lateral leaning at EOB, needing maxA to complete either direction but pt more resistive to leaning to R. Postural control: Posterior lean     Standing balance comment: Unable                            Cognition Arousal/Alertness: Awake/alert Behavior During Therapy: Flat affect Overall Cognitive Status: Impaired/Different from baseline Area of Impairment: Attention;Memory;Following commands;Awareness;Problem solving;Safety/judgement                   Current Attention Level: Focused Memory: Decreased short-term memory Following Commands: Follows one step commands with increased time;Follows one step commands inconsistently Safety/Judgement: Decreased awareness of deficits;Decreased awareness of safety Awareness: Intellectual Problem Solving: Slow processing;Decreased initiation;Requires tactile cues;Requires verbal cues;Difficulty sequencing General Comments: Pt with flat affect and tends to stare majority of session. Has difficult time attending to cues and new tasks, needing excessive extra time and max multi-modal cues to initiate single step commands and sequencing.        Exercises General Exercises - Upper Extremity Shoulder Flexion: AROM;Strengthening;Both;5 reps;Seated General Exercises - Lower Extremity Long Arc Quad: AROM;Strengthening;Both;10 reps;Seated Other Exercises Other Exercises: PROM gently to L knee into extension    General Comments General comments (skin integrity, edema, etc.): VSS on RA      Pertinent Vitals/Pain Pain Assessment: Faces Faces Pain Scale: Hurts little more Pain Location: legs with mobility Pain Descriptors / Indicators:  Discomfort;Grimacing;Guarding Pain Intervention(s): Monitored during session;Limited activity within patient's tolerance;Repositioned;Other (comment) (stretching gently)    Home Living                          Prior Function            PT Goals (current goals can now be found in the care plan section) Acute Rehab PT Goals Patient Stated Goal: to go home eventually PT Goal Formulation: With patient Time For Goal Achievement: 06/19/21 Potential to Achieve Goals: Fair Progress towards PT goals: Progressing toward goals    Frequency    Min 2X/week      PT Plan Equipment recommendations need to be updated    Co-evaluation PT/OT/SLP Co-Evaluation/Treatment: Yes Reason for Co-Treatment: Necessary to address cognition/behavior during functional activity;For patient/therapist safety;To address functional/ADL transfers PT goals addressed during session: Mobility/safety with mobility;Balance;Proper use of DME;Strengthening/ROM        AM-PAC PT "6 Clicks" Mobility   Outcome Measure  Help needed turning from your back to your side while in a flat bed without using bedrails?: A Lot Help needed moving from lying on your back to sitting on the side of a flat bed without using bedrails?: Total Help needed moving to and from a bed to a chair (including a wheelchair)?: Total Help needed standing up from a chair using your arms (e.g., wheelchair or bedside chair)?: Total Help needed to walk in hospital room?: Total Help needed climbing 3-5 steps with a railing? : Total 6 Click Score: 7    End of Session   Activity Tolerance: Patient tolerated treatment well Patient left:  with call bell/phone within reach;in chair;with chair alarm set Nurse Communication: Mobility status;Need for lift equipment PT Visit Diagnosis: Other abnormalities of gait and mobility (R26.89);Muscle weakness (generalized) (M62.81);Difficulty in walking, not elsewhere classified (R26.2)     Time:  1281-1886 PT Time Calculation (min) (ACUTE ONLY): 33 min  Charges:  $Therapeutic Activity: 8-22 mins                     Moishe Spice, PT, DPT Acute Rehabilitation Services  Pager: [phone] Office: [place] 06/12/2021, 9:07 AM

## 2021-06-12 NOTE — Progress Notes (Signed)
Patient aspirated on his dinner. VSS and patient resting. MD notified. Will continue to monitor and follow aspiration precautions

## 2021-06-12 NOTE — NC FL2 (Signed)
[place] LEVEL OF CARE SCREENING TOOL     IDENTIFICATION  Patient Name: Richard Hall Birthdate: 02/28/1956 Sex: male Admission Date (Current Location): 05/31/2021  [facility] and [place] Number:  Herbalist and Address:  [facility], [place] [address] [place], [place] [address]      Provider Number: [id number]  Attending Physician Name and Address:  Tawni Millers,*  Relative Name and Phone Number:  Gerken,rodney Pandora Leiter)   [phone] [facility] Phone)    Current Level of Care: Hospital Recommended Level of Care: [place] Prior Approval Number:    Date Approved/Denied:   PASRR Number: [id number]  Discharge Plan: SNF    Current Diagnoses: Patient Active Problem List   Diagnosis Date Noted   Respiratory failure ([place])    Malnutrition of moderate degree 06/06/2021   MSSA bacteremia    Encephalopathy acute    Chronic obstructive pulmonary disease with acute lower respiratory infection ([place])    Elevated troponin    Atrial fibrillation with RVR ([place]) 05/31/2021   Acute on chronic respiratory failure with hypoxia ([place]) 05/31/2021   CHF exacerbation ([place]) 05/31/2021   COVID-19 virus infection 05/31/2021   Urinary retention 07/26/2020   Prostate cancer metastatic to bone ([place]) 05/18/2019   Fracture 05/17/2019   Hypocalcemia 05/14/2019   Bilateral leg edema 01/28/2019   Symptomatic anemia 01/05/2019   Gross hematuria 01/05/2019   Gastrointestinal hemorrhage with melena 01/05/2019   OSA on CPAP    Hypothyroidism (acquired)    Essential hypertension    Dyslipidemia    Diabetes mellitus with end-stage renal disease ([place])    Acute blood loss anemia    Chronic diastolic congestive heart failure ([place]) 02/09/2018   Anemia, chronic renal failure, stage 4 (severe) ([place]) 11/09/2017   Chronic respiratory failure with hypoxia and hypercapnia (HCC) 11/09/2017   Iron deficiency anemia 10/28/2017   Atrial  fibrillation ([place]) 10/07/2017   Morbid obesity with BMI of 40.0-44.9, adult ([place]) 07/14/2016   Diabetic peripheral neuropathy associated with type 2 diabetes mellitus ([place]) 10/10/2015   Allergy 04/30/2015   Bronchitis 04/30/2015   ED (erectile dysfunction) of organic origin 04/30/2015   Lipoma of back 04/30/2015   Microalbuminuria 04/30/2015   Osteoarthritis 04/30/2015   Microalbuminuria due to type 2 diabetes mellitus ([place]) 01/09/2014   ESRD (end stage renal disease) on dialysis ([place]) 07/22/2013   Acute ill-defined cerebrovascular disease 04/04/2013   Dizziness 04/04/2013   Hypercholesterolemia 04/04/2013   Hypoxemia 04/04/2013   Insomnia 04/04/2013   Periodic limb movement disorder 04/04/2013   Diplopia 06/07/2012   Personal history of transient ischemic attack (TIA), and cerebral infarction without residual deficits 05/19/2011    Orientation RESPIRATION BLADDER Height & Weight     Self, Time, Situation, Place  Normal External catheter Weight: 255 lb 4.7 oz (115.8 kg) Height:  6' (182.9 cm)  BEHAVIORAL SYMPTOMS/MOOD NEUROLOGICAL BOWEL NUTRITION STATUS      Continent Diet (See d/c summary)  AMBULATORY STATUS COMMUNICATION OF NEEDS Skin   Extensive Assist Verbally PU Stage and Appropriate Care (Pressure injury Coccyx medial stage 2)                       Personal Care Assistance Level of Assistance  Bathing, Feeding, Dressing Bathing Assistance: Maximum assistance Feeding assistance: Limited assistance Dressing Assistance: Maximum assistance     Functional Limitations Info  Sight, Hearing, Speech Sight Info: Adequate Hearing Info: Adequate Speech Info: Adequate    SPECIAL CARE  FACTORS FREQUENCY  PT (By licensed PT), OT (By licensed OT)     PT Frequency: 5x/week OT Frequency: 5x/week            Contractures Contractures Info: Not present    Additional Factors Info  Code Status, Allergies Code Status Info: Full code Allergies Info: Gabapentin            Current Medications (06/12/2021):  This is the current hospital active medication list Current Facility-Administered Medications  Medication Dose Route Frequency Provider Last Rate Last Admin   0.9 %  sodium chloride infusion  250 mL Intravenous Continuous Anders Simmonds, MD 10 mL/hr at 06/06/21 1700 Infusion Verify at 06/06/21 1700   acetaminophen (TYLENOL) tablet 650 mg  650 mg Oral Q6H PRN Marcelyn Bruins, MD   650 mg at 06/06/21 8676   Or   acetaminophen (TYLENOL) suppository 650 mg  650 mg Rectal Q6H PRN Marcelyn Bruins, MD   650 mg at 06/06/21 0021   apixaban (ELIQUIS) tablet 5 mg  5 mg Per Tube BID Einar Grad, RPH   5 mg at 06/12/21 0859   ceFAZolin (ANCEF) IVPB 1 g/50 mL premix  1 g Intravenous Q24H Laurice Record, MD 100 mL/hr at 06/10/21 2043 1 g at 06/10/21 2043   chlorhexidine gluconate (MEDLINE KIT) (PERIDEX) 0.12 % solution 15 mL  15 mL Mouth Rinse BID Margaretha Seeds, MD   15 mL at 06/12/21 0917   Chlorhexidine Gluconate Cloth 2 % PADS 6 each  6 each Topical Q0600 Claudia Desanctis, MD   6 each at 06/12/21 1950   Darbepoetin Alfa (ARANESP) injection 200 mcg  200 mcg Intravenous Q Wed-HD Patrecia Pour, MD       docusate (COLACE) 50 MG/5ML liquid 100 mg  100 mg Per Tube BID Gleason, Otilio Carpen, PA-C   100 mg at 06/11/21 2040   feeding supplement (NEPRO CARB STEADY) liquid 1,000 mL  1,000 mL Per Tube Continuous Julian Hy, DO 50 mL/hr at 06/11/21 1000 Rate Change at 06/11/21 1000   feeding supplement (PROSource TF) liquid 45 mL  45 mL Per Tube BID Noemi Chapel P, DO   45 mL at 06/12/21 0859   insulin aspart (novoLOG) injection 0-20 Units  0-20 Units Subcutaneous Q4H Gleason, Otilio Carpen, PA-C   7 Units at 06/12/21 1108   insulin aspart (novoLOG) injection 2 Units  2 Units Subcutaneous Q4H Noemi Chapel P, DO   2 Units at 06/12/21 1109   ipratropium-albuterol (DUONEB) 0.5-2.5 (3) MG/3ML nebulizer solution 3 mL  3 mL Nebulization Q6H PRN Kipp Brood, MD        levothyroxine (SYNTHROID) tablet 100 mcg  100 mcg Per Tube QAC breakfast Kipp Brood, MD   100 mcg at 06/12/21 0859   MEDLINE mouth rinse  15 mL Mouth Rinse 10 times per day Margaretha Seeds, MD   15 mL at 06/12/21 0900   metoprolol tartrate (LOPRESSOR) injection 2.5-5 mg  2.5-5 mg Intravenous Q6H PRN Frederik Pear, MD   5 mg at 06/05/21 0054   midodrine (PROAMATINE) tablet 5 mg  5 mg Per Tube TID WC Noemi Chapel P, DO   5 mg at 06/12/21 1104   multivitamin (RENA-VIT) tablet 1 tablet  1 tablet Per Tube QHS Kipp Brood, MD   1 tablet at 06/11/21 2040   polyethylene glycol (MIRALAX / GLYCOLAX) packet 17 g  17 g Per Tube Daily Gleason, Otilio Carpen, PA-C   17 g at 06/12/21  0859   sodium chloride flush (NS) 0.9 % injection 3 mL  3 mL Intravenous Q12H Marcelyn Bruins, MD   3 mL at 06/12/21 0900   sodium zirconium cyclosilicate (LOKELMA) packet 10 g  10 g Oral Once Claudia Desanctis, MD         Discharge Medications: Please see discharge summary for a list of discharge medications.  Relevant Imaging Results:  Relevant Lab Results:   Additional Information SSN: [id number]; 12 days out from positive covid.  Wall, LCSW

## 2021-06-12 NOTE — Progress Notes (Signed)
Mobility Specialist Progress Note:   06/12/21 1110  Mobility  Activity Transferred:  Chair to bed  Level of Assistance Total care  Assistive Device MaxiSlide  Mobility Response Tolerated fair  Mobility performed by Mobility specialist;Nurse tech  $Mobility charge 1 Mobility   Pt wanting to go back to bed.  [facility] Public librarian Phone [phone] Secondary Phone [phone]

## 2021-06-12 NOTE — TOC Progression Note (Signed)
Transition of Care [facility]) - Progression Note    Patient Details  Name: Richard Hall MRN: [id number] Date of Birth: Feb 09, 1956  Transition of Care [facility]) CM/SW Contact  Reece Agar, [place] Phone Number: 06/12/2021, 2:55 PM  Clinical Narrative:    CSW spoke with pt who requested a SNF in [facility]. Pt still has feeding tube, CSW will wait for offers to provide to pt's.   Expected Discharge Plan: Skilled Nursing Facility Barriers to Discharge: Continued Medical Work up  Expected Discharge Plan and Services Expected Discharge Plan: [place] In-house Referral: Clinical Social Work     Living arrangements for the past 2 months: Single Family Home                                       Social Determinants of Health (SDOH) Interventions    Readmission Risk Interventions Readmission Risk Prevention Plan 01/12/2019  Transportation Screening Complete  PCP or Specialist Appt within 3-5 Days Complete  HRI or [place] Complete  Social Work Consult for [place] Planning/Counseling Complete  Palliative Care Screening Not Applicable  Medication Review Press photographer) Complete  Some recent data might be hidden

## 2021-06-12 NOTE — Progress Notes (Signed)
[facility] Progress Note  Name: Richard Hall MRN: [id number] DOB: 1956/04/28  Subjective:  seen in room, in good spirits.  Cough present but improved. No SOB. Wants NG taken out.    Intake/Output Summary (Last 24 hours) at 06/12/2021 1130 Last data filed at 06/12/2021 0840 Gross per 24 hour  Intake 1486 ml  Output 2445 ml  Net -959 ml     Vitals:  Vitals:   06/12/21 0800 06/12/21 0857 06/12/21 1050 06/12/21 1051  BP: 112/68  107/63 107/63  Pulse: (!) 54  71 74  Resp: 16  17 16   Temp: 97.8 F (36.6 C)   98.4 F (36.9 C)  TempSrc: Oral   Oral  SpO2: 99% 100% 98% 98%  Weight:      Height:         Physical Exam:     alert, nad obese WM, not in distress, nasal O2  no jvd  Chest cta bilat to bases  Cor reg no RG  Abd soft ntnd no ascites quite obese   Ext 1+ L thigh edema, no other edema   Alert, NF, ox3   LUE AVF+bruit  OP HD: [facility] MWF  4h  450 bfr  123kg  2/2 bath  LU AVF   Hep ?  - mircera 225 mcg every 2 weeks - last received 200 mcg on 12/14  - venofer 100 mg (ordered for 10 doses)  - no vdra   Assessment/Plan:  AHRF/ COVID pna - was on vent 1/05 to 1/08.  Scattered infiltrates by CXR c/w pna. Improving clinically.  ESRD - Usual HD MWF. Had HD M-Wed-Sun last week. Had HD yesterday off schedule.  Labs and vol are good. Is 7 kg under dry wt. Will keep on TTS this week for now d/t staffing. HD tomorrow.  Encephalopathy - d/t acute illness, COVID, esrd. Improved.  BP/ volume - BP's soft, noted he is now on midodrine. Initial CXR read as patchy infiltrates, no edema and no vol excess on exam. BPs low normal and is now 7 kg under dry wt. Small UF w/ next HD.  Anemia ckd -  Ferritin 5300 so defering iron. On aranesp 200 mcg weekly on wed. SP PRBC's on 1/7 and 1/8 MBD ckd - hyperphos. on [place] and HD Covid PNA-  per primary team. S/p remdesivir  Atrial Fibrillation w/ RVR - management per primary team. S/p amio    Sol Blazing, MD 06/11/2021  3:58 PM

## 2021-06-13 DIAGNOSIS — E440 Moderate protein-calorie malnutrition: Secondary | ICD-10-CM

## 2021-06-13 DIAGNOSIS — I5023 Acute on chronic systolic (congestive) heart failure: Secondary | ICD-10-CM | POA: Diagnosis not present

## 2021-06-13 DIAGNOSIS — J9621 Acute and chronic respiratory failure with hypoxia: Secondary | ICD-10-CM | POA: Diagnosis not present

## 2021-06-13 DIAGNOSIS — I4891 Unspecified atrial fibrillation: Secondary | ICD-10-CM | POA: Diagnosis not present

## 2021-06-13 DIAGNOSIS — B9561 Methicillin susceptible Staphylococcus aureus infection as the cause of diseases classified elsewhere: Secondary | ICD-10-CM

## 2021-06-13 DIAGNOSIS — J440 Chronic obstructive pulmonary disease with acute lower respiratory infection: Secondary | ICD-10-CM | POA: Diagnosis not present

## 2021-06-13 DIAGNOSIS — R7881 Bacteremia: Secondary | ICD-10-CM

## 2021-06-13 LAB — BASIC METABOLIC PANEL
Anion gap: 13 (ref 5–15)
BUN: 87 mg/dL — ABNORMAL HIGH (ref 8–23)
CO2: 23 mmol/L (ref 22–32)
Calcium: 6.7 mg/dL — ABNORMAL LOW (ref 8.9–10.3)
Chloride: 90 mmol/L — ABNORMAL LOW (ref 98–111)
Creatinine, Ser: 6.72 mg/dL — ABNORMAL HIGH (ref 0.61–1.24)
GFR, Estimated: 8 mL/min — ABNORMAL LOW (ref >60)
Glucose, Bld: 177 mg/dL — ABNORMAL HIGH (ref 70–99)
Potassium: 4 mmol/L (ref 3.5–5.1)
Sodium: 126 mmol/L — ABNORMAL LOW (ref 135–145)

## 2021-06-13 LAB — CBC
HCT: 27.6 % — ABNORMAL LOW (ref 39.0–52.0)
Hemoglobin: 9 g/dL — ABNORMAL LOW (ref 13.0–17.0)
MCH: 29.3 pg (ref 26.0–34.0)
MCHC: 32.6 g/dL (ref 30.0–36.0)
MCV: 89.9 fL (ref 80.0–100.0)
Platelets: 221 10*3/uL (ref 150–400)
RBC: 3.07 MIL/uL — ABNORMAL LOW (ref 4.22–5.81)
RDW: 17.8 % — ABNORMAL HIGH (ref 11.5–15.5)
WBC: 9.9 10*3/uL (ref 4.0–10.5)
nRBC: 0.2 % (ref 0.0–0.2)

## 2021-06-13 LAB — GLUCOSE, CAPILLARY
Glucose-Capillary: 177 mg/dL — ABNORMAL HIGH (ref 70–99)
Glucose-Capillary: 180 mg/dL — ABNORMAL HIGH (ref 70–99)
Glucose-Capillary: 227 mg/dL — ABNORMAL HIGH (ref 70–99)
Glucose-Capillary: 175 mg/dL — ABNORMAL HIGH (ref 70–99)
Glucose-Capillary: 167 mg/dL — ABNORMAL HIGH (ref 70–99)
Glucose-Capillary: 235 mg/dL — ABNORMAL HIGH (ref 70–99)

## 2021-06-13 MED ORDER — POLYETHYLENE GLYCOL 3350 17 G PO PACK
17.0000 g | PACK | Freq: Every day | ORAL | Status: DC
  Filled 2021-06-13: qty 1

## 2021-06-13 MED ORDER — APIXABAN 5 MG PO TABS
5.0000 mg | ORAL_TABLET | Freq: Two times a day (BID) | ORAL | Status: DC
  Administered 2021-06-13 – 2021-06-14 (×2): 5 mg via ORAL
  Filled 2021-06-13 (×2): qty 1

## 2021-06-13 MED ORDER — ACETAMINOPHEN 325 MG PO TABS: 650.0000 mg | ORAL_TABLET | Freq: Four times a day (QID) | ORAL | Status: DC | PRN

## 2021-06-13 MED ORDER — RENA-VITE PO TABS
1.0000 | ORAL_TABLET | Freq: Every day | ORAL | Status: DC
  Administered 2021-06-13: 1 via ORAL
  Filled 2021-06-13: qty 1

## 2021-06-13 MED ORDER — DARBEPOETIN ALFA 200 MCG/0.4ML IJ SOSY
200.0000 ug | PREFILLED_SYRINGE | INTRAMUSCULAR | Status: DC
  Administered 2021-06-13: 200 ug via SUBCUTANEOUS
  Filled 2021-06-13 (×2): qty 0.4

## 2021-06-13 MED ORDER — HEPARIN SODIUM (PORCINE) 5000 UNIT/ML IJ SOLN: 5000.0000 [IU] | Freq: Three times a day (TID) | INTRAMUSCULAR | Status: DC

## 2021-06-13 MED ORDER — LEVOTHYROXINE SODIUM 100 MCG PO TABS
100.0000 ug | ORAL_TABLET | Freq: Every day | ORAL | Status: DC
  Administered 2021-06-14: 100 ug via ORAL
  Filled 2021-06-13: qty 1

## 2021-06-13 NOTE — Progress Notes (Signed)
Cortrak removed per order. Patient tolerated well

## 2021-06-13 NOTE — Progress Notes (Signed)
Noted pt for snf placement. Pt receives out-pt HD in [place] on MWF. If pt is placed outside of [place] area, pt will need to receive out-pt HD at another clinic. Will communicate with CSW.   Melven Sartorius Renal Navigator [phone]

## 2021-06-13 NOTE — Progress Notes (Signed)
Patient complaining of not sleeping. "I haven't slept at all in days." RN notified MD Edwena Blow, RN

## 2021-06-13 NOTE — TOC Progression Note (Addendum)
Transition of Care [facility]) - Progression Note    Patient Details  Name: Richard Hall MRN: [id number] Date of Birth: Aug 19, 1955  Transition [facility] Care [facility]) CM/SW [place], [place] Phone Number: 06/13/2021, 4:33 PM  Clinical Narrative:    CSW spoke with Broadus John at [facility] in [place] who is willing to offer pt bed for tomorrow but had a few questions about meds that pt would DC with. CSW will send over DC summary in the morning so that Broadus John can review meds. CSW will start auth for possible DC tomorrow.  Broadus John (liaison) [phone]  Pt usual HD center is in [place] so HD center will not need to be changed but he is on a MWF schedule.   Auth started ref# [id number]   Expected Discharge Plan: [place] Barriers to Discharge: Continued Medical Work up  Expected Discharge Plan and Services Expected Discharge Plan: [place] In-house Referral: Clinical Social Work     Living arrangements for the past 2 months: Single Family Home                                       Social Determinants of Health (SDOH) Interventions    Readmission Risk Interventions Readmission Risk Prevention Plan 01/12/2019  Transportation Screening Complete  PCP or Specialist Appt within 3-5 Days Complete  HRI or [place] Complete  Social Work Consult for [place] Planning/Counseling Complete  Palliative Care Screening Not Applicable  Medication Review Press photographer) Complete  Some recent data might be hidden

## 2021-06-13 NOTE — Progress Notes (Signed)
PROGRESS NOTE    Richard Hall  JOA:[id number] DOB: 07/24/1955 DOA: 05/31/2021 PCP: [name], MD    Brief Narrative:  Richard Hall was admitted to the hospital with the working diagnosis of MSSA bacteremia and septic shock.    66 yo male with the past medical history of anemia, atrial fibrillation, congested heart failure, ERSD on HD, chronic respiratory failure, T2DM, neuropathy, dyslipidemia, HTN, hypothyroid, CVA, and prostate cancer who presented with dyspnea on dialysis.  Patient reported severe dyspnea at the dialysis unit after completing 4 hours of renal replacement therapy. Patient developed significant respiratory distress and was transported to the hospital. He required non invasive mechanical ventilation with Bipap. Apparently patient was noted to have dyspnea for a few days prior. On rout patient developed atrial fibrillation with RVR. HR 130 to 140, Blood pressure 120/87, RR 33, oxygen saturation 99% on Bipap. Patient with increased work of breathing, heart S1 and S2 present and tachycardic, lungs with rales  bilaterally, abdomen soft and no lower extremity edema.    SARS COVID 19 positive   Chest radiograph with cardiomegaly and hilar vascular congestion more prominent on the right side.    EKG with 151 bpm, right axis deviation, normal qtc, atrial fibrillation rhythm, with poor R wave progression, no significant ST segment or T wave changes.    Patient was placed on supplemental 02, systemic steroids and antiviral therapy.    01/03 patient developed acute hypercapnic respiratory failure and required invasive mechanical ventilation.    Blood cultures positive for MSSA, consulted ID with recommendations to continue antibiotic therapy on HD for total of 6 weeks.  TEE negative for vegetations.    01/09 patient was liberated from mechanical ventilation    01/10 transferred back to [facility].   Patient clinically improving, 01/12 discontinue Ng tube, pending transfer to SNF.     Assessment & Plan:   Principal Problem:   Atrial fibrillation with RVR (HCC) Active Problems:   OSA on CPAP   Hypothyroidism (acquired)   Essential hypertension   Dyslipidemia   Diabetes mellitus with end-stage renal disease (HCC)   ESRD (end stage renal disease) on dialysis [facility])   Atrial fibrillation (HCC)   Personal history of transient ischemic attack (TIA), and cerebral infarction without residual deficits   Acute on chronic respiratory failure with hypoxia (HCC)   CHF exacerbation (HCC)   COVID-19 virus infection   Elevated troponin   Chronic obstructive pulmonary disease with acute lower respiratory infection (HCC)   MSSA bacteremia   Encephalopathy acute   Malnutrition of moderate degree   Respiratory failure (HCC)   Pressure injury of skin     Acute hypoxemic respiratory failure due to SARS COVID 19 viral pneumonia.  Patient is off isolation, dyspnea has improved and his oxygen saturation is more than 92% on room air. He continue to be very weak and deconditioned.    Plan to discontinue NG tube, continue PT and OT.  Aspiration precautions and out of bed to chair tid with meals.  Continue with dysphagia 2 diet.    2. MSSA bacteremia complicated with septic shock (present on admission). No endocarditis per TEE, follow up cultures with no growth.   Continue antibiotic therapy with cefazolin with HD. To complete 06/20/21.  Patient has been responding well to antibiotic therapy.    3. Metabolic encephalopathy/ moderate calorie protein malnutrition.   Patient is more awake and alert. Plan to continue PT and OT Needs continue nursing care at SNF.  Continue nutritional  supplementation.  Encephalopathy has resolved   4. Atrial fibrillation.  Continue anticoagulation with apixaban.    5. Obesity class 1. Stage coccyx pressure ulcer not known if present on admission  BMI is 34.6,  Continue local skin care    6.  ESRD/ hyponatremia. Continue HD per nephrology  recommendations, T, Th, Sat Anemia of chronic kidney disease, continue with Epo Na is down tro 126 and K is 4,0 with serum bicarbonate at 23.   7. T2DM.  Glucose has been 94 and 227, will continue with insulin sliding scale for glucose cover and monitoring. Will discontinue tube feedings, and will hold on scheduled insulin for now to prevent hypoglycemia.    8. Hypothyroid. On levothyroxine.    Status is: Inpatient  Remains inpatient appropriate because: continue neuro checks and IV antibiotic therapy   DVT prophylaxis: Apixaban  Code Status:    full  Family Communication:   No family at the bedside     Nutrition Status: Nutrition Problem: Moderate Malnutrition Etiology: chronic illness (ESRD/HD, CHF) Signs/Symptoms: mild muscle depletion, mild fat depletion Interventions: Tube feeding    Skin Documentation: Pressure Injury 06/11/21 Coccyx Medial Stage 2 -  Partial thickness loss of dermis presenting as a shallow open injury with a red, pink wound bed without slough. (Active)  06/11/21 2159  Location: Coccyx  Location Orientation: Medial  Staging: Stage 2 -  Partial thickness loss of dermis presenting as a shallow open injury with a red, pink wound bed without slough.  Wound Description (Comments):   Present on Admission:      Consultants:  Nephrology    Antimicrobials:  Cefazolin     Subjective: Patient wit no nausea or vomiting, no chest pain or dyspnea, continue to be very weak and deconditioned.   Objective: Vitals:   06/12/21 1558 06/12/21 2000 06/13/21 0406 06/13/21 1052  BP: 113/68 (!) 103/54 (!) 104/57 115/64  Pulse: 71 73 75 71  Resp: _0 Temp: 97.6 F (36.4 C) 97.8 F (36.6 C) 98 F (36.7 C) 97.7 F (36.5 C)  TempSrc: Oral Oral Oral Oral  SpO2: 98% 96% 99% 98%  Weight:   118.2 kg   Height:        Intake/Output Summary (Last 24 hours) at 06/13/2021 1127 Last data filed at 06/13/2021 0855 Gross per 24 hour  Intake 280 ml  Output --   Net 280 ml   Filed Weights   06/11/21 1811 06/12/21 0400 06/13/21 0406  Weight: 116.5 kg 115.8 kg 118.2 kg    Examination:   General: Not in pain or dyspnea, deconditioned  Neurology: Awake and alert, non focal  E ENT: mild pallor, no icterus, oral mucosa moist Cardiovascular: No JVD. S1-S2 present, rhythmic, no gallops, rubs, or murmurs. No lower extremity edema. Pulmonary: positive breath sounds bilaterally, with no wheezing, rhonchi or rales. Gastrointestinal. Abdomen protuberant, but not tender or distended Skin. No rashes Musculoskeletal: no joint deformities     Data Reviewed: I have personally reviewed following labs and imaging studies  CBC: Recent Labs  Lab 06/06/21 1706 06/07/21 0127 06/08/21 0105 06/09/21 0343 06/10/21 0451  WBC  --  9.4 13.5* 10.7* 11.7*  HGB 8.8* 7.0* 7.1* 7.1* 8.0*  HCT 26.0* 21.6* 23.3* 22.9* 25.4*  MCV  --  90.8 92.8 93.1 91.4  PLT  --  217 236 204 130   Basic Metabolic Panel: Recent Labs  Lab 06/06/21 1651 06/06/21 1706 06/07/21 0127 06/08/21 0105 06/09/21 0343 06/10/21 0451 06/13/21 0324  NA  --    < > 133* 132* 132* 137 126*  K  --    < > 4.1 4.4 5.2* 4.1 4.0  CL  --   --  95* 93* 95* 98 90*  CO2  --   --  23 22 20* 25 23  GLUCOSE  --   --  302* 324* 135* 69* 177*  BUN  --   --  78* 108* 134* 86* 87*  CREATININE  --   --  5.60* 7.16* 8.88* 6.67* 6.72*  CALCIUM  --   --  7.9* 7.6* 7.4* 7.7* 6.7*  MG 2.4  --  2.2 2.5* 2.7* 2.6*  --   PHOS 5.9*  --  5.2* 4.8*  --   --   --    < > = values in this interval not displayed.   GFR: Estimated Creatinine Clearance: 14.3 mL/min (A) (by C-G formula based on SCr of 6.72 mg/dL (H)). Liver Function Tests: Recent Labs  Lab 06/09/21 0343  AST 30  ALT 28  ALKPHOS 68  BILITOT 0.8  PROT 5.9*  ALBUMIN 1.8*   No results for input(s): LIPASE, AMYLASE in the last 168 hours. No results for input(s): AMMONIA in the last 168 hours. Coagulation Profile: No results for input(s): INR,  PROTIME in the last 168 hours. Cardiac Enzymes: No results for input(s): CKTOTAL, CKMB, CKMBINDEX, TROPONINI in the last 168 hours. BNP (last 3 results) No results for input(s): PROBNP in the last 8760 hours. HbA1C: No results for input(s): HGBA1C in the last 72 hours. CBG: Recent Labs  Lab 06/12/21 1556 06/12/21 1938 06/13/21 0002 06/13/21 0408 06/13/21 0835  GLUCAP 94 177* 177* 180* 227*   Lipid Profile: No results for input(s): CHOL, HDL, LDLCALC, TRIG, CHOLHDL, LDLDIRECT in the last 72 hours. Thyroid Function Tests: No results for input(s): TSH, T4TOTAL, FREET4, T3FREE, THYROIDAB in the last 72 hours. Anemia Panel: No results for input(s): VITAMINB12, FOLATE, FERRITIN, TIBC, IRON, RETICCTPCT in the last 72 hours.    Radiology Studies: I have reviewed all of the imaging during this hospital visit personally     Scheduled Meds:  apixaban  5 mg Per Tube BID   chlorhexidine gluconate (MEDLINE KIT)  15 mL Mouth Rinse BID   Chlorhexidine Gluconate Cloth  6 each Topical Q0600   darbepoetin (ARANESP) injection - DIALYSIS  200 mcg Intravenous Q Wed-HD   docusate  100 mg Per Tube BID   feeding supplement (PROSource TF)  45 mL Per Tube BID   insulin aspart  0-20 Units Subcutaneous Q4H   insulin aspart  2 Units Subcutaneous Q4H   levothyroxine  100 mcg Per Tube QAC breakfast   multivitamin  1 tablet Per Tube QHS   polyethylene glycol  17 g Per Tube Daily   sodium chloride flush  3 mL Intravenous Q12H   sodium zirconium cyclosilicate  10 g Oral Once   Continuous Infusions:  sodium chloride 10 mL/hr at 06/06/21 1700    ceFAZolin (ANCEF) IV 1 g (06/12/21 1623)   feeding supplement (NEPRO CARB STEADY) Stopped (06/13/21 1040)     LOS: 12 days        Monnie Anspach [name] Apley, MD

## 2021-06-13 NOTE — Progress Notes (Signed)
[facility] Progress Note   Subjective: Seen in room. Afib on monitor. Denies  SOB. HD later today.     Objective Vitals:   06/12/21 1558 06/12/21 2000 06/13/21 0406 06/13/21 1052  BP: 113/68 (!) 103/54 (!) 104/57 115/64  Pulse: 71 73 75 71  Resp: $Remo'17 20 14 20  'PKFJb$ Temp: 97.6 F (36.4 C) 97.8 F (36.6 C) 98 F (36.7 C) 97.7 F (36.5 C)  TempSrc: Oral Oral Oral Oral  SpO2: 98% 96% 99% 98%  Weight:   118.2 kg   Height:       Physical Exam General: Chronically ill male in NAD Heart: S1,S2 irreg, irreg. No M/R/G. Afib on monitor. Rate is controlled.  Lungs: Slightly decreased in bases otherwise CTAB Abdomen: Obese, NABS, NT Extremities: No LE edema Dialysis Access: L AVF very bruised. +T/B   Additional Objective Labs: Basic Metabolic Panel: Recent Labs  Lab 06/06/21 1651 06/06/21 1706 06/07/21 0127 06/08/21 0105 06/09/21 0343 06/10/21 0451 06/13/21 0324  NA  --    < > 133* 132* 132* 137 126*  K  --    < > 4.1 4.4 5.2* 4.1 4.0  CL  --    < > 95* 93* 95* 98 90*  CO2  --    < > 23 22 20* 25 23  GLUCOSE  --    < > 302* 324* 135* 69* 177*  BUN  --    < > 78* 108* 134* 86* 87*  CREATININE  --    < > 5.60* 7.16* 8.88* 6.67* 6.72*  CALCIUM  --    < > 7.9* 7.6* 7.4* 7.7* 6.7*  PHOS 5.9*  --  5.2* 4.8*  --   --   --    < > = values in this interval not displayed.   Liver Function Tests: Recent Labs  Lab 06/09/21 0343  AST 30  ALT 28  ALKPHOS 68  BILITOT 0.8  PROT 5.9*  ALBUMIN 1.8*   No results for input(s): LIPASE, AMYLASE in the last 168 hours. CBC: Recent Labs  Lab 06/07/21 0127 06/08/21 0105 06/09/21 0343 06/10/21 0451  WBC 9.4 13.5* 10.7* 11.7*  HGB 7.0* 7.1* 7.1* 8.0*  HCT 21.6* 23.3* 22.9* 25.4*  MCV 90.8 92.8 93.1 91.4  PLT 217 236 204 219   Blood Culture    Component Value Date/Time   SDES BLOOD RIGHT HAND 06/06/2021 1045   SPECREQUEST  06/06/2021 1045    BOTTLES DRAWN AEROBIC AND ANAEROBIC Blood Culture results may not be  optimal due to an excessive volume of blood received in culture bottles   CULT  06/06/2021 1045    NO GROWTH 5 DAYS Performed at [facility] [address], [place], [place] [address]    REPTSTATUS 06/11/2021 FINAL 06/06/2021 1045    Cardiac Enzymes: No results for input(s): CKTOTAL, CKMB, CKMBINDEX, TROPONINI in the last 168 hours. CBG: Recent Labs  Lab 06/12/21 1938 06/13/21 0002 06/13/21 0408 06/13/21 0835 06/13/21 1205  GLUCAP 177* 177* 180* 227* 175*   Iron Studies: No results for input(s): IRON, TIBC, TRANSFERRIN, FERRITIN in the last 72 hours. $RemoveB'@lablastinr3'zXyeGbAc$ @ Studies/Results: No results found. Medications:  sodium chloride 10 mL/hr at 06/06/21 1700    ceFAZolin (ANCEF) IV 1 g (06/12/21 1623)    apixaban  5 mg Oral BID   chlorhexidine gluconate (MEDLINE KIT)  15 mL Mouth Rinse BID   Chlorhexidine Gluconate Cloth  6 each Topical Q0600   darbepoetin (ARANESP) injection - NON-DIALYSIS  200 mcg Subcutaneous Q Thu-1800   insulin aspart  0-20 Units Subcutaneous Q4H   [START ON 06/14/2021] levothyroxine  100 mcg Oral QAC breakfast   multivitamin  1 tablet Oral QHS   [START ON 06/14/2021] polyethylene glycol  17 g Oral Daily   sodium chloride flush  3 mL Intravenous Q12H     AOP HD: [facility] MWF  4h  450 bfr  123kg  2/2 bath  LU AVF   Hep ?  - mircera 225 mcg every 2 weeks - last received 200 mcg on 12/14  - venofer 100 mg (ordered for 10 doses)  - no vdra    Assessment/Plan:  AHRF/ COVID pna - was on vent 1/05 to 1/08.  Scattered infiltrates by CXR c/w pna. Improving clinically.  ESRD - Usual HD MWF. Had HD M-Wed-Sun last week. Had HD yesterday off schedule.  Labs and vol are good. Is 7 kg under dry wt. Will keep on TTS this week for now d/t staffing. HD 06/13/2021 off schedule.  Encephalopathy - d/t acute illness, COVID, esrd. Improved.  BP/ volume - BP's soft, noted he is now on midodrine. Initial CXR read as patchy infiltrates, no edema and no vol excess on exam.  BPs low normal and is now 7 kg under dry wt. Small UF w/ next HD. Lower EDW on discharge. Anemia ckd -  Ferritin 5300 so defering iron. On aranesp 200 mcg weekly on wed. SP PRBC's on 1/7 and 1/8 MBD ckd - hyperphos. on [place] and HD. No recent PO4. Add to labs today.  Covid PNA-  per primary team. S/p remdesivir  Atrial Fibrillation w/ RVR - management per primary team. S/p amio  Michalina Calbert H. Akbar Sacra NP-C 06/13/2021, 3:15 PM  [employer] [phone]

## 2021-06-14 LAB — GLUCOSE, CAPILLARY
Glucose-Capillary: 150 mg/dL — ABNORMAL HIGH (ref 70–99)
Glucose-Capillary: 167 mg/dL — ABNORMAL HIGH (ref 70–99)
Glucose-Capillary: 337 mg/dL — ABNORMAL HIGH (ref 70–99)
Glucose-Capillary: 60 mg/dL — ABNORMAL LOW (ref 70–99)
Glucose-Capillary: 87 mg/dL (ref 70–99)

## 2021-06-14 LAB — BASIC METABOLIC PANEL
Anion gap: 9 (ref 5–15)
BUN: 72 mg/dL — ABNORMAL HIGH (ref 8–23)
CO2: 26 mmol/L (ref 22–32)
Calcium: 6.8 mg/dL — ABNORMAL LOW (ref 8.9–10.3)
Chloride: 93 mmol/L — ABNORMAL LOW (ref 98–111)
Creatinine, Ser: 5.87 mg/dL — ABNORMAL HIGH (ref 0.61–1.24)
GFR, Estimated: 10 mL/min — ABNORMAL LOW (ref >60)
Glucose, Bld: 185 mg/dL — ABNORMAL HIGH (ref 70–99)
Potassium: 3.7 mmol/L (ref 3.5–5.1)
Sodium: 128 mmol/L — ABNORMAL LOW (ref 135–145)

## 2021-06-14 MED ORDER — CEFAZOLIN SODIUM-DEXTROSE 2-4 GM/100ML-% IV SOLN: 2.0000 g | INTRAVENOUS | 0 refills | Status: DC

## 2021-06-14 MED ORDER — LOPERAMIDE HCL 2 MG PO CAPS: 2.0000 mg | ORAL_CAPSULE | Freq: Four times a day (QID) | ORAL | 0 refills | Status: DC | PRN

## 2021-06-14 MED ORDER — INSULIN GLARGINE-YFGN 100 UNIT/ML ~~LOC~~ SOLN: 10.0000 [IU] | Freq: Every day | SUBCUTANEOUS | Status: DC

## 2021-06-14 MED ORDER — INSULIN GLARGINE-YFGN 100 UNIT/ML ~~LOC~~ SOLN: 10.0000 [IU] | Freq: Every day | SUBCUTANEOUS | 0 refills | Status: DC

## 2021-06-14 MED ORDER — CEFAZOLIN SODIUM-DEXTROSE 2-4 GM/100ML-% IV SOLN
2.0000 g | INTRAVENOUS | Status: DC
  Administered 2021-06-14: 2 g via INTRAVENOUS
  Filled 2021-06-14: qty 100

## 2021-06-14 MED ORDER — LOPERAMIDE HCL 2 MG PO CAPS: 2.0000 mg | ORAL_CAPSULE | Freq: Four times a day (QID) | ORAL | Status: DC | PRN

## 2021-06-14 MED ORDER — CEFAZOLIN IV (FOR PTA / DISCHARGE USE ONLY): 2.0000 g | INTRAVENOUS | 0 refills | Status: DC

## 2021-06-14 MED ORDER — RENA-VITE PO TABS: 1.0000 | ORAL_TABLET | Freq: Every day | ORAL | 0 refills | Status: DC

## 2021-06-14 NOTE — Progress Notes (Signed)
Pt is to d/c to [facility] today. Contacted [facility] and spoke to [employer], Therapist, sports. Clinic advised pt will d/c today to snf and will resume care on Monday (MWF 11:00 arrival/11:15 chair). Clinic also advised pt has received HD this week off schedule but per nephrologist pt has been treated 3x this week and should be fine to resume on Monday as out-pt. Case discussed with renal PA, Shanon Brow, who will provide orders to clinic for iv abx needs with HD at d/c. Pt's out-pt HD days/times provided to CSW to provide to snf.    Melven Sartorius Renal Navigator [phone]

## 2021-06-14 NOTE — Progress Notes (Addendum)
Inpatient Diabetes Program Recommendations  AACE/ADA: New Consensus Statement on Inpatient Glycemic Control (2015)  Target Ranges:  Prepandial:   less than 140 mg/dL      Peak postprandial:   less than 180 mg/dL (1-2 hours)      Critically ill patients:  140 - 180 mg/dL   Lab Results  Component Value Date   GLUCAP 87 06/14/2021   HGBA1C 6.1 (H) 06/01/2021    Review of Glycemic Control  Latest Reference Range & Units 06/14/21 00:02 06/14/21 04:00 06/14/21 07:39  Glucose-Capillary 70 - 99 mg/dL 337 (H) 167 (H) 60 (L)  (H): Data is abnormally high (L): Data is abnormally low Diabetes history: Type 2 DM Outpatient Diabetes medications: Toujeo 30 units QHS Current orders for Inpatient glycemic control: Novolog 0-20 units Q4H  Inpatient Diabetes Program Recommendations:    Noted hypoglycemia. Consider changing correction to Novolog 0-6 units TID & HS and adding Semglee 10 units QHS.   Thanks, Bronson Curb, MSN, RNC-OB Diabetes Coordinator [phone] (8a-5p)

## 2021-06-14 NOTE — Progress Notes (Signed)
Richard Hall Progress Note   Subjective: Seen in room, has SNF possibly today   Objective Vitals:   06/13/21 2040 06/13/21 2134 06/14/21 0400 06/14/21 1127  BP: 111/62 (!) 119/58 111/62 104/61  Pulse: 81  69 71  Resp:  $Remo'19 20 20  'rUbLu$ Temp:  97.7 F (36.5 C) 97.9 F (36.6 C) 98 F (36.7 C)  TempSrc:  Oral Oral Oral  SpO2: 100% 100% 95% 99%  Weight:   117.6 kg   Height:       Physical Exam General: Chronically ill male in NAD Heart: S1,S2 irreg, irreg. No M/R/G. Afib on monitor. Rate is controlled.  Lungs: Slightly decreased in bases otherwise CTAB Abdomen: Obese, NABS, NT Extremities: No LE edema Dialysis Access: L AVF very bruised. +T/B   Additional Objective Labs: Basic Metabolic Panel: Recent Labs  Lab 06/08/21 0105 06/09/21 0343 06/10/21 0451 06/13/21 0324 06/14/21 0206  NA 132*   < > 137 126* 128*  K 4.4   < > 4.1 4.0 3.7  CL 93*   < > 98 90* 93*  CO2 22   < > $R'25 23 26  'fx$ GLUCOSE 324*   < > 69* 177* 185*  BUN 108*   < > 86* 87* 72*  CREATININE 7.16*   < > 6.67* 6.72* 5.87*  CALCIUM 7.6*   < > 7.7* 6.7* 6.8*  PHOS 4.8*  --   --   --   --    < > = values in this interval not displayed.    Liver Function Tests: Recent Labs  Lab 06/09/21 0343  AST 30  ALT 28  ALKPHOS 68  BILITOT 0.8  PROT 5.9*  ALBUMIN 1.8*    No results for input(s): LIPASE, AMYLASE in the last 168 hours. CBC: Recent Labs  Lab 06/08/21 0105 06/09/21 0343 06/10/21 0451 06/13/21 2148  WBC 13.5* 10.7* 11.7* 9.9  HGB 7.1* 7.1* 8.0* 9.0*  HCT 23.3* 22.9* 25.4* 27.6*  MCV 92.8 93.1 91.4 89.9  PLT 236 204 219 221    Blood Culture    Component Value Date/Time   SDES BLOOD RIGHT HAND 06/06/2021 1045   SPECREQUEST  06/06/2021 1045    BOTTLES DRAWN AEROBIC AND ANAEROBIC Blood Culture results may not be optimal due to an excessive volume of blood received in culture bottles   CULT  06/06/2021 1045    NO GROWTH 5 DAYS Performed at [facility] [address], [place], [place] [address]    REPTSTATUS 06/11/2021 FINAL 06/06/2021 1045    Cardiac Enzymes: No results for input(s): CKTOTAL, CKMB, CKMBINDEX, TROPONINI in the last 168 hours. CBG: Recent Labs  Lab 06/14/21 0002 06/14/21 0400 06/14/21 0739 06/14/21 0826 06/14/21 1124  GLUCAP 337* 167* 60* 87 150*    Iron Studies: No results for input(s): IRON, TIBC, TRANSFERRIN, FERRITIN in the last 72 hours. $RemoveB'@lablastinr3'PaHobVbb$ @ Studies/Results: No results found. Medications:  sodium chloride 10 mL/hr at 06/06/21 1700    ceFAZolin (ANCEF) IV Stopped (06/13/21 1657)    apixaban  5 mg Oral BID   chlorhexidine gluconate (MEDLINE KIT)  15 mL Mouth Rinse BID   Chlorhexidine Gluconate Cloth  6 each Topical Q0600   darbepoetin (ARANESP) injection - NON-DIALYSIS  200 mcg Subcutaneous Q Thu-1800   insulin aspart  0-20 Units Subcutaneous Q4H   levothyroxine  100 mcg Oral QAC breakfast   multivitamin  1 tablet Oral QHS   polyethylene glycol  17 g Oral Daily   sodium chloride flush  3 mL Intravenous Q12H     AOP HD: [facility] MWF  4h  450 bfr  123kg  2/2 bath  LU AVF   Hep none  - mircera 225 mcg every 2 weeks - last received 200 mcg on 12/14  - venofer 100 mg (ordered for 10 doses)  - no vdra    Assessment/Plan:  AHRF/ COVID pna - was on vent 1/05 to 1/08.  Scattered infiltrates by CXR c/w pna. Improved.  ESRD - Usual HD MWF. Had HD Sun- Tues - Thurs this week. For dc to SNF today. Labs and vol are very good. OK for him to go to next HD on Monday as outpatient. Have d/w pmd. OK for dc.  Encephalopathy - d/t acute illness, COVID, esrd. Improved.  BP/ volume - BP's soft, on midodrine. 5-7kg under prior dry wt Anemia ckd -  Ferritin 5300 so defering iron. On aranesp 200 mcg weekly on wed. SP PRBC's on 1/7 and 1/8 MBD ckd - hyperphos. on [place] and HD. No recent PO4. Add to labs today.  Covid PNA-  per primary team. S/p remdesivir  Atrial Fibrillation w/ RVR - S/p amio   Kelly Splinter, MD 06/14/2021,  11:57 AM

## 2021-06-14 NOTE — TOC Transition Note (Signed)
Transition of Care [facility]) - CM/SW Discharge Note   Patient Details  Name: Richard Hall MRN: [id number] Date of Birth: [birth year]  Transition [facility]) CM/SW Contact:  Tresa Endo Phone Number: 06/14/2021, 12:00 PM   Clinical Narrative:    Patient will DC to: [facility] Anticipated DC date: 06/14/2021 Family notified: Pt requested not to follow up with family Transport by: Corey Harold   Per MD patient ready for DC to [facility] in [place] room 106. RN to call report prior to discharge [phone]). RN, patient, patient's family, and facility notified of DC. Discharge Summary and FL2 sent to facility. DC packet on chart. Ambulance transport requested for patient.   CSW will sign off for now as social work intervention is no longer needed. Please consult [place] again if new needs arise.       Barriers to Discharge: Continued Medical Work up   Patient Goals and CMS Choice   CMS Medicare.gov Compare Post Acute Care list provided to:: Patient Represenative (must comment) (spoke with patients son [name]) Choice offered to / list presented to : Adult Children [name])  Discharge Placement                       Discharge Plan and Services In-house Referral: Clinical Social Work                                   Social Determinants of Health (SDOH) Interventions     Readmission Risk Interventions Readmission Risk Prevention Plan 01/12/2019  Transportation Screening Complete  PCP or Specialist Appt within 3-5 Days Complete  HRI or [place] Complete  Social Work Consult for [place] Planning/Counseling Complete  Palliative Care Screening Not Applicable  Medication Review Press photographer) Complete  Some recent data might be hidden

## 2021-06-14 NOTE — Progress Notes (Signed)
PHARMACY CONSULT NOTE FOR:  OUTPATIENT  PARENTERAL ANTIBIOTIC THERAPY (OPAT)  Indication: MSSA bacteremia Regimen:  Cefazolin 2 g IV every Monday, Wednesday and Friday after each hemodialysis session  End date: 07/17/21 to complete 6 weeks total antibiotic therapy.  IV antibiotic discharge orders are pended. To discharging provider:  please sign these orders via discharge navigator,  Select New Orders & click on the button choice - Manage This Unsigned Work.     Thank you for allowing pharmacy to be a part of this patient's care.  Nicole Cella, RPh Clinical Pharmacist 06/14/2021, 12:44 PM

## 2021-06-14 NOTE — Progress Notes (Signed)
Occupational Therapy Treatment Patient Details Name: Richard Hall MRN: [id number] DOB: 04-09-1956 Today's Date: 06/14/2021   History of present illness Pt adm 12/30 with SOB. Found to have afib with rvr, acute on chronic respiratory failure with hypoxia, chf exacerbation, and Covid. On 1/3 pt with decr responsiveness and started on bipap and transferred to ICU. Increased AMS and lethargy on 06/06/21, intubated. CT found chronic non healed lt femur fx despite ORIF 2 years ago. Pt made NWB on LLE. PMH - ESRD on HD, CHf, afib, DM, neuropathy, HTN, CVA, metastatic prostate CA, lt femur ORIF.   OT comments  Pt making slow progress with functional goals. Pt continues to be limited by Poor activity tolerance/endurance and significant weakness. Pt eager to d/c to SNF to begin post acute rehab   Recommendations for follow up therapy are one component of a multi-disciplinary discharge planning process, led by the attending physician.  Recommendations may be updated based on patient status, additional functional criteria and insurance authorization.    Follow Up Recommendations  Skilled nursing-short term rehab (<3 hours/day)    Assistance Recommended at Discharge Frequent or constant Supervision/Assistance  Patient can return home with the following  Two people to help with walking and/or transfers;Two people to help with bathing/dressing/bathroom   Equipment Recommendations  Other (comment) (TBD at SNF)    Recommendations for Other Services      Precautions / Restrictions Precautions Precautions: Fall Restrictions Weight Bearing Restrictions: Yes LLE Weight Bearing: Non weight bearing       Mobility Bed Mobility Overal bed mobility: Needs Assistance Bed Mobility: Rolling Rolling: Max assist   Supine to sit: Max assist;HOB elevated          Transfers                         Balance Overall balance assessment: Needs assistance   Sitting balance-Leahy Scale: Poor                                      ADL either performed or assessed with clinical judgement   ADL Overall ADL's : Needs assistance/impaired     Grooming: Wash/dry hands;Wash/dry face;Brushing hair;Sitting;Bed level           Upper Body Dressing : Moderate assistance;Bed level           Toileting- Clothing Manipulation and Hygiene: Total assistance;Bed level         General ADL Comments: pt limited by cognition and significant weakness and poor activity tolerance    Extremity/Trunk Assessment Upper Extremity Assessment Upper Extremity Assessment: Generalized weakness   Lower Extremity Assessment Lower Extremity Assessment: Defer to PT evaluation        Vision Baseline Vision/History: 1 Wears glasses Ability to See in Adequate Light: 0 Adequate Patient Visual Report: No change from baseline     Perception     Praxis      Cognition Arousal/Alertness: Awake/alert Behavior During Therapy: Flat affect Overall Cognitive Status: Impaired/Different from baseline Area of Impairment: Attention;Memory;Following commands;Awareness;Problem solving;Safety/judgement                     Memory: Decreased short-term memory Following Commands: Follows one step commands with increased time;Follows one step commands inconsistently Safety/Judgement: Decreased awareness of deficits;Decreased awareness of safety   Problem Solving: Slow processing;Decreased initiation;Requires tactile cues;Requires verbal cues;Difficulty sequencing  Exercises     Shoulder Instructions       General Comments      Pertinent Vitals/ Pain       Pain Assessment: Faces Faces Pain Scale: Hurts a little bit Facial Expression: Relaxed, neutral Body Movements: Absence of movements Pain Location: legs with mobility Pain Descriptors / Indicators: Discomfort;Grimacing;Guarding Pain Intervention(s): Monitored during session;Repositioned  Home Living                                           Prior Functioning/Environment              Frequency  Min 2X/week        Progress Toward Goals  OT Goals(current goals can now be found in the care plan section)  Progress towards OT goals: OT to reassess next treatment     Plan Discharge plan remains appropriate    Co-evaluation                 AM-PAC OT "6 Clicks" Daily Activity     Outcome Measure   Help from another person eating meals?: None Help from another person taking care of personal grooming?: A Little Help from another person toileting, which includes using toliet, bedpan, or urinal?: Total Help from another person bathing (including washing, rinsing, drying)?: A Lot Help from another person to put on and taking off regular upper body clothing?: A Little Help from another person to put on and taking off regular lower body clothing?: Total 6 Click Score: 14    End of Session    OT Visit Diagnosis: Muscle weakness (generalized) (M62.81);Other symptoms and signs involving cognitive function;Other abnormalities of gait and mobility (R26.89);Pain Pain - part of body:  (LEs with bed mobility)   Activity Tolerance Patient limited by fatigue   Patient Left in bed;with bed alarm set;with call bell/phone within reach   Nurse Communication          Time: 1201-1218 OT Time Calculation (min): 17 min  Charges: OT General Charges $OT Visit: 1 Visit OT Treatments $Self Care/Home Management : 8-22 mins    [name] 06/14/2021, 3:11 PM

## 2021-06-14 NOTE — Progress Notes (Incomplete)
Discharge to [facility] via EMS.  IV access discontinued.  VS taken prior to discharge.  No belongs available to send.  Patient aware.

## 2021-06-14 NOTE — Discharge Summary (Addendum)
Physician Discharge Summary  Zerrick Hanssen ENI:[id number] DOB: 03/18/1956 DOA: 05/31/2021  PCP: [name], MD  Admit date: 05/31/2021 Discharge date: 06/14/2021  Admitted From: Home  Disposition:   SNF  Recommendations for Outpatient Follow-up and new medication changes:  Follow up with Dr [name] in 7 to 10 days. Continue renal replacement therapy as scheduled per nephrology, follow up on Na levels.  Patient is off isolation Continue hemodialysis Monday, Wednesday and Friday.  Continue antibiotic therapy until 07/17/21 to complete 6 weeks for MSSA bacteremia, to be given after hemodialysis.   Home Health: na   Equipment/Devices: na    Discharge Condition: stable  CODE STATUS: full  Diet recommendation: heart healthy   Brief/Interim Summary: Mr. Kinner was admitted to the hospital with the working diagnosis of MSSA bacteremia and septic shock in the setting of SARS COVID 19 viral pneumonia.    66 yo male with the past medical history of anemia, atrial fibrillation, congested heart failure, ERSD on HD, chronic respiratory failure, T2DM, neuropathy, dyslipidemia, HTN, hypothyroid, CVA, and prostate cancer who presented with dyspnea on dialysis.  Patient reported severe dyspnea at the dialysis unit after completing 4 hours of renal replacement therapy. Patient developed significant respiratory distress and was transported to the hospital. He required non invasive mechanical ventilation with Bipap. Apparently patient was noted to have dyspnea for a few days prior. On rout patient developed atrial fibrillation with RVR. HR 130 to 140, Blood pressure 120/87, RR 33, oxygen saturation 99% on Bipap. Patient with increased work of breathing, heart S1 and S2 present and tachycardic, lungs with rales  bilaterally, abdomen soft and no lower extremity edema.    Sodium 134, potassium 3.9, chloride 91, bicarb 27, glucose 93, BUN 49, creatinine 5.0, AST 463, ALT 229, high sensitive troponin  707, White cell count 12.1, hemoglobin 8.7, hematocrit 27.2, platelets 269.  SARS COVID 19 positive   Chest radiograph with cardiomegaly and hilar vascular congestion more prominent on the right side.    EKG with 151 bpm, right axis deviation, normal qtc, atrial fibrillation rhythm, with poor R wave progression, no significant ST segment or T wave changes.    Patient was placed on supplemental 02, systemic steroids and antiviral therapy.    01/03 patient developed acute hypercapnic respiratory failure and required invasive mechanical ventilation.    Blood cultures positive for MSSA, consulted ID with recommendations to continue antibiotic therapy on HD for total of 6 weeks.  TEE negative for vegetations.    01/09 patient was liberated from mechanical ventilation    01/10 transferred back to [facility].    Patient clinically improving, 01/12 discontinue Ng tube. Patient very weak and deconditioned, plan to transfer to SNF.  Continue outpatient renal replacement therapy.    Discharge Diagnoses:  Principal Problem:   Atrial fibrillation with RVR ([place]) Active Problems:   OSA on CPAP   Hypothyroidism (acquired)   Essential hypertension   Dyslipidemia   Diabetes mellitus with end-stage renal disease (HCC)   ESRD (end stage renal disease) on dialysis [facility])   Atrial fibrillation (HCC)   Personal history of transient ischemic attack (TIA), and cerebral infarction without residual deficits   Acute on chronic respiratory failure with hypoxia (HCC)   CHF exacerbation (HCC)   COVID-19 virus infection   Elevated troponin   Chronic obstructive pulmonary disease with acute lower respiratory infection (HCC)   MSSA bacteremia   Encephalopathy acute   Malnutrition of moderate degree   Respiratory failure (HCC)   Pressure  injury of skin      Acute hypoxemic and hypercapnic respiratory failure due to SARS COVID 19 viral pneumonia/ acute diastolic heart failure exacerbation. COPD  exacerbation Patient had a prolonged hospitalization.  He received treatment for COVID 19 viral pneumonia with systemic steroids and antiviral therapy with remdesivir. His hypervolemia was managed with ultrafiltration with hemodialysis.   He required invasive mechanical ventilation, liberated on 06/10/21.    At the time of his discharge his oxygen saturation is 99%  on room air.  Patient very weak and deconditioned will need SNF for continue therapy.    2. MSSA bacteremia complicated with septic shock (present on admission). No endocarditis per TEE, follow up cultures with no growth.  Patient required vasopressors for septic shock,   Plan to continue with antibiotic therapy until  07/17/21 to be given on HD, to complete 6 weeks.    3. Metabolic encephalopathy (acute) / moderate calorie protein malnutrition.   Encephalopathy due to acute critical illness, respiratory failure and shock. Further work up with head CT with acute changes. Patient required tube feedings for nutritional support.  Now NG tube has been removed and he is tolerating po well with aspiration precautions.   4. Chronic atrial fibrillation with RVR Patient was placed on amiodarone drip for rate control and continue anticoagulation with apixaban. Clinically improved and amiodarone was discontinued.  Now is rate control with metoprolol.   Further work up with echocardiography showed preserved LV systolic function with EF 60 to 65%, RV systolic function preserved.    5. Obesity class 1. Stage coccyx pressure ulcer not known if present on admission.   BMI is 34.6,  Continue local skin care  Continue with nutritional supplements.  6.  ESRD/ hyponatremia. Patient received renal replacement therapy during his hospitalization. His volume has improved and will continue HD on Monday, Wednesday and Friday schedule. His sodium is improving, will need close follow up as outpatient.  Discharge sodium is 128. Patient had HD  yesterday 01/12 and will continue next HD session on 06/17/21, cased discussed with nephrology     7. T2DM.  Patient was placed on insulin therapy for glucose control and monitoring. He received sliding scale short acting insulin.  At discharge his fasting glucose is 185, capillary has been 60, 87 and 150. Continue glucose monitoring as outpatient  At discharge will resume basal insulin at a reduced dose of 10 units daily and close follow up on capillary glucose.   8. Hypothyroid. Continue with levothyroxine.   9. Prostate cancer. Continue home regimen enzalutamide.   Discharge Instructions   Allergies as of 06/14/2021       Reactions   Gabapentin Swelling        Medication List     STOP taking these medications    ferric citrate 1 GM 210 MG(Fe) tablet Commonly known as: Auryxia   fluticasone 50 MCG/ACT nasal spray Commonly known as: Flonase Allergy Relief   glipiZIDE 10 MG tablet Commonly known as: GLUCOTROL   Linzess 72 MCG capsule Generic drug: linaclotide   Toujeo SoloStar 300 UNIT/ML Solostar Pen Generic drug: insulin glargine (1 Unit Dial)   traMADol 50 MG tablet Commonly known as: Ultram       TAKE these medications    acetaminophen 500 MG tablet Commonly known as: TYLENOL Take 500-1,000 mg by mouth every 6 (six) hours as needed (for pain.).   amitriptyline 100 MG tablet Commonly known as: ELAVIL Take 1 tablet (100 mg total) by mouth at bedtime.  atorvastatin 80 MG tablet Commonly known as: LIPITOR Take 80 mg by mouth daily.   ceFAZolin  IVPB Commonly known as: ANCEF Inject 2 g into the vein every Monday, Wednesday, and Friday at 6 PM. Indication: MSSA bacteremia. Give cefazolin IV dose after each hemodialysis session MWF. First Dose: No Last Day of Therapy:  07/17/2021 Labs - Once weekly:  CBC/D and BMP, Labs - Every other week:  ESR and CRP Method of administration: IV Push Start taking on: June 17, 2021   diphenoxylate-atropine  2.5-0.025 MG tablet Commonly known as: LOMOTIL Take 1 tablet by mouth 4 (four) times daily as needed for diarrhea or loose stools.   Eliquis 2.5 MG Tabs tablet Generic drug: apixaban Take 2.5 mg by mouth 2 (two) times daily.   Euthyrox 88 MCG tablet Generic drug: levothyroxine Take 88 mcg by mouth daily before breakfast.   levothyroxine 100 MCG tablet Commonly known as: SYNTHROID Take 100 mcg by mouth daily.   furosemide 80 MG tablet Commonly known as: LASIX Take 1 tablet (80 mg total) by mouth 4 (four) times a week. Mondays, wednesdays, fridays, and $RemoveBefo'sundays What changed:  when to take this additional instructions   insulin glargine-yfgn 100 UNIT/ML injection Commonly known as: SEMGLEE Inject 0.1 mLs (10 Units total) into the skin at bedtime. Start taking on: June 15, 2021   leuprolide acetate (6 Month) 45 MG injection Generic drug: leuprolide (6 Month) Inject 45 mg into the skin every 6 (six) months.   lidocaine-prilocaine cream Commonly known as: EMLA Apply 1 application topically See admin instructions. Apply small amount to access site 1-2 hours before dialysis, cover with occlusive dressing (saran wrap)   loperamide 2 MG capsule Commonly known as: IMODIUM Take 1 capsule (2 mg total) by mouth every 6 (six) hours as needed for diarrhea or loose stools.   metoprolol tartrate 25 MG tablet Commonly known as: LOPRESSOR Take 25 mg by mouth 2 (two) times daily.   multivitamin Tabs tablet Take 1 tablet by mouth at bedtime.   sertraline 100 MG tablet Commonly known as: ZOLOFT Take 100 mg by mouth daily.   Xtandi 40 MG capsule Generic drug: enzalutamide Take 160 mg by mouth at bedtime.               Discharge Care Instructions  (From admission, onward)           Start     Ordered   06/14/21 0000  Change dressing on IV access line weekly and PRN  (Home infusion instructions - [facility] )        01'[id number]$ /13/23 1440            Allergies   Allergen Reactions   Gabapentin Swelling    Consultations: Nephrology  Cardiology for TEE ID    Procedures/Studies: DG Knee 1-2 Views Left  Result Date: 06/06/2021 CLINICAL DATA:  Left knee pain EXAM: LEFT KNEE - 1-2 VIEW COMPARISON:  05/20/2019 FINDINGS: Chronic fracture distal left femur. Persistent fracture line is present however there has been progressive healing since the prior study. There is mild medial displacement. ORIF with lateral plate and screws unchanged from the prior study. Intramedullary rod also present and unchanged. Vascular calcification. Advanced degenerative change in the knee most severe in the medial joint space. IMPRESSION: Fracture distal femur shows partial healing since 2020. Mild medial displacement unchanged. ORIF hardware in satisfactory position. Electronically Signed   By: Franchot Gallo M.D.   On: 06/06/2021 16:05   CT HEAD WO  CONTRAST (5MM)  Result Date: 06/04/2021 CLINICAL DATA:  Mental status change, unknown cause; esrd, worsening lethargy and stuporous. Covid positive. EXAM: CT HEAD WITHOUT CONTRAST TECHNIQUE: Contiguous axial images were obtained from the base of the skull through the vertex without intravenous contrast. COMPARISON:  August 2020 FINDINGS: Brain: There is no acute intracranial hemorrhage, mass effect, or edema. Gray-white differentiation is preserved. Prominence of the ventricles and sulci reflects minor parenchymal volume loss. Patchy hypoattenuation in the supratentorial white matter is nonspecific but probably reflects similar mild chronic microvascular ischemic changes. There are a few small vessel infarcts of the basal ganglia bilaterally, at least some of which are present previously. Vascular: No hyperdense vessel.There is atherosclerotic calcification at the skull base. Skull: Calvarium is unremarkable. Sinuses/Orbits: No acute finding. Minor paranasal sinus mucosal thickening. Other: Mild patchy right mastoid tip opacification.  IMPRESSION: No acute intracranial hemorrhage or definite evidence of acute infarction. There are few small vessel infarcts of the basal ganglia bilaterally, but at least some were present previously. Electronically Signed   By: Macy Mis M.D.   On: 06/04/2021 14:35   MR KNEE LEFT WO CONTRAST  Result Date: 06/08/2021 CLINICAL DATA:  Bacteremia. Concern for left knee infection. EXAM: MRI OF THE LEFT KNEE WITHOUT CONTRAST TECHNIQUE: Multiplanar, multisequence MR imaging of the knee was performed. No intravenous contrast was administered. COMPARISON:  Left knee x-rays dated June 06, 2021. FINDINGS: MENISCI Medial meniscus: Large radial tear of the anterior horn, body, and posterior horn. Lateral meniscus:  Small radial tear of the body. LIGAMENTS Cruciates:  Intact ACL and PCL. Collaterals: Medial collateral ligament is intact. Lateral collateral ligament complex is intact. CARTILAGE Patellofemoral:  Mild partial-thickness cartilage loss. Medial: Extensive full-thickness cartilage loss over the weight-bearing medial femoral condyle and medial tibial plateau. Lateral: Scattered partial-thickness cartilage loss. Full-thickness cartilage loss over the mesial aspect of the lateral femoral condyle. Joint:  Small joint effusion. Scarring in Hoffa's fat. Popliteal Fossa:  Tiny Baker cyst. Intact popliteus tendon. Extensor Mechanism: Intact quadriceps tendon and patellar tendon. Intact medial and lateral patellar retinaculum. Intact MPFL. Bones: Chronic incompletely healed distal femoral metadiaphyseal fracture status post ORIF. No suspicious marrow signal abnormality. Tricompartmental marginal osteophytes. Other: None. IMPRESSION: 1. No evidence of osteomyelitis. 2. Small joint effusion, nonspecific, although favored to be related to underlying advanced tricompartmental osteoarthritis and less likely infection given lack of inflammatory changes about the knee. 3. Large radial tear of the medial meniscus anterior  horn, body, and posterior horn. 4. Small radial tear of the lateral meniscus body. 5. Chronic incompletely healed distal femoral metadiaphyseal fracture status post ORIF. Electronically Signed   By: Titus Dubin M.D.   On: 06/08/2021 13:33   DG CHEST PORT 1 VIEW  Result Date: 06/06/2021 CLINICAL DATA:  Shortness of breath.  COVID-19 positive. EXAM: PORTABLE CHEST 1 VIEW COMPARISON:  06/04/2021 FINDINGS: Feeding tube extends beyond the inferior aspect of the film. Endotracheal tube terminates 4.7 cm above carina. Midline trachea. Cardiomegaly accentuated by AP portable technique. Atherosclerosis in the transverse aorta. No pleural effusion or pneumothorax. Bilateral, peripheral predominant interstitial opacities are improved. IMPRESSION: Improved peripheral predominant interstitial opacities, which given the clinical history, likely represent COVID-19 pneumonia. Endotracheal tube 4.7 cm above carina. Aortic Atherosclerosis (ICD10-I70.0). Cardiomegaly without congestive failure. Electronically Signed   By: Abigail Miyamoto M.D.   On: 06/06/2021 16:03   DG CHEST PORT 1 VIEW  Result Date: 06/04/2021 CLINICAL DATA:  Difficulty breathing COVID positive EXAM: PORTABLE CHEST 1 VIEW COMPARISON:  05/31/2021 FINDINGS: Transverse  diameter of heart is increased. There are small patchy infiltrates in the right parahilar region and right lower lung fields. There is no focal consolidation. There is no significant pleural effusion or pneumothorax. IMPRESSION: There are small patchy infiltrates in right parahilar region and right lower lung fields suggesting possible pneumonia. There is no focal pulmonary consolidation. There is no pleural effusion or pneumothorax. Cardiomegaly.  There are no signs of alveolar pulmonary edema. Electronically Signed   By: Elmer Picker M.D.   On: 06/04/2021 13:54   DG Chest Port 1 View  Result Date: 05/31/2021 CLINICAL DATA:  Shortness of breath EXAM: PORTABLE CHEST 1 VIEW COMPARISON:   01/05/2019 FINDINGS: Transverse diameter of heart is increased. Increased interstitial markings are seen in the right parahilar region and right lower lung fields. There is no focal consolidation. Lateral CP angles are not included in their entirety. There is no significant pleural effusion or pneumothorax. IMPRESSION: Cardiomegaly. Increased interstitial markings are seen in the right parahilar region and right lower lung fields. This may suggest asymmetric pulmonary edema or interstitial pneumonitis. Electronically Signed   By: Elmer Picker M.D.   On: 05/31/2021 17:09   DG Abd Portable 1V  Result Date: 06/10/2021 CLINICAL DATA:  Feeding tube placement EXAM: PORTABLE ABDOMEN - 1 VIEW COMPARISON:  Portable exam 1416 hours compared to 06/09/2021 FINDINGS: Feeding tube extends into the proximal stomach. Normal bowel gas pattern. Bones demineralized. IMPRESSION: Feeding tube extends into proximal stomach. Electronically Signed   By: Lavonia Dana M.D.   On: 06/10/2021 14:36   DG Abd Portable 1V  Result Date: 06/09/2021 CLINICAL DATA:  Placement of gastrointestinal device EXAM: PORTABLE ABDOMEN - 1 VIEW COMPARISON:  06/05/2021 FINDINGS: Enteric tube tip overlies the proximal stomach, it is retracted since 06/05/2021. Probable airspace disease at the left base. IMPRESSION: Esophageal tube tip overlies the proximal stomach Electronically Signed   By: Donavan Foil M.D.   On: 06/09/2021 16:52   DG Abd Portable 1V  Result Date: 06/05/2021 CLINICAL DATA:  Encounter for feeding tube placement. EXAM: PORTABLE ABDOMEN - 1 VIEW COMPARISON:  None. FINDINGS: The bowel gas pattern is normal. Weighted tip feeding tube with distal tip in the pyloric region. No radio-opaque calculi or other significant radiographic abnormality are seen. IMPRESSION: Feeding tube in satisfactory position. Electronically Signed   By: Keane Police D.O.   On: 06/05/2021 14:52   ECHOCARDIOGRAM COMPLETE  Result Date: 06/01/2021     ECHOCARDIOGRAM REPORT   Patient Name:   Jansel RAY Harwood Date of Exam: 06/01/2021 Medical Rec #:  [id number]        Height:       72.0 in Accession #:    [id number]       Weight:       263.2 lb Date of Birth:  1956-04-28         BSA:          2.394 m Patient Age:    66 years         BP:           126/80 mmHg Patient Gender: M                HR:           91 bpm. Exam Location:  Inpatient Procedure: 2D Echo, Cardiac Doppler and Color Doppler Indications:    Dsypnea  History:        Patient has no prior history of Echocardiogram examinations.  Covid 19 positive.  Sonographer:    Roosvelt Maser RDCS Referring Phys: [id number] Cecille Po MELVIN IMPRESSIONS  1. Left ventricular ejection fraction, by estimation, is 60 to 65%. The left ventricle has normal function. The left ventricle has no regional wall motion abnormalities. There is mild left ventricular hypertrophy. Left ventricular diastolic parameters were normal.  2. Right ventricular systolic function is normal. The right ventricular size is normal. There is moderately elevated pulmonary artery systolic pressure.  3. Left atrial size was moderately dilated.  4. There is significant annular calcification and calcification/thickening of mitrla leaflets PISA radius 1.1 with PISA ERO 55 mm 2 suggest severe MR Consider TEE if clinically indicated when acute COVID respiratory precautions done . The mitral valve is abnormal. Severe mitral valve regurgitation. No evidence of mitral stenosis. Moderate mitral annular calcification.  5. The aortic valve is tricuspid. There is moderate calcification of the aortic valve. There is moderate thickening of the aortic valve. Aortic valve regurgitation is mild. Aortic valve sclerosis/calcification is present, without any evidence of aortic stenosis.  6. Aortic dilatation noted. There is moderate dilatation of the ascending aorta, measuring 43 mm.  7. The inferior vena cava is normal in size with greater than 50% respiratory  variability, suggesting right atrial pressure of 3 mmHg. FINDINGS  Left Ventricle: Left ventricular ejection fraction, by estimation, is 60 to 65%. The left ventricle has normal function. The left ventricle has no regional wall motion abnormalities. The left ventricular internal cavity size was normal in size. There is  mild left ventricular hypertrophy. Left ventricular diastolic parameters were normal. Right Ventricle: The right ventricular size is normal. No increase in right ventricular wall thickness. Right ventricular systolic function is normal. There is moderately elevated pulmonary artery systolic pressure. The tricuspid regurgitant velocity is 3.16 m/s, and with an assumed right atrial pressure of 15 mmHg, the estimated right ventricular systolic pressure is 54.9 mmHg. Left Atrium: Left atrial size was moderately dilated. Right Atrium: Right atrial size was normal in size. Pericardium: There is no evidence of pericardial effusion. Mitral Valve: There is significant annular calcification and calcification/thickening of mitrla leaflets PISA radius 1.1 with PISA ERO 55 mm 2 suggest severe MR Consider TEE if clinically indicated when acute COVID respiratory precautions done. The mitral valve is abnormal. There is moderate thickening of the mitral valve leaflet(s). There is moderate calcification of the mitral valve leaflet(s). Moderate mitral annular calcification. Severe mitral valve regurgitation. No evidence of mitral valve stenosis. Tricuspid Valve: The tricuspid valve is normal in structure. Tricuspid valve regurgitation is mild . No evidence of tricuspid stenosis. Aortic Valve: The aortic valve is tricuspid. There is moderate calcification of the aortic valve. There is moderate thickening of the aortic valve. Aortic valve regurgitation is mild. Aortic regurgitation PHT measures 537 msec. Aortic valve sclerosis/calcification is present, without any evidence of aortic stenosis. Pulmonic Valve: The pulmonic  valve was normal in structure. Pulmonic valve regurgitation is not visualized. No evidence of pulmonic stenosis. Aorta: Aortic dilatation noted. There is moderate dilatation of the ascending aorta, measuring 43 mm. Venous: The inferior vena cava is normal in size with greater than 50% respiratory variability, suggesting right atrial pressure of 3 mmHg. IAS/Shunts: No atrial level shunt detected by color flow Doppler.  LEFT VENTRICLE PLAX 2D LVIDd:         5.90 cm LVIDs:         4.70 cm LV PW:         1.40 cm LV IVS:  1.30 cm  RIGHT VENTRICLE          IVC RV Basal diam:  4.20 cm  IVC diam: 2.30 cm RV Mid diam:    3.80 cm LEFT ATRIUM            Index        RIGHT ATRIUM           Index LA diam:      5.00 cm  2.09 cm/m   RA Area:     27.00 cm LA Vol (A2C): 146.0 ml 60.98 ml/m  RA Volume:   94.30 ml  39.38 ml/m LA Vol (A4C): 151.0 ml 63.06 ml/m  AORTIC VALVE LVOT Vmax:   76.20 cm/s LVOT Vmean:  52.100 cm/s LVOT VTI:    0.149 m AI PHT:      537 msec  AORTA Ao Root diam: 4.10 cm Ao Asc diam:  4.30 cm MR Peak grad:    101.6 mmHg   TRICUSPID VALVE MR Mean grad:    64.0 mmHg    TR Peak grad:   39.9 mmHg MR Vmax:         504.00 cm/s  TR Vmax:        316.00 cm/s MR Vmean:        378.0 cm/s MR PISA:         7.60 cm     SHUNTS MR PISA Eff ROA: 55 mm       Systemic VTI: 0.15 m MR PISA Radius:  1.10 cm Jenkins Rouge MD Electronically signed by Jenkins Rouge MD Signature Date/Time: 06/01/2021/11:52:58 AM    Final    CT MAXILLOFACIAL WO CONTRAST  Result Date: 06/06/2021 CLINICAL DATA:  dental carries EXAM: CT MAXILLOFACIAL WITHOUT CONTRAST TECHNIQUE: Multidetector CT imaging of the maxillofacial structures was performed. Multiplanar CT image reconstructions were also generated. COMPARISON:  CT head 06/04/2021. FINDINGS: Osseous: No fracture or mandibular dislocation. No destructive process. Dental: No maxillary teeth. Small periapical lucency of the left medial mandibular incisor. Small dental caries, including of the  incisors. Orbits: Remote appearing right medial orbital wall fracture. Sinuses: Mild mucosal thickening of the inferior left maxillary sinus and scattered ethmoid air cells. No air-fluid levels. Soft tissues: Negative. Limited intracranial: Better characterized on recent CT head from 06/04/2021. IMPRESSION: 1. Remote appearing right medial orbital wall fracture. 2. Missing maxillary teeth and small dental caries, as above. No evidence of surrounding inflammatory change or abscess. Electronically Signed   By: Margaretha Sheffield M.D.   On: 06/06/2021 12:41     Procedures: TEE   Subjective: Patient is feeling better, no nausea or vomiting, no dyspnea or chest pain, continue to be very weak and deconditioned   Discharge Exam: Vitals:   06/14/21 0400 06/14/21 1127  BP: 111/62 104/61  Pulse: 69 71  Resp: 20 20  Temp: 97.9 F (36.6 C) 98 F (36.7 C)  SpO2: 95% 99%   Vitals:   06/13/21 2040 06/13/21 2134 06/14/21 0400 06/14/21 1127  BP: 111/62 (!) 119/58 111/62 104/61  Pulse: 81  69 71  Resp:  $Remo'19 20 20  'ivibU$ Temp:  97.7 F (36.5 C) 97.9 F (36.6 C) 98 F (36.7 C)  TempSrc:  Oral Oral Oral  SpO2: 100% 100% 95% 99%  Weight:   117.6 kg   Height:        General: Not in pain or dyspnea.  Neurology: Awake and alert, non focal  E ENT: no pallor, no icterus, oral mucosa moist Cardiovascular: No JVD. S1-S2  present, rhythmic, no gallops, rubs, or murmurs. No lower extremity edema. Pulmonary: positive breath sounds bilaterally, with no wheezing, rhonchi or rales. Gastrointestinal. Abdomen soft and non tender Skin. No rashes Musculoskeletal: no joint deformities   The results of significant diagnostics from this hospitalization (including imaging, microbiology, ancillary and laboratory) are listed below for reference.     Microbiology: Recent Results (from the past 240 hour(s))  Culture, blood (routine x 2)     Status: Abnormal   Collection Time: 06/05/21  1:11 AM   Specimen: BLOOD  Result  Value Ref Range Status   Specimen Description BLOOD RIGHT ANTECUBITAL  Final   Special Requests   Final    BOTTLES DRAWN AEROBIC ONLY Blood Culture adequate volume   Culture  Setup Time   Final    GRAM POSITIVE COCCI IN CLUSTERS AEROBIC BOTTLE ONLY CRITICAL RESULT CALLED TO, READ BACK BY AND VERIFIED WITH: Sarajane Marek [id number] AT 1442 BY CM Performed at [facility], [place] [address] [place], [place] [address]    Culture STAPHYLOCOCCUS AUREUS (A)  Final   Report Status 06/07/2021 FINAL  Final   Organism ID, Bacteria STAPHYLOCOCCUS AUREUS  Final      Susceptibility   Staphylococcus aureus - MIC*    CIPROFLOXACIN <=0.5 SENSITIVE Sensitive     ERYTHROMYCIN RESISTANT Resistant     GENTAMICIN <=0.5 SENSITIVE Sensitive     OXACILLIN 0.5 SENSITIVE Sensitive     TETRACYCLINE <=1 SENSITIVE Sensitive     VANCOMYCIN 1 SENSITIVE Sensitive     TRIMETH/SULFA <=10 SENSITIVE Sensitive     CLINDAMYCIN RESISTANT Resistant     RIFAMPIN <=0.5 SENSITIVE Sensitive     Inducible Clindamycin POSITIVE Resistant     * STAPHYLOCOCCUS AUREUS  Culture, blood (routine x 2)     Status: Abnormal   Collection Time: 06/05/21  1:11 AM   Specimen: BLOOD RIGHT HAND  Result Value Ref Range Status   Specimen Description BLOOD RIGHT HAND  Final   Special Requests   Final    BOTTLES DRAWN AEROBIC ONLY Blood Culture adequate volume   Culture  Setup Time   Final    GRAM POSITIVE COCCI IN CLUSTERS AEROBIC BOTTLE ONLY    Culture (A)  Final    STAPHYLOCOCCUS AUREUS SUSCEPTIBILITIES PERFORMED ON PREVIOUS CULTURE WITHIN THE LAST 5 DAYS. Performed at [facility], [place] [address] [place], [place] [address]    Report Status 06/07/2021 FINAL  Final  Blood Culture ID Panel (Reflexed)     Status: Abnormal   Collection Time: 06/05/21  1:11 AM  Result Value Ref Range Status   Enterococcus faecalis NOT DETECTED NOT DETECTED Final   Enterococcus Faecium NOT DETECTED NOT DETECTED Final   Listeria monocytogenes NOT  DETECTED NOT DETECTED Final   Staphylococcus species DETECTED (A) NOT DETECTED Final    Comment: CRITICAL RESULT CALLED TO, READ BACK BY AND VERIFIED WITH: PHARMD J FRENS [id number] AT 1441 BY CM    Staphylococcus aureus (BCID) DETECTED (A) NOT DETECTED Final    Comment: CRITICAL RESULT CALLED TO, READ BACK BY AND VERIFIED WITH: PHARMD J FRENS [id number] AT 1441 BY CM    Staphylococcus epidermidis NOT DETECTED NOT DETECTED Final   Staphylococcus lugdunensis NOT DETECTED NOT DETECTED Final   Streptococcus species NOT DETECTED NOT DETECTED Final   Streptococcus agalactiae NOT DETECTED NOT DETECTED Final   Streptococcus pneumoniae NOT DETECTED NOT DETECTED Final   Streptococcus pyogenes NOT DETECTED NOT DETECTED Final   A.calcoaceticus-baumannii NOT DETECTED  NOT DETECTED Final   Bacteroides fragilis NOT DETECTED NOT DETECTED Final   Enterobacterales NOT DETECTED NOT DETECTED Final   Enterobacter cloacae complex NOT DETECTED NOT DETECTED Final   Escherichia coli NOT DETECTED NOT DETECTED Final   Klebsiella aerogenes NOT DETECTED NOT DETECTED Final   Klebsiella oxytoca NOT DETECTED NOT DETECTED Final   Klebsiella pneumoniae NOT DETECTED NOT DETECTED Final   Proteus species NOT DETECTED NOT DETECTED Final   Salmonella species NOT DETECTED NOT DETECTED Final   Serratia marcescens NOT DETECTED NOT DETECTED Final   Haemophilus influenzae NOT DETECTED NOT DETECTED Final   Neisseria meningitidis NOT DETECTED NOT DETECTED Final   Pseudomonas aeruginosa NOT DETECTED NOT DETECTED Final   Stenotrophomonas maltophilia NOT DETECTED NOT DETECTED Final   Candida albicans NOT DETECTED NOT DETECTED Final   Candida auris NOT DETECTED NOT DETECTED Final   Candida glabrata NOT DETECTED NOT DETECTED Final   Candida krusei NOT DETECTED NOT DETECTED Final   Candida parapsilosis NOT DETECTED NOT DETECTED Final   Candida tropicalis NOT DETECTED NOT DETECTED Final   Cryptococcus neoformans/gattii NOT DETECTED NOT  DETECTED Final   Meth resistant mecA/C and MREJ NOT DETECTED NOT DETECTED Final    Comment: Performed at [facility], [address] [place], [place] [address]  Respiratory (~20 pathogens) panel by PCR     Status: None   Collection Time: 06/05/21  6:00 PM   Specimen: Nasopharyngeal Swab; Respiratory  Result Value Ref Range Status   Adenovirus NOT DETECTED NOT DETECTED Final   Coronavirus 229E NOT DETECTED NOT DETECTED Final    Comment: (NOTE) The Coronavirus on the Respiratory Panel, DOES NOT test for the novel  Coronavirus (2019 nCoV)    Coronavirus HKU1 NOT DETECTED NOT DETECTED Final   Coronavirus NL63 NOT DETECTED NOT DETECTED Final   Coronavirus OC43 NOT DETECTED NOT DETECTED Final   Metapneumovirus NOT DETECTED NOT DETECTED Final   Rhinovirus / Enterovirus NOT DETECTED NOT DETECTED Final   Influenza A NOT DETECTED NOT DETECTED Final   Influenza B NOT DETECTED NOT DETECTED Final   Parainfluenza Virus 1 NOT DETECTED NOT DETECTED Final   Parainfluenza Virus 2 NOT DETECTED NOT DETECTED Final   Parainfluenza Virus 3 NOT DETECTED NOT DETECTED Final   Parainfluenza Virus 4 NOT DETECTED NOT DETECTED Final   Respiratory Syncytial Virus NOT DETECTED NOT DETECTED Final   Bordetella pertussis NOT DETECTED NOT DETECTED Final   Bordetella Parapertussis NOT DETECTED NOT DETECTED Final   Chlamydophila pneumoniae NOT DETECTED NOT DETECTED Final   Mycoplasma pneumoniae NOT DETECTED NOT DETECTED Final    Comment: Performed at [facility], [address] [place], [place] [address]  Culture, blood (routine x 2)     Status: None   Collection Time: 06/06/21 10:30 AM   Specimen: BLOOD RIGHT WRIST  Result Value Ref Range Status   Specimen Description BLOOD RIGHT WRIST  Final   Special Requests   Final    BOTTLES DRAWN AEROBIC AND ANAEROBIC Blood Culture adequate volume   Culture   Final    NO GROWTH 5 DAYS Performed at [facility], [address], [place], [place]  [address]    Report Status 06/11/2021 FINAL  Final  Culture, blood (routine x 2)     Status: None   Collection Time: 06/06/21 10:45 AM   Specimen: BLOOD RIGHT HAND  Result Value Ref Range Status   Specimen Description BLOOD RIGHT HAND  Final   Special Requests  Final    BOTTLES DRAWN AEROBIC AND ANAEROBIC Blood Culture results may not be optimal due to an excessive volume of blood received in culture bottles   Culture   Final    NO GROWTH 5 DAYS Performed at [place] [address] [place], [place] [address]    Report Status 06/11/2021 FINAL  Final     Labs: BNP (last 3 results) Recent Labs    05/31/21 1701  BNP 0,375.4*   Basic Metabolic Panel: Recent Labs  Lab 06/08/21 0105 06/09/21 0343 06/10/21 0451 06/13/21 0324 06/14/21 0206  NA 132* 132* 137 126* 128*  K 4.4 5.2* 4.1 4.0 3.7  CL 93* 95* 98 90* 93*  CO2 22 20* $Remo'25 23 26  'nNctX$ GLUCOSE 324* 135* 69* 177* 185*  BUN 108* 134* 86* 87* 72*  CREATININE 7.16* 8.88* 6.67* 6.72* 5.87*  CALCIUM 7.6* 7.4* 7.7* 6.7* 6.8*  MG 2.5* 2.7* 2.6*  --   --   PHOS 4.8*  --   --   --   --    Liver Function Tests: Recent Labs  Lab 06/09/21 0343  AST 30  ALT 28  ALKPHOS 68  BILITOT 0.8  PROT 5.9*  ALBUMIN 1.8*   No results for input(s): LIPASE, AMYLASE in the last 168 hours. No results for input(s): AMMONIA in the last 168 hours. CBC: Recent Labs  Lab 06/08/21 0105 06/09/21 0343 06/10/21 0451 06/13/21 2148  WBC 13.5* 10.7* 11.7* 9.9  HGB 7.1* 7.1* 8.0* 9.0*  HCT 23.3* 22.9* 25.4* 27.6*  MCV 92.8 93.1 91.4 89.9  PLT 236 204 219 221   Cardiac Enzymes: No results for input(s): CKTOTAL, CKMB, CKMBINDEX, TROPONINI in the last 168 hours. BNP: Invalid input(s): POCBNP CBG: Recent Labs  Lab 06/14/21 0002 06/14/21 0400 06/14/21 0739 06/14/21 0826 06/14/21 1124  GLUCAP 337* 167* 60* 87 150*   D-Dimer No results for input(s): DDIMER in the last 72 hours. Hgb A1c No results for input(s): HGBA1C in the last 72  hours. Lipid Profile No results for input(s): CHOL, HDL, LDLCALC, TRIG, CHOLHDL, LDLDIRECT in the last 72 hours. Thyroid function studies No results for input(s): TSH, T4TOTAL, T3FREE, THYROIDAB in the last 72 hours.  Invalid input(s): FREET3 Anemia work up No results for input(s): VITAMINB12, FOLATE, FERRITIN, TIBC, IRON, RETICCTPCT in the last 72 hours. Urinalysis    Component Value Date/Time   COLORURINE RED (A) 01/06/2019 1118   APPEARANCEUR TURBID (A) 01/06/2019 1118   LABSPEC  01/06/2019 1118    TEST NOT REPORTED DUE TO COLOR INTERFERENCE OF URINE PIGMENT   PHURINE  01/06/2019 1118    TEST NOT REPORTED DUE TO COLOR INTERFERENCE OF URINE PIGMENT   GLUCOSEU (A) 01/06/2019 1118    TEST NOT REPORTED DUE TO COLOR INTERFERENCE OF URINE PIGMENT   HGBUR (A) 01/06/2019 1118    TEST NOT REPORTED DUE TO COLOR INTERFERENCE OF URINE PIGMENT   BILIRUBINUR (A) 01/06/2019 1118    TEST NOT REPORTED DUE TO COLOR INTERFERENCE OF URINE PIGMENT   KETONESUR (A) 01/06/2019 1118    TEST NOT REPORTED DUE TO COLOR INTERFERENCE OF URINE PIGMENT   PROTEINUR (A) 01/06/2019 1118    TEST NOT REPORTED DUE TO COLOR INTERFERENCE OF URINE PIGMENT   NITRITE (A) 01/06/2019 1118    TEST NOT REPORTED DUE TO COLOR INTERFERENCE OF URINE PIGMENT   LEUKOCYTESUR (A) 01/06/2019 1118    TEST NOT REPORTED DUE TO COLOR INTERFERENCE OF URINE PIGMENT   Sepsis Labs Invalid input(s): PROCALCITONIN,  WBC,  LACTICIDVEN Microbiology Recent Results (from the past 240 hour(s))  Culture, blood (routine x 2)     Status: Abnormal   Collection Time: 06/05/21  1:11 AM   Specimen: BLOOD  Result Value Ref Range Status   Specimen Description BLOOD RIGHT ANTECUBITAL  Final   Special Requests   Final    BOTTLES DRAWN AEROBIC ONLY Blood Culture adequate volume   Culture  Setup Time   Final    GRAM POSITIVE COCCI IN CLUSTERS AEROBIC BOTTLE ONLY CRITICAL RESULT CALLED TO, READ BACK BY AND VERIFIED WITH: Sarajane Marek [id number] AT 1442  BY CM Performed at [facility], [place] [address], [place], [place] [address]    Culture STAPHYLOCOCCUS AUREUS (A)  Final   Report Status 06/07/2021 FINAL  Final   Organism ID, Bacteria STAPHYLOCOCCUS AUREUS  Final      Susceptibility   Staphylococcus aureus - MIC*    CIPROFLOXACIN <=0.5 SENSITIVE Sensitive     ERYTHROMYCIN RESISTANT Resistant     GENTAMICIN <=0.5 SENSITIVE Sensitive     OXACILLIN 0.5 SENSITIVE Sensitive     TETRACYCLINE <=1 SENSITIVE Sensitive     VANCOMYCIN 1 SENSITIVE Sensitive     TRIMETH/SULFA <=10 SENSITIVE Sensitive     CLINDAMYCIN RESISTANT Resistant     RIFAMPIN <=0.5 SENSITIVE Sensitive     Inducible Clindamycin POSITIVE Resistant     * STAPHYLOCOCCUS AUREUS  Culture, blood (routine x 2)     Status: Abnormal   Collection Time: 06/05/21  1:11 AM   Specimen: BLOOD RIGHT HAND  Result Value Ref Range Status   Specimen Description BLOOD RIGHT HAND  Final   Special Requests   Final    BOTTLES DRAWN AEROBIC ONLY Blood Culture adequate volume   Culture  Setup Time   Final    GRAM POSITIVE COCCI IN CLUSTERS AEROBIC BOTTLE ONLY    Culture (A)  Final    STAPHYLOCOCCUS AUREUS SUSCEPTIBILITIES PERFORMED ON PREVIOUS CULTURE WITHIN THE LAST 5 DAYS. Performed at [facility], [place] [address] [place],  [address]    Report Status 06/07/2021 FINAL  Final  Blood Culture ID Panel (Reflexed)     Status: Abnormal   Collection Time: 06/05/21  1:11 AM  Result Value Ref Range Status   Enterococcus faecalis NOT DETECTED NOT DETECTED Final   Enterococcus Faecium NOT DETECTED NOT DETECTED Final   Listeria monocytogenes NOT DETECTED NOT DETECTED Final   Staphylococcus species DETECTED (A) NOT DETECTED Final    Comment: CRITICAL RESULT CALLED TO, READ BACK BY AND VERIFIED WITH: PHARMD J FRENS [id number] AT 1441 BY CM    Staphylococcus aureus (BCID) DETECTED (A) NOT DETECTED Final    Comment: CRITICAL RESULT CALLED TO, READ BACK BY AND VERIFIED WITH: PHARMD J  FRENS [id number] AT 1441 BY CM    Staphylococcus epidermidis NOT DETECTED NOT DETECTED Final   Staphylococcus lugdunensis NOT DETECTED NOT DETECTED Final   Streptococcus species NOT DETECTED NOT DETECTED Final   Streptococcus agalactiae NOT DETECTED NOT DETECTED Final   Streptococcus pneumoniae NOT DETECTED NOT DETECTED Final   Streptococcus pyogenes NOT DETECTED NOT DETECTED Final   A.calcoaceticus-baumannii NOT DETECTED NOT DETECTED Final   Bacteroides fragilis NOT DETECTED NOT DETECTED Final   Enterobacterales NOT DETECTED NOT DETECTED Final   Enterobacter cloacae complex NOT DETECTED NOT DETECTED Final   Escherichia coli NOT DETECTED NOT DETECTED Final   Klebsiella aerogenes NOT DETECTED NOT DETECTED Final   Klebsiella oxytoca NOT DETECTED NOT DETECTED Final  Klebsiella pneumoniae NOT DETECTED NOT DETECTED Final   Proteus species NOT DETECTED NOT DETECTED Final   Salmonella species NOT DETECTED NOT DETECTED Final   Serratia marcescens NOT DETECTED NOT DETECTED Final   Haemophilus influenzae NOT DETECTED NOT DETECTED Final   Neisseria meningitidis NOT DETECTED NOT DETECTED Final   Pseudomonas aeruginosa NOT DETECTED NOT DETECTED Final   Stenotrophomonas maltophilia NOT DETECTED NOT DETECTED Final   Candida albicans NOT DETECTED NOT DETECTED Final   Candida auris NOT DETECTED NOT DETECTED Final   Candida glabrata NOT DETECTED NOT DETECTED Final   Candida krusei NOT DETECTED NOT DETECTED Final   Candida parapsilosis NOT DETECTED NOT DETECTED Final   Candida tropicalis NOT DETECTED NOT DETECTED Final   Cryptococcus neoformans/gattii NOT DETECTED NOT DETECTED Final   Meth resistant mecA/C and MREJ NOT DETECTED NOT DETECTED Final    Comment: Performed at [facility], [place] [address] [place], [place] [address]  Respiratory (~20 pathogens) panel by PCR     Status: None   Collection Time: 06/05/21  6:00 PM   Specimen: Nasopharyngeal Swab; Respiratory  Result Value Ref Range Status    Adenovirus NOT DETECTED NOT DETECTED Final   Coronavirus 229E NOT DETECTED NOT DETECTED Final    Comment: (NOTE) The Coronavirus on the Respiratory Panel, DOES NOT test for the novel  Coronavirus (2019 nCoV)    Coronavirus HKU1 NOT DETECTED NOT DETECTED Final   Coronavirus NL63 NOT DETECTED NOT DETECTED Final   Coronavirus OC43 NOT DETECTED NOT DETECTED Final   Metapneumovirus NOT DETECTED NOT DETECTED Final   Rhinovirus / Enterovirus NOT DETECTED NOT DETECTED Final   Influenza A NOT DETECTED NOT DETECTED Final   Influenza B NOT DETECTED NOT DETECTED Final   Parainfluenza Virus 1 NOT DETECTED NOT DETECTED Final   Parainfluenza Virus 2 NOT DETECTED NOT DETECTED Final   Parainfluenza Virus 3 NOT DETECTED NOT DETECTED Final   Parainfluenza Virus 4 NOT DETECTED NOT DETECTED Final   Respiratory Syncytial Virus NOT DETECTED NOT DETECTED Final   Bordetella pertussis NOT DETECTED NOT DETECTED Final   Bordetella Parapertussis NOT DETECTED NOT DETECTED Final   Chlamydophila pneumoniae NOT DETECTED NOT DETECTED Final   Mycoplasma pneumoniae NOT DETECTED NOT DETECTED Final    Comment: Performed at [facility], [place] [address], [place], [place] [address]  Culture, blood (routine x 2)     Status: None   Collection Time: 06/06/21 10:30 AM   Specimen: BLOOD RIGHT WRIST  Result Value Ref Range Status   Specimen Description BLOOD RIGHT WRIST  Final   Special Requests   Final    BOTTLES DRAWN AEROBIC AND ANAEROBIC Blood Culture adequate volume   Culture   Final    NO GROWTH 5 DAYS Performed at [facility], [place] [address], [place], [place] [address]    Report Status 06/11/2021 FINAL  Final  Culture, blood (routine x 2)     Status: None   Collection Time: 06/06/21 10:45 AM   Specimen: BLOOD RIGHT HAND  Result Value Ref Range Status   Specimen Description BLOOD RIGHT HAND  Final   Special Requests   Final    BOTTLES DRAWN AEROBIC AND ANAEROBIC Blood Culture results may not be  optimal due to an excessive volume of blood received in culture bottles   Culture   Final    NO GROWTH 5 DAYS Performed at [facility], [place] [address], [place], [place] [address]    Report Status  06/11/2021 FINAL  Final     Time coordinating discharge: 45 minutes  SIGNED:   Tawni Millers, MD  [facility] 06/14/2021, 11:53 AM

## 2021-06-15 ENCOUNTER — Emergency Department (HOSPITAL_COMMUNITY): Payer: Medicare Other

## 2021-06-15 ENCOUNTER — Encounter (HOSPITAL_COMMUNITY): Payer: Self-pay

## 2021-06-15 ENCOUNTER — Inpatient Hospital Stay (HOSPITAL_COMMUNITY)
Admission: EM | Admit: 2021-06-15 | Discharge: 2021-07-05 | DRG: 280 | Disposition: A | Discharge status: Hospice | Payer: Medicare Other | Attending: Internal Medicine | Admitting: Internal Medicine

## 2021-06-15 ENCOUNTER — Other Ambulatory Visit: Payer: Self-pay

## 2021-06-15 DIAGNOSIS — N186 End stage renal disease: Secondary | ICD-10-CM | POA: Diagnosis present

## 2021-06-15 DIAGNOSIS — D508 Other iron deficiency anemias: Secondary | ICD-10-CM | POA: Diagnosis not present

## 2021-06-15 DIAGNOSIS — Z823 Family history of stroke: Secondary | ICD-10-CM

## 2021-06-15 DIAGNOSIS — E440 Moderate protein-calorie malnutrition: Secondary | ICD-10-CM | POA: Diagnosis present

## 2021-06-15 DIAGNOSIS — C7951 Secondary malignant neoplasm of bone: Secondary | ICD-10-CM | POA: Diagnosis present

## 2021-06-15 DIAGNOSIS — I959 Hypotension, unspecified: Secondary | ICD-10-CM | POA: Diagnosis present

## 2021-06-15 DIAGNOSIS — D500 Iron deficiency anemia secondary to blood loss (chronic): Secondary | ICD-10-CM | POA: Diagnosis present

## 2021-06-15 DIAGNOSIS — E871 Hypo-osmolality and hyponatremia: Secondary | ICD-10-CM | POA: Diagnosis present

## 2021-06-15 DIAGNOSIS — J984 Other disorders of lung: Secondary | ICD-10-CM

## 2021-06-15 DIAGNOSIS — Z79890 Hormone replacement therapy (postmenopausal): Secondary | ICD-10-CM

## 2021-06-15 DIAGNOSIS — R0600 Dyspnea, unspecified: Secondary | ICD-10-CM | POA: Diagnosis not present

## 2021-06-15 DIAGNOSIS — I5033 Acute on chronic diastolic (congestive) heart failure: Secondary | ICD-10-CM | POA: Diagnosis present

## 2021-06-15 DIAGNOSIS — Z992 Dependence on renal dialysis: Secondary | ICD-10-CM

## 2021-06-15 DIAGNOSIS — J159 Unspecified bacterial pneumonia: Secondary | ICD-10-CM | POA: Diagnosis present

## 2021-06-15 DIAGNOSIS — J1282 Pneumonia due to coronavirus disease 2019: Secondary | ICD-10-CM

## 2021-06-15 DIAGNOSIS — D630 Anemia in neoplastic disease: Secondary | ICD-10-CM | POA: Diagnosis present

## 2021-06-15 DIAGNOSIS — D509 Iron deficiency anemia, unspecified: Secondary | ICD-10-CM | POA: Diagnosis present

## 2021-06-15 DIAGNOSIS — E669 Obesity, unspecified: Secondary | ICD-10-CM | POA: Diagnosis present

## 2021-06-15 DIAGNOSIS — I132 Hypertensive heart and chronic kidney disease with heart failure and with stage 5 chronic kidney disease, or end stage renal disease: Principal | ICD-10-CM | POA: Diagnosis present

## 2021-06-15 DIAGNOSIS — I214 Non-ST elevation (NSTEMI) myocardial infarction: Secondary | ICD-10-CM | POA: Diagnosis present

## 2021-06-15 DIAGNOSIS — I5032 Chronic diastolic (congestive) heart failure: Secondary | ICD-10-CM | POA: Diagnosis not present

## 2021-06-15 DIAGNOSIS — K5900 Constipation, unspecified: Secondary | ICD-10-CM | POA: Diagnosis present

## 2021-06-15 DIAGNOSIS — K922 Gastrointestinal hemorrhage, unspecified: Secondary | ICD-10-CM | POA: Diagnosis present

## 2021-06-15 DIAGNOSIS — Z8719 Personal history of other diseases of the digestive system: Secondary | ICD-10-CM

## 2021-06-15 DIAGNOSIS — E1122 Type 2 diabetes mellitus with diabetic chronic kidney disease: Secondary | ICD-10-CM | POA: Diagnosis present

## 2021-06-15 DIAGNOSIS — E8770 Fluid overload, unspecified: Secondary | ICD-10-CM | POA: Diagnosis not present

## 2021-06-15 DIAGNOSIS — G4733 Obstructive sleep apnea (adult) (pediatric): Secondary | ICD-10-CM | POA: Diagnosis present

## 2021-06-15 DIAGNOSIS — Z8673 Personal history of transient ischemic attack (TIA), and cerebral infarction without residual deficits: Secondary | ICD-10-CM

## 2021-06-15 DIAGNOSIS — D696 Thrombocytopenia, unspecified: Secondary | ICD-10-CM | POA: Diagnosis present

## 2021-06-15 DIAGNOSIS — E875 Hyperkalemia: Secondary | ICD-10-CM | POA: Diagnosis present

## 2021-06-15 DIAGNOSIS — Z66 Do not resuscitate: Secondary | ICD-10-CM | POA: Diagnosis not present

## 2021-06-15 DIAGNOSIS — J9622 Acute and chronic respiratory failure with hypercapnia: Secondary | ICD-10-CM | POA: Diagnosis present

## 2021-06-15 DIAGNOSIS — R195 Other fecal abnormalities: Secondary | ICD-10-CM | POA: Diagnosis present

## 2021-06-15 DIAGNOSIS — D689 Coagulation defect, unspecified: Secondary | ICD-10-CM | POA: Diagnosis present

## 2021-06-15 DIAGNOSIS — I4892 Unspecified atrial flutter: Secondary | ICD-10-CM | POA: Diagnosis present

## 2021-06-15 DIAGNOSIS — R778 Other specified abnormalities of plasma proteins: Secondary | ICD-10-CM | POA: Diagnosis not present

## 2021-06-15 DIAGNOSIS — I480 Paroxysmal atrial fibrillation: Secondary | ICD-10-CM | POA: Diagnosis present

## 2021-06-15 DIAGNOSIS — E785 Hyperlipidemia, unspecified: Secondary | ICD-10-CM | POA: Diagnosis present

## 2021-06-15 DIAGNOSIS — Z6836 Body mass index (BMI) 36.0-36.9, adult: Secondary | ICD-10-CM | POA: Diagnosis not present

## 2021-06-15 DIAGNOSIS — R7881 Bacteremia: Secondary | ICD-10-CM | POA: Diagnosis present

## 2021-06-15 DIAGNOSIS — R1310 Dysphagia, unspecified: Secondary | ICD-10-CM | POA: Diagnosis present

## 2021-06-15 DIAGNOSIS — I2690 Septic pulmonary embolism without acute cor pulmonale: Secondary | ICD-10-CM | POA: Diagnosis present

## 2021-06-15 DIAGNOSIS — I10 Essential (primary) hypertension: Secondary | ICD-10-CM

## 2021-06-15 DIAGNOSIS — D631 Anemia in chronic kidney disease: Secondary | ICD-10-CM | POA: Diagnosis present

## 2021-06-15 DIAGNOSIS — I482 Chronic atrial fibrillation, unspecified: Secondary | ICD-10-CM | POA: Diagnosis present

## 2021-06-15 DIAGNOSIS — R54 Age-related physical debility: Secondary | ICD-10-CM | POA: Diagnosis present

## 2021-06-15 DIAGNOSIS — J189 Pneumonia, unspecified organism: Secondary | ICD-10-CM

## 2021-06-15 DIAGNOSIS — Z888 Allergy status to other drugs, medicaments and biological substances status: Secondary | ICD-10-CM

## 2021-06-15 DIAGNOSIS — J9621 Acute and chronic respiratory failure with hypoxia: Secondary | ICD-10-CM | POA: Diagnosis present

## 2021-06-15 DIAGNOSIS — M1990 Unspecified osteoarthritis, unspecified site: Secondary | ICD-10-CM | POA: Diagnosis present

## 2021-06-15 DIAGNOSIS — R0602 Shortness of breath: Secondary | ICD-10-CM | POA: Diagnosis not present

## 2021-06-15 DIAGNOSIS — Z9079 Acquired absence of other genital organ(s): Secondary | ICD-10-CM

## 2021-06-15 DIAGNOSIS — R6510 Systemic inflammatory response syndrome (SIRS) of non-infectious origin without acute organ dysfunction: Secondary | ICD-10-CM

## 2021-06-15 DIAGNOSIS — Z7189 Other specified counseling: Secondary | ICD-10-CM | POA: Diagnosis not present

## 2021-06-15 DIAGNOSIS — Z833 Family history of diabetes mellitus: Secondary | ICD-10-CM

## 2021-06-15 DIAGNOSIS — J9811 Atelectasis: Secondary | ICD-10-CM | POA: Diagnosis present

## 2021-06-15 DIAGNOSIS — Z8701 Personal history of pneumonia (recurrent): Secondary | ICD-10-CM

## 2021-06-15 DIAGNOSIS — C61 Malignant neoplasm of prostate: Secondary | ICD-10-CM | POA: Diagnosis present

## 2021-06-15 DIAGNOSIS — R7989 Other specified abnormal findings of blood chemistry: Secondary | ICD-10-CM | POA: Diagnosis not present

## 2021-06-15 DIAGNOSIS — B9561 Methicillin susceptible Staphylococcus aureus infection as the cause of diseases classified elsewhere: Secondary | ICD-10-CM

## 2021-06-15 DIAGNOSIS — L8990 Pressure ulcer of unspecified site, unspecified stage: Secondary | ICD-10-CM | POA: Diagnosis present

## 2021-06-15 DIAGNOSIS — Z8616 Personal history of COVID-19: Secondary | ICD-10-CM

## 2021-06-15 DIAGNOSIS — Z8249 Family history of ischemic heart disease and other diseases of the circulatory system: Secondary | ICD-10-CM

## 2021-06-15 DIAGNOSIS — S82899A Other fracture of unspecified lower leg, initial encounter for closed fracture: Secondary | ICD-10-CM | POA: Diagnosis present

## 2021-06-15 DIAGNOSIS — J9612 Chronic respiratory failure with hypercapnia: Secondary | ICD-10-CM | POA: Diagnosis present

## 2021-06-15 DIAGNOSIS — J9611 Chronic respiratory failure with hypoxia: Secondary | ICD-10-CM | POA: Diagnosis present

## 2021-06-15 DIAGNOSIS — Z79899 Other long term (current) drug therapy: Secondary | ICD-10-CM

## 2021-06-15 DIAGNOSIS — Z7901 Long term (current) use of anticoagulants: Secondary | ICD-10-CM

## 2021-06-15 DIAGNOSIS — L89152 Pressure ulcer of sacral region, stage 2: Secondary | ICD-10-CM | POA: Diagnosis present

## 2021-06-15 DIAGNOSIS — E039 Hypothyroidism, unspecified: Secondary | ICD-10-CM | POA: Diagnosis present

## 2021-06-15 DIAGNOSIS — Z9989 Dependence on other enabling machines and devices: Secondary | ICD-10-CM

## 2021-06-15 DIAGNOSIS — D649 Anemia, unspecified: Secondary | ICD-10-CM | POA: Diagnosis not present

## 2021-06-15 DIAGNOSIS — Z8546 Personal history of malignant neoplasm of prostate: Secondary | ICD-10-CM

## 2021-06-15 DIAGNOSIS — Z8619 Personal history of other infectious and parasitic diseases: Secondary | ICD-10-CM

## 2021-06-15 DIAGNOSIS — I340 Nonrheumatic mitral (valve) insufficiency: Secondary | ICD-10-CM | POA: Diagnosis present

## 2021-06-15 DIAGNOSIS — Z794 Long term (current) use of insulin: Secondary | ICD-10-CM

## 2021-06-15 DIAGNOSIS — Z515 Encounter for palliative care: Secondary | ICD-10-CM | POA: Diagnosis not present

## 2021-06-15 DIAGNOSIS — Z87891 Personal history of nicotine dependence: Secondary | ICD-10-CM

## 2021-06-15 DIAGNOSIS — E889 Metabolic disorder, unspecified: Secondary | ICD-10-CM | POA: Diagnosis present

## 2021-06-15 DIAGNOSIS — E1142 Type 2 diabetes mellitus with diabetic polyneuropathy: Secondary | ICD-10-CM | POA: Diagnosis present

## 2021-06-15 DIAGNOSIS — U071 COVID-19: Secondary | ICD-10-CM

## 2021-06-15 LAB — BLOOD GAS, ARTERIAL
Acid-Base Excess: 0.5 mmol/L (ref 0.0–2.0)
Bicarbonate: 24.5 mmol/L (ref 20.0–28.0)
Drawn by: 164
FIO2: 36
O2 Saturation: 95.6 %
Patient temperature: 37
pCO2 arterial: 38.7 mmHg (ref 32.0–48.0)
pH, Arterial: 7.418 (ref 7.350–7.450)
pO2, Arterial: 81.2 mmHg — ABNORMAL LOW (ref 83.0–108.0)

## 2021-06-15 LAB — I-STAT VENOUS BLOOD GAS, ED
Acid-base deficit: 2 mmol/L (ref 0.0–2.0)
Bicarbonate: 22.4 mmol/L (ref 20.0–28.0)
Calcium, Ion: 0.82 mmol/L — CL (ref 1.15–1.40)
HCT: 24 % — ABNORMAL LOW (ref 39.0–52.0)
Hemoglobin: 8.2 g/dL — ABNORMAL LOW (ref 13.0–17.0)
O2 Saturation: 82 %
Potassium: 5.8 mmol/L — ABNORMAL HIGH (ref 3.5–5.1)
Sodium: 124 mmol/L — ABNORMAL LOW (ref 135–145)
TCO2: 23 mmol/L (ref 22–32)
pCO2, Ven: 33.8 mmHg — ABNORMAL LOW (ref 44.0–60.0)
pH, Ven: 7.429 (ref 7.250–7.430)
pO2, Ven: 45 mmHg (ref 32.0–45.0)

## 2021-06-15 LAB — COMPREHENSIVE METABOLIC PANEL
ALT: 5 U/L (ref 0–44)
AST: 27 U/L (ref 15–41)
Albumin: 2 g/dL — ABNORMAL LOW (ref 3.5–5.0)
Alkaline Phosphatase: 89 U/L (ref 38–126)
Anion gap: 18 — ABNORMAL HIGH (ref 5–15)
BUN: 96 mg/dL — ABNORMAL HIGH (ref 8–23)
CO2: 20 mmol/L — ABNORMAL LOW (ref 22–32)
Calcium: 7.3 mg/dL — ABNORMAL LOW (ref 8.9–10.3)
Chloride: 87 mmol/L — ABNORMAL LOW (ref 98–111)
Creatinine, Ser: 7.21 mg/dL — ABNORMAL HIGH (ref 0.61–1.24)
GFR, Estimated: 8 mL/min — ABNORMAL LOW (ref >60)
Glucose, Bld: 208 mg/dL — ABNORMAL HIGH (ref 70–99)
Potassium: 5.6 mmol/L — ABNORMAL HIGH (ref 3.5–5.1)
Sodium: 125 mmol/L — ABNORMAL LOW (ref 135–145)
Total Bilirubin: 0.8 mg/dL (ref 0.3–1.2)
Total Protein: 6.7 g/dL (ref 6.5–8.1)

## 2021-06-15 LAB — CBC WITH DIFFERENTIAL/PLATELET
Abs Immature Granulocytes: 0.11 10*3/uL — ABNORMAL HIGH (ref 0.00–0.07)
Basophils Absolute: 0 10*3/uL (ref 0.0–0.1)
Basophils Relative: 0 %
Eosinophils Absolute: 0 10*3/uL (ref 0.0–0.5)
Eosinophils Relative: 0 %
HCT: 28.1 % — ABNORMAL LOW (ref 39.0–52.0)
Hemoglobin: 8.6 g/dL — ABNORMAL LOW (ref 13.0–17.0)
Immature Granulocytes: 1 %
Lymphocytes Relative: 2 %
Lymphs Abs: 0.3 10*3/uL — ABNORMAL LOW (ref 0.7–4.0)
MCH: 28 pg (ref 26.0–34.0)
MCHC: 30.6 g/dL (ref 30.0–36.0)
MCV: 91.5 fL (ref 80.0–100.0)
Monocytes Absolute: 0.7 10*3/uL (ref 0.1–1.0)
Monocytes Relative: 4 %
Neutro Abs: 15.6 10*3/uL — ABNORMAL HIGH (ref 1.7–7.7)
Neutrophils Relative %: 93 %
Platelets: 218 10*3/uL (ref 150–400)
RBC: 3.07 MIL/uL — ABNORMAL LOW (ref 4.22–5.81)
RDW: 17.5 % — ABNORMAL HIGH (ref 11.5–15.5)
WBC: 16.7 10*3/uL — ABNORMAL HIGH (ref 4.0–10.5)
nRBC: 0 % (ref 0.0–0.2)

## 2021-06-15 LAB — TROPONIN I (HIGH SENSITIVITY)
Troponin I (High Sensitivity): 1178 ng/L (ref <18)
Troponin I (High Sensitivity): 1119 ng/L (ref <18)

## 2021-06-15 LAB — BRAIN NATRIURETIC PEPTIDE: B Natriuretic Peptide: 1728 pg/mL — ABNORMAL HIGH (ref 0.0–100.0)

## 2021-06-15 LAB — LACTIC ACID, PLASMA: Lactic Acid, Venous: 1.2 mmol/L (ref 0.5–1.9)

## 2021-06-15 LAB — MRSA NEXT GEN BY PCR, NASAL: MRSA by PCR Next Gen: NOT DETECTED

## 2021-06-15 LAB — GLUCOSE, CAPILLARY: Glucose-Capillary: 156 mg/dL — ABNORMAL HIGH (ref 70–99)

## 2021-06-15 MED ORDER — ACETAMINOPHEN 650 MG RE SUPP: 650.0000 mg | Freq: Four times a day (QID) | RECTAL | Status: DC | PRN

## 2021-06-15 MED ORDER — SODIUM CHLORIDE 0.9 % IV SOLN: 1.0000 g | INTRAVENOUS | Status: DC

## 2021-06-15 MED ORDER — FUROSEMIDE 10 MG/ML IJ SOLN
80.0000 mg | Freq: Once | INTRAMUSCULAR | Status: AC
  Administered 2021-06-15: 80 mg via INTRAVENOUS
  Filled 2021-06-15: qty 8

## 2021-06-15 MED ORDER — ACETAMINOPHEN 325 MG PO TABS
650.0000 mg | ORAL_TABLET | Freq: Four times a day (QID) | ORAL | Status: DC | PRN
  Administered 2021-06-17 – 2021-06-20 (×5): 650 mg via ORAL
  Filled 2021-06-15 (×5): qty 2

## 2021-06-15 MED ORDER — ENZALUTAMIDE 40 MG PO CAPS: 160.0000 mg | ORAL_CAPSULE | Freq: Every day | ORAL | Status: DC

## 2021-06-15 MED ORDER — AMITRIPTYLINE HCL 50 MG PO TABS
100.0000 mg | ORAL_TABLET | Freq: Every day | ORAL | Status: DC
  Administered 2021-06-15 – 2021-07-04 (×20): 100 mg via ORAL
  Filled 2021-06-15: qty 4
  Filled 2021-06-15: qty 2
  Filled 2021-06-15 (×2): qty 4
  Filled 2021-06-15 (×2): qty 2
  Filled 2021-06-15 (×3): qty 4
  Filled 2021-06-15: qty 2
  Filled 2021-06-15: qty 4
  Filled 2021-06-15 (×2): qty 2
  Filled 2021-06-15 (×3): qty 4
  Filled 2021-06-15: qty 2
  Filled 2021-06-15 (×4): qty 4

## 2021-06-15 MED ORDER — LIDOCAINE-PRILOCAINE 2.5-2.5 % EX CREA
1.0000 "application " | TOPICAL_CREAM | CUTANEOUS | Status: DC
  Filled 2021-06-15: qty 5

## 2021-06-15 MED ORDER — CEFAZOLIN IV (FOR PTA / DISCHARGE USE ONLY): 2.0000 g | INTRAVENOUS | Status: DC

## 2021-06-15 MED ORDER — SERTRALINE HCL 100 MG PO TABS
100.0000 mg | ORAL_TABLET | Freq: Every day | ORAL | Status: DC
  Administered 2021-06-15 – 2021-07-02 (×18): 100 mg via ORAL
  Filled 2021-06-15 (×18): qty 1

## 2021-06-15 MED ORDER — DIPHENOXYLATE-ATROPINE 2.5-0.025 MG PO TABS: 1.0000 | ORAL_TABLET | Freq: Four times a day (QID) | ORAL | Status: DC | PRN

## 2021-06-15 MED ORDER — ALBUTEROL SULFATE (2.5 MG/3ML) 0.083% IN NEBU
2.5000 mg | INHALATION_SOLUTION | RESPIRATORY_TRACT | Status: DC | PRN
  Administered 2021-06-17 – 2021-07-05 (×6): 2.5 mg via RESPIRATORY_TRACT
  Filled 2021-06-15 (×7): qty 3

## 2021-06-15 MED ORDER — INSULIN GLARGINE-YFGN 100 UNIT/ML ~~LOC~~ SOLN
10.0000 [IU] | Freq: Every day | SUBCUTANEOUS | Status: DC
  Administered 2021-06-15 – 2021-07-01 (×17): 10 [IU] via SUBCUTANEOUS
  Filled 2021-06-15 (×20): qty 0.1

## 2021-06-15 MED ORDER — SODIUM ZIRCONIUM CYCLOSILICATE 10 G PO PACK
10.0000 g | PACK | Freq: Once | ORAL | Status: AC
  Administered 2021-06-15: 10 g via ORAL
  Filled 2021-06-15: qty 1

## 2021-06-15 MED ORDER — LEVOTHYROXINE SODIUM 100 MCG PO TABS
100.0000 ug | ORAL_TABLET | Freq: Every day | ORAL | Status: DC
  Administered 2021-06-16 – 2021-07-02 (×17): 100 ug via ORAL
  Filled 2021-06-15 (×18): qty 1

## 2021-06-15 MED ORDER — CHLORHEXIDINE GLUCONATE 0.12 % MT SOLN
15.0000 mL | Freq: Two times a day (BID) | OROMUCOSAL | Status: DC
  Administered 2021-06-15 – 2021-07-02 (×21): 15 mL via OROMUCOSAL
  Filled 2021-06-15 (×31): qty 15

## 2021-06-15 MED ORDER — CEFAZOLIN SODIUM-DEXTROSE 2-4 GM/100ML-% IV SOLN
2.0000 g | Freq: Once | INTRAVENOUS | Status: AC
  Administered 2021-06-15: 2 g via INTRAVENOUS
  Filled 2021-06-15 (×2): qty 100

## 2021-06-15 MED ORDER — VANCOMYCIN HCL 2000 MG/400ML IV SOLN
2000.0000 mg | Freq: Once | INTRAVENOUS | Status: DC
  Administered 2021-06-15: 2000 mg via INTRAVENOUS
  Filled 2021-06-15 (×2): qty 400

## 2021-06-15 MED ORDER — ATORVASTATIN CALCIUM 80 MG PO TABS
80.0000 mg | ORAL_TABLET | Freq: Every evening | ORAL | Status: DC
  Administered 2021-06-15 – 2021-07-01 (×17): 80 mg via ORAL
  Filled 2021-06-15 (×17): qty 1

## 2021-06-15 MED ORDER — CHLORHEXIDINE GLUCONATE CLOTH 2 % EX PADS
6.0000 | MEDICATED_PAD | Freq: Every day | CUTANEOUS | Status: DC
  Administered 2021-06-15 – 2021-07-02 (×14): 6 via TOPICAL

## 2021-06-15 MED ORDER — HEPARIN SODIUM (PORCINE) 1000 UNIT/ML IJ SOLN
INTRAMUSCULAR | Status: AC
  Administered 2021-06-15: 1000 [IU]
  Filled 2021-06-15: qty 4

## 2021-06-15 MED ORDER — CEFAZOLIN SODIUM-DEXTROSE 2-4 GM/100ML-% IV SOLN
2.0000 g | INTRAVENOUS | Status: DC
  Administered 2021-06-17 – 2021-07-01 (×7): 2 g via INTRAVENOUS
  Filled 2021-06-15 (×9): qty 100

## 2021-06-15 MED ORDER — ADULT MULTIVITAMIN W/MINERALS CH
1.0000 | ORAL_TABLET | Freq: Every day | ORAL | Status: DC
  Administered 2021-06-15 – 2021-07-02 (×18): 1 via ORAL
  Filled 2021-06-15 (×18): qty 1

## 2021-06-15 MED ORDER — IPRATROPIUM-ALBUTEROL 0.5-2.5 (3) MG/3ML IN SOLN
3.0000 mL | Freq: Once | RESPIRATORY_TRACT | Status: AC
  Administered 2021-06-15: 3 mL via RESPIRATORY_TRACT
  Filled 2021-06-15: qty 3

## 2021-06-15 MED ORDER — VANCOMYCIN VARIABLE DOSE PER UNSTABLE RENAL FUNCTION (PHARMACIST DOSING): Status: DC

## 2021-06-15 MED ORDER — INSULIN ASPART 100 UNIT/ML IJ SOLN
0.0000 [IU] | Freq: Three times a day (TID) | INTRAMUSCULAR | Status: DC
  Administered 2021-06-16 – 2021-06-18 (×7): 1 [IU] via SUBCUTANEOUS
  Administered 2021-06-22: 2 [IU] via SUBCUTANEOUS
  Administered 2021-06-22: 1 [IU] via SUBCUTANEOUS
  Administered 2021-06-23: 2 [IU] via SUBCUTANEOUS
  Administered 2021-06-23: 1 [IU] via SUBCUTANEOUS
  Administered 2021-06-23: 2 [IU] via SUBCUTANEOUS
  Administered 2021-06-25: 18:00:00 1 [IU] via SUBCUTANEOUS
  Administered 2021-06-25: 13:00:00 2 [IU] via SUBCUTANEOUS
  Administered 2021-06-25 – 2021-06-27 (×3): 1 [IU] via SUBCUTANEOUS
  Administered 2021-06-27: 0 [IU] via SUBCUTANEOUS
  Administered 2021-06-27: 1 [IU] via SUBCUTANEOUS
  Administered 2021-06-29 – 2021-06-30 (×3): 2 [IU] via SUBCUTANEOUS
  Administered 2021-06-30: 1 [IU] via SUBCUTANEOUS
  Administered 2021-06-30: 2 [IU] via SUBCUTANEOUS
  Administered 2021-07-01 (×2): 1 [IU] via SUBCUTANEOUS

## 2021-06-15 MED ORDER — ORAL CARE MOUTH RINSE
15.0000 mL | Freq: Two times a day (BID) | OROMUCOSAL | Status: DC
  Administered 2021-06-16 – 2021-07-02 (×19): 15 mL via OROMUCOSAL

## 2021-06-15 MED ORDER — SODIUM CHLORIDE 0.9 % IV SOLN
2.0000 g | Freq: Once | INTRAVENOUS | Status: AC
  Administered 2021-06-15: 2 g via INTRAVENOUS
  Filled 2021-06-15: qty 2

## 2021-06-15 MED ORDER — MIDODRINE HCL 5 MG PO TABS
10.0000 mg | ORAL_TABLET | Freq: Three times a day (TID) | ORAL | Status: DC
  Administered 2021-06-15 – 2021-06-16 (×2): 10 mg via ORAL
  Filled 2021-06-15 (×2): qty 2

## 2021-06-15 MED ORDER — APIXABAN 2.5 MG PO TABS
2.5000 mg | ORAL_TABLET | Freq: Two times a day (BID) | ORAL | Status: DC
  Administered 2021-06-15 – 2021-06-21 (×12): 2.5 mg via ORAL
  Filled 2021-06-15 (×12): qty 1

## 2021-06-15 MED ORDER — GUAIFENESIN ER 600 MG PO TB12: 600.0000 mg | ORAL_TABLET | Freq: Two times a day (BID) | ORAL | Status: DC | PRN

## 2021-06-15 MED ORDER — ALBUMIN HUMAN 25 % IV SOLN
INTRAVENOUS | Status: AC
  Administered 2021-06-15: 25 g
  Filled 2021-06-15: qty 100

## 2021-06-15 NOTE — ED Provider Notes (Signed)
[facility] EMERGENCY DEPARTMENT Provider Note   CSN: [id number] Arrival date & time: 06/15/21  0747     History Chief Complaint  Patient presents with   Shortness of Breath    Richard Hall is a 66 y.o. male with history of chronic respiratory failure on 2 L at baseline, end-stage renal disease on dialysis Monday Wednesday Friday, CHF, hypertension, and hyperlipidemia who presents to the emergency department with shortness of breath that acutely worsened last night.  Per EMS, patient was recently discharged from the hospital yesterday and was at [facility] secondary to COVID pneumonia and MSSA bacteremia.  Shortly after being checked and there nursing staff noticed that he was on 80% on his 2 L nasal cannula.  They pumped him up to 4 L which brought him up to 86% and EMS was then contacted earlier this morning.  He was placed on nonrebreather and given a nebulizer treatment and was at 98%.  Patient states he is feels about the same as he did yesterday when he was discharged.  Does complain of shortness of breath.  No chest pain, abdominal pain, nausea, vomiting.  Does mention he is having some diarrhea.  No fever or chills.  Has been taking his Lasix as prescribed and does not feel that he is retaining any water.  Shortness of Breath     Home Medications Prior to Admission medications   Medication Sig Start Date End Date Taking? Authorizing Provider  acetaminophen (TYLENOL) 500 MG tablet Take 500-1,000 mg by mouth every 6 (six) hours as needed (for pain.).    [provider]  amitriptyline (ELAVIL) 100 MG tablet Take 1 tablet (100 mg total) by mouth at bedtime. 05/25/19   Mitzi Hansen, MD  atorvastatin (LIPITOR) 80 MG tablet Take 80 mg by mouth daily. 03/08/20   [provider]  ceFAZolin (ANCEF) IVPB Inject 2 g into the vein every Monday, Wednesday, and Friday at 6 PM. Indication: MSSA bacteremia. Give cefazolin IV dose after each  hemodialysis session MWF. First Dose: No Last Day of Therapy:  07/17/2021 Labs - Once weekly:  CBC/D and BMP, Labs - Every other week:  ESR and CRP Method of administration: IV Push 06/17/21   Arrien, Jimmy Picket, MD  diphenoxylate-atropine (LOMOTIL) 2.5-0.025 MG tablet Take 1 tablet by mouth 4 (four) times daily as needed for diarrhea or loose stools. 04/23/21   [provider]  ELIQUIS 2.5 MG TABS tablet Take 2.5 mg by mouth 2 (two) times daily. 04/22/21   [provider]  EUTHYROX 88 MCG tablet Take 88 mcg by mouth daily before breakfast. 02/06/20   [provider]  furosemide (LASIX) 80 MG tablet Take 1 tablet (80 mg total) by mouth 4 (four) times a week. Mondays, wednesdays, fridays, and sundays Patient taking differently: Take 80 mg by mouth See admin instructions. Mondays, Wednesdays, and Saturdays 05/26/19   Mitzi Hansen, MD  insulin glargine-yfgn (SEMGLEE) 100 UNIT/ML injection Inject 0.1 mLs (10 Units total) into the skin at bedtime. 06/15/21   Arrien, Jimmy Picket, MD  LEUPROLIDE ACETATE, 6 MONTH, 45 MG injection Inject 45 mg into the skin every 6 (six) months. 05/25/19   Mitzi Hansen, MD  levothyroxine (SYNTHROID) 100 MCG tablet Take 100 mcg by mouth daily. 05/21/21   [provider]  lidocaine-prilocaine (EMLA) cream Apply 1 application topically See admin instructions. Apply small amount to access site 1-2 hours before dialysis, cover with occlusive dressing (saran wrap) 05/25/19   Christian,  Rylee, MD  loperamide (IMODIUM) 2 MG capsule Take 1 capsule (2 mg total) by mouth every 6 (six) hours as needed for diarrhea or loose stools. 06/14/21   Arrien, York Ram, MD  metoprolol tartrate (LOPRESSOR) 25 MG tablet Take 25 mg by mouth 2 (two) times daily. 02/06/20   [provider]  multivitamin (RENA-VIT) TABS tablet Take 1 tablet by mouth at bedtime. 06/14/21 07/14/21  Arrien, York Ram, MD  sertraline (ZOLOFT) 100 MG tablet  Take 100 mg by mouth daily. 02/06/20   [provider]  XTANDI 40 MG capsule Take 160 mg by mouth at bedtime. 03/12/20   [provider]      Allergies    Gabapentin    Review of Systems   Review of Systems  Respiratory:  Positive for shortness of breath.   All other systems reviewed and are negative.  Physical Exam Updated Vital Signs BP 111/79    Pulse 69    Temp (!) 97.5 F (36.4 C) (Oral)    Resp (!) 22    SpO2 94%  Physical Exam Vitals and nursing note reviewed.  Constitutional:      General: He is not in acute distress.    Appearance: Normal appearance.  HENT:     Head: Normocephalic and atraumatic.  Eyes:     General:        Right eye: No discharge.        Left eye: No discharge.  Cardiovascular:     Comments: Regular rate and rhythm.  S1/S2 are distinct without any evidence of murmur, rubs, or gallops.  Radial pulses are 2+ bilaterally.  Dorsalis pedis pulses are 2+ bilaterally.  No evidence of pedal edema. Pulmonary:     Comments: Tachypneic with increased work of breathing.  Expiratory wheeze heard throughout all lung fields.  Moderate respiratory distress. Abdominal:     General: Abdomen is flat. Bowel sounds are normal. There is no distension.     Tenderness: There is no abdominal tenderness. There is no guarding or rebound.  Musculoskeletal:        General: Normal range of motion.     Cervical back: Neck supple.  Skin:    General: Skin is warm and dry.     Findings: No rash.  Neurological:     General: No focal deficit present.     Mental Status: He is alert.  Psychiatric:        Mood and Affect: Mood normal.        Behavior: Behavior normal.    ED Results / Procedures / Treatments   Labs (all labs ordered are listed, but only abnormal results are displayed) Labs Reviewed  CBC WITH DIFFERENTIAL/PLATELET - Abnormal; Notable for the following components:      Result Value   WBC 16.7 (*)    RBC 3.07 (*)    Hemoglobin 8.6 (*)    HCT  28.1 (*)    RDW 17.5 (*)    Neutro Abs 15.6 (*)    Lymphs Abs 0.3 (*)    Abs Immature Granulocytes 0.11 (*)    All other components within normal limits  COMPREHENSIVE METABOLIC PANEL - Abnormal; Notable for the following components:   Sodium 125 (*)    Potassium 5.6 (*)    Chloride 87 (*)    CO2 20 (*)    Glucose, Bld 208 (*)    BUN 96 (*)    Creatinine, Ser 7.21 (*)    Calcium 7.3 (*)  Albumin 2.0 (*)    GFR, Estimated 8 (*)    Anion gap 18 (*)    All other components within normal limits  BRAIN NATRIURETIC PEPTIDE - Abnormal; Notable for the following components:   B Natriuretic Peptide 1,728.0 (*)    All other components within normal limits  I-STAT VENOUS BLOOD GAS, ED - Abnormal; Notable for the following components:   pCO2, Ven 33.8 (*)    Sodium 124 (*)    Potassium 5.8 (*)    Calcium, Ion 0.82 (*)    HCT 24.0 (*)    Hemoglobin 8.2 (*)    All other components within normal limits  TROPONIN I (HIGH SENSITIVITY) - Abnormal; Notable for the following components:   Troponin I (High Sensitivity) 1,178 (*)    All other components within normal limits  CULTURE, BLOOD (ROUTINE X 2)  CULTURE, BLOOD (ROUTINE X 2)  MRSA NEXT GEN BY PCR, NASAL  LACTIC ACID, PLASMA  TROPONIN I (HIGH SENSITIVITY)    EKG EKG Interpretation  Date/Time:  Saturday June 15 2021 07:55:10 EST Ventricular Rate:  70 PR Interval:    QRS Duration: 111 QT Interval:  457 QTC Calculation: 457 R Axis:   184 Text Interpretation: Atrial fibrillation Probable anterior infarct, age indeterminate Since last tracing rate slower Confirmed by Isla Pence [phone]) on 06/15/2021 8:20:28 AM  Radiology DG Chest Portable 1 View  Result Date: 06/15/2021 CLINICAL DATA:  66 year old male with history of shortness of breath. EXAM: PORTABLE CHEST 1 VIEW COMPARISON:  Chest x-ray 06/06/2021. FINDINGS: Previously noted endotracheal tube and enteric tube have been removed. Lung volumes are low. Diffuse  peribronchial cuffing, widespread interstitial prominence and patchy asymmetrically distributed airspace disease is noted throughout the lungs bilaterally (right greater than left), significantly worsened compared to the prior study. Probable small bilateral pleural effusions. No pneumothorax. Heart size is normal. The patient is rotated to the left on today's exam, resulting in distortion of the mediastinal contours and reduced diagnostic sensitivity and specificity for mediastinal pathology. Atherosclerotic calcifications are noted in the thoracic aorta. IMPRESSION: 1. Substantially worsened multilobar bilateral pneumonia. 2. Aortic atherosclerosis. Electronically Signed   By: Vinnie Langton M.D.   On: 06/15/2021 08:28    Procedures .Critical Care Performed by: Hendricks Limes, PA-C Authorized by: Hendricks Limes, PA-C   Critical care provider statement:    Critical care time (minutes):  35   Critical care time was exclusive of:  Separately billable procedures and treating other patients   Critical care was necessary to treat or prevent imminent or life-threatening deterioration of the following conditions:  Respiratory failure and cardiac failure   Critical care was time spent personally by me on the following activities:  Ordering and performing treatments and interventions, ordering and review of laboratory studies, ordering and review of radiographic studies, discussions with consultants and blood draw for specimens   I assumed direction of critical care for this patient from another provider in my specialty: no     Care discussed with: admitting provider    Cardiac monitoring does reveal atrial fibrillation with a normal rate.  Patient is 94% on his 2 L of oxygen.  Blood pressure is little soft.  Medications Ordered in ED Medications  ceFEPIme (MAXIPIME) 1 g in sodium chloride 0.9 % 100 mL IVPB (has no administration in time range)  vancomycin (VANCOREADY) IVPB 2000 mg/400 mL (has no  administration in time range)  vancomycin variable dose per unstable renal function (pharmacist dosing) (has no administration in  time range)  ipratropium-albuterol (DUONEB) 0.5-2.5 (3) MG/3ML nebulizer solution 3 mL (3 mLs Nebulization Given 06/15/21 0827)  ceFEPIme (MAXIPIME) 2 g in sodium chloride 0.9 % 100 mL IVPB (2 g Intravenous New Bag/Given 06/15/21 0958)  furosemide (LASIX) injection 80 mg (80 mg Intravenous Given 06/15/21 0951)    ED Course/ Medical Decision Making/ A&P Clinical Course as of 06/15/21 1045  Sat Jun 15, 2021  1013 I spoke with Dr. Percival Spanish with cardiology who does not recommend any medical therapy at this time and will see him in consultation. [CF]  1029 I spoke with Dr. Tamala Julian with [facility] who agrees to admit the patient.  He does recommend reaching out to nephrology. [CF]  1044 Spoke with Dr. Jonnie Finner with nephrology who agrees to consult the patient. [CF]    Clinical Course User Index [CF] Hendricks Limes, PA-C                           Medical Decision Making  Keanon Bevins Oddo is a 66 y.o. male with significant core but he is impacting his care to include recent diagnosis of COVID-pneumonia with MSSA bacteremia, chronic respiratory failure on 2 L of oxygen, congestive heart failure who presents the emergency department with shortness of breath.  I am concerned that this is acute on chronic hypoxic respiratory failure secondary to worsening pneumonia or COVID.  I considered however I believe less likely pulmonary embolism, ACS.  Patient arrived via EMS and was on a nonrebreather satting around 98%.  We removed the nonrebreather and placed him on his normal 2 L of oxygen he remained around 94% oxygen.  However, patient was profoundly tachypneic with increased work of breathing.  Ultimately decided to place the patient on BiPAP for comfort.  He was on the lowest setting for some time which improved he is tachypnic and shortness of breath.  I ordered labs and  imaging which I personally interpreted.  CBC showed leukocytosis which was significantly worse when he was staying in the hospital.  CMP showed number of electrolyte derangements including hyponatremia, hyperkalemia, hypochloremia, elevated creatinine and BUN in the setting of chronic kidney disease on dialysis.  Blood gas did not show any acidosis.  CO2 was okay.  Initial troponin was 1100 and BNP was 1700.  Believe that this troponin elevation is likely due to demand ischemia from his profound shortness of breath.  Initial and second lactic were normal.  Blood cultures were obtained and are pending.  Ultimately, the patient was removed from BiPAP and placed back on 2 L of oxygen and he has been maintaining good oxygenation since then.  Started the patient on vancomycin and cefepime for broad antibiotic coverage as chest x-ray did show worsening pneumonia.  I do agree with the radiologist of rotation of this.  I consulted with cardiology with regards to his elevated troponin level and nephrology regarding his end-stage renal disease on dialysis.  Both consultants agreed to consult on the patient.  I spoke with Dr. Tamala Julian with [facility] who agrees to admit the patient.  Clinical scenario, I do believe he would benefit from further evaluation in the hospital for reasons mentioned above.   Final Clinical Impression(s) / ED Diagnoses Final diagnoses:  Acute on chronic respiratory failure with hypoxia ([place])  Pneumonia due to COVID-19 virus    Rx / DC Orders ED Discharge Orders     None         Raul Del,  Leanora Cover, PA-C 06/15/21 1152    Isla Pence, MD 06/15/21 [phone]

## 2021-06-15 NOTE — Plan of Care (Signed)
Admitted into room 5W24. On oxygen at 4L [place]. Oriented to room  and call bell

## 2021-06-15 NOTE — Progress Notes (Signed)
Pharmacy Antibiotic Note  Richard Hall is a 66 y.o. male admitted on 06/15/2021 with pneumonia.  Pharmacy has been consulted for vancomycin and cefepime dosing. Patient is ESRD on HD MWF. Patient with MSSA bacteremia and on cefazolin prior to admission.   Plan: Vancomycin 2000mg  x1 loading dose Follow up iHD schedule to adding maintenance vancomycin dose with iHD Cefepime 2g x1 followed by 1g q24h  Monitor iHD schedule, cultures and clinical progression      Temp (24hrs), Avg:97.8 F (36.6 C), Min:97.5 F (36.4 C), Max:98 F (36.7 C)  Recent Labs  Lab 06/09/21 0343 06/10/21 0451 06/13/21 0324 06/13/21 2148 06/14/21 0206 06/15/21 0833  WBC 10.7* 11.7*  --  9.9  --  16.7*  CREATININE 8.88* 6.67* 6.72*  --  5.87*  --     Estimated Creatinine Clearance: 16.4 mL/min (A) (by C-G formula based on SCr of 5.87 mg/dL (H)).    Allergies  Allergen Reactions   Gabapentin Swelling    Antimicrobials this admission: Cefazolin prior to admission Cefepime 1/14 >> Vancomycin 1/14 >>  Dose adjustments this admission:   Microbiology results: 1/14 bcx:   Thank you for allowing pharmacy to be a part of this patients care.  Cristela Felt, PharmD, BCPS Clinical Pharmacist 06/15/2021 9:50 AM

## 2021-06-15 NOTE — Procedures (Signed)
° °  I was present at this dialysis session, have reviewed the session itself and made  appropriate changes Kelly Splinter MD [place] pager [phone]   06/15/2021, 1:24 PM

## 2021-06-15 NOTE — H&P (Addendum)
History and Physical    Richard Hall PWE:[id number] DOB: December 10, 1955 DOA: 06/15/2021  Referring MD/NP/PA: Enos Fling, PA-C PCP: Regino Bellow, MD  Patient coming from: [facility] via EMS  Chief Complaint: Shortness of breath and cough  I have personally briefly reviewed patient's old medical records in [facility]   HPI: Richard Hall is a 66 y.o. male with medical history significant of hypertension, atrial fibrillation, congestive heart failure, CVA, ESRD on HD, and prostate cancer presents with complaints of shortness of breath.  Patient had just recently been hospitalized from 12/30-1/13 after initially presenting with septic shock related to MSSA bacteremia and COVID-19 viral pneumonia.  During his hospital stay patient required intubation.  ID had been consulted and patient was recommended to complete 6 weeks of cefazolin to be completed on 06/20/2021.  After being able to be extubated patient had nasogastric tube in place until yesterday and was recommended to be on a dysphagia 2 diet.  It was reported that the patient was at the rehab facility for for approximately 4 hours prior to being found to have drop in O2 saturations.  In route with EMS patient was noted to have O2 saturations of 80% on 2 L of nasal cannula oxygen.  Placed on nonrebreather with O2 saturations 98%.  ED Course: On admission to the emergency department patient was seen to be afebrile, respirations 13-26, blood pressures 101/62 121/60, and O2 saturations maintained on BiPAP.  Labs significant for WBC 16.7, hemoglobin 8.6, sodium 124, potassium 5.8, CO2 20, BUN 96, creatinine 7.21, glucose 208, anion gap 18, calcium 7.3, lactic acid 1.2, troponin 1178, and BNP 1728.  Venous blood gas did not show any signs of hypercapnia.  Chest x-ray showed some substantially worsened Multilobar bilateral pneumonia.  Cardiology have been consulted, but suspect symptoms were secondary to demand.  Patient had  been given Lasix 80 mg IV x1 dose, DuoNeb breathing treatment, vancomycin, and cefepime.  Review of Systems  Constitutional:  Positive for malaise/fatigue.  Respiratory:  Positive for cough and shortness of breath.   Cardiovascular:  Positive for leg swelling.   Past Medical History:  Diagnosis Date   A-V fistula (HCC)    left arm   Anemia    Arthritis    CHF (congestive heart failure) (HCC)    CVA (cerebral vascular accident) (HCC) 2010   Diabetes mellitus with end-stage renal disease (HCC)    Dyalisis pt.   Diabetic neuropathy (HCC)    Bilateral feet   Dyslipidemia    Dysrhythmia    A-fib   Essential hypertension    Hepatitis    childhood   History of blood transfusion    Hypothyroidism (acquired)    OSA on CPAP    Prostate cancer [facility])    Uses wheelchair     Past Surgical History:  Procedure Laterality Date   APPENDECTOMY     COLONOSCOPY     FEMUR IM NAIL Left 05/20/2019   Procedure: Intramedullary (Im) Retrograde Femoral Nailing for impending pathologic fracture;  Surgeon: Myrene Galas, MD;  Location: [facility] OR;  Service: Orthopedics;  Laterality: Left;   ORIF FEMUR FRACTURE Left 05/20/2019   Procedure: OPEN REDUCTION INTERNAL FIXATION (ORIF) DISTAL FEMUR FRACTURE with intercondylar extension;  Surgeon: Myrene Galas, MD;  Location: [facility] OR;  Service: Orthopedics;  Laterality: Left;   TRANSURETHRAL RESECTION OF BLADDER TUMOR N/A 01/07/2019   Procedure: CYSTOSCOPY/ EVEACUATION CLOT;  Surgeon: Alfredo Martinez, MD;  Location: [facility] ORS;  Service: Urology;  Laterality: N/A;   TRANSURETHRAL RESECTION OF PROSTATE N/A 07/26/2020   Procedure: TRANSURETHRAL RESECTION OF THE PROSTATE (TURP);  Surgeon: Alexis Frock, MD;  Location: [facility] ORS;  Service: Urology;  Laterality: N/A;  75 MINS     reports that he quit smoking about 26 years ago. He has never used smokeless tobacco. He reports that he does not drink alcohol and does not use drugs.  Allergies  Allergen Reactions    Gabapentin Swelling    Family History  Problem Relation Age of Onset   Cancer Mother     Prior to Admission medications   Medication Sig Start Date End Date Taking? Authorizing Provider  acetaminophen (TYLENOL) 500 MG tablet Take 500-1,000 mg by mouth every 6 (six) hours as needed (for pain.).    [provider]  amitriptyline (ELAVIL) 100 MG tablet Take 1 tablet (100 mg total) by mouth at bedtime. 05/25/19   Mitzi Hansen, MD  atorvastatin (LIPITOR) 80 MG tablet Take 80 mg by mouth daily. 03/08/20   [provider]  ceFAZolin (ANCEF) IVPB Inject 2 g into the vein every Monday, Wednesday, and Friday at 6 PM. Indication: MSSA bacteremia. Give cefazolin IV dose after each hemodialysis session MWF. First Dose: No Last Day of Therapy:  07/17/2021 Labs - Once weekly:  CBC/D and BMP, Labs - Every other week:  ESR and CRP Method of administration: IV Push 06/17/21   Arrien, Jimmy Picket, MD  diphenoxylate-atropine (LOMOTIL) 2.5-0.025 MG tablet Take 1 tablet by mouth 4 (four) times daily as needed for diarrhea or loose stools. 04/23/21   [provider]  ELIQUIS 2.5 MG TABS tablet Take 2.5 mg by mouth 2 (two) times daily. 04/22/21   [provider]  EUTHYROX 88 MCG tablet Take 88 mcg by mouth daily before breakfast. 02/06/20   [provider]  furosemide (LASIX) 80 MG tablet Take 1 tablet (80 mg total) by mouth 4 (four) times a week. Mondays, wednesdays, fridays, and sundays Patient taking differently: Take 80 mg by mouth See admin instructions. Mondays, Wednesdays, and Saturdays 05/26/19   Mitzi Hansen, MD  insulin glargine-yfgn (SEMGLEE) 100 UNIT/ML injection Inject 0.1 mLs (10 Units total) into the skin at bedtime. 06/15/21   Arrien, Jimmy Picket, MD  LEUPROLIDE ACETATE, 6 MONTH, 45 MG injection Inject 45 mg into the skin every 6 (six) months. 05/25/19   Mitzi Hansen, MD  levothyroxine (SYNTHROID) 100 MCG tablet Take 100 mcg by mouth daily.  05/21/21   [provider]  lidocaine-prilocaine (EMLA) cream Apply 1 application topically See admin instructions. Apply small amount to access site 1-2 hours before dialysis, cover with occlusive dressing (saran wrap) 05/25/19   Mitzi Hansen, MD  loperamide (IMODIUM) 2 MG capsule Take 1 capsule (2 mg total) by mouth every 6 (six) hours as needed for diarrhea or loose stools. 06/14/21   Arrien, Jimmy Picket, MD  metoprolol tartrate (LOPRESSOR) 25 MG tablet Take 25 mg by mouth 2 (two) times daily. 02/06/20   [provider]  multivitamin (RENA-VIT) TABS tablet Take 1 tablet by mouth at bedtime. 06/14/21 07/14/21  Arrien, Jimmy Picket, MD  sertraline (ZOLOFT) 100 MG tablet Take 100 mg by mouth daily. 02/06/20   [provider]  XTANDI 40 MG capsule Take 160 mg by mouth at bedtime. 03/12/20   [provider]    Physical Exam:  Constitutional: Elderly male who appears chronically ill, but able to follow commands Vitals:   06/15/21 0901 06/15/21 0945 06/15/21 1000 06/15/21 1015  BP:  121/60 (!) 106/53 111/79  Pulse:  67 69 69  Resp:  13 18 (!) 22  Temp:      TempSrc:      SpO2: 100% 100% 100% 94%   Eyes: PERRL, lids and conjunctivae normal ENMT: Mucous membranes are moist. Posterior pharynx clear of any exudate or lesions.  Neck: normal, supple, no masses, no thyromegaly Respiratory: Decreased breath sounds with O2 saturations currently maintained on 4 L nasal cannula oxygen. Cardiovascular: Irregular irregular.  Lower extremity edema improved post dialysis.  Left upper extremity fistula in place Abdomen: no tenderness. Bowel sounds positive.  Musculoskeletal: no clubbing / cyanosis. No joint deformity upper and lower extremities. Good ROM, no contractures. Normal muscle tone.  Skin: no rashes Neurologic: CN 2-12 grossly intact. Sensation intact, DTR normal. Strength 5/5 in all 4.  Psychiatric: Normal judgment and insight.  Lethargic but oriented  x3.    Labs on Admission: I have personally reviewed following labs and imaging studies  CBC: Recent Labs  Lab 06/09/21 0343 06/10/21 0451 06/13/21 2148 06/15/21 0833 06/15/21 0843  WBC 10.7* 11.7* 9.9 16.7*  --   NEUTROABS  --   --   --  15.6*  --   HGB 7.1* 8.0* 9.0* 8.6* 8.2*  HCT 22.9* 25.4* 27.6* 28.1* 24.0*  MCV 93.1 91.4 89.9 91.5  --   PLT 204 219 221 218  --    Basic Metabolic Panel: Recent Labs  Lab 06/09/21 0343 06/10/21 0451 06/13/21 0324 06/14/21 0206 06/15/21 0833 06/15/21 0843  NA 132* 137 126* 128* 125* 124*  K 5.2* 4.1 4.0 3.7 5.6* 5.8*  CL 95* 98 90* 93* 87*  --   CO2 20* $Remov'25 23 26 'aSTEmO$ 20*  --   GLUCOSE 135* 69* 177* 185* 208*  --   BUN 134* 86* 87* 72* 96*  --   CREATININE 8.88* 6.67* 6.72* 5.87* 7.21*  --   CALCIUM 7.4* 7.7* 6.7* 6.8* 7.3*  --   MG 2.7* 2.6*  --   --   --   --    GFR: Estimated Creatinine Clearance: 13.3 mL/min (A) (by C-G formula based on SCr of 7.21 mg/dL (H)). Liver Function Tests: Recent Labs  Lab 06/09/21 0343 06/15/21 0833  AST 30 27  ALT 28 5  ALKPHOS 68 89  BILITOT 0.8 0.8  PROT 5.9* 6.7  ALBUMIN 1.8* 2.0*   No results for input(s): LIPASE, AMYLASE in the last 168 hours. No results for input(s): AMMONIA in the last 168 hours. Coagulation Profile: No results for input(s): INR, PROTIME in the last 168 hours. Cardiac Enzymes: No results for input(s): CKTOTAL, CKMB, CKMBINDEX, TROPONINI in the last 168 hours. BNP (last 3 results) No results for input(s): PROBNP in the last 8760 hours. HbA1C: No results for input(s): HGBA1C in the last 72 hours. CBG: Recent Labs  Lab 06/14/21 0002 06/14/21 0400 06/14/21 0739 06/14/21 0826 06/14/21 1124  GLUCAP 337* 167* 60* 87 150*   Lipid Profile: No results for input(s): CHOL, HDL, LDLCALC, TRIG, CHOLHDL, LDLDIRECT in the last 72 hours. Thyroid Function Tests: No results for input(s): TSH, T4TOTAL, FREET4, T3FREE, THYROIDAB in the last 72 hours. Anemia Panel: No  results for input(s): VITAMINB12, FOLATE, FERRITIN, TIBC, IRON, RETICCTPCT in the last 72 hours. Urine analysis:    Component Value Date/Time   COLORURINE RED (A) 01/06/2019 1118   APPEARANCEUR TURBID (A) 01/06/2019 1118   LABSPEC  01/06/2019 1118    TEST NOT REPORTED DUE TO COLOR INTERFERENCE OF URINE PIGMENT  PHURINE  01/06/2019 1118    TEST NOT REPORTED DUE TO COLOR INTERFERENCE OF URINE PIGMENT   GLUCOSEU (A) 01/06/2019 1118    TEST NOT REPORTED DUE TO COLOR INTERFERENCE OF URINE PIGMENT   HGBUR (A) 01/06/2019 1118    TEST NOT REPORTED DUE TO COLOR INTERFERENCE OF URINE PIGMENT   BILIRUBINUR (A) 01/06/2019 1118    TEST NOT REPORTED DUE TO COLOR INTERFERENCE OF URINE PIGMENT   KETONESUR (A) 01/06/2019 1118    TEST NOT REPORTED DUE TO COLOR INTERFERENCE OF URINE PIGMENT   PROTEINUR (A) 01/06/2019 1118    TEST NOT REPORTED DUE TO COLOR INTERFERENCE OF URINE PIGMENT   NITRITE (A) 01/06/2019 1118    TEST NOT REPORTED DUE TO COLOR INTERFERENCE OF URINE PIGMENT   LEUKOCYTESUR (A) 01/06/2019 1118    TEST NOT REPORTED DUE TO COLOR INTERFERENCE OF URINE PIGMENT   Sepsis Labs: Recent Results (from the past 240 hour(s))  Respiratory (~20 pathogens) panel by PCR     Status: None   Collection Time: 06/05/21  6:00 PM   Specimen: Nasopharyngeal Swab; Respiratory  Result Value Ref Range Status   Adenovirus NOT DETECTED NOT DETECTED Final   Coronavirus 229E NOT DETECTED NOT DETECTED Final    Comment: (NOTE) The Coronavirus on the Respiratory Panel, DOES NOT test for the novel  Coronavirus (2019 nCoV)    Coronavirus HKU1 NOT DETECTED NOT DETECTED Final   Coronavirus NL63 NOT DETECTED NOT DETECTED Final   Coronavirus OC43 NOT DETECTED NOT DETECTED Final   Metapneumovirus NOT DETECTED NOT DETECTED Final   Rhinovirus / Enterovirus NOT DETECTED NOT DETECTED Final   Influenza A NOT DETECTED NOT DETECTED Final   Influenza B NOT DETECTED NOT DETECTED Final   Parainfluenza Virus 1 NOT DETECTED  NOT DETECTED Final   Parainfluenza Virus 2 NOT DETECTED NOT DETECTED Final   Parainfluenza Virus 3 NOT DETECTED NOT DETECTED Final   Parainfluenza Virus 4 NOT DETECTED NOT DETECTED Final   Respiratory Syncytial Virus NOT DETECTED NOT DETECTED Final   Bordetella pertussis NOT DETECTED NOT DETECTED Final   Bordetella Parapertussis NOT DETECTED NOT DETECTED Final   Chlamydophila pneumoniae NOT DETECTED NOT DETECTED Final   Mycoplasma pneumoniae NOT DETECTED NOT DETECTED Final    Comment: Performed at [facility], [address], [place], [place] [address]  Culture, blood (routine x 2)     Status: None   Collection Time: 06/06/21 10:30 AM   Specimen: BLOOD RIGHT WRIST  Result Value Ref Range Status   Specimen Description BLOOD RIGHT WRIST  Final   Special Requests   Final    BOTTLES DRAWN AEROBIC AND ANAEROBIC Blood Culture adequate volume   Culture   Final    NO GROWTH 5 DAYS Performed at [facility], [place] [address] [place], [place] [address]    Report Status 06/11/2021 FINAL  Final  Culture, blood (routine x 2)     Status: None   Collection Time: 06/06/21 10:45 AM   Specimen: BLOOD RIGHT HAND  Result Value Ref Range Status   Specimen Description BLOOD RIGHT HAND  Final   Special Requests   Final    BOTTLES DRAWN AEROBIC AND ANAEROBIC Blood Culture results may not be optimal due to an excessive volume of blood received in culture bottles   Culture   Final    NO GROWTH 5 DAYS Performed at [facility], [place] [address] [place], [place] [address]    Report Status 06/11/2021 FINAL  Final     Radiological Exams on Admission: DG Chest Portable 1 View  Result Date: 06/15/2021 CLINICAL DATA:  66 year old male with history of shortness of breath. EXAM: PORTABLE CHEST 1 VIEW COMPARISON:  Chest x-ray 06/06/2021. FINDINGS: Previously noted endotracheal tube and enteric tube have been removed. Lung volumes are low. Diffuse peribronchial cuffing, widespread interstitial  prominence and patchy asymmetrically distributed airspace disease is noted throughout the lungs bilaterally (right greater than left), significantly worsened compared to the prior study. Probable small bilateral pleural effusions. No pneumothorax. Heart size is normal. The patient is rotated to the left on today's exam, resulting in distortion of the mediastinal contours and reduced diagnostic sensitivity and specificity for mediastinal pathology. Atherosclerotic calcifications are noted in the thoracic aorta. IMPRESSION: 1. Substantially worsened multilobar bilateral pneumonia. 2. Aortic atherosclerosis. Electronically Signed   By: Vinnie Langton M.D.   On: 06/15/2021 08:28    EKG: Independently reviewed.  Atrial fibrillation at 70 bpm  Assessment/Plan Acute on chronic respiratory failure with hypoxia secondary to fluid overload  ESRD on HD: Patient presents after just being discharged from the hospital yesterday found to be hypoxic and 80% on 2 L of nasal cannula oxygen at rehab facility 4 hours after leaving.  He was temporarily placed on BiPAP but able to be weaned back to nasal cannula oxygen 4 L.  Chest x-ray showing substantially worsened multilobar bilateral pneumonia, but suspect more likely edema.  Sodium noted to be as low as 124, potassium 5.6, BUN 96,  creatinine 7.21, and BNP 1728.   Patient was reported to have significant lower extremity edema.  Blood cultures had been obtained and antibiotics expanded to vancomycin and cefepime.  However, given worsening hyponatremia and elevated BNP suspect patient is more so fluid overload as cause of symptoms.  They have been consulted and dialyzed the patient with some improvement in symptoms. -Admit to a progressive bed -Continuous pulse oximetry with nasal cannula oxygen maintain O2 saturation greater than 92% -Aspiration precautions with elevation of head of bed -Incentive spirometry and flutter valve -Appreciate nephrology consultative services.   Dialysis per nephrology  SIRS: Acute. Patient was noted to be tachypneic with WBC elevated at 16.7.  Lactic acid was reassuring at 1.2, but noted to be in respiratory failure.  Cause of symptoms thought to be secondary to above. -Follow-up blood and sputum cultures -Check procalcitonin -De-escalated antibiotics back to cefazolin as symptoms appear to be more so related with volume overload more so than correction.  Recommend broadening antibiotics if patient spikes fever back to vancomycin and cefepime.  Elevated troponin: Acute.  High-sensitivity troponin elevated at 1178.  Cardiology had been consulted but felt symptoms secondary to demand in setting of respiratory stress and or fluid overload. -[place] cardiology consultative services, will follow-up for any further recommendation  History of viral pneumonia due to COVID-19  MSSA bacteremia: Patient has been treated with remdesivir during last hospitalization after being diagnosed with COVID-19 on 05/31/2021.  During that hospitalization patient was also noted to have bacteremia with MSSA.  He was evaluated by ID and was supposed to be on cefazolin until 1/19. -Follow-up blood cultures -Continue cefazolin  Hyperkalemia: Potassium noted to be up to 5.8.  Patient has been given Lasix 80 mg IV x1 dose -Will give Lokelma if able to be taken off of BiPAP  Hyponatremia :Acute on chronic.  Initial sodium noted to be as low as 124.  Suspect  hypervolemic hyponatremia given patient appears to be possibly fluid overloaded. -As noted  above  Dysphagia: Patient had been previously evaluated by speech therapy recommended to be on a dysphagia 2 diet on 1/11 -Aspiration precautions with elevation of the head of the bed -Dysphagia 2 diet  Hypertension: Blood pressures have been soft. Blood pressure regimen included metoprolol 25 mg twice daily, furosemide 80 mg Mondays Wednesdays, Fridays, and Sundays.  He had been given 80 mg Lasix while in the  ED. -Started on midodrine 10 mg 3 times daily by nephrology -Continue to monitor and resume home regimen of metoprolol and Lasix when able  Chronic atrial fibrillation on chronic anticoagulation: Patient appears to be rate controlled at this time. -Continue Eliquis  Diabetes mellitus type 2: On admission glucose 202.  Glucose regimen includes Semglee 10 units nightly. -Hypoglycemic protocol -Continue Semglee nightly once patient starts eating -CBGs with very sensitive SSI  Anemia of chronic kidney disease: Hemoglobin 8.6 which appears near patient's baseline.  -Continue to monitor kidney function  Hyperlipidemia -Continue atorvastatin  Hypothyroidism: TSH was 3.011 on 06/04/2021. -Continue levothyroxine  History of prostate cancer -Continue Xtandi  Obesity: BMI 35.16 kg/m2  DVT prophylaxis: Continue Eliquis Code Status: Full Family Communication: None requested Disposition Plan: Likely discharge back to rehab once medically stable Consults called: Cardiology and nephrology Admission status: Inpatient, require more than 2 midnight stay  Norval Morton MD [facility]   If 7PM-7AM, please contact night-coverage   06/15/2021, 10:32 AM

## 2021-06-15 NOTE — ED Triage Notes (Signed)
Pt BIB [facility] from [facility] rehab in [place]. Pt was 80% on his 2L [place]. Pt was discharged yesterday with Covid pneumonia. Pt was given a neb treatment and placed on NBR and is now 98%.    140/90 98% NRB 315 CBG 63 HR

## 2021-06-15 NOTE — Progress Notes (Signed)
[facility] Progress Note   Subjective: Seen in ED, pt was dc'd to SNF yesterday and returns now w/ SOB and resp distress.  CXR + diffuse edema, poss pna. Asked to see for dialysis. Got 1 hr bipap in ED at low settings and was dc'd because he was doing too good. Pt w/o c/o at this time. SpO2 80's because he took off his nasal O2.   Objective Vitals:   06/15/21 0945 06/15/21 1000 06/15/21 1015 06/15/21 1030  BP: 121/60 (!) 106/53 111/79 (!) 108/57  Pulse: 67 69 69 65  Resp: 13 18 (!) 22 17  Temp:      TempSrc:      SpO2: 100% 100% 94% 92%   Physical Exam General: Chronically ill male in NAD Heart: S1,S2 irreg, irreg. No M/R/G. Afib on monitor. Rate is controlled.  Lungs: Slightly decreased in bases otherwise CTAB Abdomen: Obese, NABS, NT Extremities: 1-2 + hip edema and 1+ pretib Dialysis Access: L AVF very bruised. +T/B   Additional Objective Labs: Basic Metabolic Panel: Recent Labs  Lab 06/13/21 0324 06/14/21 0206 06/15/21 0833 06/15/21 0843  NA 126* 128* 125* 124*  K 4.0 3.7 5.6* 5.8*  CL 90* 93* 87*  --   CO2 23 26 20*  --   GLUCOSE 177* 185* 208*  --   BUN 87* 72* 96*  --   CREATININE 6.72* 5.87* 7.21*  --   CALCIUM 6.7* 6.8* 7.3*  --     Liver Function Tests: Recent Labs  Lab 06/09/21 0343 06/15/21 0833  AST 30 27  ALT 28 5  ALKPHOS 68 89  BILITOT 0.8 0.8  PROT 5.9* 6.7  ALBUMIN 1.8* 2.0*    No results for input(s): LIPASE, AMYLASE in the last 168 hours. CBC: Recent Labs  Lab 06/09/21 0343 06/10/21 0451 06/13/21 2148 06/15/21 0833 06/15/21 0843  WBC 10.7* 11.7* 9.9 16.7*  --   NEUTROABS  --   --   --  15.6*  --   HGB 7.1* 8.0* 9.0* 8.6* 8.2*  HCT 22.9* 25.4* 27.6* 28.1* 24.0*  MCV 93.1 91.4 89.9 91.5  --   PLT 204 219 221 218  --     Blood Culture    Component Value Date/Time   SDES BLOOD RIGHT HAND 06/06/2021 1045   SPECREQUEST  06/06/2021 1045    BOTTLES DRAWN AEROBIC AND ANAEROBIC Blood Culture results may not be  optimal due to an excessive volume of blood received in culture bottles   CULT  06/06/2021 1045    NO GROWTH 5 DAYS Performed at [facility] [address] [place], [place] [address]    REPTSTATUS 06/11/2021 FINAL 06/06/2021 1045    Cardiac Enzymes: No results for input(s): CKTOTAL, CKMB, CKMBINDEX, TROPONINI in the last 168 hours. CBG: Recent Labs  Lab 06/14/21 0002 06/14/21 0400 06/14/21 0739 06/14/21 0826 06/14/21 1124  GLUCAP 337* 167* 60* 87 150*    Iron Studies: No results for input(s): IRON, TIBC, TRANSFERRIN, FERRITIN in the last 72 hours. @lablastinr3 @ Studies/Results: DG Chest Portable 1 View  Result Date: 06/15/2021 CLINICAL DATA:  66 year old male with history of shortness of breath. EXAM: PORTABLE CHEST 1 VIEW COMPARISON:  Chest x-ray 06/06/2021. FINDINGS: Previously noted endotracheal tube and enteric tube have been removed. Lung volumes are low. Diffuse peribronchial cuffing, widespread interstitial prominence and patchy asymmetrically distributed airspace disease is noted throughout the lungs bilaterally (right greater than left), significantly worsened compared to the prior study. Probable small bilateral pleural effusions.  No pneumothorax. Heart size is normal. The patient is rotated to the left on today's exam, resulting in distortion of the mediastinal contours and reduced diagnostic sensitivity and specificity for mediastinal pathology. Atherosclerotic calcifications are noted in the thoracic aorta. IMPRESSION: 1. Substantially worsened multilobar bilateral pneumonia. 2. Aortic atherosclerosis. Electronically Signed   By: Vinnie Langton M.D.   On: 06/15/2021 08:28   Medications:  [START ON 06/16/2021] ceFEPime (MAXIPIME) IV     vancomycin      Chlorhexidine Gluconate Cloth  6 each Topical Q0600   midodrine  10 mg Oral TID WC   sodium zirconium cyclosilicate  10 g Oral Once   vancomycin variable dose per unstable renal function (pharmacist dosing)    Does not apply See admin instructions     AOP HD: [facility] MWF  4h  450 bfr  123kg  2/2 bath  LU AVF   Hep none  - mircera 225 mcg every 2 weeks - last received 200 mcg on 12/14  - venofer 100 mg (ordered for 10 doses)  - no vdra    Assessment/Plan:  AHRF - CXR looks like pulm edema and has sig edema in LE's on exam. Will plan HD asap today upstairs w/ O2 support.  Recent COVID pna - was on vent 1/05 to 1/08 ESRD - HD MWF. Last HD here 1/12. Will get HD today upstairs off schedule. Then next HD Monday to get back on schedule BP/ volume - BP's soft, on midodrine. Left here about 5-7kg under dry wt, will get weights post HD when he gets in a room.  Anemia ckd - was on darbe 200 mcg/wk while here, got prbcs also.  MBD ckd - cont auryxia ac.  Atrial Fibrillation w/ RVR - S/p amio   Kelly Splinter, MD 06/15/2021, 11:41 AM

## 2021-06-15 NOTE — Progress Notes (Signed)
Patient noted to be lethargic still post dialysis and 4 L of oxygen to maintain O2 saturations. Check ABG, but will order BiPAP nightly for the time being.

## 2021-06-15 NOTE — Progress Notes (Signed)
RT obtained ABG, and sent ABG to lab. Lab notified.

## 2021-06-15 NOTE — Consult Note (Addendum)
Cardiology Consultation:   Patient ID: Richard Hall MRN: [id number]; DOB: Sep 07, 1955  Admit date: 06/15/2021 Date of Consult: 06/15/2021  PCP:  [name], MD   [facility] Providers Cardiologist:  None        Patient Profile:   Richard Hall is a 66 y.o. male with a hx of paroxysmal afib anticoagulated on Eliquis, chronic diastolic CHF on home O2 2 lpm, HTN, prior CVA, ESRD on HD, recurrent GIB, and chronic anemia getting iron infusions, Watchman device implanted 04/18/2021, who is being seen 06/15/2021 for the evaluation of atrial fibrillation and CHF at the request of Dr. Tamala Julian.  History of Present Illness:   Richard Hall was hospitalized 05/31/2021- 06/14/2021 for acute on chronic respiratory failure in the setting of SARS COVID-19 viral pneumonia, septic shock and MSSA bacteremia.  During that admission, he was on the ventilator from 1/03-1/10.  Cardiology was consulted for rapid atrial fibrillation, but he was also in heart failure.  The heart failure was treated with dialysis.  Initially in atrial fibrillation, his heart rate was quite high, he was on amiodarone for rate control, but this was discontinued once he improved.  He was discharged on metoprolol.  Eliquis was started for anticoagulation.  An echo was performed and his EF was preserved, RV function normal as well.  Richard Hall states that after discharge, he went home and thought he was doing okay.  However, overnight, he developed severely worsened shortness of breath.  He was on his home oxygen of 2 L, but could not breathe.  That is why he came back to the hospital.  He was on BiPAP at first and improved somewhat.  He is now on oxygen by nasal cannula at 4 L and oxygen saturation is 90%.  Off O2, his oxygen saturation was in the low 80s.  He is not aware of the atrial fibrillation and does not think his heart rate was too high.   Past Medical History:  Diagnosis Date   A-V fistula ([place])    left arm    Anemia    Arthritis    CHF (congestive heart failure) (HCC)    CVA (cerebral vascular accident) ([place]) 2010   Diabetes mellitus with end-stage renal disease ([place])    Dyalisis pt.   Diabetic neuropathy (HCC)    Bilateral feet   Dyslipidemia    Dysrhythmia    A-fib   Essential hypertension    Hepatitis    childhood   History of blood transfusion    Hypothyroidism (acquired)    OSA on CPAP    Prostate cancer [facility])    Uses wheelchair     Past Surgical History:  Procedure Laterality Date   APPENDECTOMY     COLONOSCOPY     FEMUR IM NAIL Left 05/20/2019   Procedure: Intramedullary (Im) Retrograde Femoral Nailing for impending pathologic fracture;  Surgeon: Altamese [place], MD;  Location: [facility];  Service: Orthopedics;  Laterality: Left;   ORIF FEMUR FRACTURE Left 05/20/2019   Procedure: OPEN REDUCTION INTERNAL FIXATION (ORIF) DISTAL FEMUR FRACTURE with intercondylar extension;  Surgeon: Altamese Topaz, MD;  Location: [facility];  Service: Orthopedics;  Laterality: Left;   TRANSURETHRAL RESECTION OF BLADDER TUMOR N/A 01/07/2019   Procedure: CYSTOSCOPY/ EVEACUATION CLOT;  Surgeon: Bjorn Loser, MD;  Location: [facility];  Service: Urology;  Laterality: N/A;   TRANSURETHRAL RESECTION OF PROSTATE N/A 07/26/2020   Procedure: TRANSURETHRAL RESECTION OF THE PROSTATE (TURP);  Surgeon: Alexis Frock, MD;  Location: [facility];  Service: Urology;  Laterality: N/A;  75 MINS     Home Medications:  Prior to Admission medications   Medication Sig Start Date End Date Taking? Authorizing Provider  acetaminophen (TYLENOL) 500 MG tablet Take 1,000 mg by mouth every 6 (six) hours as needed for mild pain.   Yes [provider]  amitriptyline (ELAVIL) 100 MG tablet Take 1 tablet (100 mg total) by mouth at bedtime. 05/25/19  Yes Christian, Rylee, MD  atorvastatin (LIPITOR) 80 MG tablet Take 80 mg by mouth every evening. 03/08/20  Yes [provider]  diphenoxylate-atropine (LOMOTIL) 2.5-0.025 MG  tablet Take 1 tablet by mouth every 6 (six) hours as needed for diarrhea or loose stools. 04/23/21  Yes [provider]  ELIQUIS 2.5 MG TABS tablet Take 2.5 mg by mouth 2 (two) times daily. 04/22/21  Yes [provider]  EUTHYROX 88 MCG tablet Take 88 mcg by mouth daily before breakfast. 02/06/20  Yes [provider]  furosemide (LASIX) 80 MG tablet Take 1 tablet (80 mg total) by mouth 4 (four) times a week. Mondays, wednesdays, fridays, and sundays Patient taking differently: Take 80 mg by mouth See admin instructions. 97m daily on Monday's, Wednesday's, Friday's, and Sunday's 05/26/19  Yes Christian, Rylee, MD  guaiFENesin (MUCINEX) 600 MG 12 hr tablet Take 600 mg by mouth every 12 (twelve) hours as needed for cough.   Yes [provider]  insulin glargine-yfgn (SEMGLEE) 100 UNIT/ML injection Inject 0.1 mLs (10 Units total) into the skin at bedtime. 06/15/21  Yes Arrien, MJimmy Picket MD  ipratropium-albuterol (DUONEB) 0.5-2.5 (3) MG/3ML SOLN Take 3 mLs by nebulization every 6 (six) hours as needed (shortness of breath).   Yes [provider]  levothyroxine (SYNTHROID) 100 MCG tablet Take 100 mcg by mouth daily. 05/21/21  Yes [provider]  lidocaine-prilocaine (EMLA) cream Apply 1 application topically See admin instructions. Apply small amount to access site 1-2 hours before dialysis, cover with occlusive dressing (saran wrap) Patient taking differently: Apply 1 application topically every Monday, Wednesday, and Friday. Apply small amount to access site 1-2 hours before dialysis, cover with occlusive dressing (saran wrap) 05/25/19  Yes Christian, Rylee, MD  loperamide (IMODIUM) 2 MG capsule Take 1 capsule (2 mg total) by mouth every 6 (six) hours as needed for diarrhea or loose stools. 06/14/21  Yes Arrien, MJimmy Picket MD  metoprolol tartrate (LOPRESSOR) 25 MG tablet Take 25 mg by mouth 2 (two) times daily. 02/06/20  Yes [provider]  Multiple Vitamin (MULTIVITAMIN) tablet Take 1 tablet by mouth daily.   Yes [provider]  sertraline (ZOLOFT) 100 MG tablet Take 100 mg by mouth daily. 02/06/20  Yes [provider]  XTANDI 40 MG capsule Take 160 mg by mouth at bedtime. 03/12/20  Yes [provider]  ceFAZolin (ANCEF) IVPB Inject 2 g into the vein every Monday, Wednesday, and Friday at 6 PM. Indication: MSSA bacteremia. Give cefazolin IV dose after each hemodialysis session MWF. First Dose: No Last Day of Therapy:  07/17/2021 Labs - Once weekly:  CBC/D and BMP, Labs - Every other week:  ESR and CRP Method of administration: IV Push Patient not taking: Reported on 06/15/2021 06/17/21   Arrien, MJimmy Picket MD  LEUPROLIDE ACETATE, 6 MONTH, 45 MG injection Inject 45 mg into the skin every 6 (six) months. Patient not taking: Reported on 06/15/2021 05/25/19   CMitzi Hansen MD  multivitamin (RENA-VIT) TABS tablet Take 1 tablet by mouth at bedtime. Patient  not taking: Reported on 06/15/2021 06/14/21 07/14/21  Arrien, Jimmy Picket, MD    Inpatient Medications: Scheduled Meds:  vancomycin variable dose per unstable renal function (pharmacist dosing)   Does not apply See admin instructions   Continuous Infusions:  [START ON 06/16/2021] ceFEPime (MAXIPIME) IV     vancomycin     PRN Meds:   Allergies:    Allergies  Allergen Reactions   Gabapentin Swelling    Social History:   Social History   Socioeconomic History   Marital status: Single    Spouse name: Not on file   Number of children: Not on file   Years of education: Not on file   Highest education level: Not on file  Occupational History   Occupation: retired  Tobacco Use   Smoking status: Former    Types: Cigarettes    Quit date: 1997    Years since quitting: 26.0   Smokeless tobacco: Never  Vaping Use   Vaping Use: Never used  Substance and Sexual Activity   Alcohol use: Never   Drug use: Never   Sexual activity:  Not on file  Other Topics Concern   Not on file  Social History Narrative   Not on file   Social Determinants of Health   Financial Resource Strain: Not on file  Food Insecurity: Not on file  Transportation Needs: Not on file  Physical Activity: Not on file  Stress: Not on file  Social Connections: Not on file  Intimate Partner Violence: Not on file    Family History:   Family History  Problem Relation Age of Onset   Cancer Mother      ROS:  Please see the history of present illness.  All other ROS reviewed and negative.     Physical Exam/Data:   Vitals:   06/15/21 0901 06/15/21 0945 06/15/21 1000 06/15/21 1015  BP:  121/60 (!) 106/53 111/79  Pulse:  67 69 69  Resp:  13 18 (!) 22  Temp:      TempSrc:      SpO2: 100% 100% 100% 94%   No intake or output data in the 24 hours ending 06/15/21 1045 Last 3 Weights 06/14/2021 06/13/2021 06/13/2021  Weight (lbs) 259 lb 4.2 oz 262 lb 5.6 oz 260 lb 9.3 oz  Weight (kg) 117.6 kg 119 kg 118.2 kg     There is no height or weight on file to calculate BMI.  General:  Well nourished, well developed, in no acute distress HEENT: normal Neck: no JVD seen, difficult to assess secondary to body habitus Vascular: No carotid bruits; Distal pulses 1-2+ bilaterally Cardiac:  normal S1, S2; irregular rate and rhythm; no murmur  Lungs: Decreased breath sounds bases with some rales to auscultation bilaterally, no wheezing, rhonchi  Abd: soft, nontender, no hepatomegaly  Ext: no edema Musculoskeletal:  No deformities, BUE and BLE strength weak but equal Skin: warm and dry  Neuro:  CNs 2-12 intact, no focal abnormalities noted Psych:  Normal affect   EKG:  The EKG was personally reviewed and demonstrates:  Afib, HR 70, +artifact Telemetry:  Telemetry was personally reviewed and demonstrates: Atrial fibrillation, controlled rate  Relevant CV Studies:  ECHO: 06/01/2021  1. Left ventricular ejection fraction, by estimation, is 60 to 65%. The   left ventricle has normal function. The left ventricle has no regional  wall motion abnormalities. There is mild left ventricular hypertrophy.  Left ventricular diastolic parameters were normal.   2. Right ventricular systolic function is  normal. The right ventricular  size is normal. There is moderately elevated pulmonary artery systolic pressure.   3. Left atrial size was moderately dilated.   4. There is significant annular calcification and  calcification/thickening of mitrla leaflets PISA radius 1.1 with PISA ERO 55 mm 2 suggest severe MR Consider TEE if clinically indicated when acute COVID respiratory precautions done . The mitral valve is abnormal. Severe mitral valve regurgitation. No evidence of mitral stenosis. Moderate mitral annular calcification.   5. The aortic valve is tricuspid. There is moderate calcification of the  aortic valve. There is moderate thickening of the aortic valve. Aortic  valve regurgitation is mild. Aortic valve sclerosis/calcification is  present, without any evidence of aortic stenosis.   6. Aortic dilatation noted. There is moderate dilatation of the ascending aorta, measuring 43 mm.   7. The inferior vena cava is normal in size with greater than 50%  respiratory variability, suggesting right atrial pressure of 3 mmHg.   Laboratory Data:  High Sensitivity Troponin:   Recent Labs  Lab 05/31/21 1700 05/31/21 1851 06/04/21 1040 06/15/21 0833  TROPONINIHS 707* 660* 132* 1,178*     Chemistry Recent Labs  Lab 06/09/21 0343 06/10/21 0451 06/13/21 0324 06/14/21 0206 06/15/21 0833 06/15/21 0843  NA 132* 137 126* 128* 125* 124*  K 5.2* 4.1 4.0 3.7 5.6* 5.8*  CL 95* 98 90* 93* 87*  --   CO2 20* _0 20*  --   GLUCOSE 135* 69* 177* 185* 208*  --   BUN 134* 86* 87* 72* 96*  --   CREATININE 8.88* 6.67* 6.72* 5.87* 7.21*  --   CALCIUM 7.4* 7.7* 6.7* 6.8* 7.3*  --   MG 2.7* 2.6*  --   --   --   --   GFRNONAA 6* 9* 8* 10* 8*  --   ANIONGAP 17*  _1 18*  --     Recent Labs  Lab 06/09/21 0343 06/15/21 0833  PROT 5.9* 6.7  ALBUMIN 1.8* 2.0*  AST 30 27  ALT 28 5  ALKPHOS 68 89  BILITOT 0.8 0.8   Lipids No results for input(s): CHOL, TRIG, HDL, LABVLDL, LDLCALC, CHOLHDL in the last 168 hours.  Hematology Recent Labs  Lab 06/10/21 0451 06/13/21 2148 06/15/21 0833 06/15/21 0843  WBC 11.7* 9.9 16.7*  --   RBC 2.78* 3.07* 3.07*  --   HGB 8.0* 9.0* 8.6* 8.2*  HCT 25.4* 27.6* 28.1* 24.0*  MCV 91.4 89.9 91.5  --   MCH 28.8 29.3 28.0  --   MCHC 31.5 32.6 30.6  --   RDW 19.3* 17.8* 17.5*  --   PLT 219 221 218  --    Thyroid No results for input(s): TSH, FREET4 in the last 168 hours.  BNP Recent Labs  Lab 06/15/21 0834  BNP 1,728.0*    DDimer No results for input(s): DDIMER in the last 168 hours.   Radiology/Studies:  DG Chest Portable 1 View  Result Date: 06/15/2021 CLINICAL DATA:  66 year old male with history of shortness of breath. EXAM: PORTABLE CHEST 1 VIEW COMPARISON:  Chest x-ray 06/06/2021. FINDINGS: Previously noted endotracheal tube and enteric tube have been removed. Lung volumes are low. Diffuse peribronchial cuffing, widespread interstitial prominence and patchy asymmetrically distributed airspace disease is noted throughout the lungs bilaterally (right greater than left), significantly worsened compared to the prior study. Probable small bilateral pleural effusions. No pneumothorax. Heart size is normal. The patient is rotated to the left on today's  exam, resulting in distortion of the mediastinal contours and reduced diagnostic sensitivity and specificity for mediastinal pathology. Atherosclerotic calcifications are noted in the thoracic aorta. IMPRESSION: 1. Substantially worsened multilobar bilateral pneumonia. 2. Aortic atherosclerosis. Electronically Signed   By: Vinnie Langton M.D.   On: 06/15/2021 08:28     Assessment and Plan:    Elevated troponin - in the setting of COVID PNA and rate  controlled atrial fib - during recent admit for COVID and resp failure, rapid Afib,Trop peak 707 -Denies ischemic symptoms - LV and RV had normal function with no wall motion abnormalities during previous admission, MD advise on repeat - Continue to trend troponins and monitor for symptoms  2. Resp failure - dc 01/13 for same, was extubated 01/09 - he required ETT, blood cx +MSSA -X-ray this admission with worsened Multi lobar bilateral pneumonia - Volume management with dialysis, getting that today   Risk Assessment/Risk Scores:        CHA2DS2-VASc Score = 5   This indicates a 7.2% annual risk of stroke. The patient's score is based upon: CHF History: 1 HTN History: 1 Diabetes History: 1 Stroke History: 0 Vascular Disease History: 1 Age Score: 1 Gender Score: 0  For questions or updates, please contact [facility] Please consult [email or web address] for contact info under    Signed, Rosaria Ferries, PA-C  06/15/2021 10:45 AM   History and all data above reviewed.  Patient examined.  I agree with the findings as above.  The patient is an unfortunate gentleman status close hospitalization for COVID viral pneumonia and septic shock with MSSA bacteremia.  He has atrial flutter rapid rate.  He was just sent out to the nursing home yesterday.  He said he had not been out of bed.  Sounds like he gets around at baseline in his apartment in a wheelchair and seems to have some limited support.  He was only at rehab for the day when he came back in with acute respiratory distress as above.  His atrial fibrillation was rapid rate.  Troponin was elevated.  He denies any chest pressure, neck or arm discomfort.  He has no new shortness of breath, PND or orthopnea.  He has no weight gain or edema.  He is dialysis dependent.  The patient exam reveals XBM:[id number]   ,  Lungs: Clear  ,  Abd: Positive bowel sounds, no rebound no guarding, Ext No edema    .  All available labs, radiology testing,  previous records reviewed. Agree with documented assessment and plan.   Atrial fib: The rate is controlled currently as his hypoxemia has improved.  He has been tolerating anticoagulation.  Elevated troponin: The enzymes are elevated but in keeping with the acute respiratory distress and rapid atrial fibrillation. Given the significant comorbidities I would not suggest that invasive evaluation will be warranted at this point.  I will not plan a repeat echocardiogram.  We will manage this medically.  Richard Hall   3:47 PM  06/15/2021

## 2021-06-16 ENCOUNTER — Inpatient Hospital Stay (HOSPITAL_COMMUNITY): Payer: Medicare Other

## 2021-06-16 DIAGNOSIS — J9621 Acute and chronic respiratory failure with hypoxia: Secondary | ICD-10-CM | POA: Diagnosis not present

## 2021-06-16 LAB — GLUCOSE, CAPILLARY
Glucose-Capillary: 155 mg/dL — ABNORMAL HIGH (ref 70–99)
Glucose-Capillary: 165 mg/dL — ABNORMAL HIGH (ref 70–99)
Glucose-Capillary: 195 mg/dL — ABNORMAL HIGH (ref 70–99)
Glucose-Capillary: 174 mg/dL — ABNORMAL HIGH (ref 70–99)

## 2021-06-16 LAB — RENAL FUNCTION PANEL
Albumin: 2.1 g/dL — ABNORMAL LOW (ref 3.5–5.0)
Anion gap: 13 (ref 5–15)
BUN: 57 mg/dL — ABNORMAL HIGH (ref 8–23)
CO2: 25 mmol/L (ref 22–32)
Calcium: 7.6 mg/dL — ABNORMAL LOW (ref 8.9–10.3)
Chloride: 91 mmol/L — ABNORMAL LOW (ref 98–111)
Creatinine, Ser: 5.36 mg/dL — ABNORMAL HIGH (ref 0.61–1.24)
GFR, Estimated: 11 mL/min — ABNORMAL LOW (ref >60)
Glucose, Bld: 178 mg/dL — ABNORMAL HIGH (ref 70–99)
Phosphorus: 5.9 mg/dL — ABNORMAL HIGH (ref 2.5–4.6)
Potassium: 4 mmol/L (ref 3.5–5.1)
Sodium: 129 mmol/L — ABNORMAL LOW (ref 135–145)

## 2021-06-16 LAB — PROCALCITONIN: Procalcitonin: 4.53 ng/mL

## 2021-06-16 LAB — BRAIN NATRIURETIC PEPTIDE: B Natriuretic Peptide: 2006.2 pg/mL — ABNORMAL HIGH (ref 0.0–100.0)

## 2021-06-16 MED ORDER — METOPROLOL TARTRATE 12.5 MG HALF TABLET
12.5000 mg | ORAL_TABLET | Freq: Two times a day (BID) | ORAL | Status: DC
  Administered 2021-06-17 – 2021-07-02 (×19): 12.5 mg via ORAL
  Filled 2021-06-16 (×27): qty 1

## 2021-06-16 MED ORDER — LACTATED RINGERS IV BOLUS
500.0000 mL | Freq: Once | INTRAVENOUS | Status: AC
  Administered 2021-06-16: 500 mL via INTRAVENOUS

## 2021-06-16 MED ORDER — METOPROLOL TARTRATE 25 MG PO TABS
25.0000 mg | ORAL_TABLET | Freq: Two times a day (BID) | ORAL | Status: DC
  Administered 2021-06-16: 25 mg via ORAL
  Filled 2021-06-16: qty 1

## 2021-06-16 MED ORDER — MIDODRINE HCL 5 MG PO TABS
5.0000 mg | ORAL_TABLET | Freq: Two times a day (BID) | ORAL | Status: DC
  Administered 2021-06-16: 5 mg via ORAL

## 2021-06-16 MED ORDER — IOHEXOL 350 MG/ML SOLN
100.0000 mL | Freq: Once | INTRAVENOUS | Status: AC | PRN
  Administered 2021-06-16: 100 mL via INTRAVENOUS

## 2021-06-16 MED ORDER — MIDODRINE HCL 5 MG PO TABS
2.5000 mg | ORAL_TABLET | Freq: Two times a day (BID) | ORAL | Status: DC
  Administered 2021-06-16: 2.5 mg via ORAL
  Filled 2021-06-16: qty 1

## 2021-06-16 NOTE — Evaluation (Signed)
Physical Therapy Evaluation Patient Details Name: Richard Hall MRN: [id number] DOB: 09/02/1955 Today's Date: 06/16/2021  History of Present Illness  66 y.o. male with medical history significant of hypertension, atrial fibrillation, congestive heart failure, CVA, ESRD on HD, and prostate cancer. Recently d/c from hospital to SNF and readmitted with acute respiratory failure due to overload. On prior admission, found to have chronic non-healed Lt femur facture despite ORIF, will request NWB status LLE.  Clinical Impression  Pt admitted with above diagnosis. Min assist for bed mobility with VC for sequencing to rise to EOB, assist with LLE. Noted on previous admission pt made NWB through LLE due to chronic non-healed femur fracture with ORIF- MD alerted and request order for NWB this admission. Sat EOB without physical support, Max assist to stand with RW and to prevent WB through LLE. Recommended to RN use of sliding board or hoyer at this time. VSS during treatment. Pt currently with functional limitations due to the deficits listed below (see PT Problem List). Pt will benefit from skilled PT to increase their independence and safety with mobility to allow discharge to the venue listed below.          Recommendations for follow up therapy are one component of a multi-disciplinary discharge planning process, led by the attending physician.  Recommendations may be updated based on patient status, additional functional criteria and insurance authorization.  Follow Up Recommendations Skilled nursing-short term rehab (<3 hours/day)    Assistance Recommended at Discharge Frequent or constant Supervision/Assistance  Patient can return home with the following  Two people to help with walking and/or transfers;Two people to help with bathing/dressing/bathroom;Assistance with cooking/housework;Direct supervision/assist for medications management;Direct supervision/assist for financial management;Assist for  transportation;Help with stairs or ramp for entrance    Equipment Recommendations None recommended by PT (TBD next venue of care)  Recommendations for Other Services       Functional Status Assessment Patient has had a recent decline in their functional status and/or demonstrates limited ability to make significant improvements in function in a reasonable and predictable amount of time     Precautions / Restrictions Precautions Precautions: Fall Precaution Comments: intubated Restrictions Weight Bearing Restrictions: Yes LLE Weight Bearing: Non weight bearing      Mobility  Bed Mobility Overal bed mobility: Needs Assistance Bed Mobility: Rolling;Supine to Sit Rolling: Min assist   Supine to sit: Min assist     General bed mobility comments: Min assist for reaching to rail in bed and rolling onto Rt side with cues for technique and sequencing. Min assist for LLE support to bring out of bed.    Transfers Overall transfer level: Needs assistance Equipment used: Rolling walker (2 wheels) (Beri stedy) Transfers: Sit to/from Stand Sit to Stand: From elevated surface;Max assist           General transfer comment: Sit to stand tolerated x2 from EOB Max assist for boost and Mod assist with Rt weight shift and UE support to maintain NWB through LLE while standing with RW. Unable to pivot transfer safely at this time. Utilized beri stedy for transfer protecting LLE and using Rt knee against pad for leverage. Transfer via Lift Equipment: Stedy  Ambulation/Gait               General Gait Details: Unable  Stairs            Wheelchair Mobility    Modified Rankin (Stroke Patients Only)       Balance Overall balance assessment: Needs  assistance Sitting-balance support: Feet supported;Single extremity supported Sitting balance-Leahy Scale: Poor Sitting balance - Comments: Difficulty standing upright fully and needs Mod assist to stand with weight off of LLE      Standing balance-Leahy Scale: Zero Standing balance comment: Unable                             Pertinent Vitals/Pain Pain Assessment: 0-10 Pain Score: 2  Pain Location: Lt thigh Pain Descriptors / Indicators: Aching Pain Intervention(s): Limited activity within patient's tolerance;Monitored during session;Repositioned    Home Living Family/patient expects to be discharged to:: Skilled nursing facility Living Arrangements: Alone Available Help at Discharge: Available PRN/intermittently Type of Home: Apartment Home Access: Level entry       Home Layout: One level Home Equipment: Cane - single Barista (2 wheels);Tub bench Additional Comments: Recently admitted from SNF    Prior Function Prior Level of Function : Needs assist       Physical Assist : Mobility (physical);ADLs (physical) Mobility (physical): Transfers;Bed mobility ADLs (physical): Bathing;Dressing;Toileting Mobility Comments: Pt was d/c to snf after last visit but quickly readmitted       Hand Dominance   Dominant Hand: Right    Extremity/Trunk Assessment   Upper Extremity Assessment Upper Extremity Assessment: Defer to OT evaluation    Lower Extremity Assessment Lower Extremity Assessment: Generalized weakness;LLE deficits/detail LLE Deficits / Details: Significant weakness - noted on last admission to have chronic fx Lt femur       Communication   Communication: No difficulties  Cognition Arousal/Alertness: Awake/alert Behavior During Therapy: Flat affect Overall Cognitive Status: No family/caregiver present to determine baseline cognitive functioning                                 General Comments: Answered questions appropriately, a little confused about sequence of events leading up to d/c and readmission        General Comments General comments (skin integrity, edema, etc.): Pierce on side of face when PT entered room, SpO2 89%, adjusted placement  and quickly rose to 98% on 4L supplemental O2.    Exercises     Assessment/Plan    PT Assessment Patient needs continued PT services  PT Problem List Decreased strength;Decreased range of motion;Decreased balance;Decreased mobility;Decreased cognition;Cardiopulmonary status limiting activity       PT Treatment Interventions DME instruction;Gait training;Functional mobility training;Therapeutic activities;Therapeutic exercise;Balance training;Patient/family education;Neuromuscular re-education    PT Goals (Current goals can be found in the Care Plan section)  Acute Rehab PT Goals Patient Stated Goal: Get well PT Goal Formulation: With patient Time For Goal Achievement: 06/30/21 Potential to Achieve Goals: Fair    Frequency Min 2X/week     Co-evaluation               AM-PAC PT "6 Clicks" Mobility  Outcome Measure Help needed turning from your back to your side while in a flat bed without using bedrails?: A Lot Help needed moving from lying on your back to sitting on the side of a flat bed without using bedrails?: A Lot Help needed moving to and from a bed to a chair (including a wheelchair)?: Total Help needed standing up from a chair using your arms (e.g., wheelchair or bedside chair)?: Total Help needed to walk in hospital room?: Total Help needed climbing 3-5 steps with a railing? : Total 6 Click Score: 8  End of Session Equipment Utilized During Treatment: Oxygen Activity Tolerance: Patient tolerated treatment well Patient left: with call bell/phone within reach;in chair;with chair alarm set Nurse Communication: Mobility status;Need for lift equipment;Precautions;Weight bearing status (use of sliding board or hoyer; NWB status requested.) PT Visit Diagnosis: Other abnormalities of gait and mobility (R26.89);Muscle weakness (generalized) (M62.81);Difficulty in walking, not elsewhere classified (R26.2)    Time: 5631-4970 PT Time Calculation (min) (ACUTE ONLY): 32  min   Charges:   PT Evaluation $PT Eval Moderate Complexity: 1 Mod PT Treatments $Therapeutic Activity: 8-22 mins        Candie Mile, PT, DPT  Ellouise Newer 06/16/2021, 11:03 AM

## 2021-06-16 NOTE — Progress Notes (Signed)
Progress Note  Patient Name: Richard Hall Date of Encounter: 06/16/2021  Primary Cardiologist:   None   Subjective   He feels much better today.  No pain.  Breathing better.  He does not feel his atrial fib.   Inpatient Medications    Scheduled Meds:  amitriptyline  100 mg Oral QHS   apixaban  2.5 mg Oral BID   atorvastatin  80 mg Oral QPM   chlorhexidine  15 mL Mouth Rinse BID   Chlorhexidine Gluconate Cloth  6 each Topical Q0600   enzalutamide  160 mg Oral QHS   insulin aspart  0-6 Units Subcutaneous TID WC   insulin glargine-yfgn  10 Units Subcutaneous QHS   levothyroxine  100 mcg Oral QAC breakfast   [START ON 06/17/2021] lidocaine-prilocaine  1 application Topical Q M,W,F   mouth rinse  15 mL Mouth Rinse q12n4p   metoprolol tartrate  25 mg Oral BID   midodrine  2.5 mg Oral BID WC   multivitamin with minerals  1 tablet Oral Daily   sertraline  100 mg Oral Daily   Continuous Infusions:  [START ON 06/17/2021]  ceFAZolin (ANCEF) IV     PRN Meds: acetaminophen **OR** acetaminophen, albuterol, diphenoxylate-atropine, guaiFENesin   Vital Signs    Vitals:   06/16/21 0319 06/16/21 0500 06/16/21 0907 06/16/21 1209  BP: (!) 158/92  (!) 143/69 116/73  Pulse: 70  70 69  Resp: 20  19 18   Temp: 97.6 F (36.4 C)  97.6 F (36.4 C) 97.6 F (36.4 C)  TempSrc: Oral  Oral Oral  SpO2: 90%  (!) 89% 99%  Weight:  117.4 kg    Height:        Intake/Output Summary (Last 24 hours) at 06/16/2021 1240 Last data filed at 06/16/2021 0240 Gross per 24 hour  Intake 725.41 ml  Output 3000 ml  Net -2274.59 ml   Filed Weights   06/15/21 1523 06/16/21 0500  Weight: 118 kg 117.4 kg    Telemetry    Atrial fib with controlled ventricular rate - Personally Reviewed  ECG    NA - Personally Reviewed  Physical Exam   GEN: No acute distress.   Neck: No  JVD Cardiac: RRR, no murmurs, rubs, or gallops.  (Difficult exam) Respiratory: Clear to auscultation bilaterally. GI: Soft,  nontender, non-distended  MS: No  edema; No deformity. Neuro:  Nonfocal  Psych: Normal affect   Labs    Chemistry Recent Labs  Lab 06/14/21 0206 06/15/21 0833 06/15/21 0843 06/16/21 0105  NA 128* 125* 124* 129*  K 3.7 5.6* 5.8* 4.0  CL 93* 87*  --  91*  CO2 26 20*  --  25  GLUCOSE 185* 208*  --  178*  BUN 72* 96*  --  57*  CREATININE 5.87* 7.21*  --  5.36*  CALCIUM 6.8* 7.3*  --  7.6*  PROT  --  6.7  --   --   ALBUMIN  --  2.0*  --  2.1*  AST  --  27  --   --   ALT  --  5  --   --   ALKPHOS  --  89  --   --   BILITOT  --  0.8  --   --   GFRNONAA 10* 8*  --  11*  ANIONGAP 9 18*  --  13     Hematology Recent Labs  Lab 06/10/21 0451 06/13/21 2148 06/15/21 0833 06/15/21 0843  WBC 11.7* 9.9  16.7*  --   RBC 2.78* 3.07* 3.07*  --   HGB 8.0* 9.0* 8.6* 8.2*  HCT 25.4* 27.6* 28.1* 24.0*  MCV 91.4 89.9 91.5  --   MCH 28.8 29.3 28.0  --   MCHC 31.5 32.6 30.6  --   RDW 19.3* 17.8* 17.5*  --   PLT 219 221 218  --     Cardiac EnzymesNo results for input(s): TROPONINI in the last 168 hours. No results for input(s): TROPIPOC in the last 168 hours.   BNP Recent Labs  Lab 06/15/21 0834 06/16/21 0105  BNP 1,728.0* 2,006.2*     DDimer No results for input(s): DDIMER in the last 168 hours.   Radiology    CT CHEST WO CONTRAST  Result Date: 06/16/2021 CLINICAL DATA:  66 year old male with history of pneumonia. EXAM: CT CHEST WITHOUT CONTRAST TECHNIQUE: Multidetector CT imaging of the chest was performed following the standard protocol without IV contrast. RADIATION DOSE REDUCTION: This exam was performed according to the departmental dose-optimization program which includes automated exposure control, adjustment of the mA and/or kV according to patient size and/or use of iterative reconstruction technique. COMPARISON:  Chest CT 09/23/2017. FINDINGS: Cardiovascular: Heart size is mildly enlarged. There is no significant pericardial fluid, thickening or pericardial  calcification. There is aortic atherosclerosis, as well as atherosclerosis of the great vessels of the mediastinum and the coronary arteries, including calcified atherosclerotic plaque in the left main, left anterior descending, left circumflex and right coronary arteries. Calcifications of the aortic valve. Left atrial appendage occluding device noted. Mediastinum/Nodes: Mildly enlarged low right paratracheal lymph node measuring 1.7 cm in short axis (axial image 44 of series 3). No other pathologically enlarged mediastinal or hilar lymph nodes are confidently identified on today's noncontrast CT examination. Esophagus is unremarkable in appearance. No axillary lymphadenopathy. Lungs/Pleura: Diffuse ground-glass attenuation and mild interlobular septal thickening noted throughout the lungs bilaterally, suggesting a background of pulmonary edema. Moderate bilateral pleural effusions lying dependently with associated areas of passive subsegmental atelectasis in the lower lobes of the lungs bilaterally. There is a new thick-walled cavitary nodule in the right upper lobe (axial image 61 of series 4) measuring 1.4 x 1.2 cm. Upper Abdomen: Aortic atherosclerosis. Mild bilateral renal atrophy. Musculoskeletal: Diffuse sclerotic lesions noted throughout the visualized axial and appendicular skeleton, compatible with widespread metastatic disease in this patient with documented history of prostate cancer. IMPRESSION: 1. The appearance of the chest suggest congestive heart failure, as above. 2. In addition, there is a new thick-walled cavitary nodule measuring 1.4 x 1.2 cm in the right upper lobe. This is potentially infectious or inflammatory in etiology, however, underlying neoplasm is not excluded. Short-term follow-up repeat noncontrast chest CT is recommended in 2-3 months to ensure the stability or regression of this lesion. 3. Mildly enlarged right paratracheal lymph node, nonspecific in the setting of presumed  congestive heart failure, but concerning in light of the right upper lobe pulmonary nodule. Attention at time of repeat CT examination is recommended to ensure stability/regression. 4. Diffuse sclerotic lesions noted throughout the visualized axial and appendicular skeleton, presumably reflective of widespread osseous metastasis in this patient with history of prostate cancer. 5. Aortic atherosclerosis, in addition to left main and three-vessel coronary artery disease. Please note that although the presence of coronary artery calcium documents the presence of coronary artery disease, the severity of this disease and any potential stenosis cannot be assessed on this non-gated CT examination. Assessment for potential risk factor modification, dietary therapy or pharmacologic therapy  may be warranted, if clinically indicated. 6. There are calcifications of the aortic valve. Echocardiographic correlation for evaluation of potential valvular dysfunction may be warranted if clinically indicated. Aortic Atherosclerosis (ICD10-I70.0). Electronically Signed   By: Vinnie Langton M.D.   On: 06/16/2021 11:59   DG CHEST PORT 1 VIEW  Result Date: 06/16/2021 CLINICAL DATA:  Shortness of breath EXAM: PORTABLE CHEST 1 VIEW COMPARISON:  06/15/2021 FINDINGS: Cardiac shadow is enlarged. Aortic calcifications are again seen. Lungs are well aerated bilaterally. Persistent airspace opacity is again seen primarily within the right lung base similar to that seen on the prior exam. No bony abnormality is noted. IMPRESSION: Significant interval change from the prior study. Electronically Signed   By: Inez Catalina M.D.   On: 06/16/2021 03:59   DG Chest Portable 1 View  Result Date: 06/15/2021 CLINICAL DATA:  66 year old male with history of shortness of breath. EXAM: PORTABLE CHEST 1 VIEW COMPARISON:  Chest x-ray 06/06/2021. FINDINGS: Previously noted endotracheal tube and enteric tube have been removed. Lung volumes are low.  Diffuse peribronchial cuffing, widespread interstitial prominence and patchy asymmetrically distributed airspace disease is noted throughout the lungs bilaterally (right greater than left), significantly worsened compared to the prior study. Probable small bilateral pleural effusions. No pneumothorax. Heart size is normal. The patient is rotated to the left on today's exam, resulting in distortion of the mediastinal contours and reduced diagnostic sensitivity and specificity for mediastinal pathology. Atherosclerotic calcifications are noted in the thoracic aorta. IMPRESSION: 1. Substantially worsened multilobar bilateral pneumonia. 2. Aortic atherosclerosis. Electronically Signed   By: Vinnie Langton M.D.   On: 06/15/2021 08:28    Cardiac Studies   NA  Patient Profile     66 y.o. male with a hx of paroxysmal afib anticoagulated on Eliquis, chronic diastolic CHF on home O2 2 lpm, HTN, prior CVA, ESRD on HD, recurrent GIB, and chronic anemia getting iron infusions, Watchman device implanted 04/18/2021, who is being seen 06/15/2021 for the evaluation of atrial fibrillation and CHF at the request of Dr. Tamala Julian.    Assessment & Plan   ELEVATED TROPONIN:   He does have three vessel coronary calcium but is not a candidate for invasive evaluation.  No symptoms.  No change in therapy.   RESPIRATORY FAILURE:   Multifactorial with some element of acute on chronic diastolic dysfunction with severe MR contributing.  Volume management per renal.  He has a new cavitary nodule as well.   Volume management per dialysis.     ATRIAL FIB:  Rate is controlled.  On Eliquis.   No change in therapy.,  We will follow as needed.    For questions or updates, please contact [place] Please consult [email or web address] for contact info under Cardiology/STEMI.   Signed, Minus Breeding, MD  06/16/2021, 12:40 PM

## 2021-06-16 NOTE — Progress Notes (Signed)
PROGRESS NOTE                                                                                                                                                                                                             Patient Demographics:    Richard Hall, is a 66 y.o. male, DOB - April 21, 1956, ZYS:[id number]  Outpatient Primary MD for the patient is [name], Ria Bush, MD    LOS - 1  Admit date - 06/15/2021    Chief Complaint  Patient presents with   Shortness of Breath       Brief Narrative (HPI from H&P)    Kohle Winner is a 66 y.o. male with medical history significant of hypertension, atrial fibrillation, congestive heart failure, CVA, ESRD on HD, and prostate cancer presents with complaints of shortness of breath.  Patient had just recently been hospitalized from 12/30-1/13 after initially presenting with septic shock related to MSSA bacteremia and COVID-19 viral pneumonia.  During his hospital stay patient required intubation.  ID had been consulted and patient was recommended to complete 6 weeks of cefazolin to be completed on 06/20/2021.  He went to the rehab facility but soon was sent back to the hospital for hypoxia, in the ER his work-up was suggestive of possible continued pneumonia, troponin, cardiology was consulted and he was admitted for further care.   Subjective:    Wilfrid Lund today has, No headache, No chest pain, No abdominal pain - No Nausea, No new weakness tingling or numbness, mild cough but improved shortness of breath.   Assessment  & Plan :   Acute Hypoxic Resp. Failure due to Acute on chronic diastolic CHF in the presence of severe MR, EF 60% on recent echo with NSTEMI in a patient with ESRD and recent COVID-19 pneumonia -no sepsis this admission, leukocytosis likely due to the stress of hypoxic respiratory failure, underwent HD on 06/15/2021 with fluid removal and improvement in hypoxia.  Continue to  monitor cultures.  In the light of recent pneumonia with MSSA bacteremia during last hospitalization we will check CT scan of the chest to rule out any gross infectious etiology.  Will trend and monitor procalcitonin and CRP.  Encouraged to sit up in chair in the daytime use I-S and flutter valve for pulmonary toiletry.  Will advance activity and titrate down oxygen as  possible.  2.  ESRD.  Renal on board for fluid removal through HD.  3.  Recent MSSA bacteremia.  On cefazolin stop date is 06/20/2021.  4.  Dysphagia.  Speech therapy following currently on dysphagia 2 diet.  5.  Hypotension.  Blood pressure has stabilized and improving titrate off midodrine.  6.  Obesity with OSA.  BMI 35 follow with PCP for weight loss nightly CPAP.  Counseled on compliance  7.  Recent left ankle fracture.  Nonweightbearing left leg per last admission, outpatient orthopedics follow-up.  PT OT.  8.  Hypothyroidism.  On Synthroid   9.  Chronic A. fib [place] vas 2 score of greater than 4.  On Eliquis continue low-dose beta-blocker as tolerated by blood pressure.    10.  AOCD.  Monitor.  11.  Dyslipidemia.  On Synthroid continue  12. History of prostate cancer - Continue Xtandi.  13.  Elevated troponin.  Likely demand ischemia in the setting of ESRD, demand mismatch caused by hypoxia from #1 above, chest pain-free.  Cardiology on board, on Eliquis, statin, low-dose beta-blocker as tolerated by blood pressure and monitor  14. DM type II.  On Lantus and sliding scale.  Monitor and adjust  Lab Results  Component Value Date   HGBA1C 6.1 (H) 06/01/2021    CBG (last 3)  Recent Labs    06/14/21 1124 06/15/21 1954 06/16/21 0905  GLUCAP 150* 156* 155*           Condition - Extremely Guarded  Family Communication  : None present  Code Status : Full code  Consults  : Cardiology  PUD Prophylaxis :    Procedures  :     CT scan -       Disposition Plan  :    Status is: Inpatient  Remains  inpatient appropriate because: Acute hypoxic respiratory failure  DVT Prophylaxis  :    apixaban (ELIQUIS) tablet 2.5 mg Start: 06/15/21 2200 apixaban (ELIQUIS) tablet 2.5 mg     Lab Results  Component Value Date   PLT 218 06/15/2021    Diet :  Diet Order             DIET DYS 2 Room service appropriate? Yes; Fluid consistency: Thin  Diet effective now                    Inpatient Medications  Scheduled Meds:  amitriptyline  100 mg Oral QHS   apixaban  2.5 mg Oral BID   atorvastatin  80 mg Oral QPM   chlorhexidine  15 mL Mouth Rinse BID   Chlorhexidine Gluconate Cloth  6 each Topical Q0600   enzalutamide  160 mg Oral QHS   insulin aspart  0-6 Units Subcutaneous TID WC   insulin glargine-yfgn  10 Units Subcutaneous QHS   levothyroxine  100 mcg Oral QAC breakfast   [START ON 06/17/2021] lidocaine-prilocaine  1 application Topical Q M,W,F   mouth rinse  15 mL Mouth Rinse q12n4p   midodrine  10 mg Oral TID WC   multivitamin with minerals  1 tablet Oral Daily   sertraline  100 mg Oral Daily   Continuous Infusions:  [START ON 06/17/2021]  ceFAZolin (ANCEF) IV     PRN Meds:.acetaminophen **OR** acetaminophen, albuterol, diphenoxylate-atropine, guaiFENesin     Time Spent in minutes  30   Lala Lund M.D on 06/16/2021 at 10:44 AM  To page go to [email or web address]   [facility] -  Office  [phone]  See  all Orders from today for further details    Objective:   Vitals:   06/16/21 0000 06/16/21 0319 06/16/21 0500 06/16/21 0907  BP: 106/61 (!) 158/92  (!) 143/69  Pulse: 71 70  70  Resp: 19 20  19   Temp: 98.1 F (36.7 C) 97.6 F (36.4 C)  97.6 F (36.4 C)  TempSrc: Axillary Oral  Oral  SpO2: 95% 90%  (!) 89%  Weight:   117.4 kg   Height:        Wt Readings from Last 3 Encounters:  06/16/21 117.4 kg  06/14/21 117.6 kg  07/26/20 125.3 kg     Intake/Output Summary (Last 24 hours) at 06/16/2021 1044 Last data filed at 06/16/2021 0240 Gross per  24 hour  Intake 825.41 ml  Output 3000 ml  Net -2174.59 ml     Physical Exam  Awake Alert, No new F.N deficits, Normal affect [place].AT,PERRAL Supple Neck, No JVD,   Symmetrical Chest wall movement, Good air movement bilaterally, CTAB RRR,No Gallops,Rubs or new Murmurs,  +ve B.Sounds, Abd Soft, No tenderness,   No Cyanosis, Clubbing or edema     RN pressure injury documentation: Pressure Injury 06/11/21 Coccyx Medial Stage 2 -  Partial thickness loss of dermis presenting as a shallow open injury with a red, pink wound bed without slough. (Active)  06/11/21 2159  Location: Coccyx  Location Orientation: Medial  Staging: Stage 2 -  Partial thickness loss of dermis presenting as a shallow open injury with a red, pink wound bed without slough.  Wound Description (Comments):   Present on Admission:      Data Review:    CBC Recent Labs  Lab 06/10/21 0451 06/13/21 2148 06/15/21 0833 06/15/21 0843  WBC 11.7* 9.9 16.7*  --   HGB 8.0* 9.0* 8.6* 8.2*  HCT 25.4* 27.6* 28.1* 24.0*  PLT 219 221 218  --   MCV 91.4 89.9 91.5  --   MCH 28.8 29.3 28.0  --   MCHC 31.5 32.6 30.6  --   RDW 19.3* 17.8* 17.5*  --   LYMPHSABS  --   --  0.3*  --   MONOABS  --   --  0.7  --   EOSABS  --   --  0.0  --   BASOSABS  --   --  0.0  --     Electrolytes Recent Labs  Lab 06/10/21 0451 06/13/21 0324 06/14/21 0206 06/15/21 0833 06/15/21 0834 06/15/21 0839 06/15/21 0843 06/16/21 0105  NA 137 126* 128* 125*  --   --  124* 129*  K 4.1 4.0 3.7 5.6*  --   --  5.8* 4.0  CL 98 90* 93* 87*  --   --   --  91*  CO2 25 23 26  20*  --   --   --  25  GLUCOSE 69* 177* 185* 208*  --   --   --  178*  BUN 86* 87* 72* 96*  --   --   --  57*  CREATININE 6.67* 6.72* 5.87* 7.21*  --   --   --  5.36*  CALCIUM 7.7* 6.7* 6.8* 7.3*  --   --   --  7.6*  AST  --   --   --  27  --   --   --   --   ALT  --   --   --  5  --   --   --   --  ALKPHOS  --   --   --  64  --   --   --   --   BILITOT  --   --   --  0.8   --   --   --   --   ALBUMIN  --   --   --  2.0*  --   --   --  2.1*  MG 2.6*  --   --   --   --   --   --   --   PROCALCITON  --   --   --   --   --   --   --  4.53  LATICACIDVEN  --   --   --   --   --  1.2  --   --   BNP  --   --   --   --  1,728.0*  --   --  2,006.2*    ------------------------------------------------------------------------------------------------------------------ No results for input(s): CHOL, HDL, LDLCALC, TRIG, CHOLHDL, LDLDIRECT in the last 72 hours.  Lab Results  Component Value Date   HGBA1C 6.1 (H) 06/01/2021    No results for input(s): TSH, T4TOTAL, T3FREE, THYROIDAB in the last 72 hours.  Invalid input(s): FREET3 ------------------------------------------------------------------------------------------------------------------ ID Labs Recent Labs  Lab 06/10/21 0451 06/13/21 0324 06/13/21 2148 06/14/21 0206 06/15/21 0833 06/15/21 0839 06/16/21 0105  WBC 11.7*  --  9.9  --  16.7*  --   --   PLT 219  --  221  --  218  --   --   PROCALCITON  --   --   --   --   --   --  4.53  LATICACIDVEN  --   --   --   --   --  1.2  --   CREATININE 6.67* 6.72*  --  5.87* 7.21*  --  5.36*   Cardiac Enzymes No results for input(s): CKMB, TROPONINI, MYOGLOBIN in the last 168 hours.  Invalid input(s): CK  Radiology Reports DG CHEST PORT 1 VIEW  Result Date: 06/16/2021 CLINICAL DATA:  Shortness of breath EXAM: PORTABLE CHEST 1 VIEW COMPARISON:  06/15/2021 FINDINGS: Cardiac shadow is enlarged. Aortic calcifications are again seen. Lungs are well aerated bilaterally. Persistent airspace opacity is again seen primarily within the right lung base similar to that seen on the prior exam. No bony abnormality is noted. IMPRESSION: Significant interval change from the prior study. Electronically Signed   By: Inez Catalina M.D.   On: 06/16/2021 03:59   DG Chest Portable 1 View  Result Date: 06/15/2021 CLINICAL DATA:  66 year old male with history of shortness of breath.  EXAM: PORTABLE CHEST 1 VIEW COMPARISON:  Chest x-ray 06/06/2021. FINDINGS: Previously noted endotracheal tube and enteric tube have been removed. Lung volumes are low. Diffuse peribronchial cuffing, widespread interstitial prominence and patchy asymmetrically distributed airspace disease is noted throughout the lungs bilaterally (right greater than left), significantly worsened compared to the prior study. Probable small bilateral pleural effusions. No pneumothorax. Heart size is normal. The patient is rotated to the left on today's exam, resulting in distortion of the mediastinal contours and reduced diagnostic sensitivity and specificity for mediastinal pathology. Atherosclerotic calcifications are noted in the thoracic aorta. IMPRESSION: 1. Substantially worsened multilobar bilateral pneumonia. 2. Aortic atherosclerosis. Electronically Signed   By: Vinnie Langton M.D.   On: 06/15/2021 08:28

## 2021-06-16 NOTE — Progress Notes (Signed)
Subjective: Sitting up in bedside chair denies dyspnea, tolerated dialysis yest.  3 L UF  Objective Vital signs in last 24 hours: Vitals:   06/16/21 0319 06/16/21 0500 06/16/21 0907 06/16/21 1209  BP: (!) 158/92  (!) 143/69 116/73  Pulse: 70  70 69  Resp: 20  19 18   Temp: 97.6 F (36.4 C)  97.6 F (36.4 C) 97.6 F (36.4 C)  TempSrc: Oral  Oral Oral  SpO2: 90%  (!) 89% 99%  Weight:  117.4 kg    Height:       Weight change:   Physical Exam: General: Chronically ill male appears older than stated age NAD Heart: Occasionally irregular, pulse 70s, no MRG Lungs: Decreased breath sounds bases otherwise CTA nonlabored breathing Abdomen: Obese NABS, S NT ND Extremities: 1+ pedal edema and edema  Dialysis Access: Positive bruit left arm AV fistula with bruising nontender  AOP HD: [facility] MWF  4h  450 bfr  123kg  2/2 bath  LU AVF   Hep none  - mircera 225 mcg every 2 weeks - last received 200 mcg on 12/14  - venofer 100 mg (ordered for 10 doses)  - no vdra    Problem/Plan: AHRF - CXR pulm edema on admit  sig edema in LE's on exam.  HD 3 L UF last night SOB resolved, next dialysis tomorrow schedule /noted wts 117.4 today but he stated bed weights need standing weight tomorrow  Recent COVID pna - was on vent 1/05 to 1/08 ESRD - HD MWF.  HD tomor.Monday 1/16 on schedule BP/ volume - BP's soft, this a.m. improved 116/73, he is on low-dose 2.5 mg midodrine. Left here about 5-7kg under dry wt, weight is above he said he can stand tomorrow with dialysis Anemia ckd - was on darbe 200 mcg/wk while here, got prbcs also.  MBD ckd - cont auryxia ac.  Atrial Fibrillation w/ RVR - S/p amio, Eliquis beta-blocker as tolerated BP, heart rate improved this a.m. Patient MSSA bacteremia= noted per admit on cefazolin stop date 06/20/2021 Recent left ankle fracture=.  Follow-up, "none weightbearing left leg per last admit"  Labs: Basic Metabolic Panel: Recent Labs  Lab 06/14/21 0206 06/15/21 0833  06/15/21 0843 06/16/21 0105  NA 128* 125* 124* 129*  K 3.7 5.6* 5.8* 4.0  CL 93* 87*  --  91*  CO2 26 20*  --  25  GLUCOSE 185* 208*  --  178*  BUN 72* 96*  --  57*  CREATININE 5.87* 7.21*  --  5.36*  CALCIUM 6.8* 7.3*  --  7.6*  PHOS  --   --   --  5.9*   Liver Function Tests: Recent Labs  Lab 06/15/21 0833 06/16/21 0105  AST 27  --   ALT 5  --   ALKPHOS 89  --   BILITOT 0.8  --   PROT 6.7  --   ALBUMIN 2.0* 2.1*   No results for input(s): LIPASE, AMYLASE in the last 168 hours. No results for input(s): AMMONIA in the last 168 hours. CBC: Recent Labs  Lab 06/10/21 0451 06/13/21 2148 06/15/21 0833 06/15/21 0843  WBC 11.7* 9.9 16.7*  --   NEUTROABS  --   --  15.6*  --   HGB 8.0* 9.0* 8.6* 8.2*  HCT 25.4* 27.6* 28.1* 24.0*  MCV 91.4 89.9 91.5  --   PLT 219 221 218  --    Cardiac Enzymes: No results for input(s): CKTOTAL, CKMB, CKMBINDEX, TROPONINI in the last  168 hours. CBG: Recent Labs  Lab 06/14/21 0826 06/14/21 1124 06/15/21 1954 06/16/21 0905 06/16/21 1208  GLUCAP 87 150* 156* 155* 165*     Medications:  [START ON 06/17/2021]  ceFAZolin (ANCEF) IV      amitriptyline  100 mg Oral QHS   apixaban  2.5 mg Oral BID   atorvastatin  80 mg Oral QPM   chlorhexidine  15 mL Mouth Rinse BID   Chlorhexidine Gluconate Cloth  6 each Topical Q0600   enzalutamide  160 mg Oral QHS   insulin aspart  0-6 Units Subcutaneous TID WC   insulin glargine-yfgn  10 Units Subcutaneous QHS   levothyroxine  100 mcg Oral QAC breakfast   [START ON 06/17/2021] lidocaine-prilocaine  1 application Topical Q M,W,F   mouth rinse  15 mL Mouth Rinse q12n4p   metoprolol tartrate  25 mg Oral BID   midodrine  2.5 mg Oral BID WC   multivitamin with minerals  1 tablet Oral Daily   sertraline  100 mg Oral Daily

## 2021-06-16 NOTE — Evaluation (Signed)
Clinical/Bedside Swallow Evaluation Patient Details  Name: Richard Hall MRN: [id number] Date of Birth: Feb 20, 1956  Today's Date: 06/16/2021 Time: SLP Start Time (ACUTE ONLY): 34 SLP Stop Time (ACUTE ONLY): 0938 SLP Time Calculation (min) (ACUTE ONLY): 25 min  Past Medical History:  Past Medical History:  Diagnosis Date   A-V fistula ([place])    left arm   Anemia    Arthritis    CHF (congestive heart failure) ([place])    CVA (cerebral vascular accident) ([place]) 2010   Diabetes mellitus with end-stage renal disease ([place])    Dyalisis pt.   Diabetic neuropathy (HCC)    Bilateral feet   Dyslipidemia    Dysrhythmia    A-fib   Essential hypertension    Hepatitis    childhood   History of blood transfusion    Hypothyroidism (acquired)    OSA on CPAP    Prostate cancer [facility])    Uses wheelchair    Past Surgical History:  Past Surgical History:  Procedure Laterality Date   APPENDECTOMY     COLONOSCOPY     FEMUR IM NAIL Left 05/20/2019   Procedure: Intramedullary (Im) Retrograde Femoral Nailing for impending pathologic fracture;  Surgeon: Altamese Franklin, MD;  Location: [place];  Service: Orthopedics;  Laterality: Left;   ORIF FEMUR FRACTURE Left 05/20/2019   Procedure: OPEN REDUCTION INTERNAL FIXATION (ORIF) DISTAL FEMUR FRACTURE with intercondylar extension;  Surgeon: Altamese Toombs, MD;  Location: [place];  Service: Orthopedics;  Laterality: Left;   TRANSURETHRAL RESECTION OF BLADDER TUMOR N/A 01/07/2019   Procedure: CYSTOSCOPY/ EVEACUATION CLOT;  Surgeon: Bjorn Loser, MD;  [facility];  Service: Urology;  Laterality: N/A;   TRANSURETHRAL RESECTION OF PROSTATE N/A 07/26/2020   Procedure: TRANSURETHRAL RESECTION OF THE PROSTATE (TURP);  Surgeon: Alexis Frock, MD;  [facility];  Service: Urology;  Laterality: N/A;  38 MINS   HPI:  66 y.o. male with medical history significant of hypertension, atrial fibrillation, congestive heart failure, CVA, ESRD on HD, and prostate  cancer. Recently d/c (admitted 12/20-1/13)from hospital to SNF and readmitted with acute respiratory failure due to overload. Patient had been seen by ST services during previous admission and at that time was tolerating a Dys 2, thin liquids diet.    Assessment / Plan / Recommendation  Clinical Impression  Patient presents with clinical s/s dysphagia as per this bedside swallow evaluation. As compared to swallow evaluation completed by ST services during 12/30-1/13 admission, patient is appearing improved in comparison. His voice was clear, cough was strong and swallow initiation appeared timely. He did not exhibit any overt s/s aspiration or penetration with thin liquids via straw sips and RR and oxygen saturations both remained WFL. Patient initially seemed to want to try upgraded solids textures, but when introduced, he consumed a small piece of saltine cracker and then stating that "it didnt matter" what texture foods he received. Patient did appear a little confused and fatigued during session and SLP decided to keep patient on Dys 2 solids with potential to trial upgrade solid textures next date. SLP Visit Diagnosis: Dysphagia, unspecified (R13.10)    Aspiration Risk  Mild aspiration risk    Diet Recommendation Dysphagia 2 (Fine chop);Thin liquid   Liquid Administration via: Cup;Straw Medication Administration: Whole meds with puree Supervision: Patient able to self feed Compensations: Slow rate;Small sips/bites Postural Changes: Seated upright at 90 degrees    Other  Recommendations Oral Care Recommendations: Oral care BID;Staff/trained caregiver to provide oral care    Recommendations for  follow up therapy are one component of a multi-disciplinary discharge planning process, led by the attending physician.  Recommendations may be updated based on patient status, additional functional criteria and insurance authorization.  Follow up Recommendations Skilled nursing-short term rehab (<3  hours/day)      Assistance Recommended at Discharge Frequent or constant Supervision/Assistance  Functional Status Assessment Patient has had a recent decline in their functional status and demonstrates the ability to make significant improvements in function in a reasonable and predictable amount of time.  Frequency and Duration min 2x/week  1 week       Prognosis Prognosis for Safe Diet Advancement: Good      Swallow Study   General Date of Onset: 06/15/21 HPI: 66 y.o. male with medical history significant of hypertension, atrial fibrillation, congestive heart failure, CVA, ESRD on HD, and prostate cancer. Recently d/c (admitted 12/20-1/13)from hospital to SNF and readmitted with acute respiratory failure due to overload. Patient had been seen by ST services during previous admission and at that time was tolerating a Dys 2, thin liquids diet. Type of Study: Bedside Swallow Evaluation Previous Swallow Assessment: during recent previous admission (BSE) Diet Prior to this Study: Dysphagia 2 (chopped);Thin liquids Temperature Spikes Noted: No Respiratory Status: Nasal cannula History of Recent Intubation: Yes Length of Intubations (days): 4 days Date extubated: 06/09/21 Behavior/Cognition: Alert;Cooperative;Confused;Lethargic/Drowsy Oral Cavity Assessment: Within Functional Limits Oral Care Completed by SLP: No Oral Cavity - Dentition: Missing dentition;Dentures, not available Vision: Functional for self-feeding Self-Feeding Abilities: Able to feed self Patient Positioning: Upright in bed Baseline Vocal Quality: Normal Volitional Cough: Strong Volitional Swallow: Able to elicit    Oral/Motor/Sensory Function Overall Oral Motor/Sensory Function: Within functional limits   Ice Chips     Thin Liquid Thin Liquid: Within functional limits Presentation: Straw;Self Fed    Nectar Thick     Honey Thick     Puree Puree: Not tested   Solid     Solid: Impaired Oral Phase  Impairments: Impaired mastication Other Comments: prolonged mastication of saltine cracker. Patient did not have dentures in place but reported that he is able to chew solids without them.      Sonia Baller, MA, CCC-SLP Speech Therapy

## 2021-06-16 NOTE — Progress Notes (Signed)
Patient refused use of BIPAP for the evening.  °

## 2021-06-17 DIAGNOSIS — J9621 Acute and chronic respiratory failure with hypoxia: Secondary | ICD-10-CM | POA: Diagnosis not present

## 2021-06-17 LAB — COMPREHENSIVE METABOLIC PANEL WITH GFR
ALT: <5 U/L (ref 0–44)
AST: 17 U/L (ref 15–41)
Albumin: 2.1 g/dL — ABNORMAL LOW (ref 3.5–5.0)
Alkaline Phosphatase: 81 U/L (ref 38–126)
Anion gap: 15 (ref 5–15)
BUN: 72 mg/dL — ABNORMAL HIGH (ref 8–23)
CO2: 24 mmol/L (ref 22–32)
Calcium: 7.6 mg/dL — ABNORMAL LOW (ref 8.9–10.3)
Chloride: 88 mmol/L — ABNORMAL LOW (ref 98–111)
Creatinine, Ser: 6.42 mg/dL — ABNORMAL HIGH (ref 0.61–1.24)
GFR, Estimated: 9 mL/min — ABNORMAL LOW (ref >60)
Glucose, Bld: 161 mg/dL — ABNORMAL HIGH (ref 70–99)
Potassium: 4.1 mmol/L (ref 3.5–5.1)
Sodium: 127 mmol/L — ABNORMAL LOW (ref 135–145)
Total Bilirubin: 0.7 mg/dL (ref 0.3–1.2)
Total Protein: 6.7 g/dL (ref 6.5–8.1)

## 2021-06-17 LAB — CBC WITH DIFFERENTIAL/PLATELET
Abs Immature Granulocytes: 0.04 K/uL (ref 0.00–0.07)
Basophils Absolute: 0 K/uL (ref 0.0–0.1)
Basophils Relative: 0 %
Eosinophils Absolute: 0.1 K/uL (ref 0.0–0.5)
Eosinophils Relative: 2 %
HCT: 24.6 % — ABNORMAL LOW (ref 39.0–52.0)
Hemoglobin: 7.4 g/dL — ABNORMAL LOW (ref 13.0–17.0)
Immature Granulocytes: 1 %
Lymphocytes Relative: 9 %
Lymphs Abs: 0.7 K/uL (ref 0.7–4.0)
MCH: 27.6 pg (ref 26.0–34.0)
MCHC: 30.1 g/dL (ref 30.0–36.0)
MCV: 91.8 fL (ref 80.0–100.0)
Monocytes Absolute: 0.5 K/uL (ref 0.1–1.0)
Monocytes Relative: 7 %
Neutro Abs: 6.2 K/uL (ref 1.7–7.7)
Neutrophils Relative %: 81 %
Platelets: 218 K/uL (ref 150–400)
RBC: 2.68 MIL/uL — ABNORMAL LOW (ref 4.22–5.81)
RDW: 17.3 % — ABNORMAL HIGH (ref 11.5–15.5)
WBC: 7.6 K/uL (ref 4.0–10.5)
nRBC: 0 % (ref 0.0–0.2)

## 2021-06-17 LAB — RENAL FUNCTION PANEL
Albumin: 2.1 g/dL — ABNORMAL LOW (ref 3.5–5.0)
Anion gap: 14 (ref 5–15)
BUN: 71 mg/dL — ABNORMAL HIGH (ref 8–23)
CO2: 25 mmol/L (ref 22–32)
Calcium: 7.7 mg/dL — ABNORMAL LOW (ref 8.9–10.3)
Chloride: 87 mmol/L — ABNORMAL LOW (ref 98–111)
Creatinine, Ser: 6.31 mg/dL — ABNORMAL HIGH (ref 0.61–1.24)
GFR, Estimated: 9 mL/min — ABNORMAL LOW (ref >60)
Glucose, Bld: 160 mg/dL — ABNORMAL HIGH (ref 70–99)
Phosphorus: 6.5 mg/dL — ABNORMAL HIGH (ref 2.5–4.6)
Potassium: 4.1 mmol/L (ref 3.5–5.1)
Sodium: 126 mmol/L — ABNORMAL LOW (ref 135–145)

## 2021-06-17 LAB — GLUCOSE, CAPILLARY
Glucose-Capillary: 179 mg/dL — ABNORMAL HIGH (ref 70–99)
Glucose-Capillary: 157 mg/dL — ABNORMAL HIGH (ref 70–99)
Glucose-Capillary: 133 mg/dL — ABNORMAL HIGH (ref 70–99)

## 2021-06-17 LAB — BRAIN NATRIURETIC PEPTIDE: B Natriuretic Peptide: 2107.4 pg/mL — ABNORMAL HIGH (ref 0.0–100.0)

## 2021-06-17 LAB — C-REACTIVE PROTEIN: CRP: 14.3 mg/dL — ABNORMAL HIGH (ref <1.0)

## 2021-06-17 LAB — MAGNESIUM: Magnesium: 2.4 mg/dL (ref 1.7–2.4)

## 2021-06-17 MED ORDER — ONDANSETRON HCL 4 MG/2ML IJ SOLN: 4.0000 mg | Freq: Four times a day (QID) | INTRAMUSCULAR | Status: DC | PRN

## 2021-06-17 MED ORDER — SODIUM CHLORIDE 0.9 % IV SOLN
3.0000 g | INTRAVENOUS | Status: DC
  Administered 2021-06-17 – 2021-06-20 (×4): 3 g via INTRAVENOUS
  Filled 2021-06-17 (×4): qty 8

## 2021-06-17 MED ORDER — DARBEPOETIN ALFA 200 MCG/0.4ML IJ SOSY
200.0000 ug | PREFILLED_SYRINGE | INTRAMUSCULAR | Status: DC
  Administered 2021-06-19 – 2021-06-26 (×2): 200 ug via INTRAVENOUS
  Filled 2021-06-17 (×4): qty 0.4

## 2021-06-17 MED ORDER — MIDODRINE HCL 5 MG PO TABS
5.0000 mg | ORAL_TABLET | Freq: Three times a day (TID) | ORAL | Status: DC
  Administered 2021-06-17 – 2021-06-25 (×23): 5 mg via ORAL
  Filled 2021-06-17 (×24): qty 1

## 2021-06-17 NOTE — Consult Note (Signed)
NAME:  Richard Hall, MRN:  [id number], DOB:  [birth year], LOS: 2 ADMISSION DATE:  06/15/2021, CONSULTATION DATE:  1/16 REFERRING MD:  Dr. Candiss Norse, CHIEF COMPLAINT:  Cavitary lung lesion.    History of Present Illness:  66 year old male with PMH as below, which is significant for AF, ESRD on HD, HTN, CHF, and prostate Ca. Recently admitted 12/30 > 1/13 septic shock r/t MSSA bacteremia and COVID 19 infection requiring intubation. TEE without endocarditis. He was discharged to rehab facility with plans for cefazolin through 1/19. However, he was again admitted 1/14 for hypoxia with concern for pneumonia. Troponins were also elevated prompting cardiology consult. He was admitted to the hospitalist service and was continued on cefazolin. Hypoxia did improve with hemodialysis, however, CT of the chest demonstrated new cavitary lesion in the right upper lobe. PCCM consulted due to abnormal CT.   Pertinent  Medical History   has a past medical history of A-V fistula ([place]), Anemia, Arthritis, CHF (congestive heart failure) ([place]), CVA (cerebral vascular accident) ([place]) (2010), Diabetes mellitus with end-stage renal disease ([place]), Diabetic neuropathy ([place]), Dyslipidemia, Dysrhythmia, Essential hypertension, Hepatitis, History of blood transfusion, Hypothyroidism (acquired), OSA on CPAP, Prostate cancer ([place]), and Uses wheelchair.   Significant Hospital Events: Including procedures, antibiotic start and stop dates in addition to other pertinent events   12/30 > 1/13 admit for MSSA pna, sepsis, covid 19. Intubated.  1/14 admitted for hypoxia  Interim History / Subjective:    Objective   Blood pressure (!) 81/44, pulse 73, temperature (!) 97.4 F (36.3 C), resp. rate (!) 22, height 6' (1.829 m), weight 122.4 kg, SpO2 98 %.        Intake/Output Summary (Last 24 hours) at 06/17/2021 1049 Last data filed at 06/16/2021 1810 Gross per 24 hour  Intake 0 ml  Output --  Net 0 ml   Filed Weights   06/15/21  1523 06/16/21 0500 06/17/21 0742  Weight: 118 kg 117.4 kg 122.4 kg    Examination: General: overweight elderly male in NAD HENT: [place]/AT, PERRL, no JVD Lungs: Diminished bases Cardiovascular: RRR, no MRG Abdomen: Soft, non-tender, non-distended Extremities: No acute deformity or ROM limitation.  Neuro: Somonlent post HD, but arouses to voice and answers questions.  Imaging reviewed personally:  CT chest 1/15: small bilateral pleural effusions. Vascular congestion. 1.4 x 1.2 cm cavitary nodule in the RUL. Right paratracheal node enlargement. Diffuse sclerotic lesions of the skeleton c/w widespread metastatic disease.   Resolved Hospital Problem list     Assessment & Plan:   Hypoxia: documented sats 89% on room air. Now on 5 L. Likely in the setting of hypervolemia. Seems to be improving with HD.  - Supplemental O2 to keep O2 sats > 90-95% - Pulmonary hygiene including early mobilization and incentive spirometry.   RUL cavitary lesion: The question was for concern for chronic aspiration. According to SLP eval he has improved and has no other s/s of aspiration on imaging with the exception of this cavitation. Could this be the result of septic embolization following his recent admit for MSSA bacteremia? TEE was negative at that time.  - Ongoing SLP eval and management - Aspiration precautions - Send quantiferon gold - Reasonable to escalate to aspiration coverage, as you have,in light of new CT finding and elevated procalcitonin. However, PCT may not be as reliable in the setting of metastatic Ca. Would extend bacteremia coverage for MSSA to 6 weeks given here may have been undiagnosed infective endocarditis.    Prostate  ca with bone mets palliative tx - per primary, oncology, rad onc.    Labs   CBC: Recent Labs  Lab 06/13/21 2148 06/15/21 0833 06/15/21 0843 06/17/21 0405  WBC 9.9 16.7*  --  7.6  NEUTROABS  --  15.6*  --  6.2  HGB 9.0* 8.6* 8.2* 7.4*  HCT 27.6* 28.1* 24.0*  24.6*  MCV 89.9 91.5  --  91.8  PLT 221 218  --  696    Basic Metabolic Panel: Recent Labs  Lab 06/13/21 0324 06/14/21 0206 06/15/21 0833 06/15/21 0843 06/16/21 0105 06/17/21 0405  NA 126* 128* 125* 124* 129* 127*   126*  K 4.0 3.7 5.6* 5.8* 4.0 4.1   4.1  CL 90* 93* 87*  --  91* 88*   87*  CO2 23 26 20*  --  $R'25 24   25  'Wn$ GLUCOSE 177* 185* 208*  --  178* 161*   160*  BUN 87* 72* 96*  --  57* 72*   71*  CREATININE 6.72* 5.87* 7.21*  --  5.36* 6.42*   6.31*  CALCIUM 6.7* 6.8* 7.3*  --  7.6* 7.6*   7.7*  MG  --   --   --   --   --  2.4  PHOS  --   --   --   --  5.9* 6.5*   GFR: Estimated Creatinine Clearance: 15.6 mL/min (A) (by C-G formula based on SCr of 6.31 mg/dL (H)). Recent Labs  Lab 06/13/21 2148 06/15/21 0833 06/15/21 0839 06/16/21 0105 06/17/21 0405  PROCALCITON  --   --   --  4.53  --   WBC 9.9 16.7*  --   --  7.6  LATICACIDVEN  --   --  1.2  --   --     Liver Function Tests: Recent Labs  Lab 06/15/21 0833 06/16/21 0105 06/17/21 0405  AST 27  --  17  ALT 5  --  <5  ALKPHOS 89  --  81  BILITOT 0.8  --  0.7  PROT 6.7  --  6.7  ALBUMIN 2.0* 2.1* 2.1*   2.1*   No results for input(s): LIPASE, AMYLASE in the last 168 hours. No results for input(s): AMMONIA in the last 168 hours.  ABG    Component Value Date/Time   PHART 7.418 06/15/2021 1837   PCO2ART 38.7 06/15/2021 1837   PO2ART 81.2 (L) 06/15/2021 1837   HCO3 24.5 06/15/2021 1837   TCO2 23 06/15/2021 0843   ACIDBASEDEF 2.0 06/15/2021 0843   O2SAT 95.6 06/15/2021 1837     Coagulation Profile: No results for input(s): INR, PROTIME in the last 168 hours.  Cardiac Enzymes: No results for input(s): CKTOTAL, CKMB, CKMBINDEX, TROPONINI in the last 168 hours.  HbA1C: Hgb A1c MFr Bld  Date/Time Value Ref Range Status  06/01/2021 02:32 AM 6.1 (H) 4.8 - 5.6 % Final    Comment:    (NOTE) Pre diabetes:          5.7%-6.4%  Diabetes:              >6.4%  Glycemic control for   <7.0% adults with  diabetes   07/26/2020 10:45 AM 10.1 (H) 4.8 - 5.6 % Final    Comment:    (NOTE) Pre diabetes:          5.7%-6.4%  Diabetes:              >6.4%  Glycemic control for   <7.0% adults  with diabetes     CBG: Recent Labs  Lab 06/15/21 1954 06/16/21 0905 06/16/21 1208 06/16/21 1635 06/16/21 2116  GLUCAP 156* 155* 165* 195* 174*    Review of Systems:   Bolds are positive  Constitutional: weight loss, gain, night sweats, Fevers, chills, fatigue .  HEENT: headaches, Sore throat, sneezing, nasal congestion, post nasal drip, Difficulty swallowing, Tooth/dental problems, visual complaints visual changes, ear ache CV:  chest pain, radiates:,Orthopnea, PND, swelling in lower extremities, dizziness, palpitations, syncope.  GI  heartburn, indigestion, abdominal pain, nausea, vomiting, diarrhea, change in bowel habits, loss of appetite, bloody stools.  Resp: cough, productive: , hemoptysis, dyspnea, chest pain, pleuritic.  Skin: rash or itching or icterus GU: dysuria, change in color of urine, urgency or frequency. flank pain, hematuria  MS: back pain or swelling. decreased range of motion  Psych: change in mood or affect. depression or anxiety.  Neuro: difficulty with speech, weakness, numbness, ataxia    Past Medical History:  He,  has a past medical history of A-V fistula ([place]), Anemia, Arthritis, CHF (congestive heart failure) ([place]), CVA (cerebral vascular accident) ([place]) (2010), Diabetes mellitus with end-stage renal disease ([place]), Diabetic neuropathy ([place]), Dyslipidemia, Dysrhythmia, Essential hypertension, Hepatitis, History of blood transfusion, Hypothyroidism (acquired), OSA on CPAP, Prostate cancer ([place]), and Uses wheelchair.   Surgical History:   Past Surgical History:  Procedure Laterality Date   APPENDECTOMY     COLONOSCOPY     FEMUR IM NAIL Left 05/20/2019   Procedure: Intramedullary (Im) Retrograde Femoral Nailing for impending pathologic fracture;  Surgeon: Altamese [place], MD;  Location: [place];  Service: Orthopedics;  Laterality: Left;   ORIF FEMUR FRACTURE Left 05/20/2019   Procedure: OPEN REDUCTION INTERNAL FIXATION (ORIF) DISTAL FEMUR FRACTURE with intercondylar extension;  Surgeon: Altamese [place], MD;  Location: [place];  Service: Orthopedics;  Laterality: Left;   TRANSURETHRAL RESECTION OF BLADDER TUMOR N/A 01/07/2019   Procedure: CYSTOSCOPY/ EVEACUATION CLOT;  Surgeon: Bjorn Loser, MD;  [facility];  Service: Urology;  Laterality: N/A;   TRANSURETHRAL RESECTION OF PROSTATE N/A 07/26/2020   Procedure: TRANSURETHRAL RESECTION OF THE PROSTATE (TURP);  Surgeon: Alexis Frock, MD;  [facility];  Service: Urology;  Laterality: N/A;  108 MINS     Social History:   reports that he quit smoking about 26 years ago. He has never used smokeless tobacco. He reports that he does not drink alcohol and does not use drugs.   Family History:  His family history includes Cancer in his mother.   Allergies Allergies  Allergen Reactions   Gabapentin Swelling     Home Medications  Prior to Admission medications   Medication Sig Start Date End Date Taking? Authorizing Provider  acetaminophen (TYLENOL) 500 MG tablet Take 1,000 mg by mouth every 6 (six) hours as needed for mild pain.   Yes [provider]  amitriptyline (ELAVIL) 100 MG tablet Take 1 tablet (100 mg total) by mouth at bedtime. 05/25/19  Yes Christian, Rylee, MD  atorvastatin (LIPITOR) 80 MG tablet Take 80 mg by mouth every evening. 03/08/20  Yes [provider]  diphenoxylate-atropine (LOMOTIL) 2.5-0.025 MG tablet Take 1 tablet by mouth every 6 (six) hours as needed for diarrhea or loose stools. 04/23/21  Yes [provider]  ELIQUIS 2.5 MG TABS tablet Take 2.5 mg by mouth 2 (two) times daily. 04/22/21  Yes [provider]  furosemide (LASIX) 80 MG tablet Take 1 tablet (80 mg total) by mouth 4 (four) times a week. Mondays, wednesdays,  fridays, and  sundays Patient taking differently: Take 80 mg by mouth See admin instructions. 80mg  daily on Monday's, Wednesday's, Friday's, and Sunday's 05/26/19  Yes Christian, Rylee, MD  guaiFENesin (MUCINEX) 600 MG 12 hr tablet Take 600 mg by mouth every 12 (twelve) hours as needed for cough.   Yes [provider]  insulin glargine-yfgn (SEMGLEE) 100 UNIT/ML injection Inject 0.1 mLs (10 Units total) into the skin at bedtime. 06/15/21  Yes Arrien, Jimmy Picket, MD  ipratropium-albuterol (DUONEB) 0.5-2.5 (3) MG/3ML SOLN Take 3 mLs by nebulization every 6 (six) hours as needed (shortness of breath).   Yes [provider]  levothyroxine (SYNTHROID) 100 MCG tablet Take 100 mcg by mouth daily. 05/21/21  Yes [provider]  lidocaine-prilocaine (EMLA) cream Apply 1 application topically See admin instructions. Apply small amount to access site 1-2 hours before dialysis, cover with occlusive dressing (saran wrap) Patient taking differently: Apply 1 application topically every Monday, Wednesday, and Friday. Apply small amount to access site 1-2 hours before dialysis, cover with occlusive dressing (saran wrap) 05/25/19  Yes Christian, Rylee, MD  loperamide (IMODIUM) 2 MG capsule Take 1 capsule (2 mg total) by mouth every 6 (six) hours as needed for diarrhea or loose stools. 06/14/21  Yes Arrien, Jimmy Picket, MD  metoprolol tartrate (LOPRESSOR) 25 MG tablet Take 25 mg by mouth 2 (two) times daily. 02/06/20  Yes [provider]  Multiple Vitamin (MULTIVITAMIN) tablet Take 1 tablet by mouth daily.   Yes [provider]  sertraline (ZOLOFT) 100 MG tablet Take 100 mg by mouth daily. 02/06/20  Yes [provider]  XTANDI 40 MG capsule Take 160 mg by mouth at bedtime. 03/12/20  Yes [provider]  ceFAZolin (ANCEF) IVPB Inject 2 g into the vein every Monday, Wednesday, and Friday at 6 PM. Indication: MSSA bacteremia. Give cefazolin IV dose after each hemodialysis  session MWF. First Dose: No Last Day of Therapy:  07/17/2021 Labs - Once weekly:  CBC/D and BMP, Labs - Every other week:  ESR and CRP Method of administration: IV Push Patient not taking: Reported on 06/15/2021 06/17/21   Arrien, Jimmy Picket, MD  LEUPROLIDE ACETATE, 6 MONTH, 45 MG injection Inject 45 mg into the skin every 6 (six) months. Patient not taking: Reported on 06/15/2021 05/25/19   Mitzi Hansen, MD  multivitamin (RENA-VIT) TABS tablet Take 1 tablet by mouth at bedtime. Patient not taking: Reported on 06/15/2021 06/14/21 07/14/21  Arrien, Jimmy Picket, MD      Georgann Housekeeper, AGACNP-BC [place] for personal pager PCCM on call pager [phone] until 7pm. Please call Elink 7p-7a. [phone]  06/17/2021 12:14 PM

## 2021-06-17 NOTE — Plan of Care (Signed)
°  Problem: Pain Managment: Goal: General experience of comfort will improve Outcome: Progressing   Problem: Health Behavior/Discharge Planning: Goal: Ability to manage health-related needs will improve Outcome: Not Progressing   Problem: Activity: Goal: Risk for activity intolerance will decrease Outcome: Not Progressing

## 2021-06-17 NOTE — Evaluation (Signed)
Occupational Therapy Evaluation Patient Details Name: Richard Hall MRN: [id number] DOB: 30-Sep-1955 Today's Date: 06/17/2021   History of Present Illness 66 y.o. male with medical history significant of hypertension, atrial fibrillation, congestive heart failure, CVA, ESRD on HD, and prostate cancer. Recently d/c from hospital to SNF and readmitted with acute respiratory failure due to overload. On prior admission, found to have chronic non-healed Lt femur facture despite ORIF, will request NWB status LLE.   Clinical Impression   Pt presents with decline in function and safety with ADLs and ADL mobility with impaired strength, balance, endurance and cognition. Pt d/c from hospital 06/14/21 to SNF, then re admitted to hospital. Pt required mod A for rolling in bed and to sir EOB, mod A for balance ad support, max A with UB ADLs and grooming tasks. Pt unable to transfer safely at this time, recommend nursing staff use mechanical lift. Pt will most likely return to SNF once medically ready. Pt would benefit from acute OT services to maximize level of function and safety \   Recommendations for follow up therapy are one component of a multi-disciplinary discharge planning process, led by the attending physician.  Recommendations may be updated based on patient status, additional functional criteria and insurance authorization.   Follow Up Recommendations  Skilled nursing-short term rehab (<3 hours/day)    Assistance Recommended at Discharge    Patient can return home with the following Two people to help with walking and/or transfers;Two people to help with bathing/dressing/bathroom    Functional Status Assessment  Patient has had a recent decline in their functional status and/or demonstrates limited ability to make significant improvements in function in a reasonable and predictable amount of time  Equipment Recommendations  Other (comment) (TBD at SNF)    Recommendations for Other Services        Precautions / Restrictions Precautions Precautions: Fall Restrictions Weight Bearing Restrictions: Yes LLE Weight Bearing: Non weight bearing      Mobility Bed Mobility Overal bed mobility: Needs Assistance Bed Mobility: Rolling;Supine to Sit Rolling: Mod assist   Supine to sit: Max assist     General bed mobility comments: Mod A for reaching to rail in bed and rolling onto side with cues for technique and sequencing. Max A elevating trunk and for L LEs off EOB and back onto bed    Transfers                   General transfer comment: pt declined to attempt      Balance Overall balance assessment: Needs assistance Sitting-balance support: Feet supported;Single extremity supported Sitting balance-Leahy Scale: Poor Sitting balance - Comments: mod A for support/balance Postural control: Posterior lean                                 ADL either performed or assessed with clinical judgement   ADL Overall ADL's : Needs assistance/impaired Eating/Feeding: Set up;Supervision/ safety;Sitting;Bed level   Grooming: Wash/dry hands;Wash/dry face;Brushing hair;Moderate assistance;Sitting   Upper Body Bathing: Maximal assistance   Lower Body Bathing: Total assistance   Upper Body Dressing : Maximal assistance   Lower Body Dressing: Total assistance       Toileting- Clothing Manipulation and Hygiene: Total assistance;Bed level         General ADL Comments: pt limited by cognition and significant weakness and poor activity tolerance     Vision Baseline Vision/History: 1 Wears glasses Ability to  See in Adequate Light: 0 Adequate Patient Visual Report: No change from baseline       Perception     Praxis      Pertinent Vitals/Pain Pain Assessment: Faces Pain Score: 8  Faces Pain Scale: Hurts even more Facial Expression: Grimacing Body Movements: Absence of movements Pain Location: back Pain Descriptors / Indicators:  Discomfort;Grimacing Pain Intervention(s): Limited activity within patient's tolerance;Monitored during session;Premedicated before session     Hand Dominance Right   Extremity/Trunk Assessment Upper Extremity Assessment Upper Extremity Assessment: Generalized weakness   Lower Extremity Assessment Lower Extremity Assessment: Defer to PT evaluation       Communication Communication Communication: No difficulties   Cognition Arousal/Alertness: Lethargic Behavior During Therapy: Flat affect Overall Cognitive Status: No family/caregiver present to determine baseline cognitive functioning Area of Impairment: Attention;Memory;Following commands;Awareness;Problem solving;Safety/judgement                     Memory: Decreased short-term memory Following Commands: Follows one step commands with increased time;Follows one step commands inconsistently Safety/Judgement: Decreased awareness of deficits;Decreased awareness of safety   Problem Solving: Slow processing;Decreased initiation;Requires tactile cues;Requires verbal cues;Difficulty sequencing General Comments: Answered questions appropriately, a little confused about sequence of events leading up to d/c and readmission     General Comments       Exercises     Shoulder Instructions      Home Living Family/patient expects to be discharged to:: Skilled nursing facility Living Arrangements: Alone Available Help at Discharge: Available PRN/intermittently Type of Home: Apartment Home Access: Level entry     Home Layout: One level     Bathroom Shower/Tub: Teacher, early years/pre: Standard     Home Equipment: Cane - single Barista (2 wheels);Tub bench   Additional Comments: Recently admitted from SNF      Prior Functioning/Environment Prior Level of Function : Needs assist       Physical Assist : Mobility (physical);ADLs (physical) Mobility (physical): Transfers;Bed mobility ADLs  (physical): Bathing;Dressing;Toileting Mobility Comments: Pt was d/c to snf after last visit but quickly readmitted          OT Problem List: Decreased strength;Decreased activity tolerance;Decreased coordination;Decreased cognition;Cardiopulmonary status limiting activity;Obesity      OT Treatment/Interventions: Self-care/ADL training;DME and/or AE instruction;Therapeutic activities;Patient/family education;Balance training;Cognitive remediation/compensation;Therapeutic exercise    OT Goals(Current goals can be found in the care plan section) Acute Rehab OT Goals Patient Stated Goal: "go back to rehab place" OT Goal Formulation: With patient Time For Goal Achievement: 07/01/21 Potential to Achieve Goals: Fair ADL Goals Pt Will Perform Eating: with set-up;sitting Pt Will Perform Grooming: with min assist;with min guard assist;sitting Pt Will Perform Upper Body Bathing: with mod assist;with min assist;sitting Pt Will Perform Upper Body Dressing: with mod assist;with min assist;sitting Pt Will Transfer to Toilet: with max assist;with mod assist;with transfer board (lateral scoot) Pt Will Perform Toileting - Clothing Manipulation and hygiene: with max assist;with mod assist;sitting/lateral leans  OT Frequency: Min 2X/week    Co-evaluation              AM-PAC OT "6 Clicks" Daily Activity     Outcome Measure   Help from another person taking care of personal grooming?: A Little Help from another person toileting, which includes using toliet, bedpan, or urinal?: Total Help from another person bathing (including washing, rinsing, drying)?: A Lot Help from another person to put on and taking off regular upper body clothing?: A Lot Help from another person to put  on and taking off regular lower body clothing?: Total 6 Click Score: 9   End of Session    Activity Tolerance: Patient limited by fatigue Patient left: in bed;with bed alarm set;with call bell/phone within reach  OT  Visit Diagnosis: Muscle weakness (generalized) (M62.81);Other symptoms and signs involving cognitive function;Other abnormalities of gait and mobility (R26.89);Pain Pain - part of body:  (back)                Time:  -    Charges:  OT General Charges $OT Visit: 1 Visit OT Evaluation $OT Eval Moderate Complexity: 1 Mod    [name] 06/17/2021, 3:46 PM

## 2021-06-17 NOTE — Progress Notes (Signed)
[facility] Progress Note   Subjective: Seen in HD unit. UF goal lowered d/t hypotension. He denies lightheadedness, cp, dyspnea. UF as able.   Objective Vitals:   06/17/21 0900 06/17/21 0911 06/17/21 0930 06/17/21 1000  BP: (!) 84/52 (!) 98/57 104/64 (!) 94/56  Pulse: 69 76 (!) 57 64  Resp: (!) 22 (!) 21 (!) 22 (!) 23  Temp:      TempSrc:      SpO2: 98%     Weight:      Height:          Additional Objective Labs: Basic Metabolic Panel: Recent Labs  Lab 06/15/21 0833 06/15/21 0843 06/16/21 0105 06/17/21 0405  NA 125* 124* 129* 127*   126*  K 5.6* 5.8* 4.0 4.1   4.1  CL 87*  --  91* 88*   87*  CO2 20*  --  25 24   25   GLUCOSE 208*  --  178* 161*   160*  BUN 96*  --  57* 72*   71*  CREATININE 7.21*  --  5.36* 6.42*   6.31*  CALCIUM 7.3*  --  7.6* 7.6*   7.7*  PHOS  --   --  5.9* 6.5*   CBC: Recent Labs  Lab 06/13/21 2148 06/15/21 0833 06/15/21 0843 06/17/21 0405  WBC 9.9 16.7*  --  7.6  NEUTROABS  --  15.6*  --  6.2  HGB 9.0* 8.6* 8.2* 7.4*  HCT 27.6* 28.1* 24.0* 24.6*  MCV 89.9 91.5  --  91.8  PLT 221 218  --  218   Blood Culture    Component Value Date/Time   SDES BLOOD RIGHT ARM 06/15/2021 0919   SPECREQUEST  06/15/2021 0919    BOTTLES DRAWN AEROBIC AND ANAEROBIC Blood Culture adequate volume   CULT  06/15/2021 0919    NO GROWTH 2 DAYS Performed at [facility], [place] [address] [place], [place] [address]    REPTSTATUS PENDING 06/15/2021 0919     Physical Exam General: Lying in bed, on HD with nasal oxygen  Heart: RRR Lungs: Clear bilaterally  Abdomen: obese, soft non-tender Extremities: trace LE edema  Dialysis Access: LUE AVF +bruit   Medications:   ceFAZolin (ANCEF) IV      amitriptyline  100 mg Oral QHS   apixaban  2.5 mg Oral BID   atorvastatin  80 mg Oral QPM   chlorhexidine  15 mL Mouth Rinse BID   Chlorhexidine Gluconate Cloth  6 each Topical Q0600   enzalutamide  160 mg Oral QHS   insulin aspart  0-6  Units Subcutaneous TID WC   insulin glargine-yfgn  10 Units Subcutaneous QHS   levothyroxine  100 mcg Oral QAC breakfast   lidocaine-prilocaine  1 application Topical Q M,W,F   mouth rinse  15 mL Mouth Rinse q12n4p   metoprolol tartrate  12.5 mg Oral BID   midodrine  5 mg Oral TID WC   multivitamin with minerals  1 tablet Oral Daily   sertraline  100 mg Oral Daily    Dialysis Orders:  [facility] MWF  4h  450 bfr  123kg  2/2 bath  LU AVF   Hep none  - mircera 225 mcg every 2 weeks - last received 200 mcg on 12/14  - venofer 100 mg (ordered for 10 doses)  - no vdra   Assessment/Plan: AHRF. Volume overload. Pulm edema on admit. At his dry weight per bed weights today. UF goal 3L as tolerated.  Try to get standing weight today.  Recent COVID pna - was on vent 1/05 to 1/08 ESRD - HD MWF.  HD today on schedule.  BP/ volume - Soft blood pressures this admission. Midordine 5mg  TID started.  Anemia ckd - Hgb 7.4. s/p prbcs last admit. Check Fe studies. Continue Aranesp 200 q Wed.    MBD ckd - cont auryxia ac.  Atrial Fibrillation w/ RVR - Rate controlled. ON  Eliquis, metoprolol  MSSA bacteremia. Prior to this admission. On cefazolin stop date 06/20/2021 Recent left ankle fracture=.  Follow-up, "none weightbearing left leg per last admit"    Lynnda Child PA-C [facility] 06/17/2021,10:08 AM

## 2021-06-17 NOTE — Progress Notes (Signed)
Pt requested to sit up; moved patient to recliner. He stayed in recliner for approximately one hour then requested to move back to bed. Slide used; left under patient in bed.  Patient reports no pain just tired.  Refused bipap overnight.

## 2021-06-17 NOTE — Progress Notes (Signed)
°  Transition of Care [facility]) Screening Note   Patient Details  Name: Richard Hall Date of Birth: 1956/04/20   Transition of Care [facility]) CM/SW Contact:    Cyndi Bender, RN Phone Number: 06/17/2021, 8:48 AM    Transition of Care Department [facility]) has reviewed patient and Will continue to follow patient. Patient was discharged from hospital 1/13 and went to [place] and rehab. After 4 hours of being there 02 sats dropped and EMS brought patient back to hospital. We will continue to monitor patient advancement through interdisciplinary progression rounds. If new patient transition needs arise, please place a TOC consult.

## 2021-06-17 NOTE — Progress Notes (Addendum)
PROGRESS NOTE                                                                                                                                                                                                             Patient Demographics:    Richard Hall, is a 66 y.o. male, DOB - Jun 30, 1955, YBW:[id number]  Outpatient Primary MD for the patient is [name], Ria Bush, MD    LOS - 2  Admit date - 06/15/2021    Chief Complaint  Patient presents with   Shortness of Breath       Brief Narrative (HPI from H&P)    Richard Hall is a 66 y.o. male with medical history significant of hypertension, atrial fibrillation, congestive heart failure, CVA, ESRD on HD, and prostate cancer presents with complaints of shortness of breath.  Patient had just recently been hospitalized from 12/30-1/13 after initially presenting with septic shock related to MSSA bacteremia and COVID-19 viral pneumonia.  During his hospital stay patient required intubation.  ID had been consulted and patient was recommended to complete 6 weeks of cefazolin to be completed on 07/17/2021.  He went to the rehab facility but soon was sent back to the hospital for hypoxia, in the ER his work-up was suggestive of possible continued pneumonia, troponin, cardiology was consulted and he was admitted for further care.   Subjective:   Patient in bed, appears comfortable, denies any headache, no fever, no chest pain or pressure, improved shortness of breath , no abdominal pain. No new focal weakness.   Assessment  & Plan :   Acute Hypoxic Resp. Failure due to Acute on chronic diastolic CHF in the presence of severe MR, EF 60% on recent echo with NSTEMI in a patient with ESRD and recent COVID-19 pneumonia with a new cavitary lesion on CT scan ?  Microaspiration - no sepsis this admission, however does have leukocytosis with a cavitary lesion on CT scan raising the suspicion for  possible underlying aspiration, SLP following, also continue fluid removal through HD, renal and cardiology on board will also request pulmonary to opine on his cavitary lung lesion.  Continue to monitor cultures.  RP and procalcitonin also point towards a bacterial infectious etiology as well.  Encouraged to sit up in chair in the daytime use I-S and flutter valve for pulmonary toiletry.  Will advance activity and titrate down oxygen as possible.  He is on cefazolin for recent MSSA bacteremia with stop date of 07/17/2021 (pharm confirmed) will add Unasyn as well and monitor.  2.  ESRD.  Renal on board for fluid removal through HD.  3.  Recent MSSA bacteremia.  On cefazolin stop date is 06/20/2021.  4.  Dysphagia.  Speech therapy following currently on dysphagia 2 diet.  Kindly see #1 above.  5.  Hypotension.  Blood pressure has stabilized and improving titrate off midodrine.  6.  Obesity with OSA.  BMI 35 follow with PCP for weight loss nightly CPAP.  Counseled on compliance  7.  Recent left ankle fracture.  Nonweightbearing left leg per last admission, outpatient orthopedics follow-up.  PT OT.  8.  Hypothyroidism.  On Synthroid   9.  Chronic A. fib [place] vas 2 score of greater than 4.  On Eliquis continue low-dose beta-blocker as tolerated by blood pressure.    10.  AOCD.  Monitor.  Richard.  Dyslipidemia.  On Synthroid continue  12. History of prostate cancer - Continue Xtandi.  13.  Elevated troponin.  Likely demand ischemia in the setting of ESRD, demand mismatch caused by hypoxia from #1 above, chest pain-free.  Cardiology on board, on Eliquis, statin, low-dose beta-blocker as tolerated by blood pressure and monitor  14.  Severe MR.  Outpatient cardiology follow-up.   15. DM type II.  On Lantus and sliding scale.  Monitor and adjust  Lab Results  Component Value Date   HGBA1C 6.1 (H) 06/01/2021    CBG (last 3)  Recent Labs    06/16/21 1208 06/16/21 1635 06/16/21 2116  GLUCAP  165* 195* 174*           Condition - Extremely Guarded  Family Communication  : None present  Code Status : Full code  Consults  : Cardiology, pulmonary  PUD Prophylaxis :    Procedures  :     CT scan - 1. The appearance of the chest suggest congestive heart failure, as above. 2. In addition, there is a new thick-walled cavitary nodule measuring 1.4 x 1.2 cm in the right upper lobe. This is potentially infectious or inflammatory in etiology, however, underlying neoplasm is not excluded. Short-term follow-up repeat noncontrast chest CT is recommended in 2-3 months to ensure the stability or regression of this lesion. 3. Mildly enlarged right paratracheal lymph node, nonspecific in the setting of presumed congestive heart failure, but concerning in light of the right upper lobe pulmonary nodule. Attention at time of repeat CT examination is recommended to ensure stability/regression. 4. Diffuse sclerotic lesions noted throughout the visualized axial and appendicular skeleton, presumably reflective of widespread osseous metastasis in this patient with history of prostate cancer. 5. Aortic atherosclerosis, in addition to left main and three-vessel coronary artery disease. Please note that although the presence of coronary artery calcium documents the presence of coronary artery disease, the severity of this disease and any potential stenosis cannot be assessed on this non-gated CT examination. Assessment for potential risk factor modification, dietary therapy or pharmacologic therapy may be warranted, if clinically indicated. 6. There are calcifications of the aortic valve. Echocardiographic correlation for evaluation of potential valvular dysfunction may be warranted if clinically indicated. Aortic Atherosclerosis (ICD10-I70.0).      Disposition Plan  :    Status is: Inpatient  Remains inpatient appropriate because: Acute hypoxic respiratory failure  DVT Prophylaxis  :    apixaban  (ELIQUIS) tablet 2.5 mg Start:  06/15/21 2200 apixaban (ELIQUIS) tablet 2.5 mg     Lab Results  Component Value Date   PLT 218 06/17/2021    Diet :  Diet Order             DIET DYS 2 Room service appropriate? No; Fluid consistency: Thin  Diet effective now                    Inpatient Medications  Scheduled Meds:  amitriptyline  100 mg Oral QHS   apixaban  2.5 mg Oral BID   atorvastatin  80 mg Oral QPM   chlorhexidine  15 mL Mouth Rinse BID   Chlorhexidine Gluconate Cloth  6 each Topical Q0600   enzalutamide  160 mg Oral QHS   insulin aspart  0-6 Units Subcutaneous TID WC   insulin glargine-yfgn  10 Units Subcutaneous QHS   levothyroxine  100 mcg Oral QAC breakfast   lidocaine-prilocaine  1 application Topical Q M,W,F   mouth rinse  15 mL Mouth Rinse q12n4p   metoprolol tartrate  12.5 mg Oral BID   midodrine  5 mg Oral TID WC   multivitamin with minerals  1 tablet Oral Daily   sertraline  100 mg Oral Daily   Continuous Infusions:   ceFAZolin (ANCEF) IV     PRN Meds:.acetaminophen **OR** acetaminophen, albuterol, diphenoxylate-atropine, guaiFENesin, ondansetron (ZOFRAN) IV     Time Spent in minutes  30   Lala Lund M.D on 06/17/2021 at 10:22 AM  To page go to [email or web address]   [facility] -  Office  [phone]  See all Orders from today for further details    Objective:   Vitals:   06/17/21 0900 06/17/21 0911 06/17/21 0930 06/17/21 1000  BP: (!) 84/52 (!) 98/57 104/64 (!) 94/56  Pulse: 69 76 (!) 57 64  Resp: (!) 22 (!) 21 (!) 22 (!) 23  Temp:      TempSrc:      SpO2: 98%     Weight:      Height:        Wt Readings from Last 3 Encounters:  06/17/21 122.4 kg  06/14/21 117.6 kg  07/26/20 125.3 kg     Intake/Output Summary (Last 24 hours) at 06/17/2021 1022 Last data filed at 06/16/2021 1810 Gross per 24 hour  Intake 0 ml  Output --  Net 0 ml     Physical Exam  Awake Alert, No new F.N deficits, Normal  affect [place].AT,PERRAL Supple Neck, No JVD,   Symmetrical Chest wall movement, Good air movement bilaterally, CTAB RRR,No Gallops, Rubs or new Murmurs,  +ve B.Sounds, Abd Soft, No tenderness,   No Cyanosis, Clubbing or edema      RN pressure injury documentation: Pressure Injury 06/11/21 Coccyx Medial Stage 2 -  Partial thickness loss of dermis presenting as a shallow open injury with a red, pink wound bed without slough. (Active)  06/11/21 2159  Location: Coccyx  Location Orientation: Medial  Staging: Stage 2 -  Partial thickness loss of dermis presenting as a shallow open injury with a red, pink wound bed without slough.  Wound Description (Comments):   Present on Admission:      Data Review:    CBC Recent Labs  Lab 06/13/21 2148 06/15/21 0833 06/15/21 0843 06/17/21 0405  WBC 9.9 16.7*  --  7.6  HGB 9.0* 8.6* 8.2* 7.4*  HCT 27.6* 28.1* 24.0* 24.6*  PLT 221 218  --  218  MCV 89.9 91.5  --  91.8  MCH 29.3 28.0  --  27.6  MCHC 32.6 30.6  --  30.1  RDW 17.8* 17.5*  --  17.3*  LYMPHSABS  --  0.3*  --  0.7  MONOABS  --  0.7  --  0.5  EOSABS  --  0.0  --  0.1  BASOSABS  --  0.0  --  0.0    Electrolytes Recent Labs  Lab 06/13/21 0324 06/14/21 0206 06/15/21 0833 06/15/21 0834 06/15/21 0839 06/15/21 0843 06/16/21 0105 06/17/21 0405  NA 126* 128* 125*  --   --  124* 129* 127*   126*  K 4.0 3.7 5.6*  --   --  5.8* 4.0 4.1   4.1  CL 90* 93* 87*  --   --   --  91* 88*   87*  CO2 23 26 20*  --   --   --  25 24   25   GLUCOSE 177* 185* 208*  --   --   --  178* 161*   160*  BUN 87* 72* 96*  --   --   --  57* 72*   71*  CREATININE 6.72* 5.87* 7.21*  --   --   --  5.36* 6.42*   6.31*  CALCIUM 6.7* 6.8* 7.3*  --   --   --  7.6* 7.6*   7.7*  AST  --   --  27  --   --   --   --  17  ALT  --   --  5  --   --   --   --  <5  ALKPHOS  --   --  89  --   --   --   --  81  BILITOT  --   --  0.8  --   --   --   --  0.7  ALBUMIN  --   --  2.0*  --   --   --  2.1* 2.1*   2.1*  MG   --   --   --   --   --   --   --  2.4  CRP  --   --   --   --   --   --   --  14.3*  PROCALCITON  --   --   --   --   --   --  4.53  --   LATICACIDVEN  --   --   --   --  1.2  --   --   --   BNP  --   --   --  1,728.0*  --   --  2,006.2* 2,107.4*    ------------------------------------------------------------------------------------------------------------------ No results for input(s): CHOL, HDL, LDLCALC, TRIG, CHOLHDL, LDLDIRECT in the last 72 hours.  Lab Results  Component Value Date   HGBA1C 6.1 (H) 06/01/2021    No results for input(s): TSH, T4TOTAL, T3FREE, THYROIDAB in the last 72 hours.  Invalid input(s): FREET3 ------------------------------------------------------------------------------------------------------------------ ID Labs Recent Labs  Lab 06/13/21 0324 06/13/21 2148 06/14/21 0206 06/15/21 0833 06/15/21 0839 06/16/21 0105 06/17/21 0405  WBC  --  9.9  --  16.7*  --   --  7.6  PLT  --  221  --  218  --   --  218  CRP  --   --   --   --   --   --  14.3*  PROCALCITON  --   --   --   --   --  4.53  --   LATICACIDVEN  --   --   --   --  1.2  --   --   CREATININE 6.72*  --  5.87* 7.21*  --  5.36* 6.42*   6.31*   Cardiac Enzymes No results for input(s): CKMB, TROPONINI, MYOGLOBIN in the last 168 hours.  Invalid input(s): CK  Radiology Reports CT CHEST WO CONTRAST  Result Date: 06/16/2021 CLINICAL DATA:  66 year old male with history of pneumonia. EXAM: CT CHEST WITHOUT CONTRAST TECHNIQUE: Multidetector CT imaging of the chest was performed following the standard protocol without IV contrast. RADIATION DOSE REDUCTION: This exam was performed according to the departmental dose-optimization program which includes automated exposure control, adjustment of the mA and/or kV according to patient size and/or use of iterative reconstruction technique. COMPARISON:  Chest CT 09/23/2017. FINDINGS: Cardiovascular: Heart size is mildly enlarged. There is no significant  pericardial fluid, thickening or pericardial calcification. There is aortic atherosclerosis, as well as atherosclerosis of the great vessels of the mediastinum and the coronary arteries, including calcified atherosclerotic plaque in the left main, left anterior descending, left circumflex and right coronary arteries. Calcifications of the aortic valve. Left atrial appendage occluding device noted. Mediastinum/Nodes: Mildly enlarged low right paratracheal lymph node measuring 1.7 cm in short axis (axial image 44 of series 3). No other pathologically enlarged mediastinal or hilar lymph nodes are confidently identified on today's noncontrast CT examination. Esophagus is unremarkable in appearance. No axillary lymphadenopathy. Lungs/Pleura: Diffuse ground-glass attenuation and mild interlobular septal thickening noted throughout the lungs bilaterally, suggesting a background of pulmonary edema. Moderate bilateral pleural effusions lying dependently with associated areas of passive subsegmental atelectasis in the lower lobes of the lungs bilaterally. There is a new thick-walled cavitary nodule in the right upper lobe (axial image 61 of series 4) measuring 1.4 x 1.2 cm. Upper Abdomen: Aortic atherosclerosis. Mild bilateral renal atrophy. Musculoskeletal: Diffuse sclerotic lesions noted throughout the visualized axial and appendicular skeleton, compatible with widespread metastatic disease in this patient with documented history of prostate cancer. IMPRESSION: 1. The appearance of the chest suggest congestive heart failure, as above. 2. In addition, there is a new thick-walled cavitary nodule measuring 1.4 x 1.2 cm in the right upper lobe. This is potentially infectious or inflammatory in etiology, however, underlying neoplasm is not excluded. Short-term follow-up repeat noncontrast chest CT is recommended in 2-3 months to ensure the stability or regression of this lesion. 3. Mildly enlarged right paratracheal lymph node,  nonspecific in the setting of presumed congestive heart failure, but concerning in light of the right upper lobe pulmonary nodule. Attention at time of repeat CT examination is recommended to ensure stability/regression. 4. Diffuse sclerotic lesions noted throughout the visualized axial and appendicular skeleton, presumably reflective of widespread osseous metastasis in this patient with history of prostate cancer. 5. Aortic atherosclerosis, in addition to left main and three-vessel coronary artery disease. Please note that although the presence of coronary artery calcium documents the presence of coronary artery disease, the severity of this disease and any potential stenosis cannot be assessed on this non-gated CT examination. Assessment for potential risk factor modification, dietary therapy or pharmacologic therapy may be warranted, if clinically indicated. 6. There are calcifications of the aortic valve. Echocardiographic correlation for evaluation of potential valvular dysfunction may be warranted if clinically indicated. Aortic Atherosclerosis (ICD10-I70.0). Electronically Signed   By: Vinnie Langton M.D.   On: 06/16/2021 Richard:59   DG CHEST PORT 1 VIEW  Result Date: 06/16/2021 CLINICAL DATA:  Shortness  of breath EXAM: PORTABLE CHEST 1 VIEW COMPARISON:  06/15/2021 FINDINGS: Cardiac shadow is enlarged. Aortic calcifications are again seen. Lungs are well aerated bilaterally. Persistent airspace opacity is again seen primarily within the right lung base similar to that seen on the prior exam. No bony abnormality is noted. IMPRESSION: Significant interval change from the prior study. Electronically Signed   By: Inez Catalina M.D.   On: 06/16/2021 03:59   DG Chest Portable 1 View  Result Date: 06/15/2021 CLINICAL DATA:  66 year old male with history of shortness of breath. EXAM: PORTABLE CHEST 1 VIEW COMPARISON:  Chest x-ray 06/06/2021. FINDINGS: Previously noted endotracheal tube and enteric tube have  been removed. Lung volumes are low. Diffuse peribronchial cuffing, widespread interstitial prominence and patchy asymmetrically distributed airspace disease is noted throughout the lungs bilaterally (right greater than left), significantly worsened compared to the prior study. Probable small bilateral pleural effusions. No pneumothorax. Heart size is normal. The patient is rotated to the left on today's exam, resulting in distortion of the mediastinal contours and reduced diagnostic sensitivity and specificity for mediastinal pathology. Atherosclerotic calcifications are noted in the thoracic aorta. IMPRESSION: 1. Substantially worsened multilobar bilateral pneumonia. 2. Aortic atherosclerosis. Electronically Signed   By: Vinnie Langton M.D.   On: 06/15/2021 08:28

## 2021-06-17 NOTE — Progress Notes (Signed)
Aware of pt's re-admission from snf. Pt receives out-pt HD at [facility] on MWF. Pt arrives at 11:00 for 11:15 chair time. Will assist as needed.  Melven Sartorius Renal Navigator [phone]

## 2021-06-17 NOTE — Progress Notes (Signed)
Pharmacy Antibiotic Note  Richard Hall is a 66 y.o. male admitted on 06/15/2021 with acute on chronic respiratory failure with hypoxia secondary to fluid overload  ESRD on HD.   He continues on cefazolin for recent MSSA bacteremia with stop date of 06/20/2021.   Today pharmacy has been consulted for Unasyn dosing for aspiration pneumonia.   ESRD- HD MWF  Plan: Start Unasyn 3 gm IV q24 hours Monitor clinical status,  and culture results daily.     Height: 6' (182.9 cm) Weight: 122.4 kg (269 lb 13.5 oz) IBW/kg (Calculated) : 77.6  Temp (24hrs), Avg:97.6 F (36.4 C), Min:97.4 F (36.3 C), Max:97.6 F (36.4 C)  Recent Labs  Lab 06/13/21 0324 06/13/21 2148 06/14/21 0206 06/15/21 0833 06/15/21 0839 06/16/21 0105 06/17/21 0405  WBC  --  9.9  --  16.7*  --   --  7.6  CREATININE 6.72*  --  5.87* 7.21*  --  5.36* 6.42*   6.31*  LATICACIDVEN  --   --   --   --  1.2  --   --     Estimated Creatinine Clearance: 15.6 mL/min (A) (by C-G formula based on SCr of 6.31 mg/dL (H)).    Allergies  Allergen Reactions   Gabapentin Swelling    Antimicrobials this admission: Vanc 1/14 x1 Cefepime 1/14 x1 Cefazolin 1/14 >>  (to be completed on 06/20/2021. Add Unasyn 1/16>>   (? asp pna)   Dose adjustments this admission:   Microbiology results: 1/14 BCx: no growth to date 1/14 MRSA PCR: negative   Thank you for allowing pharmacy to be a part of this patients care.  Nicole Cella, RPh Clinical Pharmacist [phone] 06/17/2021 10:54 AM Please check AMION for all [place] phone numbers After 10:00 PM, call [place] [phone]

## 2021-06-17 NOTE — Progress Notes (Signed)
Report given to HD for patient.  Transport took patient at 0715 to HD.

## 2021-06-17 NOTE — Progress Notes (Addendum)
Speech Language Pathology Treatment: Dysphagia  Patient Details Name: Richard Hall MRN: [id number] DOB: Feb 26, 1956 Today's Date: 06/17/2021 Time: 7262-0355 SLP Time Calculation (min) (ACUTE ONLY): 16 min  Assessment / Plan / Recommendation Clinical Impression  Pt seen for ongoing dysphagia tx with progression of diet to solids/mixed consistencies to assess oropharyngeal swallow.  Pt consumed peaches (mixed consistency)/solids with min impaired mastication yielding slow, but thorough bolus manipulation and no oral residue noted and/or overt s/s of aspiration present.  Pt exhibited a delayed cough with straw consumption of thin liquids, but this was eliminated with smaller sips provided by straw and/or cup sips.  Recommend progressing pt to a Dysphagia 3 (mechanical/soft) diet with thin liquids provided by small sips via straw/cup and swallowing precautions followed with INTERMITTENT supervision d/t pt exhibiting min impulsive intake.  ST will f/u for diet tolerance progression during acute stay.  HPI HPI: 66 y.o. male with medical history significant of hypertension, atrial fibrillation, congestive heart failure, CVA, ESRD on HD, and prostate cancer. Recently d/c (admitted 12/20-1/13)from hospital to SNF and readmitted with acute respiratory failure due to overload. Patient had been seen by ST services during previous admission and at that time was tolerating a Dys 2, thin liquids diet; ST continuing to f/u for diet progression.      SLP Plan  Continue with current plan of care      Recommendations for follow up therapy are one component of a multi-disciplinary discharge planning process, led by the attending physician.  Recommendations may be updated based on patient status, additional functional criteria and insurance authorization.    Recommendations  Diet recommendations: Dysphagia 3 (mechanical soft);Thin liquid Liquids provided via: Cup Medication Administration: Whole meds with  puree Supervision: Patient able to self feed;Intermittent supervision to cue for compensatory strategies Compensations: Slow rate;Small sips/bites                Oral Care Recommendations: Oral care BID;Staff/trained caregiver to provide oral care Follow Up Recommendations: Skilled nursing-short term rehab (<3 hours/day) Assistance recommended at discharge: Frequent or constant Supervision/Assistance SLP Visit Diagnosis: Dysphagia, unspecified (R13.10) Plan: Continue with current plan of care           Elvina Sidle, M.S., [place]  06/17/2021, 1:49 PM

## 2021-06-17 NOTE — Progress Notes (Signed)
OT Cancellation Note  Patient Details Name: Richard Hall MRN: [id number] DOB: 18-Apr-1956   Cancelled Treatment:    Reason Eval/Treat Not Completed: Patient at procedure or test/ unavailable. OT will follow up next available time as appropriate  [name] 06/17/2021, 10:30 AM

## 2021-06-18 ENCOUNTER — Telehealth: Payer: Self-pay

## 2021-06-18 LAB — GLUCOSE, CAPILLARY
Glucose-Capillary: 151 mg/dL — ABNORMAL HIGH (ref 70–99)
Glucose-Capillary: 163 mg/dL — ABNORMAL HIGH (ref 70–99)
Glucose-Capillary: 118 mg/dL — ABNORMAL HIGH (ref 70–99)
Glucose-Capillary: 152 mg/dL — ABNORMAL HIGH (ref 70–99)

## 2021-06-18 LAB — COMPREHENSIVE METABOLIC PANEL
ALT: <5 U/L (ref 0–44)
AST: 18 U/L (ref 15–41)
Albumin: 1.9 g/dL — ABNORMAL LOW (ref 3.5–5.0)
Alkaline Phosphatase: 84 U/L (ref 38–126)
Anion gap: 12 (ref 5–15)
BUN: 40 mg/dL — ABNORMAL HIGH (ref 8–23)
CO2: 26 mmol/L (ref 22–32)
Calcium: 7.6 mg/dL — ABNORMAL LOW (ref 8.9–10.3)
Chloride: 94 mmol/L — ABNORMAL LOW (ref 98–111)
Creatinine, Ser: 4.77 mg/dL — ABNORMAL HIGH (ref 0.61–1.24)
GFR, Estimated: 13 mL/min — ABNORMAL LOW (ref >60)
Glucose, Bld: 157 mg/dL — ABNORMAL HIGH (ref 70–99)
Potassium: 3.9 mmol/L (ref 3.5–5.1)
Sodium: 132 mmol/L — ABNORMAL LOW (ref 135–145)
Total Bilirubin: 0.4 mg/dL (ref 0.3–1.2)
Total Protein: 6.1 g/dL — ABNORMAL LOW (ref 6.5–8.1)

## 2021-06-18 LAB — CBC WITH DIFFERENTIAL/PLATELET
Abs Immature Granulocytes: 0.02 10*3/uL (ref 0.00–0.07)
Basophils Absolute: 0 10*3/uL (ref 0.0–0.1)
Basophils Relative: 1 %
Eosinophils Absolute: 0.1 10*3/uL (ref 0.0–0.5)
Eosinophils Relative: 1 %
HCT: 22.8 % — ABNORMAL LOW (ref 39.0–52.0)
Hemoglobin: 7 g/dL — ABNORMAL LOW (ref 13.0–17.0)
Immature Granulocytes: 0 %
Lymphocytes Relative: 9 %
Lymphs Abs: 0.5 10*3/uL — ABNORMAL LOW (ref 0.7–4.0)
MCH: 27.9 pg (ref 26.0–34.0)
MCHC: 30.7 g/dL (ref 30.0–36.0)
MCV: 90.8 fL (ref 80.0–100.0)
Monocytes Absolute: 0.4 10*3/uL (ref 0.1–1.0)
Monocytes Relative: 8 %
Neutro Abs: 4.7 10*3/uL (ref 1.7–7.7)
Neutrophils Relative %: 81 %
Platelets: 195 10*3/uL (ref 150–400)
RBC: 2.51 MIL/uL — ABNORMAL LOW (ref 4.22–5.81)
RDW: 17.1 % — ABNORMAL HIGH (ref 11.5–15.5)
WBC: 5.8 10*3/uL (ref 4.0–10.5)
nRBC: 0 % (ref 0.0–0.2)

## 2021-06-18 LAB — RENAL FUNCTION PANEL
Albumin: 2 g/dL — ABNORMAL LOW (ref 3.5–5.0)
Anion gap: 11 (ref 5–15)
BUN: 40 mg/dL — ABNORMAL HIGH (ref 8–23)
CO2: 27 mmol/L (ref 22–32)
Calcium: 7.6 mg/dL — ABNORMAL LOW (ref 8.9–10.3)
Chloride: 95 mmol/L — ABNORMAL LOW (ref 98–111)
Creatinine, Ser: 4.7 mg/dL — ABNORMAL HIGH (ref 0.61–1.24)
GFR, Estimated: 13 mL/min — ABNORMAL LOW (ref >60)
Glucose, Bld: 155 mg/dL — ABNORMAL HIGH (ref 70–99)
Phosphorus: 4.7 mg/dL — ABNORMAL HIGH (ref 2.5–4.6)
Potassium: 3.9 mmol/L (ref 3.5–5.1)
Sodium: 133 mmol/L — ABNORMAL LOW (ref 135–145)

## 2021-06-18 LAB — IRON AND TIBC
Iron: 26 ug/dL — ABNORMAL LOW (ref 45–182)
Saturation Ratios: 12 % — ABNORMAL LOW (ref 17.9–39.5)
TIBC: 220 ug/dL — ABNORMAL LOW (ref 250–450)
UIBC: 194 ug/dL

## 2021-06-18 LAB — FERRITIN: Ferritin: 638 ng/mL — ABNORMAL HIGH (ref 24–336)

## 2021-06-18 LAB — TYPE AND SCREEN
ABO/RH(D): A POS
Antibody Screen: NEGATIVE

## 2021-06-18 LAB — BRAIN NATRIURETIC PEPTIDE: B Natriuretic Peptide: 1929.3 pg/mL — ABNORMAL HIGH (ref 0.0–100.0)

## 2021-06-18 LAB — C-REACTIVE PROTEIN: CRP: 10.1 mg/dL — ABNORMAL HIGH (ref <1.0)

## 2021-06-18 LAB — MAGNESIUM: Magnesium: 2.3 mg/dL (ref 1.7–2.4)

## 2021-06-18 MED ORDER — MELATONIN 5 MG PO TABS
10.0000 mg | ORAL_TABLET | Freq: Once | ORAL | Status: AC
  Administered 2021-06-18: 10 mg via ORAL
  Filled 2021-06-18: qty 2

## 2021-06-18 MED ORDER — DIPHENHYDRAMINE HCL 25 MG PO CAPS
25.0000 mg | ORAL_CAPSULE | Freq: Once | ORAL | Status: DC
Start: 2021-06-18 — End: 2021-06-18

## 2021-06-18 MED ORDER — SODIUM CHLORIDE 0.9 % IV SOLN
250.0000 mg | Freq: Every day | INTRAVENOUS | Status: AC
  Administered 2021-06-18 – 2021-06-21 (×4): 250 mg via INTRAVENOUS
  Filled 2021-06-18 (×6): qty 20

## 2021-06-18 NOTE — Progress Notes (Signed)
Pt expressed extreme difficulty falling asleep.  States that he has not slept in two weeks.  Pt still awake at 0200.  MD on call notified and melatonin ordered.  Pt appeared to doze off before meds could be given.  Pt appeared to have a jerking motion and woke himself up.  He states this has happened before and stated that he has restless leg syndrome.  He states he doesn't take anything for it at home.  Pt also was unable to wear his CPAP past midnight. Stating that he couldn't fall asleep.

## 2021-06-18 NOTE — Care Management Important Message (Signed)
Important Message  Patient Details  Name: Richard Hall MRN: [id number] Date of Birth: [birth year]   Medicare Important Message Given:  Yes     Rohith Fauth Montine Circle 06/18/2021, 3:53 PM

## 2021-06-18 NOTE — Progress Notes (Signed)
[facility] Progress Note   Subjective:  Completed dialysis yesterday net UF 2.5L  Says his breathing is better but feels like has "something in my lungs"   Objective Vitals:   06/18/21 0045 06/18/21 0349 06/18/21 0419 06/18/21 0747  BP: 106/68 (!) 106/59  (!) 104/56  Pulse:  71  66  Resp: 15 18  (!) 21  Temp: 97.6 F (36.4 C) (!) 97.5 F (36.4 C)  (!) 97.5 F (36.4 C)  TempSrc: Oral Oral  Oral  SpO2: 95% 100%  93%  Weight:   122.4 kg   Height:          Additional Objective Labs: Basic Metabolic Panel: Recent Labs  Lab 06/16/21 0105 06/17/21 0405 06/18/21 0133  NA 129* 127*   126* 132*   133*  K 4.0 4.1   4.1 3.9   3.9  CL 91* 88*   87* 94*   95*  CO2 25 24   25 26   27   GLUCOSE 178* 161*   160* 157*   155*  BUN 57* 72*   71* 40*   40*  CREATININE 5.36* 6.42*   6.31* 4.77*   4.70*  CALCIUM 7.6* 7.6*   7.7* 7.6*   7.6*  PHOS 5.9* 6.5* 4.7*    CBC: Recent Labs  Lab 06/13/21 2148 06/15/21 0833 06/15/21 0843 06/17/21 0405 06/18/21 0133  WBC 9.9 16.7*  --  7.6 5.8  NEUTROABS  --  15.6*  --  6.2 4.7  HGB 9.0* 8.6* 8.2* 7.4* 7.0*  HCT 27.6* 28.1* 24.0* 24.6* 22.8*  MCV 89.9 91.5  --  91.8 90.8  PLT 221 218  --  218 195    Blood Culture    Component Value Date/Time   SDES BLOOD RIGHT ARM 06/15/2021 0919   SPECREQUEST  06/15/2021 0919    BOTTLES DRAWN AEROBIC AND ANAEROBIC Blood Culture adequate volume   CULT  06/15/2021 0919    NO GROWTH 3 DAYS Performed at [facility], [place]. [address] [place], [place] [address]    REPTSTATUS PENDING 06/15/2021 0919     Physical Exam General: Lying in bed, on HD with nasal oxygen  Heart: RRR Lungs: Clear bilaterally  Abdomen: obese, soft non-tender Extremities: trace LE edema  Dialysis Access: LUE AVF +bruit   Medications:  ampicillin-sulbactam (UNASYN) IV Stopped (06/17/21 1305)    ceFAZolin (ANCEF) IV Stopped (06/17/21 1415)   ferric gluconate (FERRLECIT) IVPB 250 mg (06/18/21 0953)     amitriptyline  100 mg Oral QHS   apixaban  2.5 mg Oral BID   atorvastatin  80 mg Oral QPM   chlorhexidine  15 mL Mouth Rinse BID   Chlorhexidine Gluconate Cloth  6 each Topical Q0600   [START ON 06/19/2021] darbepoetin (ARANESP) injection - DIALYSIS  200 mcg Intravenous Q Wed-HD   enzalutamide  160 mg Oral QHS   insulin aspart  0-6 Units Subcutaneous TID WC   insulin glargine-yfgn  10 Units Subcutaneous QHS   levothyroxine  100 mcg Oral QAC breakfast   lidocaine-prilocaine  1 application Topical Q M,W,F   mouth rinse  15 mL Mouth Rinse q12n4p   metoprolol tartrate  12.5 mg Oral BID   midodrine  5 mg Oral TID WC   multivitamin with minerals  1 tablet Oral Daily   sertraline  100 mg Oral Daily    Dialysis Orders:  [facility] MWF  4h  450 bfr  123kg  2/2 bath  LU AVF  Hep none  - mircera 225 mcg every 2 weeks - last received 200 mcg on 12/14  - venofer 100 mg (ordered for 10 doses)  - no vdra   Assessment/Plan: AHRF. Volume overload. Pulm edema on admit. Continue volume removal with HD. At his dry weight per bed weights. Challenge EDW as tolerated  Recent COVID pna - was on vent 1/05 to 1/08. Unasyn started 1/16 ?aspiration PNA  ESRD - HD MWF.  Continue on schedule. Next HD 1/18.  BP/ volume - Soft blood pressures this admission. Midordine 5mg  TID started.  Anemia ckd - Hgb 7.4. s/p prbcs last admit.  Continue Aranesp 200 q Wed.   Tsat 12% Ferritin 638- Will start Fe load here.  MBD ckd - cont auryxia ac.  Atrial Fibrillation w/ RVR - Rate controlled. ON  Eliquis, metoprolol  MSSA bacteremia. Prior to this admission. On cefazolin stop date 06/20/2021 New cavitary lesion in RUL on CT. Seen by PCCM and felt consistent with septic pulmonary embolus.     [facility] 06/18/2021,10:24 AM

## 2021-06-18 NOTE — Progress Notes (Signed)
Pt refusing bipap for the night. ?

## 2021-06-18 NOTE — Progress Notes (Signed)
PROGRESS NOTE                                                                                                                                                                                                             Patient Demographics:    Richard Hall, is a 66 y.o. male, DOB - 1956/05/20, TKW:[id number]  Outpatient Primary MD for the patient is [name], MD    LOS - 3  Admit date - 06/15/2021    Chief Complaint  Patient presents with   Shortness of Breath       Brief Narrative (HPI from H&P)    Richard Hall is a 66 y.o. male with medical history significant of hypertension, atrial fibrillation, congestive heart failure, CVA, ESRD on HD, and prostate cancer presents with complaints of shortness of breath.  Patient had just recently been hospitalized from 12/30-1/13 after initially presenting with septic shock related to MSSA bacteremia and COVID-19 viral pneumonia.  During his hospital stay patient required intubation.  ID had been consulted and patient was recommended to complete 6 weeks of cefazolin to be completed on 07/17/2021.  He went to the rehab facility but soon was sent back to the hospital for hypoxia, in the ER his work-up was suggestive of possible continued pneumonia, troponin, cardiology was consulted and he was admitted for further care.   Subjective:   Patient in bed, appears comfortable, denies any headache, no fever, no chest pain or pressure, no shortness of breath , no abdominal pain. No new focal weakness.   Assessment  & Plan :   Acute Hypoxic Resp. Failure due to Acute on chronic diastolic CHF in the presence of severe MR, EF 60% on recent echo with NSTEMI in a patient with ESRD and recent COVID-19 pneumonia with a new cavitary lesion on CT scan ?  Chronic intermittent microaspiration - no sepsis this admission, however does have leukocytosis with a cavitary lesion on CT scan raising the  suspicion for possible underlying aspiration, SLP following, also continue fluid removal through HD, renal and cardiology on board will also request pulmonary to opine on his cavitary lung lesion. CRP and procalcitonin also point towards a bacterial infectious etiology as well.  Cultures here this admission negative thus far.   Encouraged to sit up in chair in the daytime use I-S and flutter  valve for pulmonary toiletry.  Will advance activity and titrate down oxygen as possible.  He is on cefazolin for recent MSSA bacteremia with stop date of 07/17/2021 (pharm confirmed) will be on IV Unasyn here then 6 weeks of oral Augmentin upon discharge as recommended by pulmonary.  Appreciate pulmonary input as well.  This discharge will follow with pulmonary    2.  ESRD.  Renal on board for fluid removal through HD.  3.  Recent MSSA bacteremia.  On cefazolin stop date is 06/20/2021.  4.  Dysphagia.  Speech therapy following currently on dysphagia 2 diet.  Kindly see #1 above.  5.  Hypotension.  Blood pressure has stabilized and improving titrate off midodrine.  6.  Obesity with OSA.  BMI 35 follow with PCP for weight loss nightly CPAP.  Counseled on compliance  7.  Recent left ankle fracture.  Nonweightbearing left leg per last admission, outpatient orthopedics follow-up.  PT OT.  8.  Hypothyroidism.  On Synthroid   9.  Chronic A. fib [place] vas 2 score of greater than 4.  On Eliquis continue low-dose beta-blocker as tolerated by blood pressure.    10.  AOCD.  Monitor.  11.  Dyslipidemia.  On Synthroid continue  12. History of prostate cancer - Continue Xtandi.  13.  Elevated troponin.  Likely demand ischemia in the setting of ESRD, demand mismatch caused by hypoxia from #1 above, chest pain-free.  Cardiology on board, on Eliquis, statin, low-dose beta-blocker as tolerated by blood pressure and monitor  14.  Severe MR.  Outpatient cardiology follow-up.   15. DM type II.  On Lantus and sliding  scale.  Monitor and adjust  Lab Results  Component Value Date   HGBA1C 6.1 (H) 06/01/2021    CBG (last 3)  Recent Labs    06/17/21 1611 06/17/21 2029 06/18/21 0805  GLUCAP 157* 133* 151*           Condition - Extremely Guarded  Family Communication  : [name] [phone] on 06/17/21, 06/18/21  Code Status : Full code  Consults  : Cardiology, Pulmonary  PUD Prophylaxis :    Procedures  :     CT scan - 1. The appearance of the chest suggest congestive heart failure, as above. 2. In addition, there is a new thick-walled cavitary nodule measuring 1.4 x 1.2 cm in the right upper lobe. This is potentially infectious or inflammatory in etiology, however, underlying neoplasm is not excluded. Short-term follow-up repeat noncontrast chest CT is recommended in 2-3 months to ensure the stability or regression of this lesion. 3. Mildly enlarged right paratracheal lymph node, nonspecific in the setting of presumed congestive heart failure, but concerning in light of the right upper lobe pulmonary nodule. Attention at time of repeat CT examination is recommended to ensure stability/regression. 4. Diffuse sclerotic lesions noted throughout the visualized axial and appendicular skeleton, presumably reflective of widespread osseous metastasis in this patient with history of prostate cancer. 5. Aortic atherosclerosis, in addition to left main and three-vessel coronary artery disease. Please note that although the presence of coronary artery calcium documents the presence of coronary artery disease, the severity of this disease and any potential stenosis cannot be assessed on this non-gated CT examination. Assessment for potential risk factor modification, dietary therapy or pharmacologic therapy may be warranted, if clinically indicated. 6. There are calcifications of the aortic valve. Echocardiographic correlation for evaluation of potential valvular dysfunction may be warranted if clinically  indicated. Aortic Atherosclerosis (ICD10-I70.0).  Disposition Plan  :    Status is: Inpatient  Remains inpatient appropriate because: Acute hypoxic respiratory failure  DVT Prophylaxis  :    apixaban (ELIQUIS) tablet 2.5 mg Start: 06/15/21 2200 apixaban (ELIQUIS) tablet 2.5 mg     Lab Results  Component Value Date   PLT 195 06/18/2021    Diet :  Diet Order             DIET DYS 3 Room service appropriate? Yes; Fluid consistency: Thin  Diet effective now                    Inpatient Medications  Scheduled Meds:  amitriptyline  100 mg Oral QHS   apixaban  2.5 mg Oral BID   atorvastatin  80 mg Oral QPM   chlorhexidine  15 mL Mouth Rinse BID   Chlorhexidine Gluconate Cloth  6 each Topical Q0600   [START ON 06/19/2021] darbepoetin (ARANESP) injection - DIALYSIS  200 mcg Intravenous Q Wed-HD   enzalutamide  160 mg Oral QHS   insulin aspart  0-6 Units Subcutaneous TID WC   insulin glargine-yfgn  10 Units Subcutaneous QHS   levothyroxine  100 mcg Oral QAC breakfast   lidocaine-prilocaine  1 application Topical Q M,W,F   mouth rinse  15 mL Mouth Rinse q12n4p   metoprolol tartrate  12.5 mg Oral BID   midodrine  5 mg Oral TID WC   multivitamin with minerals  1 tablet Oral Daily   sertraline  100 mg Oral Daily   Continuous Infusions:  ampicillin-sulbactam (UNASYN) IV Stopped (06/17/21 1305)    ceFAZolin (ANCEF) IV Stopped (06/17/21 1415)   ferric gluconate (FERRLECIT) IVPB 250 mg (06/18/21 0953)   PRN Meds:.acetaminophen **OR** acetaminophen, albuterol, diphenoxylate-atropine, guaiFENesin, ondansetron (ZOFRAN) IV     Time Spent in minutes  30   Lala Lund M.D on 06/18/2021 at 11:43 AM  To page go to [email or web address]   [facility] -  Office  [phone]  See all Orders from today for further details    Objective:   Vitals:   06/18/21 0045 06/18/21 0349 06/18/21 0419 06/18/21 0747  BP: 106/68 (!) 106/59  (!) 104/56  Pulse:  71  66  Resp: 15  18  (!) 21  Temp: 97.6 F (36.4 C) (!) 97.5 F (36.4 C)  (!) 97.5 F (36.4 C)  TempSrc: Oral Oral  Oral  SpO2: 95% 100%  93%  Weight:   122.4 kg   Height:        Wt Readings from Last 3 Encounters:  06/18/21 122.4 kg  06/14/21 117.6 kg  07/26/20 125.3 kg     Intake/Output Summary (Last 24 hours) at 06/18/2021 1143 Last data filed at 06/17/2021 1415 Gross per 24 hour  Intake 200 ml  Output --  Net 200 ml     Physical Exam  Awake Alert, No new F.N deficits, Normal affect [place].AT,PERRAL Supple Neck, No JVD,   Symmetrical Chest wall movement, Good air movement bilaterally, CTAB RRR,No Gallops, Rubs or new Murmurs,  +ve B.Sounds, Abd Soft, No tenderness,   No Cyanosis, Clubbing or edema    RN pressure injury documentation: Pressure Injury 06/11/21 Coccyx Medial Stage 2 -  Partial thickness loss of dermis presenting as a shallow open injury with a red, pink wound bed without slough. (Active)  06/11/21 2159  Location: Coccyx  Location Orientation: Medial  Staging: Stage 2 -  Partial thickness loss of dermis presenting as a shallow open injury with a red,  pink wound bed without slough.  Wound Description (Comments):   Present on Admission:      Data Review:    CBC Recent Labs  Lab 06/13/21 2148 06/15/21 0833 06/15/21 0843 06/17/21 0405 06/18/21 0133  WBC 9.9 16.7*  --  7.6 5.8  HGB 9.0* 8.6* 8.2* 7.4* 7.0*  HCT 27.6* 28.1* 24.0* 24.6* 22.8*  PLT 221 218  --  218 195  MCV 89.9 91.5  --  91.8 90.8  MCH 29.3 28.0  --  27.6 27.9  MCHC 32.6 30.6  --  30.1 30.7  RDW 17.8* 17.5*  --  17.3* 17.1*  LYMPHSABS  --  0.3*  --  0.7 0.5*  MONOABS  --  0.7  --  0.5 0.4  EOSABS  --  0.0  --  0.1 0.1  BASOSABS  --  0.0  --  0.0 0.0    Electrolytes Recent Labs  Lab 06/14/21 0206 06/15/21 0833 06/15/21 0834 06/15/21 0839 06/15/21 0843 06/16/21 0105 06/17/21 0405 06/18/21 0133  NA 128* 125*  --   --  124* 129* 127*   126* 132*   133*  K 3.7 5.6*  --   --  5.8* 4.0  4.1   4.1 3.9   3.9  CL 93* 87*  --   --   --  91* 88*   87* 94*   95*  CO2 26 20*  --   --   --  25 24   25 26   27   GLUCOSE 185* 208*  --   --   --  178* 161*   160* 157*   155*  BUN 72* 96*  --   --   --  57* 72*   71* 40*   40*  CREATININE 5.87* 7.21*  --   --   --  5.36* 6.42*   6.31* 4.77*   4.70*  CALCIUM 6.8* 7.3*  --   --   --  7.6* 7.6*   7.7* 7.6*   7.6*  AST  --  27  --   --   --   --  17 18  ALT  --  5  --   --   --   --  <5 <5  ALKPHOS  --  89  --   --   --   --  81 84  BILITOT  --  0.8  --   --   --   --  0.7 0.4  ALBUMIN  --  2.0*  --   --   --  2.1* 2.1*   2.1* 1.9*   2.0*  MG  --   --   --   --   --   --  2.4 2.3  CRP  --   --   --   --   --   --  14.3* 10.1*  PROCALCITON  --   --   --   --   --  4.53  --   --   LATICACIDVEN  --   --   --  1.2  --   --   --   --   BNP  --   --  1,728.0*  --   --  2,006.2* 2,107.4* 1,929.3*    ------------------------------------------------------------------------------------------------------------------ No results for input(s): CHOL, HDL, LDLCALC, TRIG, CHOLHDL, LDLDIRECT in the last 72 hours.  Lab Results  Component Value Date   HGBA1C 6.1 (H) 06/01/2021    No results for  input(s): TSH, T4TOTAL, T3FREE, THYROIDAB in the last 72 hours.  Invalid input(s): FREET3 ------------------------------------------------------------------------------------------------------------------ ID Labs Recent Labs  Lab 06/13/21 2148 06/14/21 0206 06/15/21 0539 06/15/21 0839 06/16/21 0105 06/17/21 0405 06/18/21 0133  WBC 9.9  --  16.7*  --   --  7.6 5.8  PLT 221  --  218  --   --  218 195  CRP  --   --   --   --   --  14.3* 10.1*  PROCALCITON  --   --   --   --  4.53  --   --   LATICACIDVEN  --   --   --  1.2  --   --   --   CREATININE  --  5.87* 7.21*  --  5.36* 6.42*   6.31* 4.77*   4.70*   Cardiac Enzymes No results for input(s): CKMB, TROPONINI, MYOGLOBIN in the last 168 hours.  Invalid input(s): CK  Radiology Reports CT CHEST  WO CONTRAST  Result Date: 06/16/2021 CLINICAL DATA:  66 year old male with history of pneumonia. EXAM: CT CHEST WITHOUT CONTRAST TECHNIQUE: Multidetector CT imaging of the chest was performed following the standard protocol without IV contrast. RADIATION DOSE REDUCTION: This exam was performed according to the departmental dose-optimization program which includes automated exposure control, adjustment of the mA and/or kV according to patient size and/or use of iterative reconstruction technique. COMPARISON:  Chest CT 09/23/2017. FINDINGS: Cardiovascular: Heart size is mildly enlarged. There is no significant pericardial fluid, thickening or pericardial calcification. There is aortic atherosclerosis, as well as atherosclerosis of the great vessels of the mediastinum and the coronary arteries, including calcified atherosclerotic plaque in the left main, left anterior descending, left circumflex and right coronary arteries. Calcifications of the aortic valve. Left atrial appendage occluding device noted. Mediastinum/Nodes: Mildly enlarged low right paratracheal lymph node measuring 1.7 cm in short axis (axial image 44 of series 3). No other pathologically enlarged mediastinal or hilar lymph nodes are confidently identified on today's noncontrast CT examination. Esophagus is unremarkable in appearance. No axillary lymphadenopathy. Lungs/Pleura: Diffuse ground-glass attenuation and mild interlobular septal thickening noted throughout the lungs bilaterally, suggesting a background of pulmonary edema. Moderate bilateral pleural effusions lying dependently with associated areas of passive subsegmental atelectasis in the lower lobes of the lungs bilaterally. There is a new thick-walled cavitary nodule in the right upper lobe (axial image 61 of series 4) measuring 1.4 x 1.2 cm. Upper Abdomen: Aortic atherosclerosis. Mild bilateral renal atrophy. Musculoskeletal: Diffuse sclerotic lesions noted throughout the visualized  axial and appendicular skeleton, compatible with widespread metastatic disease in this patient with documented history of prostate cancer. IMPRESSION: 1. The appearance of the chest suggest congestive heart failure, as above. 2. In addition, there is a new thick-walled cavitary nodule measuring 1.4 x 1.2 cm in the right upper lobe. This is potentially infectious or inflammatory in etiology, however, underlying neoplasm is not excluded. Short-term follow-up repeat noncontrast chest CT is recommended in 2-3 months to ensure the stability or regression of this lesion. 3. Mildly enlarged right paratracheal lymph node, nonspecific in the setting of presumed congestive heart failure, but concerning in light of the right upper lobe pulmonary nodule. Attention at time of repeat CT examination is recommended to ensure stability/regression. 4. Diffuse sclerotic lesions noted throughout the visualized axial and appendicular skeleton, presumably reflective of widespread osseous metastasis in this patient with history of prostate cancer. 5. Aortic atherosclerosis, in addition to left main and three-vessel coronary artery disease. Please  note that although the presence of coronary artery calcium documents the presence of coronary artery disease, the severity of this disease and any potential stenosis cannot be assessed on this non-gated CT examination. Assessment for potential risk factor modification, dietary therapy or pharmacologic therapy may be warranted, if clinically indicated. 6. There are calcifications of the aortic valve. Echocardiographic correlation for evaluation of potential valvular dysfunction may be warranted if clinically indicated. Aortic Atherosclerosis (ICD10-I70.0). Electronically Signed   By: Vinnie Langton M.D.   On: 06/16/2021 11:59   DG CHEST PORT 1 VIEW  Result Date: 06/16/2021 CLINICAL DATA:  Shortness of breath EXAM: PORTABLE CHEST 1 VIEW COMPARISON:  06/15/2021 FINDINGS: Cardiac shadow is  enlarged. Aortic calcifications are again seen. Lungs are well aerated bilaterally. Persistent airspace opacity is again seen primarily within the right lung base similar to that seen on the prior exam. No bony abnormality is noted. IMPRESSION: Significant interval change from the prior study. Electronically Signed   By: Inez Catalina M.D.   On: 06/16/2021 03:59   DG Chest Portable 1 View  Result Date: 06/15/2021 CLINICAL DATA:  66 year old male with history of shortness of breath. EXAM: PORTABLE CHEST 1 VIEW COMPARISON:  Chest x-ray 06/06/2021. FINDINGS: Previously noted endotracheal tube and enteric tube have been removed. Lung volumes are low. Diffuse peribronchial cuffing, widespread interstitial prominence and patchy asymmetrically distributed airspace disease is noted throughout the lungs bilaterally (right greater than left), significantly worsened compared to the prior study. Probable small bilateral pleural effusions. No pneumothorax. Heart size is normal. The patient is rotated to the left on today's exam, resulting in distortion of the mediastinal contours and reduced diagnostic sensitivity and specificity for mediastinal pathology. Atherosclerotic calcifications are noted in the thoracic aorta. IMPRESSION: 1. Substantially worsened multilobar bilateral pneumonia. 2. Aortic atherosclerosis. Electronically Signed   By: Vinnie Langton M.D.   On: 06/15/2021 08:28

## 2021-06-18 NOTE — Telephone Encounter (Addendum)
-----   Message from Candee Furbish, MD sent at 06/17/2021  3:28 PM EST ----- Regarding: 6 week f/u for lung nodule 6 week f/u for lung nodule Richard Hall

## 2021-06-18 NOTE — NC FL2 (Signed)
[place] LEVEL OF CARE SCREENING TOOL     IDENTIFICATION  Patient Name: Richard Hall Birthdate: Jan 21, 1956 Sex: male Admission Date (Current Location): 06/15/2021  [facility] and [place] Number:  Herbalist and Address:  [facility], [place] [address], [place], [place] [address]      Provider Number: [id number]  Attending Physician Name and Address:  Thurnell Lose, MD  Relative Name and Phone Number:  Fedie,rodney [phone])   [phone] [facility] Phone)    Current Level of Care: Hospital Recommended Level of Care: [place] Prior Approval Number:    Date Approved/Denied:   PASRR Number: [id number]  Discharge Plan: SNF    Current Diagnoses: Patient Active Problem List   Diagnosis Date Noted   Hyperkalemia 06/15/2021   SIRS (systemic inflammatory response syndrome) ([place]) 06/15/2021   Fluid overload 06/15/2021   Pressure injury of skin 06/12/2021   Respiratory failure ([place])    Malnutrition of moderate degree 06/06/2021   MSSA bacteremia    Encephalopathy acute    Chronic obstructive pulmonary disease with acute lower respiratory infection ([place])    Elevated troponin    Atrial fibrillation with RVR ([place]) 05/31/2021   Acute on chronic respiratory failure with hypoxia ([place]) 05/31/2021   CHF exacerbation ([place]) 05/31/2021   Pneumonia due to COVID-19 virus 05/31/2021   Urinary retention 07/26/2020   Prostate cancer metastatic to bone ([place]) 05/18/2019   Fracture 05/17/2019   Hypocalcemia 05/14/2019   Bilateral leg edema 01/28/2019   Symptomatic anemia 01/05/2019   Gross hematuria 01/05/2019   Gastrointestinal hemorrhage with melena 01/05/2019   OSA on CPAP    Hypothyroidism (acquired)    Essential hypertension    Dyslipidemia    Diabetes mellitus with end-stage renal disease ([place])    Acute blood loss anemia    Chronic diastolic congestive heart failure ([place]) 02/09/2018   Anemia, chronic renal failure, stage  4 (severe) ([place]) 11/09/2017   Chronic respiratory failure with hypoxia and hypercapnia ([place]) 11/09/2017   Iron deficiency anemia 10/28/2017   Atrial fibrillation ([place]) 10/07/2017   Morbid obesity with BMI of 40.0-44.9, adult ([place]) 07/14/2016   Diabetic peripheral neuropathy associated with type 2 diabetes mellitus ([place]) 10/10/2015   Allergy 04/30/2015   Bronchitis 04/30/2015   ED (erectile dysfunction) of organic origin 04/30/2015   Lipoma of back 04/30/2015   Microalbuminuria 04/30/2015   Osteoarthritis 04/30/2015   Microalbuminuria due to type 2 diabetes mellitus ([place]) 01/09/2014   ESRD (end stage renal disease) on dialysis ([place]) 07/22/2013   Acute ill-defined cerebrovascular disease 04/04/2013   Dizziness 04/04/2013   Hypercholesterolemia 04/04/2013   Hypoxemia 04/04/2013   Insomnia 04/04/2013   Periodic limb movement disorder 04/04/2013   Diplopia 06/07/2012   Personal history of transient ischemic attack (TIA), and cerebral infarction without residual deficits 05/19/2011    Orientation RESPIRATION BLADDER Height & Weight     Self, Time, Situation, Place  O2 (2L nasal cannula; cpap at night) Continent, External catheter Weight: 269 lb 13.5 oz (122.4 kg) Height:  6' (182.9 cm)  BEHAVIORAL SYMPTOMS/MOOD NEUROLOGICAL BOWEL NUTRITION STATUS      Continent Diet (see dc summary)  AMBULATORY STATUS COMMUNICATION OF NEEDS Skin   Extensive Assist Verbally PU Stage and Appropriate Care (Pressure injury Coccyx medial stage 2)                       Personal Care Assistance Level of Assistance  Bathing, Feeding, Dressing Bathing Assistance:  Maximum assistance Feeding assistance: Limited assistance Dressing Assistance: Maximum assistance     Functional Limitations Info  Sight, Hearing, Speech Sight Info: Adequate Hearing Info: Adequate Speech Info: Adequate    SPECIAL CARE FACTORS FREQUENCY  PT (By licensed PT), OT (By licensed OT)     PT Frequency: 5x/week OT  Frequency: 5x/week            Contractures Contractures Info: Not present    Additional Factors Info  Code Status, Allergies, Psychotropic, Insulin Sliding Scale Code Status Info: Full Allergies Info: Gabapentin Psychotropic Info: Zoloft Insulin Sliding Scale Info: See dc summary       Current Medications (06/18/2021):  This is the current hospital active medication list Current Facility-Administered Medications  Medication Dose Route Frequency Provider Last Rate Last Admin   acetaminophen (TYLENOL) tablet 650 mg  650 mg Oral Q6H PRN Fuller Plan A, MD   650 mg at 06/18/21 3149   Or   acetaminophen (TYLENOL) suppository 650 mg  650 mg Rectal Q6H PRN Fuller Plan A, MD       albuterol (PROVENTIL) (2.5 MG/3ML) 0.083% nebulizer solution 2.5 mg  2.5 mg Nebulization Q4H PRN Tamala Julian, Rondell A, MD   2.5 mg at 06/17/21 0349   amitriptyline (ELAVIL) tablet 100 mg  100 mg Oral QHS Smith, Rondell A, MD   100 mg at 06/17/21 2128   Ampicillin-Sulbactam (UNASYN) 3 g in sodium chloride 0.9 % 100 mL IVPB  3 g Intravenous Q24H Wendee Beavers, RPH 200 mL/hr at 06/18/21 1216 3 g at 06/18/21 1216   apixaban (ELIQUIS) tablet 2.5 mg  2.5 mg Oral BID Fuller Plan A, MD   2.5 mg at 06/18/21 0840   atorvastatin (LIPITOR) tablet 80 mg  80 mg Oral QPM Smith, Rondell A, MD   80 mg at 06/17/21 1707   ceFAZolin (ANCEF) IVPB 2g/100 mL premix  2 g Intravenous Q M,W,F-HD Fuller Plan A, MD   Stopped at 06/17/21 1415   chlorhexidine (PERIDEX) 0.12 % solution 15 mL  15 mL Mouth Rinse BID Tamala Julian, Rondell A, MD   15 mL at 06/16/21 1020   Chlorhexidine Gluconate Cloth 2 % PADS 6 each  6 each Topical Q0600 Roney Jaffe, MD   6 each at 06/16/21 0620   [START ON 06/19/2021] Darbepoetin Alfa (ARANESP) injection 200 mcg  200 mcg Intravenous Q Wed-HD Lynnda Child, PA-C       diphenoxylate-atropine (LOMOTIL) 2.5-0.025 MG per tablet 1 tablet  1 tablet Oral Q6H PRN Norval Morton, MD       enzalutamide Gillermina Phy)  capsule 160 mg  160 mg Oral QHS Smith, Rondell A, MD       ferric gluconate (FERRLECIT) 250 mg in sodium chloride 0.9 % 250 mL IVPB  250 mg Intravenous Daily Reesa Chew, MD   Stopped at 06/18/21 1200   guaiFENesin (MUCINEX) 12 hr tablet 600 mg  600 mg Oral Q12H PRN Fuller Plan A, MD       insulin aspart (novoLOG) injection 0-6 Units  0-6 Units Subcutaneous TID WC Smith, Rondell A, MD   1 Units at 06/18/21 1328   insulin glargine-yfgn (SEMGLEE) injection 10 Units  10 Units Subcutaneous QHS Fuller Plan A, MD   10 Units at 06/17/21 2129   levothyroxine (SYNTHROID) tablet 100 mcg  100 mcg Oral QAC breakfast Fuller Plan A, MD   100 mcg at 06/18/21 0840   lidocaine-prilocaine (EMLA) cream 1 application  1 application Topical Q M,W,F Smith, Rondell A,  MD       MEDLINE mouth rinse  15 mL Mouth Rinse q12n4p Smith, Rondell A, MD   15 mL at 06/18/21 1532   metoprolol tartrate (LOPRESSOR) tablet 12.5 mg  12.5 mg Oral BID Thurnell Lose, MD   12.5 mg at 06/18/21 0838   midodrine (PROAMATINE) tablet 5 mg  5 mg Oral TID WC Thurnell Lose, MD   5 mg at 06/18/21 1215   multivitamin with minerals tablet 1 tablet  1 tablet Oral Daily Fuller Plan A, MD   1 tablet at 06/18/21 0838   ondansetron (ZOFRAN) injection 4 mg  4 mg Intravenous Q6H PRN Thurnell Lose, MD       sertraline (ZOLOFT) tablet 100 mg  100 mg Oral Daily Fuller Plan A, MD   100 mg at 06/18/21 8270     Discharge Medications: Please see discharge summary for a list of discharge medications.  Relevant Imaging Results:  Relevant Lab Results:   Additional Information SSN: [id number]; Recent covid.  Benard Halsted, LCSW

## 2021-06-19 LAB — CBC WITH DIFFERENTIAL/PLATELET
Abs Immature Granulocytes: 0.02 10*3/uL (ref 0.00–0.07)
Basophils Absolute: 0 10*3/uL (ref 0.0–0.1)
Basophils Relative: 1 %
Eosinophils Absolute: 0.1 10*3/uL (ref 0.0–0.5)
Eosinophils Relative: 2 %
HCT: 24.7 % — ABNORMAL LOW (ref 39.0–52.0)
Hemoglobin: 7.2 g/dL — ABNORMAL LOW (ref 13.0–17.0)
Immature Granulocytes: 0 %
Lymphocytes Relative: 10 %
Lymphs Abs: 0.6 10*3/uL — ABNORMAL LOW (ref 0.7–4.0)
MCH: 27.6 pg (ref 26.0–34.0)
MCHC: 29.1 g/dL — ABNORMAL LOW (ref 30.0–36.0)
MCV: 94.6 fL (ref 80.0–100.0)
Monocytes Absolute: 0.4 10*3/uL (ref 0.1–1.0)
Monocytes Relative: 7 %
Neutro Abs: 4.7 10*3/uL (ref 1.7–7.7)
Neutrophils Relative %: 80 %
Platelets: 189 10*3/uL (ref 150–400)
RBC: 2.61 MIL/uL — ABNORMAL LOW (ref 4.22–5.81)
RDW: 17.6 % — ABNORMAL HIGH (ref 11.5–15.5)
WBC: 5.9 10*3/uL (ref 4.0–10.5)
nRBC: 0 % (ref 0.0–0.2)

## 2021-06-19 LAB — HEPATIC FUNCTION PANEL
ALT: <5 U/L (ref 0–44)
AST: 27 U/L (ref 15–41)
Albumin: 2 g/dL — ABNORMAL LOW (ref 3.5–5.0)
Alkaline Phosphatase: 89 U/L (ref 38–126)
Bilirubin, Direct: <0.1 mg/dL (ref 0.0–0.2)
Total Bilirubin: 0.3 mg/dL (ref 0.3–1.2)
Total Protein: 6.4 g/dL — ABNORMAL LOW (ref 6.5–8.1)

## 2021-06-19 LAB — MAGNESIUM: Magnesium: 2.4 mg/dL (ref 1.7–2.4)

## 2021-06-19 LAB — RENAL FUNCTION PANEL
Albumin: 2 g/dL — ABNORMAL LOW (ref 3.5–5.0)
Anion gap: 10 (ref 5–15)
BUN: 49 mg/dL — ABNORMAL HIGH (ref 8–23)
CO2: 27 mmol/L (ref 22–32)
Calcium: 7.7 mg/dL — ABNORMAL LOW (ref 8.9–10.3)
Chloride: 93 mmol/L — ABNORMAL LOW (ref 98–111)
Creatinine, Ser: 6 mg/dL — ABNORMAL HIGH (ref 0.61–1.24)
GFR, Estimated: 10 mL/min — ABNORMAL LOW (ref >60)
Glucose, Bld: 163 mg/dL — ABNORMAL HIGH (ref 70–99)
Phosphorus: 5.9 mg/dL — ABNORMAL HIGH (ref 2.5–4.6)
Potassium: 3.9 mmol/L (ref 3.5–5.1)
Sodium: 130 mmol/L — ABNORMAL LOW (ref 135–145)

## 2021-06-19 LAB — GLUCOSE, CAPILLARY
Glucose-Capillary: 105 mg/dL — ABNORMAL HIGH (ref 70–99)
Glucose-Capillary: 99 mg/dL (ref 70–99)
Glucose-Capillary: 114 mg/dL — ABNORMAL HIGH (ref 70–99)
Glucose-Capillary: 196 mg/dL — ABNORMAL HIGH (ref 70–99)

## 2021-06-19 LAB — BRAIN NATRIURETIC PEPTIDE: B Natriuretic Peptide: 1938.6 pg/mL — ABNORMAL HIGH (ref 0.0–100.0)

## 2021-06-19 LAB — C-REACTIVE PROTEIN: CRP: 8.2 mg/dL — ABNORMAL HIGH (ref <1.0)

## 2021-06-19 NOTE — Progress Notes (Signed)
SLP Cancellation Note  Patient Details Name: Richard Hall MRN: [id number] DOB: [birth year]   Cancelled treatment:       Reason Eval/Treat Not Completed: Patient declined, no reason specified;Other (comment) (Patient politely declined any PO's at this time. Son in room and SLP did review Dys 3 solids diet with him). Will attempt to f/u next 1-2 dates.  Sonia Baller, MA, CCC-SLP Speech Therapy

## 2021-06-19 NOTE — Progress Notes (Signed)
Physical Therapy Treatment Patient Details Name: Deunte Bledsoe MRN: [id number] DOB: 08/16/1955 Today's Date: 06/19/2021   History of Present Illness 66 y.o. male with medical history significant of hypertension, atrial fibrillation, congestive heart failure, CVA, ESRD on HD, and prostate cancer. Recently d/c from hospital to SNF and readmitted with acute respiratory failure due to overload. On prior admission, found to have chronic non-healed Lt femur facture despite ORIF, will request NWB status LLE.    PT Comments    Pt agreeable to participate with encouragement form both son and therapist.  Emphasis on transitions to/from supine, scooting to EOB with UE assist and sit to stand in the STEDY with L LE in a position of NWB.   Recommendations for follow up therapy are one component of a multi-disciplinary discharge planning process, led by the attending physician.  Recommendations may be updated based on patient status, additional functional criteria and insurance authorization.  Follow Up Recommendations  Skilled nursing-short term rehab (<3 hours/day)     Assistance Recommended at Discharge Frequent or constant Supervision/Assistance  Patient can return home with the following Two people to help with walking and/or transfers;Two people to help with bathing/dressing/bathroom;Assistance with cooking/housework;Direct supervision/assist for medications management;Direct supervision/assist for financial management;Assist for transportation;Help with stairs or ramp for entrance   Equipment Recommendations  None recommended by PT    Recommendations for Other Services       Precautions / Restrictions Precautions Precautions: Fall Restrictions Weight Bearing Restrictions: Yes LLE Weight Bearing: Non weight bearing     Mobility  Bed Mobility Overal bed mobility: Needs Assistance Bed Mobility: Supine to Sit, Sit to Supine     Supine to sit: Max assist (lowered HOB) Sit to supine:  Max assist, +2 for physical assistance   General bed mobility comments: cues for sequencing, significant assist up via L elbow to sitting.  trunk and LE assist via L side to return to supine.    Transfers Overall transfer level: Needs assistance Equipment used: Ambulation equipment used Transfers: Sit to/from Stand             General transfer comment: sit to stand x3 from bed to STEDY seat and x3 from STEDY seat to bed.  L LE put in a biomechanical disadvantaged position to help or w/bear.  2 person assist to stand. Transfer via Lift Equipment: Stedy  Ambulation/Gait               General Gait Details: Scientific laboratory technician Rankin (Stroke Patients Only)       Balance Overall balance assessment: Needs assistance Sitting-balance support: Feet supported, Single extremity supported, No upper extremity supported Sitting balance-Leahy Scale: Poor Sitting balance - Comments: minimal assist  in addition to UE's   Standing balance support: Single extremity supported, Bilateral upper extremity supported, During functional activity, Reliant on assistive device for balance Standing balance-Leahy Scale: Zero                              Cognition Arousal/Alertness: Awake/alert Behavior During Therapy: Flat affect Overall Cognitive Status:  (likely close to baseline)                                          Exercises  General Comments General comments (skin integrity, edema, etc.): vss      Pertinent Vitals/Pain Pain Assessment Pain Assessment: Faces Faces Pain Scale: Hurts little more Pain Location: back Pain Descriptors / Indicators: Discomfort, Grimacing Pain Intervention(s): Limited activity within patient's tolerance, Monitored during session    Home Living                          Prior Function            PT Goals (current goals can now be found in the care  plan section) Acute Rehab PT Goals Patient Stated Goal: Get well PT Goal Formulation: With patient Time For Goal Achievement: 06/30/21 Potential to Achieve Goals: Fair Progress towards PT goals: Progressing toward goals    Frequency    Min 2X/week      PT Plan      Co-evaluation              AM-PAC PT "6 Clicks" Mobility   Outcome Measure  Help needed turning from your back to your side while in a flat bed without using bedrails?: A Lot Help needed moving from lying on your back to sitting on the side of a flat bed without using bedrails?: A Lot Help needed moving to and from a bed to a chair (including a wheelchair)?: Total Help needed standing up from a chair using your arms (e.g., wheelchair or bedside chair)?: Total Help needed to walk in hospital room?: Total Help needed climbing 3-5 steps with a railing? : Total 6 Click Score: 8    End of Session Equipment Utilized During Treatment: Oxygen Activity Tolerance: Patient tolerated treatment well Patient left: in bed;with call bell/phone within reach;with bed alarm set;with family/visitor present Nurse Communication: Mobility status;Need for lift equipment;Precautions;Weight bearing status PT Visit Diagnosis: Other abnormalities of gait and mobility (R26.89);Muscle weakness (generalized) (M62.81)     Time: 1720-1759 PT Time Calculation (min) (ACUTE ONLY): 39 min  Charges:  $Neuromuscular Re-education: 38-52 mins                     06/19/2021  Ginger Carne., PT Acute Rehabilitation Services [phone]  (pager) [phone]  (office)   Tessie Fass Brelynn Wheller 06/19/2021, 7:40 PM

## 2021-06-19 NOTE — Progress Notes (Signed)
[facility] Progress Note   Subjective:  Seen in dialysis unit. UF goal 2.5L. No cp, dyspnea this am.    Objective Vitals:   06/19/21 0751 06/19/21 0900 06/19/21 0930 06/19/21 1000  BP: 104/64 (!) 92/57 97/60 (!) 102/55  Pulse: 66 (!) 58 64 66  Resp: 18  17 20   Temp:      TempSrc:      SpO2:      Weight:      Height:          Additional Objective Labs: Basic Metabolic Panel: Recent Labs  Lab 06/17/21 0405 06/18/21 0133 06/19/21 0135  NA 127*   126* 132*   133* 130*  K 4.1   4.1 3.9   3.9 3.9  CL 88*   87* 94*   95* 93*  CO2 24   25 26   27 27   GLUCOSE 161*   160* 157*   155* 163*  BUN 72*   71* 40*   40* 49*  CREATININE 6.42*   6.31* 4.77*   4.70* 6.00*  CALCIUM 7.6*   7.7* 7.6*   7.6* 7.7*  PHOS 6.5* 4.7* 5.9*    CBC: Recent Labs  Lab 06/13/21 2148 06/13/21 2148 06/15/21 0833 06/15/21 0843 06/17/21 0405 06/18/21 0133 06/19/21 0135  WBC 9.9  --  16.7*  --  7.6 5.8 5.9  NEUTROABS  --    < > 15.6*  --  6.2 4.7 4.7  HGB 9.0*  --  8.6*   < > 7.4* 7.0* 7.2*  HCT 27.6*  --  28.1*   < > 24.6* 22.8* 24.7*  MCV 89.9  --  91.5  --  91.8 90.8 94.6  PLT 221  --  218  --  218 195 189   < > = values in this interval not displayed.    Blood Culture    Component Value Date/Time   SDES BLOOD RIGHT ARM 06/15/2021 0919   SPECREQUEST  06/15/2021 0919    BOTTLES DRAWN AEROBIC AND ANAEROBIC Blood Culture adequate volume   CULT  06/15/2021 0919    NO GROWTH 4 DAYS Performed at [facility], [place] [address] [place], [place] [address]    REPTSTATUS PENDING 06/15/2021 0919     Physical Exam General: Lying flat in bed, nad.  Heart: RRR No m,r,g  Lungs: Clear bilaterally  Abdomen: obese, soft non-tender Extremities: 1+ L E edema L >R  Dialysis Access: LUE AVF +bruit   Medications:  ampicillin-sulbactam (UNASYN) IV Stopped (06/19/21 1145)    ceFAZolin (ANCEF) IV Stopped (06/17/21 1415)   ferric gluconate (FERRLECIT) IVPB 250 mg (06/19/21  0955)    amitriptyline  100 mg Oral QHS   apixaban  2.5 mg Oral BID   atorvastatin  80 mg Oral QPM   chlorhexidine  15 mL Mouth Rinse BID   Chlorhexidine Gluconate Cloth  6 each Topical Q0600   darbepoetin (ARANESP) injection - DIALYSIS  200 mcg Intravenous Q Wed-HD   enzalutamide  160 mg Oral QHS   insulin aspart  0-6 Units Subcutaneous TID WC   insulin glargine-yfgn  10 Units Subcutaneous QHS   levothyroxine  100 mcg Oral QAC breakfast   lidocaine-prilocaine  1 application Topical Q M,W,F   mouth rinse  15 mL Mouth Rinse q12n4p   metoprolol tartrate  12.5 mg Oral BID   midodrine  5 mg Oral TID WC   multivitamin with minerals  1 tablet Oral Daily   sertraline  100 mg Oral Daily    Dialysis Orders:  [facility] MWF  4h  450 bfr  123kg  2/2 bath  LU AVF   Hep none  - mircera 225 mcg every 2 weeks - last received 200 mcg on 12/14  - venofer 100 mg (ordered for 10 doses)  - no vdra   Assessment/Plan: AHRF. Volume overload. Pulm edema on admit. Continue volume removal with HD. Now below OP dry weight. Will have lower EDW at discharge.  Recent COVID pna - was on vent 1/05 to 1/08. Unasyn started 1/16 ?aspiration PNA  ESRD - HD MWF.  Continue on schedule. HD today  BP/ volume - Soft blood pressures this admission. Midordine 5mg  TID started.  Anemia ckd - Hgb 7.4>7.2. s/p prbcs last admit.  Transfuse Hgb <7. Continue Aranesp 200 q Wed.   Tsat 12% Ferritin 638- Getting IV Fe load here.  MBD ckd - cont auryxia ac.  Atrial Fibrillation w/ RVR - Rate controlled. On Eliquis, metoprolol  MSSA bacteremia. Prior to this admission. On cefazolin stop date 07/17/21.  New cavitary lesion in RUL on CT. Seen by PCCM and felt consistent with septic pulmonary embolus. Plan for 6 weeks Augmentin at discharge     Lynnda Child PA-C [place] 06/19/2021,10:06 AM

## 2021-06-19 NOTE — Progress Notes (Signed)
PROGRESS NOTE                                                                                                                                                                                                             Patient Demographics:    Richard Hall, is a 66 y.o. male, DOB - 05/30/1956, PVX:[id number]  Outpatient Primary MD for the patient is [name], MD    LOS - 4  Admit date - 06/15/2021    Chief Complaint  Patient presents with   Shortness of Breath       Brief Narrative (HPI from H&P)    Humphrey Guerreiro is a 66 y.o. male with medical history significant of hypertension, atrial fibrillation, congestive heart failure, CVA, ESRD on HD, and prostate cancer presents with complaints of shortness of breath.  Patient had just recently been hospitalized from 12/30-1/13 after initially presenting with septic shock related to MSSA bacteremia and COVID-19 viral pneumonia.  During his hospital stay patient required intubation.  ID had been consulted and patient was recommended to complete 6 weeks of cefazolin to be completed on 07/17/2021.  He went to the rehab facility but soon was sent back to the hospital for hypoxia, in the ER his work-up was suggestive of possible continued pneumonia, troponin, cardiology was consulted and he was admitted for further care.   Subjective:   Patient in bed undergoing dialysis treatment, appears comfortable, denies any headache, no fever, no chest pain or pressure, much improved shortness of breath , no abdominal pain. No new focal weakness.    Assessment  & Plan :   Acute Hypoxic Resp. Failure due to Acute on chronic diastolic CHF in the presence of severe MR, EF 60% on recent echo with NSTEMI in a patient with ESRD and recent COVID-19 pneumonia with a new cavitary lesion on CT scan ?  Chronic intermittent microaspiration - no sepsis this admission, however does have leukocytosis with a  cavitary lesion on CT scan raising the suspicion for possible underlying aspiration, SLP following, also continue fluid removal through HD, renal and cardiology on board will also request pulmonary to opine on his cavitary lung lesion. CRP and procalcitonin also point towards a bacterial infectious etiology as well.  Cultures here this admission negative thus far.   Encouraged to sit up in chair in the  daytime use I-S and flutter valve for pulmonary toiletry.  Will advance activity and titrate down oxygen as possible.  He is on cefazolin for recent MSSA bacteremia with stop date of 07/17/2021 (pharm confirmed) will be on IV Unasyn here then 6 weeks of oral Augmentin upon discharge as recommended by pulmonary.  Appreciate pulmonary input as well.  This discharge will follow with pulmonary    2.  ESRD.  Renal on board for fluid removal through HD.  3.  Recent MSSA bacteremia.  On cefazolin stop date is 07/17/2021.  4.  Dysphagia.  Speech therapy following currently on dysphagia 2 diet.  Kindly see #1 above.  5.  Hypotension.  Blood pressure has stabilized and improving titrate off midodrine.  6.  Obesity with OSA.  BMI 35 follow with PCP for weight loss nightly CPAP.  Counseled on compliance  7.  Recent left ankle fracture.  Nonweightbearing left leg per last admission, outpatient orthopedics follow-up.  PT OT.  8.  Hypothyroidism.  On Synthroid   9.  Chronic A. fib [place] vas 2 score of greater than 4.  On Eliquis continue low-dose beta-blocker as tolerated by blood pressure.    10.  AOCD.  Monitor.  11.  Dyslipidemia.  On Synthroid continue  12. History of prostate cancer - Continue Xtandi.  13.  Elevated troponin.  Likely demand ischemia in the setting of ESRD, demand mismatch caused by hypoxia from #1 above, chest pain-free.  Cardiology on board, on Eliquis, statin, low-dose beta-blocker as tolerated by blood pressure and monitor  14.  Severe MR.  Outpatient cardiology follow-up.    15. DM type II.  On Lantus and sliding scale.  Monitor and adjust  Lab Results  Component Value Date   HGBA1C 6.1 (H) 06/01/2021    CBG (last 3)  Recent Labs    06/18/21 1731 06/18/21 2125 06/19/21 0815  GLUCAP 118* 152* 105*           Condition - Extremely Guarded  Family Communication  : [name] [phone] on 06/17/21, 06/18/21  Code Status : Full code  Consults  : Cardiology, Pulmonary  PUD Prophylaxis :    Procedures  :     CT scan - 1. The appearance of the chest suggest congestive heart failure, as above. 2. In addition, there is a new thick-walled cavitary nodule measuring 1.4 x 1.2 cm in the right upper lobe. This is potentially infectious or inflammatory in etiology, however, underlying neoplasm is not excluded. Short-term follow-up repeat noncontrast chest CT is recommended in 2-3 months to ensure the stability or regression of this lesion. 3. Mildly enlarged right paratracheal lymph node, nonspecific in the setting of presumed congestive heart failure, but concerning in light of the right upper lobe pulmonary nodule. Attention at time of repeat CT examination is recommended to ensure stability/regression. 4. Diffuse sclerotic lesions noted throughout the visualized axial and appendicular skeleton, presumably reflective of widespread osseous metastasis in this patient with history of prostate cancer. 5. Aortic atherosclerosis, in addition to left main and three-vessel coronary artery disease. Please note that although the presence of coronary artery calcium documents the presence of coronary artery disease, the severity of this disease and any potential stenosis cannot be assessed on this non-gated CT examination. Assessment for potential risk factor modification, dietary therapy or pharmacologic therapy may be warranted, if clinically indicated. 6. There are calcifications of the aortic valve. Echocardiographic correlation for evaluation of potential valvular  dysfunction may be warranted if clinically indicated. Aortic  Atherosclerosis (ICD10-I70.0).      Disposition Plan  :    Status is: Inpatient  Remains inpatient appropriate because: Acute hypoxic respiratory failure  DVT Prophylaxis  :    apixaban (ELIQUIS) tablet 2.5 mg Start: 06/15/21 2200 apixaban (ELIQUIS) tablet 2.5 mg     Lab Results  Component Value Date   PLT 189 06/19/2021    Diet :  Diet Order             DIET DYS 3 Room service appropriate? Yes; Fluid consistency: Thin  Diet effective now                    Inpatient Medications  Scheduled Meds:  amitriptyline  100 mg Oral QHS   apixaban  2.5 mg Oral BID   atorvastatin  80 mg Oral QPM   chlorhexidine  15 mL Mouth Rinse BID   Chlorhexidine Gluconate Cloth  6 each Topical Q0600   darbepoetin (ARANESP) injection - DIALYSIS  200 mcg Intravenous Q Wed-HD   enzalutamide  160 mg Oral QHS   insulin aspart  0-6 Units Subcutaneous TID WC   insulin glargine-yfgn  10 Units Subcutaneous QHS   levothyroxine  100 mcg Oral QAC breakfast   lidocaine-prilocaine  1 application Topical Q M,W,F   mouth rinse  15 mL Mouth Rinse q12n4p   metoprolol tartrate  12.5 mg Oral BID   midodrine  5 mg Oral TID WC   multivitamin with minerals  1 tablet Oral Daily   sertraline  100 mg Oral Daily   Continuous Infusions:  ampicillin-sulbactam (UNASYN) IV 3 g (06/18/21 1216)    ceFAZolin (ANCEF) IV Stopped (06/17/21 1415)   ferric gluconate (FERRLECIT) IVPB Stopped (06/18/21 1200)   PRN Meds:.acetaminophen **OR** acetaminophen, albuterol, diphenoxylate-atropine, guaiFENesin, ondansetron (ZOFRAN) IV     Time Spent in minutes  30   Lala Lund M.D on 06/19/2021 at 9:07 AM  To page go to [email or web address]   [facility] -  Office  [phone]  See all Orders from today for further details    Objective:   Vitals:   06/19/21 0342 06/19/21 0420 06/19/21 0722 06/19/21 0751  BP: (!) 107/58  100/60 104/64  Pulse: 66   68 66  Resp: 19  18 18   Temp: 97.7 F (36.5 C)  (!) 97.3 F (36.3 C)   TempSrc: Oral  Temporal   SpO2: 100%     Weight:  118.3 kg 119 kg   Height:        Wt Readings from Last 3 Encounters:  06/19/21 119 kg  06/14/21 117.6 kg  07/26/20 125.3 kg     Intake/Output Summary (Last 24 hours) at 06/19/2021 0907 Last data filed at 06/19/2021 0516 Gross per 24 hour  Intake 270 ml  Output 1 ml  Net 269 ml     Physical Exam  Awake Alert, No new F.N deficits, Normal affect [place].AT,PERRAL Supple Neck, No JVD,   Symmetrical Chest wall movement, Good air movement bilaterally, CTAB RRR,No Gallops, Rubs or new Murmurs,  +ve B.Sounds, Abd Soft, No tenderness,   No Cyanosis, Clubbing or edema     RN pressure injury documentation: Pressure Injury 06/11/21 Coccyx Medial Stage 2 -  Partial thickness loss of dermis presenting as a shallow open injury with a red, pink wound bed without slough. (Active)  06/11/21 2159  Location: Coccyx  Location Orientation: Medial  Staging: Stage 2 -  Partial thickness loss of dermis presenting as a shallow open injury with a  red, pink wound bed without slough.  Wound Description (Comments):   Present on Admission:      Data Review:    CBC Recent Labs  Lab 06/13/21 2148 06/15/21 0833 06/15/21 0843 06/17/21 0405 06/18/21 0133 06/19/21 0135  WBC 9.9 16.7*  --  7.6 5.8 5.9  HGB 9.0* 8.6* 8.2* 7.4* 7.0* 7.2*  HCT 27.6* 28.1* 24.0* 24.6* 22.8* 24.7*  PLT 221 218  --  218 195 189  MCV 89.9 91.5  --  91.8 90.8 94.6  MCH 29.3 28.0  --  27.6 27.9 27.6  MCHC 32.6 30.6  --  30.1 30.7 29.1*  RDW 17.8* 17.5*  --  17.3* 17.1* 17.6*  LYMPHSABS  --  0.3*  --  0.7 0.5* 0.6*  MONOABS  --  0.7  --  0.5 0.4 0.4  EOSABS  --  0.0  --  0.1 0.1 0.1  BASOSABS  --  0.0  --  0.0 0.0 0.0    Electrolytes Recent Labs  Lab 06/15/21 0833 06/15/21 0834 06/15/21 0839 06/15/21 0843 06/16/21 0105 06/17/21 0405 06/18/21 0133 06/19/21 0135  NA 125*  --   --  124*  129* 127*   126* 132*   133* 130*  K 5.6*  --   --  5.8* 4.0 4.1   4.1 3.9   3.9 3.9  CL 87*  --   --   --  91* 88*   87* 94*   95* 93*  CO2 20*  --   --   --  25 24   25 26   27 27   GLUCOSE 208*  --   --   --  178* 161*   160* 157*   155* 163*  BUN 96*  --   --   --  57* 72*   71* 40*   40* 49*  CREATININE 7.21*  --   --   --  5.36* 6.42*   6.31* 4.77*   4.70* 6.00*  CALCIUM 7.3*  --   --   --  7.6* 7.6*   7.7* 7.6*   7.6* 7.7*  AST 27  --   --   --   --  17 18 27   ALT 5  --   --   --   --  <5 <5 <5  ALKPHOS 89  --   --   --   --  81 84 89  BILITOT 0.8  --   --   --   --  0.7 0.4 0.3  ALBUMIN 2.0*  --   --   --  2.1* 2.1*   2.1* 1.9*   2.0* 2.0*   2.0*  MG  --   --   --   --   --  2.4 2.3 2.4  CRP  --   --   --   --   --  14.3* 10.1* 8.2*  PROCALCITON  --   --   --   --  4.53  --   --   --   LATICACIDVEN  --   --  1.2  --   --   --   --   --   BNP  --  1,728.0*  --   --  2,006.2* 2,107.4* 1,929.3* 1,938.6*    ------------------------------------------------------------------------------------------------------------------ No results for input(s): CHOL, HDL, LDLCALC, TRIG, CHOLHDL, LDLDIRECT in the last 72 hours.  Lab Results  Component Value Date   HGBA1C 6.1 (H) 06/01/2021    No  results for input(s): TSH, T4TOTAL, T3FREE, THYROIDAB in the last 72 hours.  Invalid input(s): FREET3 ------------------------------------------------------------------------------------------------------------------ ID Labs Recent Labs  Lab 06/13/21 2148 06/14/21 0206 06/15/21 1740 06/15/21 0839 06/16/21 0105 06/17/21 0405 06/18/21 0133 06/19/21 0135  WBC 9.9  --  16.7*  --   --  7.6 5.8 5.9  PLT 221  --  218  --   --  218 195 189  CRP  --   --   --   --   --  14.3* 10.1* 8.2*  PROCALCITON  --   --   --   --  4.53  --   --   --   LATICACIDVEN  --   --   --  1.2  --   --   --   --   CREATININE  --    < > 7.21*  --  5.36* 6.42*   6.31* 4.77*   4.70* 6.00*   < > = values in this interval not  displayed.   Cardiac Enzymes No results for input(s): CKMB, TROPONINI, MYOGLOBIN in the last 168 hours.  Invalid input(s): CK  Radiology Reports CT CHEST WO CONTRAST  Result Date: 06/16/2021 CLINICAL DATA:  66 year old male with history of pneumonia. EXAM: CT CHEST WITHOUT CONTRAST TECHNIQUE: Multidetector CT imaging of the chest was performed following the standard protocol without IV contrast. RADIATION DOSE REDUCTION: This exam was performed according to the departmental dose-optimization program which includes automated exposure control, adjustment of the mA and/or kV according to patient size and/or use of iterative reconstruction technique. COMPARISON:  Chest CT 09/23/2017. FINDINGS: Cardiovascular: Heart size is mildly enlarged. There is no significant pericardial fluid, thickening or pericardial calcification. There is aortic atherosclerosis, as well as atherosclerosis of the great vessels of the mediastinum and the coronary arteries, including calcified atherosclerotic plaque in the left main, left anterior descending, left circumflex and right coronary arteries. Calcifications of the aortic valve. Left atrial appendage occluding device noted. Mediastinum/Nodes: Mildly enlarged low right paratracheal lymph node measuring 1.7 cm in short axis (axial image 44 of series 3). No other pathologically enlarged mediastinal or hilar lymph nodes are confidently identified on today's noncontrast CT examination. Esophagus is unremarkable in appearance. No axillary lymphadenopathy. Lungs/Pleura: Diffuse ground-glass attenuation and mild interlobular septal thickening noted throughout the lungs bilaterally, suggesting a background of pulmonary edema. Moderate bilateral pleural effusions lying dependently with associated areas of passive subsegmental atelectasis in the lower lobes of the lungs bilaterally. There is a new thick-walled cavitary nodule in the right upper lobe (axial image 61 of series 4) measuring  1.4 x 1.2 cm. Upper Abdomen: Aortic atherosclerosis. Mild bilateral renal atrophy. Musculoskeletal: Diffuse sclerotic lesions noted throughout the visualized axial and appendicular skeleton, compatible with widespread metastatic disease in this patient with documented history of prostate cancer. IMPRESSION: 1. The appearance of the chest suggest congestive heart failure, as above. 2. In addition, there is a new thick-walled cavitary nodule measuring 1.4 x 1.2 cm in the right upper lobe. This is potentially infectious or inflammatory in etiology, however, underlying neoplasm is not excluded. Short-term follow-up repeat noncontrast chest CT is recommended in 2-3 months to ensure the stability or regression of this lesion. 3. Mildly enlarged right paratracheal lymph node, nonspecific in the setting of presumed congestive heart failure, but concerning in light of the right upper lobe pulmonary nodule. Attention at time of repeat CT examination is recommended to ensure stability/regression. 4. Diffuse sclerotic lesions noted throughout the visualized axial and appendicular skeleton,  presumably reflective of widespread osseous metastasis in this patient with history of prostate cancer. 5. Aortic atherosclerosis, in addition to left main and three-vessel coronary artery disease. Please note that although the presence of coronary artery calcium documents the presence of coronary artery disease, the severity of this disease and any potential stenosis cannot be assessed on this non-gated CT examination. Assessment for potential risk factor modification, dietary therapy or pharmacologic therapy may be warranted, if clinically indicated. 6. There are calcifications of the aortic valve. Echocardiographic correlation for evaluation of potential valvular dysfunction may be warranted if clinically indicated. Aortic Atherosclerosis (ICD10-I70.0). Electronically Signed   By: Vinnie Langton M.D.   On: 06/16/2021 11:59   DG CHEST  PORT 1 VIEW  Result Date: 06/16/2021 CLINICAL DATA:  Shortness of breath EXAM: PORTABLE CHEST 1 VIEW COMPARISON:  06/15/2021 FINDINGS: Cardiac shadow is enlarged. Aortic calcifications are again seen. Lungs are well aerated bilaterally. Persistent airspace opacity is again seen primarily within the right lung base similar to that seen on the prior exam. No bony abnormality is noted. IMPRESSION: Significant interval change from the prior study. Electronically Signed   By: Inez Catalina M.D.   On: 06/16/2021 03:59

## 2021-06-19 NOTE — Progress Notes (Signed)
OT Cancellation Note  Patient Details Name: Richard Hall MRN: [id number] DOB: November 06, 1955   Cancelled Treatment:    Reason Eval/Treat Not Completed: Patient at procedure or test/ unavailable. Pt at HD, OT will follow up next available time  [name] 06/19/2021, 9:57 AM

## 2021-06-19 NOTE — Progress Notes (Signed)
Patient refusing BiPAP for the night. 

## 2021-06-20 DIAGNOSIS — J9621 Acute and chronic respiratory failure with hypoxia: Secondary | ICD-10-CM | POA: Diagnosis not present

## 2021-06-20 LAB — CBC WITH DIFFERENTIAL/PLATELET
Abs Immature Granulocytes: 0.04 10*3/uL (ref 0.00–0.07)
Basophils Absolute: 0 10*3/uL (ref 0.0–0.1)
Basophils Relative: 1 %
Eosinophils Absolute: 0.1 10*3/uL (ref 0.0–0.5)
Eosinophils Relative: 2 %
HCT: 24.9 % — ABNORMAL LOW (ref 39.0–52.0)
Hemoglobin: 7.3 g/dL — ABNORMAL LOW (ref 13.0–17.0)
Immature Granulocytes: 1 %
Lymphocytes Relative: 10 %
Lymphs Abs: 0.6 10*3/uL — ABNORMAL LOW (ref 0.7–4.0)
MCH: 28 pg (ref 26.0–34.0)
MCHC: 29.3 g/dL — ABNORMAL LOW (ref 30.0–36.0)
MCV: 95.4 fL (ref 80.0–100.0)
Monocytes Absolute: 0.5 10*3/uL (ref 0.1–1.0)
Monocytes Relative: 8 %
Neutro Abs: 4.5 10*3/uL (ref 1.7–7.7)
Neutrophils Relative %: 78 %
Platelets: 179 10*3/uL (ref 150–400)
RBC: 2.61 MIL/uL — ABNORMAL LOW (ref 4.22–5.81)
RDW: 17.8 % — ABNORMAL HIGH (ref 11.5–15.5)
WBC: 5.8 10*3/uL (ref 4.0–10.5)
nRBC: 0 % (ref 0.0–0.2)

## 2021-06-20 LAB — CULTURE, BLOOD (ROUTINE X 2)
Culture: NO GROWTH
Culture: NO GROWTH
Special Requests: ADEQUATE
Special Requests: ADEQUATE

## 2021-06-20 LAB — HEPATIC FUNCTION PANEL
ALT: 5 U/L (ref 0–44)
AST: 24 U/L (ref 15–41)
Albumin: 2 g/dL — ABNORMAL LOW (ref 3.5–5.0)
Alkaline Phosphatase: 109 U/L (ref 38–126)
Bilirubin, Direct: <0.1 mg/dL (ref 0.0–0.2)
Total Bilirubin: 0.5 mg/dL (ref 0.3–1.2)
Total Protein: 6.2 g/dL — ABNORMAL LOW (ref 6.5–8.1)

## 2021-06-20 LAB — GLUCOSE, CAPILLARY
Glucose-Capillary: 110 mg/dL — ABNORMAL HIGH (ref 70–99)
Glucose-Capillary: 114 mg/dL — ABNORMAL HIGH (ref 70–99)
Glucose-Capillary: 126 mg/dL — ABNORMAL HIGH (ref 70–99)
Glucose-Capillary: 149 mg/dL — ABNORMAL HIGH (ref 70–99)

## 2021-06-20 LAB — RENAL FUNCTION PANEL
Albumin: 2 g/dL — ABNORMAL LOW (ref 3.5–5.0)
Anion gap: 10 (ref 5–15)
BUN: 24 mg/dL — ABNORMAL HIGH (ref 8–23)
CO2: 29 mmol/L (ref 22–32)
Calcium: 7.8 mg/dL — ABNORMAL LOW (ref 8.9–10.3)
Chloride: 95 mmol/L — ABNORMAL LOW (ref 98–111)
Creatinine, Ser: 4.04 mg/dL — ABNORMAL HIGH (ref 0.61–1.24)
GFR, Estimated: 16 mL/min — ABNORMAL LOW (ref >60)
Glucose, Bld: 134 mg/dL — ABNORMAL HIGH (ref 70–99)
Phosphorus: 4.3 mg/dL (ref 2.5–4.6)
Potassium: 4 mmol/L (ref 3.5–5.1)
Sodium: 134 mmol/L — ABNORMAL LOW (ref 135–145)

## 2021-06-20 LAB — QUANTIFERON-TB GOLD PLUS: QuantiFERON-TB Gold Plus: UNDETERMINED — AB

## 2021-06-20 LAB — QUANTIFERON-TB GOLD PLUS (RQFGPL)
QuantiFERON Mitogen Value: 0.32 IU/mL
QuantiFERON Nil Value: 0.03 IU/mL
QuantiFERON TB1 Ag Value: 0.12 IU/mL
QuantiFERON TB2 Ag Value: 0.12 IU/mL

## 2021-06-20 LAB — HEPATITIS B CORE ANTIBODY, TOTAL: Hep B Core Total Ab: NONREACTIVE

## 2021-06-20 LAB — HEPATITIS B SURFACE ANTIBODY,QUALITATIVE: Hep B S Ab: REACTIVE — AB

## 2021-06-20 LAB — C-REACTIVE PROTEIN: CRP: 6.2 mg/dL — ABNORMAL HIGH (ref <1.0)

## 2021-06-20 LAB — BRAIN NATRIURETIC PEPTIDE: B Natriuretic Peptide: 2017.9 pg/mL — ABNORMAL HIGH (ref 0.0–100.0)

## 2021-06-20 NOTE — Progress Notes (Signed)
Speech Language Pathology Treatment: Dysphagia  Patient Details Name: Richard Hall MRN: [id number] DOB: 1955/08/07 Today's Date: 06/20/2021 Time: 3976-7341 SLP Time Calculation (min) (ACUTE ONLY): 15 min  Assessment / Plan / Recommendation Clinical Impression  Patient seen by SLP with focus on dysphagia goals. Patient sitting up on EOB and had recently finished lunch meal. SLP assisted with repositioning pulse ox on finger as was getting inaccurate readings. Patient consumed a few sips of thin liquids and ate 1/2 cup of ice cream without any overt s/s aspiration or penetration. Patient does appear with some generalizd fatigue but was less flat in affect than he has been. He did report that he just wants to go home. SLP is recommending to continue on current Dys 3 solids, thin liquids diet and plan to f/u at least one more time to ensure toleration prior to discharge.    HPI HPI: 66 y.o. male with medical history significant of hypertension, atrial fibrillation, congestive heart failure, CVA, ESRD on HD, and prostate cancer. Recently d/c (admitted 12/20-1/13)from hospital to SNF and readmitted with acute respiratory failure due to overload. Patient had been seen by ST services during previous admission and at that time was tolerating a Dys 2, thin liquids diet; ST continuing to f/u for diet progression.       SLP Plan  Continue with current plan of care      Recommendations for follow up therapy are one component of a multi-disciplinary discharge planning process, led by the attending physician.  Recommendations may be updated based on patient status, additional functional criteria and insurance authorization.    Recommendations  Diet recommendations: Dysphagia 3 (mechanical soft);Thin liquid Liquids provided via: Cup;Straw Medication Administration: Whole meds with puree Supervision: Patient able to self feed;Intermittent supervision to cue for compensatory strategies Compensations:  Slow rate;Small sips/bites Postural Changes and/or Swallow Maneuvers: Seated upright 90 degrees                Oral Care Recommendations: Oral care BID;Staff/trained caregiver to provide oral care Follow Up Recommendations: Other (comment) (TBD) Assistance recommended at discharge: Intermittent Supervision/Assistance SLP Visit Diagnosis: Dysphagia, unspecified (R13.10) Plan: Continue with current plan of care          Sonia Baller, MA, CCC-SLP Speech Therapy

## 2021-06-20 NOTE — Telephone Encounter (Signed)
Patient has been scheduled for OV. Nothing further needed at this time.   Next Appt With Pulmonology Collene Gobble, MD) 07/30/2021 at 2:00 PM

## 2021-06-20 NOTE — Progress Notes (Signed)
[facility]  Date of Admission:  06/15/2021   Total days of inpatient antibiotics 2  Principal Problem:   Acute on chronic respiratory failure with hypoxia (HCC) Active Problems:   Essential hypertension   ESRD (end stage renal disease) on dialysis (HCC)   Pneumonia due to COVID-19 virus   Elevated troponin   Hyperkalemia   SIRS (systemic inflammatory response syndrome) (HCC)   Fluid overload          Assessment: 13 YM with ESRD on iHD via left arm fistula, 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 Found to be in Afib with rvr (Hx of Afib SP watchman), CHF exacerbation, COVID infection. Remdesivir was started, but stopped due to elevated LFTs. Hospital course complicated by fever, found to have MSSA bacteremia. ID was consulted and recommended 6 weeks of antibiotics with cefazolin for possible endovascular infection(etiology unclear).    # MSSA bacteremia, suspect hospital associated #COVID infection on 2L O2(baseline) #Cavitary nodule Developed SHOB, without fever or leukocytosis. CT on 1/15 showed 1.4x1.2 cm cavitary nodule in RLL and moderate b/l pleural effusion. Pulmonology engaged and Augmentin x 4-6 weeks for cavitary nodule.  Acute on chronic rep failure suspected due to pulmonary edema as pt improved after HD. -Agree that even with negative TEE, nodule is consistent with septic emboli. He could had a vegetation that embolized prior to TEE.  Regardless, he is on cefazolin which is appropriate in the setting of MSSA bacteremia and septic emboli to the lungs. Does not need double beta-lactam and anaerobic coverage.   Recommendations:  -D/C unasyn -Continue ancef with iHD to complete 6 weeks of antibiotics for MSSA bacteremia from negative blood Cx(EOT 07/17/21).  -Dental referal outpatient -ID will sign off    Microbiology:   Antibiotics: Vancomycin and cefepime 1/04-p Remdesivir 12/30-31      Cultures: Blood 06/05/21 2/2 MSSA   SUBJECTIVE: Pt resting in bed. No new complaints.   Interval: Afebrile overnight, wbc 5.8K.  Review of Systems: Review of Systems  All other systems reviewed and are negative.   Scheduled Meds:  amitriptyline  100 mg Oral QHS   apixaban  2.5 mg Oral BID   atorvastatin  80 mg Oral QPM   chlorhexidine  15 mL Mouth Rinse BID   Chlorhexidine Gluconate Cloth  6 each Topical Q0600   darbepoetin (ARANESP) injection - DIALYSIS  200 mcg Intravenous Q Wed-HD   enzalutamide  160 mg Oral QHS   insulin aspart  0-6 Units Subcutaneous TID WC   insulin glargine-yfgn  10 Units Subcutaneous QHS   levothyroxine  100 mcg Oral QAC breakfast   lidocaine-prilocaine  1 application Topical Q M,W,F   mouth rinse  15 mL Mouth Rinse q12n4p   metoprolol tartrate  12.5 mg Oral BID   midodrine  5 mg Oral TID WC   multivitamin with minerals  1 tablet Oral Daily   sertraline  100 mg Oral Daily   Continuous Infusions:  ampicillin-sulbactam (UNASYN) IV 3 g (06/20/21 1059)    ceFAZolin (ANCEF) IV Stopped (06/19/21 1145)   ferric gluconate (FERRLECIT) IVPB 250 mg (06/20/21 0825)   PRN Meds:.acetaminophen **OR** acetaminophen, albuterol, diphenoxylate-atropine, guaiFENesin, ondansetron (ZOFRAN) IV Allergies  Allergen Reactions   Gabapentin Swelling    OBJECTIVE: Vitals:   06/20/21 0000 06/20/21 0456 06/20/21 0501 06/20/21 0730  BP: 108/64 (!) 106/57  111/63  Pulse: 65 70  67  Resp: (!) 21 20 (!)  21 18  Temp: 98.9 F (37.2 C) 98.6 F (37 C)  97.6 F (36.4 C)  TempSrc: Axillary Axillary  Oral  SpO2: 97% 90%  99%  Weight:   118.8 kg   Height:       Body mass index is 35.52 kg/m.  Physical Exam Constitutional:      General: He is not in acute distress.    Appearance: He is normal weight. He is not toxic-appearing.  HENT:     Head: Normocephalic and atraumatic.     Right Ear: External ear normal.     Left Ear: External ear normal.     Nose: No congestion  or rhinorrhea.     Mouth/Throat:     Mouth: Mucous membranes are moist.     Pharynx: Oropharynx is clear.  Eyes:     Extraocular Movements: Extraocular movements intact.     Conjunctiva/sclera: Conjunctivae normal.     Pupils: Pupils are equal, round, and reactive to light.  Cardiovascular:     Rate and Rhythm: Normal rate and regular rhythm.     Heart sounds: No murmur heard.   No friction rub. No gallop.  Pulmonary:     Effort: Pulmonary effort is normal.     Breath sounds: Normal breath sounds.  Abdominal:     General: Abdomen is flat. Bowel sounds are normal.     Palpations: Abdomen is soft.  Musculoskeletal:        General: No swelling. Normal range of motion.     Cervical back: Normal range of motion and neck supple.  Skin:    General: Skin is warm and dry.  Neurological:     General: No focal deficit present.  Psychiatric:        Mood and Affect: Mood normal.      Lab Results Lab Results  Component Value Date   WBC 5.8 06/20/2021   HGB 7.3 (L) 06/20/2021   HCT 24.9 (L) 06/20/2021   MCV 95.4 06/20/2021   PLT 179 06/20/2021    Lab Results  Component Value Date   CREATININE 4.04 (H) 06/20/2021   BUN 24 (H) 06/20/2021   NA 134 (L) 06/20/2021   K 4.0 06/20/2021   CL 95 (L) 06/20/2021   CO2 29 06/20/2021    Lab Results  Component Value Date   ALT 5 06/20/2021   AST 24 06/20/2021   ALKPHOS 109 06/20/2021   BILITOT 0.5 06/20/2021        Laurice Record, [facility] 06/20/2021, 11:06 AM

## 2021-06-20 NOTE — Progress Notes (Signed)
[facility] Progress Note   Subjective:  Seen in room, lying flat in bed. No complaints. No cp/dyspnea. He's wondering when he will go home.  Completed dialysis yesterday -no issues.    Objective Vitals:   06/20/21 0000 06/20/21 0456 06/20/21 0501 06/20/21 0730  BP: 108/64 (!) 106/57  111/63  Pulse: 65 70  67  Resp: (!) 21 20 (!) 21 18  Temp: 98.9 F (37.2 C) 98.6 F (37 C)  97.6 F (36.4 C)  TempSrc: Axillary Axillary  Oral  SpO2: 97% 90%  99%  Weight:   118.8 kg   Height:          Additional Objective Labs: Basic Metabolic Panel: Recent Labs  Lab 06/18/21 0133 06/19/21 0135 06/20/21 0256  NA 132*   133* 130* 134*  K 3.9   3.9 3.9 4.0  CL 94*   95* 93* 95*  CO2 26   27 27 29   GLUCOSE 157*   155* 163* 134*  BUN 40*   40* 49* 24*  CREATININE 4.77*   4.70* 6.00* 4.04*  CALCIUM 7.6*   7.6* 7.7* 7.8*  PHOS 4.7* 5.9* 4.3    CBC: Recent Labs  Lab 06/15/21 0833 06/15/21 0843 06/17/21 0405 06/18/21 0133 06/19/21 0135 06/20/21 0256  WBC 16.7*  --  7.6 5.8 5.9 5.8  NEUTROABS 15.6*  --  6.2 4.7 4.7 4.5  HGB 8.6*   < > 7.4* 7.0* 7.2* 7.3*  HCT 28.1*   < > 24.6* 22.8* 24.7* 24.9*  MCV 91.5  --  91.8 90.8 94.6 95.4  PLT 218  --  218 195 189 179   < > = values in this interval not displayed.    Blood Culture    Component Value Date/Time   SDES BLOOD RIGHT ARM 06/15/2021 0919   SPECREQUEST  06/15/2021 0919    BOTTLES DRAWN AEROBIC AND ANAEROBIC Blood Culture adequate volume   CULT  06/15/2021 0919    NO GROWTH 5 DAYS Performed at [facility], [place] [address] [place], [place] [address]    REPTSTATUS 06/20/2021 FINAL 06/15/2021 0919     Physical Exam General: Lying flat in bed, nad.  Heart: RRR No m,r,g  Lungs: Clear bilaterally  Abdomen: obese, soft non-tender Extremities: 1+ L E edema L >R  Dialysis Access: LUE AVF +bruit   Medications:  ampicillin-sulbactam (UNASYN) IV 3 g (06/19/21 1233)    ceFAZolin (ANCEF) IV Stopped  (06/19/21 1145)   ferric gluconate (FERRLECIT) IVPB 250 mg (06/20/21 0825)    amitriptyline  100 mg Oral QHS   apixaban  2.5 mg Oral BID   atorvastatin  80 mg Oral QPM   chlorhexidine  15 mL Mouth Rinse BID   Chlorhexidine Gluconate Cloth  6 each Topical Q0600   darbepoetin (ARANESP) injection - DIALYSIS  200 mcg Intravenous Q Wed-HD   enzalutamide  160 mg Oral QHS   insulin aspart  0-6 Units Subcutaneous TID WC   insulin glargine-yfgn  10 Units Subcutaneous QHS   levothyroxine  100 mcg Oral QAC breakfast   lidocaine-prilocaine  1 application Topical Q M,W,F   mouth rinse  15 mL Mouth Rinse q12n4p   metoprolol tartrate  12.5 mg Oral BID   midodrine  5 mg Oral TID WC   multivitamin with minerals  1 tablet Oral Daily   sertraline  100 mg Oral Daily    Dialysis Orders:  [facility] MWF  4h  450 bfr  123kg  2/2 bath  LU AVF   Hep none  - mircera 225 mcg every 2 weeks - last received 200 mcg on 12/14  - venofer 100 mg (ordered for 10 doses)  - no vdra   Assessment/Plan: AHRF. Volume overload. Pulm edema on admit. Continue volume removal with HD. Now below OP dry weight. Will have lower EDW at discharge.  Recent COVID pna - was on vent 1/05 to 1/08. Unasyn started 1/16 ?aspiration PNA  ESRD - HD MWF.  Continue on schedule. Next HD 1/20 BP/ volume - Soft blood pressures this admission. Midordine 5mg  TID started. Volume improving, now below EDW.  Anemia ckd - Hgb 7.3>7.2 s/p prbcs last admit.  Transfuse Hgb <7. Continue Aranesp 200 q Wed.   Tsat 12% Ferritin 638- Getting IV Fe load here.  MBD ckd - cont auryxia ac.  Atrial Fibrillation w/ RVR - Rate controlled. On Eliquis, metoprolol  MSSA bacteremia. Prior to this admission. On cefazolin stop date 07/17/21.  New cavitary lesion in RUL on CT. Seen by PCCM and felt consistent with septic pulmonary embolus. Plan for 6 weeks Augmentin at discharge     [facility] 06/20/2021,10:29 AM

## 2021-06-20 NOTE — Progress Notes (Signed)
PROGRESS NOTE                                                                                                                                                                                                             Patient Demographics:    Richard Hall, is a 66 y.o. male, DOB - 08/23/1955, ACZ:[id number]  Outpatient Primary MD for the patient is [name], MD    LOS - 5  Admit date - 06/15/2021    Chief Complaint  Patient presents with   Shortness of Breath       Brief Narrative (HPI from H&P)    Richard Hall is a 66 y.o. male with medical history significant of hypertension, atrial fibrillation, congestive heart failure, CVA, ESRD on HD, and prostate cancer presents with complaints of shortness of breath.  Patient had just recently been hospitalized from 12/30-1/13 after initially presenting with septic shock related to MSSA bacteremia and COVID-19 viral pneumonia.  During his hospital stay patient required intubation.  ID had been consulted and patient was recommended to complete 6 weeks of cefazolin to be completed on 07/17/2021.  He went to the rehab facility but soon was sent back to the hospital for hypoxia, in the ER his work-up was suggestive of possible continued pneumonia, troponin, cardiology was consulted and he was admitted for further care.   Subjective:   Patient in bed, appears comfortable, denies any headache, no fever, no chest pain or pressure, no shortness of breath , no abdominal pain. No new focal weakness.   Assessment  & Plan :   Acute Hypoxic Resp. Failure due to Acute on chronic diastolic CHF in the presence of severe MR, EF 60% on recent echo with NSTEMI in a patient with ESRD and recent COVID-19 pneumonia with a new cavitary lesion on CT scan ?  Chronic intermittent microaspiration - no sepsis this admission, however does have leukocytosis with a cavitary lesion on CT scan raising the  suspicion for possible underlying aspiration, SLP following, also continue fluid removal through HD, renal and cardiology on board will also request pulmonary to opine on his cavitary lung lesion. CRP and procalcitonin also point towards a bacterial infectious etiology as well.  Cultures here this admission negative thus far.   Encouraged to sit up in chair in the daytime use I-S and flutter  valve for pulmonary toiletry.  Will advance activity and titrate down oxygen as possible.  He is on cefazolin for recent MSSA bacteremia with stop date of 07/17/2021 (pharm confirmed) will be on IV Unasyn here then 6 weeks of oral Augmentin upon discharge as recommended by pulmonary.  Appreciate pulmonary input as well.  This discharge will follow with pulmonary.  Due to MSSA bacteremia most likely will be discharged on combination of cefazolin and Flagyl 4-6 more weeks post discharge with outpatient pulmonary and ID follow-up.   2.  ESRD.  Renal on board for fluid removal through HD.  3.  Recent MSSA bacteremia.  Was on cefazolin stop date is 07/17/2021 currently on Unasyn, upon discharge plan is cefazolin and Flagyl for 4 more weeks.  4.  Dysphagia.  Speech therapy following currently on dysphagia 2 diet.  Kindly see #1 above.  5.  Hypotension.  Blood pressure has stabilized and improving titrate off midodrine.  6.  Obesity with OSA.  BMI 35 follow with PCP for weight loss nightly CPAP.  Counseled on compliance  7.  Recent left ankle fracture.  Nonweightbearing left leg per last admission, outpatient orthopedics follow-up.  PT OT.  8.  Hypothyroidism.  On Synthroid   9.  Chronic A. fib [place] vas 2 score of greater than 4.  On Eliquis continue low-dose beta-blocker as tolerated by blood pressure.    10.  AOCD.  Monitor, improving with fluid removal through HD, type screen done if drops below 7 transfuse, he has consented for transfusion if needed  11.  Dyslipidemia.  On Synthroid continue  12. History  of prostate cancer - Continue Xtandi.  13.  Elevated troponin.  Likely demand ischemia in the setting of ESRD, demand mismatch caused by hypoxia from #1 above, chest pain-free.  Cardiology on board, on Eliquis, statin, low-dose beta-blocker as tolerated by blood pressure and monitor  14.  Severe MR.  Outpatient cardiology follow-up.   15. DM type II.  On Lantus and sliding scale.  Monitor and adjust  Lab Results  Component Value Date   HGBA1C 6.1 (H) 06/01/2021    CBG (last 3)  Recent Labs    06/19/21 1617 06/19/21 2103 06/20/21 0728  GLUCAP 114* 196* 110*           Condition - Extremely Guarded  Family Communication  : [name] [phone] on 06/17/21, 06/18/21, 06/19/21  Code Status : Full code  Consults  : Cardiology, Pulmonary  PUD Prophylaxis :    Procedures  :     CT scan - 1. The appearance of the chest suggest congestive heart failure, as above. 2. In addition, there is a new thick-walled cavitary nodule measuring 1.4 x 1.2 cm in the right upper lobe. This is potentially infectious or inflammatory in etiology, however, underlying neoplasm is not excluded. Short-term follow-up repeat noncontrast chest CT is recommended in 2-3 months to ensure the stability or regression of this lesion. 3. Mildly enlarged right paratracheal lymph node, nonspecific in the setting of presumed congestive heart failure, but concerning in light of the right upper lobe pulmonary nodule. Attention at time of repeat CT examination is recommended to ensure stability/regression. 4. Diffuse sclerotic lesions noted throughout the visualized axial and appendicular skeleton, presumably reflective of widespread osseous metastasis in this patient with history of prostate cancer. 5. Aortic atherosclerosis, in addition to left main and three-vessel coronary artery disease. Please note that although the presence of coronary artery calcium documents the presence of coronary artery disease,  the severity of this  disease and any potential stenosis cannot be assessed on this non-gated CT examination. Assessment for potential risk factor modification, dietary therapy or pharmacologic therapy may be warranted, if clinically indicated. 6. There are calcifications of the aortic valve. Echocardiographic correlation for evaluation of potential valvular dysfunction may be warranted if clinically indicated. Aortic Atherosclerosis (ICD10-I70.0).      Disposition Plan  :    Status is: Inpatient  Remains inpatient appropriate because: Acute hypoxic respiratory failure  DVT Prophylaxis  :    apixaban (ELIQUIS) tablet 2.5 mg Start: 06/15/21 2200 apixaban (ELIQUIS) tablet 2.5 mg     Lab Results  Component Value Date   PLT 179 06/20/2021    Diet :  Diet Order             DIET DYS 3 Room service appropriate? Yes; Fluid consistency: Thin  Diet effective now                    Inpatient Medications  Scheduled Meds:  amitriptyline  100 mg Oral QHS   apixaban  2.5 mg Oral BID   atorvastatin  80 mg Oral QPM   chlorhexidine  15 mL Mouth Rinse BID   Chlorhexidine Gluconate Cloth  6 each Topical Q0600   darbepoetin (ARANESP) injection - DIALYSIS  200 mcg Intravenous Q Wed-HD   enzalutamide  160 mg Oral QHS   insulin aspart  0-6 Units Subcutaneous TID WC   insulin glargine-yfgn  10 Units Subcutaneous QHS   levothyroxine  100 mcg Oral QAC breakfast   lidocaine-prilocaine  1 application Topical Q M,W,F   mouth rinse  15 mL Mouth Rinse q12n4p   metoprolol tartrate  12.5 mg Oral BID   midodrine  5 mg Oral TID WC   multivitamin with minerals  1 tablet Oral Daily   sertraline  100 mg Oral Daily   Continuous Infusions:  ampicillin-sulbactam (UNASYN) IV 3 g (06/19/21 1233)    ceFAZolin (ANCEF) IV Stopped (06/19/21 1145)   ferric gluconate (FERRLECIT) IVPB 250 mg (06/20/21 0825)   PRN Meds:.acetaminophen **OR** acetaminophen, albuterol, diphenoxylate-atropine, guaiFENesin, ondansetron (ZOFRAN)  IV     Time Spent in minutes  30   Lala Lund M.D on 06/20/2021 at 9:58 AM  To page go to [email or web address]   [facility] -  Office  [phone]  See all Orders from today for further details    Objective:   Vitals:   06/20/21 0000 06/20/21 0456 06/20/21 0501 06/20/21 0730  BP: 108/64 (!) 106/57  111/63  Pulse: 65 70  67  Resp: (!) 21 20 (!) 21 18  Temp: 98.9 F (37.2 C) 98.6 F (37 C)  97.6 F (36.4 C)  TempSrc: Axillary Axillary  Oral  SpO2: 97% 90%  99%  Weight:   118.8 kg   Height:        Wt Readings from Last 3 Encounters:  06/20/21 118.8 kg  06/14/21 117.6 kg  07/26/20 125.3 kg     Intake/Output Summary (Last 24 hours) at 06/20/2021 0958 Last data filed at 06/19/2021 1200 Gross per 24 hour  Intake --  Output 1700 ml  Net -1700 ml     Physical Exam  Awake Alert, No new F.N deficits, Normal affect [place].AT,PERRAL Supple Neck, No JVD,   Symmetrical Chest wall movement, Good air movement bilaterally, few rales RRR,No Gallops, Rubs or new Murmurs,  +ve B.Sounds, Abd Soft, No tenderness,   No Cyanosis, Clubbing or edema   RN pressure  injury documentation: Pressure Injury 06/11/21 Coccyx Medial Stage 2 -  Partial thickness loss of dermis presenting as a shallow open injury with a red, pink wound bed without slough. (Active)  06/11/21 2159  Location: Coccyx  Location Orientation: Medial  Staging: Stage 2 -  Partial thickness loss of dermis presenting as a shallow open injury with a red, pink wound bed without slough.  Wound Description (Comments):   Present on Admission:      Data Review:    CBC Recent Labs  Lab 06/15/21 0833 06/15/21 0843 06/17/21 0405 06/18/21 0133 06/19/21 0135 06/20/21 0256  WBC 16.7*  --  7.6 5.8 5.9 5.8  HGB 8.6* 8.2* 7.4* 7.0* 7.2* 7.3*  HCT 28.1* 24.0* 24.6* 22.8* 24.7* 24.9*  PLT 218  --  218 195 189 179  MCV 91.5  --  91.8 90.8 94.6 95.4  MCH 28.0  --  27.6 27.9 27.6 28.0  MCHC 30.6  --  30.1 30.7 29.1*  29.3*  RDW 17.5*  --  17.3* 17.1* 17.6* 17.8*  LYMPHSABS 0.3*  --  0.7 0.5* 0.6* 0.6*  MONOABS 0.7  --  0.5 0.4 0.4 0.5  EOSABS 0.0  --  0.1 0.1 0.1 0.1  BASOSABS 0.0  --  0.0 0.0 0.0 0.0    Electrolytes Recent Labs  Lab 06/15/21 0833 06/15/21 0834 06/15/21 0839 06/15/21 0843 06/16/21 0105 06/17/21 0405 06/18/21 0133 06/19/21 0135 06/20/21 0256  NA 125*  --   --    < > 129* 127*   126* 132*   133* 130* 134*  K 5.6*  --   --    < > 4.0 4.1   4.1 3.9   3.9 3.9 4.0  CL 87*  --   --   --  91* 88*   87* 94*   95* 93* 95*  CO2 20*  --   --   --  25 24   25 26   27 27 29   GLUCOSE 208*  --   --   --  178* 161*   160* 157*   155* 163* 134*  BUN 96*  --   --   --  57* 72*   71* 40*   40* 49* 24*  CREATININE 7.21*  --   --   --  5.36* 6.42*   6.31* 4.77*   4.70* 6.00* 4.04*  CALCIUM 7.3*  --   --   --  7.6* 7.6*   7.7* 7.6*   7.6* 7.7* 7.8*  AST 27  --   --   --   --  17 18 27 24   ALT 5  --   --   --   --  <5 <5 <5 5  ALKPHOS 89  --   --   --   --  81 84 89 109  BILITOT 0.8  --   --   --   --  0.7 0.4 0.3 0.5  ALBUMIN 2.0*  --   --   --  2.1* 2.1*   2.1* 1.9*   2.0* 2.0*   2.0* 2.0*   2.0*  MG  --   --   --   --   --  2.4 2.3 2.4  --   CRP  --   --   --   --   --  14.3* 10.1* 8.2* 6.2*  PROCALCITON  --   --   --   --  4.53  --   --   --   --  LATICACIDVEN  --   --  1.2  --   --   --   --   --   --   BNP  --    < >  --   --  2,006.2* 2,107.4* 1,929.3* 1,938.6* 2,017.9*   < > = values in this interval not displayed.    ------------------------------------------------------------------------------------------------------------------ No results for input(s): CHOL, HDL, LDLCALC, TRIG, CHOLHDL, LDLDIRECT in the last 72 hours.  Lab Results  Component Value Date   HGBA1C 6.1 (H) 06/01/2021    No results for input(s): TSH, T4TOTAL, T3FREE, THYROIDAB in the last 72 hours.  Invalid input(s):  FREET3 ------------------------------------------------------------------------------------------------------------------ ID Labs Recent Labs  Lab 06/15/21 0833 06/15/21 0839 06/16/21 0105 06/17/21 0405 06/18/21 0133 06/19/21 0135 06/20/21 0256  WBC 16.7*  --   --  7.6 5.8 5.9 5.8  PLT 218  --   --  218 195 189 179  CRP  --   --   --  14.3* 10.1* 8.2* 6.2*  PROCALCITON  --   --  4.53  --   --   --   --   LATICACIDVEN  --  1.2  --   --   --   --   --   CREATININE 7.21*  --  5.36* 6.42*   6.31* 4.77*   4.70* 6.00* 4.04*   Cardiac Enzymes No results for input(s): CKMB, TROPONINI, MYOGLOBIN in the last 168 hours.  Invalid input(s): CK  Radiology Reports CT CHEST WO CONTRAST  Result Date: 06/16/2021 CLINICAL DATA:  66 year old male with history of pneumonia. EXAM: CT CHEST WITHOUT CONTRAST TECHNIQUE: Multidetector CT imaging of the chest was performed following the standard protocol without IV contrast. RADIATION DOSE REDUCTION: This exam was performed according to the departmental dose-optimization program which includes automated exposure control, adjustment of the mA and/or kV according to patient size and/or use of iterative reconstruction technique. COMPARISON:  Chest CT 09/23/2017. FINDINGS: Cardiovascular: Heart size is mildly enlarged. There is no significant pericardial fluid, thickening or pericardial calcification. There is aortic atherosclerosis, as well as atherosclerosis of the great vessels of the mediastinum and the coronary arteries, including calcified atherosclerotic plaque in the left main, left anterior descending, left circumflex and right coronary arteries. Calcifications of the aortic valve. Left atrial appendage occluding device noted. Mediastinum/Nodes: Mildly enlarged low right paratracheal lymph node measuring 1.7 cm in short axis (axial image 44 of series 3). No other pathologically enlarged mediastinal or hilar lymph nodes are confidently identified on today's  noncontrast CT examination. Esophagus is unremarkable in appearance. No axillary lymphadenopathy. Lungs/Pleura: Diffuse ground-glass attenuation and mild interlobular septal thickening noted throughout the lungs bilaterally, suggesting a background of pulmonary edema. Moderate bilateral pleural effusions lying dependently with associated areas of passive subsegmental atelectasis in the lower lobes of the lungs bilaterally. There is a new thick-walled cavitary nodule in the right upper lobe (axial image 61 of series 4) measuring 1.4 x 1.2 cm. Upper Abdomen: Aortic atherosclerosis. Mild bilateral renal atrophy. Musculoskeletal: Diffuse sclerotic lesions noted throughout the visualized axial and appendicular skeleton, compatible with widespread metastatic disease in this patient with documented history of prostate cancer. IMPRESSION: 1. The appearance of the chest suggest congestive heart failure, as above. 2. In addition, there is a new thick-walled cavitary nodule measuring 1.4 x 1.2 cm in the right upper lobe. This is potentially infectious or inflammatory in etiology, however, underlying neoplasm is not excluded. Short-term follow-up repeat noncontrast chest CT is recommended in 2-3 months to ensure the  stability or regression of this lesion. 3. Mildly enlarged right paratracheal lymph node, nonspecific in the setting of presumed congestive heart failure, but concerning in light of the right upper lobe pulmonary nodule. Attention at time of repeat CT examination is recommended to ensure stability/regression. 4. Diffuse sclerotic lesions noted throughout the visualized axial and appendicular skeleton, presumably reflective of widespread osseous metastasis in this patient with history of prostate cancer. 5. Aortic atherosclerosis, in addition to left main and three-vessel coronary artery disease. Please note that although the presence of coronary artery calcium documents the presence of coronary artery disease, the  severity of this disease and any potential stenosis cannot be assessed on this non-gated CT examination. Assessment for potential risk factor modification, dietary therapy or pharmacologic therapy may be warranted, if clinically indicated. 6. There are calcifications of the aortic valve. Echocardiographic correlation for evaluation of potential valvular dysfunction may be warranted if clinically indicated. Aortic Atherosclerosis (ICD10-I70.0). Electronically Signed   By: Vinnie Langton M.D.   On: 06/16/2021 11:59

## 2021-06-20 NOTE — Progress Notes (Signed)
Occupational Therapy Treatment Patient Details Name: Richard Hall MRN: [id number] DOB: Nov 25, 1955 Today's Date: 06/20/2021   History of present illness 66 y.o. male with medical history significant of hypertension, atrial fibrillation, congestive heart failure, CVA, ESRD on HD, and prostate cancer. Recently d/c from hospital to SNF and readmitted with acute respiratory failure due to overload. On prior admission, found to have chronic non-healed Lt femur facture despite ORIF, will request NWB status LLE.   OT comments  Pt making slow progress with functional goals. Session focused on sitting EOB, B E ROM exercises, UB dressing, grooming and sitting tolerance. OT will continue to follow cutely to maximize level of function and safety   Recommendations for follow up therapy are one component of a multi-disciplinary discharge planning process, led by the attending physician.  Recommendations may be updated based on patient status, additional functional criteria and insurance authorization.    Follow Up Recommendations  Skilled nursing-short term rehab (<3 hours/day)    Assistance Recommended at Discharge Frequent or constant Supervision/Assistance  Patient can return home with the following  Two people to help with walking and/or transfers;Two people to help with bathing/dressing/bathroom   Equipment Recommendations  Other (comment) (TBD at SNF)    Recommendations for Other Services      Precautions / Restrictions Precautions Precautions: Fall Restrictions Weight Bearing Restrictions: Yes LLE Weight Bearing: Non weight bearing       Mobility Bed Mobility Overal bed mobility: Needs Assistance Bed Mobility: Supine to Sit, Sit to Supine Rolling: Min assist   Supine to sit: Mod assist Sit to supine: Max assist   General bed mobility comments: cues for sequencing, HOB elevated, mod A to elevate trunk , max A with LEs back onto bed. Used bed controls to tilt back back for pt to  pull himself to [facility] using rails    Transfers                         Balance Overall balance assessment: Needs assistance Sitting-balance support: Feet supported, Single extremity supported, No upper extremity supported Sitting balance-Leahy Scale: Fair Sitting balance - Comments: pt fatiguing easily seated EOB                                   ADL either performed or assessed with clinical judgement   ADL Overall ADL's : Needs assistance/impaired     Grooming: Wash/dry hands;Wash/dry face;Brushing hair;Sitting;Minimal assistance           Upper Body Dressing : Moderate assistance;Sitting                     General ADL Comments: pt limited by cognition and significant weakness and poor activity tolerance    Extremity/Trunk Assessment Upper Extremity Assessment Upper Extremity Assessment: Generalized weakness   Lower Extremity Assessment Lower Extremity Assessment: Defer to PT evaluation        Vision Baseline Vision/History: 1 Wears glasses Ability to See in Adequate Light: 0 Adequate Patient Visual Report: No change from baseline     Perception     Praxis      Cognition Arousal/Alertness: Awake/alert Behavior During Therapy: Flat affect   Area of Impairment: Attention, Memory, Safety/judgement, Awareness                       Following Commands: Follows one step commands with increased time, Follows  one step commands inconsistently     Problem Solving: Slow processing, Decreased initiation, Requires tactile cues, Requires verbal cues, Difficulty sequencing          Exercises Other Exercises Other Exercises: Pt participated in B UE ROM exercises seated EOB    Shoulder Instructions       General Comments      Pertinent Vitals/ Pain       Pain Assessment Pain Assessment: Faces Faces Pain Scale: Hurts little more Facial Expression: Grimacing Pain Location: back Pain Descriptors / Indicators:  Discomfort, Grimacing Pain Intervention(s): Limited activity within patient's tolerance, Monitored during session, Repositioned  Home Living                                          Prior Functioning/Environment              Frequency  Min 2X/week        Progress Toward Goals  OT Goals(current goals can now be found in the care plan section)  Progress towards OT goals: Progressing toward goals     Plan Discharge plan remains appropriate;Frequency remains appropriate    Co-evaluation                 AM-PAC OT "6 Clicks" Daily Activity     Outcome Measure   Help from another person eating meals?: None Help from another person taking care of personal grooming?: A Little Help from another person toileting, which includes using toliet, bedpan, or urinal?: Total Help from another person bathing (including washing, rinsing, drying)?: A Lot Help from another person to put on and taking off regular upper body clothing?: A Lot Help from another person to put on and taking off regular lower body clothing?: Total 6 Click Score: 13    End of Session    OT Visit Diagnosis: Muscle weakness (generalized) (M62.81);Other symptoms and signs involving cognitive function;Other abnormalities of gait and mobility (R26.89);Pain Pain - part of body:  (back)   Activity Tolerance Patient limited by fatigue   Patient Left in bed;with bed alarm set;with call bell/phone within reach   Nurse Communication          Time: 8828-0034 OT Time Calculation (min): 20 min  Charges: OT General Charges $OT Visit: 1 Visit OT Treatments $Therapeutic Activity: 8-22 mins    [name] 06/20/2021, 3:33 PM

## 2021-06-21 LAB — CBC WITH DIFFERENTIAL/PLATELET
Abs Immature Granulocytes: 0.1 10*3/uL — ABNORMAL HIGH (ref 0.00–0.07)
Basophils Absolute: 0.1 10*3/uL (ref 0.0–0.1)
Basophils Relative: 1 %
Eosinophils Absolute: 0.1 10*3/uL (ref 0.0–0.5)
Eosinophils Relative: 1 %
HCT: 27 % — ABNORMAL LOW (ref 39.0–52.0)
Hemoglobin: 7.8 g/dL — ABNORMAL LOW (ref 13.0–17.0)
Immature Granulocytes: 1 %
Lymphocytes Relative: 9 %
Lymphs Abs: 0.6 10*3/uL — ABNORMAL LOW (ref 0.7–4.0)
MCH: 28 pg (ref 26.0–34.0)
MCHC: 28.9 g/dL — ABNORMAL LOW (ref 30.0–36.0)
MCV: 96.8 fL (ref 80.0–100.0)
Monocytes Absolute: 0.5 10*3/uL (ref 0.1–1.0)
Monocytes Relative: 7 %
Neutro Abs: 5.7 10*3/uL (ref 1.7–7.7)
Neutrophils Relative %: 81 %
Platelets: 184 10*3/uL (ref 150–400)
RBC: 2.79 MIL/uL — ABNORMAL LOW (ref 4.22–5.81)
RDW: 18.5 % — ABNORMAL HIGH (ref 11.5–15.5)
WBC: 7 10*3/uL (ref 4.0–10.5)
nRBC: 0.6 % — ABNORMAL HIGH (ref 0.0–0.2)

## 2021-06-21 LAB — HEPATIC FUNCTION PANEL
ALT: 5 U/L (ref 0–44)
AST: 29 U/L (ref 15–41)
Albumin: 2.2 g/dL — ABNORMAL LOW (ref 3.5–5.0)
Alkaline Phosphatase: 120 U/L (ref 38–126)
Bilirubin, Direct: <0.1 mg/dL (ref 0.0–0.2)
Total Bilirubin: 0.7 mg/dL (ref 0.3–1.2)
Total Protein: 6.6 g/dL (ref 6.5–8.1)

## 2021-06-21 LAB — RENAL FUNCTION PANEL
Albumin: 2.2 g/dL — ABNORMAL LOW (ref 3.5–5.0)
Anion gap: 13 (ref 5–15)
BUN: 31 mg/dL — ABNORMAL HIGH (ref 8–23)
CO2: 24 mmol/L (ref 22–32)
Calcium: 8.3 mg/dL — ABNORMAL LOW (ref 8.9–10.3)
Chloride: 95 mmol/L — ABNORMAL LOW (ref 98–111)
Creatinine, Ser: 5.21 mg/dL — ABNORMAL HIGH (ref 0.61–1.24)
GFR, Estimated: 11 mL/min — ABNORMAL LOW (ref >60)
Glucose, Bld: 138 mg/dL — ABNORMAL HIGH (ref 70–99)
Phosphorus: 4.9 mg/dL — ABNORMAL HIGH (ref 2.5–4.6)
Potassium: 4.4 mmol/L (ref 3.5–5.1)
Sodium: 132 mmol/L — ABNORMAL LOW (ref 135–145)

## 2021-06-21 LAB — GLUCOSE, CAPILLARY
Glucose-Capillary: 77 mg/dL (ref 70–99)
Glucose-Capillary: 106 mg/dL — ABNORMAL HIGH (ref 70–99)
Glucose-Capillary: 111 mg/dL — ABNORMAL HIGH (ref 70–99)

## 2021-06-21 LAB — HEPATITIS B SURFACE ANTIBODY,QUALITATIVE: Hep B S Ab: REACTIVE — AB

## 2021-06-21 LAB — HEPATITIS B E ANTIBODY: Hep B E Ab: NEGATIVE

## 2021-06-21 LAB — HEPATITIS B SURFACE ANTIGEN: Hepatitis B Surface Ag: NONREACTIVE

## 2021-06-21 LAB — BRAIN NATRIURETIC PEPTIDE: B Natriuretic Peptide: 1612 pg/mL — ABNORMAL HIGH (ref 0.0–100.0)

## 2021-06-21 LAB — C-REACTIVE PROTEIN: CRP: 6.2 mg/dL — ABNORMAL HIGH (ref <1.0)

## 2021-06-21 MED ORDER — HEPARIN SODIUM (PORCINE) 1000 UNIT/ML DIALYSIS: 1000.0000 [IU] | INTRAMUSCULAR | Status: DC | PRN

## 2021-06-21 MED ORDER — LIDOCAINE-PRILOCAINE 2.5-2.5 % EX CREA: 1.0000 "application " | TOPICAL_CREAM | CUTANEOUS | Status: DC | PRN

## 2021-06-21 MED ORDER — PENTAFLUOROPROP-TETRAFLUOROETH EX AERO: 1.0000 "application " | INHALATION_SPRAY | CUTANEOUS | Status: DC | PRN

## 2021-06-21 MED ORDER — LIDOCAINE HCL (PF) 1 % IJ SOLN: 5.0000 mL | INTRAMUSCULAR | Status: DC | PRN

## 2021-06-21 MED ORDER — HEPARIN SODIUM (PORCINE) 1000 UNIT/ML DIALYSIS: 2000.0000 [IU] | INTRAMUSCULAR | Status: DC | PRN

## 2021-06-21 MED ORDER — SODIUM CHLORIDE 0.9 % IV SOLN: 100.0000 mL | INTRAVENOUS | Status: DC | PRN

## 2021-06-21 MED ORDER — APIXABAN 5 MG PO TABS
5.0000 mg | ORAL_TABLET | Freq: Two times a day (BID) | ORAL | Status: DC
  Administered 2021-06-21 – 2021-06-24 (×7): 5 mg via ORAL
  Filled 2021-06-21 (×7): qty 1

## 2021-06-21 MED ORDER — ALTEPLASE 2 MG IJ SOLR: 2.0000 mg | Freq: Once | INTRAMUSCULAR | Status: DC | PRN

## 2021-06-21 NOTE — Progress Notes (Signed)
Physical Therapy Treatment Patient Details Name: Richard Hall MRN: [id number] DOB: Aug 08, 1955 Today's Date: 06/21/2021   History of Present Illness 67 y.o. male with medical history significant of hypertension, atrial fibrillation, congestive heart failure, CVA, ESRD on HD, and prostate cancer. Recently d/c from hospital to SNF and readmitted with acute respiratory failure due to overload. On prior admission, found to have chronic non-healed Lt femur facture despite ORIF, will request NWB status LLE.    PT Comments    Pt with limited progress toward goals, significantly impacted by L LE NWB status.  Due to significant fatigue post HD, emphasis on strengthening/warm up UE and LE exercise, transition to EOB with work on scooting forward and side ways in prep for squat pivot to the chair.  Pt to ask to get OOB with/without lift equipment this w/e.    Recommendations for follow up therapy are one component of a multi-disciplinary discharge planning process, led by the attending physician.  Recommendations may be updated based on patient status, additional functional criteria and insurance authorization.  Follow Up Recommendations  Skilled nursing-short term rehab (<3 hours/day)     Assistance Recommended at Discharge Frequent or constant Supervision/Assistance  Patient can return home with the following A lot of help with walking and/or transfers;Two people to help with walking and/or transfers;A lot of help with bathing/dressing/bathroom;Two people to help with bathing/dressing/bathroom;Assistance with cooking/housework;Assist for transportation;Help with stairs or ramp for entrance   Equipment Recommendations  None recommended by PT;Other (comment) (TBD if able to get home)    Recommendations for Other Services       Precautions / Restrictions Precautions Precautions: Fall Restrictions LLE Weight Bearing: Non weight bearing     Mobility  Bed Mobility Overal bed mobility: Needs  Assistance Bed Mobility: Supine to Sit     Supine to sit: Mod assist     General bed mobility comments: up via L elbow and then pt scooted with time to EOB    Transfers Overall transfer level: Needs assistance Equipment used: None Transfers: Bed to chair/wheelchair/BSC       Squat pivot transfers: Mod assist, +2 safety/equipment     General transfer comment: Even fatigued post HD, pt able to assist with R LE significantly to pivot with heavy mod assist, +2 on standby  for safety.  Pt on lift pad for assist return to bed by nursing staff.    Ambulation/Gait               General Gait Details: Unable   Stairs             Wheelchair Mobility    Modified Rankin (Stroke Patients Only)       Balance Overall balance assessment: Needs assistance Sitting-balance support: Feet supported, No upper extremity supported Sitting balance-Leahy Scale: Fair                                      Cognition Arousal/Alertness: Awake/alert Behavior During Therapy: Flat affect Overall Cognitive Status:  (NT formally, likely near baseline)                                          Exercises Other Exercises Other Exercises: bicep/tricep press bil with graded resistance x 10 reps for strength and warm up Other Exercises: bil hip/knee flex/ext ROM,  AROM L LE, graded resistance to R LE x 10 reps each, hip ab/ad bil x10 reps.    General Comments General comments (skin integrity, edema, etc.): vss on 3 L [place]      Pertinent Vitals/Pain Pain Assessment Pain Assessment: Faces Faces Pain Scale: Hurts little more Pain Location: lateral L thigh Pain Intervention(s): Limited activity within patient's tolerance, Monitored during session    Home Living                          Prior Function            PT Goals (current goals can now be found in the care plan section) Acute Rehab PT Goals Patient Stated Goal: Get well PT Goal  Formulation: With patient Time For Goal Achievement: 06/30/21 Potential to Achieve Goals: Fair Progress towards PT goals: Progressing toward goals    Frequency    Min 2X/week      PT Plan Current plan remains appropriate    Co-evaluation              AM-PAC PT "6 Clicks" Mobility   Outcome Measure  Help needed turning from your back to your side while in a flat bed without using bedrails?: A Lot Help needed moving from lying on your back to sitting on the side of a flat bed without using bedrails?: A Lot Help needed moving to and from a bed to a chair (including a wheelchair)?: Total Help needed standing up from a chair using your arms (e.g., wheelchair or bedside chair)?: Total Help needed to walk in hospital room?: Total Help needed climbing 3-5 steps with a railing? : Total 6 Click Score: 8    End of Session   Activity Tolerance: Patient tolerated treatment well;Patient limited by fatigue Patient left: in chair;with call bell/phone within reach;Other (comment) (on Lift pad) Nurse Communication: Mobility status;Need for lift equipment;Precautions;Weight bearing status PT Visit Diagnosis: Other abnormalities of gait and mobility (R26.89);Muscle weakness (generalized) (M62.81);Other (comment);Pain (L LE NWB status) Pain - Right/Left: Left Pain - part of body: Leg     Time: 1439-1500 PT Time Calculation (min) (ACUTE ONLY): 21 min  Charges:  $Therapeutic Activity: 8-22 mins                     06/21/2021  Ginger Carne., PT Acute Rehabilitation Services [phone]  (pager) [phone]  (office)   [name] 06/21/2021, 3:15 PM

## 2021-06-21 NOTE — Progress Notes (Signed)
MEDICATION RELATED -PHARMACY   Anticoagulation:  65y.o male on Apixaban 2.5mg  BID for h/o afib. Dose was  resumed on admission 06/15/21.  Recent hospitalization 05/31/21-06/14/21, noted on Apixaban 2.5 mg BID prior to admission.   Noted h/o ESRD- on HD MWF. The  Apixaban dose was increased to 5mg  bid due to patient qualified for 5mg  bid  for Afib secondary age <80 and weight >60kg. However, on  discharge to SNF on 1/13, MD resumed the lower 2.5 mg BID dose.  He was readmitted 1/14 (<24 hr post discharge) on  lower dose apixaban 2.5mg  BID. Pharmacist discussed dosing with  Dr. Candiss Norse on 1/16 and 1/18,  who wanted to keep Apixaban dose at 2.5 bid at that time due to Hgb trending down in 7s and fluid overload, with plan to watch and if Hgb improves , consider  increase dose.  Today 06/21/21 , his  Hgb improved  up to 7.8, trending up 7.2>7.3 > 7.8 . PLTC  wnl / stable.  No bleeding reported.   I discussed dosing with Dr. Candiss Norse and he agrees to increase Apixaban to 5 mg BID.   Discharge dose should be 5 mg BID.   Thank you for allowing pharmacy to be part of this patients care team. Nicole Cella, [place] Pharmacist [phone] 06/21/2021 1:09 PM

## 2021-06-21 NOTE — Plan of Care (Signed)

## 2021-06-21 NOTE — Progress Notes (Signed)
[facility] Progress Note   Subjective:    Seen and examined patient in HD. Currently tolerating UFG 2.5L. Denies SOB, CP, ABD pain, and N/V.  Objective Vitals:   06/21/21 0737 06/21/21 0743 06/21/21 0800 06/21/21 0830  BP: (!) 107/53 (!) 113/56 102/73 106/84  Pulse:      Resp: 18 17 18 20   Temp: (!) 96.9 F (36.1 C)     TempSrc:      SpO2: 96%     Weight: 119 kg     Height:       Physical Exam General: Lying in bed; NAD Heart: Normal S1 and S2; No murmurs, gallops, or rubs Lungs: Clear anteriorly and laterally; No wheezing, rales, or rhonchi Abdomen: Large, soft, and non-tender Extremities: 1+ edema noted LLE, no edema bilateral thighs or RLE Dialysis Access:  L AVF (+) Bruit/Thrill   Filed Weights   06/20/21 0501 06/21/21 0540 06/21/21 0737  Weight: 118.8 kg 119.8 kg 119 kg    Intake/Output Summary (Last 24 hours) at 06/21/2021 0837 Last data filed at 06/20/2021 2000 Gross per 24 hour  Intake 1031.48 ml  Output --  Net 1031.48 ml    Additional Objective Labs: Basic Metabolic Panel: Recent Labs  Lab 06/19/21 0135 06/20/21 0256 06/21/21 0120  NA 130* 134* 132*  K 3.9 4.0 4.4  CL 93* 95* 95*  CO2 27 29 24   GLUCOSE 163* 134* 138*  BUN 49* 24* 31*  CREATININE 6.00* 4.04* 5.21*  CALCIUM 7.7* 7.8* 8.3*  PHOS 5.9* 4.3 4.9*   Liver Function Tests: Recent Labs  Lab 06/19/21 0135 06/20/21 0256 06/21/21 0120  AST 27 24 29   ALT 5 5 5   ALKPHOS 89 109 120  BILITOT 0.3 0.5 0.7  PROT 6.4* 6.2* 6.6  ALBUMIN 2.0*   2.0* 2.0*   2.0* 2.2*   2.2*   No results for input(s): LIPASE, AMYLASE in the last 168 hours. CBC: Recent Labs  Lab 06/17/21 0405 06/18/21 0133 06/19/21 0135 06/20/21 0256 06/21/21 0147  WBC 7.6 5.8 5.9 5.8 7.0  NEUTROABS 6.2 4.7 4.7 4.5 5.7  HGB 7.4* 7.0* 7.2* 7.3* 7.8*  HCT 24.6* 22.8* 24.7* 24.9* 27.0*  MCV 91.8 90.8 94.6 95.4 96.8  PLT 218 195 189 179 184   Blood Culture    Component Value Date/Time   SDES BLOOD  RIGHT ARM 06/15/2021 0919   SPECREQUEST  06/15/2021 0919    BOTTLES DRAWN AEROBIC AND ANAEROBIC Blood Culture adequate volume   CULT  06/15/2021 0919    NO GROWTH 5 DAYS Performed at [facility], [place] [address], [place], [place] [address]    REPTSTATUS 06/20/2021 FINAL 06/15/2021 0919    Cardiac Enzymes: No results for input(s): CKTOTAL, CKMB, CKMBINDEX, TROPONINI in the last 168 hours. CBG: Recent Labs  Lab 06/19/21 2103 06/20/21 0728 06/20/21 1141 06/20/21 1651 06/20/21 2124  GLUCAP 196* 110* 114* 126* 149*   Iron Studies: No results for input(s): IRON, TIBC, TRANSFERRIN, FERRITIN in the last 72 hours. Lab Results  Component Value Date   INR 1.2 05/19/2019   INR 1.2 05/18/2019   INR 1.2 05/17/2019   Studies/Results: No results found.  Medications:  [START ON 06/22/2021] sodium chloride     [START ON 06/22/2021] sodium chloride      ceFAZolin (ANCEF) IV Stopped (06/19/21 1145)   ferric gluconate (FERRLECIT) IVPB Stopped (06/20/21 1025)    amitriptyline  100 mg Oral QHS   apixaban  2.5 mg Oral BID   atorvastatin  80 mg Oral QPM   chlorhexidine  15 mL Mouth Rinse BID   Chlorhexidine Gluconate Cloth  6 each Topical Q0600   darbepoetin (ARANESP) injection - DIALYSIS  200 mcg Intravenous Q Wed-HD   insulin aspart  0-6 Units Subcutaneous TID WC   insulin glargine-yfgn  10 Units Subcutaneous QHS   levothyroxine  100 mcg Oral QAC breakfast   lidocaine-prilocaine  1 application Topical Q M,W,F   mouth rinse  15 mL Mouth Rinse q12n4p   metoprolol tartrate  12.5 mg Oral BID   midodrine  5 mg Oral TID WC   multivitamin with minerals  1 tablet Oral Daily   sertraline  100 mg Oral Daily    Dialysis Orders: [facility] MWF  4h  450 bfr  123kg  2/2 bath  LU AVF   Hep none  - mircera 225 mcg every 2 weeks - last received 200 mcg on 12/14  - venofer 100 mg (ordered for 10 doses)  - no vdra   Assessment/Plan: AHRF. Volume overload. Pulm edema on admit. Continue volume  removal with HD. Now below OP dry weight. Will have lower EDW at discharge.  Recent COVID pna - was on vent 1/05 to 1/08. Unasyn started 1/16 ?aspiration PNA  ESRD - HD MWF.  Continue on schedule. On HD-UFG as tolerated. BP/ volume - Soft blood pressures this admission. Midordine 5mg  TID started. Volume improving, now below EDW.  Anemia ckd - Hgb now 7.8 s/p prbcs last admit.  Transfuse Hgb <7. Continue Aranesp 200 q Wed.   Tsat 12% Ferritin 638- Getting IV Fe load here.  MBD ckd - cont auryxia ac.  Atrial Fibrillation w/ RVR - Rate controlled. On Eliquis, metoprolol  MSSA bacteremia. Prior to this admission. On cefazolin stop date 07/17/21.  New cavitary lesion in RUL on CT. Seen by PCCM and felt consistent with septic pulmonary embolus. Plan for 6 weeks Augmentin at discharge   Tobie Poet, NP [facility] 06/21/2021,8:37 AM  LOS: 6 days

## 2021-06-21 NOTE — Progress Notes (Signed)
PROGRESS NOTE                                                                                                                                                                                                             Patient Demographics:    Richard Hall, is a 66 y.o. male, DOB - 06/11/1955, JKD:[id number]  Outpatient Primary MD for the patient is [name], MD    LOS - 6  Admit date - 06/15/2021    Chief Complaint  Patient presents with   Shortness of Breath       Brief Narrative (HPI from H&P)    Richard Hall is a 66 y.o. male with medical history significant of hypertension, atrial fibrillation, congestive heart failure, CVA, ESRD on HD, and prostate cancer presents with complaints of shortness of breath.  Patient had just recently been hospitalized from 12/30-1/13 after initially presenting with septic shock related to MSSA bacteremia and COVID-19 viral pneumonia.  During his hospital stay patient required intubation.  ID had been consulted and patient was recommended to complete 6 weeks of cefazolin to be completed on 07/17/2021.  He went to the rehab facility but soon was sent back to the hospital for hypoxia, in the ER his work-up was suggestive of possible continued pneumonia, troponin, cardiology was consulted and he was admitted for further care.   Subjective:   Patient in bed, appears comfortable, denies any headache, no fever, no chest pain or pressure, no shortness of breath , no abdominal pain. No new focal weakness.    Assessment  & Plan :   Acute Hypoxic Resp. Failure due to Acute on chronic diastolic CHF in the presence of severe MR, EF 60% on recent echo with NSTEMI in a patient with ESRD and recent COVID-19 pneumonia with a new cavitary lesion on CT scan ?  Chronic intermittent microaspiration - no sepsis this admission, however does have leukocytosis with a cavitary lesion on CT scan raising the  suspicion for possible underlying aspiration, SLP following, also continue fluid removal through HD, renal and cardiology on board will also request pulmonary to opine on his cavitary lung lesion. CRP and procalcitonin also point towards a bacterial infectious etiology as well.  He was briefly placed on Unasyn with excellent clinical improvement, cultures here this admission negative thus far.  Have been consulted  ID discussed the plan with pulmonary on 06/20/2021 plan is to continue cefazolin till 07/17/2021 thereafter outpatient ID and pulmonary follow-up.  Unasyn has been stopped &Cefazolin has been resumed.   Encouraged to sit up in chair in the daytime use I-S and flutter valve for pulmonary toiletry.  Will advance activity and titrate down oxygen as possible.  He is on cefazolin for recent MSSA bacteremia with stop date of 07/17/2021 (pharm confirmed)    2.  ESRD.  Renal on board for fluid removal through HD.  3.  Recent MSSA bacteremia.  Cefazolin with a stop date of 07/17/2021 seen by ID this admission.  4.  Dysphagia.  Speech therapy following currently on dysphagia 2 diet.  Kindly see #1 above.  5.  Hypotension.  Blood pressure has stabilized and improving titrate off midodrine.  6.  Obesity with OSA.  BMI 35 follow with PCP for weight loss nightly CPAP.  Counseled on compliance  7.  Recent left ankle fracture.  Nonweightbearing left leg per last admission, outpatient orthopedics follow-up.  PT OT.  8.  Hypothyroidism.  On Synthroid   9.  Chronic A. fib [place] vas 2 score of greater than 4.  On Eliquis continue low-dose beta-blocker as tolerated by blood pressure.    10.  AOCD.  Monitor, improving with fluid removal through HD, type screen done if drops below 7 transfuse, he has consented for transfusion if needed  11.  Dyslipidemia.  On Synthroid continue  12. History of prostate cancer - Continue Xtandi.  13.  Elevated troponin.  Likely demand ischemia in the setting of ESRD,  demand mismatch caused by hypoxia from #1 above, chest pain-free.  Cardiology on board, on Eliquis, statin, low-dose beta-blocker as tolerated by blood pressure and monitor  14.  Severe MR.  Outpatient cardiology follow-up.   15. DM type II.  On Lantus and sliding scale.  Monitor and adjust  Lab Results  Component Value Date   HGBA1C 6.1 (H) 06/01/2021    CBG (last 3)  Recent Labs    06/20/21 1141 06/20/21 1651 06/20/21 2124  GLUCAP 114* 126* 149*           Condition - Extremely Guarded  Family Communication  : [name] [phone] on 06/17/21, 06/18/21, 06/19/21  Code Status : Full code  Consults  : Cardiology, Pulmonary  PUD Prophylaxis :    Procedures  :     CT scan - 1. The appearance of the chest suggest congestive heart failure, as above. 2. In addition, there is a new thick-walled cavitary nodule measuring 1.4 x 1.2 cm in the right upper lobe. This is potentially infectious or inflammatory in etiology, however, underlying neoplasm is not excluded. Short-term follow-up repeat noncontrast chest CT is recommended in 2-3 months to ensure the stability or regression of this lesion. 3. Mildly enlarged right paratracheal lymph node, nonspecific in the setting of presumed congestive heart failure, but concerning in light of the right upper lobe pulmonary nodule. Attention at time of repeat CT examination is recommended to ensure stability/regression. 4. Diffuse sclerotic lesions noted throughout the visualized axial and appendicular skeleton, presumably reflective of widespread osseous metastasis in this patient with history of prostate cancer. 5. Aortic atherosclerosis, in addition to left main and three-vessel coronary artery disease. Please note that although the presence of coronary artery calcium documents the presence of coronary artery disease, the severity of this disease and any potential stenosis cannot be assessed on this non-gated CT examination. Assessment for potential  risk factor modification, dietary therapy or pharmacologic therapy may be warranted, if clinically indicated. 6. There are calcifications of the aortic valve. Echocardiographic correlation for evaluation of potential valvular dysfunction may be warranted if clinically indicated. Aortic Atherosclerosis (ICD10-I70.0).      Disposition Plan  :    Status is: Inpatient  Remains inpatient appropriate because: Acute hypoxic respiratory failure  DVT Prophylaxis  :    apixaban (ELIQUIS) tablet 2.5 mg Start: 06/15/21 2200 apixaban (ELIQUIS) tablet 2.5 mg     Lab Results  Component Value Date   PLT 184 06/21/2021    Diet :  Diet Order             DIET DYS 3 Room service appropriate? Yes; Fluid consistency: Thin  Diet effective now                    Inpatient Medications  Scheduled Meds:  amitriptyline  100 mg Oral QHS   apixaban  2.5 mg Oral BID   atorvastatin  80 mg Oral QPM   chlorhexidine  15 mL Mouth Rinse BID   Chlorhexidine Gluconate Cloth  6 each Topical Q0600   darbepoetin (ARANESP) injection - DIALYSIS  200 mcg Intravenous Q Wed-HD   insulin aspart  0-6 Units Subcutaneous TID WC   insulin glargine-yfgn  10 Units Subcutaneous QHS   levothyroxine  100 mcg Oral QAC breakfast   lidocaine-prilocaine  1 application Topical Q M,W,F   mouth rinse  15 mL Mouth Rinse q12n4p   metoprolol tartrate  12.5 mg Oral BID   midodrine  5 mg Oral TID WC   multivitamin with minerals  1 tablet Oral Daily   sertraline  100 mg Oral Daily   Continuous Infusions:  [START ON 06/22/2021] sodium chloride     [START ON 06/22/2021] sodium chloride      ceFAZolin (ANCEF) IV Stopped (06/19/21 1145)   ferric gluconate (FERRLECIT) IVPB Stopped (06/20/21 1025)   PRN Meds:.[START ON 06/22/2021] sodium chloride, [START ON 06/22/2021] sodium chloride, acetaminophen **OR** acetaminophen, albuterol, [START ON 06/22/2021] alteplase, diphenoxylate-atropine, guaiFENesin, [START ON 06/22/2021] heparin,  heparin, [START ON 06/22/2021] lidocaine (PF), [START ON 06/22/2021] lidocaine-prilocaine, ondansetron (ZOFRAN) IV, [START ON 06/22/2021] pentafluoroprop-tetrafluoroeth     Time Spent in minutes  30   Lala Lund M.D on 06/21/2021 at 8:30 AM  To page go to [email or web address]   [facility] -  Office  [phone]  See all Orders from today for further details    Objective:   Vitals:   06/21/21 0540 06/21/21 0737 06/21/21 0743 06/21/21 0800  BP:  (!) 107/53 (!) 113/56 102/73  Hall:      Resp:  18 17 18   Temp:  (!) 96.9 F (36.1 C)    TempSrc:      SpO2:  96%    Weight: 119.8 kg 119 kg    Height:        Wt Readings from Last 3 Encounters:  06/21/21 119 kg  06/14/21 117.6 kg  07/26/20 125.3 kg     Intake/Output Summary (Last 24 hours) at 06/21/2021 0830 Last data filed at 06/20/2021 2000 Gross per 24 hour  Intake 1031.48 ml  Output --  Net 1031.48 ml     Physical Exam  Awake Alert, No new F.N deficits, Normal affect [place].AT,PERRAL Supple Neck, No JVD,   Symmetrical Chest wall movement, Good air movement bilaterally, CTAB RRR,No Gallops, Rubs or new Murmurs,  +ve B.Sounds, Abd Soft, No tenderness,   No Cyanosis, Clubbing or  edema    RN pressure injury documentation: Pressure Injury 06/11/21 Coccyx Medial Stage 2 -  Partial thickness loss of dermis presenting as a shallow open injury with a red, pink wound bed without slough. (Active)  06/11/21 2159  Location: Coccyx  Location Orientation: Medial  Staging: Stage 2 -  Partial thickness loss of dermis presenting as a shallow open injury with a red, pink wound bed without slough.  Wound Description (Comments):   Present on Admission:      Data Review:   Recent Labs  Lab 06/17/21 0405 06/18/21 0133 06/19/21 0135 06/20/21 0256 06/21/21 0147  WBC 7.6 5.8 5.9 5.8 7.0  HGB 7.4* 7.0* 7.2* 7.3* 7.8*  HCT 24.6* 22.8* 24.7* 24.9* 27.0*  PLT 218 195 189 179 184  MCV 91.8 90.8 94.6 95.4 96.8  MCH 27.6 27.9  27.6 28.0 28.0  MCHC 30.1 30.7 29.1* 29.3* 28.9*  RDW 17.3* 17.1* 17.6* 17.8* 18.5*  LYMPHSABS 0.7 0.5* 0.6* 0.6* 0.6*  MONOABS 0.5 0.4 0.4 0.5 0.5  EOSABS 0.1 0.1 0.1 0.1 0.1  BASOSABS 0.0 0.0 0.0 0.0 0.1    Recent Labs  Lab 06/15/21 0839 06/15/21 0843 06/16/21 0105 06/17/21 0405 06/18/21 0133 06/19/21 0135 06/20/21 0256 06/21/21 0120 06/21/21 0146 06/21/21 0147  NA  --    < > 129* 127*   126* 132*   133* 130* 134* 132*  --   --   K  --    < > 4.0 4.1   4.1 3.9   3.9 3.9 4.0 4.4  --   --   CL  --   --  91* 88*   87* 94*   95* 93* 95* 95*  --   --   CO2  --   --  25 24   25 26   27 27 29 24   --   --   GLUCOSE  --   --  178* 161*   160* 157*   155* 163* 134* 138*  --   --   BUN  --   --  57* 72*   71* 40*   40* 49* 24* 31*  --   --   CREATININE  --   --  5.36* 6.42*   6.31* 4.77*   4.70* 6.00* 4.04* 5.21*  --   --   CALCIUM  --   --  7.6* 7.6*   7.7* 7.6*   7.6* 7.7* 7.8* 8.3*  --   --   AST  --   --   --  17 18 27 24 29   --   --   ALT  --   --   --  <5 <5 5 5 5   --   --   ALKPHOS  --   --   --  81 84 89 109 120  --   --   BILITOT  --   --   --  0.7 0.4 0.3 0.5 0.7  --   --   ALBUMIN  --   --  2.1* 2.1*   2.1* 1.9*   2.0* 2.0*   2.0* 2.0*   2.0* 2.2*   2.2*  --   --   MG  --   --   --  2.4 2.3 2.4  --   --   --   --   CRP  --   --   --  14.3* 10.1* 8.2* 6.2*  --   --  6.2*  PROCALCITON  --   --  4.53  --   --   --   --   --   --   --   LATICACIDVEN 1.2  --   --   --   --   --   --   --   --   --   BNP  --   --  2,006.2* 2,107.4* 1,929.3* 1,938.6* 2,017.9*  --  1,612.0*  --    < > = values in this interval not displayed.         Radiology Reports No results found.

## 2021-06-21 NOTE — TOC Initial Note (Signed)
Transition of Care [facility]) - Initial/Assessment Note    Patient Details  Name: Richard Hall MRN: [id number] Date of Birth: [birth year]  Transition of Care [facility]) CM/SW Contact:    Benard Halsted, LCSW Phone Number: 06/21/2021, 5:34 PM  Clinical Narrative:                 CSW continuing to follow for discharge back to [facility] when medically stable.   Expected Discharge Plan: Skilled Nursing Facility Barriers to Discharge: Continued Medical Work up   Patient Goals and CMS Choice Patient states their goals for this hospitalization and ongoing recovery are:: Rehab CMS Medicare.gov Compare Post Acute Care list provided to:: Patient Represenative (must comment) Choice offered to / list presented to : Adult Children  Expected Discharge Plan and Services Expected Discharge Plan: [place] In-house Referral: Clinical Social Work   Post Acute Care Choice: [place] Living arrangements for the past 2 months: [place]                                      Prior Living Arrangements/Services Living arrangements for the past 2 months: Single Family Home Lives with:: Self Patient language and need for interpreter reviewed:: Yes Do you feel safe going back to the place where you live?: Yes      Need for Family Participation in Patient Care: Yes (Comment) Care giver support system in place?: Yes (comment) Current home services: DME Criminal Activity/Legal Involvement Pertinent to Current Situation/Hospitalization: No - Comment as needed  Activities of Daily Living      Permission Sought/Granted Permission sought to share information with : Facility Sport and exercise psychologist, Family Supports Permission granted to share information with : Yes, Verbal Permission Granted  Share Information with NAME: [name]  Permission granted to share info w AGENCY: [place] granted to share info w Relationship: Son  Permission granted to share info w  Contact Information: [phone]  Emotional Assessment Appearance:: Appears stated age     Orientation: : Oriented to Self, Oriented to Place, Oriented to  Time, Oriented to Situation Alcohol / Substance Use: Not Applicable Psych Involvement: No (comment)  Admission diagnosis:  Acute on chronic respiratory failure with hypoxia ([place]) [J96.21] Pneumonia due to COVID-19 virus [U07.1, J12.82] Patient Active Problem List   Diagnosis Date Noted   Hyperkalemia 06/15/2021   SIRS (systemic inflammatory response syndrome) ([place]) 06/15/2021   Fluid overload 06/15/2021   Pressure injury of skin 06/12/2021   Respiratory failure ([place])    Malnutrition of moderate degree 06/06/2021   MSSA bacteremia    Encephalopathy acute    Chronic obstructive pulmonary disease with acute lower respiratory infection ([place])    Elevated troponin    Atrial fibrillation with RVR ([place]) 05/31/2021   Acute on chronic respiratory failure with hypoxia ([place]) 05/31/2021   CHF exacerbation ([place]) 05/31/2021   Pneumonia due to COVID-19 virus 05/31/2021   Urinary retention 07/26/2020   Prostate cancer metastatic to bone ([place]) 05/18/2019   Fracture 05/17/2019   Hypocalcemia 05/14/2019   Bilateral leg edema 01/28/2019   Symptomatic anemia 01/05/2019   Gross hematuria 01/05/2019   Gastrointestinal hemorrhage with melena 01/05/2019   OSA on CPAP    Hypothyroidism (acquired)    Essential hypertension    Dyslipidemia    Diabetes mellitus with end-stage renal disease ([place])    Acute blood loss anemia    Chronic diastolic congestive heart  failure ([place]) 02/09/2018   Anemia, chronic renal failure, stage 4 (severe) ([place]) 11/09/2017   Chronic respiratory failure with hypoxia and hypercapnia ([place]) 11/09/2017   Iron deficiency anemia 10/28/2017   Atrial fibrillation ([place]) 10/07/2017   Morbid obesity with BMI of 40.0-44.9, adult ([place]) 07/14/2016   Diabetic peripheral neuropathy associated with type 2 diabetes mellitus ([place])  10/10/2015   Allergy 04/30/2015   Bronchitis 04/30/2015   ED (erectile dysfunction) of organic origin 04/30/2015   Lipoma of back 04/30/2015   Microalbuminuria 04/30/2015   Osteoarthritis 04/30/2015   Microalbuminuria due to type 2 diabetes mellitus ([place]) 01/09/2014   ESRD (end stage renal disease) on dialysis ([place]) 07/22/2013   Acute ill-defined cerebrovascular disease 04/04/2013   Dizziness 04/04/2013   Hypercholesterolemia 04/04/2013   Hypoxemia 04/04/2013   Insomnia 04/04/2013   Periodic limb movement disorder 04/04/2013   Diplopia 06/07/2012   Personal history of transient ischemic attack (TIA), and cerebral infarction without residual deficits 05/19/2011   PCP:  [name], MD Pharmacy:   [place], [place] - [address] [place] [address] Phone: [phone] Fax: [phone]     Social Determinants of Health (SDOH) Interventions    Readmission Risk Interventions Readmission Risk Prevention Plan 06/21/2021 01/12/2019  Transportation Screening Complete Complete  PCP or Specialist Appt within 3-5 Days - Complete  HRI or [place] - Complete  Social Work Consult for [place] Planning/Counseling - Complete  Palliative Care Screening - Not Applicable  Medication Review Press photographer) Complete Complete  PCP or Specialist appointment within 3-5 days of discharge Complete -  [place] or Home Care Consult Complete -  SW Recovery Care/Counseling Consult Complete -  Palliative Care Screening Not Applicable -  Skilled Nursing Facility Complete -  Some recent data might be hidden

## 2021-06-22 LAB — GLUCOSE, CAPILLARY
Glucose-Capillary: 89 mg/dL (ref 70–99)
Glucose-Capillary: 168 mg/dL — ABNORMAL HIGH (ref 70–99)
Glucose-Capillary: 201 mg/dL — ABNORMAL HIGH (ref 70–99)
Glucose-Capillary: 202 mg/dL — ABNORMAL HIGH (ref 70–99)

## 2021-06-22 LAB — RENAL FUNCTION PANEL
Albumin: 2 g/dL — ABNORMAL LOW (ref 3.5–5.0)
Anion gap: 8 (ref 5–15)
BUN: 21 mg/dL (ref 8–23)
CO2: 28 mmol/L (ref 22–32)
Calcium: 8 mg/dL — ABNORMAL LOW (ref 8.9–10.3)
Chloride: 95 mmol/L — ABNORMAL LOW (ref 98–111)
Creatinine, Ser: 3.66 mg/dL — ABNORMAL HIGH (ref 0.61–1.24)
GFR, Estimated: 17 mL/min — ABNORMAL LOW (ref >60)
Glucose, Bld: 91 mg/dL (ref 70–99)
Phosphorus: 3.7 mg/dL (ref 2.5–4.6)
Potassium: 3.8 mmol/L (ref 3.5–5.1)
Sodium: 131 mmol/L — ABNORMAL LOW (ref 135–145)

## 2021-06-22 LAB — HEPATITIS B SURFACE ANTIBODY, QUANTITATIVE: Hep B S AB Quant (Post): 15.4 m[IU]/mL (ref >9.9)

## 2021-06-22 LAB — CBC WITH DIFFERENTIAL/PLATELET
Abs Immature Granulocytes: 0.06 10*3/uL (ref 0.00–0.07)
Basophils Absolute: 0 10*3/uL (ref 0.0–0.1)
Basophils Relative: 1 %
Eosinophils Absolute: 0.1 10*3/uL (ref 0.0–0.5)
Eosinophils Relative: 2 %
HCT: 25.3 % — ABNORMAL LOW (ref 39.0–52.0)
Hemoglobin: 7.3 g/dL — ABNORMAL LOW (ref 13.0–17.0)
Immature Granulocytes: 1 %
Lymphocytes Relative: 13 %
Lymphs Abs: 0.7 10*3/uL (ref 0.7–4.0)
MCH: 28.2 pg (ref 26.0–34.0)
MCHC: 28.9 g/dL — ABNORMAL LOW (ref 30.0–36.0)
MCV: 97.7 fL (ref 80.0–100.0)
Monocytes Absolute: 0.5 10*3/uL (ref 0.1–1.0)
Monocytes Relative: 9 %
Neutro Abs: 4.4 10*3/uL (ref 1.7–7.7)
Neutrophils Relative %: 74 %
Platelets: 177 10*3/uL (ref 150–400)
RBC: 2.59 MIL/uL — ABNORMAL LOW (ref 4.22–5.81)
RDW: 19.2 % — ABNORMAL HIGH (ref 11.5–15.5)
WBC: 5.8 10*3/uL (ref 4.0–10.5)
nRBC: 1.5 % — ABNORMAL HIGH (ref 0.0–0.2)

## 2021-06-22 LAB — BRAIN NATRIURETIC PEPTIDE: B Natriuretic Peptide: 1418.4 pg/mL — ABNORMAL HIGH (ref 0.0–100.0)

## 2021-06-22 LAB — C-REACTIVE PROTEIN: CRP: 5.1 mg/dL — ABNORMAL HIGH (ref <1.0)

## 2021-06-22 NOTE — Progress Notes (Signed)
[facility] Progress Note   Subjective:    Seen and examined at bedside. No complaints. Denies SOB and CP. Tolerated yesterday's HD with net UF 2.5L.   Objective Vitals:   06/22/21 0400 06/22/21 0833 06/22/21 0935 06/22/21 1218  BP:  99/65 (!) 103/53 (!) 102/57  Pulse:  74 70 75  Resp:  20 17 20   Temp:  98.2 F (36.8 C)  (!) 97.4 F (36.3 C)  TempSrc:  Axillary  Oral  SpO2:  94% 99% 100%  Weight: 117.8 kg     Height:       Physical Exam General: Lying in bed; NAD Heart: Normal S1 and S2; No murmurs, gallops, or rubs Lungs: Clear anteriorly and laterally; No wheezing, rales, or rhonchi Abdomen: Large, soft, and non-tender Extremities: 1+ edema noted LLE, no edema bilateral thighs or RLE Dialysis Access:  L AVF (+) Bruit/Thrill   Filed Weights   06/21/21 0737 06/21/21 1145 06/22/21 0400  Weight: 119 kg 116.5 kg 117.8 kg    Intake/Output Summary (Last 24 hours) at 06/22/2021 1738 Last data filed at 06/22/2021 0935 Gross per 24 hour  Intake 241.27 ml  Output 0 ml  Net 241.27 ml    Additional Objective Labs: Basic Metabolic Panel: Recent Labs  Lab 06/20/21 0256 06/21/21 0120 06/22/21 0044  NA 134* 132* 131*  K 4.0 4.4 3.8  CL 95* 95* 95*  CO2 29 24 28   GLUCOSE 134* 138* 91  BUN 24* 31* 21  CREATININE 4.04* 5.21* 3.66*  CALCIUM 7.8* 8.3* 8.0*  PHOS 4.3 4.9* 3.7   Liver Function Tests: Recent Labs  Lab 06/19/21 0135 06/20/21 0256 06/21/21 0120 06/22/21 0044  AST 27 24 29   --   ALT 5 5 5   --   ALKPHOS 89 109 120  --   BILITOT 0.3 0.5 0.7  --   PROT 6.4* 6.2* 6.6  --   ALBUMIN 2.0*   2.0* 2.0*   2.0* 2.2*   2.2* 2.0*   No results for input(s): LIPASE, AMYLASE in the last 168 hours. CBC: Recent Labs  Lab 06/18/21 0133 06/19/21 0135 06/20/21 0256 06/21/21 0147 06/22/21 0044  WBC 5.8 5.9 5.8 7.0 5.8  NEUTROABS 4.7 4.7 4.5 5.7 4.4  HGB 7.0* 7.2* 7.3* 7.8* 7.3*  HCT 22.8* 24.7* 24.9* 27.0* 25.3*  MCV 90.8 94.6 95.4 96.8 97.7   PLT 195 189 179 184 177   Blood Culture    Component Value Date/Time   SDES BLOOD RIGHT ARM 06/15/2021 0919   SPECREQUEST  06/15/2021 0919    BOTTLES DRAWN AEROBIC AND ANAEROBIC Blood Culture adequate volume   CULT  06/15/2021 0919    NO GROWTH 5 DAYS Performed at [facility], [place] [address], [place], [place] [address]    REPTSTATUS 06/20/2021 FINAL 06/15/2021 0919    Cardiac Enzymes: No results for input(s): CKTOTAL, CKMB, CKMBINDEX, TROPONINI in the last 168 hours. CBG: Recent Labs  Lab 06/21/21 1241 06/21/21 1620 06/21/21 1947 06/22/21 0839 06/22/21 1226  GLUCAP 77 106* 111* 89 168*   Iron Studies: No results for input(s): IRON, TIBC, TRANSFERRIN, FERRITIN in the last 72 hours. Lab Results  Component Value Date   INR 1.2 05/19/2019   INR 1.2 05/18/2019   INR 1.2 05/17/2019   Studies/Results: No results found.  Medications:   ceFAZolin (ANCEF) IV Stopped (06/21/21 1249)    amitriptyline  100 mg Oral QHS   apixaban  5 mg Oral BID   atorvastatin  80 mg  Oral QPM   chlorhexidine  15 mL Mouth Rinse BID   Chlorhexidine Gluconate Cloth  6 each Topical Q0600   darbepoetin (ARANESP) injection - DIALYSIS  200 mcg Intravenous Q Wed-HD   insulin aspart  0-6 Units Subcutaneous TID WC   insulin glargine-yfgn  10 Units Subcutaneous QHS   levothyroxine  100 mcg Oral QAC breakfast   lidocaine-prilocaine  1 application Topical Q M,W,F   mouth rinse  15 mL Mouth Rinse q12n4p   metoprolol tartrate  12.5 mg Oral BID   midodrine  5 mg Oral TID WC   multivitamin with minerals  1 tablet Oral Daily   sertraline  100 mg Oral Daily    Dialysis Orders: [facility] MWF  4h  450 bfr  123kg  2/2 bath  LU AVF   Hep none  - mircera 225 mcg every 2 weeks - last received 200 mcg on 12/14  - venofer 100 mg (ordered for 10 doses)  - no vdra   Assessment/Plan:   AHRF. Volume overload. Pulm edema on admit. Continue volume removal with HD. Now below OP dry weight. Will have lower EDW at  discharge.  Recent COVID pna - was on vent 1/05 to 1/08. Unasyn started 1/16 ?aspiration PNA  ESRD - HD MWF.  Continue on schedule. On HD-UFG as tolerated. Next HD 06/24/21. BP/ volume - Soft blood pressures this admission. [place]ine 5mg  TID started. Volume improving, now below EDW.  Anemia ckd - Hgb now 7.3 s/p prbcs last admit.  Transfuse Hgb <7. Continue Aranesp 200 q Wed.  Tsat 12% Ferritin 638- Getting IV Fe load here.  MBD ckd - cont auryxia ac.  Atrial Fibrillation w/ RVR - Rate controlled. On Eliquis, metoprolol  MSSA bacteremia. Prior to this admission. On cefazolin stop date 07/17/21.  New cavitary lesion in RUL on CT. Seen by PCCM and felt consistent with septic pulmonary embolus. Plan for 6 weeks Augmentin at discharge   Tobie Poet, NP [place] 06/22/2021,5:38 PM  LOS: 7 days

## 2021-06-22 NOTE — Plan of Care (Signed)

## 2021-06-22 NOTE — Progress Notes (Signed)
RT spoke to patient about wearing CPAP and patient stated he was not going to wear tonight.  Patient in no distress at this time. RT will continue to monitor.

## 2021-06-22 NOTE — Progress Notes (Signed)
PROGRESS NOTE                                                                                                                                                                                                             Patient Demographics:    Richard Hall, is a 66 y.o. male, DOB - 04/16/1956, TTS:[id number]  Outpatient Primary MD for the patient is Richard Mc, MD    LOS - 7  Admit date - 06/15/2021    Chief Complaint  Patient presents with   Shortness of Breath       Brief Narrative (HPI from H&P)    Richard Hall is a 66 y.o. male with medical history significant of hypertension, atrial fibrillation, congestive heart failure, CVA, ESRD on HD, and prostate cancer presents with complaints of shortness of breath.  Patient had just recently been hospitalized from 12/30-1/13 after initially presenting with septic shock related to MSSA bacteremia and COVID-19 viral pneumonia.  During his hospital stay patient required intubation.  ID had been consulted and patient was recommended to complete 6 weeks of cefazolin to be completed on 07/17/2021.  He went to the rehab facility but soon was sent back to the hospital for hypoxia, in the ER his work-up was suggestive of possible continued pneumonia, troponin, cardiology was consulted and he was admitted for further care.   Subjective:   Patient in bed, appears comfortable, denies any headache, no fever, no chest pain or pressure, no shortness of breath , no abdominal pain. No new focal weakness.   Assessment  & Plan :   Acute Hypoxic Resp. Failure due to Acute on chronic diastolic CHF in the presence of severe MR, EF 60% on recent echo with NSTEMI in a patient with ESRD and recent COVID-19 pneumonia with a new cavitary lesion on CT scan ?  Chronic intermittent microaspiration - no sepsis this admission, however does have leukocytosis with a cavitary lesion on CT scan raising the  suspicion for possible underlying aspiration, SLP following, also continue fluid removal through HD, renal and cardiology on board will also request pulmonary to opine on his cavitary lung lesion. CRP and procalcitonin also point towards a bacterial infectious etiology as well.  He was briefly placed on Unasyn with excellent clinical improvement, cultures here this admission negative thus far.  Have been consulted ID  discussed the plan with pulmonary on 06/20/2021 plan is to continue cefazolin till 07/17/2021 thereafter outpatient ID and pulmonary follow-up.  Unasyn has been stopped &Cefazolin has been resumed.   Encouraged to sit up in chair in the daytime use I-S and flutter valve for pulmonary toiletry.  Will advance activity and titrate down oxygen as possible.  He is on cefazolin for recent MSSA bacteremia with stop date of 07/17/2021 (pharm confirmed)    2.  ESRD.  Renal on board for fluid removal through HD.  3.  Recent MSSA bacteremia.  Cefazolin with a stop date of 07/17/2021 seen by ID this admission.  4.  Dysphagia.  Speech therapy following currently on dysphagia 2 diet.  Kindly see #1 above.  5.  Hypotension.  Blood pressure has stabilized and improving titrate off midodrine.  6.  Obesity with OSA.  BMI 35 follow with PCP for weight loss nightly CPAP.  Counseled on compliance  7.  Recent left ankle fracture.  Nonweightbearing left leg per last admission, outpatient orthopedics follow-up.  PT OT.  8.  Hypothyroidism.  On Synthroid   9.  Chronic A. fib [place] vas 2 score of greater than 4.  On Eliquis continue low-dose beta-blocker as tolerated by blood pressure.    10.  AOCD.  Monitor, improving with fluid removal through HD, type screen done if drops below 7 transfuse, he has consented for transfusion if needed  11.  Dyslipidemia.  On Synthroid continue  12. History of prostate cancer - Continue Xtandi.  13.  Elevated troponin.  Likely demand ischemia in the setting of ESRD,  demand mismatch caused by hypoxia from #1 above, chest pain-free.  Cardiology on board, on Eliquis, statin, low-dose beta-blocker as tolerated by blood pressure and monitor  14.  Severe MR.  Outpatient cardiology follow-up.   15. DM type II.  On Lantus and sliding scale.  Monitor and adjust  Lab Results  Component Value Date   HGBA1C 6.1 (H) 06/01/2021    CBG (last 3)  Recent Labs    06/21/21 1620 06/21/21 1947 06/22/21 0839  GLUCAP 106* 111* 89           Condition - Extremely Guarded  Family Communication  : [name] [phone] on 06/17/21, 06/18/21, 06/19/21, 06/22/21 message left at 10:57 AM  Code Status : Full code  Consults  : Cardiology, Pulmonary  PUD Prophylaxis :    Procedures  :     CT scan - 1. The appearance of the chest suggest congestive heart failure, as above. 2. In addition, there is a new thick-walled cavitary nodule measuring 1.4 x 1.2 cm in the right upper lobe. This is potentially infectious or inflammatory in etiology, however, underlying neoplasm is not excluded. Short-term follow-up repeat noncontrast chest CT is recommended in 2-3 months to ensure the stability or regression of this lesion. 3. Mildly enlarged right paratracheal lymph node, nonspecific in the setting of presumed congestive heart failure, but concerning in light of the right upper lobe pulmonary nodule. Attention at time of repeat CT examination is recommended to ensure stability/regression. 4. Diffuse sclerotic lesions noted throughout the visualized axial and appendicular skeleton, presumably reflective of widespread osseous metastasis in this patient with history of prostate cancer. 5. Aortic atherosclerosis, in addition to left main and three-vessel coronary artery disease. Please note that although the presence of coronary artery calcium documents the presence of coronary artery disease, the severity of this disease and any potential stenosis cannot be assessed on this non-gated CT  examination. Assessment for potential risk factor modification, dietary therapy or pharmacologic therapy may be warranted, if clinically indicated. 6. There are calcifications of the aortic valve. Echocardiographic correlation for evaluation of potential valvular dysfunction may be warranted if clinically indicated. Aortic Atherosclerosis (ICD10-I70.0).      Disposition Plan  :    Status is: Inpatient  Remains inpatient appropriate because: Acute hypoxic respiratory failure  DVT Prophylaxis  :     apixaban (ELIQUIS) tablet 5 mg     Lab Results  Component Value Date   PLT 177 06/22/2021    Diet :  Diet Order             DIET DYS 3 Room service appropriate? Yes; Fluid consistency: Thin  Diet effective now                    Inpatient Medications  Scheduled Meds:  amitriptyline  100 mg Oral QHS   apixaban  5 mg Oral BID   atorvastatin  80 mg Oral QPM   chlorhexidine  15 mL Mouth Rinse BID   Chlorhexidine Gluconate Cloth  6 each Topical Q0600   darbepoetin (ARANESP) injection - DIALYSIS  200 mcg Intravenous Q Wed-HD   insulin aspart  0-6 Units Subcutaneous TID WC   insulin glargine-yfgn  10 Units Subcutaneous QHS   levothyroxine  100 mcg Oral QAC breakfast   lidocaine-prilocaine  1 application Topical Q M,W,F   mouth rinse  15 mL Mouth Rinse q12n4p   metoprolol tartrate  12.5 mg Oral BID   midodrine  5 mg Oral TID WC   multivitamin with minerals  1 tablet Oral Daily   sertraline  100 mg Oral Daily   Continuous Infusions:   ceFAZolin (ANCEF) IV Stopped (06/21/21 1249)   PRN Meds:.acetaminophen **OR** acetaminophen, albuterol, diphenoxylate-atropine, guaiFENesin, ondansetron (ZOFRAN) IV     Time Spent in minutes  30   Lala Lund M.D on 06/22/2021 at 10:57 AM  To page go to [email or web address]   [facility] -  Office  [phone]  See all Orders from today for further details    Objective:   Vitals:   06/22/21 0359 06/22/21 0400 06/22/21 0833  06/22/21 0935  BP: (!) 103/55  99/65 (!) 103/53  Pulse: 71  74 70  Resp: 19  20 17   Temp: (!) 97.3 F (36.3 C)  98.2 F (36.8 C)   TempSrc: Axillary  Axillary   SpO2: 96%  94% 99%  Weight:  117.8 kg    Height:        Wt Readings from Last 3 Encounters:  06/22/21 117.8 kg  06/14/21 117.6 kg  07/26/20 125.3 kg     Intake/Output Summary (Last 24 hours) at 06/22/2021 1057 Last data filed at 06/22/2021 0935 Gross per 24 hour  Intake 241.27 ml  Output 2500 ml  Net -2258.73 ml     Physical Exam  Awake Alert, No new F.N deficits, Normal affect [place].AT,PERRAL Supple Neck, No JVD,   Symmetrical Chest wall movement, Good air movement bilaterally, CTAB RRR,No Gallops, Rubs or new Murmurs,  +ve B.Sounds, Abd Soft, No tenderness,   No Cyanosis, Clubbing or edema     RN pressure injury documentation: Pressure Injury 06/11/21 Coccyx Medial Stage 2 -  Partial thickness loss of dermis presenting as a shallow open injury with a red, pink wound bed without slough. (Active)  06/11/21 2159  Location: Coccyx  Location Orientation: Medial  Staging: Stage 2 -  Partial thickness loss of dermis  presenting as a shallow open injury with a red, pink wound bed without slough.  Wound Description (Comments):   Present on Admission:      Data Review:   Recent Labs  Lab 06/18/21 0133 06/19/21 0135 06/20/21 0256 06/21/21 0147 06/22/21 0044  WBC 5.8 5.9 5.8 7.0 5.8  HGB 7.0* 7.2* 7.3* 7.8* 7.3*  HCT 22.8* 24.7* 24.9* 27.0* 25.3*  PLT 195 189 179 184 177  MCV 90.8 94.6 95.4 96.8 97.7  MCH 27.9 27.6 28.0 28.0 28.2  MCHC 30.7 29.1* 29.3* 28.9* 28.9*  RDW 17.1* 17.6* 17.8* 18.5* 19.2*  LYMPHSABS 0.5* 0.6* 0.6* 0.6* 0.7  MONOABS 0.4 0.4 0.5 0.5 0.5  EOSABS 0.1 0.1 0.1 0.1 0.1  BASOSABS 0.0 0.0 0.0 0.1 0.0    Recent Labs  Lab 06/16/21 0105 06/16/21 0105 06/17/21 0405 06/18/21 0133 06/19/21 0135 06/20/21 0256 06/21/21 0120 06/21/21 0146 06/21/21 0147 06/22/21 0044  NA 129*  --   127*   126* 132*   133* 130* 134* 132*  --   --  131*  K 4.0  --  4.1   4.1 3.9   3.9 3.9 4.0 4.4  --   --  3.8  CL 91*  --  88*   87* 94*   95* 93* 95* 95*  --   --  95*  CO2 25  --  24   25 26   27 27 29 24   --   --  28  GLUCOSE 178*  --  161*   160* 157*   155* 163* 134* 138*  --   --  91  BUN 57*  --  72*   71* 40*   40* 49* 24* 31*  --   --  21  CREATININE 5.36*  --  6.42*   6.31* 4.77*   4.70* 6.00* 4.04* 5.21*  --   --  3.66*  CALCIUM 7.6*  --  7.6*   7.7* 7.6*   7.6* 7.7* 7.8* 8.3*  --   --  8.0*  AST  --   --  17 18 27 24 29   --   --   --   ALT  --   --  <5 <5 5 5 5   --   --   --   ALKPHOS  --   --  81 84 89 109 120  --   --   --   BILITOT  --   --  0.7 0.4 0.3 0.5 0.7  --   --   --   ALBUMIN 2.1*  --  2.1*   2.1* 1.9*   2.0* 2.0*   2.0* 2.0*   2.0* 2.2*   2.2*  --   --  2.0*  MG  --   --  2.4 2.3 2.4  --   --   --   --   --   CRP  --    < > 14.3* 10.1* 8.2* 6.2*  --   --  6.2* 5.1*  PROCALCITON 4.53  --   --   --   --   --   --   --   --   --   BNP 2,006.2*  --  2,107.4* 1,929.3* 1,938.6* 2,017.9*  --  1,612.0*  --  1,418.4*   < > = values in this interval not displayed.         Radiology Reports No results found.

## 2021-06-23 LAB — CBC WITH DIFFERENTIAL/PLATELET
Abs Immature Granulocytes: 0.1 10*3/uL — ABNORMAL HIGH (ref 0.00–0.07)
Basophils Absolute: 0 10*3/uL (ref 0.0–0.1)
Basophils Relative: 0 %
Eosinophils Absolute: 0.1 10*3/uL (ref 0.0–0.5)
Eosinophils Relative: 1 %
HCT: 24.3 % — ABNORMAL LOW (ref 39.0–52.0)
Hemoglobin: 7.1 g/dL — ABNORMAL LOW (ref 13.0–17.0)
Immature Granulocytes: 1 %
Lymphocytes Relative: 9 %
Lymphs Abs: 0.7 10*3/uL (ref 0.7–4.0)
MCH: 28.6 pg (ref 26.0–34.0)
MCHC: 29.2 g/dL — ABNORMAL LOW (ref 30.0–36.0)
MCV: 98 fL (ref 80.0–100.0)
Monocytes Absolute: 0.7 10*3/uL (ref 0.1–1.0)
Monocytes Relative: 10 %
Neutro Abs: 5.9 10*3/uL (ref 1.7–7.7)
Neutrophils Relative %: 79 %
Platelets: 161 10*3/uL (ref 150–400)
RBC: 2.48 MIL/uL — ABNORMAL LOW (ref 4.22–5.81)
RDW: 20.1 % — ABNORMAL HIGH (ref 11.5–15.5)
WBC: 7.6 10*3/uL (ref 4.0–10.5)
nRBC: 1.1 % — ABNORMAL HIGH (ref 0.0–0.2)

## 2021-06-23 LAB — RENAL FUNCTION PANEL
Albumin: 2 g/dL — ABNORMAL LOW (ref 3.5–5.0)
Anion gap: 10 (ref 5–15)
BUN: 37 mg/dL — ABNORMAL HIGH (ref 8–23)
CO2: 29 mmol/L (ref 22–32)
Calcium: 8.1 mg/dL — ABNORMAL LOW (ref 8.9–10.3)
Chloride: 94 mmol/L — ABNORMAL LOW (ref 98–111)
Creatinine, Ser: 4.86 mg/dL — ABNORMAL HIGH (ref 0.61–1.24)
GFR, Estimated: 12 mL/min — ABNORMAL LOW (ref >60)
Glucose, Bld: 205 mg/dL — ABNORMAL HIGH (ref 70–99)
Phosphorus: 4.3 mg/dL (ref 2.5–4.6)
Potassium: 3.8 mmol/L (ref 3.5–5.1)
Sodium: 133 mmol/L — ABNORMAL LOW (ref 135–145)

## 2021-06-23 LAB — GLUCOSE, CAPILLARY
Glucose-Capillary: 194 mg/dL — ABNORMAL HIGH (ref 70–99)
Glucose-Capillary: 205 mg/dL — ABNORMAL HIGH (ref 70–99)
Glucose-Capillary: 215 mg/dL — ABNORMAL HIGH (ref 70–99)
Glucose-Capillary: 222 mg/dL — ABNORMAL HIGH (ref 70–99)

## 2021-06-23 LAB — BRAIN NATRIURETIC PEPTIDE: B Natriuretic Peptide: 1284.6 pg/mL — ABNORMAL HIGH (ref 0.0–100.0)

## 2021-06-23 LAB — C-REACTIVE PROTEIN: CRP: 5.9 mg/dL — ABNORMAL HIGH (ref <1.0)

## 2021-06-23 MED ORDER — SODIUM CHLORIDE 0.9 % IV SOLN: 100.0000 mL | INTRAVENOUS | Status: DC | PRN

## 2021-06-23 MED ORDER — LIDOCAINE-PRILOCAINE 2.5-2.5 % EX CREA
1.0000 "application " | TOPICAL_CREAM | CUTANEOUS | Status: DC | PRN
  Filled 2021-06-23: qty 5

## 2021-06-23 MED ORDER — LIDOCAINE HCL (PF) 1 % IJ SOLN
5.0000 mL | INTRAMUSCULAR | Status: DC | PRN
  Filled 2021-06-23: qty 5

## 2021-06-23 MED ORDER — ALTEPLASE 2 MG IJ SOLR: 2.0000 mg | Freq: Once | INTRAMUSCULAR | Status: DC | PRN

## 2021-06-23 MED ORDER — HEPARIN SODIUM (PORCINE) 1000 UNIT/ML DIALYSIS: 1000.0000 [IU] | INTRAMUSCULAR | Status: DC | PRN

## 2021-06-23 MED ORDER — PENTAFLUOROPROP-TETRAFLUOROETH EX AERO: 1.0000 "application " | INHALATION_SPRAY | CUTANEOUS | Status: DC | PRN

## 2021-06-23 NOTE — Progress Notes (Signed)
[facility] Progress Note   Subjective:    Seen and examined at bedside. Sitting up beside bed eating breakfast. No complaints. Per patient, supposed to return back to [facility] 06/24/21. Plan for HD 1st shift in preparation for discharge tomorrow.  Objective Vitals:   06/23/21 0838 06/23/21 0900 06/23/21 1307 06/23/21 1644  BP: (!) 109/59  102/62 (!) 109/57  Pulse: 71  66 70  Resp: 20  20 18   Temp: 97.6 F (36.4 C)  97.8 F (36.6 C) (!) 97.2 F (36.2 C)  TempSrc: Oral  Axillary Oral  SpO2: 100% 100% 98% 95%  Weight:      Height:       Physical Exam General: Sitting up beside the bed; NAD Heart: Normal S1 and S2; No murmurs, gallops, or rubs Lungs: Clear anteriorly and laterally; No wheezing, rales, or rhonchi Abdomen: Large, soft, and non-tender Extremities: 1+ edema noted LLE, no edema bilateral thighs or RLE Dialysis Access:  L AVF (+) Bruit/Thrill   Filed Weights   06/21/21 0737 06/21/21 1145 06/22/21 0400  Weight: 119 kg 116.5 kg 117.8 kg    Intake/Output Summary (Last 24 hours) at 06/23/2021 1733 Last data filed at 06/23/2021 7673 Gross per 24 hour  Intake 240 ml  Output 0 ml  Net 240 ml    Additional Objective Labs: Basic Metabolic Panel: Recent Labs  Lab 06/21/21 0120 06/22/21 0044 06/23/21 0057  NA 132* 131* 133*  K 4.4 3.8 3.8  CL 95* 95* 94*  CO2 24 28 29   GLUCOSE 138* 91 205*  BUN 31* 21 37*  CREATININE 5.21* 3.66* 4.86*  CALCIUM 8.3* 8.0* 8.1*  PHOS 4.9* 3.7 4.3   Liver Function Tests: Recent Labs  Lab 06/19/21 0135 06/20/21 0256 06/21/21 0120 06/22/21 0044 06/23/21 0057  AST 27 24 29   --   --   ALT 5 5 5   --   --   ALKPHOS 89 109 120  --   --   BILITOT 0.3 0.5 0.7  --   --   PROT 6.4* 6.2* 6.6  --   --   ALBUMIN 2.0*   2.0* 2.0*   2.0* 2.2*   2.2* 2.0* 2.0*   No results for input(s): LIPASE, AMYLASE in the last 168 hours. CBC: Recent Labs  Lab 06/19/21 0135 06/20/21 0256 06/21/21 0147 06/22/21 0044 06/23/21 0057   WBC 5.9 5.8 7.0 5.8 7.6  NEUTROABS 4.7 4.5 5.7 4.4 5.9  HGB 7.2* 7.3* 7.8* 7.3* 7.1*  HCT 24.7* 24.9* 27.0* 25.3* 24.3*  MCV 94.6 95.4 96.8 97.7 98.0  PLT 189 179 184 177 161   Blood Culture    Component Value Date/Time   SDES BLOOD RIGHT ARM 06/15/2021 0919   SPECREQUEST  06/15/2021 0919    BOTTLES DRAWN AEROBIC AND ANAEROBIC Blood Culture adequate volume   CULT  06/15/2021 0919    NO GROWTH 5 DAYS Performed at [facility], [place] [address], [place], [place] [address]    REPTSTATUS 06/20/2021 FINAL 06/15/2021 0919    Cardiac Enzymes: No results for input(s): CKTOTAL, CKMB, CKMBINDEX, TROPONINI in the last 168 hours. CBG: Recent Labs  Lab 06/22/21 1754 06/22/21 2035 06/23/21 0843 06/23/21 1310 06/23/21 1651  GLUCAP 201* 202* 194* 205* 215*   Iron Studies: No results for input(s): IRON, TIBC, TRANSFERRIN, FERRITIN in the last 72 hours. Lab Results  Component Value Date   INR 1.2 05/19/2019   INR 1.2 05/18/2019   INR 1.2 05/17/2019   Studies/Results: No  results found.  Medications:   ceFAZolin (ANCEF) IV Stopped (06/21/21 1249)    amitriptyline  100 mg Oral QHS   apixaban  5 mg Oral BID   atorvastatin  80 mg Oral QPM   chlorhexidine  15 mL Mouth Rinse BID   Chlorhexidine Gluconate Cloth  6 each Topical Q0600   darbepoetin (ARANESP) injection - DIALYSIS  200 mcg Intravenous Q Wed-HD   insulin aspart  0-6 Units Subcutaneous TID WC   insulin glargine-yfgn  10 Units Subcutaneous QHS   levothyroxine  100 mcg Oral QAC breakfast   lidocaine-prilocaine  1 application Topical Q M,W,F   mouth rinse  15 mL Mouth Rinse q12n4p   metoprolol tartrate  12.5 mg Oral BID   midodrine  5 mg Oral TID WC   multivitamin with minerals  1 tablet Oral Daily   sertraline  100 mg Oral Daily    Dialysis Orders: [facility] MWF  4h  450 bfr  123kg  2/2 bath  LU AVF   Hep none  - mircera 225 mcg every 2 weeks - last received 200 mcg on 12/14  - venofer 100 mg (ordered for 10 doses)   - no vdra   Assessment/Plan: AHRF. Volume overload. Pulm edema on admit. Continue volume removal with HD. Now below OP dry weight. Will have lower EDW at discharge.  Recent COVID pna - was on vent 1/05 to 1/08. Unasyn started 1/16 ?aspiration PNA  ESRD - HD MWF.  Continue on schedule. On HD-UFG as tolerated. Next HD 06/24/21. BP/ volume - Soft blood pressures this admission. Midordine 5mg  TID started. Volume improving, now below EDW.  Anemia ckd - Hgb now 7.1 s/p prbcs last admit. Will check CBC in AM. More likely will transfuse with HD tomorrow. Continue Aranesp 200 q Wed.  Tsat 12% Ferritin 638- Getting IV Fe load here.  MBD ckd - cont auryxia ac.  Atrial Fibrillation w/ RVR - Rate controlled. On Eliquis, metoprolol  MSSA bacteremia. Prior to this admission. On cefazolin stop date 07/17/21.  New cavitary lesion in RUL on CT. Seen by PCCM and felt consistent with septic pulmonary embolus. Plan for 6 weeks Augmentin at discharge   Tobie Poet, NP [place] 06/23/2021,5:33 PM  LOS: 8 days

## 2021-06-23 NOTE — Progress Notes (Signed)
PROGRESS NOTE                                                                                                                                                                                                             Patient Demographics:    Richard Hall, is a 66 y.o. male, DOB - Oct 18, 1955, GBT:[id number]  Outpatient Primary MD for the patient is [name], MD    LOS - 8  Admit date - 06/15/2021    Chief Complaint  Patient presents with   Shortness of Breath       Brief Narrative (HPI from H&P)    Richard Hall is a 66 y.o. male with medical history significant of hypertension, atrial fibrillation, congestive heart failure, CVA, ESRD on HD, and prostate cancer presents with complaints of shortness of breath.  Patient had just recently been hospitalized from 12/30-1/13 after initially presenting with septic shock related to MSSA bacteremia and COVID-19 viral pneumonia.  During his hospital stay patient required intubation.  ID had been consulted and patient was recommended to complete 6 weeks of cefazolin to be completed on 07/17/2021.  He went to the rehab facility but soon was sent back to the hospital for hypoxia, in the ER his work-up was suggestive of possible continued pneumonia, troponin, cardiology was consulted and he was admitted for further care.   Subjective:   Patient in bed, appears comfortable, denies any headache, no fever, no chest pain or pressure, no shortness of breath , no abdominal pain. No new focal weakness.   Assessment  & Plan :   Acute Hypoxic Resp. Failure due to Acute on chronic diastolic CHF in the presence of severe MR, EF 60% on recent echo with NSTEMI in a patient with ESRD and recent COVID-19 pneumonia with a new cavitary lesion on CT scan ?  Chronic intermittent microaspiration - no sepsis this admission, however does have leukocytosis with a cavitary lesion on CT scan raising the  suspicion for possible underlying aspiration, SLP following, also continue fluid removal through HD, renal and cardiology on board will also request pulmonary to opine on his cavitary lung lesion. CRP and procalcitonin also point towards a bacterial infectious etiology as well.  He was briefly placed on Unasyn with excellent clinical improvement, cultures here this admission negative thus far.  Have been consulted ID  discussed the plan with pulmonary on 06/20/2021 plan is to continue cefazolin till 07/17/2021 thereafter outpatient ID and pulmonary follow-up.  He is currently on cefazolin alone, plan is to dialyze him 1 more time there after discharge back to SNF most likely on 06/24/2021.   Encouraged to sit up in chair in the daytime use I-S and flutter valve for pulmonary toiletry.  Will advance activity and titrate down oxygen as possible.  He is on cefazolin for recent MSSA bacteremia with stop date of 07/17/2021 (pharm confirmed)    2.  ESRD.  Renal on board for fluid removal through HD.  3.  Recent MSSA bacteremia.  Cefazolin with a stop date of 07/17/2021 seen by ID this admission.  4.  Dysphagia.  Speech therapy following currently on dysphagia 2 diet.  Kindly see #1 above.  5.  Hypotension.  Blood pressure has stabilized and improving titrate off midodrine.  6.  Obesity with OSA.  BMI 35 follow with PCP for weight loss nightly CPAP.  Counseled on compliance  7.  Recent left ankle fracture.  Nonweightbearing left leg per last admission, outpatient orthopedics follow-up.  PT OT.  8.  Hypothyroidism.  On Synthroid   9.  Chronic A. fib [place] vas 2 score of greater than 4.  On Eliquis continue low-dose beta-blocker as tolerated by blood pressure.    10.  AOCD.  Monitor, improving with fluid removal through HD, type screen done if drops below 7 transfuse, he has consented for transfusion if needed  11.  Dyslipidemia.  On Synthroid continue  12. History of prostate cancer - Continue  Xtandi.  13.  Elevated troponin.  Likely demand ischemia in the setting of ESRD, demand mismatch caused by hypoxia from #1 above, chest pain-free.  Cardiology on board, on Eliquis, statin, low-dose beta-blocker as tolerated by blood pressure and monitor  14.  Severe MR.  Outpatient cardiology follow-up.   15. DM type II.  On Lantus and sliding scale.  Monitor and adjust  Lab Results  Component Value Date   HGBA1C 6.1 (H) 06/01/2021    CBG (last 3)  Recent Labs    06/22/21 1754 06/22/21 2035 06/23/21 0843  GLUCAP 201* 202* 194*           Condition - Extremely Guarded  Family Communication  : [name] [phone] on 06/17/21, 06/18/21, 06/19/21, 06/22/21 message left at 10:57 AM  Code Status : Full code  Consults  : Cardiology, Pulmonary  PUD Prophylaxis :    Procedures  :     CT scan - 1. The appearance of the chest suggest congestive heart failure, as above. 2. In addition, there is a new thick-walled cavitary nodule measuring 1.4 x 1.2 cm in the right upper lobe. This is potentially infectious or inflammatory in etiology, however, underlying neoplasm is not excluded. Short-term follow-up repeat noncontrast chest CT is recommended in 2-3 months to ensure the stability or regression of this lesion. 3. Mildly enlarged right paratracheal lymph node, nonspecific in the setting of presumed congestive heart failure, but concerning in light of the right upper lobe pulmonary nodule. Attention at time of repeat CT examination is recommended to ensure stability/regression. 4. Diffuse sclerotic lesions noted throughout the visualized axial and appendicular skeleton, presumably reflective of widespread osseous metastasis in this patient with history of prostate cancer. 5. Aortic atherosclerosis, in addition to left main and three-vessel coronary artery disease. Please note that although the presence of coronary artery calcium documents the presence of coronary artery disease,  the severity of  this disease and any potential stenosis cannot be assessed on this non-gated CT examination. Assessment for potential risk factor modification, dietary therapy or pharmacologic therapy may be warranted, if clinically indicated. 6. There are calcifications of the aortic valve. Echocardiographic correlation for evaluation of potential valvular dysfunction may be warranted if clinically indicated. Aortic Atherosclerosis (ICD10-I70.0).      Disposition Plan  :    Status is: Inpatient  Remains inpatient appropriate because: Acute hypoxic respiratory failure  DVT Prophylaxis  :     apixaban (ELIQUIS) tablet 5 mg     Lab Results  Component Value Date   PLT 161 06/23/2021    Diet :  Diet Order             DIET DYS 3 Room service appropriate? Yes; Fluid consistency: Thin  Diet effective now                    Inpatient Medications  Scheduled Meds:  amitriptyline  100 mg Oral QHS   apixaban  5 mg Oral BID   atorvastatin  80 mg Oral QPM   chlorhexidine  15 mL Mouth Rinse BID   Chlorhexidine Gluconate Cloth  6 each Topical Q0600   darbepoetin (ARANESP) injection - DIALYSIS  200 mcg Intravenous Q Wed-HD   insulin aspart  0-6 Units Subcutaneous TID WC   insulin glargine-yfgn  10 Units Subcutaneous QHS   levothyroxine  100 mcg Oral QAC breakfast   lidocaine-prilocaine  1 application Topical Q M,W,F   mouth rinse  15 mL Mouth Rinse q12n4p   metoprolol tartrate  12.5 mg Oral BID   midodrine  5 mg Oral TID WC   multivitamin with minerals  1 tablet Oral Daily   sertraline  100 mg Oral Daily   Continuous Infusions:   ceFAZolin (ANCEF) IV Stopped (06/21/21 1249)   PRN Meds:.acetaminophen **OR** acetaminophen, albuterol, diphenoxylate-atropine, guaiFENesin, ondansetron (ZOFRAN) IV     Time Spent in minutes  30   Lala Lund M.D on 06/23/2021 at 10:24 AM  To page go to [email or web address]   [facility] -  Office  [phone]  See all Orders from today for further  details    Objective:   Vitals:   06/22/21 2351 06/23/21 0442 06/23/21 0838 06/23/21 0900  BP: 110/60 102/65 (!) 109/59   Pulse: 72 70 71   Resp: 19 20 20    Temp: (!) 97.1 F (36.2 C) 97.7 F (36.5 C) 97.6 F (36.4 C)   TempSrc: Axillary Axillary Oral   SpO2: 97% 100% 100% 100%  Weight:      Height:        Wt Readings from Last 3 Encounters:  06/22/21 117.8 kg  06/14/21 117.6 kg  07/26/20 125.3 kg    No intake or output data in the 24 hours ending 06/23/21 1024    Physical Exam  Awake Alert, No new F.N deficits, Normal affect [place].AT,PERRAL Supple Neck, No JVD,   Symmetrical Chest wall movement, Good air movement bilaterally, CTAB RRR,No Gallops, Rubs or new Murmurs,  +ve B.Sounds, Abd Soft, No tenderness,   No Cyanosis, Clubbing or edema    RN pressure injury documentation: Pressure Injury 06/11/21 Coccyx Medial Stage 2 -  Partial thickness loss of dermis presenting as a shallow open injury with a red, pink wound bed without slough. (Active)  06/11/21 2159  Location: Coccyx  Location Orientation: Medial  Staging: Stage 2 -  Partial thickness loss of dermis presenting as a  shallow open injury with a red, pink wound bed without slough.  Wound Description (Comments):   Present on Admission:      Data Review:   Recent Labs  Lab 06/19/21 0135 06/20/21 0256 06/21/21 0147 06/22/21 0044 06/23/21 0057  WBC 5.9 5.8 7.0 5.8 7.6  HGB 7.2* 7.3* 7.8* 7.3* 7.1*  HCT 24.7* 24.9* 27.0* 25.3* 24.3*  PLT 189 179 184 177 161  MCV 94.6 95.4 96.8 97.7 98.0  MCH 27.6 28.0 28.0 28.2 28.6  MCHC 29.1* 29.3* 28.9* 28.9* 29.2*  RDW 17.6* 17.8* 18.5* 19.2* 20.1*  LYMPHSABS 0.6* 0.6* 0.6* 0.7 0.7  MONOABS 0.4 0.5 0.5 0.5 0.7  EOSABS 0.1 0.1 0.1 0.1 0.1  BASOSABS 0.0 0.0 0.1 0.0 0.0    Recent Labs  Lab 06/17/21 0405 06/18/21 0133 06/19/21 0135 06/20/21 0256 06/21/21 0120 06/21/21 0146 06/21/21 0147 06/22/21 0044 06/23/21 0057  NA 127*   126* 132*   133* 130* 134*  132*  --   --  131* 133*  K 4.1   4.1 3.9   3.9 3.9 4.0 4.4  --   --  3.8 3.8  CL 88*   87* 94*   95* 93* 95* 95*  --   --  95* 94*  CO2 24   25 26   27 27 29 24   --   --  28 29  GLUCOSE 161*   160* 157*   155* 163* 134* 138*  --   --  91 205*  BUN 72*   71* 40*   40* 49* 24* 31*  --   --  21 37*  CREATININE 6.42*   6.31* 4.77*   4.70* 6.00* 4.04* 5.21*  --   --  3.66* 4.86*  CALCIUM 7.6*   7.7* 7.6*   7.6* 7.7* 7.8* 8.3*  --   --  8.0* 8.1*  AST 17 18 27 24 29   --   --   --   --   ALT <5 <5 5 5 5   --   --   --   --   ALKPHOS 81 84 89 109 120  --   --   --   --   BILITOT 0.7 0.4 0.3 0.5 0.7  --   --   --   --   ALBUMIN 2.1*   2.1* 1.9*   2.0* 2.0*   2.0* 2.0*   2.0* 2.2*   2.2*  --   --  2.0* 2.0*  MG 2.4 2.3 2.4  --   --   --   --   --   --   CRP 14.3* 10.1* 8.2* 6.2*  --   --  6.2* 5.1* 5.9*  BNP 2,107.4* 1,929.3* 1,938.6* 2,017.9*  --  1,612.0*  --  1,418.4* 1,284.6*         Radiology Reports No results found.

## 2021-06-23 NOTE — Progress Notes (Signed)
RT note. Patient currently not requiring bipap at this time. Patient on 2L [place] sat 100% with no distress noted. RT will place patient on bipap if needed later. RT will continue to monitor,

## 2021-06-24 LAB — RENAL FUNCTION PANEL
Albumin: 2.1 g/dL — ABNORMAL LOW (ref 3.5–5.0)
Anion gap: 12 (ref 5–15)
BUN: 56 mg/dL — ABNORMAL HIGH (ref 8–23)
CO2: 25 mmol/L (ref 22–32)
Calcium: 8 mg/dL — ABNORMAL LOW (ref 8.9–10.3)
Chloride: 90 mmol/L — ABNORMAL LOW (ref 98–111)
Creatinine, Ser: 6.14 mg/dL — ABNORMAL HIGH (ref 0.61–1.24)
GFR, Estimated: 9 mL/min — ABNORMAL LOW (ref >60)
Glucose, Bld: 176 mg/dL — ABNORMAL HIGH (ref 70–99)
Phosphorus: 4.7 mg/dL — ABNORMAL HIGH (ref 2.5–4.6)
Potassium: 3.8 mmol/L (ref 3.5–5.1)
Sodium: 127 mmol/L — ABNORMAL LOW (ref 135–145)

## 2021-06-24 LAB — RESP PANEL BY RT-PCR (FLU A&B, COVID) ARPGX2
Influenza A by PCR: NEGATIVE
Influenza B by PCR: NEGATIVE
SARS Coronavirus 2 by RT PCR: POSITIVE — AB

## 2021-06-24 LAB — GLUCOSE, CAPILLARY
Glucose-Capillary: 167 mg/dL — ABNORMAL HIGH (ref 70–99)
Glucose-Capillary: 141 mg/dL — ABNORMAL HIGH (ref 70–99)
Glucose-Capillary: 134 mg/dL — ABNORMAL HIGH (ref 70–99)
Glucose-Capillary: 137 mg/dL — ABNORMAL HIGH (ref 70–99)

## 2021-06-24 LAB — CBC WITH DIFFERENTIAL/PLATELET
Abs Immature Granulocytes: 0.1 10*3/uL — ABNORMAL HIGH (ref 0.00–0.07)
Basophils Absolute: 0 10*3/uL (ref 0.0–0.1)
Basophils Relative: 0 %
Eosinophils Absolute: 0.1 10*3/uL (ref 0.0–0.5)
Eosinophils Relative: 1 %
HCT: 22.4 % — ABNORMAL LOW (ref 39.0–52.0)
Hemoglobin: 6.6 g/dL — CL (ref 13.0–17.0)
Immature Granulocytes: 1 %
Lymphocytes Relative: 9 %
Lymphs Abs: 0.7 10*3/uL (ref 0.7–4.0)
MCH: 28.8 pg (ref 26.0–34.0)
MCHC: 29.5 g/dL — ABNORMAL LOW (ref 30.0–36.0)
MCV: 97.8 fL (ref 80.0–100.0)
Monocytes Absolute: 0.9 10*3/uL (ref 0.1–1.0)
Monocytes Relative: 11 %
Neutro Abs: 6.1 10*3/uL (ref 1.7–7.7)
Neutrophils Relative %: 78 %
Platelets: 150 10*3/uL (ref 150–400)
RBC: 2.29 MIL/uL — ABNORMAL LOW (ref 4.22–5.81)
RDW: 21 % — ABNORMAL HIGH (ref 11.5–15.5)
WBC: 7.9 10*3/uL (ref 4.0–10.5)
nRBC: 0.6 % — ABNORMAL HIGH (ref 0.0–0.2)

## 2021-06-24 LAB — PREPARE RBC (CROSSMATCH)

## 2021-06-24 MED ORDER — SODIUM CHLORIDE 0.9% IV SOLUTION
Freq: Once | INTRAVENOUS | Status: AC
Start: 2021-06-24 — End: 2021-06-24

## 2021-06-24 MED ORDER — DIPHENHYDRAMINE HCL 50 MG/ML IJ SOLN: 25.0000 mg | Freq: Four times a day (QID) | INTRAMUSCULAR | Status: DC | PRN

## 2021-06-24 NOTE — Progress Notes (Signed)
[facility] Progress Note   Subjective:    Seen in HD unit. Hb 6.6 - for transfusion today. No complaints. Denies cp, dyspnea.   Objective Vitals:   06/24/21 0751 06/24/21 0803 06/24/21 0830 06/24/21 0900  BP: (!) 106/57 107/61 (!) 113/56 103/76  Pulse: 70 68 81 77  Resp: (!) 22 19 20 20   Temp: (!) 97.4 F (36.3 C)     TempSrc: Temporal     SpO2: 96% 100% 100% 96%  Weight: 120.1 kg     Height:       Physical Exam General: Sitting up beside the bed; NAD Heart: Normal S1 and S2; No murmurs, gallops, or rubs Lungs: Clear anteriorly and laterally; No wheezing, rales, or rhonchi Abdomen: Large, soft, and non-tender Extremities: 1+ edema noted LLE, no edema bilateral thighs or RLE Dialysis Access:  L AVF (+) Bruit/Thrill   Filed Weights   06/21/21 1145 06/22/21 0400 06/24/21 0751  Weight: 116.5 kg 117.8 kg 120.1 kg    Intake/Output Summary (Last 24 hours) at 06/24/2021 0940 Last data filed at 06/23/2021 9892 Gross per 24 hour  Intake 240 ml  Output 0 ml  Net 240 ml     Additional Objective Labs: Basic Metabolic Panel: Recent Labs  Lab 06/22/21 0044 06/23/21 0057 06/24/21 0056  NA 131* 133* 127*  K 3.8 3.8 3.8  CL 95* 94* 90*  CO2 28 29 25   GLUCOSE 91 205* 176*  BUN 21 37* 56*  CREATININE 3.66* 4.86* 6.14*  CALCIUM 8.0* 8.1* 8.0*  PHOS 3.7 4.3 4.7*    Liver Function Tests: Recent Labs  Lab 06/19/21 0135 06/20/21 0256 06/21/21 0120 06/22/21 0044 06/23/21 0057 06/24/21 0056  AST 27 24 29   --   --   --   ALT 5 5 5   --   --   --   ALKPHOS 89 109 120  --   --   --   BILITOT 0.3 0.5 0.7  --   --   --   PROT 6.4* 6.2* 6.6  --   --   --   ALBUMIN 2.0*   2.0* 2.0*   2.0* 2.2*   2.2* 2.0* 2.0* 2.1*    No results for input(s): LIPASE, AMYLASE in the last 168 hours. CBC: Recent Labs  Lab 06/20/21 0256 06/21/21 0147 06/22/21 0044 06/23/21 0057 06/24/21 0056  WBC 5.8 7.0 5.8 7.6 7.9  NEUTROABS 4.5 5.7 4.4 5.9 6.1  HGB 7.3* 7.8* 7.3*  7.1* 6.6*  HCT 24.9* 27.0* 25.3* 24.3* 22.4*  MCV 95.4 96.8 97.7 98.0 97.8  PLT 179 184 177 161 150    Blood Culture    Component Value Date/Time   SDES BLOOD RIGHT ARM 06/15/2021 0919   SPECREQUEST  06/15/2021 0919    BOTTLES DRAWN AEROBIC AND ANAEROBIC Blood Culture adequate volume   CULT  06/15/2021 0919    NO GROWTH 5 DAYS Performed at [facility], [place] [address], [place], [place] [address]    REPTSTATUS 06/20/2021 FINAL 06/15/2021 0919    Cardiac Enzymes: No results for input(s): CKTOTAL, CKMB, CKMBINDEX, TROPONINI in the last 168 hours. CBG: Recent Labs  Lab 06/23/21 0843 06/23/21 1310 06/23/21 1651 06/23/21 2309 06/24/21 0730  GLUCAP 194* 205* 215* 222* 167*    Iron Studies: No results for input(s): IRON, TIBC, TRANSFERRIN, FERRITIN in the last 72 hours. Lab Results  Component Value Date   INR 1.2 05/19/2019   INR 1.2 05/18/2019   INR 1.2 05/17/2019  Studies/Results: No results found.  Medications:  sodium chloride     sodium chloride      ceFAZolin (ANCEF) IV Stopped (06/21/21 1249)    sodium chloride   Intravenous Once   amitriptyline  100 mg Oral QHS   apixaban  5 mg Oral BID   atorvastatin  80 mg Oral QPM   chlorhexidine  15 mL Mouth Rinse BID   Chlorhexidine Gluconate Cloth  6 each Topical Q0600   darbepoetin (ARANESP) injection - DIALYSIS  200 mcg Intravenous Q Wed-HD   insulin aspart  0-6 Units Subcutaneous TID WC   insulin glargine-yfgn  10 Units Subcutaneous QHS   levothyroxine  100 mcg Oral QAC breakfast   lidocaine-prilocaine  1 application Topical Q M,W,F   mouth rinse  15 mL Mouth Rinse q12n4p   metoprolol tartrate  12.5 mg Oral BID   midodrine  5 mg Oral TID WC   multivitamin with minerals  1 tablet Oral Daily   sertraline  100 mg Oral Daily    Dialysis Orders: [facility] MWF  4h  450 bfr  123kg  2/2 bath  LU AVF   Hep none  - mircera 225 mcg every 2 weeks - last received 200 mcg on 12/14  - venofer 100 mg (ordered for 10  doses)  - no vdra   Assessment/Plan: AHRF. Volume overload. Pulm edema on admit. Continue volume removal with HD. Now below OP dry weight. Will have lower EDW at discharge.  Recent COVID pna - was on vent 1/05 to 1/08. Unasyn started 1/16 ?aspiration PNA  ESRD - HD MWF.  Continue on schedule. HD today.  BP/ volume - Soft blood pressures this admission. Midordine 5mg  TID started. Volume improving, now below EDW.  Anemia ckd - Hgb 6.6 --For 2 u prbcs today. Continue Aranesp 200 q Wed.  Tsat 12% Ferritin 638. S/p 1g  IV Fe load here.  MBD ckd - cont auryxia ac.  Atrial Fibrillation w/ RVR - Rate controlled. On Eliquis, metoprolol  MSSA bacteremia. Prior to this admission. On cefazolin stop date 07/17/21.  New cavitary lesion in RUL on CT. Seen by PCCM and felt consistent with septic pulmonary embolus. Plan for 6 weeks Augmentin at discharge  Dispo: D/C to SNF   Lynnda Child PA-C [facility] 06/24/2021,9:42 AM

## 2021-06-24 NOTE — Progress Notes (Signed)
PROGRESS NOTE                                                                                                                                                                                                             Patient Demographics:    Richard Hall, is a 66 y.o. male, DOB - [birth year], IWP:[id number]  Outpatient Primary MD for the patient is [name], MD    LOS - 9  Admit date - 06/15/2021    Chief Complaint  Patient presents with   Shortness of Breath       Brief Narrative (HPI from H&P)    Richard Hall is a 66 y.o. male with medical history significant of hypertension, atrial fibrillation, congestive heart failure, CVA, ESRD on HD, and prostate cancer presents with complaints of shortness of breath.  Patient had just recently been hospitalized from 12/30-1/13 after initially presenting with septic shock related to MSSA bacteremia and COVID-19 viral pneumonia.  During his hospital stay patient required intubation.  ID had been consulted and patient was recommended to complete 6 weeks of cefazolin to be completed on 07/17/2021.  He went to the rehab facility but soon was sent back to the hospital for hypoxia, in the ER his work-up was suggestive of possible continued pneumonia, troponin, cardiology was consulted and he was admitted for further care.   Subjective:   Patient in bed, appears comfortable, denies any headache, no fever, no chest pain or pressure, no shortness of breath , no abdominal pain. No new focal weakness.   Assessment  & Plan :   Acute Hypoxic Resp. Failure due to Acute on chronic diastolic CHF in the presence of severe MR, EF 60% on recent echo with NSTEMI in a patient with ESRD and recent COVID-19 pneumonia with a new cavitary lesion on CT scan ?  Chronic intermittent microaspiration - no sepsis this admission, however does have leukocytosis with a cavitary lesion on CT scan raising the  suspicion for possible underlying aspiration, SLP following, also continue fluid removal through HD, renal and cardiology on board will also request pulmonary to opine on his cavitary lung lesion. CRP and procalcitonin also point towards a bacterial infectious etiology as well.  He was briefly placed on Unasyn with excellent clinical improvement, cultures here this admission negative thus far.  Have been consulted ID  discussed the plan with pulmonary on 06/20/2021 plan is to continue cefazolin till 07/17/2021 thereafter outpatient ID and pulmonary follow-up.  He is currently on cefazolin alone, plan is to dialyze him 1 more time there after discharge back to SNF most likely on 06/24/2021.   Encouraged to sit up in chair in the daytime use I-S and flutter valve for pulmonary toiletry.  Will advance activity and titrate down oxygen as possible.  He is on cefazolin for recent MSSA bacteremia with stop date of 07/17/2021 (pharm confirmed)    2.  ESRD.  Renal on board for fluid removal through HD.  3.  Recent MSSA bacteremia.  Cefazolin with a stop date of 07/17/2021 seen by ID this admission.  4.  Dysphagia.  Speech therapy following currently on dysphagia 2 diet.  Kindly see #1 above.  5.  Hypotension.  Blood pressure has stabilized and improving titrate off midodrine.  6.  Obesity with OSA.  BMI 35 follow with PCP for weight loss nightly CPAP.  Counseled on compliance  7.  Recent left ankle fracture.  Nonweightbearing left leg per last admission, outpatient orthopedics follow-up.  PT OT.  8.  Hypothyroidism.  On Synthroid   9.  Chronic A. fib [place] vas 2 score of greater than 4.  On Eliquis continue low-dose beta-blocker as tolerated by blood pressure.    10.  AOCD.  Monitor, improving with fluid removal through HD, type screen done if drops below 7 transfuse, he has consented for transfusion, will get 2 units of packed RBC with HD on 06/24/2021.  No signs of active bleeding.  11.  Dyslipidemia.  On  Synthroid continue  12. History of prostate cancer - Continue Xtandi.  13.  Elevated troponin.  Likely demand ischemia in the setting of ESRD, demand mismatch caused by hypoxia from #1 above, chest pain-free.  Cardiology on board, on Eliquis, statin, low-dose beta-blocker as tolerated by blood pressure and monitor  14.  Severe MR.  Outpatient cardiology follow-up.   15. DM type II.  On Lantus and sliding scale.  Monitor and adjust  Lab Results  Component Value Date   HGBA1C 6.1 (H) 06/01/2021    CBG (last 3)  Recent Labs    06/23/21 1651 06/23/21 2309 06/24/21 0730  GLUCAP 215* 222* 167*           Condition - Extremely Guarded  Family Communication  : [name] [phone] on 06/17/21, 06/18/21, 06/19/21, 06/22/21 message left at 10:57 AM, 06/24/21 at 9:42 AM message left  Code Status : Full code  Consults  : Cardiology, Pulmonary  PUD Prophylaxis :    Procedures  :     CT scan - 1. The appearance of the chest suggest congestive heart failure, as above. 2. In addition, there is a new thick-walled cavitary nodule measuring 1.4 x 1.2 cm in the right upper lobe. This is potentially infectious or inflammatory in etiology, however, underlying neoplasm is not excluded. Short-term follow-up repeat noncontrast chest CT is recommended in 2-3 months to ensure the stability or regression of this lesion. 3. Mildly enlarged right paratracheal lymph node, nonspecific in the setting of presumed congestive heart failure, but concerning in light of the right upper lobe pulmonary nodule. Attention at time of repeat CT examination is recommended to ensure stability/regression. 4. Diffuse sclerotic lesions noted throughout the visualized axial and appendicular skeleton, presumably reflective of widespread osseous metastasis in this patient with history of prostate cancer. 5. Aortic atherosclerosis, in addition to left main and  three-vessel coronary artery disease. Please note that although the  presence of coronary artery calcium documents the presence of coronary artery disease, the severity of this disease and any potential stenosis cannot be assessed on this non-gated CT examination. Assessment for potential risk factor modification, dietary therapy or pharmacologic therapy may be warranted, if clinically indicated. 6. There are calcifications of the aortic valve. Echocardiographic correlation for evaluation of potential valvular dysfunction may be warranted if clinically indicated. Aortic Atherosclerosis (ICD10-I70.0).      Disposition Plan  :    Status is: Inpatient  Remains inpatient appropriate because: Acute hypoxic respiratory failure  DVT Prophylaxis  :     apixaban (ELIQUIS) tablet 5 mg     Lab Results  Component Value Date   PLT 150 06/24/2021    Diet :  Diet Order             DIET DYS 3 Room service appropriate? Yes; Fluid consistency: Thin  Diet effective now                    Inpatient Medications  Scheduled Meds:  sodium chloride   Intravenous Once   amitriptyline  100 mg Oral QHS   apixaban  5 mg Oral BID   atorvastatin  80 mg Oral QPM   chlorhexidine  15 mL Mouth Rinse BID   Chlorhexidine Gluconate Cloth  6 each Topical Q0600   darbepoetin (ARANESP) injection - DIALYSIS  200 mcg Intravenous Q Wed-HD   insulin aspart  0-6 Units Subcutaneous TID WC   insulin glargine-yfgn  10 Units Subcutaneous QHS   levothyroxine  100 mcg Oral QAC breakfast   lidocaine-prilocaine  1 application Topical Q M,W,F   mouth rinse  15 mL Mouth Rinse q12n4p   metoprolol tartrate  12.5 mg Oral BID   midodrine  5 mg Oral TID WC   multivitamin with minerals  1 tablet Oral Daily   sertraline  100 mg Oral Daily   Continuous Infusions:  sodium chloride     sodium chloride      ceFAZolin (ANCEF) IV Stopped (06/21/21 1249)   PRN Meds:.sodium chloride, sodium chloride, acetaminophen **OR** acetaminophen, albuterol, alteplase, diphenhydrAMINE,  diphenoxylate-atropine, guaiFENesin, heparin, lidocaine (PF), lidocaine-prilocaine, ondansetron (ZOFRAN) IV, pentafluoroprop-tetrafluoroeth     Time Spent in minutes  30   Lala Lund M.D on 06/24/2021 at 9:40 AM  To page go to [email or web address]   [facility] -  Office  [phone]  See all Orders from today for further details    Objective:   Vitals:   06/24/21 0751 06/24/21 0803 06/24/21 0830 06/24/21 0900  BP: (!) 106/57 107/61 (!) 113/56 103/76  Pulse: 70 68 81 77  Resp: (!) 22 19 20 20   Temp: (!) 97.4 F (36.3 C)     TempSrc: Temporal     SpO2: 96% 100% 100% 96%  Weight: 120.1 kg     Height:        Wt Readings from Last 3 Encounters:  06/24/21 120.1 kg  06/14/21 117.6 kg  07/26/20 125.3 kg     Intake/Output Summary (Last 24 hours) at 06/24/2021 0940 Last data filed at 06/23/2021 0932 Gross per 24 hour  Intake 240 ml  Output 0 ml  Net 240 ml      Physical Exam  Awake Alert, No new F.N deficits, Normal affect [place].AT,PERRAL Supple Neck, No JVD,   Symmetrical Chest wall movement, Good air movement bilaterally, CTAB RRR,No Gallops, Rubs or new Murmurs,  +ve B.Sounds, Abd  Soft, No tenderness,   No Cyanosis, Clubbing or edema     RN pressure injury documentation: Pressure Injury 06/11/21 Coccyx Medial Stage 2 -  Partial thickness loss of dermis presenting as a shallow open injury with a red, pink wound bed without slough. (Active)  06/11/21 2159  Location: Coccyx  Location Orientation: Medial  Staging: Stage 2 -  Partial thickness loss of dermis presenting as a shallow open injury with a red, pink wound bed without slough.  Wound Description (Comments):   Present on Admission:      Data Review:   Recent Labs  Lab 06/20/21 0256 06/21/21 0147 06/22/21 0044 06/23/21 0057 06/24/21 0056  WBC 5.8 7.0 5.8 7.6 7.9  HGB 7.3* 7.8* 7.3* 7.1* 6.6*  HCT 24.9* 27.0* 25.3* 24.3* 22.4*  PLT 179 184 177 161 150  MCV 95.4 96.8 97.7 98.0 97.8  MCH  28.0 28.0 28.2 28.6 28.8  MCHC 29.3* 28.9* 28.9* 29.2* 29.5*  RDW 17.8* 18.5* 19.2* 20.1* 21.0*  LYMPHSABS 0.6* 0.6* 0.7 0.7 0.7  MONOABS 0.5 0.5 0.5 0.7 0.9  EOSABS 0.1 0.1 0.1 0.1 0.1  BASOSABS 0.0 0.1 0.0 0.0 0.0    Recent Labs  Lab 06/18/21 0133 06/19/21 0135 06/20/21 0256 06/21/21 0120 06/21/21 0146 06/21/21 0147 06/22/21 0044 06/23/21 0057 06/24/21 0056  NA 132*   133* 130* 134* 132*  --   --  131* 133* 127*  K 3.9   3.9 3.9 4.0 4.4  --   --  3.8 3.8 3.8  CL 94*   95* 93* 95* 95*  --   --  95* 94* 90*  CO2 26   27 27 29 24   --   --  28 29 25   GLUCOSE 157*   155* 163* 134* 138*  --   --  91 205* 176*  BUN 40*   40* 49* 24* 31*  --   --  21 37* 56*  CREATININE 4.77*   4.70* 6.00* 4.04* 5.21*  --   --  3.66* 4.86* 6.14*  CALCIUM 7.6*   7.6* 7.7* 7.8* 8.3*  --   --  8.0* 8.1* 8.0*  AST 18 27 24 29   --   --   --   --   --   ALT <5 5 5 5   --   --   --   --   --   ALKPHOS 84 89 109 120  --   --   --   --   --   BILITOT 0.4 0.3 0.5 0.7  --   --   --   --   --   ALBUMIN 1.9*   2.0* 2.0*   2.0* 2.0*   2.0* 2.2*   2.2*  --   --  2.0* 2.0* 2.1*  MG 2.3 2.4  --   --   --   --   --   --   --   CRP 10.1* 8.2* 6.2*  --   --  6.2* 5.1* 5.9*  --   BNP 1,929.3* 1,938.6* 2,017.9*  --  1,612.0*  --  1,418.4* 1,284.6*  --      Radiology Reports No results found.

## 2021-06-24 NOTE — Progress Notes (Signed)
SLP Cancellation Note  Patient Details Name: Marcelis Wissner MRN: [id number] DOB: Jan 03, 1956   Cancelled treatment:       Reason Eval/Treat Not Completed: Patient at procedure or test/unavailable  Unable to complete assessment of diet tolerance, as pt is currently off unit in dialysis. Will continue efforts.   Abdulahad Mederos B. Quentin Ore, [facility], [place] Speech Language Pathologist Office: [phone]  Shonna Chock 06/24/2021, 10:39 AM

## 2021-06-24 NOTE — Progress Notes (Signed)
OT Cancellation Note  Patient Details Name: Richard Hall MRN: [id number] DOB: 01/26/1956   Cancelled Treatment:    Reason Eval/Treat Not Completed: Patient at procedure or test/ unavailable. Pt at HD this morning, OT will follow up next available time  [name] 06/24/2021, 9:00 AM

## 2021-06-24 NOTE — Plan of Care (Signed)

## 2021-06-24 NOTE — Progress Notes (Signed)
Cpap refused 

## 2021-06-24 NOTE — TOC Progression Note (Signed)
Transition of Care [facility]) - Progression Note    Patient Details  Name: Richard Hall MRN: [id number] Date of Birth: 05-15-1956  Transition of Care [facility]) CM/SW [place], LCSW Phone Number: 06/24/2021, 2:44 PM  Clinical Narrative:    Insurance approval received for [place] effective 06/22/2021-06/25/2021, Ref# [id number]. Requested COVID test for preparation of potential discharge tomorrow.   Expected Discharge Plan: [place] Barriers to Discharge: Continued Medical Work up  Expected Discharge Plan and Services Expected Discharge Plan: [place] In-house Referral: Clinical Social Work   Post Acute Care Choice: [place] Living arrangements for the past 2 months: Single Family Home                                       Social Determinants of Health (SDOH) Interventions    Readmission Risk Interventions Readmission Risk Prevention Plan 06/21/2021 01/12/2019  Transportation Screening Complete Complete  PCP or Specialist Appt within 3-5 Days - Complete  HRI or [place] - Complete  Social Work Consult for [place] Planning/Counseling - Complete  Palliative Care Screening - Not Applicable  Medication Review Press photographer) Complete Complete  PCP or Specialist appointment within 3-5 days of discharge Complete -  [place] or Home Care Consult Complete -  SW Recovery Care/Counseling Consult Complete -  Palliative Care Screening Not Applicable -  Skilled Nursing Facility Complete -  Some recent data might be hidden

## 2021-06-25 ENCOUNTER — Inpatient Hospital Stay (HOSPITAL_COMMUNITY): Payer: Medicare Other

## 2021-06-25 DIAGNOSIS — D508 Other iron deficiency anemias: Secondary | ICD-10-CM

## 2021-06-25 LAB — CBC
HCT: 24.6 % — ABNORMAL LOW (ref 39.0–52.0)
HCT: 24 % — ABNORMAL LOW (ref 39.0–52.0)
HCT: 24.4 % — ABNORMAL LOW (ref 39.0–52.0)
HCT: 26.7 % — ABNORMAL LOW (ref 39.0–52.0)
Hemoglobin: 7.5 g/dL — ABNORMAL LOW (ref 13.0–17.0)
Hemoglobin: 7.3 g/dL — ABNORMAL LOW (ref 13.0–17.0)
Hemoglobin: 7.1 g/dL — ABNORMAL LOW (ref 13.0–17.0)
Hemoglobin: 8.3 g/dL — ABNORMAL LOW (ref 13.0–17.0)
MCH: 29.4 pg (ref 26.0–34.0)
MCH: 29.2 pg (ref 26.0–34.0)
MCH: 28.1 pg (ref 26.0–34.0)
MCH: 30.2 pg (ref 26.0–34.0)
MCHC: 30.5 g/dL (ref 30.0–36.0)
MCHC: 30.4 g/dL (ref 30.0–36.0)
MCHC: 29.1 g/dL — ABNORMAL LOW (ref 30.0–36.0)
MCHC: 31.1 g/dL (ref 30.0–36.0)
MCV: 96.5 fL (ref 80.0–100.0)
MCV: 96 fL (ref 80.0–100.0)
MCV: 96.4 fL (ref 80.0–100.0)
MCV: 97.1 fL (ref 80.0–100.0)
Platelets: 128 10*3/uL — ABNORMAL LOW (ref 150–400)
Platelets: 126 10*3/uL — ABNORMAL LOW (ref 150–400)
Platelets: 134 10*3/uL — ABNORMAL LOW (ref 150–400)
Platelets: 136 10*3/uL — ABNORMAL LOW (ref 150–400)
RBC: 2.55 MIL/uL — ABNORMAL LOW (ref 4.22–5.81)
RBC: 2.5 MIL/uL — ABNORMAL LOW (ref 4.22–5.81)
RBC: 2.53 MIL/uL — ABNORMAL LOW (ref 4.22–5.81)
RBC: 2.75 MIL/uL — ABNORMAL LOW (ref 4.22–5.81)
RDW: 21.8 % — ABNORMAL HIGH (ref 11.5–15.5)
RDW: 21.2 % — ABNORMAL HIGH (ref 11.5–15.5)
RDW: 21.2 % — ABNORMAL HIGH (ref 11.5–15.5)
RDW: 20.6 % — ABNORMAL HIGH (ref 11.5–15.5)
WBC: 6.4 10*3/uL (ref 4.0–10.5)
WBC: 6 10*3/uL (ref 4.0–10.5)
WBC: 5.8 10*3/uL (ref 4.0–10.5)
WBC: 7.7 10*3/uL (ref 4.0–10.5)
nRBC: 0.3 % — ABNORMAL HIGH (ref 0.0–0.2)
nRBC: 0 % (ref 0.0–0.2)
nRBC: 0.3 % — ABNORMAL HIGH (ref 0.0–0.2)
nRBC: 0.3 % — ABNORMAL HIGH (ref 0.0–0.2)

## 2021-06-25 LAB — RENAL FUNCTION PANEL
Albumin: 2.1 g/dL — ABNORMAL LOW (ref 3.5–5.0)
Anion gap: 8 (ref 5–15)
BUN: 29 mg/dL — ABNORMAL HIGH (ref 8–23)
CO2: 29 mmol/L (ref 22–32)
Calcium: 8.1 mg/dL — ABNORMAL LOW (ref 8.9–10.3)
Chloride: 94 mmol/L — ABNORMAL LOW (ref 98–111)
Creatinine, Ser: 4.18 mg/dL — ABNORMAL HIGH (ref 0.61–1.24)
GFR, Estimated: 15 mL/min — ABNORMAL LOW (ref >60)
Glucose, Bld: 137 mg/dL — ABNORMAL HIGH (ref 70–99)
Phosphorus: 4 mg/dL (ref 2.5–4.6)
Potassium: 4.1 mmol/L (ref 3.5–5.1)
Sodium: 131 mmol/L — ABNORMAL LOW (ref 135–145)

## 2021-06-25 LAB — DIC (DISSEMINATED INTRAVASCULAR COAGULATION)PANEL
D-Dimer, Quant: 1.29 ug/mL-FEU — ABNORMAL HIGH (ref 0.00–0.50)
Fibrinogen: 767 mg/dL — ABNORMAL HIGH (ref 210–475)
INR: 1.7 — ABNORMAL HIGH (ref 0.8–1.2)
Platelets: 130 10*3/uL — ABNORMAL LOW (ref 150–400)
Prothrombin Time: 20.2 seconds — ABNORMAL HIGH (ref 11.4–15.2)
Smear Review: NONE SEEN
aPTT: 54 seconds — ABNORMAL HIGH (ref 24–36)

## 2021-06-25 LAB — LACTATE DEHYDROGENASE: LDH: 117 U/L (ref 98–192)

## 2021-06-25 LAB — GLUCOSE, CAPILLARY
Glucose-Capillary: 158 mg/dL — ABNORMAL HIGH (ref 70–99)
Glucose-Capillary: 205 mg/dL — ABNORMAL HIGH (ref 70–99)
Glucose-Capillary: 152 mg/dL — ABNORMAL HIGH (ref 70–99)
Glucose-Capillary: 176 mg/dL — ABNORMAL HIGH (ref 70–99)

## 2021-06-25 LAB — PREPARE RBC (CROSSMATCH)

## 2021-06-25 MED ORDER — APIXABAN 2.5 MG PO TABS: 2.5000 mg | ORAL_TABLET | Freq: Two times a day (BID) | ORAL | Status: DC

## 2021-06-25 MED ORDER — SODIUM CHLORIDE 0.9% IV SOLUTION: Freq: Once | INTRAVENOUS | Status: AC

## 2021-06-25 MED ORDER — PANTOPRAZOLE SODIUM 40 MG IV SOLR
40.0000 mg | Freq: Two times a day (BID) | INTRAVENOUS | Status: DC
  Administered 2021-06-25 – 2021-07-02 (×15): 40 mg via INTRAVENOUS
  Filled 2021-06-25 (×15): qty 40

## 2021-06-25 MED ORDER — MIDODRINE HCL 5 MG PO TABS
10.0000 mg | ORAL_TABLET | Freq: Three times a day (TID) | ORAL | Status: DC
  Administered 2021-06-25 – 2021-07-02 (×20): 10 mg via ORAL
  Filled 2021-06-25 (×20): qty 2

## 2021-06-25 NOTE — Progress Notes (Signed)
[facility] Progress Note   Subjective:    Seen in room. No cp,dyspnea. Transfused 2u prbcs yesterday. Hgb 7.3   Objective Vitals:   06/24/21 2037 06/25/21 0500 06/25/21 0754 06/25/21 1215  BP: 117/61  121/62 108/63  Pulse: 76  71 66  Resp: 19  (!) 22 19  Temp: 97.7 F (36.5 C)  (!) 97.4 F (36.3 C) 97.6 F (36.4 C)  TempSrc: Oral  Axillary Axillary  SpO2: 94%  95% 96%  Weight:  121.2 kg    Height:       Physical Exam General: Sitting up beside the bed; NAD Heart: Normal S1 and S2; No murmurs, gallops, or rubs Lungs: Clear anteriorly and laterally; No wheezing, rales, or rhonchi Abdomen: Large, soft, and non-tender Extremities: 1+ edema noted LLE, no edema bilateral thighs or RLE Dialysis Access:  L AVF (+) Bruit/Thrill   Filed Weights   06/22/21 0400 06/24/21 0751 06/25/21 0500  Weight: 117.8 kg 120.1 kg 121.2 kg    Intake/Output Summary (Last 24 hours) at 06/25/2021 1310 Last data filed at 06/25/2021 1116 Gross per 24 hour  Intake 10 ml  Output --  Net 10 ml     Additional Objective Labs: Basic Metabolic Panel: Recent Labs  Lab 06/23/21 0057 06/24/21 0056 06/25/21 0159  NA 133* 127* 131*  K 3.8 3.8 4.1  CL 94* 90* 94*  CO2 29 25 29   GLUCOSE 205* 176* 137*  BUN 37* 56* 29*  CREATININE 4.86* 6.14* 4.18*  CALCIUM 8.1* 8.0* 8.1*  PHOS 4.3 4.7* 4.0    Liver Function Tests: Recent Labs  Lab 06/19/21 0135 06/20/21 0256 06/21/21 0120 06/22/21 0044 06/23/21 0057 06/24/21 0056 06/25/21 0159  AST 27 24 29   --   --   --   --   ALT 5 5 5   --   --   --   --   ALKPHOS 89 109 120  --   --   --   --   BILITOT 0.3 0.5 0.7  --   --   --   --   PROT 6.4* 6.2* 6.6  --   --   --   --   ALBUMIN 2.0*   2.0* 2.0*   2.0* 2.2*   2.2*   < > 2.0* 2.1* 2.1*   < > = values in this interval not displayed.    No results for input(s): LIPASE, AMYLASE in the last 168 hours. CBC: Recent Labs  Lab 06/22/21 0044 06/23/21 0057 06/24/21 0056  06/25/21 0609 06/25/21 0927 06/25/21 1208  WBC 5.8 7.6 7.9 6.4 6.0  --   NEUTROABS 4.4 5.9 6.1  --   --   --   HGB 7.3* 7.1* 6.6* 7.5* 7.3*  --   HCT 25.3* 24.3* 22.4* 24.6* 24.0*  --   MCV 97.7 98.0 97.8 96.5 96.0  --   PLT 177 161 150 128* 126* PENDING    Blood Culture    Component Value Date/Time   SDES BLOOD RIGHT ARM 06/15/2021 0919   SPECREQUEST  06/15/2021 0919    BOTTLES DRAWN AEROBIC AND ANAEROBIC Blood Culture adequate volume   CULT  06/15/2021 0919    NO GROWTH 5 DAYS Performed at [facility], [place] [address], [place], [place] [address]    REPTSTATUS 06/20/2021 FINAL 06/15/2021 0919    Cardiac Enzymes: No results for input(s): CKTOTAL, CKMB, CKMBINDEX, TROPONINI in the last 168 hours. CBG: Recent Labs  Lab 06/24/21 1412 06/24/21 1609  06/24/21 2052 06/25/21 0753 06/25/21 1215  GLUCAP 141* 134* 137* 158* 205*    Iron Studies: No results for input(s): IRON, TIBC, TRANSFERRIN, FERRITIN in the last 72 hours. Lab Results  Component Value Date   INR 1.7 (H) 06/25/2021   INR 1.2 05/19/2019   INR 1.2 05/18/2019   Studies/Results: CT ABDOMEN PELVIS WO CONTRAST  Result Date: 06/25/2021 CLINICAL DATA:  Coronavirus pneumonia. Anemia. Anemia is worsening. EXAM: CT ABDOMEN AND PELVIS WITHOUT CONTRAST TECHNIQUE: Multidetector CT imaging of the abdomen and pelvis was performed following the standard protocol without IV contrast. RADIATION DOSE REDUCTION: This exam was performed according to the departmental dose-optimization program which includes automated exposure control, adjustment of the mA and/or kV according to patient size and/or use of iterative reconstruction technique. COMPARISON:  Chest CT 06/16/2021.  CT pelvis 05/19/2019 FINDINGS: Lower chest: Moderate bilateral effusions layering dependently with dependent atelectasis. Non dependent lung appears largely clear. The heart is enlarged. There is a small amount pericardial fluid. Hepatobiliary: Liver  parenchyma appears normal without contrast. No calcified gallstones. Pancreas: Normal Spleen: Normal Adrenals/Urinary Tract: Adrenal glands are normal. The kidneys are atrophic. Simple cyst on both sides. No hydronephrosis. No visible bladder abnormality. Stomach/Bowel: Stomach and small intestine are normal. No visible colon pathology. Vascular/Lymphatic: Aortic atherosclerosis. No aneurysm. IVC is normal. No adenopathy. No retroperitoneal hematoma. Reproductive: Normal Other: No free fluid or air.  No evidence of body wall hemorrhage. Musculoskeletal: Diffusely abnormal appearance of the bones, with a mixed lytic and sclerotic pattern most often associated with widespread malignancy in this patient with a history of metastatic prostate cancer. No evidence of dominant bone lesion. No definite evidence of progression when compared to a CT scan of May 19, 2019. IMPRESSION: No hemorrhage or hematoma seen to explain worsening anemia. Bilateral pleural effusions layering dependently with dependent pulmonary atelectasis. Cardiomegaly. Aortic Atherosclerosis (ICD10-I70.0). Bilateral renal atrophy. 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 No visible progression since a study of 2020. Electronically Signed   By: Nelson Chimes M.D.   On: 06/25/2021 08:54    Medications:   ceFAZolin (ANCEF) IV 2 g (06/24/21 1308)    amitriptyline  100 mg Oral QHS   [START ON 06/27/2021] apixaban  2.5 mg Oral BID   atorvastatin  80 mg Oral QPM   chlorhexidine  15 mL Mouth Rinse BID   Chlorhexidine Gluconate Cloth  6 each Topical Q0600   darbepoetin (ARANESP) injection - DIALYSIS  200 mcg Intravenous Q Wed-HD   insulin aspart  0-6 Units Subcutaneous TID WC   insulin glargine-yfgn  10 Units Subcutaneous QHS   levothyroxine  100 mcg Oral QAC breakfast   lidocaine-prilocaine  1 application Topical Q M,W,F   mouth rinse  15 mL Mouth Rinse q12n4p   metoprolol  tartrate  12.5 mg Oral BID   midodrine  10 mg Oral TID WC   multivitamin with minerals  1 tablet Oral Daily   pantoprazole (PROTONIX) IV  40 mg Intravenous Q12H   sertraline  100 mg Oral Daily    Dialysis Orders: [facility] MWF  4h  450 bfr  123kg  2/2 bath  LU AVF   Hep none  - mircera 225 mcg every 2 weeks - last received 200 mcg on 12/14  - venofer 100 mg (ordered for 10 doses)  - no vdra   Assessment/Plan: AHRF. Volume overload. Pulm edema on admit. Continue volume removal with HD. Now below OP dry weight. Will have lower  EDW at discharge 117kg.  Recent COVID pna - was on vent 1/05 to 1/08. Unasyn started 1/16 ?aspiration PNA  ESRD - HD MWF.  Continue on schedule. Next HD 1/25 BP/ volume - Soft blood pressures this admission. [place] increased 10mg  TID. Volume improving, now below EDW.  Anemia ckd - Hgb 7.3 --s/p 2 u prbcs 1/23. Continue Aranesp 200 q Wed.  Tsat 12% Ferritin 638. Iron replete -s/p 1g  IV Fe load here.  MBD ckd - cont auryxia ac.  Atrial Fibrillation w/ RVR - Rate controlled. On Eliquis, metoprolol  MSSA bacteremia. Prior to this admission. On cefazolin stop date 07/17/21.  New cavitary lesion in RUL on CT. Seen by PCCM and felt consistent with septic pulmonary embolus. Plan for 6 weeks Augmentin at discharge  Dispo: D/C to SNF when stable.   Richard Child PA-C [facility] 06/25/2021,1:10 PM

## 2021-06-25 NOTE — Consult Note (Signed)
[facility] Consult: 3:33 PM 06/25/2021  LOS: 10 days    Referring Provider: Dr Candiss Norse  Primary Care Physician:  [name], MD in [place] Primary Gastroenterologist:  Osborne Oman in [place].      Reason for Consultation:  anemia.     HPI: Richard Hall is a 65 y.o. male.  PMH Metastatic prostate cancer.  End-stage renal disease, on HD.  CHF.  IDA.  Hypothyroidism.  Morbid obesity.  T2DM with peripheral neuropathy.  OSA.  Chronic respiratory failure with hypoxia/hypercapnia.  Diastolic CHF.  06/6107 TURP.  04/2021 watchman procedure.  Paroxysmal A. fib.  On Eliquis.  Chronic anemia.  CVA.  Severe mitral regurgitation.  09/2017 colonoscopy by Janus Molder, MD, [facility].  "Normal". 09/2017 EGD.  Minor gastritis. VCE done fall 2019: showing 1 AVM. TTG IgA, TTG IgG normal 12/2017.   01/2021 CT abdomen pelvis with and without contrast showed 1.5 cm hypodensity right lobe of liver, possibly small hemangioma or other benign process. 4 PRBCs in 01/2019.    Seen by [facility] hematology in 08/2018 regarding IDA.  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  He was treated with IV iron and oral iron.  Ultimately hematology suggesting stopping oral iron and reevaluating labs in 3 months.  Per renal notes recieves Mircera every 2 weeks.   12/30 -06/14/21 admission, discharged to SNF.  Diagnosis of MSSA bacteremia, septic shock, COVID-19 pneumonia, rapid atrial fibrillation.  Required vent, cortrak FT. Hgb 6.9 to 9.9, received 2 PRBCs. Discharged to finish 6 weeks of cefazolin on 1/19. NG feeding tube was in place until 1/13 when he was to start recommended D2 diet.  Readmitted to ED 06/15/2021 with hypoxia.  A. fib is rate controlled, troponin/BNP  elevated.  X-ray confirming worsening Multi lobar, cavitary, bilateral pneumonia.  Pulmonary edema improved with hemodialysis.  Another issue is recent left ankle fracture.  Has been receiving Eliquis, last dose was 1/23 in the evening, now on hold for next 3 doses.  Pantoprazole 40 IV bid initiated this afternoon.  Received for ferrlecit 1/17 thru 1/20.    Received 2  PRBCs 06/24/21 when Hgb 6.6, down from 7.4.  MCV mid 90s.  Low iron at 26, low TIBC, iron sats.  Ferritin elevated 638.  No  folate or b12.   FOBT not assayed.   INR 1.7. BNP 2017.  Troponins 1119. CTAP, noncontrast today shows no hemorrhaging or hematoma to explain progressive anemia.  There is widespread mixed lytic and sclerotic bone mets of prostate cancer, no progression since previous study 2020.  Bilateral pleural effusions and atelectasis.  Pt denies abd pain, dysphagia, nausea, vomiting..  Stools black for a year.  Last bowel movement yesterday.  Treats occasional constipation with Linzess.  Family history of heart disease in a sister, brother.  Stroke in sister, brother.  Diabetes in sister, brother.  No history of colon cancer or polyps.    Past Medical History:  Diagnosis Date   A-V fistula ([place])    left arm  Anemia    Arthritis    CHF (congestive heart failure) (HCC)    CVA (cerebral vascular accident) ([place]) 2010   Diabetes mellitus with end-stage renal disease ([place])    Dyalisis pt.   Diabetic neuropathy (HCC)    Bilateral feet   Dyslipidemia    Dysrhythmia    A-fib   Essential hypertension    Hepatitis    childhood   History of blood transfusion    Hypothyroidism (acquired)    OSA on CPAP    Prostate cancer [facility])    Uses wheelchair     Past Surgical History:  Procedure Laterality Date   APPENDECTOMY     COLONOSCOPY     FEMUR IM NAIL Left 05/20/2019   Procedure: Intramedullary (Im) Retrograde Femoral Nailing for impending pathologic fracture;  Surgeon: Altamese Clearmont, MD;  Location: [facility];   Service: Orthopedics;  Laterality: Left;   ORIF FEMUR FRACTURE Left 05/20/2019   Procedure: OPEN REDUCTION INTERNAL FIXATION (ORIF) DISTAL FEMUR FRACTURE with intercondylar extension;  Surgeon: Altamese Santiago, MD;  Location: [facility];  Service: Orthopedics;  Laterality: Left;   TRANSURETHRAL RESECTION OF BLADDER TUMOR N/A 01/07/2019   Procedure: CYSTOSCOPY/ EVEACUATION CLOT;  Surgeon: Bjorn Loser, MD;  Location: [facility];  Service: Urology;  Laterality: N/A;   TRANSURETHRAL RESECTION OF PROSTATE N/A 07/26/2020   Procedure: TRANSURETHRAL RESECTION OF THE PROSTATE (TURP);  Surgeon: Alexis Frock, MD;  Location: [facility];  Service: Urology;  Laterality: N/A;  17 MINS    Prior to Admission medications   Medication Sig Start Date End Date Taking? Authorizing Provider  acetaminophen (TYLENOL) 500 MG tablet Take 1,000 mg by mouth every 6 (six) hours as needed for mild pain.   Yes [provider]  amitriptyline (ELAVIL) 100 MG tablet Take 1 tablet (100 mg total) by mouth at bedtime. 05/25/19  Yes Christian, Rylee, MD  atorvastatin (LIPITOR) 80 MG tablet Take 80 mg by mouth every evening. 03/08/20  Yes [provider]  diphenoxylate-atropine (LOMOTIL) 2.5-0.025 MG tablet Take 1 tablet by mouth every 6 (six) hours as needed for diarrhea or loose stools. 04/23/21  Yes [provider]  ELIQUIS 2.5 MG TABS tablet Take 2.5 mg by mouth 2 (two) times daily. 04/22/21  Yes [provider]  furosemide (LASIX) 80 MG tablet Take 1 tablet (80 mg total) by mouth 4 (four) times a week. Mondays, wednesdays, fridays, and sundays Patient taking differently: Take 80 mg by mouth See admin instructions. 80mg  daily on Monday's, Wednesday's, Friday's, and Sunday's 05/26/19  Yes Christian, Rylee, MD  guaiFENesin (MUCINEX) 600 MG 12 hr tablet Take 600 mg by mouth every 12 (twelve) hours as needed for cough.   Yes [provider]  insulin glargine-yfgn (SEMGLEE) 100 UNIT/ML injection Inject  0.1 mLs (10 Units total) into the skin at bedtime. 06/15/21  Yes Arrien, Jimmy Picket, MD  ipratropium-albuterol (DUONEB) 0.5-2.5 (3) MG/3ML SOLN Take 3 mLs by nebulization every 6 (six) hours as needed (shortness of breath).   Yes [provider]  levothyroxine (SYNTHROID) 100 MCG tablet Take 100 mcg by mouth daily. 05/21/21  Yes [provider]  lidocaine-prilocaine (EMLA) cream Apply 1 application topically See admin instructions. Apply small amount to access site 1-2 hours before dialysis, cover with occlusive dressing (saran wrap) Patient taking differently: Apply 1 application topically every Monday, Wednesday, and Friday. Apply small amount to access site 1-2 hours before dialysis, cover with occlusive dressing (saran wrap) 05/25/19  Yes Mitzi Hansen, MD  loperamide (IMODIUM) 2  MG capsule Take 1 capsule (2 mg total) by mouth every 6 (six) hours as needed for diarrhea or loose stools. 06/14/21  Yes Arrien, Jimmy Picket, MD  metoprolol tartrate (LOPRESSOR) 25 MG tablet Take 25 mg by mouth 2 (two) times daily. 02/06/20  Yes [provider]  Multiple Vitamin (MULTIVITAMIN) tablet Take 1 tablet by mouth daily.   Yes [provider]  sertraline (ZOLOFT) 100 MG tablet Take 100 mg by mouth daily. 02/06/20  Yes [provider]  XTANDI 40 MG capsule Take 160 mg by mouth at bedtime. 03/12/20  Yes [provider]  ceFAZolin (ANCEF) IVPB Inject 2 g into the vein every Monday, Wednesday, and Friday at 6 PM. Indication: MSSA bacteremia. Give cefazolin IV dose after each hemodialysis session MWF. First Dose: No Last Day of Therapy:  07/17/2021 Labs - Once weekly:  CBC/D and BMP, Labs - Every other week:  ESR and CRP Method of administration: IV Push Patient not taking: Reported on 06/15/2021 06/17/21   Arrien, Jimmy Picket, MD  LEUPROLIDE ACETATE, 6 MONTH, 45 MG injection Inject 45 mg into the skin every 6 (six) months. Patient not taking: Reported  on 06/15/2021 05/25/19   Mitzi Hansen, MD  multivitamin (RENA-VIT) TABS tablet Take 1 tablet by mouth at bedtime. Patient not taking: Reported on 06/15/2021 06/14/21 07/14/21  Arrien, Jimmy Picket, MD    Scheduled Meds:  sodium chloride   Intravenous Once   amitriptyline  100 mg Oral QHS   [START ON 06/27/2021] apixaban  2.5 mg Oral BID   atorvastatin  80 mg Oral QPM   chlorhexidine  15 mL Mouth Rinse BID   Chlorhexidine Gluconate Cloth  6 each Topical Q0600   darbepoetin (ARANESP) injection - DIALYSIS  200 mcg Intravenous Q Wed-HD   insulin aspart  0-6 Units Subcutaneous TID WC   insulin glargine-yfgn  10 Units Subcutaneous QHS   levothyroxine  100 mcg Oral QAC breakfast   lidocaine-prilocaine  1 application Topical Q M,W,F   mouth rinse  15 mL Mouth Rinse q12n4p   metoprolol tartrate  12.5 mg Oral BID   midodrine  10 mg Oral TID WC   multivitamin with minerals  1 tablet Oral Daily   pantoprazole (PROTONIX) IV  40 mg Intravenous Q12H   sertraline  100 mg Oral Daily   Infusions:   ceFAZolin (ANCEF) IV 2 g (06/24/21 1308)   PRN Meds: acetaminophen **OR** acetaminophen, albuterol, diphenhydrAMINE, diphenoxylate-atropine, guaiFENesin, ondansetron (ZOFRAN) IV   Allergies as of 06/15/2021 - Review Complete 06/15/2021  Allergen Reaction Noted   Gabapentin Swelling 02/21/2011    Family History  Problem Relation Age of Onset   Cancer Mother     Social History   Socioeconomic History   Marital status: Single    Spouse name: Not on file   Number of children: Not on file   Years of education: Not on file   Highest education level: Not on file  Occupational History   Occupation: retired  Tobacco Use   Smoking status: Former    Types: Cigarettes    Quit date: 1997    Years since quitting: 26.0   Smokeless tobacco: Never  Vaping Use   Vaping Use: Never used  Substance and Sexual Activity   Alcohol use: Never   Drug use: Never   Sexual activity: Not on file  Other  Topics Concern   Not on file  Social History Narrative   Not on file   Social Determinants of Health  Financial Resource Strain: Not on file  Food Insecurity: Not on file  Transportation Needs: Not on file  Physical Activity: Not on file  Stress: Not on file  Social Connections: Not on file  Intimate Partner Violence: Not on file    REVIEW OF SYSTEMS: Constitutional: Positive for weakness.  No profound fatigue. ENT:  No nose bleeds Pulm: Still short of breath but it is improved CV:  No palpitations, no LE edema.  No angina. GU:  No hematuria, no frequency GI: See HPI. Heme: Denies excessive bleeding or bruising. Transfusions: Per HPI. Neuro:  No seizures, syncope, headaches. Derm:  No itching, no rash or sores.  Endocrine:  No sweats or chills.  No polyuria or dysuria Immunization: Not queried did review extensive vaccination records from [place].   PHYSICAL EXAM: Vital signs in last 24 hours: Vitals:   06/25/21 0754 06/25/21 1215  BP: 121/62 108/63  Pulse: 71 66  Resp: (!) 22 19  Temp: (!) 97.4 F (36.3 C) 97.6 F (36.4 C)  SpO2: 95% 96%   Wt Readings from Last 3 Encounters:  06/25/21 121.2 kg  06/14/21 117.6 kg  07/26/20 125.3 kg    General: Morbidly obese, chronically ill-appearing, debilitated Head: No signs of head trauma.  No facial asymmetry. Eyes: Conjunctiva pale.  No scleral icterus. Ears: No obvious hearing deficit. Nose: No congestion or discharge Mouth: Moist, pink, clear oral mucosa. Neck: No JVD, no masses, no thyromegaly Lungs: Clear lungs in front.  No increased work of breathing at rest.  Voice is very soft. Heart: Irregularly irregular.  Heart rate 70. Abdomen: Soft, large, nontender.  Active bowel sounds.  Slight scrotal edema..   Rectal: Dark brown stool is 2+ FOBT positive.  No palpable masses.  Was not able to fully visualize the rectal area. Musc/Skeltl: No joint redness, swelling or gross deformities. Extremities: Right greater  than left sided lower extremity edema with pitting. Neurologic: Alert.  Appropriate.  Able to move all 4 limbs and strength was not tested. Skin: No rashes or suspicious lesions. Nodes: No cervical adenopathy. Psych: Calm, cooperative.  Intake/Output from previous day: 01/23 0701 - 01/24 0700 In: 340 [Blood:340] Out: 82  Intake/Output this shift: Total I/O In: 10 [P.O.:10] Out: -   LAB RESULTS: Recent Labs    06/25/21 0609 06/25/21 0927 06/25/21 1208 06/25/21 1409  WBC 6.4 6.0  --  5.8  HGB 7.5* 7.3*  --  7.1*  HCT 24.6* 24.0*  --  24.4*  PLT 128* 126* 130* 134*   BMET Lab Results  Component Value Date   NA 131 (L) 06/25/2021   NA 127 (L) 06/24/2021   NA 133 (L) 06/23/2021   K 4.1 06/25/2021   K 3.8 06/24/2021   K 3.8 06/23/2021   CL 94 (L) 06/25/2021   CL 90 (L) 06/24/2021   CL 94 (L) 06/23/2021   CO2 29 06/25/2021   CO2 25 06/24/2021   CO2 29 06/23/2021   GLUCOSE 137 (H) 06/25/2021   GLUCOSE 176 (H) 06/24/2021   GLUCOSE 205 (H) 06/23/2021   BUN 29 (H) 06/25/2021   BUN 56 (H) 06/24/2021   BUN 37 (H) 06/23/2021   CREATININE 4.18 (H) 06/25/2021   CREATININE 6.14 (H) 06/24/2021   CREATININE 4.86 (H) 06/23/2021   CALCIUM 8.1 (L) 06/25/2021   CALCIUM 8.0 (L) 06/24/2021   CALCIUM 8.1 (L) 06/23/2021   LFT Recent Labs    06/23/21 0057 06/24/21 0056 06/25/21 0159  ALBUMIN 2.0* 2.1* 2.1*   PT/INR  Lab Results  Component Value Date   INR 1.7 (H) 06/25/2021   INR 1.2 05/19/2019   INR 1.2 05/18/2019   Hepatitis Panel No results for input(s): HEPBSAG, HCVAB, HEPAIGM, HEPBIGM in the last 72 hours. C-Diff No components found for: CDIFF Lipase  No results found for: LIPASE  Drugs of Abuse  No results found for: LABOPIA, COCAINSCRNUR, LABBENZ, AMPHETMU, THCU, LABBARB   RADIOLOGY STUDIES: CT ABDOMEN PELVIS WO CONTRAST  Result Date: 06/25/2021 CLINICAL DATA:  Coronavirus pneumonia. Anemia. Anemia is worsening. EXAM: CT ABDOMEN AND PELVIS WITHOUT CONTRAST  TECHNIQUE: Multidetector CT imaging of the abdomen and pelvis was performed following the standard protocol without IV contrast. RADIATION DOSE REDUCTION: This exam was performed according to the departmental dose-optimization program which includes automated exposure control, adjustment of the mA and/or kV according to patient size and/or use of iterative reconstruction technique. COMPARISON:  Chest CT 06/16/2021.  CT pelvis 05/19/2019 FINDINGS: Lower chest: Moderate bilateral effusions layering dependently with dependent atelectasis. Non dependent lung appears largely clear. The heart is enlarged. There is a small amount pericardial fluid. Hepatobiliary: Liver parenchyma appears normal without contrast. No calcified gallstones. Pancreas: Normal Spleen: Normal Adrenals/Urinary Tract: Adrenal glands are normal. The kidneys are atrophic. Simple cyst on both sides. No hydronephrosis. No visible bladder abnormality. Stomach/Bowel: Stomach and small intestine are normal. No visible colon pathology. Vascular/Lymphatic: Aortic atherosclerosis. No aneurysm. IVC is normal. No adenopathy. No retroperitoneal hematoma. Reproductive: Normal Other: No free fluid or air.  No evidence of body wall hemorrhage. Musculoskeletal: Diffusely abnormal appearance of the bones, with a mixed lytic and sclerotic pattern most often associated with widespread malignancy in this patient with a history of metastatic prostate cancer. No evidence of dominant bone lesion. No definite evidence of progression when compared to a CT scan of May 19, 2019. IMPRESSION: No hemorrhage or hematoma seen to explain worsening anemia. Bilateral pleural effusions layering dependently with dependent pulmonary atelectasis. Cardiomegaly. Aortic Atherosclerosis (ICD10-I70.0). Bilateral renal atrophy. 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 No visible progression since a study of 2020.  Electronically Signed   By: Nelson Chimes M.D.   On: 06/25/2021 08:54     IMPRESSION:     Acute on chronic, recurrent anemia requiring PRBCs.  09/2017 [facility] and [facility], w mild gastritis.  VCE fall 2019: small AVM.  Stool is FOBT positive.  Recent treatments include parenteral iron infusions, Mirscera q 2 weeks.      ESRD on HD  Acute on chronic respiratory failure setting of worsening pneumonia.  Recent admission with COVID-19 pneumonia, MSSA bacteremia.  Prostate cancer with widespread bone mets.    Elevated troponins and BNP.  Afib.  Chronic Eliquis, currently on hold, last dose 11/23 in PM.  Per Dr. Percival Spanish "enzymes are elevated but in keeping with acute respiratory distress and rapid atrial fibrillation.  Given the significant comorbidities I would not suggest that invasive evaluation will be warranted at this point.  I will not plan a repeat echocardiogram.  We will manage this medically".  Coagulopathy with INR 1.7.  Hyponatremia.    PLAN:       Per Dr Modena Nunnery.  Clinically pt at high risk for sedation/endoscopic procedures.     Azucena Freed  06/25/2021, 3:33 PM Phone [phone]

## 2021-06-25 NOTE — Progress Notes (Addendum)
PROGRESS NOTE                                                                                                                                                                                                             Patient Demographics:    Richard Hall, is a 66 y.o. male, DOB - 19-Sep-1955, BMW:[id number]  Outpatient Primary MD for the patient is [name], MD    LOS - 10  Admit date - 06/15/2021    Chief Complaint  Patient presents with   Shortness of Breath       Brief Narrative (HPI from H&P)    Richard Hall is a 66 y.o. male with medical history significant of hypertension, atrial fibrillation, congestive heart failure, CVA, ESRD on HD, and prostate cancer presents with complaints of shortness of breath.  Patient had just recently been hospitalized from 12/30-1/13 after initially presenting with septic shock related to MSSA bacteremia and COVID-19 viral pneumonia.  During his hospital stay patient required intubation.  ID had been consulted and patient was recommended to complete 6 weeks of cefazolin to be completed on 07/17/2021.  He went to the rehab facility but soon was sent back to the hospital for hypoxia, in the ER his work-up was suggestive of possible continued pneumonia, troponin, cardiology was consulted and he was admitted for further care.   Subjective:   Patient in bed, appears comfortable, denies any headache, no fever, no chest pain or pressure, no shortness of breath , no abdominal pain, no nausea, no blood in stool, stool baseline dark brown.   Assessment  & Plan :   Acute Hypoxic Resp. Failure due to Acute on chronic diastolic CHF in the presence of severe MR, EF 60% on recent echo with NSTEMI in a patient with ESRD and recent COVID-19 pneumonia with a new cavitary lesion on CT scan ?  Chronic intermittent microaspiration - no sepsis this admission, however does have leukocytosis with a  cavitary lesion on CT scan raising the suspicion for possible underlying aspiration, SLP following, also continue fluid removal through HD, renal and cardiology on board will also request pulmonary to opine on his cavitary lung lesion. CRP and procalcitonin also point towards a bacterial infectious etiology as well.  He was briefly placed on Unasyn with excellent clinical improvement, cultures here this admission negative thus  far.  Have been consulted ID discussed the plan with pulmonary on 06/20/2021 plan is to continue cefazolin till 07/17/2021 thereafter outpatient ID and pulmonary follow-up.    Encouraged to sit up in chair in the daytime use I-S and flutter valve for pulmonary toiletry.  Will advance activity and titrate down oxygen as possible.  He is on cefazolin for recent MSSA bacteremia with stop date of 07/17/2021 (pharm confirmed)    2.  ESRD.  Renal on board for fluid removal through HD.  3.  Recent MSSA bacteremia.  Cefazolin with a stop date of 07/17/2021 seen by ID this admission.  4.  Dysphagia.  Speech therapy following currently on dysphagia 2 diet.  Kindly see #1 above.  5.  AOCD.  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    He has received 2 units of packed RBC on 06/24/2021 but H&H has not come up as much as I expect, no sign of ongoing bleeding, CT abdomen pelvis rules out retroperitoneal bleed.  Will check LDH, haptoglobin and DIC panel.  Continue to monitor CBC closely, transfuse as needed.  Also on 06/24/2021 his dialysis was euvolemic i.e. fluid was not removed hence this could be delusional as well.  Have discussed with nephrology for dialysis early morning 06/26/2021 with fluid removal as much as his blood pressure  permits.  Will monitor closely.   6.  Obesity with OSA.  BMI 35 follow with PCP for weight loss nightly CPAP.  Counseled on compliance  7.  Recent left ankle fracture.  Nonweightbearing left leg per last admission, outpatient orthopedics follow-up.  PT OT.  8.  Hypothyroidism.  On Synthroid   9.  Chronic A. fib [place] vas 2 score of greater than 4.  On Eliquis continue low-dose beta-blocker as tolerated by blood pressure.    10.  Hypotension.  Blood pressure has stabilized and improving titrate off midodrine.  11.  Dyslipidemia.  On Synthroid continue  12. History of prostate cancer - Continue Xtandi.  13.  Elevated troponin.  Likely demand ischemia in the setting of ESRD, demand mismatch caused by hypoxia from #1 above, chest pain-free.  Cardiology on board, on Eliquis, statin, low-dose beta-blocker as tolerated by blood pressure and monitor  14.  Severe MR.  Outpatient cardiology follow-up.  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  15.  History of COVID-19 infection in the past, still COVID-19 positive.  Repeat test on 06/25/2021 is still positive but cycle threshold is 39  i.e. he is not contagious and this is not an active infection.    16. DM type II.  On Lantus and sliding scale.  Monitor and adjust  Lab Results  Component Value Date   HGBA1C 6.1 (H) 06/01/2021    CBG (last 3)  Recent Labs    06/24/21 1609 06/24/21 2052 06/25/21 0753  GLUCAP 134* 137* 158*           Condition - Extremely Guarded  Family Communication  : [name] [phone] on 06/17/21, 06/18/21, 06/19/21, 06/22/21 message left at 10:57 AM, 06/24/21 at 9:42 AM message left. Updated 06/25/21 - DNR  Code Status : DNR  Consults  : Cardiology, Pulmonary  PUD Prophylaxis :    Procedures  :     CT  Scan abdomen pelvis repeat on 06/25/2021 without contrast.  No evidence of retroperitoneal bleed.  CT scan - 1. The appearance of the chest suggest congestive heart failure, as above. 2. In addition, there  is a new thick-walled cavitary nodule measuring 1.4 x 1.2 cm in the right upper lobe. This is potentially infectious or inflammatory in etiology, however, underlying neoplasm is not excluded. Short-term follow-up repeat noncontrast chest CT is recommended in 2-3 months to ensure the stability or regression of this lesion. 3. Mildly enlarged right paratracheal lymph node, nonspecific in the setting of presumed congestive heart failure, but concerning in light of the right upper lobe pulmonary nodule. Attention at time of repeat CT examination is recommended to ensure stability/regression. 4. Diffuse sclerotic lesions noted throughout the visualized axial and appendicular skeleton, presumably reflective of widespread osseous metastasis in this patient with history of prostate cancer. 5. Aortic atherosclerosis, in addition to left main and three-vessel coronary artery disease. Please note that although the presence of coronary artery calcium documents the presence of coronary artery disease, the severity of this disease and any potential stenosis cannot be assessed on this non-gated CT examination. Assessment for potential risk factor modification, dietary therapy or pharmacologic therapy may be warranted, if clinically indicated. 6. There are calcifications of the aortic valve. Echocardiographic correlation for evaluation of potential valvular dysfunction may be warranted if clinically indicated. Aortic Atherosclerosis (ICD10-I70.0).      Disposition Plan  :    Status is: Inpatient  Remains inpatient appropriate because: Acute hypoxic respiratory failure  DVT Prophylaxis  :    apixaban (ELIQUIS) tablet 2.5 mg Start: 06/27/21 1000 apixaban (ELIQUIS) tablet 2.5 mg     Lab Results  Component Value Date   PLT 126 (L) 06/25/2021    Diet :  Diet Order             Diet renal with fluid restriction Fluid restriction: 1200 mL Fluid; Room service appropriate? Yes; Fluid consistency: Thin  Diet effective  now                    Inpatient Medications  Scheduled Meds:  amitriptyline  100 mg Oral QHS   [START ON 06/27/2021] apixaban  2.5 mg Oral BID   atorvastatin  80 mg Oral QPM   chlorhexidine  15 mL Mouth Rinse BID   Chlorhexidine Gluconate Cloth  6 each Topical Q0600   darbepoetin (ARANESP) injection - DIALYSIS  200 mcg Intravenous Q Wed-HD   insulin aspart  0-6 Units Subcutaneous TID WC   insulin glargine-yfgn  10 Units Subcutaneous QHS   levothyroxine  100 mcg Oral QAC breakfast   lidocaine-prilocaine  1 application Topical Q M,W,F   mouth rinse  15 mL Mouth Rinse q12n4p   metoprolol tartrate  12.5 mg Oral BID   midodrine  10 mg Oral TID WC   multivitamin with minerals  1 tablet Oral Daily   pantoprazole (PROTONIX) IV  40 mg Intravenous Q12H   sertraline  100 mg Oral Daily   Continuous Infusions:   ceFAZolin (ANCEF) IV 2 g (06/24/21 1308)   PRN Meds:.acetaminophen **OR** acetaminophen, albuterol, diphenhydrAMINE, diphenoxylate-atropine, guaiFENesin, ondansetron (ZOFRAN) IV     Time Spent in minutes  30   Lala Lund M.D on 06/25/2021 at 11:28 AM  To page go to [email or web address]   [facility] -  Office  [phone]  See all Orders from today for further details    Objective:   Vitals:   06/24/21 2036 06/24/21 2037  06/25/21 0500 06/25/21 0754  BP: 117/61 117/61  121/62  Pulse: 71 76  71  Resp: 14 19  (!) 22  Temp: 97.7 F (36.5 C) 97.7 F (36.5 C)  (!) 97.4 F (36.3 C)  TempSrc: Axillary Oral  Axillary  SpO2:  94%  95%  Weight:   121.2 kg   Height:        Wt Readings from Last 3 Encounters:  06/25/21 121.2 kg  06/14/21 117.6 kg  07/26/20 125.3 kg     Intake/Output Summary (Last 24 hours) at 06/25/2021 1128 Last data filed at 06/25/2021 1116 Gross per 24 hour  Intake 10 ml  Output 82 ml  Net -72 ml      Physical Exam  Awake Alert, No new F.N deficits, Normal affect [place].AT,PERRAL Supple Neck, No JVD,   Symmetrical Chest wall  movement, Good air movement bilaterally, CTAB RRR,No Gallops, Rubs or new Murmurs,  +ve B.Sounds, Abd Soft, No tenderness,   No Cyanosis, Clubbing or edema    RN pressure injury documentation: Pressure Injury 06/11/21 Coccyx Medial Stage 2 -  Partial thickness loss of dermis presenting as a shallow open injury with a red, pink wound bed without slough. (Active)  06/11/21 2159  Location: Coccyx  Location Orientation: Medial  Staging: Stage 2 -  Partial thickness loss of dermis presenting as a shallow open injury with a red, pink wound bed without slough.  Wound Description (Comments):   Present on Admission:      Data Review:   Recent Labs  Lab 06/20/21 0256 06/21/21 0147 06/22/21 0044 06/23/21 0057 06/24/21 0056 06/25/21 0609 06/25/21 0927  WBC 5.8 7.0 5.8 7.6 7.9 6.4 6.0  HGB 7.3* 7.8* 7.3* 7.1* 6.6* 7.5* 7.3*  HCT 24.9* 27.0* 25.3* 24.3* 22.4* 24.6* 24.0*  PLT 179 184 177 161 150 128* 126*  MCV 95.4 96.8 97.7 98.0 97.8 96.5 96.0  MCH 28.0 28.0 28.2 28.6 28.8 29.4 29.2  MCHC 29.3* 28.9* 28.9* 29.2* 29.5* 30.5 30.4  RDW 17.8* 18.5* 19.2* 20.1* 21.0* 21.8* 21.2*  LYMPHSABS 0.6* 0.6* 0.7 0.7 0.7  --   --   MONOABS 0.5 0.5 0.5 0.7 0.9  --   --   EOSABS 0.1 0.1 0.1 0.1 0.1  --   --   BASOSABS 0.0 0.1 0.0 0.0 0.0  --   --     Recent Labs  Lab 06/19/21 0135 06/20/21 0256 06/21/21 0120 06/21/21 0146 06/21/21 0147 06/22/21 0044 06/23/21 0057 06/24/21 0056 06/25/21 0159  NA 130* 134* 132*  --   --  131* 133* 127* 131*  K 3.9 4.0 4.4  --   --  3.8 3.8 3.8 4.1  CL 93* 95* 95*  --   --  95* 94* 90* 94*  CO2 27 29 24   --   --  28 29 25 29   GLUCOSE 163* 134* 138*  --   --  91 205* 176* 137*  BUN 49* 24* 31*  --   --  21 37* 56* 29*  CREATININE 6.00* 4.04* 5.21*  --   --  3.66* 4.86* 6.14* 4.18*  CALCIUM 7.7* 7.8* 8.3*  --   --  8.0* 8.1* 8.0* 8.1*  AST 27 24 29   --   --   --   --   --   --   ALT 5 5 5   --   --   --   --   --   --   ALKPHOS 89 109 120  --   --   --    --   --   --  BILITOT 0.3 0.5 0.7  --   --   --   --   --   --   ALBUMIN 2.0*   2.0* 2.0*   2.0* 2.2*   2.2*  --   --  2.0* 2.0* 2.1* 2.1*  MG 2.4  --   --   --   --   --   --   --   --   CRP 8.2* 6.2*  --   --  6.2* 5.1* 5.9*  --   --   BNP 1,938.6* 2,017.9*  --  1,612.0*  --  1,418.4* 1,284.6*  --   --      Radiology Reports CT ABDOMEN PELVIS WO CONTRAST  Result Date: 06/25/2021 CLINICAL DATA:  Coronavirus pneumonia. Anemia. Anemia is worsening. EXAM: CT ABDOMEN AND PELVIS WITHOUT CONTRAST TECHNIQUE: Multidetector CT imaging of the abdomen and pelvis was performed following the standard protocol without IV contrast. RADIATION DOSE REDUCTION: This exam was performed according to the departmental dose-optimization program which includes automated exposure control, adjustment of the mA and/or kV according to patient size and/or use of iterative reconstruction technique. COMPARISON:  Chest CT 06/16/2021.  CT pelvis 05/19/2019 FINDINGS: Lower chest: Moderate bilateral effusions layering dependently with dependent atelectasis. Non dependent lung appears largely clear. The heart is enlarged. There is a small amount pericardial fluid. Hepatobiliary: Liver parenchyma appears normal without contrast. No calcified gallstones. Pancreas: Normal Spleen: Normal Adrenals/Urinary Tract: Adrenal glands are normal. The kidneys are atrophic. Simple cyst on both sides. No hydronephrosis. No visible bladder abnormality. Stomach/Bowel: Stomach and small intestine are normal. No visible colon pathology. Vascular/Lymphatic: Aortic atherosclerosis. No aneurysm. IVC is normal. No adenopathy. No retroperitoneal hematoma. Reproductive: Normal Other: No free fluid or air.  No evidence of body wall hemorrhage. Musculoskeletal: Diffusely abnormal appearance of the bones, with a mixed lytic and sclerotic pattern most often associated with widespread malignancy in this patient with a history of metastatic prostate cancer. No evidence  of dominant bone lesion. No definite evidence of progression when compared to a CT scan of May 19, 2019. IMPRESSION: No hemorrhage or hematoma seen to explain worsening anemia. Bilateral pleural effusions layering dependently with dependent pulmonary atelectasis. Cardiomegaly. Aortic Atherosclerosis (ICD10-I70.0). Bilateral renal atrophy. 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 No visible progression since a study of 2020. Electronically Signed   By: Nelson Chimes M.D.   On: 06/25/2021 08:54

## 2021-06-25 NOTE — Progress Notes (Signed)
Pt states he will not be wearing CPAP tonight.  Patient made aware to have RN call RT if he changes his mind.

## 2021-06-25 NOTE — Plan of Care (Signed)

## 2021-06-25 NOTE — Progress Notes (Signed)
Physical Therapy Treatment Patient Details Name: Richard Hall MRN: [id number] DOB: 11/17/1955 Today's Date: 06/25/2021   History of Present Illness 66 y.o. male with medical history significant of hypertension, atrial fibrillation, congestive heart failure, CVA, ESRD on HD, and prostate cancer. Recently d/c from hospital to SNF and readmitted with acute respiratory failure due to overload. On prior admission, found to have chronic non-healed Lt femur facture despite ORIF, will request NWB status LLE.    PT Comments    Patient seen for bed level exercises and transfer training. Pt reports he has not been OOB since last seen by PT (later demonstrated poor memory, so ?accuracy). Required incr time and cues for problem-solving how to laterally scoot along EOB with pt ultimately able to complete 5 scoots with min assist with bed pad and bed in slight trendelenburg. Patient refused transfer to chair and unable to persuade him to attempt. Returned to supine and placed in chair position.     Recommendations for follow up therapy are one component of a multi-disciplinary discharge planning process, led by the attending physician.  Recommendations may be updated based on patient status, additional functional criteria and insurance authorization.  Follow Up Recommendations  Skilled nursing-short term rehab (<3 hours/day)     Assistance Recommended at Discharge Frequent or constant Supervision/Assistance  Patient can return home with the following A lot of help with walking and/or transfers;Two people to help with walking and/or transfers;A lot of help with bathing/dressing/bathroom;Two people to help with bathing/dressing/bathroom;Assistance with cooking/housework;Assist for transportation;Help with stairs or ramp for entrance   Equipment Recommendations  None recommended by PT;Other (comment) (TBD if able to get home)    Recommendations for Other Services       Precautions / Restrictions  Precautions Precautions: Fall Restrictions LLE Weight Bearing: Non weight bearing     Mobility  Bed Mobility Overal bed mobility: Needs Assistance Bed Mobility: Supine to Sit Rolling: Min assist   Supine to sit: Mod assist Sit to supine: Min assist, +2 for physical assistance Sit to sidelying: Mod assist General bed mobility comments: rolling to replace pad under hips prior to up to eOB on his right    Transfers Overall transfer level: Needs assistance Equipment used: None Transfers: Bed to chair/wheelchair/BSC            Lateral/Scoot Transfers: +2 safety/equipment, From elevated surface, Min assist General transfer comment: initiated lateral scoots along EOB with pt initially unable to scoot his hips; tipped bed into slight trendelenburg and pt then able to laterally scoot x 5 reps to get up to bedrail/HOB; pt refused transfer to recliner    Ambulation/Gait               General Gait Details: Unable   Stairs             Wheelchair Mobility    Modified Rankin (Stroke Patients Only)       Balance Overall balance assessment: Needs assistance Sitting-balance support: Feet supported, No upper extremity supported Sitting balance-Leahy Scale: Fair Sitting balance - Comments: sat EOB ~10 minutes                                    Cognition Arousal/Alertness: Awake/alert Behavior During Therapy: Flat affect Overall Cognitive Status: No family/caregiver present to determine baseline cognitive functioning  Memory: Decreased recall of precautions, Decreased short-term memory Following Commands: Follows one step commands with increased time     Problem Solving: Slow processing, Difficulty sequencing, Requires verbal cues, Requires tactile cues General Comments: slow to respond to cues for exercises and for mobiltiy; could not state NWB LLE precaution        Exercises General Exercises - Upper  Extremity Shoulder Flexion: AROM, Strengthening, Both, 5 reps, Seated General Exercises - Lower Extremity Long Arc Quad: AROM, Strengthening, Seated, Right, 5 reps (reported incr left thigh pain when RLE lifted off the floor) Heel Slides: Supine, AROM, Right, 10 reps, Left, 5 reps Toe Raises: AROM, Right, 10 reps Heel Raises: AROM, Right, 10 reps    General Comments        Pertinent Vitals/Pain Pain Assessment Pain Assessment: Faces Faces Pain Scale: Hurts little more Pain Location: lateral L thigh Pain Descriptors / Indicators: Discomfort, Grimacing Pain Intervention(s): Limited activity within patient's tolerance, Monitored during session, Repositioned    Home Living                          Prior Function            PT Goals (current goals can now be found in the care plan section) Acute Rehab PT Goals Patient Stated Goal: Get well Time For Goal Achievement: 06/30/21 Potential to Achieve Goals: Fair Progress towards PT goals: Progressing toward goals    Frequency    Min 2X/week      PT Plan Current plan remains appropriate    Co-evaluation              AM-PAC PT "6 Clicks" Mobility   Outcome Measure  Help needed turning from your back to your side while in a flat bed without using bedrails?: A Little Help needed moving from lying on your back to sitting on the side of a flat bed without using bedrails?: A Lot Help needed moving to and from a bed to a chair (including a wheelchair)?: Total Help needed standing up from a chair using your arms (e.g., wheelchair or bedside chair)?: Total Help needed to walk in hospital room?: Total Help needed climbing 3-5 steps with a railing? : Total 6 Click Score: 9    End of Session Equipment Utilized During Treatment: Oxygen Activity Tolerance: Patient tolerated treatment well Patient left: with call bell/phone within reach;in bed;with bed alarm set (in chair position in bed) Nurse Communication:  Mobility status;Need for lift equipment;Precautions;Weight bearing status PT Visit Diagnosis: Other abnormalities of gait and mobility (R26.89);Muscle weakness (generalized) (M62.81);Other (comment);Pain (L LE NWB status) Pain - Right/Left: Left Pain - part of body: Leg     Time: 8185-6314 PT Time Calculation (min) (ACUTE ONLY): 28 min  Charges:  $Therapeutic Exercise: 8-22 mins $Therapeutic Activity: 8-22 mins                      [name], PT Acute Rehabilitation Services  Pager [phone] Office [phone]    Richard Hall 06/25/2021, 2:26 PM

## 2021-06-26 LAB — TYPE AND SCREEN
ABO/RH(D): A POS
Antibody Screen: NEGATIVE
Unit division: 0
Unit division: 0
Unit division: 0

## 2021-06-26 LAB — RENAL FUNCTION PANEL
Albumin: 2 g/dL — ABNORMAL LOW (ref 3.5–5.0)
Anion gap: 11 (ref 5–15)
BUN: 40 mg/dL — ABNORMAL HIGH (ref 8–23)
CO2: 25 mmol/L (ref 22–32)
Calcium: 8.1 mg/dL — ABNORMAL LOW (ref 8.9–10.3)
Chloride: 89 mmol/L — ABNORMAL LOW (ref 98–111)
Creatinine, Ser: 5.4 mg/dL — ABNORMAL HIGH (ref 0.61–1.24)
GFR, Estimated: 11 mL/min — ABNORMAL LOW (ref >60)
Glucose, Bld: 167 mg/dL — ABNORMAL HIGH (ref 70–99)
Phosphorus: 5.6 mg/dL — ABNORMAL HIGH (ref 2.5–4.6)
Potassium: 4.6 mmol/L (ref 3.5–5.1)
Sodium: 125 mmol/L — ABNORMAL LOW (ref 135–145)

## 2021-06-26 LAB — BPAM RBC
Blood Product Expiration Date: 202302202359
Blood Product Expiration Date: 202302202359
Blood Product Expiration Date: 202302222359
ISSUE DATE / TIME: 202301230956
ISSUE DATE / TIME: 202301230956
ISSUE DATE / TIME: 202301241814
Unit Type and Rh: 6200
Unit Type and Rh: 6200
Unit Type and Rh: 6200

## 2021-06-26 LAB — CBC
HCT: 27.2 % — ABNORMAL LOW (ref 39.0–52.0)
Hemoglobin: 8.1 g/dL — ABNORMAL LOW (ref 13.0–17.0)
MCH: 28.6 pg (ref 26.0–34.0)
MCHC: 29.8 g/dL — ABNORMAL LOW (ref 30.0–36.0)
MCV: 96.1 fL (ref 80.0–100.0)
Platelets: 131 10*3/uL — ABNORMAL LOW (ref 150–400)
RBC: 2.83 MIL/uL — ABNORMAL LOW (ref 4.22–5.81)
RDW: 20.6 % — ABNORMAL HIGH (ref 11.5–15.5)
WBC: 7.9 10*3/uL (ref 4.0–10.5)
nRBC: 0.3 % — ABNORMAL HIGH (ref 0.0–0.2)

## 2021-06-26 LAB — GLUCOSE, CAPILLARY
Glucose-Capillary: 105 mg/dL — ABNORMAL HIGH (ref 70–99)
Glucose-Capillary: 165 mg/dL — ABNORMAL HIGH (ref 70–99)
Glucose-Capillary: 208 mg/dL — ABNORMAL HIGH (ref 70–99)

## 2021-06-26 LAB — HAPTOGLOBIN: Haptoglobin: 160 mg/dL (ref 32–363)

## 2021-06-26 MED ORDER — HEPARIN SODIUM (PORCINE) 1000 UNIT/ML DIALYSIS: 1000.0000 [IU] | INTRAMUSCULAR | Status: DC | PRN

## 2021-06-26 MED ORDER — PENTAFLUOROPROP-TETRAFLUOROETH EX AERO: 1.0000 "application " | INHALATION_SPRAY | CUTANEOUS | Status: DC | PRN

## 2021-06-26 MED ORDER — ALTEPLASE 2 MG IJ SOLR: 2.0000 mg | Freq: Once | INTRAMUSCULAR | Status: DC | PRN

## 2021-06-26 MED ORDER — SODIUM CHLORIDE 0.9 % IV SOLN: 100.0000 mL | INTRAVENOUS | Status: DC | PRN

## 2021-06-26 MED ORDER — CALCIUM CARBONATE ANTACID 500 MG PO CHEW
1.0000 | CHEWABLE_TABLET | Freq: Two times a day (BID) | ORAL | Status: DC | PRN
  Administered 2021-06-26: 19:00:00 200 mg via ORAL
  Filled 2021-06-26: qty 1

## 2021-06-26 MED ORDER — LIDOCAINE HCL (PF) 1 % IJ SOLN: 5.0000 mL | INTRAMUSCULAR | Status: DC | PRN

## 2021-06-26 MED ORDER — LIDOCAINE-PRILOCAINE 2.5-2.5 % EX CREA: 1.0000 "application " | TOPICAL_CREAM | CUTANEOUS | Status: DC | PRN

## 2021-06-26 NOTE — Progress Notes (Signed)
OT Cancellation Note  Patient Details Name: Richard Hall MRN: [id number] DOB: Sep 28, 1955   Cancelled Treatment:    Reason Eval/Treat Not Completed: Patient at procedure or test/ unavailable;Patient declined, no reason specified. Unable to se pt this a.m. due to being at HD, followed up this afternoon and pt refused stating " I'm tired and this won't do not good" Pt also not eating his lunch stating that he did not want it. Encouraged pt to at least sit EOB and reiterated the importance e of activity/therapy, however pt continued to decline.  Emmit Alexanders [facility] 06/26/2021, 2:48 PM

## 2021-06-26 NOTE — Progress Notes (Signed)
[facility] Progress Note   Subjective:    Seen in room.  Had dialysis this am - 3L UF.  No complaints. No cp/dyspnea   Objective Vitals:   06/26/21 1100 06/26/21 1130 06/26/21 1140 06/26/21 1209  BP: 116/83 121/67 124/68 118/74  Pulse: 72 78 76 73  Resp: 16 18 19 18   Temp:   97.9 F (36.6 C) 98.1 F (36.7 C)  TempSrc:    Axillary  SpO2: 96% 100% 100% 96%  Weight:      Height:       Physical Exam General: Sitting up beside the bed; NAD Heart: Normal S1 and S2; No murmurs, gallops, or rubs Lungs: Clear anteriorly and laterally; No wheezing, rales, or rhonchi Abdomen: Large, soft, and non-tender Extremities: 1+ edema noted LLE, no edema bilateral thighs or RLE Dialysis Access:  L AVF (+) Bruit/Thrill   Filed Weights   06/24/21 0751 06/25/21 0500 06/26/21 0500  Weight: 120.1 kg 121.2 kg 122.3 kg    Intake/Output Summary (Last 24 hours) at 06/26/2021 1239 Last data filed at 06/26/2021 1140 Gross per 24 hour  Intake 308 ml  Output 3100 ml  Net -2792 ml     Additional Objective Labs: Basic Metabolic Panel: Recent Labs  Lab 06/24/21 0056 06/25/21 0159 06/26/21 0231  NA 127* 131* 125*  K 3.8 4.1 4.6  CL 90* 94* 89*  CO2 25 29 25   GLUCOSE 176* 137* 167*  BUN 56* 29* 40*  CREATININE 6.14* 4.18* 5.40*  CALCIUM 8.0* 8.1* 8.1*  PHOS 4.7* 4.0 5.6*    Liver Function Tests: Recent Labs  Lab 06/20/21 0256 06/21/21 0120 06/22/21 0044 06/24/21 0056 06/25/21 0159 06/26/21 0231  AST 24 29  --   --   --   --   ALT 5 5  --   --   --   --   ALKPHOS 109 120  --   --   --   --   BILITOT 0.5 0.7  --   --   --   --   PROT 6.2* 6.6  --   --   --   --   ALBUMIN 2.0*   2.0* 2.2*   2.2*   < > 2.1* 2.1* 2.0*   < > = values in this interval not displayed.    No results for input(s): LIPASE, AMYLASE in the last 168 hours. CBC: Recent Labs  Lab 06/22/21 0044 06/23/21 0057 06/24/21 0056 06/25/21 0609 06/25/21 0927 06/25/21 1208 06/25/21 1409  06/25/21 2147 06/26/21 0231  WBC 5.8 7.6 7.9 6.4 6.0  --  5.8 7.7 7.9  NEUTROABS 4.4 5.9 6.1  --   --   --   --   --   --   HGB 7.3* 7.1* 6.6* 7.5* 7.3*  --  7.1* 8.3* 8.1*  HCT 25.3* 24.3* 22.4* 24.6* 24.0*  --  24.4* 26.7* 27.2*  MCV 97.7 98.0 97.8 96.5 96.0  --  96.4 97.1 96.1  PLT 177 161 150 128* 126*   < > 134* 136* 131*   < > = values in this interval not displayed.    Blood Culture    Component Value Date/Time   SDES BLOOD RIGHT ARM 06/15/2021 0919   SPECREQUEST  06/15/2021 0919    BOTTLES DRAWN AEROBIC AND ANAEROBIC Blood Culture adequate volume   CULT  06/15/2021 0919    NO GROWTH 5 DAYS Performed at [facility], [place] [address], [place], [place] [address]  REPTSTATUS 06/20/2021 FINAL 06/15/2021 0919    Cardiac Enzymes: No results for input(s): CKTOTAL, CKMB, CKMBINDEX, TROPONINI in the last 168 hours. CBG: Recent Labs  Lab 06/25/21 0753 06/25/21 1215 06/25/21 1656 06/25/21 2028 06/26/21 1212  GLUCAP 158* 205* 152* 176* 105*    Iron Studies: No results for input(s): IRON, TIBC, TRANSFERRIN, FERRITIN in the last 72 hours. Lab Results  Component Value Date   INR 1.7 (H) 06/25/2021   INR 1.2 05/19/2019   INR 1.2 05/18/2019   Studies/Results: CT ABDOMEN PELVIS WO CONTRAST  Result Date: 06/25/2021 CLINICAL DATA:  Coronavirus pneumonia. Anemia. Anemia is worsening. EXAM: CT ABDOMEN AND PELVIS WITHOUT CONTRAST TECHNIQUE: Multidetector CT imaging of the abdomen and pelvis was performed following the standard protocol without IV contrast. RADIATION DOSE REDUCTION: This exam was performed according to the departmental dose-optimization program which includes automated exposure control, adjustment of the mA and/or kV according to patient size and/or use of iterative reconstruction technique. COMPARISON:  Chest CT 06/16/2021.  CT pelvis 05/19/2019 FINDINGS: Lower chest: Moderate bilateral effusions layering dependently with dependent atelectasis. Non dependent  lung appears largely clear. The heart is enlarged. There is a small amount pericardial fluid. Hepatobiliary: Liver parenchyma appears normal without contrast. No calcified gallstones. Pancreas: Normal Spleen: Normal Adrenals/Urinary Tract: Adrenal glands are normal. The kidneys are atrophic. Simple cyst on both sides. No hydronephrosis. No visible bladder abnormality. Stomach/Bowel: Stomach and small intestine are normal. No visible colon pathology. Vascular/Lymphatic: Aortic atherosclerosis. No aneurysm. IVC is normal. No adenopathy. No retroperitoneal hematoma. Reproductive: Normal Other: No free fluid or air.  No evidence of body wall hemorrhage. Musculoskeletal: Diffusely abnormal appearance of the bones, with a mixed lytic and sclerotic pattern most often associated with widespread malignancy in this patient with a history of metastatic prostate cancer. No evidence of dominant bone lesion. No definite evidence of progression when compared to a CT scan of May 19, 2019. IMPRESSION: No hemorrhage or hematoma seen to explain worsening anemia. Bilateral pleural effusions layering dependently with dependent pulmonary atelectasis. Cardiomegaly. Aortic Atherosclerosis (ICD10-I70.0). Bilateral renal atrophy. 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 No visible progression since a study of 2020. Electronically Signed   By: Nelson Chimes M.D.   On: 06/25/2021 08:54    Medications:   ceFAZolin (ANCEF) IV 2 g (06/24/21 1308)    amitriptyline  100 mg Oral QHS   [START ON 06/27/2021] apixaban  2.5 mg Oral BID   atorvastatin  80 mg Oral QPM   chlorhexidine  15 mL Mouth Rinse BID   Chlorhexidine Gluconate Cloth  6 each Topical Q0600   darbepoetin (ARANESP) injection - DIALYSIS  200 mcg Intravenous Q Wed-HD   insulin aspart  0-6 Units Subcutaneous TID WC   insulin glargine-yfgn  10 Units Subcutaneous QHS   levothyroxine  100 mcg Oral QAC  breakfast   lidocaine-prilocaine  1 application Topical Q M,W,F   mouth rinse  15 mL Mouth Rinse q12n4p   metoprolol tartrate  12.5 mg Oral BID   midodrine  10 mg Oral TID WC   multivitamin with minerals  1 tablet Oral Daily   pantoprazole (PROTONIX) IV  40 mg Intravenous Q12H   sertraline  100 mg Oral Daily    Dialysis Orders: [facility] MWF  4h  450 bfr  123kg  2/2 bath  LU AVF   Hep none  - mircera 225 mcg every 2 weeks - last received 200 mcg on 12/14  - venofer 100  mg (ordered for 10 doses)  - no vdra   Assessment/Plan: AHRF. Volume overload. Pulm edema on admit. Continue volume removal with HD. Now below OP dry weight. Will have lower EDW at discharge 117kg.  Recent COVID pna - was on vent 1/05 to 1/08. ?aspiration PNA  ESRD - HD MWF.  Continue on schedule. HD 1/25 BP/ volume - Soft blood pressures this admission. Midordine increased 10mg  TID. Volume improving, now below EDW.  Anemia ckd - Hgb 8.1 --s/p 2 u prbcs 1/23. Continue Aranesp 200 q Wed. Iron replete -s/p 1g  IV Fe load here.  MBD ckd - cont auryxia ac.  Atrial Fibrillation w/ RVR - Rate controlled. On Eliquis, metoprolol  MSSA bacteremia. Prior to this admission. On cefazolin stop date 07/17/21.  New cavitary lesion in RUL on CT. Seen by PCCM and felt consistent with septic pulmonary embolus. Plan for 6 weeks Augmentin at discharge  Dispo: D/C to SNF when stable.   Richard Child PA-C [facility] 06/26/2021,12:39 PM

## 2021-06-26 NOTE — Care Management (Signed)
Patient will need to tolerate 4 hours in chair for HD prior to DC.  Encourage time in recliner and HD in chair as much as possible.

## 2021-06-26 NOTE — Progress Notes (Signed)
PROGRESS NOTE                                                                                                                                                                                                             Patient Demographics:    Richard Hall, is a 66 y.o. male, DOB - 11/05/1955, ZOX:[id number]  Outpatient Primary MD for the patient is [name], MD    LOS - 11  Admit date - 06/15/2021    Chief Complaint  Patient presents with   Shortness of Breath       Brief Narrative (HPI from H&P)     Richard Hall is a 66 y.o. male with medical history significant of hypertension, atrial fibrillation, congestive heart failure, CVA, ESRD on HD, and prostate cancer presents with complaints of shortness of breath.  Patient had just recently been hospitalized from 12/30-1/13 after initially presenting with septic shock related to MSSA bacteremia and COVID-19 viral pneumonia.  During his hospital stay patient required intubation.  ID had been consulted and patient was recommended to complete 6 weeks of cefazolin to be completed on 07/17/2021.  He went to the rehab facility but soon was sent back to the hospital for hypoxia, in the ER his work-up was suggestive of possible continued pneumonia, troponin, cardiology was consulted and he was admitted for further care.   Subjective:   No significant events overnight as discussed with staff, patient denies any complaints today.      Assessment  & Plan :   Acute Hypoxic Resp. Failure due to Acute on chronic diastolic CHF in the presence of severe MR, EF 60% on recent echo with NSTEMI in a patient with ESRD and recent COVID-19 pneumonia with a new cavitary lesion on CT scan ?  Chronic intermittent microaspiration  - no sepsis this admission, however does have leukocytosis with a cavitary lesion on CT scan raising the suspicion for possible underlying aspiration, SLP following,  also continue fluid removal through HD, renal and cardiology on board will also request pulmonary to opine on his cavitary lung lesion. CRP and procalcitonin also point towards a bacterial infectious etiology as well.  He was briefly placed on Unasyn with excellent clinical improvement, cultures here this admission negative thus far.  Have been consulted ID discussed the plan with pulmonary on 06/20/2021 plan is  to continue cefazolin till 07/17/2021 thereafter outpatient ID and pulmonary follow-up.   - Encouraged to sit up in chair in the daytime use I-S and flutter valve for pulmonary toiletry.  Will advance activity and titrate down oxygen as possible.   - He is on cefazolin for recent MSSA bacteremia with stop date of 07/17/2021 (pharm confirmed)    ESRD.   - Renal on board for fluid removal through HD.  Recent MSSA bacteremia.   - Cefazolin with a stop date of 07/17/2021 seen by ID this admission.  Dysphagia.   - Speech therapy following currently on dysphagia 2 diet.  Kindly see #1 above.  AOCD.  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   - He has received 2 units of packed RBC on 06/24/2021 but H&H has not come up as much as I expect, no sign of ongoing bleeding, CT abdomen pelvis rules out retroperitoneal bleed.  Will check LDH, haptoglobin and DIC panel.  Continue to monitor CBC closely, transfuse as needed.  Also on 06/24/2021 his dialysis was euvolemic i.e. fluid was not removed hence this could be delusional as well.  Have discussed with nephrology for dialysis early morning 06/26/2021 with fluid removal as much as his blood pressure permits.  Will monitor closely. -GI consulted, clinically patient at high risk for  sedation/endoscopic procedure   Obesity with OSA. -  BMI 35 follow with PCP for weight loss nightly CPAP.  Counseled on compliance  Recent left ankle fracture.  -  Nonweightbearing left leg per last admission, outpatient orthopedics follow-up.  PT OT.  Hypothyroidism.  On Synthroid   Chronic A. fib [place] vas 2 score of greater than 4.  On Eliquis continue low-dose beta-blocker as tolerated by blood pressure.    Hypotension.  Blood pressure has stabilized and improving titrate off midodrine.  Dyslipidemia.  On Synthroid continue  Hyponatremia -Sodium low at 125 today, management with dialysis.  History of prostate cancer - Continue Xtandi.  Elevated troponin.   - Likely demand ischemia in the setting of ESRD, demand mismatch caused by hypoxia from #1 above, chest pain-free.  Cardiology on board, on Eliquis, statin, low-dose beta-blocker as tolerated by blood pressure and monitor  Severe MR.  -  Outpatient cardiology follow-up.  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  History of COVID-19 infection in the past, still COVID-19 positive.  Repeat test on 06/25/2021 is still positive but cycle threshold is 39  i.e. he is not contagious and this is not an active infection.    DM type II.  -  On Lantus and sliding scale.  Monitor and adjust  Lab Results  Component Value Date   HGBA1C 6.1 (H) 06/01/2021    CBG (last 3)  Recent Labs    06/25/21 1215 06/25/21 1656 06/25/21 2028  GLUCAP 205* 152* 176*           Condition - Extremely Guarded  Family Communication  : None at bedside  Code Status : DNR  Consults  : Cardiology, Pulmonary  PUD Prophylaxis :    Procedures  :     CT Scan abdomen pelvis repeat on 06/25/2021 without contrast.  No evidence of retroperitoneal bleed.  CT scan - 1. The appearance of the chest  suggest congestive heart failure, as above. 2. In addition, there is a new thick-walled cavitary nodule measuring 1.4 x 1.2 cm in the right upper lobe. This  is potentially infectious or inflammatory in etiology, however, underlying neoplasm is not excluded. Short-term follow-up repeat noncontrast chest CT is recommended in 2-3 months to ensure the stability or regression of this lesion. 3. Mildly enlarged right paratracheal lymph node, nonspecific in the setting of presumed congestive heart failure, but concerning in light of the right upper lobe pulmonary nodule. Attention at time of repeat CT examination is recommended to ensure stability/regression. 4. Diffuse sclerotic lesions noted throughout the visualized axial and appendicular skeleton, presumably reflective of widespread osseous metastasis in this patient with history of prostate cancer. 5. Aortic atherosclerosis, in addition to left main and three-vessel coronary artery disease. Please note that although the presence of coronary artery calcium documents the presence of coronary artery disease, the severity of this disease and any potential stenosis cannot be assessed on this non-gated CT examination. Assessment for potential risk factor modification, dietary therapy or pharmacologic therapy may be warranted, if clinically indicated. 6. There are calcifications of the aortic valve. Echocardiographic correlation for evaluation of potential valvular dysfunction may be warranted if clinically indicated. Aortic Atherosclerosis (ICD10-I70.0).      Disposition Plan  :    Status is: Inpatient  Remains inpatient appropriate because: Acute hypoxic respiratory failure  DVT Prophylaxis  :    apixaban (ELIQUIS) tablet 2.5 mg Start: 06/27/21 1000 apixaban (ELIQUIS) tablet 2.5 mg     Lab Results  Component Value Date   PLT 131 (L) 06/26/2021    Diet :  Diet Order             Diet renal with fluid restriction Fluid restriction: 1200 mL Fluid; Room service appropriate? Yes; Fluid consistency: Thin  Diet effective now                    Inpatient Medications  Scheduled Meds:  amitriptyline   100 mg Oral QHS   [START ON 06/27/2021] apixaban  2.5 mg Oral BID   atorvastatin  80 mg Oral QPM   chlorhexidine  15 mL Mouth Rinse BID   Chlorhexidine Gluconate Cloth  6 each Topical Q0600   darbepoetin (ARANESP) injection - DIALYSIS  200 mcg Intravenous Q Wed-HD   insulin aspart  0-6 Units Subcutaneous TID WC   insulin glargine-yfgn  10 Units Subcutaneous QHS   levothyroxine  100 mcg Oral QAC breakfast   lidocaine-prilocaine  1 application Topical Q M,W,F   mouth rinse  15 mL Mouth Rinse q12n4p   metoprolol tartrate  12.5 mg Oral BID   midodrine  10 mg Oral TID WC   multivitamin with minerals  1 tablet Oral Daily   pantoprazole (PROTONIX) IV  40 mg Intravenous Q12H   sertraline  100 mg Oral Daily   Continuous Infusions:  [START ON 06/27/2021] sodium chloride     [START ON 06/27/2021] sodium chloride      ceFAZolin (ANCEF) IV 2 g (06/24/21 1308)   PRN Meds:.[START ON 06/27/2021] sodium chloride, [START ON 06/27/2021] sodium chloride, acetaminophen **OR** acetaminophen, albuterol, [START ON 06/27/2021] alteplase, diphenhydrAMINE, diphenoxylate-atropine, guaiFENesin, [START ON 06/27/2021] heparin, [START ON 06/27/2021] lidocaine (PF), [START ON 06/27/2021] lidocaine-prilocaine, ondansetron (ZOFRAN) IV, [START ON 06/27/2021] pentafluoroprop-tetrafluoroeth      Phillips Climes M.D on 06/26/2021 at 9:58 AM  To page go to [email or web address]   [facility] -  Office  [phone]  See all Orders  from today for further details    Objective:   Vitals:   06/26/21 0800 06/26/21 0830 06/26/21 0900 06/26/21 0930  BP: 130/75 119/76 123/65 111/64  Pulse:      Resp: 20 (!) 22    Temp:      TempSrc:      SpO2: 100%     Weight:      Height:        Wt Readings from Last 3 Encounters:  06/26/21 122.3 kg  06/14/21 117.6 kg  07/26/20 125.3 kg     Intake/Output Summary (Last 24 hours) at 06/26/2021 1610 Last data filed at 06/25/2021 2049 Gross per 24 hour  Intake 318 ml  Output --  Net  318 ml      Physical Exam  Awake Alert, sleepy, but wakes up and open eyes, answering questions, patient appears much older than stated age, deconditioned and debilitated.   Symmetrical Chest wall movement, Good air movement bilaterally, CTAB RRR,No Gallops,Rubs or new Murmurs, No Parasternal Heave +ve B.Sounds, Abd Soft, No tenderness, No rebound - guarding or rigidity. No Cyanosis, Clubbing or edema, No new Rash or bruise     RN pressure injury documentation: Pressure Injury 06/11/21 Coccyx Medial Stage 2 -  Partial thickness loss of dermis presenting as a shallow open injury with a red, pink wound bed without slough. (Active)  06/11/21 2159  Location: Coccyx  Location Orientation: Medial  Staging: Stage 2 -  Partial thickness loss of dermis presenting as a shallow open injury with a red, pink wound bed without slough.  Wound Description (Comments):   Present on Admission:      Data Review:   Recent Labs  Lab 06/20/21 0256 06/21/21 0147 06/22/21 0044 06/23/21 0057 06/24/21 0056 06/25/21 0609 06/25/21 0927 06/25/21 1208 06/25/21 1409 06/25/21 2147 06/26/21 0231  WBC 5.8 7.0 5.8 7.6 7.9 6.4 6.0  --  5.8 7.7 7.9  HGB 7.3* 7.8* 7.3* 7.1* 6.6* 7.5* 7.3*  --  7.1* 8.3* 8.1*  HCT 24.9* 27.0* 25.3* 24.3* 22.4* 24.6* 24.0*  --  24.4* 26.7* 27.2*  PLT 179 184 177 161 150 128* 126* 130* 134* 136* 131*  MCV 95.4 96.8 97.7 98.0 97.8 96.5 96.0  --  96.4 97.1 96.1  MCH 28.0 28.0 28.2 28.6 28.8 29.4 29.2  --  28.1 30.2 28.6  MCHC 29.3* 28.9* 28.9* 29.2* 29.5* 30.5 30.4  --  29.1* 31.1 29.8*  RDW 17.8* 18.5* 19.2* 20.1* 21.0* 21.8* 21.2*  --  21.2* 20.6* 20.6*  LYMPHSABS 0.6* 0.6* 0.7 0.7 0.7  --   --   --   --   --   --   MONOABS 0.5 0.5 0.5 0.7 0.9  --   --   --   --   --   --   EOSABS 0.1 0.1 0.1 0.1 0.1  --   --   --   --   --   --   BASOSABS 0.0 0.1 0.0 0.0 0.0  --   --   --   --   --   --     Recent Labs  Lab 06/20/21 0256 06/21/21 0120 06/21/21 0146 06/21/21 0147  06/22/21 0044 06/23/21 0057 06/24/21 0056 06/25/21 0159 06/25/21 1208 06/26/21 0231  NA 134* 132*  --   --  131* 133* 127* 131*  --  125*  K 4.0 4.4  --   --  3.8 3.8 3.8 4.1  --  4.6  CL 95* 95*  --   --  95* 94* 90* 94*  --  89*  CO2 29 24  --   --  28 29 25 29   --  25  GLUCOSE 134* 138*  --   --  91 205* 176* 137*  --  167*  BUN 24* 31*  --   --  21 37* 56* 29*  --  40*  CREATININE 4.04* 5.21*  --   --  3.66* 4.86* 6.14* 4.18*  --  5.40*  CALCIUM 7.8* 8.3*  --   --  8.0* 8.1* 8.0* 8.1*  --  8.1*  AST 24 29  --   --   --   --   --   --   --   --   ALT 5 5  --   --   --   --   --   --   --   --   ALKPHOS 109 120  --   --   --   --   --   --   --   --   BILITOT 0.5 0.7  --   --   --   --   --   --   --   --   ALBUMIN 2.0*   2.0* 2.2*   2.2*  --   --  2.0* 2.0* 2.1* 2.1*  --  2.0*  CRP 6.2*  --   --  6.2* 5.1* 5.9*  --   --   --   --   DDIMER  --   --   --   --   --   --   --   --  1.29*  --   INR  --   --   --   --   --   --   --   --  1.7*  --   BNP 2,017.9*  --  1,612.0*  --  1,418.4* 1,284.6*  --   --   --   --      Radiology Reports CT ABDOMEN PELVIS WO CONTRAST  Result Date: 06/25/2021 CLINICAL DATA:  Coronavirus pneumonia. Anemia. Anemia is worsening. EXAM: CT ABDOMEN AND PELVIS WITHOUT CONTRAST TECHNIQUE: Multidetector CT imaging of the abdomen and pelvis was performed following the standard protocol without IV contrast. RADIATION DOSE REDUCTION: This exam was performed according to the departmental dose-optimization program which includes automated exposure control, adjustment of the mA and/or kV according to patient size and/or use of iterative reconstruction technique. COMPARISON:  Chest CT 06/16/2021.  CT pelvis 05/19/2019 FINDINGS: Lower chest: Moderate bilateral effusions layering dependently with dependent atelectasis. Non dependent lung appears largely clear. The heart is enlarged. There is a small amount pericardial fluid. Hepatobiliary: Liver parenchyma appears normal  without contrast. No calcified gallstones. Pancreas: Normal Spleen: Normal Adrenals/Urinary Tract: Adrenal glands are normal. The kidneys are atrophic. Simple cyst on both sides. No hydronephrosis. No visible bladder abnormality. Stomach/Bowel: Stomach and small intestine are normal. No visible colon pathology. Vascular/Lymphatic: Aortic atherosclerosis. No aneurysm. IVC is normal. No adenopathy. No retroperitoneal hematoma. Reproductive: Normal Other: No free fluid or air.  No evidence of body wall hemorrhage. Musculoskeletal: Diffusely abnormal appearance of the bones, with a mixed lytic and sclerotic pattern most often associated with widespread malignancy in this patient with a history of metastatic prostate cancer. No evidence of dominant bone lesion. No definite evidence of progression when compared to a CT scan of May 19, 2019. IMPRESSION: No hemorrhage or hematoma seen to explain worsening anemia. Bilateral pleural  effusions layering dependently with dependent pulmonary atelectasis. Cardiomegaly. Aortic Atherosclerosis (ICD10-I70.0). Bilateral renal atrophy. 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 No visible progression since a study of 2020. Electronically Signed   By: Nelson Chimes M.D.   On: 06/25/2021 08:54     Patient ID: [name], male   DOB: 1956-01-15, 66 y.o.   MRN: [id number]

## 2021-06-26 NOTE — Progress Notes (Signed)
Speech Language Pathology Treatment: Dysphagia  Patient Details Name: Richard Hall MRN: [id number] DOB: 03/07/1956 Today's Date: 06/26/2021 Time: 1240-1250 SLP Time Calculation (min) (ACUTE ONLY): 10 min  Assessment / Plan / Recommendation Clinical Impression  Pt observed with PO with goal of upgrade to regular solids. Pt is missing dentition and does need extra time for mastication and bolus formation,b ut is able to manage regular textures adequately. However, during assessment pt observed to have frequent coughing after sips of thin liquids, as well as moderate dysphonia. Further chart review shows MD concern for micro aspiration. Will proceed with MBS tomorrow for instrumental assessment of swallowing given clinical concerns.   HPI HPI: 66 y.o. male with medical history significant of hypertension, atrial fibrillation, congestive heart failure, CVA, ESRD on HD, and prostate cancer. Recently d/c (admitted 12/20-1/13)from hospital to SNF and readmitted with acute respiratory failure due to overload. Patient had been seen by ST services during previous admission and at that time was tolerating a Dys 2, thin liquids diet; ST continuing to f/u for diet progression.      SLP Plan  MBS      Recommendations for follow up therapy are one component of a multi-disciplinary discharge planning process, led by the attending physician.  Recommendations may be updated based on patient status, additional functional criteria and insurance authorization.    Recommendations  Diet recommendations: Regular;Thin liquid Liquids provided via: Straw Medication Administration: Whole meds with puree Supervision: Patient able to self feed;Intermittent supervision to cue for compensatory strategies Compensations: Slow rate;Small sips/bites Postural Changes and/or Swallow Maneuvers: Seated upright 90 degrees                Plan: MBS          Richard Baltimore, MA CCC-SLP  Acute Rehabilitation  Services Pager [phone] Office [phone]  Lynann Beaver  06/26/2021, 1:30 PM

## 2021-06-26 NOTE — Plan of Care (Signed)

## 2021-06-26 NOTE — Progress Notes (Signed)
SLP Cancellation Note  Patient Details Name: Richard Hall MRN: [id number] DOB: [birth year]   Cancelled treatment:       Reason Eval/Treat Not Completed: Patient at procedure or test/unavailable;Other (comment) (patient getting HD, SLP will continue to follow. Per RN, report she has gotten states that he has been doing fine without observed difficulty with PO intake.)  Sonia Baller, MA, CCC-SLP Speech Therapy

## 2021-06-27 ENCOUNTER — Inpatient Hospital Stay (HOSPITAL_COMMUNITY): Payer: Medicare Other

## 2021-06-27 LAB — CBC
HCT: 25.4 % — ABNORMAL LOW (ref 39.0–52.0)
Hemoglobin: 7.8 g/dL — ABNORMAL LOW (ref 13.0–17.0)
MCH: 29.5 pg (ref 26.0–34.0)
MCHC: 30.7 g/dL (ref 30.0–36.0)
MCV: 96.2 fL (ref 80.0–100.0)
Platelets: 130 10*3/uL — ABNORMAL LOW (ref 150–400)
RBC: 2.64 MIL/uL — ABNORMAL LOW (ref 4.22–5.81)
RDW: 20.8 % — ABNORMAL HIGH (ref 11.5–15.5)
WBC: 6.2 10*3/uL (ref 4.0–10.5)
nRBC: 0.5 % — ABNORMAL HIGH (ref 0.0–0.2)

## 2021-06-27 LAB — GLUCOSE, CAPILLARY
Glucose-Capillary: 134 mg/dL — ABNORMAL HIGH (ref 70–99)
Glucose-Capillary: 180 mg/dL — ABNORMAL HIGH (ref 70–99)
Glucose-Capillary: 169 mg/dL — ABNORMAL HIGH (ref 70–99)
Glucose-Capillary: 183 mg/dL — ABNORMAL HIGH (ref 70–99)

## 2021-06-27 LAB — RENAL FUNCTION PANEL
Albumin: 1.9 g/dL — ABNORMAL LOW (ref 3.5–5.0)
Anion gap: 8 (ref 5–15)
BUN: 30 mg/dL — ABNORMAL HIGH (ref 8–23)
CO2: 28 mmol/L (ref 22–32)
Calcium: 7.9 mg/dL — ABNORMAL LOW (ref 8.9–10.3)
Chloride: 93 mmol/L — ABNORMAL LOW (ref 98–111)
Creatinine, Ser: 4.17 mg/dL — ABNORMAL HIGH (ref 0.61–1.24)
GFR, Estimated: 15 mL/min — ABNORMAL LOW (ref >60)
Glucose, Bld: 154 mg/dL — ABNORMAL HIGH (ref 70–99)
Phosphorus: 4.5 mg/dL (ref 2.5–4.6)
Potassium: 4.4 mmol/L (ref 3.5–5.1)
Sodium: 129 mmol/L — ABNORMAL LOW (ref 135–145)

## 2021-06-27 MED ORDER — APIXABAN 2.5 MG PO TABS
2.5000 mg | ORAL_TABLET | Freq: Two times a day (BID) | ORAL | Status: DC
  Administered 2021-06-27 – 2021-07-01 (×9): 2.5 mg via ORAL
  Filled 2021-06-27 (×9): qty 1

## 2021-06-27 MED ORDER — SENNOSIDES-DOCUSATE SODIUM 8.6-50 MG PO TABS
2.0000 | ORAL_TABLET | Freq: Two times a day (BID) | ORAL | Status: DC
  Administered 2021-06-27 – 2021-07-02 (×11): 2 via ORAL
  Filled 2021-06-27 (×11): qty 2

## 2021-06-27 NOTE — Progress Notes (Signed)
PROGRESS NOTE                                                                                                                                                                                                             Patient Demographics:    Richard Hall, is a 66 y.o. male, DOB - [birth year], DXA:[id number]  Outpatient Primary MD for the patient is [name], MD    LOS - 12  Admit date - 06/15/2021    Chief Complaint  Patient presents with   Shortness of Breath       Brief Narrative (HPI from H&P)      Richard Hall is a 66 y.o. male with medical history significant of hypertension, atrial fibrillation, congestive heart failure, CVA, ESRD on HD, and prostate cancer presents with complaints of shortness of breath.  Patient had just recently been hospitalized from 12/30-1/13 after initially presenting with septic shock related to MSSA bacteremia and COVID-19 viral pneumonia.  During his hospital stay patient required intubation.  ID had been consulted and patient was recommended to complete 6 weeks of cefazolin to be completed on 07/17/2021.  He went to the rehab facility but soon was sent back to the hospital for hypoxia, in the ER his work-up was suggestive of possible continued pneumonia, troponin, cardiology was consulted and he was admitted for further care.   Subjective:   No significant events overnight as discussed with staff, patient reported generalized weakness and fatigue, as well poor appetite.    Assessment  & Plan :   Acute Hypoxic Resp. Failure  - due to Acute on chronic diastolic CHF in the presence of severe MR, EF 60% on recent echo with NSTEMI in a patient with ESRD and recent COVID-19 pneumonia with a new cavitary lesion on CT scan ?  Chronic intermittent microaspiration   Acute on chronic diastolic CHF -Volume management with hemodialysis.  cavitary pneumonia - no sepsis this admission,  however does have leukocytosis with a cavitary lesion on CT scan raising the suspicion for possible underlying aspiration, SLP following, also continue fluid removal through HD, renal and cardiology on board will also request pulmonary to opine on his cavitary lung lesion. CRP and procalcitonin also point towards a bacterial infectious etiology as well.  He was briefly placed on Unasyn with excellent clinical improvement, cultures here  this admission negative thus far.  Have been consulted ID discussed the plan with pulmonary on 06/20/2021 plan is to continue cefazolin till 07/17/2021 thereafter outpatient ID and pulmonary follow-up.   - Encouraged to sit up in chair in the daytime use I-S and flutter valve for pulmonary toiletry.  Will advance activity and titrate down oxygen as possible.   - He is on cefazolin for recent MSSA bacteremia with stop date of 07/17/2021 (pharm confirmed)   ESRD.   - Renal on board for fluid removal through HD.  Recent MSSA bacteremia.   - Cefazolin with a stop date of 07/17/2021 seen by ID this admission.  Dysphagia.   - Speech therapy following currently on dysphagia 2 diet.  Kindly see #1 above.  AOCD.  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   -Patient with anemia, required 3 units PRBC this hospital stay. -Concern for chronic blood loss anemia, GI consulted, patient is high risk for procedures. -Resumed on Eliquis today at 2.5 mg p.o. twice daily, monitor CBC closely -On Aranesp and IV iron per renal  Obesity with OSA. -  BMI 35 follow with PCP for weight loss nightly CPAP.  Counseled on compliance  Recent left ankle fracture.  -  Nonweightbearing left leg per last admission, outpatient  orthopedics follow-up.  PT OT.  Hypothyroidism.   -On Synthroid   Chronic A. fib [place] vas 2 score of greater than 4.   - On Eliquis continue low-dose beta-blocker as tolerated by blood pressure.    Hypotension.   -Blood pressure has stabilized and improving titrate off midodrine.  Dyslipidemia.   -On Statin  Hyponatremia - management with dialysis.  History of prostate cancer - Continue Xtandi.  Elevated troponin.   - Likely demand ischemia in the setting of ESRD, demand mismatch caused by hypoxia from #1 above, chest pain-free.  Cardiology on board, on Eliquis, statin, low-dose beta-blocker as tolerated by blood pressure and monitor  Severe MR.  -  Outpatient cardiology follow-up.  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  History of COVID-19 infection in the past, still COVID-19 positive.  Repeat test on 06/25/2021 is still positive but cycle threshold is 39  i.e. he is not contagious and this is not an active infection.    DM type II.  -  On Lantus and sliding scale.  Monitor and adjust  Lab Results  Component Value Date   HGBA1C 6.1 (H) 06/01/2021    CBG (last 3)  Recent Labs    06/26/21 2009 06/27/21 0848 06/27/21 1253  GLUCAP 208* 134* 180*           Condition - Extremely Guarded  Family Communication  : None at bedside  Code Status : DNR  Consults  : Cardiology, Pulmonary, renal  PUD Prophylaxis :    Procedures  :     CT Scan abdomen pelvis repeat on 06/25/2021 without contrast.  No evidence of retroperitoneal bleed.  CT scan - 1. The appearance of the chest suggest congestive heart failure, as above. 2. In addition, there is a new thick-walled cavitary nodule measuring 1.4 x 1.2 cm in the right upper lobe. This is potentially infectious or inflammatory in etiology, however, underlying neoplasm is not excluded. Short-term follow-up repeat  noncontrast chest CT is recommended in 2-3 months to ensure the stability or regression of this lesion. 3. Mildly  enlarged right paratracheal lymph node, nonspecific in the setting of presumed congestive heart failure, but concerning in light of the right upper lobe pulmonary nodule. Attention at time of repeat CT examination is recommended to ensure stability/regression. 4. Diffuse sclerotic lesions noted throughout the visualized axial and appendicular skeleton, presumably reflective of widespread osseous metastasis in this patient with history of prostate cancer. 5. Aortic atherosclerosis, in addition to left main and three-vessel coronary artery disease. Please note that although the presence of coronary artery calcium documents the presence of coronary artery disease, the severity of this disease and any potential stenosis cannot be assessed on this non-gated CT examination. Assessment for potential risk factor modification, dietary therapy or pharmacologic therapy may be warranted, if clinically indicated. 6. There are calcifications of the aortic valve. Echocardiographic correlation for evaluation of potential valvular dysfunction may be warranted if clinically indicated. Aortic Atherosclerosis (ICD10-I70.0).      Disposition Plan  :    Status is: Inpatient  Remains inpatient appropriate because: Acute hypoxic respiratory failure  DVT Prophylaxis  :    apixaban (ELIQUIS) tablet 2.5 mg Start: 06/27/21 1000 apixaban (ELIQUIS) tablet 2.5 mg     Lab Results  Component Value Date   PLT 130 (L) 06/27/2021    Diet :  Diet Order             Diet renal with fluid restriction Fluid restriction: 1200 mL Fluid; Room service appropriate? Yes; Fluid consistency: Thin  Diet effective now                    Inpatient Medications  Scheduled Meds:  amitriptyline  100 mg Oral QHS   apixaban  2.5 mg Oral BID   atorvastatin  80 mg Oral QPM   chlorhexidine  15 mL Mouth Rinse BID   Chlorhexidine Gluconate Cloth  6 each Topical Q0600   darbepoetin (ARANESP) injection - DIALYSIS  200 mcg Intravenous Q  Wed-HD   insulin aspart  0-6 Units Subcutaneous TID WC   insulin glargine-yfgn  10 Units Subcutaneous QHS   levothyroxine  100 mcg Oral QAC breakfast   lidocaine-prilocaine  1 application Topical Q M,W,F   mouth rinse  15 mL Mouth Rinse q12n4p   metoprolol tartrate  12.5 mg Oral BID   midodrine  10 mg Oral TID WC   multivitamin with minerals  1 tablet Oral Daily   pantoprazole (PROTONIX) IV  40 mg Intravenous Q12H   sertraline  100 mg Oral Daily   Continuous Infusions:   ceFAZolin (ANCEF) IV 2 g (06/26/21 1535)   PRN Meds:.acetaminophen **OR** acetaminophen, albuterol, calcium carbonate, diphenhydrAMINE, diphenoxylate-atropine, guaiFENesin, ondansetron (ZOFRAN) IV      Phillips Climes M.D on 06/27/2021 at 1:44 PM  To page go to [email or web address]   [facility] -  Office  [phone]  See all Orders from today for further details    Objective:   Vitals:   06/27/21 0440 06/27/21 0858 06/27/21 0902 06/27/21 1249  BP: 115/61 125/71 125/71 111/69  Pulse: 70 62 69 64  Resp: 19  19 20   Temp: 98.3 F (36.8 C)  98.2 F (36.8 C) (!) 97.4 F (36.3 C)  TempSrc: Axillary  Oral Oral  SpO2: 100%  100% 100%  Weight: 121.4 kg     Height:        Wt Readings from Last 3 Encounters:  06/27/21 121.4  kg  06/14/21 117.6 kg  07/26/20 125.3 kg    No intake or output data in the 24 hours ending 06/27/21 1344     Physical Exam  Awake Alert, frail, appears much older than stated age, deconditioned.   Symmetrical Chest wall movement, no wheezing RRR,No Gallops,Rubs or new Murmurs, No Parasternal Heave +ve B.Sounds, Abd Soft, No tenderness, No rebound - guarding or rigidity. No Cyanosis, Clubbing or edema, No new Rash or bruise      RN pressure injury documentation: Pressure Injury 06/11/21 Coccyx Medial Stage 2 -  Partial thickness loss of dermis presenting as a shallow open injury with a red, pink wound bed without slough. (Active)  06/11/21 2159  Location: Coccyx   Location Orientation: Medial  Staging: Stage 2 -  Partial thickness loss of dermis presenting as a shallow open injury with a red, pink wound bed without slough.  Wound Description (Comments):   Present on Admission:      Data Review:   Recent Labs  Lab 06/21/21 0147 06/22/21 0044 06/23/21 0057 06/24/21 0056 06/25/21 0609 06/25/21 0927 06/25/21 1208 06/25/21 1409 06/25/21 2147 06/26/21 0231 06/27/21 0113  WBC 7.0 5.8 7.6 7.9   < > 6.0  --  5.8 7.7 7.9 6.2  HGB 7.8* 7.3* 7.1* 6.6*   < > 7.3*  --  7.1* 8.3* 8.1* 7.8*  HCT 27.0* 25.3* 24.3* 22.4*   < > 24.0*  --  24.4* 26.7* 27.2* 25.4*  PLT 184 177 161 150   < > 126* 130* 134* 136* 131* 130*  MCV 96.8 97.7 98.0 97.8   < > 96.0  --  96.4 97.1 96.1 96.2  MCH 28.0 28.2 28.6 28.8   < > 29.2  --  28.1 30.2 28.6 29.5  MCHC 28.9* 28.9* 29.2* 29.5*   < > 30.4  --  29.1* 31.1 29.8* 30.7  RDW 18.5* 19.2* 20.1* 21.0*   < > 21.2*  --  21.2* 20.6* 20.6* 20.8*  LYMPHSABS 0.6* 0.7 0.7 0.7  --   --   --   --   --   --   --   MONOABS 0.5 0.5 0.7 0.9  --   --   --   --   --   --   --   EOSABS 0.1 0.1 0.1 0.1  --   --   --   --   --   --   --   BASOSABS 0.1 0.0 0.0 0.0  --   --   --   --   --   --   --    < > = values in this interval not displayed.    Recent Labs  Lab 06/21/21 0120 06/21/21 0146 06/21/21 0147 06/22/21 0044 06/23/21 0057 06/24/21 0056 06/25/21 0159 06/25/21 1208 06/26/21 0231 06/27/21 0113  NA 132*  --   --  131* 133* 127* 131*  --  125* 129*  K 4.4  --   --  3.8 3.8 3.8 4.1  --  4.6 4.4  CL 95*  --   --  95* 94* 90* 94*  --  89* 93*  CO2 24  --   --  28 29 25 29   --  25 28  GLUCOSE 138*  --   --  91 205* 176* 137*  --  167* 154*  BUN 31*  --   --  21 37* 56* 29*  --  40* 30*  CREATININE 5.21*  --   --  3.66* 4.86* 6.14* 4.18*  --  5.40* 4.17*  CALCIUM 8.3*  --   --  8.0* 8.1* 8.0* 8.1*  --  8.1* 7.9*  AST 29  --   --   --   --   --   --   --   --   --   ALT 5  --   --   --   --   --   --   --   --   --    ALKPHOS 120  --   --   --   --   --   --   --   --   --   BILITOT 0.7  --   --   --   --   --   --   --   --   --   ALBUMIN 2.2*   2.2*  --   --  2.0* 2.0* 2.1* 2.1*  --  2.0* 1.9*  CRP  --   --  6.2* 5.1* 5.9*  --   --   --   --   --   DDIMER  --   --   --   --   --   --   --  1.29*  --   --   INR  --   --   --   --   --   --   --  1.7*  --   --   BNP  --  1,612.0*  --  1,418.4* 1,284.6*  --   --   --   --   --      Radiology Reports CT ABDOMEN PELVIS WO CONTRAST  Result Date: 06/25/2021 CLINICAL DATA:  Coronavirus pneumonia. Anemia. Anemia is worsening. EXAM: CT ABDOMEN AND PELVIS WITHOUT CONTRAST TECHNIQUE: Multidetector CT imaging of the abdomen and pelvis was performed following the standard protocol without IV contrast. RADIATION DOSE REDUCTION: This exam was performed according to the departmental dose-optimization program which includes automated exposure control, adjustment of the mA and/or kV according to patient size and/or use of iterative reconstruction technique. COMPARISON:  Chest CT 06/16/2021.  CT pelvis 05/19/2019 FINDINGS: Lower chest: Moderate bilateral effusions layering dependently with dependent atelectasis. Non dependent lung appears largely clear. The heart is enlarged. There is a small amount pericardial fluid. Hepatobiliary: Liver parenchyma appears normal without contrast. No calcified gallstones. Pancreas: Normal Spleen: Normal Adrenals/Urinary Tract: Adrenal glands are normal. The kidneys are atrophic. Simple cyst on both sides. No hydronephrosis. No visible bladder abnormality. Stomach/Bowel: Stomach and small intestine are normal. No visible colon pathology. Vascular/Lymphatic: Aortic atherosclerosis. No aneurysm. IVC is normal. No adenopathy. No retroperitoneal hematoma. Reproductive: Normal Other: No free fluid or air.  No evidence of body wall hemorrhage. Musculoskeletal: Diffusely abnormal appearance of the bones, with a mixed lytic and sclerotic pattern most often  associated with widespread malignancy in this patient with a history of metastatic prostate cancer. No evidence of dominant bone lesion. No definite evidence of progression when compared to a CT scan of May 19, 2019. IMPRESSION: No hemorrhage or hematoma seen to explain worsening anemia. Bilateral pleural effusions layering dependently with dependent pulmonary atelectasis. Cardiomegaly. Aortic Atherosclerosis (ICD10-I70.0). Bilateral renal atrophy. 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 No visible progression since a study of 2020. Electronically Signed   By: Nelson Chimes M.D.   On: 06/25/2021 08:54   DG Swallowing Func-Speech Pathology  Result Date: 06/27/2021 Table  formatting from the original result was not included. Objective Swallowing Evaluation: Type of Study: MBS-Modified Barium Swallow Study  Patient Details Name: Richard Hall MRN: [id number] Date of Birth: [birth year] Today's Date: 06/27/2021 Time: SLP Start Time (ACUTE ONLY): 1200 -SLP Stop Time (ACUTE ONLY): 6962 SLP Time Calculation (min) (ACUTE ONLY): 15 min Past Medical History: Past Medical History: Diagnosis Date  A-V fistula ([place])   left arm  Anemia   Arthritis   CHF (congestive heart failure) ([place])   CVA (cerebral vascular accident) ([place]) 2010  Diabetes mellitus with end-stage renal disease ([place])   Dyalisis pt.  Diabetic neuropathy ([facility])   Bilateral feet  Dyslipidemia   Dysrhythmia   A-fib  Essential hypertension   Hepatitis   childhood  History of blood transfusion   Hypothyroidism (acquired)   OSA on CPAP   Prostate cancer [facility])   Uses wheelchair  Past Surgical History: Past Surgical History: Procedure Laterality Date  APPENDECTOMY    COLONOSCOPY    FEMUR IM NAIL Left 05/20/2019  Procedure: Intramedullary (Im) Retrograde Femoral Nailing for impending pathologic fracture;  Surgeon: Altamese New Holland, MD;  Location: [place];  Service: Orthopedics;  Laterality: Left;  ORIF  FEMUR FRACTURE Left 05/20/2019  Procedure: OPEN REDUCTION INTERNAL FIXATION (ORIF) DISTAL FEMUR FRACTURE with intercondylar extension;  Surgeon: Altamese Carlisle, MD;  Location: [place];  Service: Orthopedics;  Laterality: Left;  TRANSURETHRAL RESECTION OF BLADDER TUMOR N/A 01/07/2019  Procedure: CYSTOSCOPY/ EVEACUATION CLOT;  Surgeon: Bjorn Loser, MD;  Location: [facility];  Service: Urology;  Laterality: N/A;  TRANSURETHRAL RESECTION OF PROSTATE N/A 07/26/2020  Procedure: TRANSURETHRAL RESECTION OF THE PROSTATE (TURP);  Surgeon: Alexis Frock, MD;  Location: [facility];  Service: Urology;  Laterality: N/A;  93 MINS HPI: 66 y.o. male with medical history significant of hypertension, atrial fibrillation, congestive heart failure, CVA, ESRD on HD, and prostate cancer. Recently d/c (admitted 12/20-1/13)from hospital to SNF and readmitted with acute respiratory failure due to overload. Patient had been seen by ST services during previous admission and at that time was tolerating a Dys 2, thin liquids diet; ST continuing to f/u for diet progression.  Subjective: awake and alert but appears confused and apathetic  Recommendations for follow up therapy are one component of a multi-disciplinary discharge planning process, led by the attending physician.  Recommendations may be updated based on patient status, additional functional criteria and insurance authorization. Assessment / Plan / Recommendation Clinical Impressions 06/27/2021 Clinical Impression Pt exhibits a suspected primary esophageal dysphagia. Transit time with solid affected by missing dentition rather than impairment. Thin liquid was penetrated and remained in vestibule x 1 and minimal vallecular residue both of which are normal and typical findings. His esophagus was scanned revealing hesitation and slow transit with pill with statis ascending esophagus. Compensatory strategies were discussed with pt to mitigate risks. Recommend he continue Dys 3, thin liquid,  straws allowed, single pill with thin and remain upright 45 min after meals. Will follow up for esophageal education x 1. SLP Visit Diagnosis (No Data) Attention and concentration deficit following -- Frontal lobe and executive function deficit following -- Impact on safety and function Mild aspiration risk   Treatment Recommendations 06/27/2021 Treatment Recommendations Therapy as outlined in treatment plan below   Prognosis 06/27/2021 Prognosis for Safe Diet Advancement Good Barriers to Reach Goals -- Barriers/Prognosis Comment -- Diet Recommendations 06/27/2021 SLP Diet Recommendations Dysphagia 3 (Mech soft) solids;Thin liquid Liquid Administration via Cup;Straw Medication Administration Whole meds with liquid Compensations Slow rate;Small sips/bites Postural Changes Seated  upright at 90 degrees;Remain semi-upright after after feeds/meals (Comment)   Other Recommendations 06/27/2021 Recommended Consults Consider esophageal assessment Oral Care Recommendations Oral care BID Other Recommendations -- Follow Up Recommendations No SLP follow up Assistance recommended at discharge None Functional Status Assessment -- Frequency and Duration  06/27/2021 Speech Therapy Frequency (ACUTE ONLY) min 1 x/week Treatment Duration 1 week   Oral Phase 06/27/2021 Oral Phase WFL Oral - Pudding Teaspoon -- Oral - Pudding Cup -- Oral - Honey Teaspoon -- Oral - Honey Cup -- Oral - Nectar Teaspoon -- Oral - Nectar Cup -- Oral - Nectar Straw -- Oral - Thin Teaspoon -- Oral - Thin Cup -- Oral - Thin Straw -- Oral - Puree -- Oral - Mech Soft -- Oral - Regular -- Oral - Multi-Consistency -- Oral - Pill -- Oral Phase - Comment --  Pharyngeal Phase 06/27/2021 Pharyngeal Phase WFL Pharyngeal- Pudding Teaspoon -- Pharyngeal -- Pharyngeal- Pudding Cup -- Pharyngeal -- Pharyngeal- Honey Teaspoon -- Pharyngeal -- Pharyngeal- Honey Cup -- Pharyngeal -- Pharyngeal- Nectar Teaspoon -- Pharyngeal -- Pharyngeal- Nectar Cup -- Pharyngeal -- Pharyngeal- Nectar  Straw -- Pharyngeal -- Pharyngeal- Thin Teaspoon -- Pharyngeal -- Pharyngeal- Thin Cup -- Pharyngeal -- Pharyngeal- Thin Straw -- Pharyngeal -- Pharyngeal- Puree -- Pharyngeal -- Pharyngeal- Mechanical Soft -- Pharyngeal -- Pharyngeal- Regular -- Pharyngeal -- Pharyngeal- Multi-consistency -- Pharyngeal -- Pharyngeal- Pill -- Pharyngeal -- Pharyngeal Comment --  Cervical Esophageal Phase  06/27/2021 Cervical Esophageal Phase WFL Pudding Teaspoon -- Pudding Cup -- Honey Teaspoon -- Honey Cup -- Nectar Teaspoon -- Nectar Cup -- Nectar Straw -- Thin Teaspoon -- Thin Cup -- Thin Straw -- Puree -- Mechanical Soft -- Regular -- Multi-consistency -- Pill -- Cervical Esophageal Comment -- [place] Siren 06/27/2021, 1:11 PM                       Patient ID: [name], male   DOB: [birth year], 66 y.o.   MRN: [id number] Patient ID: Blaire Hodsdon, male   DOB: Apr 28, 1956, 66 y.o.   MRN: [id number]

## 2021-06-27 NOTE — Progress Notes (Signed)
Pt placed on BIPAP 16/6 with 3L O2 bled in. Advised pt to notify for RN if he needed to come off BIPAP so they could assist with his O2. RT will continue to monitor.

## 2021-06-27 NOTE — Progress Notes (Signed)
Modified Barium Swallow Progress Note  Patient Details  Name: Richard Hall MRN: [id number] Date of Birth: 08/09/1955  Today's Date: 06/27/2021  Modified Barium Swallow completed.  Full report located under Chart Review in the Imaging Section.  Brief recommendations include the following:  Clinical Impression  Pt exhibits a suspected primary esophageal dysphagia. Transit time with solid affected by missing dentition rather than impairment. Thin liquid was penetrated and remained in vestibule x 1 and minimal vallecular residue both of which are normal and typical findings. His esophagus was scanned revealing hesitation and slow transit with pill with statis ascending esophagus. Compensatory strategies were discussed with pt to mitigate risks. Recommend he continue Dys 3, thin liquid, straws allowed, single pill with thin and remain upright 45 min after meals. Will follow up for esophageal education x 1.   Swallow Evaluation Recommendations   Recommended Consults: Consider esophageal assessment   SLP Diet Recommendations: Dysphagia 3 (Mech soft) solids;Thin liquid   Liquid Administration via: Cup;Straw   Medication Administration: Whole meds with liquid   Supervision: Patient able to self feed (may need set up assist)   Compensations: Slow rate;Small sips/bites   Postural Changes: Seated upright at 90 degrees;Remain semi-upright after after feeds/meals (Comment)   Oral Care Recommendations: Oral care BID        Houston Siren 06/27/2021,1:12 PM

## 2021-06-27 NOTE — TOC Progression Note (Signed)
Transition of Care [facility]) - Progression Note    Patient Details  Name: Richard Hall MRN: [id number] Date of Birth: January 16, 1956  Transition of Care [facility]) CM/SW [place], LCSW Phone Number: 06/27/2021, 5:00 PM  Clinical Narrative:    CSW initiated insurance authorization for [employer] as the previous authorization expired, Ref # [id number].    Expected Discharge Plan: [place] Barriers to Discharge: Continued Medical Work up  Expected Discharge Plan and Services Expected Discharge Plan: [place] In-house Referral: Clinical Social Work   Post Acute Care Choice: [place] Living arrangements for the past 2 months: Single Family Home                                       Social Determinants of Health (SDOH) Interventions    Readmission Risk Interventions Readmission Risk Prevention Plan 06/21/2021 01/12/2019  Transportation Screening Complete Complete  PCP or Specialist Appt within 3-5 Days - Complete  HRI or [place] - Complete  Social Work Consult for [place] Planning/Counseling - Complete  Palliative Care Screening - Not Applicable  Medication Review Press photographer) Complete Complete  PCP or Specialist appointment within 3-5 days of discharge Complete -  [place] or Home Care Consult Complete -  SW Recovery Care/Counseling Consult Complete -  Palliative Care Screening Not Applicable -  Skilled Nursing Facility Complete -  Some recent data might be hidden

## 2021-06-27 NOTE — Progress Notes (Signed)
Physical Therapy Treatment Patient Details Name: Richard Hall MRN: [id number] DOB: [birth year] Today's Date: 06/27/2021   History of Present Illness 66 y.o. male with medical history significant of hypertension, atrial fibrillation, congestive heart failure, CVA, ESRD on HD, and prostate cancer. Recently d/c from hospital to SNF and readmitted with acute respiratory failure due to overload. On prior admission, found to have chronic non-healed Lt femur facture despite ORIF, will request NWB status LLE.    PT Comments    Pt making slow, steady progress. Able to assist to chair via scoot transfer which likely will allow him to be more independent since he is NWB on LLE.    Recommendations for follow up therapy are one component of a multi-disciplinary discharge planning process, led by the attending physician.  Recommendations may be updated based on patient status, additional functional criteria and insurance authorization.  Follow Up Recommendations  Skilled nursing-short term rehab (<3 hours/day)     Assistance Recommended at Discharge Frequent or constant Supervision/Assistance  Patient can return home with the following Two people to help with walking and/or transfers;Two people to help with bathing/dressing/bathroom;Assistance with cooking/housework;Assist for transportation;Help with stairs or ramp for entrance   Equipment Recommendations  None recommended by PT (To be determined at [facility])    Recommendations for Other Services       Precautions / Restrictions Precautions Precautions: Fall Restrictions Weight Bearing Restrictions: Yes LLE Weight Bearing: Non weight bearing     Mobility  Bed Mobility Overal bed mobility: Needs Assistance Bed Mobility: Supine to Sit     Supine to sit: Mod assist, +2 for physical assistance     General bed mobility comments: Assist to bring legs off of bed, elevate trunk into sitting, and bring hips into sitting.    Transfers Overall  transfer level: Needs assistance Equipment used: None Transfers: Bed to chair/wheelchair/BSC            Lateral/Scoot Transfers: Mod assist, +2 physical assistance, From elevated surface General transfer comment: Assist using bed pad under hips to assist scooting hips from bed to drop arm recliner. Verbal cues for technique    Ambulation/Gait                   Stairs             Wheelchair Mobility    Modified Rankin (Stroke Patients Only)       Balance Overall balance assessment: Needs assistance Sitting-balance support: Feet supported, No upper extremity supported Sitting balance-Leahy Scale: Fair                                      Cognition Arousal/Alertness: Awake/alert Behavior During Therapy: Flat affect Overall Cognitive Status: No family/caregiver present to determine baseline cognitive functioning                         Following Commands: Follows one step commands with increased time Safety/Judgement: Decreased awareness of deficits, Decreased awareness of safety     General Comments: slow to respond        Exercises      General Comments General comments (skin integrity, edema, etc.): VSS on 3L O2      Pertinent Vitals/Pain Pain Assessment Pain Assessment: No/denies pain    Home Living  Prior Function            PT Goals (current goals can now be found in the care plan section) Acute Rehab PT Goals Patient Stated Goal: Get well Progress towards PT goals: Progressing toward goals    Frequency    Min 2X/week      PT Plan Current plan remains appropriate    Co-evaluation PT/OT/SLP Co-Evaluation/Treatment: Yes Reason for Co-Treatment: For patient/therapist safety PT goals addressed during session: Mobility/safety with mobility OT goals addressed during session: ADL's and self-care      AM-PAC PT "6 Clicks" Mobility   Outcome Measure  Help needed  turning from your back to your side while in a flat bed without using bedrails?: A Lot Help needed moving from lying on your back to sitting on the side of a flat bed without using bedrails?: Total Help needed moving to and from a bed to a chair (including a wheelchair)?: Total Help needed standing up from a chair using your arms (e.g., wheelchair or bedside chair)?: Total Help needed to walk in hospital room?: Total Help needed climbing 3-5 steps with a railing? : Total 6 Click Score: 7    End of Session Equipment Utilized During Treatment: Oxygen Activity Tolerance: Patient tolerated treatment well Patient left: in chair;with call bell/phone within reach;with chair alarm set Nurse Communication: Mobility status;Need for lift equipment (maximove sling in chair behing pt) PT Visit Diagnosis: Other abnormalities of gait and mobility (R26.89);Muscle weakness (generalized) (M62.81);Other (comment)     Time: 2426-8341 PT Time Calculation (min) (ACUTE ONLY): 25 min  Charges:  $Therapeutic Activity: 8-22 mins                     Bradley Pager [phone] Office [place] 06/27/2021, 3:49 PM

## 2021-06-27 NOTE — Progress Notes (Signed)
[facility] Progress Note   Subjective:    Seen in room. No new complaints. No cp,dyspnea.    Objective Vitals:   06/27/21 0245 06/27/21 0440 06/27/21 0858 06/27/21 0902  BP: 117/64 115/61 125/71 125/71  Pulse: 72 70 62 69  Resp: 16 19  19   Temp: 98 F (36.7 C) 98.3 F (36.8 C)  98.2 F (36.8 C)  TempSrc: Axillary Axillary  Oral  SpO2: 97% 100%  100%  Weight:  121.4 kg    Height:       Physical Exam General: Sitting up beside the bed; NAD Heart: Normal S1 and S2; No murmurs, gallops, or rubs Lungs: Clear anteriorly and laterally; No wheezing, rales, or rhonchi Abdomen: Large, soft, and non-tender Extremities: 1+ edema noted LLE, improving  Dialysis Access:  L AVF (+) Bruit/Thrill   Filed Weights   06/25/21 0500 06/26/21 0500 06/27/21 0440  Weight: 121.2 kg 122.3 kg 121.4 kg    Intake/Output Summary (Last 24 hours) at 06/27/2021 1134 Last data filed at 06/26/2021 1140 Gross per 24 hour  Intake --  Output 3100 ml  Net -3100 ml     Additional Objective Labs: Basic Metabolic Panel: Recent Labs  Lab 06/25/21 0159 06/26/21 0231 06/27/21 0113  NA 131* 125* 129*  K 4.1 4.6 4.4  CL 94* 89* 93*  CO2 29 25 28   GLUCOSE 137* 167* 154*  BUN 29* 40* 30*  CREATININE 4.18* 5.40* 4.17*  CALCIUM 8.1* 8.1* 7.9*  PHOS 4.0 5.6* 4.5    Liver Function Tests: Recent Labs  Lab 06/21/21 0120 06/22/21 0044 06/25/21 0159 06/26/21 0231 06/27/21 0113  AST 29  --   --   --   --   ALT 5  --   --   --   --   ALKPHOS 120  --   --   --   --   BILITOT 0.7  --   --   --   --   PROT 6.6  --   --   --   --   ALBUMIN 2.2*   2.2*   < > 2.1* 2.0* 1.9*   < > = values in this interval not displayed.    No results for input(s): LIPASE, AMYLASE in the last 168 hours. CBC: Recent Labs  Lab 06/22/21 0044 06/23/21 0057 06/24/21 0056 06/25/21 0609 06/25/21 0927 06/25/21 1208 06/25/21 1409 06/25/21 2147 06/26/21 0231 06/27/21 0113  WBC 5.8 7.6 7.9   < > 6.0   --  5.8 7.7 7.9 6.2  NEUTROABS 4.4 5.9 6.1  --   --   --   --   --   --   --   HGB 7.3* 7.1* 6.6*   < > 7.3*  --  7.1* 8.3* 8.1* 7.8*  HCT 25.3* 24.3* 22.4*   < > 24.0*  --  24.4* 26.7* 27.2* 25.4*  MCV 97.7 98.0 97.8   < > 96.0  --  96.4 97.1 96.1 96.2  PLT 177 161 150   < > 126*   < > 134* 136* 131* 130*   < > = values in this interval not displayed.    Blood Culture    Component Value Date/Time   SDES BLOOD RIGHT ARM 06/15/2021 0919   SPECREQUEST  06/15/2021 0919    BOTTLES DRAWN AEROBIC AND ANAEROBIC Blood Culture adequate volume   CULT  06/15/2021 0919    NO GROWTH 5 DAYS Performed at [facility], [place]  [address] [place], [place] [address]    REPTSTATUS 06/20/2021 FINAL 06/15/2021 0919    Cardiac Enzymes: No results for input(s): CKTOTAL, CKMB, CKMBINDEX, TROPONINI in the last 168 hours. CBG: Recent Labs  Lab 06/25/21 2028 06/26/21 1212 06/26/21 1635 06/26/21 2009 06/27/21 0848  GLUCAP 176* 105* 165* 208* 134*    Iron Studies: No results for input(s): IRON, TIBC, TRANSFERRIN, FERRITIN in the last 72 hours. Lab Results  Component Value Date   INR 1.7 (H) 06/25/2021   INR 1.2 05/19/2019   INR 1.2 05/18/2019   Studies/Results: No results found.  Medications:   ceFAZolin (ANCEF) IV 2 g (06/26/21 1535)    amitriptyline  100 mg Oral QHS   apixaban  2.5 mg Oral BID   atorvastatin  80 mg Oral QPM   chlorhexidine  15 mL Mouth Rinse BID   Chlorhexidine Gluconate Cloth  6 each Topical Q0600   darbepoetin (ARANESP) injection - DIALYSIS  200 mcg Intravenous Q Wed-HD   insulin aspart  0-6 Units Subcutaneous TID WC   insulin glargine-yfgn  10 Units Subcutaneous QHS   levothyroxine  100 mcg Oral QAC breakfast   lidocaine-prilocaine  1 application Topical Q M,W,F   mouth rinse  15 mL Mouth Rinse q12n4p   metoprolol tartrate  12.5 mg Oral BID   midodrine  10 mg Oral TID WC   multivitamin with minerals  1 tablet Oral Daily   pantoprazole (PROTONIX) IV  40 mg  Intravenous Q12H   sertraline  100 mg Oral Daily    Dialysis Orders: [facility] MWF   - mircera 225 mcg every 2 weeks - last received 200 mcg on 12/14  - venofer 100 mg (ordered for 10 doses)  - no vdra   Assessment/Plan: AHRF. Volume overload. Pulm edema on admit. Continue volume removal with HD. Now below OP dry weight. Will have lower EDW at discharge  ~117kg.  Recent COVID pna - was on vent 1/05 to 1/08. ?aspiration PNA  ESRD - HD MWF.  Continue on schedule. HD 1/27 BP/ volume - Soft blood pressures this admission. [place] increased 10mg  TID. Volume improving, now below EDW.  Anemia ckd - Hgb 7.8 --s/p 2 u prbcs 1/23. Continue Aranesp 200 q Wed. Iron replete -s/p Ferrlecit 250mg  x4.  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 Cont auryxia ac.  Atrial Fibrillation w/ RVR - Rate controlled. On Eliquis, metoprolol  MSSA bacteremia. Prior to this admission. On cefazolin stop date 07/17/21.  New cavitary lesion in RUL on CT. Seen by PCCM and felt consistent with septic pulmonary embolus. Plan for 6 weeks Augmentin at discharge  Dispo: D/C to SNF when stable.   Richard Child PA-C [facility] 06/27/2021,11:34 AM

## 2021-06-27 NOTE — Progress Notes (Signed)
Occupational Therapy Treatment Patient Details Name: Richard Hall MRN: [id number] DOB: Jun 22, 1955 Today's Date: 06/27/2021   History of present illness 66 y.o. male with medical history significant of hypertension, atrial fibrillation, congestive heart failure, CVA, ESRD on HD, and prostate cancer. Recently d/c from hospital to SNF and readmitted with acute respiratory failure due to overload. On prior admission, found to have chronic non-healed Lt femur facture despite ORIF, will request NWB status LLE.   OT comments  Pt making progress with functional goals. Pt agreeable to OOB activity. Pt required mod A +2 to elevate trunk and with LEs off EOB, used pads underneath pt to assist rotating and scooting hips. Mod A +2 lateral scoot to drop arm chair with cues for sequencing and hand placement. Pt participated in grooming, oral hygiene and UB dressing tasks seated in chair. Lift sling left placed underneath pt for nursing staff to transfer him back to bed later. L UE edematous, positioned with pillow while seated in recliner. OT will continue to follow acutely to maximize level of function and safety   Recommendations for follow up therapy are one component of a multi-disciplinary discharge planning process, led by the attending physician.  Recommendations may be updated based on patient status, additional functional criteria and insurance authorization.    Follow Up Recommendations  Skilled nursing-short term rehab (<3 hours/day)    Assistance Recommended at Discharge    Patient can return home with the following  Two people to help with walking and/or transfers;Two people to help with bathing/dressing/bathroom   Equipment Recommendations  Other (comment) (TBD at SNF)    Recommendations for Other Services      Precautions / Restrictions Precautions Precautions: Fall Restrictions Weight Bearing Restrictions: Yes LLE Weight Bearing: Non weight bearing       Mobility Bed  Mobility Overal bed mobility: Needs Assistance Bed Mobility: Supine to Sit     Supine to sit: Mod assist, +2 for physical assistance     General bed mobility comments: mod A to elevate trunk and with LEs off EOB, used pads underneath pt to assist rotating and scooting hips. Lift sling underneath pt for nursing staff to transfer him back to bed    Transfers Overall transfer level: Needs assistance   Transfers: Bed to chair/wheelchair/BSC            Lateral/Scoot Transfers: Mod assist, +2 physical assistance, From elevated surface General transfer comment: verbal cues for sequencing, used pads underneath pt to assist rotating and scooting hips. Pt able to scoot self back in chair using UEs to push from armrests     Balance Overall balance assessment: Needs assistance Sitting-balance support: Feet supported, No upper extremity supported Sitting balance-Leahy Scale: Fair         Standing balance comment: Unable                           ADL either performed or assessed with clinical judgement   ADL Overall ADL's : Needs assistance/impaired     Grooming: Wash/dry hands;Wash/dry face;Brushing hair;Sitting;Min guard           Upper Body Dressing : Minimal assistance;Sitting       Toilet Transfer: Moderate assistance;+2 for physical assistance;Cueing for sequencing;Cueing for safety Toilet Transfer Details (indicate cue type and reason): simulated lateral scoot to drop arm chair Toileting- Clothing Manipulation and Hygiene: Total assistance;Bed level       Functional mobility during ADLs: Moderate assistance;+2 for physical assistance;Cueing  for safety;Cueing for sequencing General ADL Comments: pt limited by weakness and poor activity tolerance    Extremity/Trunk Assessment Upper Extremity Assessment Upper Extremity Assessment: LUE deficits/detail LUE Deficits / Details: L UE edematous, positioned with pillow while seated in recliner   Lower  Extremity Assessment Lower Extremity Assessment: Defer to PT evaluation        Vision Baseline Vision/History: 1 Wears glasses Ability to See in Adequate Light: 0 Adequate Patient Visual Report: No change from baseline     Perception     Praxis      Cognition Arousal/Alertness: Awake/alert Behavior During Therapy: Flat affect Overall Cognitive Status: No family/caregiver present to determine baseline cognitive functioning                         Following Commands: Follows one step commands with increased time Safety/Judgement: Decreased awareness of deficits, Decreased awareness of safety     General Comments: slow to respond        Exercises      Shoulder Instructions       General Comments      Pertinent Vitals/ Pain       Pain Assessment Pain Assessment: No/denies pain Pain Score: 0-No pain Pain Intervention(s): Monitored during session, Repositioned  Home Living                                          Prior Functioning/Environment              Frequency  Min 2X/week        Progress Toward Goals  OT Goals(current goals can now be found in the care plan section)  Progress towards OT goals: Progressing toward goals     Plan Discharge plan remains appropriate;Frequency remains appropriate    Co-evaluation    PT/OT/SLP Co-Evaluation/Treatment: Yes Reason for Co-Treatment: For patient/therapist safety;To address functional/ADL transfers   OT goals addressed during session: ADL's and self-care      AM-PAC OT "6 Clicks" Daily Activity     Outcome Measure   Help from another person eating meals?: None Help from another person taking care of personal grooming?: A Little Help from another person toileting, which includes using toliet, bedpan, or urinal?: Total Help from another person bathing (including washing, rinsing, drying)?: A Lot Help from another person to put on and taking off regular upper body  clothing?: A Little Help from another person to put on and taking off regular lower body clothing?: A Lot 6 Click Score: 15    End of Session    OT Visit Diagnosis: Muscle weakness (generalized) (M62.81);Other symptoms and signs involving cognitive function;Other abnormalities of gait and mobility (R26.89);Pain   Activity Tolerance Patient limited by fatigue   Patient Left with call bell/phone within reach;in chair;with chair alarm set   Nurse Communication Need for lift equipment;Mobility status        Time: 2694-8546 OT Time Calculation (min): 24 min  Charges: OT General Charges $OT Visit: 1 Visit OT Treatments $Self Care/Home Management : 8-22 mins  {  [name] 06/27/2021, 3:39 PM

## 2021-06-28 DIAGNOSIS — D649 Anemia, unspecified: Secondary | ICD-10-CM

## 2021-06-28 DIAGNOSIS — Z7189 Other specified counseling: Secondary | ICD-10-CM

## 2021-06-28 DIAGNOSIS — Z515 Encounter for palliative care: Secondary | ICD-10-CM

## 2021-06-28 LAB — RENAL FUNCTION PANEL
Albumin: 1.8 g/dL — ABNORMAL LOW (ref 3.5–5.0)
Anion gap: 10 (ref 5–15)
BUN: 46 mg/dL — ABNORMAL HIGH (ref 8–23)
CO2: 27 mmol/L (ref 22–32)
Calcium: 8 mg/dL — ABNORMAL LOW (ref 8.9–10.3)
Chloride: 90 mmol/L — ABNORMAL LOW (ref 98–111)
Creatinine, Ser: 5.54 mg/dL — ABNORMAL HIGH (ref 0.61–1.24)
GFR, Estimated: 11 mL/min — ABNORMAL LOW (ref >60)
Glucose, Bld: 154 mg/dL — ABNORMAL HIGH (ref 70–99)
Phosphorus: 4.9 mg/dL — ABNORMAL HIGH (ref 2.5–4.6)
Potassium: 4.5 mmol/L (ref 3.5–5.1)
Sodium: 127 mmol/L — ABNORMAL LOW (ref 135–145)

## 2021-06-28 LAB — GLUCOSE, CAPILLARY
Glucose-Capillary: 132 mg/dL — ABNORMAL HIGH (ref 70–99)
Glucose-Capillary: 124 mg/dL — ABNORMAL HIGH (ref 70–99)
Glucose-Capillary: 164 mg/dL — ABNORMAL HIGH (ref 70–99)

## 2021-06-28 LAB — CBC
HCT: 23.8 % — ABNORMAL LOW (ref 39.0–52.0)
Hemoglobin: 7.3 g/dL — ABNORMAL LOW (ref 13.0–17.0)
MCH: 29.4 pg (ref 26.0–34.0)
MCHC: 30.7 g/dL (ref 30.0–36.0)
MCV: 96 fL (ref 80.0–100.0)
Platelets: 141 10*3/uL — ABNORMAL LOW (ref 150–400)
RBC: 2.48 MIL/uL — ABNORMAL LOW (ref 4.22–5.81)
RDW: 20.3 % — ABNORMAL HIGH (ref 11.5–15.5)
WBC: 6.6 10*3/uL (ref 4.0–10.5)
nRBC: 0.6 % — ABNORMAL HIGH (ref 0.0–0.2)

## 2021-06-28 LAB — PREPARE RBC (CROSSMATCH)

## 2021-06-28 MED ORDER — ALTEPLASE 2 MG IJ SOLR: 2.0000 mg | Freq: Once | INTRAMUSCULAR | Status: DC | PRN

## 2021-06-28 MED ORDER — BISACODYL 5 MG PO TBEC
10.0000 mg | DELAYED_RELEASE_TABLET | Freq: Once | ORAL | Status: AC
  Administered 2021-06-28: 10 mg via ORAL

## 2021-06-28 MED ORDER — LACTULOSE 10 GM/15ML PO SOLN
30.0000 g | Freq: Once | ORAL | Status: AC
  Administered 2021-06-28: 30 g via ORAL
  Filled 2021-06-28: qty 45

## 2021-06-28 MED ORDER — BISACODYL 5 MG PO TBEC
10.0000 mg | DELAYED_RELEASE_TABLET | Freq: Once | ORAL | Status: DC
  Filled 2021-06-28: qty 2

## 2021-06-28 MED ORDER — LIDOCAINE-PRILOCAINE 2.5-2.5 % EX CREA
1.0000 "application " | TOPICAL_CREAM | CUTANEOUS | Status: DC | PRN
  Filled 2021-06-28: qty 5

## 2021-06-28 MED ORDER — SODIUM CHLORIDE 0.9 % IV SOLN: 100.0000 mL | INTRAVENOUS | Status: DC | PRN

## 2021-06-28 MED ORDER — HEPARIN SODIUM (PORCINE) 1000 UNIT/ML DIALYSIS: 1000.0000 [IU] | INTRAMUSCULAR | Status: DC | PRN

## 2021-06-28 MED ORDER — POLYETHYLENE GLYCOL 3350 17 G PO PACK: 34.0000 g | PACK | Freq: Every day | ORAL | Status: DC

## 2021-06-28 MED ORDER — LIDOCAINE HCL (PF) 1 % IJ SOLN
5.0000 mL | INTRAMUSCULAR | Status: DC | PRN
  Filled 2021-06-28: qty 5

## 2021-06-28 MED ORDER — PENTAFLUOROPROP-TETRAFLUOROETH EX AERO: 1.0000 "application " | INHALATION_SPRAY | CUTANEOUS | Status: DC | PRN

## 2021-06-28 MED ORDER — SODIUM CHLORIDE 0.9% IV SOLUTION: Freq: Once | INTRAVENOUS | Status: DC

## 2021-06-28 MED ORDER — POLYETHYLENE GLYCOL 3350 17 G PO PACK
34.0000 g | PACK | Freq: Once | ORAL | Status: AC
  Administered 2021-06-28: 34 g via ORAL
  Filled 2021-06-28: qty 2

## 2021-06-28 NOTE — Progress Notes (Signed)
Speech Language Pathology Treatment: Dysphagia  Patient Details Name: Richard Hall MRN: [id number] DOB: Oct 09, 1955 Today's Date: 06/28/2021 Time: 8882-8003 SLP Time Calculation (min) (ACUTE ONLY): 8 min  Assessment / Plan / Recommendation Clinical Impression  He recalled MBS yesterday but unable to recall education provided. Therapist reviewed results of suspected esophageal dysphagia with apparent slow bolus transit, hesitation and reflux. Mildly prolonged mastication but functional and pt wishes to remain on regular texture. No concerns re: pharyngeal swallow with regular or thin. Therapist reviewed esophageal precautions and recommendation of GI consult after discharge if symptoms worsen. No follow up needed.     HPI HPI: 66 y.o. male with medical history significant of hypertension, atrial fibrillation, congestive heart failure, CVA, ESRD on HD, and prostate cancer. Recently d/c (admitted 12/20-1/13)from hospital to SNF and readmitted with acute respiratory failure due to overload. Patient had been seen by ST services during previous admission and at that time was tolerating a Dys 2, thin liquids diet; ST continuing to f/u for diet progression.      SLP Plan  All goals met;Discharge SLP treatment due to (comment)      Recommendations for follow up therapy are one component of a multi-disciplinary discharge planning process, led by the attending physician.  Recommendations may be updated based on patient status, additional functional criteria and insurance authorization.    Recommendations  Diet recommendations: Regular;Thin liquid Liquids provided via: Cup;Straw Medication Administration: Whole meds with puree Supervision: Patient able to self feed Compensations: Slow rate;Small sips/bites Postural Changes and/or Swallow Maneuvers: Seated upright 90 degrees;Upright 30-60 min after meal                Oral Care Recommendations: Oral care BID Follow Up Recommendations: No SLP  follow up Assistance recommended at discharge: None SLP Visit Diagnosis: Dysphagia, unspecified (R13.10) Plan: All goals met;Discharge SLP treatment due to (comment)           Houston Siren  06/28/2021, 10:02 AM Orbie Pyo Colvin Caroli.Ed Psychologist, educational preferred  Office [phone]

## 2021-06-28 NOTE — Progress Notes (Signed)
PROGRESS NOTE                                                                                                                                                                                                             Patient Demographics:    Richard Hall, is a 66 y.o. male, DOB - 01-Sep-1955, NGE:[id number]  Outpatient Primary MD for the patient is [name], MD    LOS - 13  Admit date - 06/15/2021    Chief Complaint  Patient presents with   Shortness of Breath       Brief Narrative (HPI from H&P)      Richard Hall is a 66 y.o. male with medical history significant of hypertension, atrial fibrillation, congestive heart failure, CVA, ESRD on HD, and prostate cancer presents with complaints of shortness of breath.  Patient had just recently been hospitalized from 12/30-1/13 after initially presenting with septic shock related to MSSA bacteremia and COVID-19 viral pneumonia.  During his hospital stay patient required intubation.  ID had been consulted and patient was recommended to complete 6 weeks of cefazolin to be completed on 07/17/2021.  He went to the rehab facility but soon was sent back to the hospital for hypoxia, in the ER his work-up was suggestive of possible continued pneumonia, troponin, cardiology was consulted and he was admitted for further care.   Subjective:   No significant events overnight, patient reported generalized weakness, and poor appetite, no BM over last 3 days.     Assessment  & Plan :   Acute Hypoxic Resp. Failure  - due to Acute on chronic diastolic CHF in the presence of severe MR, EF 60% on recent echo with NSTEMI in a patient with ESRD and recent COVID-19 pneumonia with a new cavitary lesion on CT scan ?  Chronic intermittent microaspiration   Acute on chronic diastolic CHF -Volume management with hemodialysis.  cavitary pneumonia - no sepsis this admission, however does have  leukocytosis with a cavitary lesion on CT scan raising the suspicion for possible underlying aspiration, SLP following, also continue fluid removal through HD, renal and cardiology on board will also request pulmonary to opine on his cavitary lung lesion. CRP and procalcitonin also point towards a bacterial infectious etiology as well.  He was briefly placed on Unasyn with excellent clinical improvement, cultures here  this admission negative thus far.  Have been consulted ID discussed the plan with pulmonary on 06/20/2021 plan is to continue cefazolin till 07/17/2021 thereafter outpatient ID and pulmonary follow-up.   - Encouraged to sit up in chair in the daytime use I-S and flutter valve for pulmonary toiletry.  Will advance activity and titrate down oxygen as possible.   - He is on cefazolin for recent MSSA bacteremia with stop date of 07/17/2021 (pharm confirmed)   Prostate cancer with metastasis to the bone -He is being followed by Dr. Tresa Moore from urology, Dr. Alen Blew from oncology   ESRD.   - Renal on board for fluid removal through HD.  Recent MSSA bacteremia.   - Cefazolin with a stop date of 07/17/2021 seen by ID this admission.  Dysphagia.   - Speech therapy following currently on dysphagia 2 diet.  Kindly see #1 above.  AOCD.  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   -Patient with anemia, required 3 units PRBC this hospital stay. -Concern for chronic blood loss anemia, GI consulted, patient is high risk for procedures. -Resumed on Eliquis today at 2.5 mg p.o. twice daily, monitor CBC closely -On Aranesp and IV iron per renal -Hemoglobin trending down at 7.3 today, he will receive 1 unit PRBC during dialysis  today. -Patient with profound anemia, requiring multiple transfusions, despite being on Aranesp and IV iron per renal, eulogy can be multifactorial, may be related to mild chronic blood loss anemia, but as well patient with prostate cancer and bone metastasis which is  contributing as well.  Obesity with OSA. -  BMI 35 follow with PCP for weight loss nightly CPAP.  Counseled on compliance  Recent left ankle fracture.  -  Nonweightbearing left leg per last admission, outpatient orthopedics follow-up.  PT OT.  Hypothyroidism.   -On Synthroid   Chronic A. fib [place] vas 2 score of greater than 4.   - On Eliquis continue low-dose beta-blocker as tolerated by blood pressure.    Hypotension.   -Blood pressure has stabilized and improving titrate off midodrine.  Dyslipidemia.   -On Statin  Hyponatremia - management with dialysis.  History of prostate cancer - Continue Xtandi.  Elevated troponin.   - Likely demand ischemia in the setting of ESRD, demand mismatch caused by hypoxia from #1 above, chest pain-free.  Cardiology on board, on Eliquis, statin, low-dose beta-blocker as tolerated by blood pressure and monitor  Severe MR.  -  Outpatient cardiology follow-up.  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  History of COVID-19 infection in the past, still COVID-19 positive.  Repeat test on 06/25/2021 is still positive but cycle threshold is 39  i.e. he is not contagious and this is not an active infection.    DM type II.  -  On Lantus and sliding scale.  Monitor and adjust  Lab Results  Component Value Date   HGBA1C 6.1 (H) 06/01/2021    CBG (last 3)  Recent Labs    06/27/21 1558 06/27/21 2035 06/28/21 0804  GLUCAP 169* 183* 132*      Goals of care -Patient with very poor functional status, weak, deconditioned, HD dependent, multiple comorbidities, and with metastatic prostate cancer, palliative medicine has been consulted to address goals of  care.      Condition -  Extremely Guarded  Family Communication  : None at bedside  Code Status : DNR  Consults  : Cardiology, Pulmonary, renal, Palliative   PUD Prophylaxis :    Procedures  :     CT Scan abdomen pelvis repeat on 06/25/2021 without contrast.  No evidence of retroperitoneal bleed.  CT scan - 1. The appearance of the chest suggest congestive heart failure, as above. 2. In addition, there is a new thick-walled cavitary nodule measuring 1.4 x 1.2 cm in the right upper lobe. This is potentially infectious or inflammatory in etiology, however, underlying neoplasm is not excluded. Short-term follow-up repeat noncontrast chest CT is recommended in 2-3 months to ensure the stability or regression of this lesion. 3. Mildly enlarged right paratracheal lymph node, nonspecific in the setting of presumed congestive heart failure, but concerning in light of the right upper lobe pulmonary nodule. Attention at time of repeat CT examination is recommended to ensure stability/regression. 4. Diffuse sclerotic lesions noted throughout the visualized axial and appendicular skeleton, presumably reflective of widespread osseous metastasis in this patient with history of prostate cancer. 5. Aortic atherosclerosis, in addition to left main and three-vessel coronary artery disease. Please note that although the presence of coronary artery calcium documents the presence of coronary artery disease, the severity of this disease and any potential stenosis cannot be assessed on this non-gated CT examination. Assessment for potential risk factor modification, dietary therapy or pharmacologic therapy may be warranted, if clinically indicated. 6. There are calcifications of the aortic valve. Echocardiographic correlation for evaluation of potential valvular dysfunction may be warranted if clinically indicated. Aortic Atherosclerosis (ICD10-I70.0).      Disposition Plan  :    Status is: Inpatient  Remains inpatient appropriate because:  Acute hypoxic respiratory failure  DVT Prophylaxis  :    apixaban (ELIQUIS) tablet 2.5 mg Start: 06/27/21 1000 apixaban (ELIQUIS) tablet 2.5 mg     Lab Results  Component Value Date   PLT 141 (L) 06/28/2021    Diet :  Diet Order             Diet renal with fluid restriction Fluid restriction: 1200 mL Fluid; Room service appropriate? Yes; Fluid consistency: Thin  Diet effective now                    Inpatient Medications  Scheduled Meds:  sodium chloride   Intravenous Once   amitriptyline  100 mg Oral QHS   apixaban  2.5 mg Oral BID   atorvastatin  80 mg Oral QPM   bisacodyl  10 mg Oral Once   bisacodyl  10 mg Oral Once   chlorhexidine  15 mL Mouth Rinse BID   Chlorhexidine Gluconate Cloth  6 each Topical Q0600   darbepoetin (ARANESP) injection - DIALYSIS  200 mcg Intravenous Q Wed-HD   insulin aspart  0-6 Units Subcutaneous TID WC   insulin glargine-yfgn  10 Units Subcutaneous QHS   lactulose  30 g Oral Once   levothyroxine  100 mcg Oral QAC breakfast   lidocaine-prilocaine  1 application Topical Q M,W,F   mouth rinse  15 mL Mouth Rinse q12n4p   metoprolol tartrate  12.5 mg Oral BID   midodrine  10 mg Oral TID WC   multivitamin with minerals  1 tablet Oral Daily   pantoprazole (PROTONIX) IV  40 mg Intravenous Q12H   polyethylene glycol  34 g Oral Once   polyethylene glycol  34  g Oral Daily   senna-docusate  2 tablet Oral BID   sertraline  100 mg Oral Daily   Continuous Infusions:  [START ON 06/29/2021] sodium chloride     [START ON 06/29/2021] sodium chloride      ceFAZolin (ANCEF) IV 2 g (06/26/21 1535)   PRN Meds:.[START ON 06/29/2021] sodium chloride, [START ON 06/29/2021] sodium chloride, acetaminophen **OR** acetaminophen, albuterol, [START ON 06/29/2021] alteplase, calcium carbonate, diphenhydrAMINE, diphenoxylate-atropine, guaiFENesin, [START ON 06/29/2021] heparin, [START ON 06/29/2021] lidocaine (PF), [START ON 06/29/2021] lidocaine-prilocaine, ondansetron  (ZOFRAN) IV, [START ON 06/29/2021] pentafluoroprop-tetrafluoroeth      Phillips Climes M.D on 06/28/2021 at 3:37 PM  To page go to [email or web address]   [facility] -  Office  [phone]  See all Orders from today for further details    Objective:   Vitals:   06/28/21 1330 06/28/21 1400 06/28/21 1430 06/28/21 1520  BP: (!) 104/57 (!) 104/57 (!) 102/53 (!) 92/55  Pulse: 75 60 64 61  Resp: 18 17  20   Temp:    (!) 97.5 F (36.4 C)  TempSrc:    Temporal  SpO2:      Weight:      Height:        Wt Readings from Last 3 Encounters:  06/27/21 121.4 kg  06/14/21 117.6 kg  07/26/20 125.3 kg     Intake/Output Summary (Last 24 hours) at 06/28/2021 1537 Last data filed at 06/28/2021 1520 Gross per 24 hour  Intake 1015 ml  Output 3000 ml  Net -1985 ml       Physical Exam  Awake Alert, frail, appears much older than stated age, deconditioned.   Symmetrical Chest wall movement, Good air movement bilaterally, CTAB RRR,No Gallops,Rubs or new Murmurs, No Parasternal Heave +ve B.Sounds, Abd Soft, No tenderness, No rebound - guarding or rigidity. No Cyanosis, Clubbing or edema, No new Rash or bruise       RN pressure injury documentation: Pressure Injury 06/11/21 Coccyx Medial Stage 2 -  Partial thickness loss of dermis presenting as a shallow open injury with a red, pink wound bed without slough. (Active)  06/11/21 2159  Location: Coccyx  Location Orientation: Medial  Staging: Stage 2 -  Partial thickness loss of dermis presenting as a shallow open injury with a red, pink wound bed without slough.  Wound Description (Comments):   Present on Admission:      Data Review:   Recent Labs  Lab 06/22/21 0044 06/23/21 0057 06/24/21 0056 06/25/21 0609 06/25/21 1409 06/25/21 2147 06/26/21 0231 06/27/21 0113 06/28/21 0112  WBC 5.8 7.6 7.9   < > 5.8 7.7 7.9 6.2 6.6  HGB 7.3* 7.1* 6.6*   < > 7.1* 8.3* 8.1* 7.8* 7.3*  HCT 25.3* 24.3* 22.4*   < > 24.4* 26.7* 27.2*  25.4* 23.8*  PLT 177 161 150   < > 134* 136* 131* 130* 141*  MCV 97.7 98.0 97.8   < > 96.4 97.1 96.1 96.2 96.0  MCH 28.2 28.6 28.8   < > 28.1 30.2 28.6 29.5 29.4  MCHC 28.9* 29.2* 29.5*   < > 29.1* 31.1 29.8* 30.7 30.7  RDW 19.2* 20.1* 21.0*   < > 21.2* 20.6* 20.6* 20.8* 20.3*  LYMPHSABS 0.7 0.7 0.7  --   --   --   --   --   --   MONOABS 0.5 0.7 0.9  --   --   --   --   --   --   EOSABS 0.1 0.1 0.1  --   --   --   --   --   --  BASOSABS 0.0 0.0 0.0  --   --   --   --   --   --    < > = values in this interval not displayed.    Recent Labs  Lab 06/22/21 0044 06/23/21 0057 06/24/21 0056 06/25/21 0159 06/25/21 1208 06/26/21 0231 06/27/21 0113 06/28/21 0112  NA 131* 133* 127* 131*  --  125* 129* 127*  K 3.8 3.8 3.8 4.1  --  4.6 4.4 4.5  CL 95* 94* 90* 94*  --  89* 93* 90*  CO2 28 29 25 29   --  25 28 27   GLUCOSE 91 205* 176* 137*  --  167* 154* 154*  BUN 21 37* 56* 29*  --  40* 30* 46*  CREATININE 3.66* 4.86* 6.14* 4.18*  --  5.40* 4.17* 5.54*  CALCIUM 8.0* 8.1* 8.0* 8.1*  --  8.1* 7.9* 8.0*  ALBUMIN 2.0* 2.0* 2.1* 2.1*  --  2.0* 1.9* 1.8*  CRP 5.1* 5.9*  --   --   --   --   --   --   DDIMER  --   --   --   --  1.29*  --   --   --   INR  --   --   --   --  1.7*  --   --   --   BNP 1,418.4* 1,284.6*  --   --   --   --   --   --      Radiology Reports CT ABDOMEN PELVIS WO CONTRAST  Result Date: 06/25/2021 CLINICAL DATA:  Coronavirus pneumonia. Anemia. Anemia is worsening. EXAM: CT ABDOMEN AND PELVIS WITHOUT CONTRAST TECHNIQUE: Multidetector CT imaging of the abdomen and pelvis was performed following the standard protocol without IV contrast. RADIATION DOSE REDUCTION: This exam was performed according to the departmental dose-optimization program which includes automated exposure control, adjustment of the mA and/or kV according to patient size and/or use of iterative reconstruction technique. COMPARISON:  Chest CT 06/16/2021.  CT pelvis 05/19/2019 FINDINGS: Lower chest: Moderate  bilateral effusions layering dependently with dependent atelectasis. Non dependent lung appears largely clear. The heart is enlarged. There is a small amount pericardial fluid. Hepatobiliary: Liver parenchyma appears normal without contrast. No calcified gallstones. Pancreas: Normal Spleen: Normal Adrenals/Urinary Tract: Adrenal glands are normal. The kidneys are atrophic. Simple cyst on both sides. No hydronephrosis. No visible bladder abnormality. Stomach/Bowel: Stomach and small intestine are normal. No visible colon pathology. Vascular/Lymphatic: Aortic atherosclerosis. No aneurysm. IVC is normal. No adenopathy. No retroperitoneal hematoma. Reproductive: Normal Other: No free fluid or air.  No evidence of body wall hemorrhage. Musculoskeletal: Diffusely abnormal appearance of the bones, with a mixed lytic and sclerotic pattern most often associated with widespread malignancy in this patient with a history of metastatic prostate cancer. No evidence of dominant bone lesion. No definite evidence of progression when compared to a CT scan of May 19, 2019. IMPRESSION: No hemorrhage or hematoma seen to explain worsening anemia. Bilateral pleural effusions layering dependently with dependent pulmonary atelectasis. Cardiomegaly. Aortic Atherosclerosis (ICD10-I70.0). Bilateral renal atrophy. 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 No visible progression since a study of 2020. Electronically Signed   By: Nelson Chimes M.D.   On: 06/25/2021 08:54   DG Swallowing Func-Speech Pathology  Result Date: 06/27/2021 Table formatting from the original result was not included. Objective Swallowing Evaluation: Type of Study: MBS-Modified Barium Swallow Study  Patient Details Name: Pinkney Venard  MRN: [id number] Date of Birth: 1956/02/02 Today's Date: 06/27/2021 Time: SLP Start Time (ACUTE ONLY): 1200 -SLP Stop Time (ACUTE ONLY): 1215 SLP Time Calculation  (min) (ACUTE ONLY): 15 min Past Medical History: Past Medical History: Diagnosis Date  A-V fistula ([place])   left arm  Anemia   Arthritis   CHF (congestive heart failure) (H[facility])   CVA (cerebral vascular accident) ([place]) 2010  Diabetes mellitus with end-stage renal disease ([place])   Dyalisis pt.  Diabetic neuropathy (H[facility])   Bilateral feet  Dyslipidemia   Dysrhythmia   A-fib  Essential hypertension   Hepatitis   childhood  History of blood transfusion   Hypothyroidism (acquired)   OSA on CPAP   Prostate cancer [facility])   Uses wheelchair  Past Surgical History: Past Surgical History: Procedure Laterality Date  APPENDECTOMY    COLONOSCOPY    FEMUR IM NAIL Left 05/20/2019  Procedure: Intramedullary (Im) Retrograde Femoral Nailing for impending pathologic fracture;  Surgeon: Altamese [place], MD;  Location: [place];  Service: Orthopedics;  Laterality: Left;  ORIF FEMUR FRACTURE Left 05/20/2019  Procedure: OPEN REDUCTION INTERNAL FIXATION (ORIF) DISTAL FEMUR FRACTURE with intercondylar extension;  Surgeon: Altamese [place], MD;  Location: [place];  Service: Orthopedics;  Laterality: Left;  TRANSURETHRAL RESECTION OF BLADDER TUMOR N/A 01/07/2019  Procedure: CYSTOSCOPY/ EVEACUATION CLOT;  Surgeon: Bjorn Loser, MD;  Location: [facility];  Service: Urology;  Laterality: N/A;  TRANSURETHRAL RESECTION OF PROSTATE N/A 07/26/2020  Procedure: TRANSURETHRAL RESECTION OF THE PROSTATE (TURP);  Surgeon: Alexis Frock, MD;  Location: [facility];  Service: Urology;  Laterality: N/A;  37 MINS HPI: 66 y.o. male with medical history significant of hypertension, atrial fibrillation, congestive heart failure, CVA, ESRD on HD, and prostate cancer. Recently d/c (admitted 12/20-1/13)from hospital to SNF and readmitted with acute respiratory failure due to overload. Patient had been seen by ST services during previous admission and at that time was tolerating a Dys 2, thin liquids diet; ST continuing to f/u for diet progression.  Subjective: awake and alert but  appears confused and apathetic  Recommendations for follow up therapy are one component of a multi-disciplinary discharge planning process, led by the attending physician.  Recommendations may be updated based on patient status, additional functional criteria and insurance authorization. Assessment / Plan / Recommendation Clinical Impressions 06/27/2021 Clinical Impression Pt exhibits a suspected primary esophageal dysphagia. Transit time with solid affected by missing dentition rather than impairment. Thin liquid was penetrated and remained in vestibule x 1 and minimal vallecular residue both of which are normal and typical findings. His esophagus was scanned revealing hesitation and slow transit with pill with statis ascending esophagus. Compensatory strategies were discussed with pt to mitigate risks. Recommend he continue Dys 3, thin liquid, straws allowed, single pill with thin and remain upright 45 min after meals. Will follow up for esophageal education x 1. SLP Visit Diagnosis (No Data) Attention and concentration deficit following -- Frontal lobe and executive function deficit following -- Impact on safety and function Mild aspiration risk   Treatment Recommendations 06/27/2021 Treatment Recommendations Therapy as outlined in treatment plan below   Prognosis 06/27/2021 Prognosis for Safe Diet Advancement Good Barriers to Reach Goals -- Barriers/Prognosis Comment -- Diet Recommendations 06/27/2021 SLP Diet Recommendations Dysphagia 3 (Mech soft) solids;Thin liquid Liquid Administration via Cup;Straw Medication Administration Whole meds with liquid Compensations Slow rate;Small sips/bites Postural Changes Seated upright at 90 degrees;Remain semi-upright after after feeds/meals (Comment)   Other Recommendations 06/27/2021 Recommended Consults Consider esophageal assessment Oral Care Recommendations Oral care BID  Other Recommendations -- Follow Up Recommendations No SLP follow up Assistance recommended at discharge  None Functional Status Assessment -- Frequency and Duration  06/27/2021 Speech Therapy Frequency (ACUTE ONLY) min 1 x/week Treatment Duration 1 week   Oral Phase 06/27/2021 Oral Phase WFL Oral - Pudding Teaspoon -- Oral - Pudding Cup -- Oral - Honey Teaspoon -- Oral - Honey Cup -- Oral - Nectar Teaspoon -- Oral - Nectar Cup -- Oral - Nectar Straw -- Oral - Thin Teaspoon -- Oral - Thin Cup -- Oral - Thin Straw -- Oral - Puree -- Oral - Mech Soft -- Oral - Regular -- Oral - Multi-Consistency -- Oral - Pill -- Oral Phase - Comment --  Pharyngeal Phase 06/27/2021 Pharyngeal Phase WFL Pharyngeal- Pudding Teaspoon -- Pharyngeal -- Pharyngeal- Pudding Cup -- Pharyngeal -- Pharyngeal- Honey Teaspoon -- Pharyngeal -- Pharyngeal- Honey Cup -- Pharyngeal -- Pharyngeal- Nectar Teaspoon -- Pharyngeal -- Pharyngeal- Nectar Cup -- Pharyngeal -- Pharyngeal- Nectar Straw -- Pharyngeal -- Pharyngeal- Thin Teaspoon -- Pharyngeal -- Pharyngeal- Thin Cup -- Pharyngeal -- Pharyngeal- Thin Straw -- Pharyngeal -- Pharyngeal- Puree -- Pharyngeal -- Pharyngeal- Mechanical Soft -- Pharyngeal -- Pharyngeal- Regular -- Pharyngeal -- Pharyngeal- Multi-consistency -- Pharyngeal -- Pharyngeal- Pill -- Pharyngeal -- Pharyngeal Comment --  Cervical Esophageal Phase  06/27/2021 Cervical Esophageal Phase WFL Pudding Teaspoon -- Pudding Cup -- Honey Teaspoon -- Honey Cup -- Nectar Teaspoon -- Nectar Cup -- Nectar Straw -- Thin Teaspoon -- Thin Cup -- Thin Straw -- Puree -- Mechanical Soft -- Regular -- Multi-consistency -- Pill -- Cervical Esophageal Comment -- [place] Siren 06/27/2021, 1:11 PM                       Patient ID: [name], male   DOB: 01-04-1956, 66 y.o.   MRN: [id number] Patient ID: Colonel Krauser, male   DOB: 1955-07-06, 66 y.o.   MRN: [id number] Patient ID: Gleen Ripberger, male   DOB: 18-Feb-1956, 66 y.o.   MRN: [id number]

## 2021-06-28 NOTE — Plan of Care (Signed)
°  Problem: Clinical Measurements: Goal: Will remain free from infection Outcome: Progressing   Problem: Education: Goal: Knowledge of General Education information will improve Description: Including pain rating scale, medication(s)/side effects and non-pharmacologic comfort measures Outcome: Not Progressing   Problem: Activity: Goal: Risk for activity intolerance will decrease Outcome: Not Progressing

## 2021-06-28 NOTE — Progress Notes (Addendum)
[facility] Progress Note   Subjective: Seen on HD. No C/Os.  UFG 4 Liters  Objective Vitals:   06/27/21 2000 06/27/21 2321 06/28/21 0400 06/28/21 0911  BP: 109/69 123/77 102/65 114/69  Pulse: 64 67 61 66  Resp: 19  18 20   Temp: (!) 96.8 F (36 C)  98.1 F (36.7 C) (!) 97.3 F (36.3 C)  TempSrc: Oral Axillary Axillary Oral  SpO2:   100% 100%  Weight:      Height:       Physical Exam General: Chronically ill appearing male in NAD Heart: S1,S2 RRR Lungs: CTAB Anteriorly Abdomen: obese NABS Extremities: 1+ BLE edema Dialysis Access: L AVF + T/B   Additional Objective Labs: Basic Metabolic Panel: Recent Labs  Lab 06/26/21 0231 06/27/21 0113 06/28/21 0112  NA 125* 129* 127*  K 4.6 4.4 4.5  CL 89* 93* 90*  CO2 25 28 27   GLUCOSE 167* 154* 154*  BUN 40* 30* 46*  CREATININE 5.40* 4.17* 5.54*  CALCIUM 8.1* 7.9* 8.0*  PHOS 5.6* 4.5 4.9*   Liver Function Tests: Recent Labs  Lab 06/26/21 0231 06/27/21 0113 06/28/21 0112  ALBUMIN 2.0* 1.9* 1.8*   No results for input(s): LIPASE, AMYLASE in the last 168 hours. CBC: Recent Labs  Lab 06/22/21 0044 06/23/21 0057 06/24/21 0056 06/25/21 0609 06/25/21 1409 06/25/21 2147 06/26/21 0231 06/27/21 0113 06/28/21 0112  WBC 5.8 7.6 7.9   < > 5.8 7.7 7.9 6.2 6.6  NEUTROABS 4.4 5.9 6.1  --   --   --   --   --   --   HGB 7.3* 7.1* 6.6*   < > 7.1* 8.3* 8.1* 7.8* 7.3*  HCT 25.3* 24.3* 22.4*   < > 24.4* 26.7* 27.2* 25.4* 23.8*  MCV 97.7 98.0 97.8   < > 96.4 97.1 96.1 96.2 96.0  PLT 177 161 150   < > 134* 136* 131* 130* 141*   < > = values in this interval not displayed.   Blood Culture    Component Value Date/Time   SDES BLOOD RIGHT ARM 06/15/2021 0919   SPECREQUEST  06/15/2021 0919    BOTTLES DRAWN AEROBIC AND ANAEROBIC Blood Culture adequate volume   CULT  06/15/2021 0919    NO GROWTH 5 DAYS Performed at [facility], [place] [address], [place], [place] [address]    REPTSTATUS 06/20/2021 FINAL  06/15/2021 0919    Cardiac Enzymes: No results for input(s): CKTOTAL, CKMB, CKMBINDEX, TROPONINI in the last 168 hours. CBG: Recent Labs  Lab 06/27/21 0848 06/27/21 1253 06/27/21 1558 06/27/21 2035 06/28/21 0804  GLUCAP 134* 180* 169* 183* 132*   Iron Studies: No results for input(s): IRON, TIBC, TRANSFERRIN, FERRITIN in the last 72 hours. @lablastinr3 @ Studies/Results: DG Swallowing Func-Speech Pathology  Result Date: 06/27/2021 Table formatting from the original result was not included. Objective Swallowing Evaluation: Type of Study: MBS-Modified Barium Swallow Study  Patient Details Name: Richard Hall MRN: [id number] Date of Birth: 02-20-1956 Today's Date: 06/27/2021 Time: SLP Start Time (ACUTE ONLY): 1200 -SLP Stop Time (ACUTE ONLY): 5093 SLP Time Calculation (min) (ACUTE ONLY): 15 min Past Medical History: Past Medical History: Diagnosis Date  A-V fistula ([place])   left arm  Anemia   Arthritis   CHF (congestive heart failure) ([place])   CVA (cerebral vascular accident) ([place]) 2010  Diabetes mellitus with end-stage renal disease ([place])   Dyalisis pt.  Diabetic neuropathy ([facility])   Bilateral feet  Dyslipidemia   Dysrhythmia   A-fib  Essential hypertension   Hepatitis   childhood  History of blood transfusion   Hypothyroidism (acquired)   OSA on CPAP   Prostate cancer [facility])   Uses wheelchair  Past Surgical History: Past Surgical History: Procedure Laterality Date  APPENDECTOMY    COLONOSCOPY    FEMUR IM NAIL Left 05/20/2019  Procedure: Intramedullary (Im) Retrograde Femoral Nailing for impending pathologic fracture;  Surgeon: Altamese [place] Neighbors, MD;  Location: [facility];  Service: Orthopedics;  Laterality: Left;  ORIF FEMUR FRACTURE Left 05/20/2019  Procedure: OPEN REDUCTION INTERNAL FIXATION (ORIF) DISTAL FEMUR FRACTURE with intercondylar extension;  Surgeon: Altamese Cherokee, MD;  Location: [facility];  Service: Orthopedics;  Laterality: Left;  TRANSURETHRAL RESECTION OF BLADDER TUMOR N/A 01/07/2019  Procedure:  CYSTOSCOPY/ EVEACUATION CLOT;  Surgeon: Bjorn Loser, MD;  Location: [facility];  Service: Urology;  Laterality: N/A;  TRANSURETHRAL RESECTION OF PROSTATE N/A 07/26/2020  Procedure: TRANSURETHRAL RESECTION OF THE PROSTATE (TURP);  Surgeon: Alexis Frock, MD;  Location: [facility];  Service: Urology;  Laterality: N/A;  40 MINS HPI: 66 y.o. male with medical history significant of hypertension, atrial fibrillation, congestive heart failure, CVA, ESRD on HD, and prostate cancer. Recently d/c (admitted 12/20-1/13)from hospital to SNF and readmitted with acute respiratory failure due to overload. Patient had been seen by ST services during previous admission and at that time was tolerating a Dys 2, thin liquids diet; ST continuing to f/u for diet progression.  Subjective: awake and alert but appears confused and apathetic  Recommendations for follow up therapy are one component of a multi-disciplinary discharge planning process, led by the attending physician.  Recommendations may be updated based on patient status, additional functional criteria and insurance authorization. Assessment / Plan / Recommendation Clinical Impressions 06/27/2021 Clinical Impression Pt exhibits a suspected primary esophageal dysphagia. Transit time with solid affected by missing dentition rather than impairment. Thin liquid was penetrated and remained in vestibule x 1 and minimal vallecular residue both of which are normal and typical findings. His esophagus was scanned revealing hesitation and slow transit with pill with statis ascending esophagus. Compensatory strategies were discussed with pt to mitigate risks. Recommend he continue Dys 3, thin liquid, straws allowed, single pill with thin and remain upright 45 min after meals. Will follow up for esophageal education x 1. SLP Visit Diagnosis (No Data) Attention and concentration deficit following -- Frontal lobe and executive function deficit following -- Impact on safety and function Mild  aspiration risk   Treatment Recommendations 06/27/2021 Treatment Recommendations Therapy as outlined in treatment plan below   Prognosis 06/27/2021 Prognosis for Safe Diet Advancement Good Barriers to Reach Goals -- Barriers/Prognosis Comment -- Diet Recommendations 06/27/2021 SLP Diet Recommendations Dysphagia 3 (Mech soft) solids;Thin liquid Liquid Administration via Cup;Straw Medication Administration Whole meds with liquid Compensations Slow rate;Small sips/bites Postural Changes Seated upright at 90 degrees;Remain semi-upright after after feeds/meals (Comment)   Other Recommendations 06/27/2021 Recommended Consults Consider esophageal assessment Oral Care Recommendations Oral care BID Other Recommendations -- Follow Up Recommendations No SLP follow up Assistance recommended at discharge None Functional Status Assessment -- Frequency and Duration  06/27/2021 Speech Therapy Frequency (ACUTE ONLY) min 1 x/week Treatment Duration 1 week   Oral Phase 06/27/2021 Oral Phase WFL Oral - Pudding Teaspoon -- Oral - Pudding Cup -- Oral - Honey Teaspoon -- Oral - Honey Cup -- Oral - Nectar Teaspoon -- Oral - Nectar Cup -- Oral - Nectar Straw -- Oral - Thin Teaspoon -- Oral - Thin Cup -- Oral - Thin Straw -- Oral -  Puree -- Oral - Mech Soft -- Oral - Regular -- Oral - Multi-Consistency -- Oral - Pill -- Oral Phase - Comment --  Pharyngeal Phase 06/27/2021 Pharyngeal Phase WFL Pharyngeal- Pudding Teaspoon -- Pharyngeal -- Pharyngeal- Pudding Cup -- Pharyngeal -- Pharyngeal- Honey Teaspoon -- Pharyngeal -- Pharyngeal- Honey Cup -- Pharyngeal -- Pharyngeal- Nectar Teaspoon -- Pharyngeal -- Pharyngeal- Nectar Cup -- Pharyngeal -- Pharyngeal- Nectar Straw -- Pharyngeal -- Pharyngeal- Thin Teaspoon -- Pharyngeal -- Pharyngeal- Thin Cup -- Pharyngeal -- Pharyngeal- Thin Straw -- Pharyngeal -- Pharyngeal- Puree -- Pharyngeal -- Pharyngeal- Mechanical Soft -- Pharyngeal -- Pharyngeal- Regular -- Pharyngeal -- Pharyngeal- Multi-consistency  -- Pharyngeal -- Pharyngeal- Pill -- Pharyngeal -- Pharyngeal Comment --  Cervical Esophageal Phase  06/27/2021 Cervical Esophageal Phase WFL Pudding Teaspoon -- Pudding Cup -- Honey Teaspoon -- Honey Cup -- Nectar Teaspoon -- Nectar Cup -- Nectar Straw -- Thin Teaspoon -- Thin Cup -- Thin Straw -- Puree -- Mechanical Soft -- Regular -- Multi-consistency -- Pill -- Cervical Esophageal Comment -- [place] Siren 06/27/2021, 1:11 PM                     Medications:  [START ON 06/29/2021] sodium chloride     [START ON 06/29/2021] sodium chloride      ceFAZolin (ANCEF) IV 2 g (06/26/21 1535)    sodium chloride   Intravenous Once   amitriptyline  100 mg Oral QHS   apixaban  2.5 mg Oral BID   atorvastatin  80 mg Oral QPM   bisacodyl  10 mg Oral Once   chlorhexidine  15 mL Mouth Rinse BID   Chlorhexidine Gluconate Cloth  6 each Topical Q0600   darbepoetin (ARANESP) injection - DIALYSIS  200 mcg Intravenous Q Wed-HD   insulin aspart  0-6 Units Subcutaneous TID WC   insulin glargine-yfgn  10 Units Subcutaneous QHS   lactulose  30 g Oral Once   levothyroxine  100 mcg Oral QAC breakfast   lidocaine-prilocaine  1 application Topical Q M,W,F   mouth rinse  15 mL Mouth Rinse q12n4p   metoprolol tartrate  12.5 mg Oral BID   midodrine  10 mg Oral TID WC   multivitamin with minerals  1 tablet Oral Daily   pantoprazole (PROTONIX) IV  40 mg Intravenous Q12H   polyethylene glycol  34 g Oral Once   senna-docusate  2 tablet Oral BID   sertraline  100 mg Oral Daily   HD orders: MWF [place] 4 hrs 180NR3 450/Autoflow 1.5 116.5 kg 2.0K/2.0 Ca UFP 2 AVF -No Heparin - mircera 225 mcg every 2 weeks - last received 200 mcg on 12/14  - venofer 100 mg (ordered for 10 doses-not started)  - no vdra    Assessment/Plan: AHRF. Volume overload. Pulm edema on admit. Continue volume removal with HD. Now below OP dry weight. Will have lower EDW at discharge  ~117kg.  Recent COVID pna - was on vent 1/05 to 1/08.  ?aspiration PNA  ESRD - HD MWF.  Continue on schedule. HD 1/27 BP/ volume - Soft blood pressures this admission. [place] increased 10mg  TID. Volume improving, now below EDW.  Anemia ckd - Hgb 7.3 today.  S/PLAN: 2 units prbcs 1/23. Continue Aranesp 200 q Wed. Iron replete. Rec'd Ferrlecit 250mg  x4.  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 Cont auryxia ac.  Atrial Fibrillation w/ RVR - Rate controlled. On Eliquis, metoprolol  MSSA bacteremia. Prior to this admission. On cefazolin stop date 07/17/21.  New  cavitary lesion in RUL on CT. Seen by PCCM and felt consistent with septic pulmonary embolus. Plan for 6 weeks Augmentin at discharge  Dispo: D/C to SNF when stable.  QOL: Multiple medical issues. HD has prolonged his life but doubt it is now contributing to quality of life. Palliative Care consulted. Patient is open to hospice but plans in place to talk to son tomorrow. Appreciate assistance from Palliative Care.     Keirstan Iannello H. Declyn Offield NP-C 06/28/2021, 11:30 AM  [employer] [phone]

## 2021-06-28 NOTE — TOC Progression Note (Signed)
Transition of Care [facility]) - Progression Note    Patient Details  Name: Numan Zylstra MRN: [id number] Date of Birth: 03/13/1956  Transition of Care [facility]) CM/SW [place], LCSW Phone Number: 06/28/2021, 3:49 PM  Clinical Narrative:    CSW received insurance approval for patient to return to [place] when stable, Ref# [id number], Auth ID# [id number], effective  06/28/2021-07/02/2021. [facility] is able to accept patient over the weekend if needed (no new COVID test needed as patient is still in [place] + window from December).    Expected Discharge Plan: [place] Barriers to Discharge: Continued Medical Work up  Expected Discharge Plan and Services Expected Discharge Plan: [place] In-house Referral: Clinical Social Work   Post Acute Care Choice: [place] Living arrangements for the past 2 months: Single Family Home                                       Social Determinants of Health (SDOH) Interventions    Readmission Risk Interventions Readmission Risk Prevention Plan 06/21/2021 01/12/2019  Transportation Screening Complete Complete  PCP or Specialist Appt within 3-5 Days - Complete  HRI or [place] - Complete  Social Work Consult for [place] Planning/Counseling - Complete  Palliative Care Screening - Not Applicable  Medication Review Press photographer) Complete Complete  PCP or Specialist appointment within 3-5 days of discharge Complete -  [place] or Home Care Consult Complete -  SW Recovery Care/Counseling Consult Complete -  Palliative Care Screening Not Applicable -  Skilled Nursing Facility Complete -  Some recent data might be hidden

## 2021-06-28 NOTE — Consult Note (Addendum)
Palliative Medicine Inpatient Consult Note  Consulting Provider: Valentina Gu, NP  Reason for consult:   Richard Hall  Reason for Consult? Goal of care in patient with multiple comorbid conditions   HPI:  Per intake H&P --> 66 y.o. male with medical history significant of hypertension, atrial fibrillation, congestive heart failure, CVA, ESRD on HD, and prostate cancer. Recently d/c from hospital to SNF and readmitted with acute respiratory failure due to overload. On prior admission, found to have chronic non-healed Lt femur facture despite ORIF.  Palliative care has been asked to get involved to help address goals of care in the setting of chronic illness.  Clinical Assessment/Goals of Care:  *Please note that this is a verbal dictation therefore any spelling or grammatical errors are due to the "[place] One" system interpretation.  I have reviewed medical records including EPIC notes, labs and imaging, received report from bedside RN, assessed the patient who is resting in NAD.    I met with Richard Hall to further discuss diagnosis prognosis, GOC, EOL wishes, disposition and options.   I introduced Palliative Medicine as specialized medical care for people living with serious illness. It focuses on providing relief from the symptoms and stress of a serious illness. The goal is to improve quality of life for both the patient and the family.  Medical History Review and Understanding:  I spoke to Richard Hall this late morning. We reviewed his hisotry of metastatic prostate cancer, CHF, ESRD on HD MWF, and afib.   Social History:  Richard Hall is from [place], [place]. He is divorced. He has one son, Richard Hall and on granddaughter. 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 He use to love hunting and fishing. He is a man of faith and practices within the [facility] denomination.  Functional and  Nutritional State:  Richard Hall was prior living on his own in an apartment in [place]. He had a friend check in one weekend a month to help with things like food shopping. He was using a wheelchair but able to get OOB to chair. He could "stand and pivot". Since he was recently hospitalized at the end of December he has done poorly. He now is not functionally able to stand on his own. He expresses "wanting to get out of here". He reviews that he is tired of being in the hospital.   Patients nursing aid and I were able to get  Nutritionally patient maintains eating fairly.  Advance Directives: A detailed discussion was had today regarding advanced directives.  Patient has none on file though does endorse he would wish for his son, Richard Hall to be his primary Media planner.  Code Status: Concepts specific to code status, artifical feeding and hydration, continued IV antibiotics and rehospitalization was had.     I completed a MOST form today.   Cardiopulmonary Resuscitation: Do Not Attempt Resuscitation (DNR/No CPR)  Medical Interventions: Limited Additional Interventions: Use medical treatment, IV fluids and cardiac monitoring as indicated, DO NOT USE intubation or mechanical ventilation. May consider use of less invasive airway support such as BiPAP or CPAP. Also provide comfort measures. Transfer to the hospital if indicated. Avoid intensive care.   Antibiotics: Determine use of limitation of antibiotics when infection occurs  IV Fluids: IV fluids for a defined trial period  Feeding Tube: No feeding tube   Reviewed the importance of considering quality of life with every measure being performed. Reviewed  that Richard Hall's son should be involved in conversations so that he is apprised of all that is occurring.   Discussed the idea of hospice should Dsean continue to decline which he was open to. I described hospice as a service for patients who have a life expectancy of 6 months or less.   The goal  of hospice is the preservation of dignity and quality at the end phases of life.   Under hospice care, the focus changes from curative to symptom relief.   Discussed the importance of continued conversation with family and their  medical providers regarding overall plan of care and treatment options, ensuring decisions are within the context of the patients values and GOCs. __________________________ Addendum:  Patients son Richard Hall called. We reviewed that he has seen his father "suffer for a long time". He understands that Richard Hall is not improving greatly and wants his father to "decide what is bets for him". He shares that he would support any decision his father makes regarding future care even if that decision is geared towards comfort.   Decision Maker: Richard Hall (son) [phone]  SUMMARY OF RECOMMENDATIONS   DNAR/DNI  MOST Completed, paper copy placed onto the chart electric copy can be found in [facility]  DNR Form Completed, paper copy placed onto the chart electric copy can be found in [place] for additional conversations with patients son on speaker-phone tomorrow at 10AM to further discuss the idea of hospice  Ongoing Palliative support   Code Status/Advance Care Planning: DNAR/DNI   Palliative Prophylaxis:  Aspiration, Bowel Regimen, Delirium Protocol, Frequent Pain Assessment, Oral Care, Palliative Wound Care, and Turn Reposition  Additional Recommendations (Limitations, Scope, Preferences): Continue current care for the time being  Psycho-social/Spiritual:  Desire for further Chaplaincy support: Yes Additional Recommendations: Education on chronic disease processes, review of metastatic disease   Prognosis: Overall, prognosis is very poor in the setting of ESRD, metastatic prostate cancer to the bone, increased frailty in the setting of two prolonged hospital stays.   Discharge Planning: Discharge plan is uncertain  Vitals:   54/00/86 0400 06/28/21 0911  BP:  102/65 114/69  Pulse: 61 66  Resp: 18 20  Temp: 98.1 F (36.7 C) (!) 97.3 F (36.3 C)  SpO2: 100% 100%   No intake or output data in the 24 hours ending 06/28/21 1043 Last Weight  Most recent update: 06/27/2021  4:41 AM    Weight  121.4 kg (267 lb 11.2 oz)            Gen:  Faril elderly Caucasian M  HEENT: moist mucous membranes CV: Regular rate and irregular rhythm PULM: On 3LPM [place] ABD: soft/nontender EXT: No edema Neuro: Alert and oriented x3  PPS: 30%   This conversation/these recommendations were discussed with patient primary care team, Dr. Waldron Labs  MDM High  Medical Decision Making: 4 #/Complex Problems: 4                      Data Reviewed: 4                 Management: 4 (1-Straightforward, 2-Low, 3-Moderate, 4-High) ______________________________________________________ [place] Team Team Cell Phone: [phone] Please utilize secure chat with additional questions, if there is no response within 30 minutes please call the above phone number  Palliative Medicine Team providers are available by phone from 7am to 7pm daily and can be reached through the team cell phone.  Should this patient require  assistance outside of these hours, please call the patient's attending physician.

## 2021-06-29 LAB — RENAL FUNCTION PANEL
Albumin: 1.8 g/dL — ABNORMAL LOW (ref 3.5–5.0)
Anion gap: 8 (ref 5–15)
BUN: 30 mg/dL — ABNORMAL HIGH (ref 8–23)
CO2: 29 mmol/L (ref 22–32)
Calcium: 7.9 mg/dL — ABNORMAL LOW (ref 8.9–10.3)
Chloride: 93 mmol/L — ABNORMAL LOW (ref 98–111)
Creatinine, Ser: 3.9 mg/dL — ABNORMAL HIGH (ref 0.61–1.24)
GFR, Estimated: 16 mL/min — ABNORMAL LOW (ref >60)
Glucose, Bld: 156 mg/dL — ABNORMAL HIGH (ref 70–99)
Phosphorus: 3.8 mg/dL (ref 2.5–4.6)
Potassium: 3.8 mmol/L (ref 3.5–5.1)
Sodium: 130 mmol/L — ABNORMAL LOW (ref 135–145)

## 2021-06-29 LAB — CBC
HCT: 26 % — ABNORMAL LOW (ref 39.0–52.0)
Hemoglobin: 8.1 g/dL — ABNORMAL LOW (ref 13.0–17.0)
MCH: 29.5 pg (ref 26.0–34.0)
MCHC: 31.2 g/dL (ref 30.0–36.0)
MCV: 94.5 fL (ref 80.0–100.0)
Platelets: 160 10*3/uL (ref 150–400)
RBC: 2.75 MIL/uL — ABNORMAL LOW (ref 4.22–5.81)
RDW: 21.2 % — ABNORMAL HIGH (ref 11.5–15.5)
WBC: 6.5 10*3/uL (ref 4.0–10.5)
nRBC: 0.8 % — ABNORMAL HIGH (ref 0.0–0.2)

## 2021-06-29 LAB — GLUCOSE, CAPILLARY
Glucose-Capillary: 128 mg/dL — ABNORMAL HIGH (ref 70–99)
Glucose-Capillary: 234 mg/dL — ABNORMAL HIGH (ref 70–99)
Glucose-Capillary: 206 mg/dL — ABNORMAL HIGH (ref 70–99)
Glucose-Capillary: 167 mg/dL — ABNORMAL HIGH (ref 70–99)

## 2021-06-29 MED ORDER — POLYETHYLENE GLYCOL 3350 17 G PO PACK
34.0000 g | PACK | Freq: Three times a day (TID) | ORAL | Status: AC
  Administered 2021-06-29 (×3): 34 g via ORAL
  Filled 2021-06-29 (×3): qty 2

## 2021-06-29 MED ORDER — LACTULOSE 10 GM/15ML PO SOLN
30.0000 g | Freq: Once | ORAL | Status: AC
  Administered 2021-06-29: 30 g via ORAL
  Filled 2021-06-29: qty 45

## 2021-06-29 MED ORDER — BISACODYL 5 MG PO TBEC
10.0000 mg | DELAYED_RELEASE_TABLET | Freq: Once | ORAL | Status: AC
  Administered 2021-06-29: 10 mg via ORAL
  Filled 2021-06-29: qty 2

## 2021-06-29 NOTE — Progress Notes (Signed)
[facility] Progress Note   Subjective: Seen in room today. More alert, no C/Os. Next HD 07/01/2021. Palliative Care to discuss [place] with son via phone today.   Objective Vitals:   06/29/21 0300 06/29/21 0546 06/29/21 0806 06/29/21 1154  BP: 112/66  117/62 (!) 112/58  Pulse: 73  71 71  Resp: 16  19 18   Temp: 97.6 F (36.4 C)  97.6 F (36.4 C) 97.6 F (36.4 C)  TempSrc: Oral  Oral Oral  SpO2: 98%  100% 96%  Weight:  121 kg    Height:       Physical Exam General: Chronically ill appearing male in NAD Heart: S1,S2 irreg, irreg. Afib on monitor.  Lungs: CTAB Anteriorly Abdomen: obese NABS Extremities: 1+ BLE edema LLE no edema RLE Dialysis Access: L AVF + T/B   Additional Objective Labs: Basic Metabolic Panel: Recent Labs  Lab 06/27/21 0113 06/28/21 0112 06/29/21 0051  NA 129* 127* 130*  K 4.4 4.5 3.8  CL 93* 90* 93*  CO2 28 27 29   GLUCOSE 154* 154* 156*  BUN 30* 46* 30*  CREATININE 4.17* 5.54* 3.90*  CALCIUM 7.9* 8.0* 7.9*  PHOS 4.5 4.9* 3.8   Liver Function Tests: Recent Labs  Lab 06/27/21 0113 06/28/21 0112 06/29/21 0051  ALBUMIN 1.9* 1.8* 1.8*   No results for input(s): LIPASE, AMYLASE in the last 168 hours. CBC: Recent Labs  Lab 06/23/21 0057 06/24/21 0056 06/25/21 0609 06/25/21 2147 06/26/21 0231 06/27/21 0113 06/28/21 0112 06/29/21 0423  WBC 7.6 7.9   < > 7.7 7.9 6.2 6.6 6.5  NEUTROABS 5.9 6.1  --   --   --   --   --   --   HGB 7.1* 6.6*   < > 8.3* 8.1* 7.8* 7.3* 8.1*  HCT 24.3* 22.4*   < > 26.7* 27.2* 25.4* 23.8* 26.0*  MCV 98.0 97.8   < > 97.1 96.1 96.2 96.0 94.5  PLT 161 150   < > 136* 131* 130* 141* 160   < > = values in this interval not displayed.   Blood Culture    Component Value Date/Time   SDES BLOOD RIGHT ARM 06/15/2021 0919   SPECREQUEST  06/15/2021 0919    BOTTLES DRAWN AEROBIC AND ANAEROBIC Blood Culture adequate volume   CULT  06/15/2021 0919    NO GROWTH 5 DAYS Performed at [facility],  [place] [address], [place], [place] [address]    REPTSTATUS 06/20/2021 FINAL 06/15/2021 0919    Cardiac Enzymes: No results for input(s): CKTOTAL, CKMB, CKMBINDEX, TROPONINI in the last 168 hours. CBG: Recent Labs  Lab 06/27/21 2035 06/28/21 0804 06/28/21 1548 06/28/21 2044 06/29/21 0832  GLUCAP 183* 132* 124* 164* 128*   Iron Studies: No results for input(s): IRON, TIBC, TRANSFERRIN, FERRITIN in the last 72 hours. @lablastinr3 @ Studies/Results: No results found. Medications:   ceFAZolin (ANCEF) IV 2 g (06/28/21 1627)    sodium chloride   Intravenous Once   amitriptyline  100 mg Oral QHS   apixaban  2.5 mg Oral BID   atorvastatin  80 mg Oral QPM   bisacodyl  10 mg Oral Once   chlorhexidine  15 mL Mouth Rinse BID   Chlorhexidine Gluconate Cloth  6 each Topical Q0600   darbepoetin (ARANESP) injection - DIALYSIS  200 mcg Intravenous Q Wed-HD   insulin aspart  0-6 Units Subcutaneous TID WC   insulin glargine-yfgn  10 Units Subcutaneous QHS   lactulose  30 g Oral Once  levothyroxine  100 mcg Oral QAC breakfast   lidocaine-prilocaine  1 application Topical Q M,W,F   mouth rinse  15 mL Mouth Rinse q12n4p   metoprolol tartrate  12.5 mg Oral BID   midodrine  10 mg Oral TID WC   multivitamin with minerals  1 tablet Oral Daily   pantoprazole (PROTONIX) IV  40 mg Intravenous Q12H   polyethylene glycol  34 g Oral TID   senna-docusate  2 tablet Oral BID   sertraline  100 mg Oral Daily     HD orders: MWF [place] 4 hrs 180NR3 450/Autoflow 1.5 116.5 kg 2.0K/2.0 Ca UFP 2 AVF -No Heparin - mircera 225 mcg every 2 weeks - last received 200 mcg on 12/14  - venofer 100 mg (ordered for 10 doses-not started)  - no vdra      Assessment/Plan: AHRF. Volume overload. Pulm edema on admit. Continue volume removal with HD. Now below OP dry weight. Will have lower EDW at discharge  ~117kg.  Recent COVID pna - was on vent 1/05 to 1/08. ?aspiration PNA  ESRD - HD MWF.  Continue on schedule. Next  HD 07/01/2021 BP/ volume - Soft blood pressures this admission. [place] increased 10mg  TID. Net UF with HD 06/28/2021  3 liters. No post wt. Continue to lower volume as tolerated.  Anemia ckd - Hgb 7.3 today.  S/PLAN: 2 units prbcs 1/23. Continue Aranesp 200 q Wed. Iron replete. Rec'd Ferrlecit 250mg  x4.  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 Cont auryxia ac.  Atrial Fibrillation w/ RVR - Rate controlled. On Eliquis, metoprolol  MSSA bacteremia. Prior to this admission. On cefazolin stop date 07/17/21.  New cavitary lesion in RUL on CT. Seen by PCCM and felt consistent with septic pulmonary embolus. Plan for 6 weeks Augmentin at discharge  Dispo: D/C to SNF when stable.  QOL: Multiple medical issues. HD has prolonged his life but doubt it is now contributing to quality of life. Palliative Care consulted. Patient is open to hospice but plans in place to talk to son today. Appreciate assistance from Palliative Care.     Richard Elza H. Yaslene Lindamood NP-C 06/29/2021, 12:21 PM  [employer] [phone]

## 2021-06-29 NOTE — Plan of Care (Signed)
°  Problem: Activity: Goal: Risk for activity intolerance will decrease Outcome: Progressing   Problem: Nutrition: Goal: Adequate nutrition will be maintained Outcome: Progressing   Problem: Coping: Goal: Level of anxiety will decrease Outcome: Progressing   Problem: Elimination: Goal: Will not experience complications related to bowel motility Outcome: Progressing   Problem: Elimination: Goal: Will not experience complications related to urinary retention Outcome: Progressing   Problem: Pain Managment: Goal: General experience of comfort will improve Outcome: Progressing   Problem: Safety: Goal: Ability to remain free from injury will improve Outcome: Progressing   Problem: Skin Integrity: Goal: Risk for impaired skin integrity will decrease Outcome: Progressing   Problem: Education: Goal: Knowledge of General Education information will improve Description: Including pain rating scale, medication(s)/side effects and non-pharmacologic comfort measures Outcome: Not Progressing   Problem: Health Behavior/Discharge Planning: Goal: Ability to manage health-related needs will improve Outcome: Not Progressing   Problem: Clinical Measurements: Goal: Ability to maintain clinical measurements within normal limits will improve Outcome: Not Progressing Goal: Will remain free from infection Outcome: Not Progressing Goal: Diagnostic test results will improve Outcome: Not Progressing Goal: Respiratory complications will improve Outcome: Not Progressing Goal: Cardiovascular complication will be avoided Outcome: Not Progressing

## 2021-06-29 NOTE — Progress Notes (Signed)
PROGRESS NOTE                                                                                                                                                                                                             Patient Demographics:    Richard Hall, is a 66 y.o. male, DOB - 09/27/1955, ZJQ:[id number]  Outpatient Primary MD for the patient is [name], MD    LOS - 14  Admit date - 06/15/2021    Chief Complaint  Patient presents with   Shortness of Breath       Brief Narrative (HPI from H&P)      Richard Hall is a 66 y.o. male with medical history significant of hypertension, atrial fibrillation, congestive heart failure, CVA, ESRD on HD, and prostate cancer presents with complaints of shortness of breath.  Patient had just recently been hospitalized from 12/30-1/13 after initially presenting with septic shock related to MSSA bacteremia and COVID-19 viral pneumonia.  During his hospital stay patient required intubation.  ID had been consulted and patient was recommended to complete 6 weeks of cefazolin to be completed on 07/17/2021.  He went to the rehab facility but soon was sent back to the hospital for hypoxia, in the ER his work-up was suggestive of possible continued pneumonia, troponin, cardiology was consulted and he was admitted for further care.   Subjective:   No significant events overnight as discussed with staff, patient reports generalized weakness, poor appetite, he still with no bowel movements over last 4 days.     Assessment  & Plan :   Acute Hypoxic Resp. Failure  - due to Acute on chronic diastolic CHF in the presence of severe MR, EF 60% on recent echo with NSTEMI in a patient with ESRD and recent COVID-19 pneumonia with a new cavitary lesion on CT scan ?  Chronic intermittent microaspiration   Acute on chronic diastolic CHF -Volume management with hemodialysis.  cavitary pneumonia -  no sepsis this admission, however does have leukocytosis with a cavitary lesion on CT scan raising the suspicion for possible underlying aspiration, SLP following, also continue fluid removal through HD, renal and cardiology on board will also request pulmonary to opine on his cavitary lung lesion. CRP and procalcitonin also point towards a bacterial infectious etiology as well.  He was briefly placed on  Unasyn with excellent clinical improvement, cultures here this admission negative thus far.  Have been consulted ID discussed the plan with pulmonary on 06/20/2021 plan is to continue cefazolin till 07/17/2021 thereafter outpatient ID and pulmonary follow-up.   - Encouraged to sit up in chair in the daytime use I-S and flutter valve for pulmonary toiletry.  Will advance activity and titrate down oxygen as possible.   - He is on cefazolin for recent MSSA bacteremia with stop date of 07/17/2021 (pharm confirmed)   Prostate cancer with metastasis to the bone -He is being followed by Dr. Tresa Moore from urology, Dr. Alen Blew from oncology   ESRD.   - Renal on board for fluid removal through HD.  Recent MSSA bacteremia.   - Cefazolin with a stop date of 07/17/2021 seen by ID this admission.  Dysphagia.   - Speech therapy following currently on dysphagia 2 diet.  Kindly see #1 above.  AOCD.  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   -Concern for chronic blood loss anemia, GI consulted, patient is high risk for procedures. -Resumed on Eliquis today at 2.5 mg p.o. twice daily, monitor CBC closely -On Aranesp and IV iron per renal -Patient with profound anemia, requiring multiple transfusions, despite being on Aranesp and IV iron per renal,  eulogy can be multifactorial, may be related to mild chronic blood loss anemia, but as well patient with prostate cancer and bone metastasis which is  contributing as well. -No significant evidence of GI bleed at this point, no bowel movements for few days, but will push further with laxatives so I can obtain Hemoccult. -Hemoglobin has improved after 1 unit PRBC transfusion yesterday, it is 8.1 today, so far required 3 units transfusion during hospital stay.  Obesity with OSA. -  BMI 35 follow with PCP for weight loss nightly CPAP.  Counseled on compliance  Recent left ankle fracture.  -  Nonweightbearing left leg per last admission, outpatient orthopedics follow-up.  PT OT.  Hypothyroidism.   -On Synthroid   Chronic A. fib [place] vas 2 score of greater than 4.   - On Eliquis continue low-dose beta-blocker as tolerated by blood pressure.    Hypotension.   -Blood pressure has stabilized and improving titrate off midodrine.  Dyslipidemia.   -On Statin  Hyponatremia - management with dialysis.  History of prostate cancer - Continue Xtandi.  Elevated troponin.   - Likely demand ischemia in the setting of ESRD, demand mismatch caused by hypoxia from #1 above, chest pain-free.  Cardiology on board, on Eliquis, statin, low-dose beta-blocker as tolerated by blood pressure and monitor  Severe MR.  -  Outpatient cardiology follow-up.  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  History of COVID-19 infection in the past, still COVID-19 positive.  Repeat test on 06/25/2021 is still positive but cycle threshold is 39  i.e. he is not contagious and this is not an active infection.    DM type II.  -  On Lantus and sliding scale.  Monitor and adjust  Lab Results  Component Value Date   HGBA1C 6.1 (H) 06/01/2021    CBG (last 3)  Recent Labs    06/28/21 1548 06/28/21 2044 06/29/21 0832  GLUCAP 124* 164* 128*  Goals of care -Patient with very poor functional status, weak,  deconditioned, HD dependent, multiple comorbidities, and with metastatic prostate cancer, palliative medicine has been consulted to address goals of care.  Palliative to have a family meeting today with son via phone.      Condition - Extremely Guarded  Family Communication  : None at bedside  Code Status : DNR  Consults  : Cardiology, Pulmonary, renal, Palliative   PUD Prophylaxis :    Procedures  :     CT Scan abdomen pelvis repeat on 06/25/2021 without contrast.  No evidence of retroperitoneal bleed.  CT scan - 1. The appearance of the chest suggest congestive heart failure, as above. 2. In addition, there is a new thick-walled cavitary nodule measuring 1.4 x 1.2 cm in the right upper lobe. This is potentially infectious or inflammatory in etiology, however, underlying neoplasm is not excluded. Short-term follow-up repeat noncontrast chest CT is recommended in 2-3 months to ensure the stability or regression of this lesion. 3. Mildly enlarged right paratracheal lymph node, nonspecific in the setting of presumed congestive heart failure, but concerning in light of the right upper lobe pulmonary nodule. Attention at time of repeat CT examination is recommended to ensure stability/regression. 4. Diffuse sclerotic lesions noted throughout the visualized axial and appendicular skeleton, presumably reflective of widespread osseous metastasis in this patient with history of prostate cancer. 5. Aortic atherosclerosis, in addition to left main and three-vessel coronary artery disease. Please note that although the presence of coronary artery calcium documents the presence of coronary artery disease, the severity of this disease and any potential stenosis cannot be assessed on this non-gated CT examination. Assessment for potential risk factor modification, dietary therapy or pharmacologic therapy may be warranted, if clinically indicated. 6. There are calcifications of the aortic valve. Echocardiographic  correlation for evaluation of potential valvular dysfunction may be warranted if clinically indicated. Aortic Atherosclerosis (ICD10-I70.0).      Disposition Plan  :    Status is: Inpatient  Remains inpatient appropriate because: Acute hypoxic respiratory failure  DVT Prophylaxis  :    apixaban (ELIQUIS) tablet 2.5 mg Start: 06/27/21 1000 apixaban (ELIQUIS) tablet 2.5 mg     Lab Results  Component Value Date   PLT 160 06/29/2021    Diet :  Diet Order             Diet renal with fluid restriction Fluid restriction: 1200 mL Fluid; Room service appropriate? Yes; Fluid consistency: Thin  Diet effective now                    Inpatient Medications  Scheduled Meds:  sodium chloride   Intravenous Once   amitriptyline  100 mg Oral QHS   apixaban  2.5 mg Oral BID   atorvastatin  80 mg Oral QPM   bisacodyl  10 mg Oral Once   chlorhexidine  15 mL Mouth Rinse BID   Chlorhexidine Gluconate Cloth  6 each Topical Q0600   darbepoetin (ARANESP) injection - DIALYSIS  200 mcg Intravenous Q Wed-HD   insulin aspart  0-6 Units Subcutaneous TID WC   insulin glargine-yfgn  10 Units Subcutaneous QHS   levothyroxine  100 mcg Oral QAC breakfast   lidocaine-prilocaine  1 application Topical Q M,W,F   mouth rinse  15 mL Mouth Rinse q12n4p   metoprolol tartrate  12.5 mg Oral BID   midodrine  10 mg Oral TID WC   multivitamin with minerals  1 tablet Oral Daily  pantoprazole (PROTONIX) IV  40 mg Intravenous Q12H   polyethylene glycol  34 g Oral TID   senna-docusate  2 tablet Oral BID   sertraline  100 mg Oral Daily   Continuous Infusions:   ceFAZolin (ANCEF) IV 2 g (06/28/21 1627)   PRN Meds:.acetaminophen **OR** acetaminophen, albuterol, calcium carbonate, diphenhydrAMINE, diphenoxylate-atropine, guaiFENesin, ondansetron (ZOFRAN) IV      Phillips Climes M.D on 06/29/2021 at 12:15 PM  To page go to [email or web address]   [facility] -  Office  [phone]  See all Orders  from today for further details    Objective:   Vitals:   06/29/21 0300 06/29/21 0546 06/29/21 0806 06/29/21 1154  BP: 112/66  117/62 (!) 112/58  Pulse: 73  71 71  Resp: 16  19 18   Temp: 97.6 F (36.4 C)  97.6 F (36.4 C) 97.6 F (36.4 C)  TempSrc: Oral  Oral Oral  SpO2: 98%  100% 96%  Weight:  121 kg    Height:        Wt Readings from Last 3 Encounters:  06/29/21 121 kg  06/14/21 117.6 kg  07/26/20 125.3 kg     Intake/Output Summary (Last 24 hours) at 06/29/2021 1215 Last data filed at 06/28/2021 1520 Gross per 24 hour  Intake 715 ml  Output 3000 ml  Net -2285 ml       Physical Exam  Awake Alert, frail, appears much older than stated age, deconditioned.   Symmetrical Chest wall movement, diminished air entry at the bases. RRR,No Gallops,Rubs or new Murmurs, No Parasternal Heave +ve B.Sounds, Abd Soft, No tenderness, No rebound - guarding or rigidity. No Cyanosis, Clubbing or edema, No new Rash or bruise        RN pressure injury documentation: Pressure Injury 06/11/21 Coccyx Medial Stage 2 -  Partial thickness loss of dermis presenting as a shallow open injury with a red, pink wound bed without slough. (Active)  06/11/21 2159  Location: Coccyx  Location Orientation: Medial  Staging: Stage 2 -  Partial thickness loss of dermis presenting as a shallow open injury with a red, pink wound bed without slough.  Wound Description (Comments):   Present on Admission:      Data Review:   Recent Labs  Lab 06/23/21 0057 06/24/21 0056 06/25/21 0609 06/25/21 2147 06/26/21 0231 06/27/21 0113 06/28/21 0112 06/29/21 0423  WBC 7.6 7.9   < > 7.7 7.9 6.2 6.6 6.5  HGB 7.1* 6.6*   < > 8.3* 8.1* 7.8* 7.3* 8.1*  HCT 24.3* 22.4*   < > 26.7* 27.2* 25.4* 23.8* 26.0*  PLT 161 150   < > 136* 131* 130* 141* 160  MCV 98.0 97.8   < > 97.1 96.1 96.2 96.0 94.5  MCH 28.6 28.8   < > 30.2 28.6 29.5 29.4 29.5  MCHC 29.2* 29.5*   < > 31.1 29.8* 30.7 30.7 31.2  RDW 20.1* 21.0*   <  > 20.6* 20.6* 20.8* 20.3* 21.2*  LYMPHSABS 0.7 0.7  --   --   --   --   --   --   MONOABS 0.7 0.9  --   --   --   --   --   --   EOSABS 0.1 0.1  --   --   --   --   --   --   BASOSABS 0.0 0.0  --   --   --   --   --   --    < > =  values in this interval not displayed.    Recent Labs  Lab 06/23/21 0057 06/24/21 0056 06/25/21 0159 06/25/21 1208 06/26/21 0231 06/27/21 0113 06/28/21 0112 06/29/21 0051  NA 133*   < > 131*  --  125* 129* 127* 130*  K 3.8   < > 4.1  --  4.6 4.4 4.5 3.8  CL 94*   < > 94*  --  89* 93* 90* 93*  CO2 29   < > 29  --  25 28 27 29   GLUCOSE 205*   < > 137*  --  167* 154* 154* 156*  BUN 37*   < > 29*  --  40* 30* 46* 30*  CREATININE 4.86*   < > 4.18*  --  5.40* 4.17* 5.54* 3.90*  CALCIUM 8.1*   < > 8.1*  --  8.1* 7.9* 8.0* 7.9*  ALBUMIN 2.0*   < > 2.1*  --  2.0* 1.9* 1.8* 1.8*  CRP 5.9*  --   --   --   --   --   --   --   DDIMER  --   --   --  1.29*  --   --   --   --   INR  --   --   --  1.7*  --   --   --   --   BNP 1,284.6*  --   --   --   --   --   --   --    < > = values in this interval not displayed.     Radiology Reports DG Swallowing Func-Speech Pathology  Result Date: 06/27/2021 Table formatting from the original result was not included. Objective Swallowing Evaluation: Type of Study: MBS-Modified Barium Swallow Study  Patient Details Name: Merville Hijazi MRN: [id number] Date of Birth: July 25, 1955 Today's Date: 06/27/2021 Time: SLP Start Time (ACUTE ONLY): 1200 -SLP Stop Time (ACUTE ONLY): 5885 SLP Time Calculation (min) (ACUTE ONLY): 15 min Past Medical History: Past Medical History: Diagnosis Date  A-V fistula ([place])   left arm  Anemia   Arthritis   CHF (congestive heart failure) ([place])   CVA (cerebral vascular accident) ([place]) 2010  Diabetes mellitus with end-stage renal disease ([place])   Dyalisis pt.  Diabetic neuropathy ([facility])   Bilateral feet  Dyslipidemia   Dysrhythmia   A-fib  Essential hypertension   Hepatitis   childhood  History of blood transfusion    Hypothyroidism (acquired)   OSA on CPAP   Prostate cancer [facility])   Uses wheelchair  Past Surgical History: Past Surgical History: Procedure Laterality Date  APPENDECTOMY    COLONOSCOPY    FEMUR IM NAIL Left 05/20/2019  Procedure: Intramedullary (Im) Retrograde Femoral Nailing for impending pathologic fracture;  Surgeon: Altamese Bartlett, MD;  Location: [facility];  Service: Orthopedics;  Laterality: Left;  ORIF FEMUR FRACTURE Left 05/20/2019  Procedure: OPEN REDUCTION INTERNAL FIXATION (ORIF) DISTAL FEMUR FRACTURE with intercondylar extension;  Surgeon: Altamese Ossian, MD;  Location: [facility];  Service: Orthopedics;  Laterality: Left;  TRANSURETHRAL RESECTION OF BLADDER TUMOR N/A 01/07/2019  Procedure: CYSTOSCOPY/ EVEACUATION CLOT;  Surgeon: Bjorn Loser, MD;  Location: [facility];  Service: Urology;  Laterality: N/A;  TRANSURETHRAL RESECTION OF PROSTATE N/A 07/26/2020  Procedure: TRANSURETHRAL RESECTION OF THE PROSTATE (TURP);  Surgeon: Alexis Frock, MD;  Location: [facility];  Service: Urology;  Laterality: N/A;  46 MINS HPI: 66 y.o. male with medical history significant of hypertension, atrial fibrillation, congestive heart failure, CVA, ESRD  on HD, and prostate cancer. Recently d/c (admitted 12/20-1/13)from hospital to SNF and readmitted with acute respiratory failure due to overload. Patient had been seen by ST services during previous admission and at that time was tolerating a Dys 2, thin liquids diet; ST continuing to f/u for diet progression.  Subjective: awake and alert but appears confused and apathetic  Recommendations for follow up therapy are one component of a multi-disciplinary discharge planning process, led by the attending physician.  Recommendations may be updated based on patient status, additional functional criteria and insurance authorization. Assessment / Plan / Recommendation Clinical Impressions 06/27/2021 Clinical Impression Pt exhibits a suspected primary esophageal dysphagia. Transit time with solid  affected by missing dentition rather than impairment. Thin liquid was penetrated and remained in vestibule x 1 and minimal vallecular residue both of which are normal and typical findings. His esophagus was scanned revealing hesitation and slow transit with pill with statis ascending esophagus. Compensatory strategies were discussed with pt to mitigate risks. Recommend he continue Dys 3, thin liquid, straws allowed, single pill with thin and remain upright 45 min after meals. Will follow up for esophageal education x 1. SLP Visit Diagnosis (No Data) Attention and concentration deficit following -- Frontal lobe and executive function deficit following -- Impact on safety and function Mild aspiration risk   Treatment Recommendations 06/27/2021 Treatment Recommendations Therapy as outlined in treatment plan below   Prognosis 06/27/2021 Prognosis for Safe Diet Advancement Good Barriers to Reach Goals -- Barriers/Prognosis Comment -- Diet Recommendations 06/27/2021 SLP Diet Recommendations Dysphagia 3 (Mech soft) solids;Thin liquid Liquid Administration via Cup;Straw Medication Administration Whole meds with liquid Compensations Slow rate;Small sips/bites Postural Changes Seated upright at 90 degrees;Remain semi-upright after after feeds/meals (Comment)   Other Recommendations 06/27/2021 Recommended Consults Consider esophageal assessment Oral Care Recommendations Oral care BID Other Recommendations -- Follow Up Recommendations No SLP follow up Assistance recommended at discharge None Functional Status Assessment -- Frequency and Duration  06/27/2021 Speech Therapy Frequency (ACUTE ONLY) min 1 x/week Treatment Duration 1 week   Oral Phase 06/27/2021 Oral Phase WFL Oral - Pudding Teaspoon -- Oral - Pudding Cup -- Oral - Honey Teaspoon -- Oral - Honey Cup -- Oral - Nectar Teaspoon -- Oral - Nectar Cup -- Oral - Nectar Straw -- Oral - Thin Teaspoon -- Oral - Thin Cup -- Oral - Thin Straw -- Oral - Puree -- Oral - Mech Soft --  Oral - Regular -- Oral - Multi-Consistency -- Oral - Pill -- Oral Phase - Comment --  Pharyngeal Phase 06/27/2021 Pharyngeal Phase WFL Pharyngeal- Pudding Teaspoon -- Pharyngeal -- Pharyngeal- Pudding Cup -- Pharyngeal -- Pharyngeal- Honey Teaspoon -- Pharyngeal -- Pharyngeal- Honey Cup -- Pharyngeal -- Pharyngeal- Nectar Teaspoon -- Pharyngeal -- Pharyngeal- Nectar Cup -- Pharyngeal -- Pharyngeal- Nectar Straw -- Pharyngeal -- Pharyngeal- Thin Teaspoon -- Pharyngeal -- Pharyngeal- Thin Cup -- Pharyngeal -- Pharyngeal- Thin Straw -- Pharyngeal -- Pharyngeal- Puree -- Pharyngeal -- Pharyngeal- Mechanical Soft -- Pharyngeal -- Pharyngeal- Regular -- Pharyngeal -- Pharyngeal- Multi-consistency -- Pharyngeal -- Pharyngeal- Pill -- Pharyngeal -- Pharyngeal Comment --  Cervical Esophageal Phase  06/27/2021 Cervical Esophageal Phase WFL Pudding Teaspoon -- Pudding Cup -- Honey Teaspoon -- Honey Cup -- Nectar Teaspoon -- Nectar Cup -- Nectar Straw -- Thin Teaspoon -- Thin Cup -- Thin Straw -- Puree -- Mechanical Soft -- Regular -- Multi-consistency -- Pill -- Cervical Esophageal Comment -- [place] Siren 06/27/2021, 1:11 PM  Patient ID: [name], male   DOB: July 22, 1955, 66 y.o.   MRN: [id number] Patient ID: Kimon Loewen, male   DOB: 24-Apr-1956, 66 y.o.   MRN: [id number] Patient ID: Jashon Ishida, male   DOB: [birth year], 66 y.o.   MRN: [id number] Patient ID: Hawke Villalpando, male   DOB: September 25, 1955, 66 y.o.   MRN: [id number]

## 2021-06-29 NOTE — Progress Notes (Signed)
Palliative Medicine Inpatient Follow Up Note  Consulting Provider: Valentina Gu, NP   Reason for consult:   [place]  Reason for Consult? Goal of care in patient with multiple comorbid conditions    HPI:  Per intake H&P --> 66 y.o. male with medical history significant of hypertension, atrial fibrillation, congestive heart failure, CVA, ESRD on HD, and prostate cancer. Recently d/c from hospital to SNF and readmitted with acute respiratory failure due to overload. On prior admission, found to have chronic non-healed Lt femur facture despite ORIF.   Palliative care has been asked to get involved to help address goals of care in the setting of chronic illness.  Today's Discussion (06/29/2021):  *Please note that this is a verbal dictation therefore any spelling or grammatical errors are due to the "[place]" system interpretation.  Chart reviewed inclusive of progress notes, laboratory results, and diagnostic images.   A meeting was held with Richard Hall and his son, Richard Hall (via speaker-phone) this morning.   A review of Richard Hall's medical conditions was held. Discussed his HTN, Afib, CHF prior CVA, ESRD, and metastatic prostate cancer. Reviewed that Richard Hall has not thrived in many weeks and if anything continues to weaken and decline further.   We discussed how hemodialysis has taken a toll on [name]'s mental and emotional state.   Richard Hall shares that he wants his father to be happy and live out the best quality of life he can. Richard Hall express that he doesn't want to lose Richard Hall but wants to respect whatever his wishes are.  Created space and opportunity for patient to explore thoughts feelings and fears regarding current medical situation.  Richard Hall took some time to think and shares that he is "not ready to give up yet." I spend time listening to his reasoning. We reviewed that he wanted to try skilled nursing one  more time and if he is unable to progress he would like to consider hospice care.  We did review under hospice his time would be short < 2 weeks in the setting of stopping HD which he understands. Discussed symptom management with hospice as well.  We summarized with the plan for SNF and OP Palliative support for the time being. If patient is not thriving to transition to hospice with a comfort focus.   Questions and concerns addressed   Palliative Support Provided.   Objective Assessment: Vital Signs Vitals:   06/29/21 0806 06/29/21 1154  BP: 117/62 (!) 112/58  Pulse: 71 71  Resp: 19 18  Temp: 97.6 F (36.4 C) 97.6 F (36.4 C)  SpO2: 100% 96%   No intake or output data in the 24 hours ending 06/29/21 1522 Last Weight  Most recent update: 06/29/2021  5:47 AM    Weight  121 kg (266 lb 12.1 oz)            Gen:  Frail elderly Caucasian M  HEENT: moist mucous membranes CV: Regular rate and irregular rhythm PULM: On 2LPM , (+) rhonchi ABD: soft/nontender EXT: No edema Neuro: Alert and oriented x3  SUMMARY OF RECOMMENDATIONS   DNAR/DNI   MOST / DNR Form Completed, paper copy placed onto the chart electric copy can be found in [employer]   Patient would like to try skilled nursing, he is not yet ready to "give up" he shares that if he is not excelling or starts to decline he is open to further hospice conversations  TOC - Request OP  Palliative follow up - will need a provider to have ongoing Reader and if patient starts to decline guide he and his family through the process of hospice enrollment. He would likely at that time need inpatient hospice a  Ongoing Palliative support   Time Spent - 65 minutes  MDM - High  Medical Decision Making: 4 #/Complex Problems: 4                     Data Reviewed:    4             Management: 4 (1-Straightforward, 2-Low, 3-Moderate, 4-High) ______________________________________________________________________________________ [place] Palliative Medicine Team Team Cell Phone: [phone] Please utilize secure chat with additional questions, if there is no response within 30 minutes please call the above phone number  Palliative Medicine Team providers are available by phone from 7am to 7pm daily and can be reached through the team cell phone.  Should this patient require assistance outside of these hours, please call the patient's attending physician.

## 2021-06-30 LAB — CBC
HCT: 26 % — ABNORMAL LOW (ref 39.0–52.0)
Hemoglobin: 7.8 g/dL — ABNORMAL LOW (ref 13.0–17.0)
MCH: 29 pg (ref 26.0–34.0)
MCHC: 30 g/dL (ref 30.0–36.0)
MCV: 96.7 fL (ref 80.0–100.0)
Platelets: 185 10*3/uL (ref 150–400)
RBC: 2.69 MIL/uL — ABNORMAL LOW (ref 4.22–5.81)
RDW: 20.9 % — ABNORMAL HIGH (ref 11.5–15.5)
WBC: 8.7 10*3/uL (ref 4.0–10.5)
nRBC: 0.6 % — ABNORMAL HIGH (ref 0.0–0.2)

## 2021-06-30 LAB — GLUCOSE, CAPILLARY
Glucose-Capillary: 213 mg/dL — ABNORMAL HIGH (ref 70–99)
Glucose-Capillary: 239 mg/dL — ABNORMAL HIGH (ref 70–99)
Glucose-Capillary: 165 mg/dL — ABNORMAL HIGH (ref 70–99)
Glucose-Capillary: 154 mg/dL — ABNORMAL HIGH (ref 70–99)

## 2021-06-30 LAB — RENAL FUNCTION PANEL
Albumin: 1.9 g/dL — ABNORMAL LOW (ref 3.5–5.0)
Anion gap: 11 (ref 5–15)
BUN: 43 mg/dL — ABNORMAL HIGH (ref 8–23)
CO2: 26 mmol/L (ref 22–32)
Calcium: 8.2 mg/dL — ABNORMAL LOW (ref 8.9–10.3)
Chloride: 91 mmol/L — ABNORMAL LOW (ref 98–111)
Creatinine, Ser: 5.31 mg/dL — ABNORMAL HIGH (ref 0.61–1.24)
GFR, Estimated: 11 mL/min — ABNORMAL LOW (ref >60)
Glucose, Bld: 194 mg/dL — ABNORMAL HIGH (ref 70–99)
Phosphorus: 4.7 mg/dL — ABNORMAL HIGH (ref 2.5–4.6)
Potassium: 3.9 mmol/L (ref 3.5–5.1)
Sodium: 128 mmol/L — ABNORMAL LOW (ref 135–145)

## 2021-06-30 LAB — HEMOCCULT GUIAC POC 1CARD (OFFICE): Fecal Occult Blood, POC: POSITIVE — AB

## 2021-06-30 MED ORDER — LACTULOSE 10 GM/15ML PO SOLN
30.0000 g | Freq: Three times a day (TID) | ORAL | Status: AC
  Administered 2021-06-30 – 2021-07-01 (×3): 30 g via ORAL
  Filled 2021-06-30 (×4): qty 45

## 2021-06-30 MED ORDER — POLYETHYLENE GLYCOL 3350 17 G PO PACK
17.0000 g | PACK | Freq: Two times a day (BID) | ORAL | Status: DC
  Administered 2021-07-02: 17 g via ORAL
  Filled 2021-06-30: qty 1

## 2021-06-30 MED ORDER — BISACODYL 10 MG RE SUPP
10.0000 mg | Freq: Once | RECTAL | Status: AC
  Administered 2021-06-30: 10 mg via RECTAL
  Filled 2021-06-30: qty 1

## 2021-06-30 MED ORDER — BISACODYL 5 MG PO TBEC
10.0000 mg | DELAYED_RELEASE_TABLET | Freq: Once | ORAL | Status: AC
  Administered 2021-06-30: 10 mg via ORAL
  Filled 2021-06-30: qty 2

## 2021-06-30 MED ORDER — BISACODYL 5 MG PO TBEC
10.0000 mg | DELAYED_RELEASE_TABLET | Freq: Once | ORAL | Status: AC
Start: 2021-06-30 — End: 2021-06-30

## 2021-06-30 MED ORDER — POLYETHYLENE GLYCOL 3350 17 G PO PACK
34.0000 g | PACK | Freq: Three times a day (TID) | ORAL | Status: AC
  Administered 2021-06-30 (×2): 34 g via ORAL
  Filled 2021-06-30 (×3): qty 2

## 2021-06-30 NOTE — TOC Progression Note (Signed)
Transition of Care [facility]) - Progression Note    Patient Details  Name: Richard Hall MRN: [id number] Date of Birth: [birth year]  Transition of Care [facility] CM/SW Contact  Emeterio Reeve, [place] Phone Number: 06/30/2021, 3:10 PM  Clinical Narrative:     Per MD pt is not ready for DC. TOC will continue to follow.   Expected Discharge Plan: [place] Barriers to Discharge: Continued Medical Work up  Expected Discharge Plan and Services Expected Discharge Plan: [place] In-house Referral: Clinical Social Work   Post Acute Care Choice: [place] Living arrangements for the past 2 months: [place] Determinants of Health (SDOH) Interventions    Readmission Risk Interventions Readmission Risk Prevention Plan 06/21/2021 01/12/2019  Transportation Screening Complete Complete  PCP or Specialist Appt within 3-5 Days - Complete  HRI or [place] - Complete  Social Work Consult for [place] Planning/Counseling - Complete  Palliative Care Screening - Not Applicable  Medication Review Press photographer) Complete Complete  PCP or Specialist appointment within 3-5 days of discharge Complete -  [place] or [place] Complete -  SW Recovery Care/Counseling Consult Complete -  Palliative Care Screening Not Applicable -  [place] Complete -  Some recent data might be hidden   Emeterio Reeve, LCSW Clinical Social Worker

## 2021-06-30 NOTE — Plan of Care (Signed)

## 2021-06-30 NOTE — Progress Notes (Addendum)
° °  Palliative Medicine Inpatient Follow Up Note  HPI:  Per intake H&P --> 66 y.o. male with medical history significant of hypertension, atrial fibrillation, congestive heart failure, CVA, ESRD on HD, and prostate cancer. Recently d/c from hospital to SNF and readmitted with acute respiratory failure due to overload. On prior admission, found to have chronic non-healed Lt femur facture despite ORIF.   Palliative care has been asked to get involved to help address goals of care in the setting of chronic illness.  Today's Discussion (06/30/2021):  *Please note that this is a verbal dictation therefore any spelling or grammatical errors are due to the "[place] One" system interpretation.  Chart reviewed inclusive of vital signs, progress notes, laboratory results, and diagnostic images.   I met with Richard Hall at bedside this morning. He was comfortable and in NAD. I asked him if he was in pain, nauseated, or feeling short of breath all of which he denied.   We reviewed the plan for short term rehabilitation to see how much improvement Richard Hall can make during this time.   Questions and concerns addressed   Palliative Support Provided.   Objective Assessment: Vital Signs Vitals:   06/30/21 0328 06/30/21 0757  BP: (!) 101/55 107/66  Pulse: 68 67  Resp: 19 17  Temp: 97.8 F (36.6 C) 98.1 F (36.7 C)  SpO2: 99% (!) 88%    Intake/Output Summary (Last 24 hours) at 06/30/2021 0805 Last data filed at 06/29/2021 1857 Gross per 24 hour  Intake 100 ml  Output --  Net 100 ml   Last Weight  Most recent update: 06/29/2021  5:47 AM    Weight  121 kg (266 lb 12.1 oz)            Gen:  Frail elderly Caucasian M  HEENT: moist mucous membranes CV: Regular rate and irregular rhythm PULM: On 3LPM [place], (+) rhonchi ABD: soft/nontender EXT: No edema Neuro: Alert and oriented x3   SUMMARY OF RECOMMENDATIONS   DNAR/DNI   MOST / DNR Form Completed, paper copy placed onto the chart electric  copy can be found in Vynca   Patient would like to try skilled nursing, at this time   [facility] - Request OP Palliative follow up  Ongoing Palliative support   MDM - Moderate ______________________________________________________________________________________ [place] Team Team Cell Phone: [phone] Please utilize secure chat with additional questions, if there is no response within 30 minutes please call the above phone number  Palliative Medicine Team providers are available by phone from 7am to 7pm daily and can be reached through the team cell phone.  Should this patient require assistance outside of these hours, please call the patient's attending physician.

## 2021-06-30 NOTE — Progress Notes (Signed)
[facility] Progress Note   Subjective: Seen in bed. Needs to be up in chair. No C/Os. Flat affect. HD tomorrow on schedule.   Objective Vitals:   06/29/21 2304 06/30/21 0328 06/30/21 0757 06/30/21 1205  BP: (!) 107/59 (!) 101/55 107/66 (!) 98/55  Pulse: 72 68 67 68  Resp:  19 17 14   Temp:  97.8 F (36.6 C) 98.1 F (36.7 C) (!) 97.4 F (36.3 C)  TempSrc:  Oral Oral Axillary  SpO2:  99% (!) 88% 100%  Weight:      Height:       Physical Exam General: Chronically ill appearing male in NAD Heart: S1,S2 irreg, irreg. Afib on monitor.  Lungs: CTAB Anteriorly Abdomen: obese NABS Extremities: 1+ BLE edema LLE no edema RLE Dialysis Access: L AVF + T/B     Additional Objective Labs: Basic Metabolic Panel: Recent Labs  Lab 06/28/21 0112 06/29/21 0051 06/30/21 0303  NA 127* 130* 128*  K 4.5 3.8 3.9  CL 90* 93* 91*  CO2 27 29 26   GLUCOSE 154* 156* 194*  BUN 46* 30* 43*  CREATININE 5.54* 3.90* 5.31*  CALCIUM 8.0* 7.9* 8.2*  PHOS 4.9* 3.8 4.7*   Liver Function Tests: Recent Labs  Lab 06/28/21 0112 06/29/21 0051 06/30/21 0303  ALBUMIN 1.8* 1.8* 1.9*   No results for input(s): LIPASE, AMYLASE in the last 168 hours. CBC: Recent Labs  Lab 06/24/21 0056 06/25/21 0609 06/26/21 0231 06/27/21 0113 06/28/21 0112 06/29/21 0423 06/30/21 0303  WBC 7.9   < > 7.9 6.2 6.6 6.5 8.7  NEUTROABS 6.1  --   --   --   --   --   --   HGB 6.6*   < > 8.1* 7.8* 7.3* 8.1* 7.8*  HCT 22.4*   < > 27.2* 25.4* 23.8* 26.0* 26.0*  MCV 97.8   < > 96.1 96.2 96.0 94.5 96.7  PLT 150   < > 131* 130* 141* 160 185   < > = values in this interval not displayed.   Blood Culture    Component Value Date/Time   SDES BLOOD RIGHT ARM 06/15/2021 0919   SPECREQUEST  06/15/2021 0919    BOTTLES DRAWN AEROBIC AND ANAEROBIC Blood Culture adequate volume   CULT  06/15/2021 0919    NO GROWTH 5 DAYS Performed at [facility], [place] [address] [place], [place] [address]    REPTSTATUS  06/20/2021 FINAL 06/15/2021 0919    Cardiac Enzymes: No results for input(s): CKTOTAL, CKMB, CKMBINDEX, TROPONINI in the last 168 hours. CBG: Recent Labs  Lab 06/29/21 1245 06/29/21 1716 06/29/21 1952 06/30/21 0759 06/30/21 1207  GLUCAP 234* 206* 167* 213* 239*   Iron Studies: No results for input(s): IRON, TIBC, TRANSFERRIN, FERRITIN in the last 72 hours. @lablastinr3 @ Studies/Results: No results found. Medications:   ceFAZolin (ANCEF) IV Stopped (06/28/21 1657)    sodium chloride   Intravenous Once   amitriptyline  100 mg Oral QHS   apixaban  2.5 mg Oral BID   atorvastatin  80 mg Oral QPM   bisacodyl  10 mg Oral Once   chlorhexidine  15 mL Mouth Rinse BID   Chlorhexidine Gluconate Cloth  6 each Topical Q0600   darbepoetin (ARANESP) injection - DIALYSIS  200 mcg Intravenous Q Wed-HD   insulin aspart  0-6 Units Subcutaneous TID WC   insulin glargine-yfgn  10 Units Subcutaneous QHS   lactulose  30 g Oral TID   levothyroxine  100 mcg Oral QAC breakfast  lidocaine-prilocaine  1 application Topical Q M,W,F   mouth rinse  15 mL Mouth Rinse q12n4p   metoprolol tartrate  12.5 mg Oral BID   midodrine  10 mg Oral TID WC   multivitamin with minerals  1 tablet Oral Daily   pantoprazole (PROTONIX) IV  40 mg Intravenous Q12H   polyethylene glycol  34 g Oral TID   senna-docusate  2 tablet Oral BID   sertraline  100 mg Oral Daily     HD orders: MWF [place] 4 hrs 180NR3 450/Autoflow 1.5 116.5 kg 2.0K/2.0 Ca UFP 2 AVF -No Heparin - mircera 225 mcg every 2 weeks - last received 200 mcg on 12/14  - venofer 100 mg (ordered for 10 doses-not started)  - no vdra      Assessment/Plan: AHRF. Volume overload. Pulm edema on admit. Continue volume removal with HD. Now below OP dry weight. Will have lower EDW at discharge  ~117kg.  Recent COVID pna - was on vent 1/05 to 1/08. ?aspiration PNA  ESRD - HD MWF.  Continue on schedule. Next HD 07/01/2021 BP/ volume - Soft blood pressures  this admission. [place] increased 10mg  TID. Net UF with HD 06/28/2021  3 liters. No post wt. Continue to lower volume as tolerated.  Anemia ckd - Hgb 7.3 today.  S/PLAN: 2 units prbcs 1/23. Continue Aranesp 200 q Wed. Iron replete. Rec'd Ferrlecit 250mg  x4.  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 Cont auryxia ac.  Atrial Fibrillation w/ RVR - Rate controlled. On Eliquis, metoprolol  MSSA bacteremia. Prior to this admission. On cefazolin stop date 07/17/21.  New cavitary lesion in RUL on CT. Seen by PCCM and felt consistent with septic pulmonary embolus. Plan for 6 weeks Augmentin at discharge  Dispo: D/C to SNF when stable.  QOL: Multiple medical issues. HD has prolonged his life but doubt it is now contributing to quality of life. Palliative Care consulted. Patient is open to hospice but plans in place to talk to son 06/29/2021. Son and patient has decided to try once more as OP at SNF. If he returns to hospital again, they will consider hospice.  Appreciate assistance from Palliative Care.   Charles Andringa H. Laasia Arcos NP-C 06/30/2021, 1:35 PM  [employer] [phone]

## 2021-06-30 NOTE — Progress Notes (Signed)
PROGRESS NOTE                                                                                                                                                                                                             Patient Demographics:    Richard Hall, is a 66 y.o. male, DOB - 03-09-1956, NLZ:[id number]  Outpatient Primary MD for the patient is [name], MD    LOS - 15  Admit date - 06/15/2021    Chief Complaint  Patient presents with   Shortness of Breath       Brief Narrative (HPI from H&P)      Richard Hall is a 66 y.o. male with medical history significant of hypertension, atrial fibrillation, congestive heart failure, CVA, ESRD on HD, and prostate cancer presents with complaints of shortness of breath.  Patient had just recently been hospitalized from 12/30-1/13 after initially presenting with septic shock related to MSSA bacteremia and COVID-19 viral pneumonia.  During his hospital stay patient required intubation.  ID had been consulted and patient was recommended to complete 6 weeks of cefazolin to be completed on 07/17/2021.  He went to the rehab facility but soon was sent back to the hospital for hypoxia, in the ER his work-up was suggestive of possible continued pneumonia, troponin, cardiology was consulted and he was admitted for further care.   Subjective:   Patient denies any complaints, no significant events overnight, patient had bowel movement earlier today after multiple laxatives over last few days, it does appear to be black in color and Hemoccult positive.     Assessment  & Plan :   Acute Hypoxic Resp. Failure  - due to Acute on chronic diastolic CHF in the presence of severe MR, EF 60% on recent echo with NSTEMI in a patient with ESRD and recent COVID-19 pneumonia with a new cavitary lesion on CT scan ?  Chronic intermittent microaspiration   Acute on chronic diastolic CHF -Volume  management with hemodialysis.  cavitary pneumonia - no sepsis this admission, however does have leukocytosis with a cavitary lesion on CT scan raising the suspicion for possible underlying aspiration, SLP following, also continue fluid removal through HD, renal and cardiology on board will also request pulmonary to opine on his cavitary lung lesion. CRP and procalcitonin also point towards a bacterial infectious etiology  as well.  He was briefly placed on Unasyn with excellent clinical improvement, cultures here this admission negative thus far.  Have been consulted ID discussed the plan with pulmonary on 06/20/2021 plan is to continue cefazolin till 07/17/2021 thereafter outpatient ID and pulmonary follow-up.   - Encouraged to sit up in chair in the daytime use I-S and flutter valve for pulmonary toiletry.  Will advance activity and titrate down oxygen as possible.   - He is on cefazolin for recent MSSA bacteremia with stop date of 07/17/2021 (pharm confirmed)   Prostate cancer with metastasis to the bone -He is being followed by Dr. Tresa Moore from urology, Dr. Alen Blew from oncology   ESRD.   - Renal on board for fluid removal through HD.  Recent MSSA bacteremia.   - Cefazolin with a stop date of 07/17/2021 seen by ID this admission.  Dysphagia.   - Speech therapy following currently on dysphagia 2 diet.  Kindly see #1 above.  AOCD.  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   -Concern for chronic blood loss anemia, GI consulted, patient is high risk for procedures. -Resumed on Eliquis today at 2.5 mg p.o. twice daily, monitor CBC closely -On Aranesp and IV iron per renal -Patient with profound anemia, requiring multiple  transfusions, despite being on Aranesp and IV iron per renal, eulogy can be multifactorial, may be related to mild chronic blood loss anemia, but as well patient with prostate cancer and bone metastasis which is  contributing as well. -Patient with severe constipation, he required a lot of laxatives, but finally had a bowel movement today, which was dark in color, and Hemoccult positive -Globin trending down, it is 7.8 today, likely will need another unit transfusion tomorrow -I have discussed with GI to evaluate again as he will  will need anticoagulation for his A. fib, but as well with chronic blood loss anemia likely due to chronic GI bleed.  Obesity with OSA. -  BMI 35 follow with PCP for weight loss nightly CPAP.  Counseled on compliance  Recent left ankle fracture.  -  Nonweightbearing left leg per last admission, outpatient orthopedics follow-up.  PT OT.  Hypothyroidism.   -On Synthroid   Chronic A. fib [place] vas 2 score of greater than 4.   - On Eliquis continue low-dose beta-blocker as tolerated by blood pressure.    Hypotension.   -Blood pressure has stabilized and improving titrate off midodrine.  Dyslipidemia.   -On Statin  Hyponatremia - management with dialysis.  History of prostate cancer - Continue Xtandi.  Elevated troponin.   - Likely demand ischemia in the setting of ESRD, demand mismatch caused by hypoxia from #1 above, chest pain-free.  Cardiology on board, on Eliquis, statin, low-dose beta-blocker as tolerated by blood pressure and monitor  Severe MR.  -  Outpatient cardiology follow-up.  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  History of COVID-19 infection in the past, still COVID-19 positive.  Repeat test on 06/25/2021 is still positive but cycle threshold is 39  i.e. he is not contagious and this is not an active infection.    DM type II.  -  On Lantus and sliding scale.  Monitor and adjust  Lab Results  Component Value Date  HGBA1C 6.1 (H)  06/01/2021    CBG (last 3)  Recent Labs    06/29/21 1952 06/30/21 0759 06/30/21 1207  GLUCAP 167* 213* 239*      Goals of care -Patient with very poor functional status, weak, deconditioned, HD dependent, multiple comorbidities, and with metastatic prostate cancer, palliative medicine has been consulted to address goals of care.  Patient is DNR, continue with current management, with plan to DC to SNF, but if he continues to deteriorate or require frequent admissions then would consider hospice.      Condition - Extremely Guarded  Family Communication  : None at bedside  Code Status : DNR  Consults  : Cardiology, Pulmonary, renal, Palliative   PUD Prophylaxis :    Procedures  :     CT Scan abdomen pelvis repeat on 06/25/2021 without contrast.  No evidence of retroperitoneal bleed.  CT scan - 1. The appearance of the chest suggest congestive heart failure, as above. 2. In addition, there is a new thick-walled cavitary nodule measuring 1.4 x 1.2 cm in the right upper lobe. This is potentially infectious or inflammatory in etiology, however, underlying neoplasm is not excluded. Short-term follow-up repeat noncontrast chest CT is recommended in 2-3 months to ensure the stability or regression of this lesion. 3. Mildly enlarged right paratracheal lymph node, nonspecific in the setting of presumed congestive heart failure, but concerning in light of the right upper lobe pulmonary nodule. Attention at time of repeat CT examination is recommended to ensure stability/regression. 4. Diffuse sclerotic lesions noted throughout the visualized axial and appendicular skeleton, presumably reflective of widespread osseous metastasis in this patient with history of prostate cancer. 5. Aortic atherosclerosis, in addition to left main and three-vessel coronary artery disease. Please note that although the presence of coronary artery calcium documents the presence of coronary artery disease, the severity of  this disease and any potential stenosis cannot be assessed on this non-gated CT examination. Assessment for potential risk factor modification, dietary therapy or pharmacologic therapy may be warranted, if clinically indicated. 6. There are calcifications of the aortic valve. Echocardiographic correlation for evaluation of potential valvular dysfunction may be warranted if clinically indicated. Aortic Atherosclerosis (ICD10-I70.0).      Disposition Plan  :    Status is: Inpatient  Remains inpatient appropriate because: Acute hypoxic respiratory failure  DVT Prophylaxis  :    apixaban (ELIQUIS) tablet 2.5 mg Start: 06/27/21 1000 apixaban (ELIQUIS) tablet 2.5 mg     Lab Results  Component Value Date   PLT 185 06/30/2021    Diet :  Diet Order             Diet renal with fluid restriction Fluid restriction: 1200 mL Fluid; Room service appropriate? Yes; Fluid consistency: Thin  Diet effective now                    Inpatient Medications  Scheduled Meds:  sodium chloride   Intravenous Once   amitriptyline  100 mg Oral QHS   apixaban  2.5 mg Oral BID   atorvastatin  80 mg Oral QPM   bisacodyl  10 mg Oral Once   chlorhexidine  15 mL Mouth Rinse BID   Chlorhexidine Gluconate Cloth  6 each Topical Q0600   darbepoetin (ARANESP) injection - DIALYSIS  200 mcg Intravenous Q Wed-HD   insulin aspart  0-6 Units Subcutaneous TID WC   insulin glargine-yfgn  10 Units Subcutaneous QHS   lactulose  30 g Oral TID  levothyroxine  100 mcg Oral QAC breakfast   lidocaine-prilocaine  1 application Topical Q M,W,F   mouth rinse  15 mL Mouth Rinse q12n4p   metoprolol tartrate  12.5 mg Oral BID   midodrine  10 mg Oral TID WC   multivitamin with minerals  1 tablet Oral Daily   pantoprazole (PROTONIX) IV  40 mg Intravenous Q12H   polyethylene glycol  34 g Oral TID   senna-docusate  2 tablet Oral BID   sertraline  100 mg Oral Daily   Continuous Infusions:   ceFAZolin (ANCEF) IV Stopped  (06/28/21 1657)   PRN Meds:.acetaminophen **OR** acetaminophen, albuterol, calcium carbonate, diphenhydrAMINE, diphenoxylate-atropine, guaiFENesin, ondansetron (ZOFRAN) IV      Phillips Climes M.D on 06/30/2021 at 2:47 PM  To page go to [email or web address]   [facility] -  Office  [phone]  See all Orders from today for further details    Objective:   Vitals:   06/29/21 2304 06/30/21 0328 06/30/21 0757 06/30/21 1205  BP: (!) 107/59 (!) 101/55 107/66 (!) 98/55  Pulse: 72 68 67 68  Resp:  19 17 14   Temp:  97.8 F (36.6 C) 98.1 F (36.7 C) (!) 97.4 F (36.3 C)  TempSrc:  Oral Oral Axillary  SpO2:  99% (!) 88% 100%  Weight:      Height:        Wt Readings from Last 3 Encounters:  06/29/21 121 kg  06/14/21 117.6 kg  07/26/20 125.3 kg     Intake/Output Summary (Last 24 hours) at 06/30/2021 1447 Last data filed at 06/29/2021 1857 Gross per 24 hour  Intake 100 ml  Output --  Net 100 ml       Physical Exam  Awake Alert, Oriented X 3, pale, deconditioned, chronically ill-appearing, Symmetrical Chest wall movement, Good air movement bilaterally, CTAB RRR,No Gallops,Rubs or new Murmurs, No Parasternal Heave +ve B.Sounds, Abd Soft, No tenderness, No rebound - guarding or rigidity. No Cyanosis, Clubbing or edema, No new Rash or bruise         RN pressure injury documentation: Pressure Injury 06/11/21 Coccyx Medial Stage 2 -  Partial thickness loss of dermis presenting as a shallow open injury with a red, pink wound bed without slough. (Active)  06/11/21 2159  Location: Coccyx  Location Orientation: Medial  Staging: Stage 2 -  Partial thickness loss of dermis presenting as a shallow open injury with a red, pink wound bed without slough.  Wound Description (Comments):   Present on Admission:      Data Review:   Recent Labs  Lab 06/24/21 0056 06/25/21 0609 06/26/21 0231 06/27/21 0113 06/28/21 0112 06/29/21 0423 06/30/21 0303  WBC 7.9   < > 7.9  6.2 6.6 6.5 8.7  HGB 6.6*   < > 8.1* 7.8* 7.3* 8.1* 7.8*  HCT 22.4*   < > 27.2* 25.4* 23.8* 26.0* 26.0*  PLT 150   < > 131* 130* 141* 160 185  MCV 97.8   < > 96.1 96.2 96.0 94.5 96.7  MCH 28.8   < > 28.6 29.5 29.4 29.5 29.0  MCHC 29.5*   < > 29.8* 30.7 30.7 31.2 30.0  RDW 21.0*   < > 20.6* 20.8* 20.3* 21.2* 20.9*  LYMPHSABS 0.7  --   --   --   --   --   --   MONOABS 0.9  --   --   --   --   --   --   EOSABS 0.1  --   --   --   --   --   --  BASOSABS 0.0  --   --   --   --   --   --    < > = values in this interval not displayed.    Recent Labs  Lab 06/25/21 1208 06/26/21 0231 06/27/21 0113 06/28/21 0112 06/29/21 0051 06/30/21 0303  NA  --  125* 129* 127* 130* 128*  K  --  4.6 4.4 4.5 3.8 3.9  CL  --  89* 93* 90* 93* 91*  CO2  --  25 28 27 29 26   GLUCOSE  --  167* 154* 154* 156* 194*  BUN  --  40* 30* 46* 30* 43*  CREATININE  --  5.40* 4.17* 5.54* 3.90* 5.31*  CALCIUM  --  8.1* 7.9* 8.0* 7.9* 8.2*  ALBUMIN  --  2.0* 1.9* 1.8* 1.8* 1.9*  DDIMER 1.29*  --   --   --   --   --   INR 1.7*  --   --   --   --   --      Radiology Reports DG Swallowing Func-Speech Pathology  Result Date: 06/27/2021 Table formatting from the original result was not included. Objective Swallowing Evaluation: Type of Study: MBS-Modified Barium Swallow Study  Patient Details Name: Neeko Pharo MRN: [id number] Date of Birth: 12/21/1955 Today's Date: 06/27/2021 Time: SLP Start Time (ACUTE ONLY): 1200 -SLP Stop Time (ACUTE ONLY): 2637 SLP Time Calculation (min) (ACUTE ONLY): 15 min Past Medical History: Past Medical History: Diagnosis Date  A-V fistula ([place])   left arm  Anemia   Arthritis   CHF (congestive heart failure) ([place])   CVA (cerebral vascular accident) ([place]) 2010  Diabetes mellitus with end-stage renal disease ([place])   Dyalisis pt.  Diabetic neuropathy ([facility])   Bilateral feet  Dyslipidemia   Dysrhythmia   A-fib  Essential hypertension   Hepatitis   childhood  History of blood transfusion    Hypothyroidism (acquired)   OSA on CPAP   Prostate cancer [facility])   Uses wheelchair  Past Surgical History: Past Surgical History: Procedure Laterality Date  APPENDECTOMY    COLONOSCOPY    FEMUR IM NAIL Left 05/20/2019  Procedure: Intramedullary (Im) Retrograde Femoral Nailing for impending pathologic fracture;  Surgeon: Altamese Burney, MD;  Location: [place];  Service: Orthopedics;  Laterality: Left;  ORIF FEMUR FRACTURE Left 05/20/2019  Procedure: OPEN REDUCTION INTERNAL FIXATION (ORIF) DISTAL FEMUR FRACTURE with intercondylar extension;  Surgeon: Altamese Wall, MD;  Location: [place];  Service: Orthopedics;  Laterality: Left;  TRANSURETHRAL RESECTION OF BLADDER TUMOR N/A 01/07/2019  Procedure: CYSTOSCOPY/ EVEACUATION CLOT;  Surgeon: Bjorn Loser, MD;  Location: [facility];  Service: Urology;  Laterality: N/A;  TRANSURETHRAL RESECTION OF PROSTATE N/A 07/26/2020  Procedure: TRANSURETHRAL RESECTION OF THE PROSTATE (TURP);  Surgeon: Alexis Frock, MD;  Location: [facility];  Service: Urology;  Laterality: N/A;  15 MINS HPI: 66 y.o. male with medical history significant of hypertension, atrial fibrillation, congestive heart failure, CVA, ESRD on HD, and prostate cancer. Recently d/c (admitted 12/20-1/13)from hospital to SNF and readmitted with acute respiratory failure due to overload. Patient had been seen by ST services during previous admission and at that time was tolerating a Dys 2, thin liquids diet; ST continuing to f/u for diet progression.  Subjective: awake and alert but appears confused and apathetic  Recommendations for follow up therapy are one component of a multi-disciplinary discharge planning process, led by the attending physician.  Recommendations may be updated based on patient status, additional functional criteria and insurance authorization.  Assessment / Plan / Recommendation Clinical Impressions 06/27/2021 Clinical Impression Pt exhibits a suspected primary esophageal dysphagia. Transit time with solid  affected by missing dentition rather than impairment. Thin liquid was penetrated and remained in vestibule x 1 and minimal vallecular residue both of which are normal and typical findings. His esophagus was scanned revealing hesitation and slow transit with pill with statis ascending esophagus. Compensatory strategies were discussed with pt to mitigate risks. Recommend he continue Dys 3, thin liquid, straws allowed, single pill with thin and remain upright 45 min after meals. Will follow up for esophageal education x 1. SLP Visit Diagnosis (No Data) Attention and concentration deficit following -- Frontal lobe and executive function deficit following -- Impact on safety and function Mild aspiration risk   Treatment Recommendations 06/27/2021 Treatment Recommendations Therapy as outlined in treatment plan below   Prognosis 06/27/2021 Prognosis for Safe Diet Advancement Good Barriers to Reach Goals -- Barriers/Prognosis Comment -- Diet Recommendations 06/27/2021 SLP Diet Recommendations Dysphagia 3 (Mech soft) solids;Thin liquid Liquid Administration via Cup;Straw Medication Administration Whole meds with liquid Compensations Slow rate;Small sips/bites Postural Changes Seated upright at 90 degrees;Remain semi-upright after after feeds/meals (Comment)   Other Recommendations 06/27/2021 Recommended Consults Consider esophageal assessment Oral Care Recommendations Oral care BID Other Recommendations -- Follow Up Recommendations No SLP follow up Assistance recommended at discharge None Functional Status Assessment -- Frequency and Duration  06/27/2021 Speech Therapy Frequency (ACUTE ONLY) min 1 x/week Treatment Duration 1 week   Oral Phase 06/27/2021 Oral Phase WFL Oral - Pudding Teaspoon -- Oral - Pudding Cup -- Oral - Honey Teaspoon -- Oral - Honey Cup -- Oral - Nectar Teaspoon -- Oral - Nectar Cup -- Oral - Nectar Straw -- Oral - Thin Teaspoon -- Oral - Thin Cup -- Oral - Thin Straw -- Oral - Puree -- Oral - Mech Soft --  Oral - Regular -- Oral - Multi-Consistency -- Oral - Pill -- Oral Phase - Comment --  Pharyngeal Phase 06/27/2021 Pharyngeal Phase WFL Pharyngeal- Pudding Teaspoon -- Pharyngeal -- Pharyngeal- Pudding Cup -- Pharyngeal -- Pharyngeal- Honey Teaspoon -- Pharyngeal -- Pharyngeal- Honey Cup -- Pharyngeal -- Pharyngeal- Nectar Teaspoon -- Pharyngeal -- Pharyngeal- Nectar Cup -- Pharyngeal -- Pharyngeal- Nectar Straw -- Pharyngeal -- Pharyngeal- Thin Teaspoon -- Pharyngeal -- Pharyngeal- Thin Cup -- Pharyngeal -- Pharyngeal- Thin Straw -- Pharyngeal -- Pharyngeal- Puree -- Pharyngeal -- Pharyngeal- Mechanical Soft -- Pharyngeal -- Pharyngeal- Regular -- Pharyngeal -- Pharyngeal- Multi-consistency -- Pharyngeal -- Pharyngeal- Pill -- Pharyngeal -- Pharyngeal Comment --  Cervical Esophageal Phase  06/27/2021 Cervical Esophageal Phase WFL Pudding Teaspoon -- Pudding Cup -- Honey Teaspoon -- Honey Cup -- Nectar Teaspoon -- Nectar Cup -- Nectar Straw -- Thin Teaspoon -- Thin Cup -- Thin Straw -- Puree -- Mechanical Soft -- Regular -- Multi-consistency -- Pill -- Cervical Esophageal Comment -- Houston Siren 06/27/2021, 1:11 PM                       Patient ID: [name], male   DOB: [birth year], 66 y.o.   MRN: [id number] Patient ID: Azarius Lambson, male   DOB: 10/01/1955, 66 y.o.   MRN: [id number] Patient ID: Wasil Wolke, male   DOB: April 05, 1956, 66 y.o.   MRN: [id number] Patient ID: Jeral Zick, male   DOB: August 23, 1955, 66 y.o.   MRN: [id number] Patient ID: Reco Shonk, male   DOB: February 23, 1956, 66 y.o.   MRN: [id number]

## 2021-07-01 DIAGNOSIS — D500 Iron deficiency anemia secondary to blood loss (chronic): Secondary | ICD-10-CM

## 2021-07-01 LAB — GLUCOSE, CAPILLARY
Glucose-Capillary: 155 mg/dL — ABNORMAL HIGH (ref 70–99)
Glucose-Capillary: 114 mg/dL — ABNORMAL HIGH (ref 70–99)
Glucose-Capillary: 163 mg/dL — ABNORMAL HIGH (ref 70–99)
Glucose-Capillary: 193 mg/dL — ABNORMAL HIGH (ref 70–99)

## 2021-07-01 LAB — RENAL FUNCTION PANEL
Albumin: 1.9 g/dL — ABNORMAL LOW (ref 3.5–5.0)
Anion gap: 10 (ref 5–15)
BUN: 56 mg/dL — ABNORMAL HIGH (ref 8–23)
CO2: 25 mmol/L (ref 22–32)
Calcium: 8 mg/dL — ABNORMAL LOW (ref 8.9–10.3)
Chloride: 92 mmol/L — ABNORMAL LOW (ref 98–111)
Creatinine, Ser: 6.33 mg/dL — ABNORMAL HIGH (ref 0.61–1.24)
GFR, Estimated: 9 mL/min — ABNORMAL LOW (ref >60)
Glucose, Bld: 197 mg/dL — ABNORMAL HIGH (ref 70–99)
Phosphorus: 5.3 mg/dL — ABNORMAL HIGH (ref 2.5–4.6)
Potassium: 4.1 mmol/L (ref 3.5–5.1)
Sodium: 127 mmol/L — ABNORMAL LOW (ref 135–145)

## 2021-07-01 LAB — CBC
HCT: 25.1 % — ABNORMAL LOW (ref 39.0–52.0)
Hemoglobin: 7.4 g/dL — ABNORMAL LOW (ref 13.0–17.0)
MCH: 28.6 pg (ref 26.0–34.0)
MCHC: 29.5 g/dL — ABNORMAL LOW (ref 30.0–36.0)
MCV: 96.9 fL (ref 80.0–100.0)
Platelets: 189 10*3/uL (ref 150–400)
RBC: 2.59 MIL/uL — ABNORMAL LOW (ref 4.22–5.81)
RDW: 20.8 % — ABNORMAL HIGH (ref 11.5–15.5)
WBC: 7.3 10*3/uL (ref 4.0–10.5)
nRBC: 0.8 % — ABNORMAL HIGH (ref 0.0–0.2)

## 2021-07-01 LAB — PREPARE RBC (CROSSMATCH)

## 2021-07-01 MED ORDER — SODIUM CHLORIDE 0.9 % IV SOLN: 100.0000 mL | INTRAVENOUS | Status: DC | PRN

## 2021-07-01 MED ORDER — PENTAFLUOROPROP-TETRAFLUOROETH EX AERO: 1.0000 "application " | INHALATION_SPRAY | CUTANEOUS | Status: DC | PRN

## 2021-07-01 MED ORDER — LIDOCAINE HCL (PF) 1 % IJ SOLN
5.0000 mL | INTRAMUSCULAR | Status: DC | PRN
  Filled 2021-07-01: qty 5

## 2021-07-01 MED ORDER — LIDOCAINE-PRILOCAINE 2.5-2.5 % EX CREA
1.0000 "application " | TOPICAL_CREAM | CUTANEOUS | Status: DC | PRN
  Filled 2021-07-01: qty 5

## 2021-07-01 MED ORDER — SODIUM CHLORIDE 0.9% IV SOLUTION: Freq: Once | INTRAVENOUS | Status: DC

## 2021-07-01 MED ORDER — APIXABAN 2.5 MG PO TABS: 2.5000 mg | ORAL_TABLET | Freq: Two times a day (BID) | ORAL | Status: DC

## 2021-07-01 NOTE — Progress Notes (Signed)
Daily Rounding Note  07/01/2021, 3:08 PM  LOS: 16 days   SUBJECTIVE:   Chief complaint:    Ongoing anemia. Hospitalist requesting GI reinvolvement for ongoing anemia. Pt had GI consult on 06/25/2021 for anemia.  See that note for details.  Hx chronic iron deficiency anemia that was worked up in [place] with colonoscopy, EGD, VCE 019 and 2020.  Notes mention there was a single AVM but not described as bleeding.  He has end-stage renal disease and has been treated for anemia of CKD with parenteral and oral iron as well as PRBC transfusion..  Family was also advised that he may have element of hemolytic anemia due to mitral regurgitation Of in patient's overall frail health due to significant morbidities, high risk for sedation and recent admissions, Dr. Tarri Glenn did not choose to pursue repeat endoscopic evaluations.  This suggested considering push enteroscopy at some point if clinically appropriate.  She suggested transfusions as needed  Hgb remains low ranging from low 8 range last week, 7.3 on 1/27, transfused 1 PRBC to 8.1 on 1/28.  48 h later, today is down to 7.4. His thrombocytopenia, nadir platelets 130 have rebounded, 189 today.  OBJECTIVE:         Vital signs in last 24 hours:    Temp:  [97.3 F (36.3 C)-97.9 F (36.6 C)] 97.7 F (36.5 C) (01/30 1344) Pulse Rate:  [64-68] 68 (01/30 1344) Resp:  [15-20] 18 (01/30 1344) BP: (85-122)/(56-83) 85/71 (01/30 1344) SpO2:  [91 %-100 %] 100 % (01/30 1344) Weight:  [118.3 kg-123.6 kg] 118.3 kg (01/30 0912) Last BM Date: 06/30/21 Filed Weights   06/29/21 0546 07/01/21 0500 07/01/21 0912  Weight: 121 kg 123.6 kg 118.3 kg   General: Ill looking, comfortable, soft voice   Heart: Irregular, rate in 60s Chest: No labored breathing or cough.  Lungs clear on anterior auscultation. Abdomen: Obese, soft without tenderness.  Active bowel sounds. Rectal: There is a moderate amount  of loose not watery black stool pooling beneath his scrotum.  Distant tests FOBT positive.  Visually melenic but does not have the typical melena odor. Extremities: Lower extremity edema. Neuro/Psych: Oriented x3.  Intake/Output from previous day: No intake/output data recorded.  Intake/Output this shift: Total I/O In: 715 [I.V.:400; Blood:315] Out: -   Lab Results: Recent Labs    06/29/21 0423 06/30/21 0303 07/01/21 0040  WBC 6.5 8.7 7.3  HGB 8.1* 7.8* 7.4*  HCT 26.0* 26.0* 25.1*  PLT 160 185 189   BMET Recent Labs    06/29/21 0051 06/30/21 0303 07/01/21 0040  NA 130* 128* 127*  K 3.8 3.9 4.1  CL 93* 91* 92*  CO2 29 26 25   GLUCOSE 156* 194* 197*  BUN 30* 43* 56*  CREATININE 3.90* 5.31* 6.33*  CALCIUM 7.9* 8.2* 8.0*   LFT Recent Labs    06/29/21 0051 06/30/21 0303 07/01/21 0040  ALBUMIN 1.8* 1.9* 1.9*   PT/INR No results for input(s): LABPROT, INR in the last 72 hours. Hepatitis Panel No results for input(s): HEPBSAG, HCVAB, HEPAIGM, HEPBIGM in the last 72 hours.  Studies/Results: No results found.  Scheduled Meds:  sodium chloride   Intravenous Once   sodium chloride   Intravenous Once   amitriptyline  100 mg Oral QHS   apixaban  2.5 mg Oral BID   atorvastatin  80 mg Oral QPM   bisacodyl  10 mg Oral Once   chlorhexidine  15 mL Mouth Rinse BID  Chlorhexidine Gluconate Cloth  6 each Topical Q0600   darbepoetin (ARANESP) injection - DIALYSIS  200 mcg Intravenous Q Wed-HD   insulin aspart  0-6 Units Subcutaneous TID WC   insulin glargine-yfgn  10 Units Subcutaneous QHS   lactulose  30 g Oral TID   levothyroxine  100 mcg Oral QAC breakfast   lidocaine-prilocaine  1 application Topical Q M,W,F   mouth rinse  15 mL Mouth Rinse q12n4p   metoprolol tartrate  12.5 mg Oral BID   midodrine  10 mg Oral TID WC   multivitamin with minerals  1 tablet Oral Daily   pantoprazole (PROTONIX) IV  40 mg Intravenous Q12H   [START ON 07/02/2021] polyethylene glycol   17 g Oral BID   senna-docusate  2 tablet Oral BID   sertraline  100 mg Oral Daily   Continuous Infusions:   ceFAZolin (ANCEF) IV 2 g (07/01/21 1419)   PRN Meds:.acetaminophen **OR** acetaminophen, albuterol, calcium carbonate, diphenhydrAMINE, diphenoxylate-atropine, guaiFENesin, ondansetron (ZOFRAN) IV   ASSESMENT:     Anemia.   Acute on chronic.  PRBCs x 5 (5th today). Melenic looking stool is FOBT +     ESRD    Prostate cancer.    Cavitary pneumonia, bacterial.  On Ancef.  Recent treatment admission for COVID-19, MSSA sepsis. .    A fib.  Chads vasc 5.  Abixiban in place.     PLAN     Any chance of stopping Abixiban?  EGD versus SBE?,  Decision per Dr. Vivia Birmingham  07/01/2021, 3:08 PM Phone [phone]

## 2021-07-01 NOTE — Progress Notes (Signed)
PT Cancellation Note  Patient Details Name: Richard Hall MRN: [id number] DOB: 03/14/1956   Cancelled Treatment:    Reason Eval/Treat Not Completed: Patient at procedure or test/unavailable  Patient has been in dialysis all morning. Will attempt to see 1/31, an non-HD day.    [name], PT Acute Rehabilitation Services  Pager [phone] Office [phone]    [name] 07/01/2021, 1:25 PM

## 2021-07-01 NOTE — Progress Notes (Signed)
PROGRESS NOTE                                                                                                                                                                                                             Patient Demographics:    Richard Hall, is a 66 y.o. male, DOB - 02/21/1956, TXM:[id number]  Outpatient Primary MD for the patient is [name], MD    LOS - 16  Admit date - 06/15/2021    Chief Complaint  Patient presents with   Shortness of Breath       Brief Narrative (HPI from H&P)      Richard Hall is a 66 y.o. male with medical history significant of hypertension, atrial fibrillation, congestive heart failure, CVA, ESRD on HD, and prostate cancer presents with complaints of shortness of breath.  Patient had just recently been hospitalized from 12/30-1/13 after initially presenting with septic shock related to MSSA bacteremia and COVID-19 viral pneumonia.  During his hospital stay patient required intubation.  ID had been consulted and patient was recommended to complete 6 weeks of cefazolin to be completed on 07/17/2021.  He went to the rehab facility but soon was sent back to the hospital for hypoxia, in the ER his work-up was suggestive of possible continued pneumonia, troponin, cardiology was consulted and he was admitted for further care.   Subjective:   No significant events overnight, patient reports generalized weakness, fatigue and poor appetite, had a BM yesterday which was black in color and Hemoccult positive.     Assessment  & Plan :   Acute Hypoxic Resp. Failure  - due to Acute on chronic diastolic CHF in the presence of severe MR, EF 60% on recent echo with NSTEMI in a patient with ESRD and recent COVID-19 pneumonia with a new cavitary lesion on CT scan ?  Chronic intermittent microaspiration   Acute on chronic diastolic CHF -Volume management with hemodialysis.  Difficult to  manage in the setting of soft blood pressure  cavitary pneumonia - no sepsis this admission, however does have leukocytosis with a cavitary lesion on CT scan raising the suspicion for possible underlying aspiration, SLP following, also continue fluid removal through HD, renal and cardiology on board will also request pulmonary to opine on his cavitary lung lesion. CRP and procalcitonin also point towards a  bacterial infectious etiology as well.  He was briefly placed on Unasyn with excellent clinical improvement, cultures here this admission negative thus far.  Have been consulted ID discussed the plan with pulmonary on 06/20/2021 plan is to continue cefazolin till 07/17/2021 thereafter outpatient ID and pulmonary follow-up.   - Encouraged to sit up in chair in the daytime use I-S and flutter valve for pulmonary toiletry.  Will advance activity and titrate down oxygen as possible.   - He is on cefazolin for recent MSSA bacteremia with stop date of 07/17/2021 (pharm confirmed)   Prostate cancer with metastasis to the bone -He is being followed by Dr. Tresa Moore from urology, Dr. Alen Blew from oncology  ESRD.   - Renal on board for fluid removal through HD.  Recent MSSA bacteremia.   - Cefazolin with a stop date of 07/17/2021 seen by ID this admission.  Dysphagia.   - Speech therapy following currently on dysphagia 2 diet.  Kindly see #1 above.  AOCD.  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   -Concern for chronic blood loss anemia, GI consulted, patient is high risk for procedures. -Resumed on Eliquis today at 2.5 mg p.o. twice daily, monitor CBC closely -On Aranesp and IV iron per renal -Patient with profound anemia, requiring  multiple transfusions, despite being on Aranesp and IV iron per renal, eulogy can be multifactorial, may be related to mild chronic blood loss anemia, but as well patient with prostate cancer and bone metastasis which is  contributing as well. -Patient with BM yesterday after multiple days of constipation, had dark BM with Hemoccult positive -patient with chronic blood loss anemia the setting of known AVM, and on anticoagulation with Eliquis, requiring multiple PRBC transfusion, hemoglobin is low at 7.4 today, will transfuse 1 unit(total 5 units including this 1 during this hospital stay), cussed with GI, they will reevaluate today.  Obesity with OSA. -  BMI 35 follow with PCP for weight loss nightly CPAP.  Counseled on compliance  Recent left ankle fracture.  -  Nonweightbearing left leg per last admission, outpatient orthopedics follow-up.  PT OT.  Hypothyroidism.   -On Synthroid   Chronic A. fib [place] vas 2 score of greater than 4.   - On Eliquis continue low-dose beta-blocker as tolerated by blood pressure.    Hypotension.   -Blood pressure has stabilized and improving titrate off midodrine.  Dyslipidemia.   -On Statin  Hyponatremia - management with dialysis.  History of prostate cancer - Continue Xtandi.  Elevated troponin.   - Likely demand ischemia in the setting of ESRD, demand mismatch caused by hypoxia from #1 above, chest pain-free.  Cardiology on board, on Eliquis, statin, low-dose beta-blocker as tolerated by blood pressure and monitor  Severe MR.  -  Outpatient cardiology follow-up.  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  History of COVID-19 infection in the past, still COVID-19 positive.  Repeat test on 06/25/2021 is still positive but cycle threshold is 39  i.e. he is not contagious and this is not an active infection.    DM type II.  -  On Lantus and sliding scale.  Monitor and adjust  Lab Results  Component Value Date   HGBA1C 6.1 (H) 06/01/2021    CBG  (  last 3)  Recent Labs    06/30/21 2051 07/01/21 0816 07/01/21 1351  GLUCAP 154* 155* 114*      Goals of care -Patient with very poor functional status, weak, deconditioned, HD dependent, multiple comorbidities, and with metastatic prostate cancer, palliative medicine has been consulted to address goals of care.  Patient is DNR, continue with current management, with plan to DC to SNF, but if he continues to deteriorate or require frequent admissions then would consider hospice.      Condition - Extremely Guarded  Family Communication  : None at bedside  Code Status : DNR  Consults  : Cardiology, Pulmonary, renal, Palliative ,GI  PUD Prophylaxis :    Procedures  :     CT Scan abdomen pelvis repeat on 06/25/2021 without contrast.  No evidence of retroperitoneal bleed.  CT scan - 1. The appearance of the chest suggest congestive heart failure, as above. 2. In addition, there is a new thick-walled cavitary nodule measuring 1.4 x 1.2 cm in the right upper lobe. This is potentially infectious or inflammatory in etiology, however, underlying neoplasm is not excluded. Short-term follow-up repeat noncontrast chest CT is recommended in 2-3 months to ensure the stability or regression of this lesion. 3. Mildly enlarged right paratracheal lymph node, nonspecific in the setting of presumed congestive heart failure, but concerning in light of the right upper lobe pulmonary nodule. Attention at time of repeat CT examination is recommended to ensure stability/regression. 4. Diffuse sclerotic lesions noted throughout the visualized axial and appendicular skeleton, presumably reflective of widespread osseous metastasis in this patient with history of prostate cancer. 5. Aortic atherosclerosis, in addition to left main and three-vessel coronary artery disease. Please note that although the presence of coronary artery calcium documents the presence of coronary artery disease, the severity of this disease and  any potential stenosis cannot be assessed on this non-gated CT examination. Assessment for potential risk factor modification, dietary therapy or pharmacologic therapy may be warranted, if clinically indicated. 6. There are calcifications of the aortic valve. Echocardiographic correlation for evaluation of potential valvular dysfunction may be warranted if clinically indicated. Aortic Atherosclerosis (ICD10-I70.0).      Disposition Plan  :    Status is: Inpatient  Remains inpatient appropriate because: Acute hypoxic respiratory failure  DVT Prophylaxis  :    apixaban (ELIQUIS) tablet 2.5 mg Start: 06/27/21 1000 apixaban (ELIQUIS) tablet 2.5 mg     Lab Results  Component Value Date   PLT 189 07/01/2021    Diet :  Diet Order             Diet renal with fluid restriction Fluid restriction: 1200 mL Fluid; Room service appropriate? Yes; Fluid consistency: Thin  Diet effective now                    Inpatient Medications  Scheduled Meds:  sodium chloride   Intravenous Once   sodium chloride   Intravenous Once   amitriptyline  100 mg Oral QHS   apixaban  2.5 mg Oral BID   atorvastatin  80 mg Oral QPM   bisacodyl  10 mg Oral Once   chlorhexidine  15 mL Mouth Rinse BID   Chlorhexidine Gluconate Cloth  6 each Topical Q0600   darbepoetin (ARANESP) injection - DIALYSIS  200 mcg Intravenous Q Wed-HD   insulin aspart  0-6 Units Subcutaneous TID WC   insulin glargine-yfgn  10 Units Subcutaneous QHS   lactulose  30 g Oral TID  levothyroxine  100 mcg Oral QAC breakfast   lidocaine-prilocaine  1 application Topical Q M,W,F   mouth rinse  15 mL Mouth Rinse q12n4p   metoprolol tartrate  12.5 mg Oral BID   midodrine  10 mg Oral TID WC   multivitamin with minerals  1 tablet Oral Daily   pantoprazole (PROTONIX) IV  40 mg Intravenous Q12H   [START ON 07/02/2021] polyethylene glycol  17 g Oral BID   senna-docusate  2 tablet Oral BID   sertraline  100 mg Oral Daily   Continuous  Infusions:   ceFAZolin (ANCEF) IV Stopped (06/28/21 1657)   PRN Meds:.acetaminophen **OR** acetaminophen, albuterol, calcium carbonate, diphenhydrAMINE, diphenoxylate-atropine, guaiFENesin, ondansetron (ZOFRAN) IV      Richard Hall M.D on 07/01/2021 at 2:09 PM  To page go to [email or web address]   [facility] -  Office  [phone]  See all Orders from today for further details    Objective:   Vitals:   07/01/21 1215 07/01/21 1230 07/01/21 1245 07/01/21 1344  BP: 114/64 113/68 110/70 (!) 85/71  Pulse:    68  Resp: 20 17 20 18   Temp: 97.6 F (36.4 C) 97.7 F (36.5 C) 97.7 F (36.5 C) 97.7 F (36.5 C)  TempSrc:    Oral  SpO2: 100% 100%  100%  Weight:      Height:        Wt Readings from Last 3 Encounters:  07/01/21 118.3 kg  06/14/21 117.6 kg  07/26/20 125.3 kg     Intake/Output Summary (Last 24 hours) at 07/01/2021 1409 Last data filed at 07/01/2021 1230 Gross per 24 hour  Intake 715 ml  Output --  Net 715 ml       Physical Exam  Awake Alert, Oriented X 3, patient is extremely frail, deconditioned, chronically ill-appearing, and appears much older than stated age. Symmetrical Chest wall movement, Good air movement bilaterally, CTAB RRR,No Gallops,Rubs or new Murmurs, No Parasternal Heave +ve B.Sounds, Abd Soft, No tenderness, No rebound - guarding or rigidity. No Cyanosis, Clubbing or edema, No new Rash or bruise          RN pressure injury documentation: Pressure Injury 06/11/21 Coccyx Medial Stage 2 -  Partial thickness loss of dermis presenting as a shallow open injury with a red, pink wound bed without slough. (Active)  06/11/21 2159  Location: Coccyx  Location Orientation: Medial  Staging: Stage 2 -  Partial thickness loss of dermis presenting as a shallow open injury with a red, pink wound bed without slough.  Wound Description (Comments):   Present on Admission:      Data Review:   Recent Labs  Lab 06/27/21 0113 06/28/21 0112  06/29/21 0423 06/30/21 0303 07/01/21 0040  WBC 6.2 6.6 6.5 8.7 7.3  HGB 7.8* 7.3* 8.1* 7.8* 7.4*  HCT 25.4* 23.8* 26.0* 26.0* 25.1*  PLT 130* 141* 160 185 189  MCV 96.2 96.0 94.5 96.7 96.9  MCH 29.5 29.4 29.5 29.0 28.6  MCHC 30.7 30.7 31.2 30.0 29.5*  RDW 20.8* 20.3* 21.2* 20.9* 20.8*    Recent Labs  Lab 06/25/21 1208 06/26/21 0231 06/27/21 0113 06/28/21 0112 06/29/21 0051 06/30/21 0303 07/01/21 0040  NA  --    < > 129* 127* 130* 128* 127*  K  --    < > 4.4 4.5 3.8 3.9 4.1  CL  --    < > 93* 90* 93* 91* 92*  CO2  --    < > 28 27 29 26  25  GLUCOSE  --    < > 154* 154* 156* 194* 197*  BUN  --    < > 30* 46* 30* 43* 56*  CREATININE  --    < > 4.17* 5.54* 3.90* 5.31* 6.33*  CALCIUM  --    < > 7.9* 8.0* 7.9* 8.2* 8.0*  ALBUMIN  --    < > 1.9* 1.8* 1.8* 1.9* 1.9*  DDIMER 1.29*  --   --   --   --   --   --   INR 1.7*  --   --   --   --   --   --    < > = values in this interval not displayed.     Radiology Reports No results found.   Patient ID: Richard Hall, male   DOB: [birth year], 66 y.o.   MRN: [id number] Patient ID: Richard Hall, male   DOB: July 01, 1955, 66 y.o.   MRN: [id number] Patient ID: Richard Hall, male   DOB: [birth year], 66 y.o.   MRN: [id number] Patient ID: Richard Hall, male   DOB: 16-Mar-1956, 66 y.o.   MRN: [id number] Patient ID: Richard Hall, male   DOB: 1955/06/13, 66 y.o.   MRN: [id number] Patient ID: Richard Hall, male   DOB: 06-Mar-1956, 66 y.o.   MRN: [id number]

## 2021-07-01 NOTE — Plan of Care (Signed)
°  Problem: Clinical Measurements: Goal: Ability to maintain clinical measurements within normal limits will improve Outcome: Progressing Goal: Will remain free from infection Outcome: Progressing   Problem: Nutrition: Goal: Adequate nutrition will be maintained Outcome: Progressing   Problem: Coping: Goal: Level of anxiety will decrease Outcome: Progressing   Problem: Elimination: Goal: Will not experience complications related to bowel motility Outcome: Progressing Goal: Will not experience complications related to urinary retention Outcome: Progressing   Problem: Pain Managment: Goal: General experience of comfort will improve Outcome: Progressing   Problem: Safety: Goal: Ability to remain free from injury will improve Outcome: Progressing   Problem: Skin Integrity: Goal: Risk for impaired skin integrity will decrease Outcome: Progressing   Problem: Education: Goal: Knowledge of General Education information will improve Description: Including pain rating scale, medication(s)/side effects and non-pharmacologic comfort measures Outcome: Not Progressing   Problem: Health Behavior/Discharge Planning: Goal: Ability to manage health-related needs will improve Outcome: Not Progressing   Problem: Clinical Measurements: Goal: Diagnostic test results will improve Outcome: Not Progressing Goal: Respiratory complications will improve Outcome: Not Progressing Goal: Cardiovascular complication will be avoided Outcome: Not Progressing   Problem: Activity: Goal: Risk for activity intolerance will decrease Outcome: Not Progressing

## 2021-07-01 NOTE — Progress Notes (Signed)
Patient ID: Richard Hall, male   DOB: [birth year], 66 y.o.   MRN: [id number]  [facility] Progress Note   Assessment/ Plan:   1.  Volume overload with acute exacerbation of congestive heart failure: Continue efforts at volume unloading with hemodialysis-he appears to have had progressive loss of his estimated dry weight with some challenges at ultrafiltration with hemodialysis due to his low blood pressure. 2. ESRD: Continue hemodialysis on a Monday/Wednesday/Friday schedule via left radiocephalic fistula. 3. Anemia: Earlier status post 2 units PRBC transfusion and continuing on intravenous iron/ESA. 4. CKD-MBD: Phosphorus level currently at goal with acceptable calcium, continue Auryxia for phosphorus binder. 5.  Right upper lobe cavitary lesion: Seen earlier by pulmonology and felt to be consistent with septic pulmonary embolus-to continue oral Augmentin at discharge. 6.  Atrial fibrillation with rapid ventricular response: Currently rate controlled on metoprolol and remains on anticoagulation with Eliquis. 7.  Disposition: With progressive failure to thrive in the recent past and has been seen by palliative care service with plans to transition to hospice care if readmitted to the hospital in a short duration.  Subjective:   Reports to be feeling fair and denies any focal complaints-reports breathing is better   Objective:   BP 110/64    Pulse 66    Temp 97.6 F (36.4 C)    Resp 17    Ht 6' (1.829 m)    Wt 118.3 kg    SpO2 100%    BMI 35.37 kg/m   Physical Exam: Gen: Appears comfortable resting in dialysis CVS: Pulse regular rhythm, normal rate, S1 and S2 normal Resp: Poor inspiratory effort with decreased breath sounds over bases, no rales Abd: Soft, obese, nontender, bowel sounds normal Ext: 2+ bilateral lower extremity edema with 2+ upper extremity edema.  Left radiocephalic fistula cannulated at dialysis  Labs: BMET Recent Labs  Lab 06/25/21 0159 06/26/21 0231  06/27/21 0113 06/28/21 0112 06/29/21 0051 06/30/21 0303 07/01/21 0040  NA 131* 125* 129* 127* 130* 128* 127*  K 4.1 4.6 4.4 4.5 3.8 3.9 4.1  CL 94* 89* 93* 90* 93* 91* 92*  CO2 29 25 28 27 29 26 25   GLUCOSE 137* 167* 154* 154* 156* 194* 197*  BUN 29* 40* 30* 46* 30* 43* 56*  CREATININE 4.18* 5.40* 4.17* 5.54* 3.90* 5.31* 6.33*  CALCIUM 8.1* 8.1* 7.9* 8.0* 7.9* 8.2* 8.0*  PHOS 4.0 5.6* 4.5 4.9* 3.8 4.7* 5.3*   CBC Recent Labs  Lab 06/28/21 0112 06/29/21 0423 06/30/21 0303 07/01/21 0040  WBC 6.6 6.5 8.7 7.3  HGB 7.3* 8.1* 7.8* 7.4*  HCT 23.8* 26.0* 26.0* 25.1*  MCV 96.0 94.5 96.7 96.9  PLT 141* 160 185 189      Medications:     sodium chloride   Intravenous Once   sodium chloride   Intravenous Once   amitriptyline  100 mg Oral QHS   apixaban  2.5 mg Oral BID   atorvastatin  80 mg Oral QPM   bisacodyl  10 mg Oral Once   chlorhexidine  15 mL Mouth Rinse BID   Chlorhexidine Gluconate Cloth  6 each Topical Q0600   darbepoetin (ARANESP) injection - DIALYSIS  200 mcg Intravenous Q Wed-HD   insulin aspart  0-6 Units Subcutaneous TID WC   insulin glargine-yfgn  10 Units Subcutaneous QHS   lactulose  30 g Oral TID   levothyroxine  100 mcg Oral QAC breakfast   lidocaine-prilocaine  1 application Topical Q M,W,F   mouth rinse  15 mL Mouth Rinse q12n4p   metoprolol tartrate  12.5 mg Oral BID   midodrine  10 mg Oral TID WC   multivitamin with minerals  1 tablet Oral Daily   pantoprazole (PROTONIX) IV  40 mg Intravenous Q12H   [START ON 07/02/2021] polyethylene glycol  17 g Oral BID   polyethylene glycol  34 g Oral TID   senna-docusate  2 tablet Oral BID   sertraline  100 mg Oral Daily   Elmarie Shiley, MD 07/01/2021, 9:58 AM

## 2021-07-01 NOTE — Progress Notes (Signed)
RT note. Patient BIPAP QHS, not needed at this time, RT will continue to monitor.

## 2021-07-01 NOTE — Progress Notes (Signed)
Placed patient on BIPAP (dreamstation) for HS use.

## 2021-07-02 LAB — RENAL FUNCTION PANEL
Albumin: 1.8 g/dL — ABNORMAL LOW (ref 3.5–5.0)
Anion gap: 9 (ref 5–15)
BUN: 38 mg/dL — ABNORMAL HIGH (ref 8–23)
CO2: 26 mmol/L (ref 22–32)
Calcium: 7.9 mg/dL — ABNORMAL LOW (ref 8.9–10.3)
Chloride: 89 mmol/L — ABNORMAL LOW (ref 98–111)
Creatinine, Ser: 5.02 mg/dL — ABNORMAL HIGH (ref 0.61–1.24)
GFR, Estimated: 12 mL/min — ABNORMAL LOW (ref >60)
Glucose, Bld: 95 mg/dL (ref 70–99)
Phosphorus: 3.8 mg/dL (ref 2.5–4.6)
Potassium: 4.2 mmol/L (ref 3.5–5.1)
Sodium: 124 mmol/L — ABNORMAL LOW (ref 135–145)

## 2021-07-02 LAB — BPAM RBC
Blood Product Expiration Date: 202302132359
Blood Product Expiration Date: 202302232359
ISSUE DATE / TIME: 202301271150
ISSUE DATE / TIME: 202301301144
Unit Type and Rh: 6200
Unit Type and Rh: 6200

## 2021-07-02 LAB — TYPE AND SCREEN
ABO/RH(D): A POS
Antibody Screen: NEGATIVE
Unit division: 0
Unit division: 0

## 2021-07-02 LAB — CBC
HCT: 23.8 % — ABNORMAL LOW (ref 39.0–52.0)
Hemoglobin: 7.3 g/dL — ABNORMAL LOW (ref 13.0–17.0)
MCH: 28.4 pg (ref 26.0–34.0)
MCHC: 30.7 g/dL (ref 30.0–36.0)
MCV: 92.6 fL (ref 80.0–100.0)
Platelets: 210 10*3/uL (ref 150–400)
RBC: 2.57 MIL/uL — ABNORMAL LOW (ref 4.22–5.81)
RDW: 22.4 % — ABNORMAL HIGH (ref 11.5–15.5)
WBC: 6.9 10*3/uL (ref 4.0–10.5)
nRBC: 0.9 % — ABNORMAL HIGH (ref 0.0–0.2)

## 2021-07-02 LAB — RESP PANEL BY RT-PCR (FLU A&B, COVID) ARPGX2
Influenza A by PCR: NEGATIVE
Influenza B by PCR: NEGATIVE
SARS Coronavirus 2 by RT PCR: NEGATIVE

## 2021-07-02 LAB — GLUCOSE, CAPILLARY
Glucose-Capillary: 97 mg/dL (ref 70–99)
Glucose-Capillary: 136 mg/dL — ABNORMAL HIGH (ref 70–99)

## 2021-07-02 MED ORDER — HYDROMORPHONE HCL 1 MG/ML IJ SOLN
0.5000 mg | INTRAMUSCULAR | Status: DC | PRN
  Administered 2021-07-02 – 2021-07-03 (×2): 0.5 mg via INTRAVENOUS
  Administered 2021-07-03: 1 mg via INTRAVENOUS
  Administered 2021-07-04: 0.5 mg via INTRAVENOUS
  Administered 2021-07-04 (×3): 1 mg via INTRAVENOUS
  Administered 2021-07-05: 0.5 mg via INTRAVENOUS
  Administered 2021-07-05: 1 mg via INTRAVENOUS
  Administered 2021-07-05: 0.5 mg via INTRAVENOUS
  Administered 2021-07-05: 1 mg via INTRAVENOUS
  Filled 2021-07-02 (×2): qty 1
  Filled 2021-07-02: qty 0.5
  Filled 2021-07-02: qty 1
  Filled 2021-07-02: qty 0.5
  Filled 2021-07-02: qty 1
  Filled 2021-07-02 (×2): qty 0.5
  Filled 2021-07-02: qty 1
  Filled 2021-07-02: qty 0.5
  Filled 2021-07-02: qty 1

## 2021-07-02 MED ORDER — HALOPERIDOL LACTATE 5 MG/ML IJ SOLN: 0.5000 mg | INTRAMUSCULAR | Status: DC | PRN

## 2021-07-02 MED ORDER — GLYCOPYRROLATE 1 MG PO TABS
1.0000 mg | ORAL_TABLET | ORAL | Status: DC | PRN
  Filled 2021-07-02: qty 1

## 2021-07-02 MED ORDER — CHLORHEXIDINE GLUCONATE CLOTH 2 % EX PADS: 6.0000 | MEDICATED_PAD | Freq: Every day | CUTANEOUS | Status: DC

## 2021-07-02 MED ORDER — POLYVINYL ALCOHOL 1.4 % OP SOLN
1.0000 [drp] | Freq: Four times a day (QID) | OPHTHALMIC | Status: DC | PRN
  Filled 2021-07-02: qty 15

## 2021-07-02 MED ORDER — GLYCOPYRROLATE 0.2 MG/ML IJ SOLN: 0.2000 mg | INTRAMUSCULAR | Status: DC | PRN

## 2021-07-02 MED ORDER — BIOTENE DRY MOUTH MT LIQD: 15.0000 mL | OROMUCOSAL | Status: DC | PRN

## 2021-07-02 MED ORDER — LORAZEPAM 2 MG/ML IJ SOLN: 0.5000 mg | INTRAMUSCULAR | Status: DC | PRN

## 2021-07-02 MED ORDER — HALOPERIDOL LACTATE 2 MG/ML PO CONC
0.5000 mg | ORAL | Status: DC | PRN
  Filled 2021-07-02: qty 0.3

## 2021-07-02 MED ORDER — HALOPERIDOL 0.5 MG PO TABS
0.5000 mg | ORAL_TABLET | ORAL | Status: DC | PRN
  Filled 2021-07-02: qty 1

## 2021-07-02 NOTE — Progress Notes (Signed)
Occupational Therapy Treatment Patient Details Name: Richard Hall MRN: [id number] DOB: [birth year] Today's Date: 07/02/2021   History of present illness 66 y.o. male with medical history significant of hypertension, atrial fibrillation, congestive heart failure, CVA, ESRD on HD, and prostate cancer. Recently d/c from hospital to SNF and readmitted with acute respiratory failure due to overload. On prior admission, found to have chronic non-healed Lt femur facture despite ORIF, will request NWB status LLE.   OT comments  Pt in be upon arrival, pt seems sad. Pt Continues to require extensive assist with LB selfcare and mobility  Pt sits EOB for grooming and UB ADL tasks, requires +2 assist for lateral scoot to chair, however has made slow progress. Pt stated, " I don't think I can ever go back home again". OT will continue to follow acutely to maximize level of function and safety   Recommendations for follow up therapy are one component of a multi-disciplinary discharge planning process, led by the attending physician.  Recommendations may be updated based on patient status, additional functional criteria and insurance authorization.    Follow Up Recommendations  Skilled nursing-short term rehab (<3 hours/day)    Assistance Recommended at Discharge Frequent or constant Supervision/Assistance  Patient can return home with the following  Two people to help with walking and/or transfers;A lot of help with bathing/dressing/bathroom   Equipment Recommendations       Recommendations for Other Services      Precautions / Restrictions Precautions Precautions: Fall Restrictions LLE Weight Bearing: Non weight bearing       Mobility Bed Mobility Overal bed mobility: Needs Assistance Bed Mobility: Supine to Sit Rolling: Min assist   Supine to sit: Max assist Sit to supine: Mod assist   General bed mobility comments: Assist with LEs to EOB and off EOB and ot scoot hips forward, increaed  time and effort, used pads underneath pt    Transfers                   General transfer comment: will need +2 for lateral scoot to drop arm chair     Balance Overall balance assessment: Needs assistance Sitting-balance support: No upper extremity supported, Feet supported Sitting balance-Leahy Scale: Poor Sitting balance - Comments: R lean, sat EOB ~ 6 minutes for grooming and UB dressing                                   ADL either performed or assessed with clinical judgement   ADL Overall ADL's : Needs assistance/impaired     Grooming: Wash/dry hands;Wash/dry face;Brushing hair;Sitting;Min guard           Upper Body Dressing : Minimal assistance;Sitting                     General ADL Comments: pt limited by weakness and poor activity tolerance    Extremity/Trunk Assessment Upper Extremity Assessment Upper Extremity Assessment: Generalized weakness   Lower Extremity Assessment Lower Extremity Assessment: Defer to PT evaluation        Vision Baseline Vision/History: 1 Wears glasses Ability to See in Adequate Light: 0 Adequate Patient Visual Report: No change from baseline     Perception     Praxis      Cognition Arousal/Alertness: Awake/alert Behavior During Therapy: Flat affect Overall Cognitive Status: No family/caregiver present to determine baseline cognitive functioning  Following Commands: Follows one step commands with increased time Safety/Judgement: Decreased awareness of deficits, Decreased awareness of safety              Exercises      Shoulder Instructions       General Comments      Pertinent Vitals/ Pain       Pain Assessment Pain Assessment: Faces Faces Pain Scale: Hurts little more Facial Expression: Grimacing Pain Location: L thigh Pain Descriptors / Indicators: Discomfort Pain Intervention(s): Limited activity within patient's tolerance, Monitored during  session, Repositioned  Home Living                                          Prior Functioning/Environment              Frequency  Min 2X/week        Progress Toward Goals  OT Goals(current goals can now be found in the care plan section)  Progress towards OT goals: OT to reassess next treatment     Plan Discharge plan remains appropriate;Frequency remains appropriate    Co-evaluation                 AM-PAC OT "6 Clicks" Daily Activity     Outcome Measure   Help from another person eating meals?: None Help from another person taking care of personal grooming?: A Little Help from another person toileting, which includes using toliet, bedpan, or urinal?: Total Help from another person bathing (including washing, rinsing, drying)?: A Lot Help from another person to put on and taking off regular upper body clothing?: A Little Help from another person to put on and taking off regular lower body clothing?: A Lot 6 Click Score: 15    End of Session    OT Visit Diagnosis: Muscle weakness (generalized) (M62.81);Other symptoms and signs involving cognitive function;Other abnormalities of gait and mobility (R26.89);Pain Pain - Right/Left: Left Pain - part of body: Leg (back)   Activity Tolerance Patient limited by fatigue   Patient Left in bed;with call bell/phone within reach;with bed alarm set   Nurse Communication          Time: 0518-3358 OT Time Calculation (min): 16 min  Charges: OT General Charges $OT Visit: 1 Visit OT Treatments $Therapeutic Activity: 8-22 mins    [name] 07/02/2021, 2:03 PM

## 2021-07-02 NOTE — Progress Notes (Addendum)
PROGRESS NOTE                                                                                                                                                                                                             Patient Demographics:    Richard Hall, is a 66 y.o. male, DOB - Nov 02, 1955, OJJ:[id number]  Outpatient Primary MD for the patient is [name], MD    LOS - 17  Admit date - 06/15/2021    Chief Complaint  Patient presents with   Shortness of Breath       Brief Narrative (HPI from H&P)      Richard Hall is a 66 y.o. male with medical history significant of hypertension, atrial fibrillation, congestive heart failure, CVA, ESRD on HD, and prostate cancer presents with complaints of shortness of breath.  Patient had just recently been hospitalized from 12/30-1/13 after initially presenting with septic shock related to MSSA bacteremia and COVID-19 viral pneumonia.  During his hospital stay patient required intubation.  ID had been consulted and patient was recommended to complete 6 weeks of cefazolin to be completed on 07/17/2021.  He went to the rehab facility but soon was sent back to the hospital for hypoxia, in the ER his work-up was suggestive of possible continued pneumonia, troponin, cardiology was consulted and he was admitted for further care. [facility] course was noted for cavitary pneumonia, likely due to aspiration, chronic GI blood loss anemia due to GI bleed requiring multiple transfusion, severe deconditioning, and frailty, palliative medicine consulted.   Subjective:   Patient remains with poor appetite, had black stools yesterday, report generalized weakness and fatigue  Assessment  & Plan :   Acute Hypoxic Resp. Failure  - due to Acute on chronic diastolic CHF in the presence of severe MR, EF 60% on recent echo with NSTEMI in a patient with ESRD and recent COVID-19 pneumonia with a new  cavitary lesion on CT scan ?  Chronic intermittent microaspiration   Acute on chronic diastolic CHF -Volume management with hemodialysis.  Difficult to manage in the setting of soft blood pressure  cavitary pneumonia - no sepsis this admission, however does have leukocytosis with a cavitary lesion on CT scan raising the suspicion for possible underlying aspiration, SLP following, also continue fluid removal through HD, renal and cardiology on board will  also request pulmonary to opine on his cavitary lung lesion. CRP and procalcitonin also point towards a bacterial infectious etiology as well.  He was briefly placed on Unasyn with excellent clinical improvement, cultures here this admission negative thus far.  Have been consulted ID discussed the plan with pulmonary on 06/20/2021 plan is to continue cefazolin till 07/17/2021 thereafter outpatient ID and pulmonary follow-up.   - Encouraged to sit up in chair in the daytime use I-S and flutter valve for pulmonary toiletry.  Will advance activity and titrate down oxygen as possible.   - He is on cefazolin for recent MSSA bacteremia with stop date of 07/17/2021 (pharm confirmed)   Prostate cancer with metastasis to the bone -He is being followed by Dr. Tresa Moore from urology, Dr. Alen Blew from oncology  ESRD.   - Renal on board for fluid removal through HD.  Recent MSSA bacteremia.   - Cefazolin with a stop date of 07/17/2021 seen by ID this admission.  Dysphagia.   - Speech therapy following currently on dysphagia 2 diet.  Kindly see #1 above.  AOCD.  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   -Concern for chronic blood loss anemia, GI consulted, patient is high risk for  procedures. -Resumed on Eliquis today at 2.5 mg p.o. twice daily, monitor CBC closely -On Aranesp and IV iron per renal -Patient remains with significant anemia due to chronic blood loss anemia despite receiving 5 units PRBC during hospital stay, as well despite being on Aranesp and IV iron, related to mild chronic blood loss anemia, but as well patient with prostate cancer and bone metastasis which is  contributing as well. -He is transfused 1 unit PRBC yesterday as well(5 this admission), despite that hemoglobin dropped to 7.3, Eliquis currently on hold. -Seen by GI.  Obesity with OSA. -  BMI 35 follow with PCP for weight loss nightly CPAP.  Counseled on compliance  Recent left ankle fracture.  -  Nonweightbearing left leg per last admission, outpatient orthopedics follow-up.  PT OT.  Hypothyroidism.   -On Synthroid   Chronic A. fib [place] vas 2 score of greater than 4.   - continue low-dose beta-blocker as tolerated by blood pressure.   -Was on hold  Hypotension.   -Blood pressure has stabilized and improving titrate off midodrine.  Dyslipidemia.   -On Statin  Hyponatremia - management with dialysis.  History of prostate cancer - Continue Xtandi.  Elevated troponin.   - Likely demand ischemia in the setting of ESRD, demand mismatch caused by hypoxia from #1 above, chest pain-free.  Cardiology on board, on Eliquis, statin, low-dose beta-blocker as tolerated by blood pressure and monitor  Severe MR.  -  Outpatient cardiology follow-up.  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  History of COVID-19 infection in the past, still COVID-19 positive.  Repeat test on 06/25/2021 is still positive but cycle threshold is 39  i.e. he is not contagious and this is not an active infection.    DM type II.  -  On Lantus and sliding scale.  Monitor and adjust  Lab Results  Component Value Date   HGBA1C 6.1 (H) 06/01/2021    CBG (last 3)  Recent Labs    07/01/21 2041 07/02/21 0802  07/02/21 1142  GLUCAP 193* 97 136*      Goals of care -Patient with very poor functional status, weak, deconditioned, HD dependent, difficult to dialyze due to low blood pressure, he remains on volume overload, multiple comorbidities, and with metastatic prostate cancer, blood loss anemia, requiring multiple transfusion, overall prognosis is very poor, as well life quality is poor as well , palliative medicine has been consulted to address goals of care.  Patient is DNR, continue with current management, with plan to DC to SNF, but if he continues to deteriorate or require frequent admissions then would consider hospice. -But patient continues to decline despite being hospitalized, with full medical management, I have discussed with the son and the patient again today, he is having multiple uncurable conditions, and palliative medicine has been consulted again to discuss with the patient initiation of comfort/Hospice measures during this hospital stay.      Condition - Extremely Guarded  Family Communication  : None at bedside, discussed with son by phone today.  Code Status : DNR  Consults  : Cardiology, Pulmonary, renal, Palliative ,GI  PUD Prophylaxis :    Procedures  :     CT Scan abdomen pelvis repeat on 06/25/2021 without contrast.  No evidence of retroperitoneal bleed.  CT scan - 1. The appearance of the chest suggest congestive heart failure, as above. 2. In addition, there is a new thick-walled cavitary nodule measuring 1.4 x 1.2 cm in the right upper lobe. This is potentially infectious or inflammatory in etiology, however, underlying neoplasm is not excluded. Short-term follow-up repeat noncontrast chest CT is recommended in 2-3 months to ensure the stability or regression of this lesion. 3. Mildly enlarged right paratracheal lymph node, nonspecific in the setting of presumed congestive heart failure, but concerning in light of the right upper lobe pulmonary nodule. Attention at  time of repeat CT examination is recommended to ensure stability/regression. 4. Diffuse sclerotic lesions noted throughout the visualized axial and appendicular skeleton, presumably reflective of widespread osseous metastasis in this patient with history of prostate cancer. 5. Aortic atherosclerosis, in addition to left main and three-vessel coronary artery disease. Please note that although the presence of coronary artery calcium documents the presence of coronary artery disease, the severity of this disease and any potential stenosis cannot be assessed on this non-gated CT examination. Assessment for potential risk factor modification, dietary therapy or pharmacologic therapy may be warranted, if clinically indicated. 6. There are calcifications of the aortic valve. Echocardiographic correlation for evaluation of potential valvular dysfunction may be warranted if clinically indicated. Aortic Atherosclerosis (ICD10-I70.0).      Disposition Plan  :    Status is: Inpatient  Remains inpatient appropriate because: Acute hypoxic respiratory failure  DVT Prophylaxis  :         Lab Results  Component Value Date   PLT 210 07/02/2021    Diet :  Diet Order             Diet renal with fluid restriction Fluid restriction: 1200 mL Fluid; Room service appropriate? Yes; Fluid consistency: Thin  Diet effective now                    Inpatient Medications  Scheduled Meds:  amitriptyline  100 mg Oral QHS   atorvastatin  80 mg Oral QPM   chlorhexidine  15 mL Mouth Rinse BID   Chlorhexidine Gluconate Cloth  6 each Topical Q0600   darbepoetin (ARANESP) injection - DIALYSIS  200 mcg  Intravenous Q Wed-HD   insulin aspart  0-6 Units Subcutaneous TID WC   insulin glargine-yfgn  10 Units Subcutaneous QHS   levothyroxine  100 mcg Oral QAC breakfast   lidocaine-prilocaine  1 application Topical Q M,W,F   mouth rinse  15 mL Mouth Rinse q12n4p   metoprolol tartrate  12.5 mg Oral BID   midodrine   10 mg Oral TID WC   multivitamin with minerals  1 tablet Oral Daily   pantoprazole (PROTONIX) IV  40 mg Intravenous Q12H   polyethylene glycol  17 g Oral BID   senna-docusate  2 tablet Oral BID   sertraline  100 mg Oral Daily   Continuous Infusions:   ceFAZolin (ANCEF) IV 2 g (07/01/21 1419)   PRN Meds:.acetaminophen **OR** acetaminophen, albuterol, calcium carbonate, diphenhydrAMINE, diphenoxylate-atropine, guaiFENesin, ondansetron (ZOFRAN) IV      Phillips Climes M.D on 07/02/2021 at 11:51 AM  To page go to [email or web address]   [facility] -  Office  [phone]  See all Orders from today for further details    Objective:   Vitals:   07/02/21 0400 07/02/21 0545 07/02/21 0803 07/02/21 1145  BP: 120/67  (!) 109/57 (!) 104/55  Pulse: 66 63 65 62  Resp: 18 20 19 20   Temp: (!) 97.3 F (36.3 C)  97.6 F (36.4 C) 97.6 F (36.4 C)  TempSrc: Oral  Axillary Axillary  SpO2: 100% 98% 100% 98%  Weight:  122.7 kg    Height:        Wt Readings from Last 3 Encounters:  07/02/21 122.7 kg  06/14/21 117.6 kg  07/26/20 125.3 kg     Intake/Output Summary (Last 24 hours) at 07/02/2021 1151 Last data filed at 07/01/2021 1310 Gross per 24 hour  Intake 715 ml  Output 3200 ml  Net -2485 ml       Physical Exam  Awake Alert, Oriented X 3, patient extremely frail, deconditioned and chronically ill-appearing, appears older than stated age Symmetrical Chest wall movement, Minister entry bilaterally RRR,No Gallops,Rubs or new Murmurs, No Parasternal Heave +ve B.Sounds, Abd Soft, No tenderness, No rebound - guarding or rigidity. No Cyanosis, Clubbing ,+2  edema, No new Rash or bruise     RN pressure injury documentation: Pressure Injury 06/11/21 Coccyx Medial Stage 2 -  Partial thickness loss of dermis presenting as a shallow open injury with a red, pink wound bed without slough. (Active)  06/11/21 2159  Location: Coccyx  Location Orientation: Medial  Staging: Stage 2 -   Partial thickness loss of dermis presenting as a shallow open injury with a red, pink wound bed without slough.  Wound Description (Comments):   Present on Admission:      Data Review:   Recent Labs  Lab 06/28/21 0112 06/29/21 0423 06/30/21 0303 07/01/21 0040 07/02/21 0313  WBC 6.6 6.5 8.7 7.3 6.9  HGB 7.3* 8.1* 7.8* 7.4* 7.3*  HCT 23.8* 26.0* 26.0* 25.1* 23.8*  PLT 141* 160 185 189 210  MCV 96.0 94.5 96.7 96.9 92.6  MCH 29.4 29.5 29.0 28.6 28.4  MCHC 30.7 31.2 30.0 29.5* 30.7  RDW 20.3* 21.2* 20.9* 20.8* 22.4*    Recent Labs  Lab 06/25/21 1208 06/26/21 0231 06/28/21 0112 06/29/21 0051 06/30/21 0303 07/01/21 0040 07/02/21 0313  NA  --    < > 127* 130* 128* 127* 124*  K  --    < > 4.5 3.8 3.9 4.1 4.2  CL  --    < > 90* 93* 91* 92* 89*  CO2  --    < > 27 29 26 25 26   GLUCOSE  --    < > 154* 156* 194* 197* 95  BUN  --    < > 46* 30* 43* 56* 38*  CREATININE  --    < > 5.54* 3.90* 5.31* 6.33* 5.02*  CALCIUM  --    < > 8.0* 7.9* 8.2* 8.0* 7.9*  ALBUMIN  --    < > 1.8* 1.8* 1.9* 1.9* 1.8*  DDIMER 1.29*  --   --   --   --   --   --   INR 1.7*  --   --   --   --   --   --    < > = values in this interval not displayed.     Radiology Reports No results found.   Patient ID: [name], male   DOB: 06/30/1955, 66 y.o.   MRN: [id number] Patient ID: Baptiste Littler, male   DOB: 07-15-1955, 66 y.o.   MRN: [id number] Patient ID: Ashok Sawaya, male   DOB: 03-10-1956, 66 y.o.   MRN: [id number] Patient ID: Amirr Achord, male   DOB: November 19, 1955, 66 y.o.   MRN: [id number] Patient ID: Epic Tribbett, male   DOB: [birth year], 66 y.o.   MRN: [id number] Patient ID: Mohab Ashby, male   DOB: 1956/02/11, 66 y.o.   MRN: [id number] Patient ID: Franke Menter, male   DOB: January 31, 1956, 66 y.o.   MRN: [id number]

## 2021-07-02 NOTE — NC FL2 (Signed)
[place] LEVEL OF CARE SCREENING TOOL     IDENTIFICATION  Patient Name: Richard Hall Birthdate: [birth year] Sex: male Admission Date (Current Location): 06/15/2021  [facility] and [place] Number:  Herbalist and Address:  [facility], [place] [address], [place], [place] [address]      Provider Number: [id number]  Attending Physician Name and Address:  Albertine Patricia, MD  Relative Name and Phone Number:  Nolet,rodney [phone])   [phone] [facility] Phone)    Current Level of Care: Hospital Recommended Level of Care: [place] Prior Approval Number:    Date Approved/Denied:   PASRR Number: [id number]  Discharge Plan: SNF    Current Diagnoses: Patient Active Problem List   Diagnosis Date Noted   Hyperkalemia 06/15/2021   SIRS (systemic inflammatory response syndrome) ([place]) 06/15/2021   Fluid overload 06/15/2021   Pressure injury of skin 06/12/2021   Respiratory failure ([place])    Malnutrition of moderate degree 06/06/2021   MSSA bacteremia    Encephalopathy acute    Chronic obstructive pulmonary disease with acute lower respiratory infection ([place])    Elevated troponin    Atrial fibrillation with RVR ([place]) 05/31/2021   Acute on chronic respiratory failure with hypoxia ([place]) 05/31/2021   CHF exacerbation ([place]) 05/31/2021   Pneumonia due to COVID-19 virus 05/31/2021   Urinary retention 07/26/2020   Prostate cancer metastatic to bone ([place]) 05/18/2019   Fracture 05/17/2019   Hypocalcemia 05/14/2019   Bilateral leg edema 01/28/2019   Symptomatic anemia 01/05/2019   Gross hematuria 01/05/2019   Gastrointestinal hemorrhage with melena 01/05/2019   OSA on CPAP    Hypothyroidism (acquired)    Essential hypertension    Dyslipidemia    Diabetes mellitus with end-stage renal disease ([place])    Acute blood loss anemia    Chronic diastolic congestive heart failure ([place]) 02/09/2018   Anemia, chronic renal failure,  stage 4 (severe) ([place]) 11/09/2017   Chronic respiratory failure with hypoxia and hypercapnia ([place]) 11/09/2017   Iron deficiency anemia 10/28/2017   Atrial fibrillation ([place]) 10/07/2017   Morbid obesity with BMI of 40.0-44.9, adult ([place]) 07/14/2016   Diabetic peripheral neuropathy associated with type 2 diabetes mellitus ([place]) 10/10/2015   Allergy 04/30/2015   Bronchitis 04/30/2015   ED (erectile dysfunction) of organic origin 04/30/2015   Lipoma of back 04/30/2015   Microalbuminuria 04/30/2015   Osteoarthritis 04/30/2015   Microalbuminuria due to type 2 diabetes mellitus ([place]) 01/09/2014   ESRD (end stage renal disease) on dialysis ([place]) 07/22/2013   Acute ill-defined cerebrovascular disease 04/04/2013   Dizziness 04/04/2013   Hypercholesterolemia 04/04/2013   Hypoxemia 04/04/2013   Insomnia 04/04/2013   Periodic limb movement disorder 04/04/2013   Diplopia 06/07/2012   Personal history of transient ischemic attack (TIA), and cerebral infarction without residual deficits 05/19/2011    Orientation RESPIRATION BLADDER Height & Weight     Self, Time, Situation, Place  O2 (2L nasal cannula; cpap at night) Continent, External catheter Weight: 122.7 kg Height:  6' (182.9 cm)  BEHAVIORAL SYMPTOMS/MOOD NEUROLOGICAL BOWEL NUTRITION STATUS      Continent Diet (see dc summary)  AMBULATORY STATUS COMMUNICATION OF NEEDS Skin   Extensive Assist Verbally PU Stage and Appropriate Care (Pressure injury Coccyx medial stage 2)                       Personal Care Assistance Level of Assistance  Bathing, Feeding, Dressing Bathing Assistance: Maximum assistance Feeding assistance:  Limited assistance Dressing Assistance: Maximum assistance     Functional Limitations Info  Sight, Hearing, Speech Sight Info: Adequate Hearing Info: Adequate Speech Info: Adequate    SPECIAL CARE FACTORS FREQUENCY  PT (By licensed PT), OT (By licensed OT)     PT Frequency: 5x/week OT Frequency: 5x/week             Contractures Contractures Info: Not present    Additional Factors Info  Code Status, Allergies, Psychotropic, Insulin Sliding Scale Code Status Info: Full Allergies Info: Gabapentin Psychotropic Info: Zoloft Insulin Sliding Scale Info: See dc summary       Current Medications (07/02/2021):  This is the current hospital active medication list Current Facility-Administered Medications  Medication Dose Route Frequency Provider Last Rate Last Admin   acetaminophen (TYLENOL) tablet 650 mg  650 mg Oral Q6H PRN Fuller Plan A, MD   650 mg at 06/20/21 0102   Or   acetaminophen (TYLENOL) suppository 650 mg  650 mg Rectal Q6H PRN Fuller Plan A, MD       albuterol (PROVENTIL) (2.5 MG/3ML) 0.083% nebulizer solution 2.5 mg  2.5 mg Nebulization Q4H PRN Fuller Plan A, MD   2.5 mg at 06/17/21 0349   amitriptyline (ELAVIL) tablet 100 mg  100 mg Oral QHS Smith, Rondell A, MD   100 mg at 07/01/21 2134   atorvastatin (LIPITOR) tablet 80 mg  80 mg Oral QPM Smith, Rondell A, MD   80 mg at 07/01/21 1713   calcium carbonate (TUMS - dosed in mg elemental calcium) chewable tablet 200 mg of elemental calcium  1 tablet Oral BID PRN Elgergawy, Silver Huguenin, MD   200 mg of elemental calcium at 06/26/21 1852   ceFAZolin (ANCEF) IVPB 2g/100 mL premix  2 g Intravenous Q M,W,F-HD Laurice Record, MD 200 mL/hr at 07/01/21 1419 2 g at 07/01/21 1419   chlorhexidine (PERIDEX) 0.12 % solution 15 mL  15 mL Mouth Rinse BID Fuller Plan A, MD   15 mL at 07/02/21 0834   Chlorhexidine Gluconate Cloth 2 % PADS 6 each  6 each Topical Q0600 Roney Jaffe, MD   6 each at 07/02/21 0545   Darbepoetin Alfa (ARANESP) injection 200 mcg  200 mcg Intravenous Q Wed-HD Lynnda Child, PA-C   200 mcg at 06/26/21 0755   diphenhydrAMINE (BENADRYL) injection 25 mg  25 mg Intravenous Q6H PRN Thurnell Lose, MD       diphenoxylate-atropine (LOMOTIL) 2.5-0.025 MG per tablet 1 tablet  1 tablet Oral Q6H PRN Fuller Plan  A, MD       guaiFENesin (MUCINEX) 12 hr tablet 600 mg  600 mg Oral Q12H PRN Smith, Rondell A, MD       insulin aspart (novoLOG) injection 0-6 Units  0-6 Units Subcutaneous TID WC Tamala Julian, Rondell A, MD   1 Units at 07/01/21 1713   insulin glargine-yfgn (SEMGLEE) injection 10 Units  10 Units Subcutaneous QHS Fuller Plan A, MD   10 Units at 07/01/21 2134   levothyroxine (SYNTHROID) tablet 100 mcg  100 mcg Oral QAC breakfast Fuller Plan A, MD   100 mcg at 07/02/21 0430   lidocaine-prilocaine (EMLA) cream 1 application  1 application Topical Q M,W,F Smith, Rondell A, MD       MEDLINE mouth rinse  15 mL Mouth Rinse q12n4p Smith, Rondell A, MD   15 mL at 07/02/21 1228   metoprolol tartrate (LOPRESSOR) tablet 12.5 mg  12.5 mg Oral BID Thurnell Lose, MD   12.5 mg  at 07/02/21 0834   midodrine (PROAMATINE) tablet 10 mg  10 mg Oral TID WC Roney Jaffe, MD   10 mg at 07/02/21 1227   multivitamin with minerals tablet 1 tablet  1 tablet Oral Daily Fuller Plan A, MD   1 tablet at 07/02/21 0834   ondansetron (ZOFRAN) injection 4 mg  4 mg Intravenous Q6H PRN Thurnell Lose, MD       pantoprazole (PROTONIX) injection 40 mg  40 mg Intravenous Q12H Thurnell Lose, MD   40 mg at 07/02/21 0835   polyethylene glycol (MIRALAX / GLYCOLAX) packet 17 g  17 g Oral BID Elgergawy, Silver Huguenin, MD   17 g at 07/02/21 0834   senna-docusate (Senokot-S) tablet 2 tablet  2 tablet Oral BID Elgergawy, Silver Huguenin, MD   2 tablet at 07/02/21 0834   sertraline (ZOLOFT) tablet 100 mg  100 mg Oral Daily Fuller Plan A, MD   100 mg at 07/02/21 6770     Discharge Medications: Please see discharge summary for a list of discharge medications.  Relevant Imaging Results:  Relevant Lab Results:   Additional Information SSN: [id number]; Recent covid. Referral placed to [facility] on 07/02/21 to follow at SNF and once home.  Carles Collet, RN

## 2021-07-02 NOTE — Progress Notes (Signed)
Physical Therapy Treatment Patient Details Name: Richard Hall MRN: [id number] DOB: Jun 05, 1955 Today's Date: 07/02/2021   History of Present Illness 66 y.o. male with medical history significant of hypertension, atrial fibrillation, congestive heart failure, CVA, ESRD on HD, and prostate cancer. Recently d/c from hospital to SNF and readmitted with acute respiratory failure due to overload. On prior admission, found to have chronic non-healed Lt femur facture despite ORIF, will request NWB status LLE.    PT Comments    Patient agreeable to therapies. Continues to require +2 for all mobility except rolling with rails. Has made limited, slow progress over past two weeks with frequency 2x/week. Patient continues to require a maximove lift for back to bed via nursing. Goals updated.     Recommendations for follow up therapy are one component of a multi-disciplinary discharge planning process, led by the attending physician.  Recommendations may be updated based on patient status, additional functional criteria and insurance authorization.  Follow Up Recommendations  Skilled nursing-short term rehab (<3 hours/day)     Assistance Recommended at Discharge Frequent or constant Supervision/Assistance  Patient can return home with the following Two people to help with walking and/or transfers;Two people to help with bathing/dressing/bathroom;Assistance with cooking/housework;Assist for transportation;Help with stairs or ramp for entrance   Equipment Recommendations  None recommended by PT (TBD at SNF)    Recommendations for Other Services       Precautions / Restrictions Precautions Precautions: Fall Restrictions LLE Weight Bearing: Non weight bearing     Mobility  Bed Mobility Overal bed mobility: Needs Assistance Bed Mobility: Supine to Sit     Supine to sit: Mod assist, +2 for physical assistance, HOB elevated     General bed mobility comments: Assist to bring legs off of bed,  pivot hips using bed pad, elevate trunk into sitting, and bring hips around into sitting.    Transfers Overall transfer level: Needs assistance Equipment used: None Transfers: Bed to chair/wheelchair/BSC            Lateral/Scoot Transfers: Mod assist, +2 physical assistance, From elevated surface General transfer comment: Assist using bed pad under hips to assist scooting hips from bed to drop arm recliner. Verbal cues for technique/hand and foot placement    Ambulation/Gait                   Stairs             Wheelchair Mobility    Modified Rankin (Stroke Patients Only)       Balance Overall balance assessment: Needs assistance Sitting-balance support: No upper extremity supported, Feet supported Sitting balance-Leahy Scale: Poor Sitting balance - Comments: right lateral lean while EOB x ~5 minutes (taking meds and prep for transfer) Postural control: Right lateral lean                                  Cognition Arousal/Alertness: Awake/alert Behavior During Therapy: Flat affect Overall Cognitive Status: No family/caregiver present to determine baseline cognitive functioning                         Following Commands: Follows one step commands with increased time Safety/Judgement: Decreased awareness of deficits, Decreased awareness of safety     General Comments: slow to respond; requires cues for best hand placement for lateral scooting; leans very far forward for scooting, putting weight on LLE  Exercises      General Comments        Pertinent Vitals/Pain Pain Assessment Pain Assessment: Faces Faces Pain Scale: Hurts little more Pain Location: lateral left thigh Pain Descriptors / Indicators: Discomfort Pain Intervention(s): Limited activity within patient's tolerance, Monitored during session, Repositioned    Home Living                          Prior Function            PT Goals  (current goals can now be found in the care plan section) Acute Rehab PT Goals Patient Stated Goal: Get well PT Goal Formulation: With patient Time For Goal Achievement: 07/16/21 Potential to Achieve Goals: Fair Progress towards PT goals: Not progressing toward goals - comment (pt only being seen 2x/wk for 2 week goals (goals were lofty))    Frequency    Min 2X/week      PT Plan Current plan remains appropriate    Co-evaluation              AM-PAC PT "6 Clicks" Mobility   Outcome Measure  Help needed turning from your back to your side while in a flat bed without using bedrails?: A Lot Help needed moving from lying on your back to sitting on the side of a flat bed without using bedrails?: Total Help needed moving to and from a bed to a chair (including a wheelchair)?: Total Help needed standing up from a chair using your arms (e.g., wheelchair or bedside chair)?: Total Help needed to walk in hospital room?: Total Help needed climbing 3-5 steps with a railing? : Total 6 Click Score: 7    End of Session Equipment Utilized During Treatment: Oxygen Activity Tolerance: Patient tolerated treatment well Patient left: in chair;with call bell/phone within reach;with chair alarm set Nurse Communication: Mobility status;Need for lift equipment (maximove back to bed) PT Visit Diagnosis: Other abnormalities of gait and mobility (R26.89);Muscle weakness (generalized) (M62.81);Other (comment) Pain - Right/Left: Left Pain - part of body: Leg     Time: 3810-1751 PT Time Calculation (min) (ACUTE ONLY): 19 min  Charges:  $Therapeutic Activity: 8-22 mins                      [name], PT Acute Rehabilitation Services  Pager [phone] Office [phone]    [name] 07/02/2021, 9:03 AM

## 2021-07-02 NOTE — Progress Notes (Signed)
[facility] Progress Note   Subjective:   Eating breakfast. Reports he had some trouble breathing last night while laying flat but none at present. Denies CP, palpitations, dizziness and nausea. HD yesterday with net UF 3.2L.   Objective Vitals:   07/01/21 2349 07/02/21 0400 07/02/21 0545 07/02/21 0803  BP: 109/64 120/67  (!) 109/57  Pulse: 65 66 63 65  Resp: 19 18 20 19   Temp: 98 F (36.7 C) (!) 97.3 F (36.3 C)  97.6 F (36.4 C)  TempSrc: Oral Oral  Axillary  SpO2: 94% 100% 98% 100%  Weight:   122.7 kg   Height:       Physical Exam General: Alert male in NAD Heart: RRR, no murmurs, rubs or gallops Lungs: Poor effort, diminished breath sounds in bases. No rales/wheezing auscultated Abdomen: Soft, non-tender, +BS Extremities: 2+ pitting edema bilateral lower extremities Dialysis Access:  LUE AVF + t/b  Additional Objective Labs: Basic Metabolic Panel: Recent Labs  Lab 06/30/21 0303 07/01/21 0040 07/02/21 0313  NA 128* 127* 124*  K 3.9 4.1 4.2  CL 91* 92* 89*  CO2 26 25 26   GLUCOSE 194* 197* 95  BUN 43* 56* 38*  CREATININE 5.31* 6.33* 5.02*  CALCIUM 8.2* 8.0* 7.9*  PHOS 4.7* 5.3* 3.8   Liver Function Tests: Recent Labs  Lab 06/30/21 0303 07/01/21 0040 07/02/21 0313  ALBUMIN 1.9* 1.9* 1.8*   No results for input(s): LIPASE, AMYLASE in the last 168 hours. CBC: Recent Labs  Lab 06/28/21 0112 06/29/21 0423 06/30/21 0303 07/01/21 0040 07/02/21 0313  WBC 6.6 6.5 8.7 7.3 6.9  HGB 7.3* 8.1* 7.8* 7.4* 7.3*  HCT 23.8* 26.0* 26.0* 25.1* 23.8*  MCV 96.0 94.5 96.7 96.9 92.6  PLT 141* 160 185 189 210   Blood Culture    Component Value Date/Time   SDES BLOOD RIGHT ARM 06/15/2021 0919   SPECREQUEST  06/15/2021 0919    BOTTLES DRAWN AEROBIC AND ANAEROBIC Blood Culture adequate volume   CULT  06/15/2021 0919    NO GROWTH 5 DAYS Performed at [facility], [place] [address], [place], [place] [address]    REPTSTATUS 06/20/2021 FINAL  06/15/2021 0919    Cardiac Enzymes: No results for input(s): CKTOTAL, CKMB, CKMBINDEX, TROPONINI in the last 168 hours. CBG: Recent Labs  Lab 07/01/21 0816 07/01/21 1351 07/01/21 1600 07/01/21 2041 07/02/21 0802  GLUCAP 155* 114* 163* 193* 97   Iron Studies: No results for input(s): IRON, TIBC, TRANSFERRIN, FERRITIN in the last 72 hours. @lablastinr3 @ Studies/Results: No results found. Medications:   ceFAZolin (ANCEF) IV 2 g (07/01/21 1419)    sodium chloride   Intravenous Once   sodium chloride   Intravenous Once   amitriptyline  100 mg Oral QHS   atorvastatin  80 mg Oral QPM   bisacodyl  10 mg Oral Once   chlorhexidine  15 mL Mouth Rinse BID   Chlorhexidine Gluconate Cloth  6 each Topical Q0600   darbepoetin (ARANESP) injection - DIALYSIS  200 mcg Intravenous Q Wed-HD   insulin aspart  0-6 Units Subcutaneous TID WC   insulin glargine-yfgn  10 Units Subcutaneous QHS   levothyroxine  100 mcg Oral QAC breakfast   lidocaine-prilocaine  1 application Topical Q M,W,F   mouth rinse  15 mL Mouth Rinse q12n4p   metoprolol tartrate  12.5 mg Oral BID   midodrine  10 mg Oral TID WC   multivitamin with minerals  1 tablet Oral Daily   pantoprazole (PROTONIX) IV  40 mg Intravenous Q12H   polyethylene glycol  17 g Oral BID   senna-docusate  2 tablet Oral BID   sertraline  100 mg Oral Daily    Dialysis Orders:  Assessment/Plan: 1.  Volume overload with acute exacerbation of congestive heart failure: Continue efforts at volume unloading with hemodialysis-he appears to have had progressive loss of his estimated dry weight with some challenges at ultrafiltration with hemodialysis due to his low blood pressure. Na is low suggesting ongoing volume overload.  2. ESRD: Continue hemodialysis on a Monday/Wednesday/Friday schedule via left radiocephalic fistula. 3. Anemia: Earlier status post 2 units PRBC transfusion and continuing on intravenous iron/ESA. Will check CBC with next HD.  4.  CKD-MBD: Phosphorus level currently at goal with acceptable calcium, continue Auryxia for phosphorus binder. 5.  Right upper lobe cavitary lesion: Seen earlier by pulmonology and felt to be consistent with septic pulmonary embolus-to continue oral Augmentin at discharge. 6.  Atrial fibrillation with rapid ventricular response: Currently rate controlled on metoprolol and remains on anticoagulation with Eliquis. 7.  Disposition: With progressive failure to thrive in the recent past and has been seen by palliative care service with plans to transition to hospice care if readmitted to the hospital in a short duration.  Anice Paganini, PA-C 07/02/2021, 9:06 AM  [facility] Pager: [phone]

## 2021-07-02 NOTE — Progress Notes (Addendum)
Daily Rounding Note  07/02/2021, 12:29 PM  LOS: 17 days   SUBJECTIVE:   Chief complaint:    Ongoing anemia. Pt had GI consult on 06/25/2021 for anemia.  See that note for details.    Patient with bowel movement in the room while in the ER, moderate amount of black stool. Per nurse patient had 2 bowel movements last night, loose, dark. Patient complaining of lower abdominal discomfort, states it is from the laxatives that he is on. Patient on 1 L nasal cannula which is improved Denies nausea or vomiting.  OBJECTIVE:         Vital signs in last 24 hours:    Temp:  [97.3 F (36.3 C)-98.1 F (36.7 C)] 97.6 F (36.4 C) (01/31 1145) Pulse Rate:  [62-73] 62 (01/31 1145) Resp:  [13-21] 20 (01/31 1145) BP: (85-124)/(55-72) 104/55 (01/31 1145) SpO2:  [94 %-100 %] 98 % (01/31 1145) Weight:  [115.1 kg-122.7 kg] 122.7 kg (01/31 0545) Last BM Date: 07/01/21 Filed Weights   07/01/21 0912 07/01/21 1310 07/02/21 0545  Weight: 118.3 kg 115.1 kg 122.7 kg   General: Ill looking, comfortable, soft voice   Heart: Irregular Chest: No labored breathing or cough, on [place].  Lungs clear on anterior auscultation. Abdomen: Obese, soft without tenderness.  Active bowel sounds. Extremities: Lower extremity edema. Neuro/Psych: Oriented x3.  Intake/Output from previous day: 01/30 0701 - 01/31 0700 In: 715 [I.V.:400; Blood:315] Out: 3200   Intake/Output this shift: No intake/output data recorded.  Lab Results: Recent Labs    06/30/21 0303 07/01/21 0040 07/02/21 0313  WBC 8.7 7.3 6.9  HGB 7.8* 7.4* 7.3*  HCT 26.0* 25.1* 23.8*  PLT 185 189 210   BMET Recent Labs    06/30/21 0303 07/01/21 0040 07/02/21 0313  NA 128* 127* 124*  K 3.9 4.1 4.2  CL 91* 92* 89*  CO2 26 25 26   GLUCOSE 194* 197* 95  BUN 43* 56* 38*  CREATININE 5.31* 6.33* 5.02*  CALCIUM 8.2* 8.0* 7.9*   LFT Recent Labs    06/30/21 0303 07/01/21 0040  07/02/21 0313  ALBUMIN 1.9* 1.9* 1.8*   PT/INR No results for input(s): LABPROT, INR in the last 72 hours. Hepatitis Panel No results for input(s): HEPBSAG, HCVAB, HEPAIGM, HEPBIGM in the last 72 hours.  Studies/Results: No results found.  Scheduled Meds:  amitriptyline  100 mg Oral QHS   atorvastatin  80 mg Oral QPM   chlorhexidine  15 mL Mouth Rinse BID   Chlorhexidine Gluconate Cloth  6 each Topical Q0600   darbepoetin (ARANESP) injection - DIALYSIS  200 mcg Intravenous Q Wed-HD   insulin aspart  0-6 Units Subcutaneous TID WC   insulin glargine-yfgn  10 Units Subcutaneous QHS   levothyroxine  100 mcg Oral QAC breakfast   lidocaine-prilocaine  1 application Topical Q M,W,F   mouth rinse  15 mL Mouth Rinse q12n4p   metoprolol tartrate  12.5 mg Oral BID   midodrine  10 mg Oral TID WC   multivitamin with minerals  1 tablet Oral Daily   pantoprazole (PROTONIX) IV  40 mg Intravenous Q12H   polyethylene glycol  17 g Oral BID   senna-docusate  2 tablet Oral BID   sertraline  100 mg Oral Daily   Continuous Infusions:   ceFAZolin (ANCEF) IV 2 g (07/01/21 1419)   PRN Meds:.acetaminophen **OR** acetaminophen, albuterol, calcium carbonate, diphenhydrAMINE, diphenoxylate-atropine, guaiFENesin, ondansetron (ZOFRAN) IV   ASSESMENT:   Anemia.  Acute on chronic.  PRBCs x 5 . Melenic looking stool is FOBT +  [place] with colonoscopy, EGD, VCE 019 and 2020.   Notes mention there was a single AVM but not described as bleeding. HGB 7.8 to 7.4 and now 7.3 Likely multifactorial with ESRD, chronic disease Question continuing need for Eliquis, currently on hold since still bleeding on 2.5mg  BID dosing On Aranesp and IV iron per renal Currently slow decreasing HGB Patient continues to need transfusions, 5 total this visit, continues to have black stools, Very high risk for invasive procedures as noted by cardiology, SBE or EGD per Dr. Lorenso Courier   ESRD  Prostate cancer.    Cavitary  pneumonia, bacterial.   On Ancef.  Recent treatment admission for COVID-19, MSSA sepsis. .  A fib.   Chads vasc 5.  Abixiban stopped last night.     Richard Hall  07/02/2021, 12:29 PM Phone [phone]

## 2021-07-02 NOTE — Progress Notes (Signed)
° °  Referral received from [facility] for [facility] facility. I have received referral and plan to meet with the pt at bedside to further discuss hospice services and transition to full comfort care tomorrow. Please call with questions or concerns. Webb Silversmith RN [phone]

## 2021-07-02 NOTE — Progress Notes (Signed)
Palliative Medicine Inpatient Follow Up Note  HPI:  Per intake H&P --> 67 y.o. male with medical history significant of hypertension, atrial fibrillation, congestive heart failure, CVA, ESRD on HD, and prostate cancer. Recently d/c from hospital to SNF and readmitted with acute respiratory failure due to overload. On prior admission, found to have chronic non-healed Lt femur facture despite ORIF.   Palliative care has been asked to get involved to help address goals of care in the setting of chronic illness.  Today's Discussion (07/02/2021):  *Please note that this is a verbal dictation therefore any spelling or grammatical errors are due to the "[place]" system interpretation.  Chart reviewed inclusive of vital signs, progress notes, laboratory results, and diagnostic images.   I met with Richard Hall this morning, he was sitting up in the chair. He shares that he has been there for the last hour and a half. He denies pain or difficulty breathing this morning.  We reviewed the GI notes from yesterday. He doesn't seem sure if he would want any additional interventions.  _________________________________________________________________________ Addendum:  I met with Richard Hall later this afternoon. I shared that there are concerns in the setting of his ESRD, diastolic heart failure, cavitary PNA, and Prostate CA to the bone, and anemia. We reviewed that this is an awful lot to overcome and I'm quite concerned that Richard Hall may be at a point where re-evaluating goals are in his best interest.  Discussed again the two paths that can be pursued. On the first path there is a more aggressive approach to care whereby he may face coming back and forth to the hospital for ongoing complications from his chronic disease processes.  On the other path, one of comfort would allow Richard Hall to live out his final days as symptomatically free as possible. Reviewed his son could be with him throughout this time  as well as the other person(s) who are most important to him.   At this this juncture the question is one of quality of life. Richard Hall seemed to understand that and has elected after much discussion to pursue comfort care considering the different paths.   We talked about transition to comfort measures in house and what that would entail inclusive of medications to control pain, dyspnea, agitation, nausea, itching, and hiccups.    We discussed stopping all uneccessary measures such as cardiac monitoring, blood draws, needle sticks, and frequent vital signs.   Reviewed that once HD is stopped tim is limited to < 2 weeks which Richard Hall is aware of from prior conversations.  I was able to call Richard Hall's son, Richard Hall to share the above information with him. He states that he will honor whatever his fathers wishes are.   Plan for transition to IP hospice --> son prefers [facility]  Questions and concerns addressed   Palliative Support Provided.   Objective Assessment: Vital Signs Vitals:   07/02/21 0545 07/02/21 0803  BP:  (!) 109/57  Pulse: 63 65  Resp: 20 19  Temp:  97.6 F (36.4 C)  SpO2: 98% 100%    Intake/Output Summary (Last 24 hours) at 07/02/2021 1051 Last data filed at 07/01/2021 1310 Gross per 24 hour  Intake 715 ml  Output 3200 ml  Net -2485 ml    Last Weight  Most recent update: 07/02/2021  5:46 AM    Weight  122.7 kg (270 lb 8.1 oz)            Gen:  Frail elderly Caucasian M  HEENT: moist mucous membranes CV: Regular rate and irregular rhythm PULM: On 3LPM [place], (+) rhonchi ABD: soft/nontender EXT: No edema Neuro: Alert and oriented x3   SUMMARY OF RECOMMENDATIONS   DNAR/DNI   MOST / DNR Form Completed, paper copy placed onto the chart electric copy can be found in Vynca  Transition to comfort focused care  Medication per [facility]  TOC - Inpatient hospice referral  Ongoing support  Prognosis < 2 week  TimeL 70 minutes  MDM -  High ______________________________________________________________________________________ [place] Team Team Cell Phone: [phone] Please utilize secure chat with additional questions, if there is no response within 30 minutes please call the above phone number  Palliative Medicine Team providers are available by phone from 7am to 7pm daily and can be reached through the team cell phone.  Should this patient require assistance outside of these hours, please call the patient's attending physician.

## 2021-07-02 NOTE — TOC Progression Note (Signed)
Transition of Care [facility]) - Progression Note    Patient Details  Name: Richard Hall MRN: [id number] Date of Birth: 22-Dec-1955  Transition of [facility] CM/SW [place], LCSW Phone Number: 07/02/2021, 3:40 PM  Clinical Narrative:    CSW received referral for residential hospice placement. CSW notified Cheri with [facility]). She will review and come assess patient tomorrow.    Expected Discharge Plan: [place] Barriers to Discharge: Continued Medical Work up  Expected Discharge Plan and Services Expected Discharge Plan: [place] In-house Referral: Clinical Social Work   Post Acute Care Choice: [place] Living arrangements for the past 2 months: Single Family Home                                       Social Determinants of Health (SDOH) Interventions    Readmission Risk Interventions Readmission Risk Prevention Plan 06/21/2021 01/12/2019  Transportation Screening Complete Complete  PCP or Specialist Appt within 3-5 Days - Complete  HRI or [place] - Complete  Social Work Consult for [place] Planning/Counseling - Complete  Palliative Care Screening - Not Applicable  Medication Review Press photographer) Complete Complete  PCP or Specialist appointment within 3-5 days of discharge Complete -  [place] or Home Care Consult Complete -  SW Recovery Care/Counseling Consult Complete -  Palliative Care Screening Not Applicable -  Skilled Nursing Facility Complete -  Some recent data might be hidden

## 2021-07-02 NOTE — TOC Progression Note (Signed)
Transition of Care [facility]) - Progression Note    Patient Details  Name: Richard Hall MRN: [id number] Date of Birth: 08/09/1955  Transition of Care [facility]) CM/SW Contact  Carles Collet, RN Phone Number: 07/02/2021, 1:14 PM  Clinical Narrative:    [name] w patient and discussed palliative services and providers. He would like palliative services through [facility]. Spoke w Manus Gunning, and referral placed.  Added to [facility] information and notified CSW.     Expected Discharge Plan: [place] Barriers to Discharge: Continued Medical Work up  Expected Discharge Plan and Services Expected Discharge Plan: [place] In-house Referral: Clinical Social Work   Post Acute Care Choice: [place] Living arrangements for the past 2 months: Single Family Home                                       Social Determinants of Health (SDOH) Interventions    Readmission Risk Interventions Readmission Risk Prevention Plan 06/21/2021 01/12/2019  Transportation Screening Complete Complete  PCP or Specialist Appt within 3-5 Days - Complete  HRI or [place] - Complete  Social Work Consult for [place] Planning/Counseling - Complete  Palliative Care Screening - Not Applicable  Medication Review Press photographer) Complete Complete  PCP or Specialist appointment within 3-5 days of discharge Complete -  [place] or Home Care Consult Complete -  SW Recovery Care/Counseling Consult Complete -  Palliative Care Screening Not Applicable -  Skilled Nursing Facility Complete -  Some recent data might be hidden

## 2021-07-03 ENCOUNTER — Encounter (HOSPITAL_COMMUNITY)
Admission: EM | Disposition: A | Discharge status: Hospice | Payer: Self-pay | Source: Home / Self Care | Attending: Internal Medicine

## 2021-07-03 DIAGNOSIS — S82899A Other fracture of unspecified lower leg, initial encounter for closed fracture: Secondary | ICD-10-CM | POA: Diagnosis present

## 2021-07-03 DIAGNOSIS — J189 Pneumonia, unspecified organism: Secondary | ICD-10-CM

## 2021-07-03 DIAGNOSIS — E871 Hypo-osmolality and hyponatremia: Secondary | ICD-10-CM | POA: Diagnosis present

## 2021-07-03 DIAGNOSIS — I5033 Acute on chronic diastolic (congestive) heart failure: Secondary | ICD-10-CM

## 2021-07-03 DIAGNOSIS — J9612 Chronic respiratory failure with hypercapnia: Secondary | ICD-10-CM

## 2021-07-03 DIAGNOSIS — I482 Chronic atrial fibrillation, unspecified: Secondary | ICD-10-CM

## 2021-07-03 DIAGNOSIS — E1122 Type 2 diabetes mellitus with diabetic chronic kidney disease: Secondary | ICD-10-CM

## 2021-07-03 DIAGNOSIS — I5032 Chronic diastolic (congestive) heart failure: Secondary | ICD-10-CM

## 2021-07-03 DIAGNOSIS — J9611 Chronic respiratory failure with hypoxia: Secondary | ICD-10-CM

## 2021-07-03 DIAGNOSIS — Z66 Do not resuscitate: Secondary | ICD-10-CM

## 2021-07-03 SURGERY — ENTEROSCOPY
Anesthesia: Monitor Anesthesia Care

## 2021-07-03 MED ORDER — FENTANYL CITRATE PF 50 MCG/ML IJ SOSY
12.5000 ug | PREFILLED_SYRINGE | INTRAMUSCULAR | Status: DC | PRN
  Administered 2021-07-03: 12.5 ug via INTRAVENOUS
  Filled 2021-07-03: qty 1

## 2021-07-03 NOTE — Progress Notes (Signed)
PROGRESS NOTE                                                                                                                                                                                                             Patient Demographics:    Richard Hall, is a 66 y.o. male, DOB - 26-Dec-1955, WUJ:[id number]  Outpatient Primary MD for the patient is [name], MD    LOS - 18  Admit date - 06/15/2021    Chief Complaint  Patient presents with   Shortness of Breath       Brief Narrative (HPI from H&P)      Richard Hall is a 66 y.o. male with medical history significant of hypertension, atrial fibrillation, congestive heart failure, CVA, ESRD on HD, and prostate cancer presents with complaints of shortness of breath.  Patient had just recently been hospitalized from 12/30-1/13 after initially presenting with septic shock related to MSSA bacteremia and COVID-19 viral pneumonia.  During his hospital stay patient required intubation.  ID had been consulted and patient was recommended to complete 6 weeks of cefazolin to be completed on 07/17/2021.  Patient presented back to the hospital from rehab facility on 1/14 for evaluation of hypoxemia-he was found to have cavitary pneumonia due to probably aspiration, chronic GI bleeding with blood loss anemia requiring multiple units of PRBC transfusion.  After extensive discussion with palliative care medicine-patient was transitioned to full comfort measures on 1/31.    Subjective:   Lying comfortably in bed.   Assessment  & Plan :   Acute Hypoxic Resp. Failure: Multifactorial etiology-HFpEF exacerbation in the setting of ESRD, recent COVID-19 pneumonitis, cavitary lesion seen on CT chest from microaspiration.  Has been transitioned to full comfort measures on 1/31.  Acute on chronic diastolic CHF: Volume management was being done with HD-but due to soft blood pressure-this was  challenging.  Volume status remains relatively stable.  Transition to full comfort measures on 1/31-dialysis discontinued.  Awaiting residential hospice placement  Cavitary PNA: Suspicion for microaspiration-initially on IV Unasyn-after discussion with ID-plans were to continue cefazolin-however he has now been transitioned to full comfort measures-no longer on any antimicrobial therapy  Prostate cancer with metastasis to the bone: Followed by  Dr. Tresa Moore from urology, Dr. Alen Blew from oncology  ESRD: Nephrology followed closely-since transitioned to full comfort measures-no  plans for further HD.  Recent MSSA bacteremia: Cefazolin with a stop date of 07/17/2021 seen by ID this admission-all antibiotics have now been discontinued given transitioning to full comfort measures.  Dysphagia: Speech therapy following currently on dysphagia 2 diet.    Multifactorial anemia: Due to chronic GI bleeding (extensive GI work-up done at [facility] recently), underlying prostate malignancy and from underlying ESRD.  Has required numerous units of PRBC transfusion during this hospitalization.  GI followed closely.  No plans to further monitor CBC as he has been transitioned to full comfort measures.  Chronic A. fib [place] vas 2 score of greater than 4: Rate controlled-Eliquis on hold due to severity of anemia-concern for ongoing GI bleeding.  Recent left ankle fracture:  Nonweightbearing left leg per last admission, outpatient orthopedics follow-up.  Hypothyroidism:On Synthroid   Obesity with OSA: BMI 35 follow with PCP for weight loss nightly CPAP.  Counseled on compliance  Recent left ankle fracture: Nonweightbearing left leg per last admission, outpatient orthopedics follow-up.  PT OT.  Hypothyroidism:On Synthroid   Chronic A. fib [place] vas 2 score of greater than 4: Rate controlled-Eliquis on hold.  Hypotension:Blood pressure has stabilized with midodrine.  No longer on midodrine once transition  to comfort measures.  Dyslipidemia: No longer on statin-once transitioned to comfort measures  Hyponatremia: Due to excessive volume-plans were to manage with HD.  Elevated troponin: Likely demand ischemia in the setting of ESRD, demand mismatch caused by hypoxia from #1 above, chest pain-free.  Cardiology was consulted during this hospital stay.  Needs conservatively.  Severe MR: Plans were for outpatient cardiology follow-up.   History of COVID-19 infection in the past: Repeat test on 06/25/2021 is still positive but cycle threshold is 39  i.e. he is not contagious and this is not an active infection.    DM type II: No longer on insulin.   Lab Results  Component Value Date   HGBA1C 6.1 (H) 06/01/2021    CBG (last 3)  Recent Labs    07/01/21 2041 07/02/21 0802 07/02/21 1142  GLUCAP 193* 97 136*      Goals of care:Patient with very poor functional status, weak, deconditioned, HD dependent, difficult to dialyze due to low blood pressure-remaining with significant volume overload.  His overall prognosis was felt to be very poor.  Patient was followed closely by numerous physicians including palliative care team-after extensive discussion-he was transitioned to full comfort measures on 1/31.  Awaiting residential hospice placement.        Condition - Extremely Guarded  Family Communication  : None at bedside  Code Status : DNR  Consults  : Cardiology, Pulmonary, renal, Palliative ,GI  PUD Prophylaxis :    Procedures  :     CT Scan abdomen pelvis repeat on 06/25/2021 without contrast.  No evidence of retroperitoneal bleed.  CT scan - 1. The appearance of the chest suggest congestive heart failure, as above. 2. In addition, there is a new thick-walled cavitary nodule measuring 1.4 x 1.2 cm in the right upper lobe. This is potentially infectious or inflammatory in etiology, however, underlying neoplasm is not excluded. Short-term follow-up repeat noncontrast chest CT is  recommended in 2-3 months to ensure the stability or regression of this lesion. 3. Mildly enlarged right paratracheal lymph node, nonspecific in the setting of presumed congestive heart failure, but concerning in light of the right upper lobe pulmonary nodule. Attention at time of repeat CT examination is recommended to ensure stability/regression. 4. Diffuse sclerotic lesions noted  throughout the visualized axial and appendicular skeleton, presumably reflective of widespread osseous metastasis in this patient with history of prostate cancer. 5. Aortic atherosclerosis, in addition to left main and three-vessel coronary artery disease. Please note that although the presence of coronary artery calcium documents the presence of coronary artery disease, the severity of this disease and any potential stenosis cannot be assessed on this non-gated CT examination. Assessment for potential risk factor modification, dietary therapy or pharmacologic therapy may be warranted, if clinically indicated. 6. There are calcifications of the aortic valve. Echocardiographic correlation for evaluation of potential valvular dysfunction may be warranted if clinically indicated. Aortic Atherosclerosis (ICD10-I70.0).      Disposition Plan  :    Status is: Inpatient  Remains inpatient appropriate because: Awaiting residential hospice placement. DVT Prophylaxis  :         Lab Results  Component Value Date   PLT 210 07/02/2021    Diet :  Diet Order             Diet renal with fluid restriction Fluid restriction: 1200 mL Fluid; Room service appropriate? Yes; Fluid consistency: Thin  Diet effective now                    Inpatient Medications  Scheduled Meds:  amitriptyline  100 mg Oral QHS   Continuous Infusions:   PRN Meds:.acetaminophen **OR** acetaminophen, albuterol, antiseptic oral rinse, calcium carbonate, diphenhydrAMINE, diphenoxylate-atropine, glycopyrrolate **OR** glycopyrrolate **OR**  glycopyrrolate, guaiFENesin, haloperidol **OR** haloperidol **OR** haloperidol lactate, HYDROmorphone (DILAUDID) injection, LORazepam, ondansetron (ZOFRAN) IV, polyvinyl alcohol      Oren Binet M.D on 07/03/2021 at 11:23 AM  To page go to [email or web address]   [facility] -  Office  [phone]  See all Orders from today for further details    Objective:   Vitals:   07/02/21 1145 07/02/21 1936 07/02/21 2347 07/03/21 1018  BP: (!) 104/55 99/67 103/61   Pulse: 62 60 60   Resp: 20 18 16    Temp: 97.6 F (36.4 C) 97.6 F (36.4 C) 97.6 F (36.4 C)   TempSrc: Axillary Oral Oral   SpO2: 98% 96% 98% 99%  Weight:      Height:        Wt Readings from Last 3 Encounters:  07/02/21 122.7 kg  06/14/21 117.6 kg  07/26/20 125.3 kg    No intake or output data in the 24 hours ending 07/03/21 1123      Physical Exam  Awake Alert, Oriented X 3, patient extremely frail, deconditioned and chronically ill-appearing, appears older than stated age Symmetrical Chest wall movement, Minister entry bilaterally RRR,No Gallops,Rubs or new Murmurs, No Parasternal Heave +ve B.Sounds, Abd Soft, No tenderness, No rebound - guarding or rigidity. No Cyanosis, Clubbing ,+2  edema, No new Rash or bruise     RN pressure injury documentation: Pressure Injury 06/11/21 Coccyx Medial Stage 2 -  Partial thickness loss of dermis presenting as a shallow open injury with a red, pink wound bed without slough. (Active)  06/11/21 2159  Location: Coccyx  Location Orientation: Medial  Staging: Stage 2 -  Partial thickness loss of dermis presenting as a shallow open injury with a red, pink wound bed without slough.  Wound Description (Comments):   Present on Admission:      Data Review:   Recent Labs  Lab 06/28/21 0112 06/29/21 0423 06/30/21 0303 07/01/21 0040 07/02/21 0313  WBC 6.6 6.5 8.7 7.3 6.9  HGB 7.3* 8.1* 7.8* 7.4* 7.3*  HCT  23.8* 26.0* 26.0* 25.1* 23.8*  PLT 141* 160 185 189 210   MCV 96.0 94.5 96.7 96.9 92.6  MCH 29.4 29.5 29.0 28.6 28.4  MCHC 30.7 31.2 30.0 29.5* 30.7  RDW 20.3* 21.2* 20.9* 20.8* 22.4*     Recent Labs  Lab 06/28/21 0112 06/29/21 0051 06/30/21 0303 07/01/21 0040 07/02/21 0313  NA 127* 130* 128* 127* 124*  K 4.5 3.8 3.9 4.1 4.2  CL 90* 93* 91* 92* 89*  CO2 27 29 26 25 26   GLUCOSE 154* 156* 194* 197* 95  BUN 46* 30* 43* 56* 38*  CREATININE 5.54* 3.90* 5.31* 6.33* 5.02*  CALCIUM 8.0* 7.9* 8.2* 8.0* 7.9*  ALBUMIN 1.8* 1.8* 1.9* 1.9* 1.8*      Radiology Reports No results found.   Patient ID: [name], male   DOB: Aug 07, 1955, 66 y.o.   MRN: [id number] Patient ID: Demetrice Combes, male   DOB: 1955/12/15, 66 y.o.   MRN: [id number] Patient ID: Aydien Majette, male   DOB: 03-24-1956, 66 y.o.   MRN: [id number] Patient ID: Joseantonio Dittmar, male   DOB: [birth year], 66 y.o.   MRN: [id number] Patient ID: Akito Boomhower, male   DOB: 1956-02-24, 66 y.o.   MRN: [id number] Patient ID: Errin Chewning, male   DOB: 1955/10/07, 66 y.o.   MRN: [id number] Patient ID: Winnie Barsky, male   DOB: [birth year], 66 y.o.   MRN: [id number]

## 2021-07-03 NOTE — Progress Notes (Signed)
Received message from palliative care that patient is transitioning to comfort care and stopping dialysis. Nephrology will sign off. Please call us if we can be of further assistance.  Anice Paganini, PA-C 07/03/2021, 8:07 AM  [facility] Pager: [phone]

## 2021-07-03 NOTE — Progress Notes (Signed)
Progress note:  After reviewing the patient's chart, I assessed the patient at bedside.  He is having pain 6/10 and endorses shortness of breath with regular conversation.  Nasal cannula is in place.  Patient is in no respiratory distress but having increased shortness of breath with increased work of breathing.  After assessing the patient and speaking with him regarding his needs, I spoke with nursing.  I encouraged nursing to give Dilaudid IV and nebulizer treatment x1.  During afternoon rounding, assessed the patient post pain medication and nebulizer treatment.  Patient endorsed nebulizer treatment did not help with relief of increased work of breathing.  He also endorsed that pain was the same before and after administration of IV pain medication.  Discussed importance of pain control as well as increased work of breathing to help conserve energy.  Patient denies anxiety.  Fentanyl 12.5 mcg IV every 2 hours as needed added and nursing asked to give immediately.  Patient understands that he is going to be transferred to a hospice inpatient facility when a bed is available.  Therapeutic silence and active listening provided for patient and son to share their thoughts and emotions regarding patient's current health status.  By medicine team will continue to follow patient throughout his hospitalization.  [place] Ilsa Iha, FNP-BC Palliative Medicine Team Team Phone # [phone]  Greater than 50% of this time was spent counseling and coordinating care related to the above assessment and plan.

## 2021-07-03 NOTE — TOC Progression Note (Signed)
Transition of Care [facility]) - Progression Note    Patient Details  Name: Richard Hall MRN: [id number] Date of Birth: 04/13/1956  Transition of [facility]) CM/SW [place], LCSW Phone Number: 07/03/2021, 12:10 PM  Clinical Narrative:    Hospice met with patient and son at bedside however son is requesting [place] location, which does not have a bed available today.    Expected Discharge Plan: [place] Barriers to Discharge: Continued Medical Work up  Expected Discharge Plan and Services Expected Discharge Plan: [place] In-house Referral: Clinical Social Work   Post Acute Care Choice: [place] Living arrangements for the past 2 months: Single Family Home                                       Social Determinants of Health (SDOH) Interventions    Readmission Risk Interventions Readmission Risk Prevention Plan 06/21/2021 01/12/2019  Transportation Screening Complete Complete  PCP or Specialist Appt within 3-5 Days - Complete  HRI or [place] - Complete  Social Work Consult for [place] Planning/Counseling - Complete  Palliative Care Screening - Not Applicable  Medication Review Press photographer) Complete Complete  PCP or Specialist appointment within 3-5 days of discharge Complete -  [place] or Home Care Consult Complete -  SW Recovery Care/Counseling Consult Complete -  Palliative Care Screening Not Applicable -  Skilled Nursing Facility Complete -  Some recent data might be hidden

## 2021-07-03 NOTE — Assessment & Plan Note (Signed)
Multifactorial etiology-HFpEF exacerbation in the setting of ESRD, recent COVID-19 pneumonitis, cavitary lesion seen on CT chest from microaspiration.  Has been transitioned to full comfort measures on 1/31.

## 2021-07-03 NOTE — Progress Notes (Signed)
This chaplain responded to PMT consult for EOL spiritual care. The Pt. is awake and accepted the chaplain's presence. The Pt. RN-Olayinka was updated after the visit.  The chaplain understands the Pt. faith and the presence of God's love is present in the Pt. daily life. For the Pt., God's love is a source of forgiveness as the Pt. reflects on his life and desire to be with his family as he experiences EOL. The Pt. shares the difficulty in having family together because of their health and separation.  The chaplain will continue to explore ways to recognize the Pt. desire outside of family presence. The Pt. accepted the chaplain's invitation for prayer and is open to F/U spiritual care.  The Pt. shares his pain is at a level 5 at the time of the visit. The Pt. adds he remains curious "why he feels worse than he did when he was admitted? The chaplain recognizes the amount of oxygen the Pt. is using to have a conversation with the chaplain. The Pt. recognizes the place for rest as he waits for his coffee to cool.  Chaplain Sallyanne Kuster [phone]

## 2021-07-03 NOTE — Progress Notes (Signed)
° °  I have met with pt and his son [name] at bedside. We were able to discuss the transition from Hospital care to the hospice facility care. The son is refusing for pt to go to the [facility]. He states that his dad lived in [place] but the Hospice home in [place] is much closer in proximity to the family that would visit and only a couple miles from his house. I explained that the care at each facility would mirror each other and goals of care were discussed. He is agreement with the Hospice home in [place] and wants to proceed with referral process. Unfortunately today we do not have an available bed to offer. I will continue to follow him until one is available and will assist with d/c needs. Webb Silversmith RN

## 2021-07-04 NOTE — Progress Notes (Signed)
PROGRESS NOTE                                                                                                                                                                                                             Patient Demographics:    Richard Hall, is a 66 y.o. male, DOB - [birth year], IRW:[id number]  Outpatient Primary MD for the patient is [name], MD    LOS - 54  Admit date - 06/15/2021    Chief Complaint  Patient presents with   Shortness of Breath       Brief Narrative (HPI from H&P)      Richard Hall is a 66 y.o. male with medical history significant of hypertension, atrial fibrillation, congestive heart failure, CVA, ESRD on HD, and prostate cancer presents with complaints of shortness of breath.  Patient had just recently been hospitalized from 12/30-1/13 after initially presenting with septic shock related to MSSA bacteremia and COVID-19 viral pneumonia.  During his hospital stay patient required intubation.  ID had been consulted and patient was recommended to complete 6 weeks of cefazolin to be completed on 07/17/2021.  Patient presented back to the hospital from rehab facility on 1/14 for evaluation of hypoxemia-he was found to have cavitary pneumonia due to probably aspiration, chronic GI bleeding with blood loss anemia requiring multiple units of PRBC transfusion.  After extensive discussion with palliative care medicine-patient was transitioned to full comfort measures on 1/31.    Subjective:   Appears frail/weak-eating breakfast when I walked in.  Feels somewhat nauseous.  Awaiting residential hospice placement   Assessment  & Plan :   Acute Hypoxic Resp. Failure: Multifactorial etiology-HFpEF exacerbation in the setting of ESRD, recent COVID-19 pneumonitis, cavitary lesion seen on CT chest from microaspiration.  Has been transitioned to full comfort measures on 1/31.  Acute on chronic  diastolic CHF: Volume management was being done with HD-but due to soft blood pressure-this was challenging.  Volume status remains relatively stable.  Transition to full comfort measures on 1/31-dialysis discontinued.  Awaiting residential hospice placement  Cavitary PNA: Suspicion for microaspiration-initially on IV Unasyn-after discussion with ID-plans were to continue cefazolin-however he has now been transitioned to full comfort measures-no longer on any antimicrobial therapy  Prostate cancer with metastasis to the bone: Followed by  Dr. Tresa Moore from urology, Dr. Alen Blew  from oncology  ESRD: Nephrology followed closely-since transitioned to full comfort measures-no plans for further HD.  Recent MSSA bacteremia: Cefazolin with a stop date of 07/17/2021 seen by ID this admission-all antibiotics have now been discontinued given transitioning to full comfort measures.  Dysphagia: Speech therapy following currently on dysphagia 2 diet.    Multifactorial anemia: Due to chronic GI bleeding (extensive GI work-up done at [facility] recently), underlying prostate malignancy and from underlying ESRD.  Has required numerous units of PRBC transfusion during this hospitalization.  GI followed closely.  No plans to further monitor CBC as he has been transitioned to full comfort measures.  Chronic A. fib [place] vas 2 score of greater than 4: Rate controlled-Eliquis on hold due to severity of anemia-concern for ongoing GI bleeding.  Recent left ankle fracture:  Nonweightbearing left leg per last admission, outpatient orthopedics follow-up.  Hypothyroidism:On Synthroid   Obesity with OSA: BMI 35 follow with PCP for weight loss nightly CPAP.  Counseled on compliance  Recent left ankle fracture: Nonweightbearing left leg per last admission, outpatient orthopedics follow-up.  PT OT.  Hypothyroidism:On Synthroid   Chronic A. fib [place] vas 2 score of greater than 4: Rate controlled-Eliquis on  hold.  Hypotension:Blood pressure has stabilized with midodrine.  No longer on midodrine once transition to comfort measures.  Dyslipidemia: No longer on statin-once transitioned to comfort measures  Hyponatremia: Due to excessive volume-plans were to manage with HD.  Elevated troponin: Likely demand ischemia in the setting of ESRD, demand mismatch caused by hypoxia from #1 above, chest pain-free.  Cardiology was consulted during this hospital stay.  Needs conservatively.  Severe MR: Plans were for outpatient cardiology follow-up.   History of COVID-19 infection in the past: Repeat test on 06/25/2021 is still positive but cycle threshold is 39  i.e. he is not contagious and this is not an active infection.    DM type II: No longer on insulin.   Lab Results  Component Value Date   HGBA1C 6.1 (H) 06/01/2021    CBG (last 3)  Recent Labs    07/01/21 2041 07/02/21 0802 07/02/21 1142  GLUCAP 193* 97 136*      Goals of care:Patient with very poor functional status, weak, deconditioned, HD dependent, difficult to dialyze due to low blood pressure-remaining with significant volume overload.  His overall prognosis was felt to be very poor.  Patient was followed closely by numerous physicians including palliative care team-after extensive discussion-he was transitioned to full comfort measures on 1/31.  Awaiting residential hospice placement.        Condition - Extremely Guarded  Family Communication  : None at bedside  Code Status : DNR  Consults  : Cardiology, Pulmonary, renal, Palliative ,GI  PUD Prophylaxis :    Procedures  :     CT Scan abdomen pelvis repeat on 06/25/2021 without contrast.  No evidence of retroperitoneal bleed.  CT scan - 1. The appearance of the chest suggest congestive heart failure, as above. 2. In addition, there is a new thick-walled cavitary nodule measuring 1.4 x 1.2 cm in the right upper lobe. This is potentially infectious or inflammatory in  etiology, however, underlying neoplasm is not excluded. Short-term follow-up repeat noncontrast chest CT is recommended in 2-3 months to ensure the stability or regression of this lesion. 3. Mildly enlarged right paratracheal lymph node, nonspecific in the setting of presumed congestive heart failure, but concerning in light of the right upper lobe pulmonary nodule. Attention at time of repeat  CT examination is recommended to ensure stability/regression. 4. Diffuse sclerotic lesions noted throughout the visualized axial and appendicular skeleton, presumably reflective of widespread osseous metastasis in this patient with history of prostate cancer. 5. Aortic atherosclerosis, in addition to left main and three-vessel coronary artery disease. Please note that although the presence of coronary artery calcium documents the presence of coronary artery disease, the severity of this disease and any potential stenosis cannot be assessed on this non-gated CT examination. Assessment for potential risk factor modification, dietary therapy or pharmacologic therapy may be warranted, if clinically indicated. 6. There are calcifications of the aortic valve. Echocardiographic correlation for evaluation of potential valvular dysfunction may be warranted if clinically indicated. Aortic Atherosclerosis (ICD10-I70.0).      Disposition Plan  :    Status is: Inpatient  Remains inpatient appropriate because: Awaiting residential hospice placement. DVT Prophylaxis  :         Lab Results  Component Value Date   PLT 210 07/02/2021    Diet :  Diet Order             Diet renal with fluid restriction Fluid restriction: 1200 mL Fluid; Room service appropriate? Yes; Fluid consistency: Thin  Diet effective now                    Inpatient Medications  Scheduled Meds:  amitriptyline  100 mg Oral QHS   Continuous Infusions:   PRN Meds:.acetaminophen **OR** acetaminophen, albuterol, antiseptic oral rinse,  calcium carbonate, diphenhydrAMINE, diphenoxylate-atropine, fentaNYL (SUBLIMAZE) injection, glycopyrrolate **OR** glycopyrrolate **OR** glycopyrrolate, guaiFENesin, haloperidol **OR** haloperidol **OR** haloperidol lactate, HYDROmorphone (DILAUDID) injection, LORazepam, ondansetron (ZOFRAN) IV, polyvinyl alcohol      Richard Hall M.D on 07/04/2021 at 12:05 PM  To page go to [email or web address]   [facility] -  Office  [phone]  See all Orders from today for further details    Objective:   Vitals:   07/03/21 1018 07/03/21 1227 07/03/21 1318 07/04/21 0127  BP:    110/61  Pulse:    66  Resp:    17  Temp:   (!) 97.5 F (36.4 C) (!) 96.6 F (35.9 C)  TempSrc:   Axillary Axillary  SpO2: 99% 98%  90%  Weight:      Height:        Wt Readings from Last 3 Encounters:  07/02/21 122.7 kg  06/14/21 117.6 kg  07/26/20 125.3 kg    No intake or output data in the 24 hours ending 07/04/21 1205      Physical Exam  Awake Alert, Oriented X 3, patient extremely frail, deconditioned and chronically ill-appearing, appears older than stated age Symmetrical Chest wall movement, Minister entry bilaterally RRR,No Gallops,Rubs or new Murmurs, No Parasternal Heave +ve B.Sounds, Abd Soft, No tenderness, No rebound - guarding or rigidity. No Cyanosis, Clubbing ,+2  edema, No new Rash or bruise     RN pressure injury documentation: Pressure Injury 06/11/21 Coccyx Medial Stage 2 -  Partial thickness loss of dermis presenting as a shallow open injury with a red, pink wound bed without slough. (Active)  06/11/21 2159  Location: Coccyx  Location Orientation: Medial  Staging: Stage 2 -  Partial thickness loss of dermis presenting as a shallow open injury with a red, pink wound bed without slough.  Wound Description (Comments):   Present on Admission:      Data Review:   Recent Labs  Lab 06/28/21 0112 06/29/21 0423 06/30/21 0303 07/01/21 0040 07/02/21 0313  WBC 6.6  6.5 8.7 7.3  6.9  HGB 7.3* 8.1* 7.8* 7.4* 7.3*  HCT 23.8* 26.0* 26.0* 25.1* 23.8*  PLT 141* 160 185 189 210  MCV 96.0 94.5 96.7 96.9 92.6  MCH 29.4 29.5 29.0 28.6 28.4  MCHC 30.7 31.2 30.0 29.5* 30.7  RDW 20.3* 21.2* 20.9* 20.8* 22.4*     Recent Labs  Lab 06/28/21 0112 06/29/21 0051 06/30/21 0303 07/01/21 0040 07/02/21 0313  NA 127* 130* 128* 127* 124*  K 4.5 3.8 3.9 4.1 4.2  CL 90* 93* 91* 92* 89*  CO2 27 29 26 25 26   GLUCOSE 154* 156* 194* 197* 95  BUN 46* 30* 43* 56* 38*  CREATININE 5.54* 3.90* 5.31* 6.33* 5.02*  CALCIUM 8.0* 7.9* 8.2* 8.0* 7.9*  ALBUMIN 1.8* 1.8* 1.9* 1.9* 1.8*      Radiology Reports No results found.   Patient ID: Richard Hall, male   DOB: 04-Jun-1955, 66 y.o.   MRN: [id number] Patient ID: Richard Hall, male   DOB: 03/05/1956, 66 y.o.   MRN: [id number] Patient ID: Richard Hall, male   DOB: [birth year], 66 y.o.   MRN: [id number] Patient ID: Richard Hall, male   DOB: 05-17-1956, 66 y.o.   MRN: [id number] Patient ID: Richard Hall, male   DOB: May 20, 1956, 66 y.o.   MRN: [id number] Patient ID: Richard Hall, male   DOB: 1955-07-14, 66 y.o.   MRN: [id number] Patient ID: Richard Hall, male   DOB: 05-Jun-1955, 66 y.o.   MRN: [id number]

## 2021-07-04 NOTE — Progress Notes (Signed)
° °  We continue to follow along with pt and are prepared to proceed with referral process of moving pt to the Hospice Home in [facility] when bed available. Son continues to prefer the Hospice home in [place] over the [facility] facility Thank you   Webb Silversmith RN [phone]

## 2021-07-05 DIAGNOSIS — R0600 Dyspnea, unspecified: Secondary | ICD-10-CM

## 2021-07-05 DIAGNOSIS — I482 Chronic atrial fibrillation, unspecified: Secondary | ICD-10-CM

## 2021-07-05 MED ORDER — HYDROMORPHONE HCL 1 MG/ML IJ SOLN
0.5000 mg | INTRAMUSCULAR | 0 refills | Status: DC | PRN
Start: 2021-07-05 — End: 2023-08-28

## 2021-07-05 MED ORDER — HALOPERIDOL 0.5 MG PO TABS: 0.5000 mg | ORAL_TABLET | ORAL | Status: DC | PRN

## 2021-07-05 MED ORDER — GLYCOPYRROLATE 1 MG PO TABS: 1.0000 mg | ORAL_TABLET | ORAL | Status: DC | PRN

## 2021-07-05 MED ORDER — ACETAMINOPHEN 325 MG PO TABS: 650.0000 mg | ORAL_TABLET | Freq: Four times a day (QID) | ORAL | Status: DC | PRN

## 2021-07-05 MED ORDER — ALBUTEROL SULFATE (2.5 MG/3ML) 0.083% IN NEBU: 2.5000 mg | INHALATION_SOLUTION | RESPIRATORY_TRACT | 12 refills | Status: DC | PRN

## 2021-07-05 MED ORDER — LORAZEPAM 2 MG/ML IJ SOLN: 0.5000 mg | INTRAMUSCULAR | 0 refills | Status: DC | PRN

## 2021-07-05 NOTE — Progress Notes (Signed)
Patient ID: Richard Hall, male   DOB: 1955-09-23, 66 y.o.   MRN: [id number]    Progress Note from the Palliative Medicine Team at [facility]   Patient Name: Richard Hall        Date: 07/05/2021 DOB: [birth year]  Age: 66 y.o. MRN#: [id number] Attending Physician: Jonetta Osgood, MD Primary Care Physician: [name], MD Admit Date: 06/15/2021   Medical records reviewed   Richard Hall is a 66 y.o. male with medical history significant of hypertension, atrial fibrillation, congestive heart failure, CVA, ESRD on HD, and prostate cancer presents with complaints of shortness of breath.  Patient had just recently been hospitalized from 12/30-1/13 after initially presenting with septic shock related to MSSA bacteremia and COVID-19 viral pneumonia.  During his hospital stay patient required intubation.  ID had been consulted and patient was recommended to complete 6 weeks of cefazolin to be completed on 07/17/2021.  Patient presented back to the hospital from rehab facility on 1/14 for evaluation of hypoxemia-he was found to have cavitary pneumonia due to probably aspiration, chronic GI bleeding with blood loss anemia requiring multiple units of PRBC transfusion.  After extensive discussion with palliative care medicine-patient was transitioned to full comfort measures on 1/31.   This NP visited patient at the bedside as a follow up for palliative medicine needs and emotional support.     Reviewed medications for efficacy. Patient appears comfortable.    Plan of care: - DNR/DNI -No artificial feeding now or in the future. -Symptom management     -Ativan 0.5 to 1 mg IV every 1 hour as needed for anxiety     -Dilaudid 0.5 to 1 mg IV every 1 hour as needed for pain or dyspnea -Focus of care is comfort and dignity at end-of-life -Awaits transport to residential hospice facility--- prognosis is less than 2 weeks  Questions and concerns addressed   Discussed with Dr Sloan Leiter and bedside  RN    Wadie Lessen NP  Palliative Medicine Team Team Phone # [phone] Pager [phone]

## 2021-07-05 NOTE — Progress Notes (Signed)
Report given to nurse at [facility].

## 2021-07-05 NOTE — Progress Notes (Signed)
[facility] transport arrived for patient to transport. PIV left in per instruction.  Pt will be transported on 3 LPM [place].  Discharge paperwork given to transport.

## 2021-07-05 NOTE — TOC Transition Note (Signed)
Transition of Care [facility]) - CM/SW Discharge Note   Patient Details  Name: Richard Hall MRN: [id number] Date of Birth: Nov 20, 1955  [facility] of [facility] CM/SW Contact:  Benard Halsted, LCSW Phone Number: 07/05/2021, 12:06 PM   Clinical Narrative:    Patient will DC to: [facility] Anticipated DC date: 07/05/21 Family notified: Son at bedside Transport by: Glenna Fellows   Per MD patient ready for DC to [facility]. RN to call report prior to discharge [phone]). RN, patient, patient's family, and facility notified of DC. Discharge Summary and FL2 sent to facility. DC packet on chart. Ambulance transport requested for patient.   CSW will sign off for now as social work intervention is no longer needed. Please consult [place] again if new needs arise.     Final next level of care: [place] Barriers to Discharge: Barriers Resolved   Patient Goals and CMS Choice Patient states their goals for this hospitalization and ongoing recovery are:: Rehab CMS Medicare.gov Compare Post Acute Care list provided to:: Patient Represenative (must comment) Choice offered to / list presented to : Adult Children  Discharge Placement                Patient to be transferred to facility by: [place] Name of family member notified: Son Patient and family notified of of transfer: 07/05/21  Discharge Plan and Services In-house Referral: Clinical Social Work   Post Acute Care Choice: [place]                               Social Determinants of Health (SDOH) Interventions     Readmission Risk Interventions Readmission Risk Prevention Plan 06/21/2021 01/12/2019  Transportation Screening Complete Complete  PCP or Specialist Appt within 3-5 Days - Complete  HRI or [place] - Complete  Social Work Consult for [place] Planning/Counseling - Complete  Palliative Care Screening - Not Applicable  Medication  Review Press photographer) Complete Complete  PCP or Specialist appointment within 3-5 days of discharge Complete -  [place] or Home Care Consult Complete -  SW Recovery Care/Counseling Consult Complete -  Palliative Care Screening Not Applicable -  Skilled Nursing Facility Complete -  Some recent data might be hidden

## 2021-07-05 NOTE — Discharge Summary (Signed)
PATIENT DETAILS Name: Richard Hall Age: 66 y.o. Sex: male Date of Birth: 1955-11-19 MRN: [id number]. Admitting Physician: Norval Morton, MD [id number], Ria Bush, MD  Admit Date: 06/15/2021 Discharge date: 07/05/2021  Recommendations for Outpatient Follow-up:  Optimize comfort care  Admitted From:  Home  Disposition: Hospice care   Discharge Condition: poor  CODE STATUS:   Code Status: DNR   Diet recommendation:  Diet Order             Diet general           Diet renal with fluid restriction Fluid restriction: 1200 mL Fluid; Room service appropriate? Yes; Fluid consistency: Thin  Diet effective now                    Brief Summary: Richard Hall is a 66 y.o. male with medical history significant of hypertension, atrial fibrillation, congestive heart failure, CVA, ESRD on HD, and prostate cancer presents with complaints of shortness of breath.  Patient had just recently been hospitalized from 12/30-1/13 after initially presenting with septic shock related to MSSA bacteremia and COVID-19 viral pneumonia.  During his hospital stay patient required intubation.  ID had been consulted and patient was recommended to complete 6 weeks of cefazolin to be completed on 07/17/2021.  Patient presented back to the hospital from rehab facility on 1/14 for evaluation of hypoxemia-he was found to have cavitary pneumonia due to probably aspiration, chronic GI bleeding with blood loss anemia requiring multiple units of PRBC transfusion.  After extensive discussion with palliative care medicine-patient was transitioned to full comfort measures on 1/31.    Brief Hospital Course: Acute Hypoxic Resp. Failure: Multifactorial etiology-HFpEF exacerbation in the setting of ESRD, recent COVID-19 pneumonitis, cavitary lesion seen on CT chest from microaspiration.  Has been transitioned to full comfort measures on 1/31.   Acute on chronic diastolic CHF: Volume management was being done with  HD-but due to soft blood pressure-this was challenging.  Volume status remains relatively stable.  Transition to full comfort measures on 1/31-dialysis discontinued.  Awaiting residential hospice placement   Cavitary PNA: Suspicion for microaspiration-initially on IV Unasyn-after discussion with ID-plans were to continue cefazolin-however he has now been transitioned to full comfort measures-no longer on any antimicrobial therapy   Prostate cancer with metastasis to the bone: Followed by  Dr. Tresa Moore from urology, Dr. Alen Blew from oncology   ESRD: Nephrology followed closely-since transitioned to full comfort measures-no plans for further HD.   Recent MSSA bacteremia: Cefazolin with a stop date of 07/17/2021 seen by ID this admission-all antibiotics have now been discontinued given transitioning to full comfort measures.   Dysphagia: Speech therapy following currently on dysphagia 2 diet.     Multifactorial anemia: Due to chronic GI bleeding (extensive GI work-up done at [facility] recently), underlying prostate malignancy and from underlying ESRD.  Has required numerous units of PRBC transfusion during this hospitalization.  GI followed closely.  No plans to further monitor CBC as he has been transitioned to full comfort measures.   Chronic A. fib Mali vas 2 score of greater than 4: Rate controlled-Eliquis on hold due to severity of anemia-concern for ongoing GI bleeding.   Recent left ankle fracture:  Nonweightbearing left leg per last admission, outpatient orthopedics follow-up.   Hypothyroidism:On Synthroid    Obesity with OSA: BMI 35 follow with PCP for weight loss nightly CPAP.  Counseled on compliance   Recent left ankle fracture: Nonweightbearing left leg per last admission, outpatient  orthopedics follow-up.  PT OT.   Hypothyroidism:On Synthroid    Chronic A. fib Mali vas 2 score of greater than 4: Rate controlled-Eliquis on hold.   Hypotension:Blood pressure has stabilized  with midodrine.  No longer on midodrine once transition to comfort measures.   Dyslipidemia: No longer on statin-once transitioned to comfort measures   Hyponatremia: Due to excessive volume-plans were to manage with HD.   Elevated troponin: Likely demand ischemia in the setting of ESRD, demand mismatch caused by hypoxia from #1 above, chest pain-free.  Cardiology was consulted during this hospital stay.  Needs conservatively.   Severe MR: Plans were for outpatient cardiology follow-up.    History of COVID-19 infection in the past: Repeat test on 06/25/2021 is still positive but cycle threshold is 39  i.e. he is not contagious and this is not an active infection.     DM type II: No longer on insulin.  Goals of care:Patient with very poor functional status, weak, deconditioned, HD dependent, difficult to dialyze due to low blood pressure-remaining with significant volume overload.  His overall prognosis was felt to be very poor.  Patient was followed closely by numerous physicians including palliative care team-after extensive discussion-he was transitioned to full comfort measures on 1/31.  Awaiting residential hospice placement.     Obesity: Estimated body mass index is 36.69 kg/m as calculated from the following:   Height as of this encounter: 6' (1.829 m).   Weight as of this encounter: 122.7 kg.   RN pressure injury documentation: Pressure Injury 06/11/21 Coccyx Medial Stage 2 -  Partial thickness loss of dermis presenting as a shallow open injury with a red, pink wound bed without slough. (Active)  06/11/21 2159  Location: Coccyx  Location Orientation: Medial  Staging: Stage 2 -  Partial thickness loss of dermis presenting as a shallow open injury with a red, pink wound bed without slough.  Wound Description (Comments):   Present on Admission:      Discharge Diagnoses:  Principal Problem:   Acute on chronic respiratory failure with hypoxia (HCC) Active Problems:   Acute on  chronic diastolic CHF (congestive heart failure) (HCC)   Pneumonia with cavity of lung   Pneumonia due to COVID-19 virus   Symptomatic anemia   ESRD (end stage renal disease) on dialysis (HCC)   Hyperkalemia   Hyponatremia   MSSA bacteremia   SIRS (systemic inflammatory response syndrome) (HCC)   Malnutrition of moderate degree   Diabetes mellitus with end-stage renal disease (HCC)   OSA on CPAP   Hypothyroidism (acquired)   Essential hypertension   Dyslipidemia   Chronic diastolic congestive heart failure (HCC)   Chronic respiratory failure with hypoxia and hypercapnia (HCC)   Atrial fibrillation, chronic (HCC)   Prostate cancer metastatic to bone (HCC)   Elevated troponin   Pressure injury of skin   Fluid overload   Left Ankle fracture   Discharge Instructions:  Activity:  As tolerated with Full fall precautions use walker/cane & assistance as needed   Discharge Instructions     Diet general   Complete by: As directed    Increase activity slowly   Complete by: As directed    No wound care   Complete by: As directed       Allergies as of 07/05/2021       Reactions   Gabapentin Swelling        Medication List     STOP taking these medications    amitriptyline 100 MG  tablet Commonly known as: ELAVIL   atorvastatin 80 MG tablet Commonly known as: LIPITOR   ceFAZolin  IVPB Commonly known as: ANCEF   diphenoxylate-atropine 2.5-0.025 MG tablet Commonly known as: LOMOTIL   Eliquis 2.5 MG Tabs tablet Generic drug: apixaban   furosemide 80 MG tablet Commonly known as: LASIX   guaiFENesin 600 MG 12 hr tablet Commonly known as: MUCINEX   insulin glargine-yfgn 100 UNIT/ML injection Commonly known as: SEMGLEE   ipratropium-albuterol 0.5-2.5 (3) MG/3ML Soln Commonly known as: DUONEB   leuprolide acetate (6 Month) 45 MG injection Generic drug: leuprolide (6 Month)   levothyroxine 100 MCG tablet Commonly known as: SYNTHROID   lidocaine-prilocaine  cream Commonly known as: EMLA   loperamide 2 MG capsule Commonly known as: IMODIUM   metoprolol tartrate 25 MG tablet Commonly known as: LOPRESSOR   multivitamin tablet   multivitamin Tabs tablet   sertraline 100 MG tablet Commonly known as: ZOLOFT   Xtandi 40 MG capsule Generic drug: enzalutamide       TAKE these medications    acetaminophen 325 MG tablet Commonly known as: TYLENOL Take 2 tablets (650 mg total) by mouth every 6 (six) hours as needed for mild pain (or Fever >/= 101). What changed:  medication strength how much to take reasons to take this   albuterol (2.5 MG/3ML) 0.083% nebulizer solution Commonly known as: PROVENTIL Take 3 mLs (2.5 mg total) by nebulization every 4 (four) hours as needed for wheezing or shortness of breath.   glycopyrrolate 1 MG tablet Commonly known as: ROBINUL Take 1 tablet (1 mg total) by mouth every 4 (four) hours as needed (excessive secretions).   haloperidol 0.5 MG tablet Commonly known as: HALDOL Take 1 tablet (0.5 mg total) by mouth every 4 (four) hours as needed for agitation (or delirium).   HYDROmorphone 1 MG/ML injection Commonly known as: DILAUDID Inject 0.5-1 mLs (0.5-1 mg total) into the vein every hour as needed (Pain/Dyspnea).   LORazepam 2 MG/ML injection Commonly known as: ATIVAN Inject 0.25-0.5 mLs (0.5-1 mg total) into the vein every hour as needed for anxiety, sedation or seizure.        Follow-up Information     [name], MD. Schedule an appointment as soon as possible for a visit in 1 week(s).   Specialty: Family Medicine Why: As needed Contact information: [address] [place] [address] [phone]         June Leap L, DO Follow up.   Specialty: Pulmonary Disease Why: ? microaspiration, cavitary lung lesion, As needed Contact information: [address] [place] [address] [phone]                Allergies  Allergen Reactions    Gabapentin Swelling     Other Procedures/Studies: CT ABDOMEN PELVIS WO CONTRAST  Result Date: 06/25/2021 CLINICAL DATA:  Coronavirus pneumonia. Anemia. Anemia is worsening. EXAM: CT ABDOMEN AND PELVIS WITHOUT CONTRAST TECHNIQUE: Multidetector CT imaging of the abdomen and pelvis was performed following the standard protocol without IV contrast. RADIATION DOSE REDUCTION: This exam was performed according to the departmental dose-optimization program which includes automated exposure control, adjustment of the mA and/or kV according to patient size and/or use of iterative reconstruction technique. COMPARISON:  Chest CT 06/16/2021.  CT pelvis 05/19/2019 FINDINGS: Lower chest: Moderate bilateral effusions layering dependently with dependent atelectasis. Non dependent lung appears largely clear. The heart is enlarged. There is a small amount pericardial fluid. Hepatobiliary: Liver parenchyma appears normal without contrast.  No calcified gallstones. Pancreas: Normal Spleen: Normal Adrenals/Urinary Tract: Adrenal glands are normal. The kidneys are atrophic. Simple cyst on both sides. No hydronephrosis. No visible bladder abnormality. Stomach/Bowel: Stomach and small intestine are normal. No visible colon pathology. Vascular/Lymphatic: Aortic atherosclerosis. No aneurysm. IVC is normal. No adenopathy. No retroperitoneal hematoma. Reproductive: Normal Other: No free fluid or air.  No evidence of body wall hemorrhage. Musculoskeletal: Diffusely abnormal appearance of the bones, with a mixed lytic and sclerotic pattern most often associated with widespread malignancy in this patient with a history of metastatic prostate cancer. No evidence of dominant bone lesion. No definite evidence of progression when compared to a CT scan of May 19, 2019. IMPRESSION: No hemorrhage or hematoma seen to explain worsening anemia. Bilateral pleural effusions layering dependently with dependent pulmonary atelectasis. Cardiomegaly.  Aortic Atherosclerosis (ICD10-I70.0). Bilateral renal atrophy. 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 No visible progression since a study of 2020. Electronically Signed   By: Nelson Chimes M.D.   On: 06/25/2021 08:54   DG Knee 1-2 Views Left  Result Date: 06/06/2021 CLINICAL DATA:  Left knee pain EXAM: LEFT KNEE - 1-2 VIEW COMPARISON:  05/20/2019 FINDINGS: Chronic fracture distal left femur. Persistent fracture line is present however there has been progressive healing since the prior study. There is mild medial displacement. ORIF with lateral plate and screws unchanged from the prior study. Intramedullary rod also present and unchanged. Vascular calcification. Advanced degenerative change in the knee most severe in the medial joint space. IMPRESSION: Fracture distal femur shows partial healing since 2020. Mild medial displacement unchanged. ORIF hardware in satisfactory position. Electronically Signed   By: Franchot Gallo M.D.   On: 06/06/2021 16:05   CT CHEST WO CONTRAST  Result Date: 06/16/2021 CLINICAL DATA:  66 year old male with history of pneumonia. EXAM: CT CHEST WITHOUT CONTRAST TECHNIQUE: Multidetector CT imaging of the chest was performed following the standard protocol without IV contrast. RADIATION DOSE REDUCTION: This exam was performed according to the departmental dose-optimization program which includes automated exposure control, adjustment of the mA and/or kV according to patient size and/or use of iterative reconstruction technique. COMPARISON:  Chest CT 09/23/2017. FINDINGS: Cardiovascular: Heart size is mildly enlarged. There is no significant pericardial fluid, thickening or pericardial calcification. There is aortic atherosclerosis, as well as atherosclerosis of the great vessels of the mediastinum and the coronary arteries, including calcified atherosclerotic plaque in the left main, left anterior descending, left  circumflex and right coronary arteries. Calcifications of the aortic valve. Left atrial appendage occluding device noted. Mediastinum/Nodes: Mildly enlarged low right paratracheal lymph node measuring 1.7 cm in short axis (axial image 44 of series 3). No other pathologically enlarged mediastinal or hilar lymph nodes are confidently identified on today's noncontrast CT examination. Esophagus is unremarkable in appearance. No axillary lymphadenopathy. Lungs/Pleura: Diffuse ground-glass attenuation and mild interlobular septal thickening noted throughout the lungs bilaterally, suggesting a background of pulmonary edema. Moderate bilateral pleural effusions lying dependently with associated areas of passive subsegmental atelectasis in the lower lobes of the lungs bilaterally. There is a new thick-walled cavitary nodule in the right upper lobe (axial image 61 of series 4) measuring 1.4 x 1.2 cm. Upper Abdomen: Aortic atherosclerosis. Mild bilateral renal atrophy. Musculoskeletal: Diffuse sclerotic lesions noted throughout the visualized axial and appendicular skeleton, compatible with widespread metastatic disease in this patient with documented history of prostate cancer. IMPRESSION: 1. The appearance of the chest suggest congestive heart failure, as above. 2. In addition,  there is a new thick-walled cavitary nodule measuring 1.4 x 1.2 cm in the right upper lobe. This is potentially infectious or inflammatory in etiology, however, underlying neoplasm is not excluded. Short-term follow-up repeat noncontrast chest CT is recommended in 2-3 months to ensure the stability or regression of this lesion. 3. Mildly enlarged right paratracheal lymph node, nonspecific in the setting of presumed congestive heart failure, but concerning in light of the right upper lobe pulmonary nodule. Attention at time of repeat CT examination is recommended to ensure stability/regression. 4. Diffuse sclerotic lesions noted throughout the  visualized axial and appendicular skeleton, presumably reflective of widespread osseous metastasis in this patient with history of prostate cancer. 5. Aortic atherosclerosis, in addition to left main and three-vessel coronary artery disease. Please note that although the presence of coronary artery calcium documents the presence of coronary artery disease, the severity of this disease and any potential stenosis cannot be assessed on this non-gated CT examination. Assessment for potential risk factor modification, dietary therapy or pharmacologic therapy may be warranted, if clinically indicated. 6. There are calcifications of the aortic valve. Echocardiographic correlation for evaluation of potential valvular dysfunction may be warranted if clinically indicated. Aortic Atherosclerosis (ICD10-I70.0). Electronically Signed   By: Vinnie Langton M.D.   On: 06/16/2021 11:59   MR KNEE LEFT WO CONTRAST  Result Date: 06/08/2021 CLINICAL DATA:  Bacteremia. Concern for left knee infection. EXAM: MRI OF THE LEFT KNEE WITHOUT CONTRAST TECHNIQUE: Multiplanar, multisequence MR imaging of the knee was performed. No intravenous contrast was administered. COMPARISON:  Left knee x-rays dated June 06, 2021. FINDINGS: MENISCI Medial meniscus: Large radial tear of the anterior horn, body, and posterior horn. Lateral meniscus:  Small radial tear of the body. LIGAMENTS Cruciates:  Intact ACL and PCL. Collaterals: Medial collateral ligament is intact. Lateral collateral ligament complex is intact. CARTILAGE Patellofemoral:  Mild partial-thickness cartilage loss. Medial: Extensive full-thickness cartilage loss over the weight-bearing medial femoral condyle and medial tibial plateau. Lateral: Scattered partial-thickness cartilage loss. Full-thickness cartilage loss over the mesial aspect of the lateral femoral condyle. Joint:  Small joint effusion. Scarring in Hoffa's fat. Popliteal Fossa:  Tiny Baker cyst. Intact popliteus tendon.  Extensor Mechanism: Intact quadriceps tendon and patellar tendon. Intact medial and lateral patellar retinaculum. Intact MPFL. Bones: Chronic incompletely healed distal femoral metadiaphyseal fracture status post ORIF. No suspicious marrow signal abnormality. Tricompartmental marginal osteophytes. Other: None. IMPRESSION: 1. No evidence of osteomyelitis. 2. Small joint effusion, nonspecific, although favored to be related to underlying advanced tricompartmental osteoarthritis and less likely infection given lack of inflammatory changes about the knee. 3. Large radial tear of the medial meniscus anterior horn, body, and posterior horn. 4. Small radial tear of the lateral meniscus body. 5. Chronic incompletely healed distal femoral metadiaphyseal fracture status post ORIF. Electronically Signed   By: Titus Dubin M.D.   On: 06/08/2021 13:33   DG CHEST PORT 1 VIEW  Result Date: 06/16/2021 CLINICAL DATA:  Shortness of breath EXAM: PORTABLE CHEST 1 VIEW COMPARISON:  06/15/2021 FINDINGS: Cardiac shadow is enlarged. Aortic calcifications are again seen. Lungs are well aerated bilaterally. Persistent airspace opacity is again seen primarily within the right lung base similar to that seen on the prior exam. No bony abnormality is noted. IMPRESSION: Significant interval change from the prior study. Electronically Signed   By: Inez Catalina M.D.   On: 06/16/2021 03:59   DG Chest Portable 1 View  Result Date: 06/15/2021 CLINICAL DATA:  66 year old male with history of shortness of breath.  EXAM: PORTABLE CHEST 1 VIEW COMPARISON:  Chest x-ray 06/06/2021. FINDINGS: Previously noted endotracheal tube and enteric tube have been removed. Lung volumes are low. Diffuse peribronchial cuffing, widespread interstitial prominence and patchy asymmetrically distributed airspace disease is noted throughout the lungs bilaterally (right greater than left), significantly worsened compared to the prior study. Probable small bilateral  pleural effusions. No pneumothorax. Heart size is normal. The patient is rotated to the left on today's exam, resulting in distortion of the mediastinal contours and reduced diagnostic sensitivity and specificity for mediastinal pathology. Atherosclerotic calcifications are noted in the thoracic aorta. IMPRESSION: 1. Substantially worsened multilobar bilateral pneumonia. 2. Aortic atherosclerosis. Electronically Signed   By: Vinnie Langton M.D.   On: 06/15/2021 08:28   DG CHEST PORT 1 VIEW  Result Date: 06/06/2021 CLINICAL DATA:  Shortness of breath.  COVID-19 positive. EXAM: PORTABLE CHEST 1 VIEW COMPARISON:  06/04/2021 FINDINGS: Feeding tube extends beyond the inferior aspect of the film. Endotracheal tube terminates 4.7 cm above carina. Midline trachea. Cardiomegaly accentuated by AP portable technique. Atherosclerosis in the transverse aorta. No pleural effusion or pneumothorax. Bilateral, peripheral predominant interstitial opacities are improved. IMPRESSION: Improved peripheral predominant interstitial opacities, which given the clinical history, likely represent COVID-19 pneumonia. Endotracheal tube 4.7 cm above carina. Aortic Atherosclerosis (ICD10-I70.0). Cardiomegaly without congestive failure. Electronically Signed   By: Abigail Miyamoto M.D.   On: 06/06/2021 16:03   DG Abd Portable 1V  Result Date: 06/10/2021 CLINICAL DATA:  Feeding tube placement EXAM: PORTABLE ABDOMEN - 1 VIEW COMPARISON:  Portable exam 1416 hours compared to 06/09/2021 FINDINGS: Feeding tube extends into the proximal stomach. Normal bowel gas pattern. Bones demineralized. IMPRESSION: Feeding tube extends into proximal stomach. Electronically Signed   By: Lavonia Dana M.D.   On: 06/10/2021 14:36   DG Abd Portable 1V  Result Date: 06/09/2021 CLINICAL DATA:  Placement of gastrointestinal device EXAM: PORTABLE ABDOMEN - 1 VIEW COMPARISON:  06/05/2021 FINDINGS: Enteric tube tip overlies the proximal stomach, it is retracted since  06/05/2021. Probable airspace disease at the left base. IMPRESSION: Esophageal tube tip overlies the proximal stomach Electronically Signed   By: Donavan Foil M.D.   On: 06/09/2021 16:52   DG Abd Portable 1V  Result Date: 06/05/2021 CLINICAL DATA:  Encounter for feeding tube placement. EXAM: PORTABLE ABDOMEN - 1 VIEW COMPARISON:  None. FINDINGS: The bowel gas pattern is normal. Weighted tip feeding tube with distal tip in the pyloric region. No radio-opaque calculi or other significant radiographic abnormality are seen. IMPRESSION: Feeding tube in satisfactory position. Electronically Signed   By: Keane Police D.O.   On: 06/05/2021 14:52   DG Swallowing Func-Speech Pathology  Result Date: 06/27/2021 Table formatting from the original result was not included. Objective Swallowing Evaluation: Type of Study: MBS-Modified Barium Swallow Study  Patient Details Name: Richard Hall MRN: [id number] Date of Birth: 1955-12-15 Today's Date: 06/27/2021 Time: SLP Start Time (ACUTE ONLY): 1200 -SLP Stop Time (ACUTE ONLY): 4098 SLP Time Calculation (min) (ACUTE ONLY): 15 min Past Medical History: Past Medical History: Diagnosis Date  A-V fistula ([place])   left arm  Anemia   Arthritis   CHF (congestive heart failure) ([place])   CVA (cerebral vascular accident) ([place]) 2010  Diabetes mellitus with end-stage renal disease ([place])   Dyalisis pt.  Diabetic neuropathy (HCC)   Bilateral feet  Dyslipidemia   Dysrhythmia   A-fib  Essential hypertension   Hepatitis   childhood  History of blood transfusion   Hypothyroidism (acquired)   OSA on CPAP  Prostate cancer [facility])   Uses wheelchair  Past Surgical History: Past Surgical History: Procedure Laterality Date  APPENDECTOMY    COLONOSCOPY    FEMUR IM NAIL Left 05/20/2019  Procedure: Intramedullary (Im) Retrograde Femoral Nailing for impending pathologic fracture;  Surgeon: Altamese Fries, MD;  Location: [facility];  Service: Orthopedics;  Laterality: Left;  ORIF FEMUR FRACTURE Left 05/20/2019   Procedure: OPEN REDUCTION INTERNAL FIXATION (ORIF) DISTAL FEMUR FRACTURE with intercondylar extension;  Surgeon: Altamese Ziebach, MD;  Location: [facility];  Service: Orthopedics;  Laterality: Left;  TRANSURETHRAL RESECTION OF BLADDER TUMOR N/A 01/07/2019  Procedure: CYSTOSCOPY/ EVEACUATION CLOT;  Surgeon: Bjorn Loser, MD;  Location: [facility];  Service: Urology;  Laterality: N/A;  TRANSURETHRAL RESECTION OF PROSTATE N/A 07/26/2020  Procedure: TRANSURETHRAL RESECTION OF THE PROSTATE (TURP);  Surgeon: Alexis Frock, MD;  Location: [facility];  Service: Urology;  Laterality: N/A;  37 MINS HPI: 66 y.o. male with medical history significant of hypertension, atrial fibrillation, congestive heart failure, CVA, ESRD on HD, and prostate cancer. Recently d/c (admitted 12/20-1/13)from hospital to SNF and readmitted with acute respiratory failure due to overload. Patient had been seen by ST services during previous admission and at that time was tolerating a Dys 2, thin liquids diet; ST continuing to f/u for diet progression.  Subjective: awake and alert but appears confused and apathetic  Recommendations for follow up therapy are one component of a multi-disciplinary discharge planning process, led by the attending physician.  Recommendations may be updated based on patient status, additional functional criteria and insurance authorization. Assessment / Plan / Recommendation Clinical Impressions 06/27/2021 Clinical Impression Pt exhibits a suspected primary esophageal dysphagia. Transit time with solid affected by missing dentition rather than impairment. Thin liquid was penetrated and remained in vestibule x 1 and minimal vallecular residue both of which are normal and typical findings. His esophagus was scanned revealing hesitation and slow transit with pill with statis ascending esophagus. Compensatory strategies were discussed with pt to mitigate risks. Recommend he continue Dys 3, thin liquid, straws allowed, single pill with  thin and remain upright 45 min after meals. Will follow up for esophageal education x 1. SLP Visit Diagnosis (No Data) Attention and concentration deficit following -- Frontal lobe and executive function deficit following -- Impact on safety and function Mild aspiration risk   Treatment Recommendations 06/27/2021 Treatment Recommendations Therapy as outlined in treatment plan below   Prognosis 06/27/2021 Prognosis for Safe Diet Advancement Good Barriers to Reach Goals -- Barriers/Prognosis Comment -- Diet Recommendations 06/27/2021 SLP Diet Recommendations Dysphagia 3 (Mech soft) solids;Thin liquid Liquid Administration via Cup;Straw Medication Administration Whole meds with liquid Compensations Slow rate;Small sips/bites Postural Changes Seated upright at 90 degrees;Remain semi-upright after after feeds/meals (Comment)   Other Recommendations 06/27/2021 Recommended Consults Consider esophageal assessment Oral Care Recommendations Oral care BID Other Recommendations -- Follow Up Recommendations No SLP follow up Assistance recommended at discharge None Functional Status Assessment -- Frequency and Duration  06/27/2021 Speech Therapy Frequency (ACUTE ONLY) min 1 x/week Treatment Duration 1 week   Oral Phase 06/27/2021 Oral Phase WFL Oral - Pudding Teaspoon -- Oral - Pudding Cup -- Oral - Honey Teaspoon -- Oral - Honey Cup -- Oral - Nectar Teaspoon -- Oral - Nectar Cup -- Oral - Nectar Straw -- Oral - Thin Teaspoon -- Oral - Thin Cup -- Oral - Thin Straw -- Oral - Puree -- Oral - Mech Soft -- Oral - Regular -- Oral - Multi-Consistency -- Oral - Pill -- Oral Phase - Comment --  Pharyngeal Phase 06/27/2021 Pharyngeal Phase WFL Pharyngeal- Pudding Teaspoon -- Pharyngeal -- Pharyngeal- Pudding Cup -- Pharyngeal -- Pharyngeal- Honey Teaspoon -- Pharyngeal -- Pharyngeal- Honey Cup -- Pharyngeal -- Pharyngeal- Nectar Teaspoon -- Pharyngeal -- Pharyngeal- Nectar Cup -- Pharyngeal -- Pharyngeal- Nectar Straw -- Pharyngeal --  Pharyngeal- Thin Teaspoon -- Pharyngeal -- Pharyngeal- Thin Cup -- Pharyngeal -- Pharyngeal- Thin Straw -- Pharyngeal -- Pharyngeal- Puree -- Pharyngeal -- Pharyngeal- Mechanical Soft -- Pharyngeal -- Pharyngeal- Regular -- Pharyngeal -- Pharyngeal- Multi-consistency -- Pharyngeal -- Pharyngeal- Pill -- Pharyngeal -- Pharyngeal Comment --  Cervical Esophageal Phase  06/27/2021 Cervical Esophageal Phase WFL Pudding Teaspoon -- Pudding Cup -- Honey Teaspoon -- Honey Cup -- Nectar Teaspoon -- Nectar Cup -- Nectar Straw -- Thin Teaspoon -- Thin Cup -- Thin Straw -- Puree -- Mechanical Soft -- Regular -- Multi-consistency -- Pill -- Cervical Esophageal Comment -- [place] Siren 06/27/2021, 1:11 PM                     ECHO TEE  Result Date: 07/04/2021    TRANSESOPHOGEAL ECHO REPORT   Patient Name:   NAIF RAY Deweese Date of Exam: 06/09/2021 Medical Rec #:  [id number]        Height:       72.0 in Accession #:    [id number]       Weight:       264.6 lb Date of Birth:  03/11/1956         BSA:          2.400 m Patient Age:    19 years         BP:           114/57 mmHg Patient Gender: M                HR:           108 bpm. Exam Location:  Inpatient Procedure: Transesophageal Echo Indications:     Bacteremia  History:         Patient has prior history of Echocardiogram examinations, most                  recent 06/01/2021. Mitral Valve Disease. COVID-19.  Sonographer:     Clayton Lefort RDCS (AE) Referring Phys:  [id number] Shaune Pascal BENSIMHON Diagnosing Phys: Glori Bickers MD PROCEDURE: After discussion of the risks and benefits of a TEE, an informed consent was obtained from a family member. The transesophogeal probe was passed without difficulty through the esophogus of the patient. Sedation performed by performing physician. Patients was under conscious sedation during this procedure. Anesthetic administered: 111mcg of Fentanyl, 6mg  of Versed. Image quality was good. The patient developed no complications during the procedure.  IMPRESSIONS  1. Left ventricular ejection fraction, by estimation, is 60 to 65%. The left ventricle has normal function.  2. Right ventricular systolic function is normal. The right ventricular size is normal.  3. LAA occluded with Watchman. Device well seated. No thrombus or vegetation.. Left atrial size was moderately dilated. No left atrial/left atrial appendage thrombus was detected.  4. Right atrial size was moderately dilated.  5. No vegetation. No systolic flow reversal in pulmonary veins . The mitral valve is normal in structure. Moderate to severe mitral valve regurgitation. No evidence of mitral stenosis. Moderate mitral annular calcification.  6. No vegetation.  7. No vegetation. The aortic valve is tricuspid. There is moderate calcification of the aortic valve. Aortic valve regurgitation is trivial. No  aortic stenosis is present.  8. There is mild (Grade II) plaque involving the descending aorta.  9. No vegetation. FINDINGS  Left Ventricle: Left ventricular ejection fraction, by estimation, is 60 to 65%. The left ventricle has normal function. The left ventricular internal cavity size was normal in size. Right Ventricle: The right ventricular size is normal. No increase in right ventricular wall thickness. Right ventricular systolic function is normal. Left Atrium: LAA occluded with Watchman. Device well seated. No thrombus or vegetation. Left atrial size was moderately dilated. No left atrial/left atrial appendage thrombus was detected. Right Atrium: Right atrial size was moderately dilated. Pericardium: There is no evidence of pericardial effusion. Mitral Valve: No vegetation. No systolic flow reversal in pulmonary veins. The mitral valve is normal in structure. Moderate mitral annular calcification. Moderate to severe mitral valve regurgitation, with centrally-directed jet. No evidence of mitral valve stenosis. MV peak gradient, 8.0 mmHg. The mean mitral valve gradient is 4.0 mmHg. Tricuspid Valve: No  vegetation. The tricuspid valve is normal in structure. Tricuspid valve regurgitation is mild. Aortic Valve: No vegetation. The aortic valve is tricuspid. There is moderate calcification of the aortic valve. Aortic valve regurgitation is trivial. No aortic stenosis is present. Pulmonic Valve: The pulmonic valve was normal in structure. Pulmonic valve regurgitation is trivial. Aorta: The aortic root is normal in size and structure. There is mild (Grade II) plaque involving the descending aorta. Pulmonary Artery: No vegetation. IAS/Shunts: The interatrial septum was not well visualized.  MITRAL VALVE MV Peak grad: 8.0 mmHg MV Mean grad: 4.0 mmHg MV Vmax:      1.41 m/s MV Vmean:     87.8 cm/s Glori Bickers MD Electronically signed by Glori Bickers MD Signature Date/Time: 07/04/2021/6:09:11 PM    Final    CT MAXILLOFACIAL WO CONTRAST  Result Date: 06/06/2021 CLINICAL DATA:  dental carries EXAM: CT MAXILLOFACIAL WITHOUT CONTRAST TECHNIQUE: Multidetector CT imaging of the maxillofacial structures was performed. Multiplanar CT image reconstructions were also generated. COMPARISON:  CT head 06/04/2021. FINDINGS: Osseous: No fracture or mandibular dislocation. No destructive process. Dental: No maxillary teeth. Small periapical lucency of the left medial mandibular incisor. Small dental caries, including of the incisors. Orbits: Remote appearing right medial orbital wall fracture. Sinuses: Mild mucosal thickening of the inferior left maxillary sinus and scattered ethmoid air cells. No air-fluid levels. Soft tissues: Negative. Limited intracranial: Better characterized on recent CT head from 06/04/2021. IMPRESSION: 1. Remote appearing right medial orbital wall fracture. 2. Missing maxillary teeth and small dental caries, as above. No evidence of surrounding inflammatory change or abscess. Electronically Signed   By: Margaretha Sheffield M.D.   On: 06/06/2021 12:41     TODAY-DAY OF DISCHARGE:  Subjective:   Richard Hall today has no headache,no chest abdominal pain,no new weakness tingling or numbness, feels much better wants to go home today.   Objective:   Blood pressure 105/65, pulse 67, temperature 98 F (36.7 C), temperature source Oral, resp. rate 16, height 6' (1.829 m), weight 122.7 kg, SpO2 100 %. No intake or output data in the 24 hours ending 07/05/21 1050 Filed Weights   07/01/21 0912 07/01/21 1310 07/02/21 0545  Weight: 118.3 kg 115.1 kg 122.7 kg    Exam: Awake Alert, Oriented *3, No new F.N deficits, Normal affect [place].AT,PERRAL Supple Neck,No JVD, No cervical lymphadenopathy appriciated.  Symmetrical Chest wall movement, Good air movement bilaterally, CTAB RRR,No Gallops,Rubs or new Murmurs, No Parasternal Heave +ve B.Sounds, Abd Soft, Non tender, No organomegaly appriciated, No rebound -  guarding or rigidity. No Cyanosis, Clubbing or edema, No new Rash or bruise   PERTINENT RADIOLOGIC STUDIES: No results found.   PERTINENT LAB RESULTS: CBC: No results for input(s): WBC, HGB, HCT, PLT in the last 72 hours. CMET CMP     Component Value Date/Time   NA 124 (L) 07/02/2021 0313   K 4.2 07/02/2021 0313   CL 89 (L) 07/02/2021 0313   CO2 26 07/02/2021 0313   GLUCOSE 95 07/02/2021 0313   BUN 38 (H) 07/02/2021 0313   CREATININE 5.02 (H) 07/02/2021 0313   CALCIUM 7.9 (L) 07/02/2021 0313   PROT 6.6 06/21/2021 0120   ALBUMIN 1.8 (L) 07/02/2021 0313   AST 29 06/21/2021 0120   ALT 5 06/21/2021 0120   ALKPHOS 120 06/21/2021 0120   BILITOT 0.7 06/21/2021 0120   GFRNONAA 12 (L) 07/02/2021 0313   GFRAA 16 (L) 05/25/2019 0213    GFR Estimated Creatinine Clearance: 19.6 mL/min (A) (by C-G formula based on SCr of 5.02 mg/dL (H)). No results for input(s): LIPASE, AMYLASE in the last 72 hours. No results for input(s): CKTOTAL, CKMB, CKMBINDEX, TROPONINI in the last 72 hours. Invalid input(s): POCBNP No results for input(s): DDIMER in the last 72 hours. No results for input(s):  HGBA1C in the last 72 hours. No results for input(s): CHOL, HDL, LDLCALC, TRIG, CHOLHDL, LDLDIRECT in the last 72 hours. No results for input(s): TSH, T4TOTAL, T3FREE, THYROIDAB in the last 72 hours.  Invalid input(s): FREET3 No results for input(s): VITAMINB12, FOLATE, FERRITIN, TIBC, IRON, RETICCTPCT in the last 72 hours. Coags: No results for input(s): INR in the last 72 hours.  Invalid input(s): PT Microbiology: Recent Results (from the past 240 hour(s))  Resp Panel by RT-PCR (Flu A&B, Covid) Nasopharyngeal Swab     Status: None   Collection Time: 07/02/21 10:45 AM   Specimen: Nasopharyngeal Swab; Nasopharyngeal(NP) swabs in vial transport medium  Result Value Ref Range Status   SARS Coronavirus 2 by RT PCR NEGATIVE NEGATIVE Final    Comment: (NOTE) SARS-CoV-2 target nucleic acids are NOT DETECTED.  The SARS-CoV-2 RNA is generally detectable in upper respiratory specimens during the acute phase of infection. The lowest concentration of SARS-CoV-2 viral copies this assay can detect is 138 copies/mL. A negative result does not preclude SARS-Cov-2 infection and should not be used as the sole basis for treatment or other patient management decisions. A negative result may occur with  improper specimen collection/handling, submission of specimen other than nasopharyngeal swab, presence of viral mutation(s) within the areas targeted by this assay, and inadequate number of viral copies(<138 copies/mL). A negative result must be combined with clinical observations, patient history, and epidemiological information. The expected result is Negative.  Fact Sheet for Patients:  EntrepreneurPulse.com.au  Fact Sheet for Healthcare Providers:  IncredibleEmployment.be  This test is no t yet approved or cleared by the [place] FDA and  has been authorized for detection and/or diagnosis of SARS-CoV-2 by FDA under an Emergency Use Authorization (EUA).  This EUA will remain  in effect (meaning this test can be used) for the duration of the COVID-19 declaration under Section 564(b)(1) of the Act, 21 U.S.C.section 360bbb-3(b)(1), unless the authorization is terminated  or revoked sooner.       Influenza A by PCR NEGATIVE NEGATIVE Final   Influenza B by PCR NEGATIVE NEGATIVE Final    Comment: (NOTE) The Xpert Xpress SARS-CoV-2/FLU/RSV plus assay is intended as an aid in the diagnosis of influenza from Nasopharyngeal swab specimens and should  not be used as a sole basis for treatment. Nasal washings and aspirates are unacceptable for Xpert Xpress SARS-CoV-2/FLU/RSV testing.  Fact Sheet for Patients: EntrepreneurPulse.com.au  Fact Sheet for Healthcare Providers: IncredibleEmployment.be  This test is not yet approved or cleared by the [place] FDA and has been authorized for detection and/or diagnosis of SARS-CoV-2 by FDA under an Emergency Use Authorization (EUA). This EUA will remain in effect (meaning this test can be used) for the duration of the COVID-19 declaration under Section 564(b)(1) of the Act, 21 U.S.C. section 360bbb-3(b)(1), unless the authorization is terminated or revoked.  Performed at [facility], [place] [address], [place], [place] [address]     FURTHER DISCHARGE INSTRUCTIONS:  Get Medicines reviewed and adjusted: Please take all your medications with you for your next visit with your Primary MD  Laboratory/radiological data: Please request your Primary MD to go over all hospital tests and procedure/radiological results at the follow up, please ask your Primary MD to get all Hospital records sent to his/her office.  In some cases, they will be blood work, cultures and biopsy results pending at the time of your discharge. Please request that your primary care M.D. goes through all the records of your hospital data and follows up on these results.  Also Note the  following: If you experience worsening of your admission symptoms, develop shortness of breath, life threatening emergency, suicidal or homicidal thoughts you must seek medical attention immediately by calling 911 or calling your MD immediately  if symptoms less severe.  You must read complete instructions/literature along with all the possible adverse reactions/side effects for all the Medicines you take and that have been prescribed to you. Take any new Medicines after you have completely understood and accpet all the possible adverse reactions/side effects.   Do not drive when taking Pain medications or sleeping medications (Benzodaizepines)  Do not take more than prescribed Pain, Sleep and Anxiety Medications. It is not advisable to combine anxiety,sleep and pain medications without talking with your primary care practitioner  Special Instructions: If you have smoked or chewed Tobacco  in the last 2 yrs please stop smoking, stop any regular Alcohol  and or any Recreational drug use.  Wear Seat belts while driving.  Please note: You were cared for by a hospitalist during your hospital stay. Once you are discharged, your primary care physician will handle any further medical issues. Please note that NO REFILLS for any discharge medications will be authorized once you are discharged, as it is imperative that you return to your primary care physician (or establish a relationship with a primary care physician if you do not have one) for your post hospital discharge needs so that they can reassess your need for medications and monitor your lab values.  Total Time spent coordinating discharge including counseling, education and face to face time equals less than 30 minutes.  Signed: Tieler Cournoyer 07/05/2021 10:50 AM

## 2021-07-05 NOTE — Progress Notes (Signed)
° °  Met with pt and his son [name] at bedside to review hospice services and comfort care approach at he Hospice home in [place]. They are in agreement and the pt's son completed paperwork for the transition to the Hospice Home in [facility].   Notified Percell Locus on the floor and she will update MD for d/c plan as well as arrange the ambulance for transfer with [facility] for around [place] RN [phone]

## 2021-07-30 ENCOUNTER — Telehealth: Payer: Self-pay

## 2021-07-30 ENCOUNTER — Institutional Professional Consult (permissible substitution): Payer: Medicare Other | Admitting: Emergency Medicine

## 2021-07-30 NOTE — Telephone Encounter (Signed)
Pt needs to be rescheduled for consult. Pt can see any provider in practice

## 2021-07-31 NOTE — Telephone Encounter (Signed)
Attempted to contact patient to reschedule HFU/ Consult- no voicemail available. Will try again later.  ?

## 2021-07-31 DEATH — deceased

## 2021-08-06 NOTE — Telephone Encounter (Signed)
Attempted to contact patient to reschedule HFU/ Consult- no voicemail available. Mailed letter to patient to call the office if he wishes to reschedule this appointment. ?

## 2021-10-07 NOTE — Telephone Encounter (Signed)
Letter was returned to sender. Patient marked as "return mail" in hopes we can update the address. ?
